# Patient Record
Sex: Male | Born: 1940 | Race: Black or African American | Hispanic: No | Smoking: Former smoker
Health system: Southern US, Community
[De-identification: ages and names within clinical notes are randomized; demographics above are authoritative.]

## PROBLEM LIST (undated history)

## (undated) DIAGNOSIS — E119 Type 2 diabetes mellitus without complications: Secondary | ICD-10-CM

## (undated) DIAGNOSIS — H401190 Primary open-angle glaucoma, unspecified eye, stage unspecified: Secondary | ICD-10-CM

## (undated) DIAGNOSIS — I639 Cerebral infarction, unspecified: Secondary | ICD-10-CM

## (undated) DIAGNOSIS — I10 Essential (primary) hypertension: Secondary | ICD-10-CM

## (undated) DIAGNOSIS — E785 Hyperlipidemia, unspecified: Secondary | ICD-10-CM

## (undated) DIAGNOSIS — N189 Chronic kidney disease, unspecified: Secondary | ICD-10-CM

## (undated) DIAGNOSIS — I4510 Unspecified right bundle-branch block: Secondary | ICD-10-CM

## (undated) DIAGNOSIS — N289 Disorder of kidney and ureter, unspecified: Secondary | ICD-10-CM

## (undated) HISTORY — DX: Chronic kidney disease, unspecified: N18.9

## (undated) HISTORY — DX: Unspecified right bundle-branch block: I45.10

## (undated) HISTORY — DX: Primary open-angle glaucoma, unspecified eye, stage unspecified: H40.1190

## (undated) HISTORY — DX: Hyperlipidemia, unspecified: E78.5

## (undated) HISTORY — DX: Essential (primary) hypertension: I10

## (undated) HISTORY — PX: EYE SURGERY: SHX253

## (undated) HISTORY — DX: Disorder of kidney and ureter, unspecified: N28.9

## (undated) HISTORY — DX: Type 2 diabetes mellitus without complications: E11.9

## (undated) HISTORY — DX: Cerebral infarction, unspecified: I63.9

---

## 1969-07-19 HISTORY — PX: OTHER SURGICAL HISTORY: SHX169

## 1990-11-18 DIAGNOSIS — E119 Type 2 diabetes mellitus without complications: Secondary | ICD-10-CM

## 1990-11-18 HISTORY — DX: Type 2 diabetes mellitus without complications: E11.9

## 1995-11-19 DIAGNOSIS — I639 Cerebral infarction, unspecified: Secondary | ICD-10-CM

## 1995-11-19 HISTORY — DX: Cerebral infarction, unspecified: I63.9

## 1998-10-18 ENCOUNTER — Encounter: Payer: Self-pay | Admitting: Family Medicine

## 1998-10-18 DIAGNOSIS — I10 Essential (primary) hypertension: Secondary | ICD-10-CM

## 1998-10-18 DIAGNOSIS — E785 Hyperlipidemia, unspecified: Secondary | ICD-10-CM

## 1998-10-18 HISTORY — DX: Essential (primary) hypertension: I10

## 1998-10-18 HISTORY — DX: Hyperlipidemia, unspecified: E78.5

## 1998-10-18 LAB — CONVERTED CEMR LAB
Hgb A1c MFr Bld: 8.8 %
Microalbumin U total vol: 7.8 mg/L

## 1998-12-24 ENCOUNTER — Encounter: Payer: Self-pay | Admitting: Emergency Medicine

## 1998-12-24 ENCOUNTER — Emergency Department (HOSPITAL_COMMUNITY): Admission: EM | Admit: 1998-12-24 | Discharge: 1998-12-24 | Payer: Self-pay | Admitting: Emergency Medicine

## 1999-02-17 ENCOUNTER — Encounter: Payer: Self-pay | Admitting: Family Medicine

## 1999-04-19 ENCOUNTER — Encounter: Payer: Self-pay | Admitting: Family Medicine

## 1999-04-19 LAB — CONVERTED CEMR LAB: Hgb A1c MFr Bld: 7 %

## 1999-09-19 ENCOUNTER — Encounter: Payer: Self-pay | Admitting: Family Medicine

## 1999-12-20 ENCOUNTER — Encounter: Payer: Self-pay | Admitting: Family Medicine

## 2000-01-07 ENCOUNTER — Encounter: Payer: Self-pay | Admitting: Family Medicine

## 2000-01-07 LAB — CONVERTED CEMR LAB: PSA: 2.1 ng/mL

## 2000-09-11 ENCOUNTER — Encounter: Payer: Self-pay | Admitting: Family Medicine

## 2000-09-11 LAB — CONVERTED CEMR LAB: Hgb A1c MFr Bld: 7.2 %

## 2001-03-24 ENCOUNTER — Encounter: Payer: Self-pay | Admitting: Family Medicine

## 2001-03-24 LAB — CONVERTED CEMR LAB
Hgb A1c MFr Bld: 9 %
PSA: 1.1 ng/mL

## 2001-03-26 ENCOUNTER — Encounter: Payer: Self-pay | Admitting: Family Medicine

## 2001-03-26 LAB — CONVERTED CEMR LAB: Microalbumin U total vol: 11.5 mg/L

## 2001-05-07 ENCOUNTER — Encounter: Payer: Self-pay | Admitting: Family Medicine

## 2001-05-07 LAB — CONVERTED CEMR LAB: Hgb A1c MFr Bld: 8 %

## 2001-08-06 ENCOUNTER — Encounter: Payer: Self-pay | Admitting: Family Medicine

## 2002-01-21 ENCOUNTER — Encounter: Payer: Self-pay | Admitting: Family Medicine

## 2002-01-21 LAB — CONVERTED CEMR LAB: Hgb A1c MFr Bld: 5.9 %

## 2002-04-08 ENCOUNTER — Encounter: Payer: Self-pay | Admitting: Family Medicine

## 2002-04-08 LAB — CONVERTED CEMR LAB
Hgb A1c MFr Bld: 6 %
Microalbumin U total vol: 10.5 mg/L
PSA: 1.6 ng/mL

## 2002-10-11 ENCOUNTER — Encounter: Payer: Self-pay | Admitting: Family Medicine

## 2003-08-16 ENCOUNTER — Encounter: Payer: Self-pay | Admitting: Family Medicine

## 2004-04-11 ENCOUNTER — Encounter: Payer: Self-pay | Admitting: Family Medicine

## 2004-04-11 LAB — CONVERTED CEMR LAB: Hgb A1c MFr Bld: 8 %

## 2004-10-03 ENCOUNTER — Ambulatory Visit: Payer: Self-pay | Admitting: Family Medicine

## 2004-10-03 LAB — CONVERTED CEMR LAB: Hgb A1c MFr Bld: 11.5 %

## 2004-10-04 ENCOUNTER — Encounter: Payer: Self-pay | Admitting: Family Medicine

## 2004-10-04 LAB — CONVERTED CEMR LAB: PSA: 1.7 ng/mL

## 2006-07-30 ENCOUNTER — Ambulatory Visit: Payer: Self-pay | Admitting: Family Medicine

## 2006-07-30 LAB — CONVERTED CEMR LAB: Microalbumin U total vol: 14 mg/L

## 2006-07-31 ENCOUNTER — Encounter: Payer: Self-pay | Admitting: Family Medicine

## 2006-07-31 LAB — CONVERTED CEMR LAB
Hgb A1c MFr Bld: 8.1 %
PSA: 3.4 ng/mL

## 2007-02-04 ENCOUNTER — Ambulatory Visit: Payer: Self-pay | Admitting: Family Medicine

## 2007-02-04 LAB — CONVERTED CEMR LAB: Hgb A1c MFr Bld: 9.4 %

## 2007-03-06 ENCOUNTER — Ambulatory Visit: Payer: Self-pay | Admitting: Family Medicine

## 2007-05-04 ENCOUNTER — Encounter: Payer: Self-pay | Admitting: Family Medicine

## 2007-05-04 ENCOUNTER — Ambulatory Visit: Payer: Self-pay | Admitting: Family Medicine

## 2007-05-04 DIAGNOSIS — N3946 Mixed incontinence: Secondary | ICD-10-CM

## 2007-05-04 DIAGNOSIS — E118 Type 2 diabetes mellitus with unspecified complications: Secondary | ICD-10-CM

## 2007-05-04 DIAGNOSIS — E785 Hyperlipidemia, unspecified: Secondary | ICD-10-CM

## 2007-05-04 DIAGNOSIS — I10 Essential (primary) hypertension: Secondary | ICD-10-CM | POA: Insufficient documentation

## 2007-05-04 DIAGNOSIS — Z87891 Personal history of nicotine dependence: Secondary | ICD-10-CM | POA: Insufficient documentation

## 2007-05-04 DIAGNOSIS — E1165 Type 2 diabetes mellitus with hyperglycemia: Secondary | ICD-10-CM

## 2007-05-05 ENCOUNTER — Encounter: Payer: Self-pay | Admitting: Family Medicine

## 2007-05-06 ENCOUNTER — Ambulatory Visit: Payer: Self-pay | Admitting: Family Medicine

## 2007-08-03 ENCOUNTER — Encounter (INDEPENDENT_AMBULATORY_CARE_PROVIDER_SITE_OTHER): Payer: Self-pay | Admitting: *Deleted

## 2007-08-25 ENCOUNTER — Ambulatory Visit: Payer: Self-pay | Admitting: Family Medicine

## 2007-08-27 ENCOUNTER — Ambulatory Visit: Payer: Self-pay | Admitting: Family Medicine

## 2007-10-28 ENCOUNTER — Ambulatory Visit: Payer: Self-pay | Admitting: Family Medicine

## 2007-10-28 LAB — CONVERTED CEMR LAB
ALT: 39 units/L (ref 0–53)
AST: 32 units/L (ref 0–37)

## 2007-12-30 ENCOUNTER — Ambulatory Visit: Payer: Self-pay | Admitting: Family Medicine

## 2007-12-30 LAB — CONVERTED CEMR LAB
ALT: 39 units/L (ref 0–53)
AST: 37 units/L (ref 0–37)
Cholesterol: 118 mg/dL (ref 0–200)
Total CHOL/HDL Ratio: 2.6

## 2008-01-04 ENCOUNTER — Ambulatory Visit: Payer: Self-pay | Admitting: Family Medicine

## 2008-01-04 DIAGNOSIS — H6120 Impacted cerumen, unspecified ear: Secondary | ICD-10-CM

## 2008-04-20 ENCOUNTER — Telehealth: Payer: Self-pay | Admitting: Family Medicine

## 2008-08-29 ENCOUNTER — Ambulatory Visit: Payer: Self-pay | Admitting: Family Medicine

## 2008-08-30 LAB — CONVERTED CEMR LAB
AST: 32 units/L (ref 0–37)
Albumin: 3.4 g/dL — ABNORMAL LOW (ref 3.5–5.2)
Alkaline Phosphatase: 165 units/L — ABNORMAL HIGH (ref 39–117)
BUN: 14 mg/dL (ref 6–23)
Basophils Absolute: 0.1 10*3/uL (ref 0.0–0.1)
Bilirubin, Direct: 0.1 mg/dL (ref 0.0–0.3)
Chloride: 103 meq/L (ref 96–112)
Eosinophils Absolute: 0.3 10*3/uL (ref 0.0–0.7)
Eosinophils Relative: 4.3 % (ref 0.0–5.0)
GFR calc Af Amer: 86 mL/min
GFR calc non Af Amer: 71 mL/min
HCT: 47.1 % (ref 39.0–52.0)
HDL: 43.9 mg/dL (ref >39.0)
Hgb A1c MFr Bld: 7.5 % — ABNORMAL HIGH (ref 4.6–6.0)
MCHC: 33.3 g/dL (ref 30.0–36.0)
MCV: 90.6 fL (ref 78.0–100.0)
Microalb Creat Ratio: 43.1 mg/g — ABNORMAL HIGH (ref 0.0–30.0)
Monocytes Absolute: 0.9 10*3/uL (ref 0.1–1.0)
Neutrophils Relative %: 47.9 % (ref 43.0–77.0)
PSA: 1.99 ng/mL (ref 0.10–4.00)
Platelets: 208 10*3/uL (ref 150–400)
Potassium: 3.4 meq/L — ABNORMAL LOW (ref 3.5–5.1)
RDW: 15.7 % — ABNORMAL HIGH (ref 11.5–14.6)
Sodium: 143 meq/L (ref 135–145)
Total Bilirubin: 1 mg/dL (ref 0.3–1.2)
Triglycerides: 50 mg/dL (ref 0–149)
VLDL: 10 mg/dL (ref 0–40)
WBC: 6.4 10*3/uL (ref 4.5–10.5)

## 2008-09-05 ENCOUNTER — Ambulatory Visit: Payer: Self-pay | Admitting: Family Medicine

## 2008-11-02 ENCOUNTER — Telehealth: Payer: Self-pay | Admitting: Family Medicine

## 2009-01-26 ENCOUNTER — Ambulatory Visit: Payer: Self-pay | Admitting: Family Medicine

## 2009-01-30 LAB — CONVERTED CEMR LAB
ALT: 31 units/L (ref 0–53)
AST: 34 units/L (ref 0–37)
Alkaline Phosphatase: 160 units/L — ABNORMAL HIGH (ref 39–117)
Basophils Absolute: 0 10*3/uL (ref 0.0–0.1)
CO2: 30 meq/L (ref 19–32)
Chloride: 104 meq/L (ref 96–112)
Eosinophils Absolute: 0.2 10*3/uL (ref 0.0–0.7)
Eosinophils Relative: 3.9 % (ref 0.0–5.0)
GFR calc non Af Amer: 59 mL/min
Lymphocytes Relative: 27.8 % (ref 12.0–46.0)
MCHC: 34 g/dL (ref 30.0–36.0)
MCV: 87.9 fL (ref 78.0–100.0)
Neutrophils Relative %: 57 % (ref 43.0–77.0)
Platelets: 203 10*3/uL (ref 150–400)
Potassium: 3.4 meq/L — ABNORMAL LOW (ref 3.5–5.1)
RBC: 5.16 M/uL (ref 4.22–5.81)
Total Bilirubin: 1 mg/dL (ref 0.3–1.2)
WBC: 6.2 10*3/uL (ref 4.5–10.5)

## 2009-03-01 ENCOUNTER — Ambulatory Visit: Payer: Self-pay | Admitting: Family Medicine

## 2009-03-01 LAB — CONVERTED CEMR LAB
CO2: 30 meq/L (ref 19–32)
Chloride: 103 meq/L (ref 96–112)
Glucose, Bld: 165 mg/dL — ABNORMAL HIGH (ref 70–99)
Potassium: 3.2 meq/L — ABNORMAL LOW (ref 3.5–5.1)
Sodium: 140 meq/L (ref 135–145)

## 2009-03-07 ENCOUNTER — Ambulatory Visit: Payer: Self-pay | Admitting: Family Medicine

## 2009-03-29 ENCOUNTER — Telehealth: Payer: Self-pay | Admitting: Family Medicine

## 2009-03-30 ENCOUNTER — Ambulatory Visit: Payer: Self-pay | Admitting: Family Medicine

## 2009-03-30 DIAGNOSIS — K219 Gastro-esophageal reflux disease without esophagitis: Secondary | ICD-10-CM

## 2009-04-06 ENCOUNTER — Ambulatory Visit: Payer: Self-pay | Admitting: Family Medicine

## 2009-04-06 LAB — CONVERTED CEMR LAB
BUN: 14 mg/dL (ref 6–23)
CO2: 31 meq/L (ref 19–32)
Chloride: 101 meq/L (ref 96–112)
Glucose, Bld: 116 mg/dL — ABNORMAL HIGH (ref 70–99)
Potassium: 3.3 meq/L — ABNORMAL LOW (ref 3.5–5.1)

## 2009-04-13 ENCOUNTER — Ambulatory Visit: Payer: Self-pay | Admitting: Family Medicine

## 2009-04-13 DIAGNOSIS — N289 Disorder of kidney and ureter, unspecified: Secondary | ICD-10-CM | POA: Insufficient documentation

## 2009-04-25 ENCOUNTER — Telehealth: Payer: Self-pay | Admitting: Family Medicine

## 2009-06-23 ENCOUNTER — Ambulatory Visit: Payer: Self-pay | Admitting: Family Medicine

## 2009-06-23 ENCOUNTER — Encounter: Admission: RE | Admit: 2009-06-23 | Discharge: 2009-06-23 | Payer: Self-pay | Admitting: Family Medicine

## 2009-07-06 ENCOUNTER — Telehealth: Payer: Self-pay | Admitting: Family Medicine

## 2009-07-26 ENCOUNTER — Ambulatory Visit: Payer: Self-pay | Admitting: Family Medicine

## 2009-09-04 ENCOUNTER — Ambulatory Visit: Payer: Self-pay | Admitting: Family Medicine

## 2009-09-04 LAB — CONVERTED CEMR LAB
BUN: 16 mg/dL (ref 6–23)
CO2: 28 meq/L (ref 19–32)
Calcium: 8.8 mg/dL (ref 8.4–10.5)
Creatinine, Ser: 1.3 mg/dL (ref 0.4–1.5)
Glucose, Bld: 131 mg/dL — ABNORMAL HIGH (ref 70–99)
Sodium: 143 meq/L (ref 135–145)

## 2009-09-06 ENCOUNTER — Ambulatory Visit: Payer: Self-pay | Admitting: Family Medicine

## 2010-01-22 ENCOUNTER — Ambulatory Visit: Payer: Self-pay | Admitting: Family Medicine

## 2010-01-23 ENCOUNTER — Telehealth: Payer: Self-pay | Admitting: Family Medicine

## 2010-01-23 ENCOUNTER — Emergency Department (HOSPITAL_COMMUNITY): Admission: EM | Admit: 2010-01-23 | Discharge: 2010-01-23 | Payer: Self-pay | Admitting: Emergency Medicine

## 2010-01-25 ENCOUNTER — Telehealth: Payer: Self-pay | Admitting: Family Medicine

## 2010-02-28 ENCOUNTER — Encounter (INDEPENDENT_AMBULATORY_CARE_PROVIDER_SITE_OTHER): Payer: Self-pay | Admitting: *Deleted

## 2010-03-06 ENCOUNTER — Ambulatory Visit: Payer: Self-pay | Admitting: Family Medicine

## 2010-06-19 ENCOUNTER — Ambulatory Visit: Payer: Self-pay | Admitting: Family Medicine

## 2010-06-21 ENCOUNTER — Ambulatory Visit: Payer: Self-pay | Admitting: Family Medicine

## 2010-06-22 LAB — CONVERTED CEMR LAB
BUN: 17 mg/dL (ref 6–23)
Bilirubin, Direct: 0.2 mg/dL (ref 0.0–0.3)
Chloride: 101 meq/L (ref 96–112)
Cholesterol: 121 mg/dL (ref 0–200)
LDL Cholesterol: 61 mg/dL (ref 0–99)
Potassium: 3.9 meq/L (ref 3.5–5.1)
Total CHOL/HDL Ratio: 2
Total Protein: 6.9 g/dL (ref 6.0–8.3)
VLDL: 6.6 mg/dL (ref 0.0–40.0)

## 2010-09-10 ENCOUNTER — Ambulatory Visit: Payer: Self-pay | Admitting: Family Medicine

## 2010-09-10 DIAGNOSIS — B351 Tinea unguium: Secondary | ICD-10-CM

## 2010-09-10 LAB — CONVERTED CEMR LAB: PSA: 2.53 ng/mL (ref 0.10–4.00)

## 2010-12-18 NOTE — Letter (Signed)
Summary: [place] No Show Letter  [place] [facility]  [address] [place], [place] [address]   Phone: [phone]  Fax: [phone]    02/28/2010 MRN: [id number]  JASH WAHLEN [address] [place], [place]  [address]   Dear Mr. VANWAGONER,   Our records indicate that you missed your scheduled appointment with ________lab_____________ on ____4/12/11________.  Please contact this office to reschedule your appointment as soon as possible.  It is important that you keep your scheduled appointments with your physician, so we can provide you the best care possible.  Please be advised that there may be a charge for "no show" appointments.    Sincerely,   [place] at [facility]

## 2010-12-18 NOTE — Progress Notes (Signed)
Summary: blood sugar  Phone Note Call from Patient Call back at [phone]   Caller: Arnetta Call For: Shaune Leeks MD Summary of Call: Patient's niece says that she has been checking his blood sugar as often as she can. She says that on Wed. it was 109, and then later that day it was 201. She says that last night it was at 46 and patient fell again. She said that she checked it this morning at 3:30 at it was 36, then she gave hims some orange juice and koolaid and rechecked it at 4:27 and it was 152. She wants to know what she needs to do about his blood sugar being in the 30's. Please advise.  Initial call taken by: Melody Comas,  January 25, 2010 9:51 AM  Follow-up for Phone Call        Decrease nighttime dose of insulin to 30 units from 35. Give 3-4 days. If still getting low, decrease to 25. Keep appt in middle of April. Call if further problems. Follow-up by: Shaune Leeks MD,  January 25, 2010 11:07 AM  Additional Follow-up for Phone Call Additional follow up Details #1::        Advised pt's neice. Additional Follow-up by: Lowella Petties CMA,  January 25, 2010 11:23 AM    New/Updated Medications: NOVOLIN 70/30   SUSP (INSULIN ISOPHANE & REG (HUMAN) SUSP) 40 UNITS AM/ 30 UNITS PM

## 2010-12-18 NOTE — Assessment & Plan Note (Signed)
Summary: FALLING/CLE   Vital Signs:  Patient profile:   70 year old male Weight:      204.75 pounds Temp:     99.2 degrees F oral Pulse rate:   84 / minute Pulse rhythm:   regular BP sitting:   158 / 70  (left arm) Cuff size:   large  Vitals Entered By: Sydell Axon LPN ([birth year]  [birth year] 11:36 AM) CC: Has fallen 3 times, does not remember falling until he is on the floor, this started after going to the dentist the other day and being put to sleep for some dental work.   History of Present Illness: Marcus Downs is a 70 y/o [place] American male who presents today with a three day history of falls.  He does not have any memory of falling but does remember being on the floor and has needed assistance getting back up.  On 2/22, the patient went to dentist for dental work and has been eating less since.  His family is concerned that he might be experiencing hypoglycemic episodes.  He is unable to monitor his glucose regularly becasue he doesn not have a machine, but he has inquired about getting a prescription for one.  The patient has left sided weakness due to a stroke in 1997 and wears an AFO on his left foot.  When moving around his home he relies on a cane and when outside of his home uses a wheelchair.  Marcus Downs also complains of three days of constipation.  He has only been able to pass gas but not stool.    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 1 Tablet By Mouth Once A Day 2)  Hyzaar 100-25 Mg Tabs (Losartan Potassium-Hctz) .Marland Kitchen.. 1 Tablet By Mouth Once A Day 3)  Novolin 70/30   Susp (Insulin Isophane & Reg (Human) Susp) .... 45 Units Am/ 40 Units Pm 4)  Cardizem La 300 Mg  Tb24 (Diltiazem Hcl Coated Beads) .... Take One By Mouth Once A Day 5)  Caduet 5-80 Mg  Tabs (Amlodipine-Atorvastatin) .... One Tab By Mouth Hs 6)  Voltaren 1 % Gel (Diclofenac Sodium) .... Apply To Knee Two Times A Day. 7)  Kaon-Cl-10 10 Meq Cr-Tabs (Potassium Chloride) .... One Tab By Mouth Twice A Day. 8)  Prilosec 40 Mg Cpdr (Omeprazole) .... One Tab By Mouth 45 Mins Before Brfst. 9)  Dilt-Cd 300 Mg Xr24h-Cap (Diltiazem Hcl Coated Beads) .Marland Kitchen.. 1 Daily By Mouth 10)  Hydrocodone-Acetaminophen 5-500 Mg Tabs (Hydrocodone-Acetaminophen) .Marland Kitchen.. 1 By Mouth Every 4 Hours As Needed For Pain  Allergies: 1)  ! * Plastic Tape  Physical Exam  General:  Well-developed,well-nourished,in no acute distress; alert,appropriate and cooperative throughout examination Head:  Normocephalic and atraumatic without obvious abnormalities. No apparent alopecia but  balding. Eyes:  Conjunctiva clear bilaterally.  Ears:  External ear exam shows no significant lesions or deformities.  Otoscopic examination reveals clear canals, tympanic membranes  are intact bilaterally without bulging, retraction, inflammation or discharge. Hearing is grossly normal bilaterally. Minimal cerumen bilat, left>right. Nose:  External nasal examination shows no deformity or inflammation. Nasal mucosa are pink and moist without lesions or exudates. Mouth:  Oral mucosa and oropharynx without lesions or exudates.  Teeth in poor  repair. Neck:  No deformities, masses, or tenderness noted. Lungs:  Normal respiratory effort, chest expands symmetrically. Lungs are clear to auscultation, no crackles or wheezes. Heart:  Normal rate and regular rhythm. S1 and S2 normal  without gallop, murmur, click, rub or other extra sounds.   Impression & Recommendations:  Problem # 1:  DIABETES MELLITUS, TYPE II (ICD-250.00)  Thinkfrom presentation that his situation is due to overcaontrol of a opreviously well controlled pt whio is now not eating well due to teeth problems. Will arbitrarily decrease insulin and try to get some nos. by other family members.   See instructions. His updated medication list for this problem includes:    Actos 30 Mg Tabs (Pioglitazone hcl) .Marland Kitchen... 1 tablet by mouth once a day    Hyzaar 100-25 Mg Tabs (Losartan potassium-hctz) .Marland Kitchen... 1 tablet by mouth once a day    Novolin 70/30 Susp (Insulin isophane & reg (human) susp) .Marland KitchenMarland KitchenMarland KitchenMarland Kitchen 40 units am/ 35 units pm  Labs Reviewed: Creat: 1.3 (09/04/2009)   Microalbumin: 14 (07/30/2006)  Last Eye Exam: glaucoma (06/28/2008) Reviewed HgBA1c results: 5.9 (09/04/2009)  7.0 (03/01/2009)  Problem # 2:  HYPERTENSION (ICD-401.9) Assessment: Deteriorated Elevated today. Will follow. Has been ok in the past. His updated medication list for this problem includes:    Hyzaar 100-25 Mg Tabs (Losartan potassium-hctz) .Marland Kitchen... 1 tablet by mouth once a day    Cardizem La 300 Mg Tb24 (Diltiazem hcl coated beads) .Marland Kitchen... Take one by mouth once a day    Caduet 5-80 Mg Tabs (Amlodipine-atorvastatin) ..... One tab by mouth hs    Dilt-cd 300 Mg Xr24h-cap (Diltiazem hcl coated beads) .Marland Kitchen... 1 daily by mouth  BP today: 158/70 Prior BP: 130/84 (09/06/2009)  Labs Reviewed: K+: 3.4 (09/04/2009) Creat: : 1.3 (09/04/2009)   Chol: 123 (08/29/2008)   HDL: 43.9 (08/29/2008)   LDL: 69 (08/29/2008)   TG: 50 (08/29/2008)  Complete Medication List: 1)  Actos 30 Mg Tabs (Pioglitazone hcl) .Marland Kitchen.. 1 tablet by mouth once a day 2)  Hyzaar 100-25 Mg Tabs (Losartan potassium-hctz) .Marland Kitchen.. 1 tablet by mouth once a day 3)  Novolin 70/30 Susp (Insulin isophane & reg (human) susp) .... 40 units am/ 35 units pm 4)  Cardizem La 300 Mg Tb24 (Diltiazem  hcl coated beads) .... Take one by mouth once a day 5)  Caduet 5-80 Mg Tabs (Amlodipine-atorvastatin) .... One tab by mouth hs 6)  Voltaren 1 % Gel (Diclofenac sodium) .... Apply to knee two times a day. 7)  Kaon-cl-10 10 Meq Cr-tabs (Potassium chloride) .... One tab by mouth twice a day. 8)  Prilosec 40 Mg Cpdr (Omeprazole) .... One tab by mouth 45 mins before brfst. 9)  Dilt-cd 300 Mg Xr24h-cap (Diltiazem hcl coated beads) .Marland Kitchen.. 1 daily by mouth 10)  Hydrocodone-acetaminophen 5-500 Mg Tabs (Hydrocodone-acetaminophen) .Marland Kitchen.. 1 by mouth every 4 hours as needed for pain  Patient Instructions: 1)  Go to 40 in AM, 35 at night. 2)  Call with nos as able in one  week.  Current Allergies (reviewed today): ! * PLASTIC TAPE

## 2010-12-18 NOTE — Assessment & Plan Note (Signed)
Summary: cpx/dlo   Vital Signs:  Patient profile:   70 year old male Temp:     98.0 degrees F oral Pulse rate:   76 / minute Pulse rhythm:   regular BP sitting:   138 / 60  (right arm) Cuff size:   large  Vitals Entered By: Selena Batten Dance CMA Duncan Dull) (September 10, 2010 9:27 AM) CC: CPx Comments Unable to weigh due to wheelchair   History of Present Illness: CC: CPE  Presents with nephew Christiane Ha.  here for CPE.  concerned about actos in news and bladder cancer.  Declines colon screening today.  requests prostate check today.  due for flu shot.  last blood work was 2 mo ago.  A1c 7.7%.  DM - hasn't checked sugars since July.  Niece could come by and check.  thinks doing fine, no more lows.  resistant to med changes.  no smoking, no fmhx bladder cancer.  no blood in urine.  vision screen 2 wks ago.  hasn't seen foot doctor for diabetes. HTN - best in a while.  pt feels this level is sufficient, doesn't want change in meds.  no HA, CP/tightness. HLD - good control on current regimen.  no myalgias.  Current Medications (verified): 1)  Actos 30 Mg Tabs (Pioglitazone Hcl) .Marland Kitchen.. 1 Tablet By Mouth Once A Day 2)  Dilt-Cd 300 Mg Xr24h-Cap (Diltiazem Hcl Coated Beads) .Marland Kitchen.. 1 Daily By Mouth 3)  Hyzaar 100-25 Mg Tabs (Losartan Potassium-Hctz) .Marland Kitchen.. 1 Tablet By Mouth Once A Day 4)  Novolog Mix 70/30 70-30 % Susp (Insulin Aspart Prot & Aspart) .... 25units in Am, 20units in Pm, Qs 1 Month 5)  Caduet 5-80 Mg  Tabs (Amlodipine-Atorvastatin) .... One Tab By Mouth Hs 6)  Klor-Con 10 10 Meq Cr-Tabs (Potassium Chloride) .... 2 By Mouth Once Daily  Allergies: 1)  ! * Plastic Tape  Past History:  Past Surgical History: Last updated: 05/04/2007 LIPOMA REMOVED , RIGHT SHOULDER:(1970'S) RIGHT CVA WITH LEFT HEMIPLEGIA [facility]--:(09/15-18/1997)  Past Medical History: Diabetes mellitus, type II: 1992 Hyperlipidemia: 10/1998 Hypertension:1992 h/o CRI R CVA with residual LUE  weakness PMH-FH-SH reviewed for  relevance  Social History: Marital Status: Married: DIVORCED SINCE 1982 Children: 2 CHILDREN Occupation: RETIRED MEDICALLY FROM [employer]  Review of Systems       per HPI  Physical Exam  General:  Well-developed,well-nourished,in no acute distress; alert,appropriate and cooperative throughout examination Head:  Normocephalic and atraumatic without obvious abnormalities. No apparent alopecia but  balding. Eyes:  Conjunctiva clear bilaterally.  Ears:  cerumen bilaterally. Nose:  External nasal examination shows no deformity or inflammation. Nasal mucosa are pink and moist without lesions or exudates. Mouth:  Oral mucosa and oropharynx without lesions or exudates.  dentures in. Neck:  No deformities, masses, or tenderness noted. Lungs:  Normal respiratory effort, chest expands symmetrically. Lungs are clear to auscultation, no crackles or wheezes. Heart:  Normal rate and regular rhythm. S1 and S2 normal without gallop, murmur, click, rub or other extra sounds. Abdomen:  Bowel sounds positive,abdomen soft and non-tender without masses, organomegaly or hernias noted. mildly protuberant. Rectal:  No external abnormalities noted. Normal sphincter tone. No rectal masses or tenderness. G neg. Prostate:  Prostate gland firm and smooth, no enlargement, nodularity, tenderness, mass, asymmetry or induration. 40gms. Msk:  L AFO brace Pulses:  2+ rad pulses Extremities:  mild edema Neurologic:  Residual hemiparesis:  LUE hemiparesis, LLE less weakness, R side WNL Skin:  dry skin throughout Psych:  full affect.  Diabetes  Management Exam:    Foot Exam (with socks and/or shoes not present):       Sensory-Pinprick/Light touch:          Left medial foot (L-4): diminished          Left dorsal foot (L-5): diminished          Left lateral foot (S-1): diminished          Right medial foot (L-4): diminished          Right dorsal foot (L-5): diminished          Right lateral foot (S-1):  diminished       Inspection:          Left foot: abnormal             Comments: dry skin throughout          Right foot: abnormal             Comments: dry skin throughout       Nails:          Left foot: thickened          Right foot: thickened/dystrophic    Eye Exam:       Eye Exam done elsewhere          Date: 08/27/2010   Impression & Recommendations:  Problem # 1:  HEALTH MAINTENANCE EXAM (ICD-V70.0) Reviewed preventive care protocols, scheduled due services, and updated immunizations.  utd Tdap.  flu today.  utd pneumovax.  Problem # 2:  SPECIAL SCREENING FOR MALIGNANT NEOPLASMS COLON (ICD-V76.51) Assessment: Unchanged hemoccult negative today.  pt declines iFOB.  Problem # 3:  SCREENING FOR MALIGNANT NEOPLASM, PROSTATE (ICD-V76.44) DRE reassuring.  await PSA. Orders: TLB-PSA (Prostate Specific Antigen) ([id number])  Problem # 4:  DIABETES MELLITUS, TYPE II (ICD-250.00) resistant to change.  return next visit with logs of postprandial sugars, check A1c.  if elevated, slowly titrate insulin up.  only eats 2 meals a day with 1-2 snacks.  refer to podiatrist given foot exam findings today.  needs dystrophic nails addressed, needs evaluation for chronic tinea pedis, needs education on foot care.  His updated medication list for this problem includes:    Actos 30 Mg Tabs (Pioglitazone hcl) .Marland Kitchen... 1 tablet by mouth once a day    Hyzaar 100-25 Mg Tabs (Losartan potassium-hctz) .Marland Kitchen... 1 tablet by mouth once a day    Novolog Mix 70/30 70-30 % Susp (Insulin aspart prot & aspart) .Marland Kitchen... 25units in am, 20units in pm, qs 1 month  Labs Reviewed: Creat: 1.3 (06/21/2010)   Microalbumin: 14 (07/30/2006)  Last Eye Exam: glaucoma (06/28/2008) Reviewed HgBA1c results: 7.7 (06/21/2010)  5.9 (09/04/2009)  Orders: Podiatry Referral (Podiatry)  Problem # 5:  HYPERLIPIDEMIA (ICD-272.4) optimal control.   His updated medication list for this problem includes:    Caduet 5-80 Mg Tabs  (Amlodipine-atorvastatin) ..... One tab by mouth hs  Labs Reviewed: SGOT: 27 (06/21/2010)   SGPT: 22 (06/21/2010)   HDL:53.90 (06/21/2010), 43.9 (08/29/2008)  LDL:61 (06/21/2010), 69 (08/29/2008)  Chol:121 (06/21/2010), 123 (08/29/2008)  Trig:33.0 (06/21/2010), 50 (08/29/2008)  Problem # 6:  HYPERTENSION (ICD-401.9) improvement since last visit.  pt resistant to med changes.  consider increasing amlodipine.  max dose ARB.  His updated medication list for this problem includes:    Dilt-cd 300 Mg Xr24h-cap (Diltiazem hcl coated beads) .Marland Kitchen... 1 daily by mouth    Hyzaar 100-25 Mg Tabs (Losartan potassium-hctz) .Marland Kitchen... 1 tablet by mouth once a day  Caduet 5-80 Mg Tabs (Amlodipine-atorvastatin) ..... One tab by mouth hs  BP today: 138/60 Prior BP: 160/60 (06/19/2010)  Labs Reviewed: K+: 3.9 (06/21/2010) Creat: : 1.3 (06/21/2010)   Chol: 121 (06/21/2010)   HDL: 53.90 (06/21/2010)   LDL: 61 (06/21/2010)   TG: 33.0 (06/21/2010)  Problem # 7:  RENAL INSUFFICIENCY (ICD-588.9) seems resolved.  Cr 1.3.  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 Orders: Podiatry Referral (Podiatry)  Complete Medication List: 1)  Actos 30 Mg Tabs (Pioglitazone hcl) .Marland Kitchen.. 1 tablet by mouth once a day 2)  Dilt-cd 300 Mg Xr24h-cap (Diltiazem hcl coated beads) .Marland Kitchen.. 1 daily by mouth 3)  Hyzaar 100-25 Mg Tabs (Losartan potassium-hctz) .Marland Kitchen.. 1 tablet by mouth once a day 4)  Novolog Mix 70/30 70-30 % Susp (Insulin aspart prot & aspart) .... 25units in am, 20units in pm, qs 1 month 5)  Caduet 5-80 Mg Tabs (Amlodipine-atorvastatin) .... One tab by mouth hs 6)  Klor-con 10 10 Meq Cr-tabs (Potassium chloride) .... 2 by mouth once daily  Other Orders: Flu Vaccine 28yrs + (16109) Admin 1st Vaccine (60454)  Patient Instructions: 1)  return in 3 months for diabetes followup.   2)  Have your niece check your sugar every so often in the evenings, 2 hours after eating.(Fridays around 8pm).  A few times a  month if able, write down for me. 3)  Prostate checked today. 4)  Flu shot today. 5)  Pass by [facility]'s office for foot doctor referral   Orders Added: 1)  TLB-PSA (Prostate Specific Antigen) [[id number]] 2)  Podiatry Referral [Podiatry] 3)  Flu Vaccine 64yrs + [90658] 4)  Admin 1st Vaccine [90471] 5)  Est. Patient Level IV [78295]   Immunizations Administered:  Influenza Vaccine # 1:    Vaccine Type: Fluvax 3+    Site: right deltoid    Mfr: GlaxoSmithKline    Dose: 0.5 ml    Route: IM    Given by: Selena Batten Dance CMA (AAMA)    Exp. Date: 05/18/2011    Lot #: [id number]    VIS given: 06/12/10 version given September 10, 2010.  Flu Vaccine Consent Questions:    Do you have a history of severe allergic reactions to this vaccine? no    Any prior history of allergic reactions to egg and/or gelatin? no    Do you have a sensitivity to the preservative Thimersol? no    Do you have a past history of Guillan-Barre Syndrome? no    Do you currently have an acute febrile illness? no    Have you ever had a severe reaction to latex? no    Vaccine information given and explained to patient? yes   Immunizations Administered:  Influenza Vaccine # 1:    Vaccine Type: Fluvax 3+    Site: right deltoid    Mfr: GlaxoSmithKline    Dose: 0.5 ml    Route: IM    Given by: Selena Batten Dance CMA (AAMA)    Exp. Date: 05/18/2011    Lot #: [id number]    VIS given: 06/12/10 version given September 10, 2010.  Current Allergies (reviewed today): ! * PLASTIC TAPE  Prevention & Chronic Care Immunizations   Influenza vaccine: Fluvax 3+  (09/10/2010)    Tetanus booster: 01/26/2009: Tdap   Tetanus booster due: 01/27/2019    Pneumococcal vaccine: Pneumovax  (06/19/2010)   Pneumococcal vaccine deferral: Not indicated  (09/10/2010)    H. zoster vaccine: Not documented  Colorectal Screening   Hemoccult: Not documented  Colonoscopy: Not documented  Other Screening   PSA: 2.03  (01/26/2009)   PSA  ordered.   Smoking status: quit  (05/04/2007)  Diabetes Mellitus   HgbA1C: 7.7  (06/21/2010)    Eye exam: glaucoma  (06/28/2008)    Foot exam: yes  (09/10/2010)   High risk foot: Not documented   Foot care education: Not documented    Urine microalbumin/creatinine ratio: 43.1  (08/29/2008)    Diabetes flowsheet reviewed?: Yes   Progress toward A1C goal: Unchanged  Lipids   Total Cholesterol: 121  (06/21/2010)   LDL: 61  (06/21/2010)   LDL Direct: Not documented   HDL: 53.90  (06/21/2010)   Triglycerides: 33.0  (06/21/2010)    SGOT (AST): 27  (06/21/2010)   SGPT (ALT): 22  (06/21/2010)   Alkaline phosphatase: 123  (06/21/2010)   Total bilirubin: 0.9  (06/21/2010)    Lipid flowsheet reviewed?: Yes   Progress toward LDL goal: At goal  Hypertension   Last Blood Pressure: 138 / 60  (09/10/2010)   Serum creatinine: 1.3  (06/21/2010)   Serum potassium 3.9  (06/21/2010)    Hypertension flowsheet reviewed?: Yes   Progress toward BP goal: Improved  Self-Management Support :   Personal Goals (by the next clinic visit) :     Personal A1C goal: 7  (09/10/2010)     Personal blood pressure goal: 130/80  (09/10/2010)     Personal LDL goal: 70  (09/10/2010)    Diabetes self-management support: Not documented    Hypertension self-management support: Not documented    Lipid self-management support: Not documented     Appended Document: cpx/dlo    Clinical Lists Changes  Orders: Added new Service order of Prostate / PSA (Medicare) (G0103) - Signed Added new Service order of DRE (G0102) - Signed Added new Service order of Hemoccult Guaiac-1 spec.(in office) (82270) - Signed

## 2010-12-18 NOTE — Progress Notes (Signed)
Summary: regarding insulin  Phone Note Call from Patient Call back at [phone]   Caller: Son Summary of Call: Pts son states that all of the pt's teeth have been pulled, he has no teeth now and is on a soft food diet.  He is concerned that pt's blood sugar dropped to 43 last night and thinks that pt's insulin dose should be reduced, since he is not eating as much as before.  He is also asking for a script for a glucometer so that pt can check his blood sugars. Initial call taken by: Lowella Petties CMA,  January 23, 2010 2:46 PM  Follow-up for Phone Call        Just dropped his insulin dose yesterday to 40/35 and gave a written script for a glucometer to him when he was here with his children. If the sugar is still getting low on the new regimen, decrease to 35 units AM/30 units PM. We decided to check his sugar as often as someone could get to the house with the machine written for yesterday. Follow-up by: Shaune Leeks MD,  January 23, 2010 4:58 PM  Additional Follow-up for Phone Call Additional follow up Details #1::        Advised pt's son, Jennette Kettle. Additional Follow-up by: Lowella Petties CMA,  January 23, 2010 5:02 PM

## 2010-12-18 NOTE — Assessment & Plan Note (Signed)
Summary: PT TRANSFER FROM DR SCHALLER/CLE   Vital Signs:  Patient profile:   70 year old male Temp:     98.7 degrees F oral Pulse rate:   80 / minute Pulse rhythm:   regular BP sitting:   160 / 60  (right arm) Cuff size:   large  Vitals Entered By: Selena Batten Dance CMA Duncan Dull) (June 19, 2010 3:30 PM) CC: Transfer from Dr. Hetty Ely Comments Pt. in wheelchair-no weight   History of Present Illness: CC: establish care  presents with niece today.  1. HTN - on hyzaar, dilt, Caduet for blood pressure.  no HA, vision changes, chest pain, tightness, urinary changes, LE swelling.  compliant with meds.  2. DM - actos, Novolog 70/30 25u in am, 20 in pm.    3. HLD - caduet.  Lives alone at home.  Gets around in wheelchair.  Current Medications (verified): 1)  Actos 30 Mg Tabs (Pioglitazone Hcl) .Marland Kitchen.. 1 Tablet By Mouth Once A Day 2)  Dilt-Cd 300 Mg Xr24h-Cap (Diltiazem Hcl Coated Beads) .Marland Kitchen.. 1 Daily By Mouth 3)  Hyzaar 100-25 Mg Tabs (Losartan Potassium-Hctz) .Marland Kitchen.. 1 Tablet By Mouth Once A Day 4)  Novolog Mix 70/30 70-30 % Susp (Insulin Aspart Prot & Aspart) .... 25units in Am, 20units in Pm, Qs 1 Month 5)  Caduet 5-80 Mg  Tabs (Amlodipine-Atorvastatin) .... One Tab By Mouth Hs 6)  Voltaren 1 % Gel (Diclofenac Sodium) .... Apply To Knee Two Times A Day. 7)  Kaon-Cl-10 10 Meq Cr-Tabs (Potassium Chloride) .... Two Tablets Daily  Allergies: 1)  ! * Plastic Tape  Past History:  Past medical, surgical, family and social histories (including risk factors) reviewed for relevance to current acute and chronic problems.  Past Medical History: Diabetes mellitus, type II: 1992 Hyperlipidemia: 10/1998 Hypertension:1992 R CVA with residual LUE  weakness  Past Surgical History: Reviewed history from 05/04/2007 and no changes required. LIPOMA REMOVED , RIGHT SHOULDER:(1970'S) RIGHT CVA WITH LEFT HEMIPLEGIA [facility]--:(09/15-18/1997)  Family History: Reviewed history from 09/05/2008 and no changes  required. Father:DECEASED 77YOA MI  Mother: DECEASED 52YOA MVA SISTER A 37 SISTER A 59 HTN Multip med probs SISTER A 56 Sister dec 50s  PERICARDITIS  CV: + FATHER,MI BP:+ SELF , SISTERS DIABETES: + SELF , SISTERS CANCER: NEGATIVE OTHER + STROKE + SELF , UNCLE  Social History: Reviewed history from 05/04/2007 and no changes required. Marital Status: Married: DIVORCED SINCE 1982 Children: 2 CILDREN Occupation: RETIRED MEDICALLY FROM [employer]  Review of Systems       per HPI  Physical Exam  General:  Well-developed,well-nourished,in no acute distress; alert,appropriate and cooperative throughout examination Lungs:  Normal respiratory effort, chest expands symmetrically. Lungs are clear to auscultation, no crackles or wheezes. Heart:  Normal rate and regular rhythm. S1 and S2 normal without gallop, murmur, click, rub or other extra sounds. Neurologic:  Residual hemiparesis:  LUE hemiparesis, LLE less weakness, R side WNL   Impression & Recommendations:  Problem # 1:  DIABETES MELLITUS, TYPE II (ICD-250.00) check A1c today.  Continue current regimen.  May be able to drop insulin more if A1c <7.  Pt does not check sugars at home. His updated medication list for this problem includes:    Actos 30 Mg Tabs (Pioglitazone hcl) .Marland Kitchen... 1 tablet by mouth once a day    Hyzaar 100-25 Mg Tabs (Losartan potassium-hctz) .Marland Kitchen... 1 tablet by mouth once a day    Novolog Mix 70/30 70-30 % Susp (Insulin aspart prot & aspart) .Marland KitchenMarland KitchenMarland KitchenMarland Kitchen  25units in am, 20units in pm, qs 1 month  Labs Reviewed: Creat: 1.3 (09/04/2009)   Microalbumin: 14 (07/30/2006)  Last Eye Exam: glaucoma (06/28/2008) Reviewed HgBA1c results: 5.9 (09/04/2009)  7.0 (03/01/2009)  Problem # 2:  HYPERLIPIDEMIA (ICD-272.4) check FLP when returns fasting.  Continue current regimen.  Did well on atorvastatin in past. His updated medication list for this problem includes:    Caduet 5-80 Mg Tabs (Amlodipine-atorvastatin) ..... One tab  by mouth hs  Labs Reviewed: SGOT: 34 (01/26/2009)   SGPT: 31 (01/26/2009)   HDL:43.9 (08/29/2008), 46.1 (12/30/2007)  LDL:69 (08/29/2008), 62 (12/30/2007)  Chol:123 (08/29/2008), 118 (12/30/2007)  Trig:50 (08/29/2008), 50 (12/30/2007)  Problem # 3:  HYPERTENSION (ICD-401.9) not optimal control currently.  Would consider increasing amlodipine vs checking renin/aldosterone to see if would benefit from spironolactone.  no change currently, recheck next visit. The following medications were removed from the medication list:    Cardizem La 300 Mg Tb24 (Diltiazem hcl coated beads) .Marland Kitchen... Take one by mouth once a day His updated medication list for this problem includes:    Dilt-cd 300 Mg Xr24h-cap (Diltiazem hcl coated beads) .Marland Kitchen... 1 daily by mouth    Hyzaar 100-25 Mg Tabs (Losartan potassium-hctz) .Marland Kitchen... 1 tablet by mouth once a day    Caduet 5-80 Mg Tabs (Amlodipine-atorvastatin) ..... One tab by mouth hs  BP today: 160/60 Prior BP: 158/70 (01/22/2010)  Labs Reviewed: K+: 3.4 (09/04/2009) Creat: : 1.3 (09/04/2009)   Chol: 123 (08/29/2008)   HDL: 43.9 (08/29/2008)   LDL: 69 (08/29/2008)   TG: 50 (08/29/2008)  Problem # 4:  Preventive Health Care (ICD-V70.0)  advised to return for cpe.  pneumovax today.  due to discuss colon screening and discuss prostate screening.  pt does not like to use phone tree.  Problem # 5:  CVA WITH RIGHT HEMIPARESIS (ICD-438.20) stable.  uses wheelchair.  lives alone.  Complete Medication List: 1)  Actos 30 Mg Tabs (Pioglitazone hcl) .Marland Kitchen.. 1 tablet by mouth once a day 2)  Dilt-cd 300 Mg Xr24h-cap (Diltiazem hcl coated beads) .Marland Kitchen.. 1 daily by mouth 3)  Hyzaar 100-25 Mg Tabs (Losartan potassium-hctz) .Marland Kitchen.. 1 tablet by mouth once a day 4)  Novolog Mix 70/30 70-30 % Susp (Insulin aspart prot & aspart) .... 25units in am, 20units in pm, qs 1 month 5)  Caduet 5-80 Mg Tabs (Amlodipine-atorvastatin) .... One tab by mouth hs 6)  Voltaren 1 % Gel (Diclofenac sodium) ....  Apply to knee two times a day. 7)  Kaon-cl-10 10 Meq Cr-tabs (Potassium chloride) .... Two tablets daily  Other Orders: Pneumococcal Vaccine (40981) Admin 1st Vaccine (19147)  Patient Instructions: 1)  Come back this week in AM fasting (we open at 8am).  We will check blood work then. 2)  [A1c 250.00, FLP 272.4, CMP 250.0] 3)  Return in 3 months for follow up.  Return for complete physical 4)  Pleasure to meet you today! 5)  Continue current meds. 6)  Change to ES tylenol 500mg  for pain.  Current Allergies (reviewed today): ! * PLASTIC TAPE   Prevention & Chronic Care Immunizations   Influenza vaccine: Fluvax 3+  (08/29/2008)    Tetanus booster: 01/26/2009: Tdap   Tetanus booster due: 01/27/2019    Pneumococcal vaccine: Pneumovax  (06/19/2010)    H. zoster vaccine: Not documented  Colorectal Screening   Hemoccult: Not documented    Colonoscopy: Not documented  Other Screening   PSA: 2.03  (01/26/2009)   Smoking status: quit  (05/04/2007)  Diabetes Mellitus  HgbA1C: 5.9  (09/04/2009)    Eye exam: glaucoma  (06/28/2008)    Foot exam: Not documented   High risk foot: Not documented   Foot care education: Not documented    Urine microalbumin/creatinine ratio: 43.1  (08/29/2008)    Diabetes flowsheet reviewed?: Yes   Progress toward A1C goal: At goal  Lipids   Total Cholesterol: 123  (08/29/2008)   LDL: 69  (08/29/2008)   LDL Direct: Not documented   HDL: 43.9  (08/29/2008)   Triglycerides: 50  (08/29/2008)    SGOT (AST): 34  (01/26/2009)   SGPT (ALT): 31  (01/26/2009)   Alkaline phosphatase: 160  (01/26/2009)   Total bilirubin: 1.0  (01/26/2009)  Hypertension   Last Blood Pressure: 160 / 60  (06/19/2010)   Serum creatinine: 1.3  (09/04/2009)   Serum potassium 3.4  (09/04/2009)  Self-Management Support :    Diabetes self-management support: Not documented    Hypertension self-management support: Not documented    Lipid self-management support:  Not documented    Nursing Instructions: Give Pneumovax today     Immunizations Administered:  Pneumonia Vaccine:    Vaccine Type: Pneumovax    Site: right deltoid    Mfr: Merck    Dose: 0.5 ml    Route: IM    Given by: Selena Batten Dance CMA (AAMA)    Exp. Date: 12/05/2011    Lot #: [id number]    VIS given: 06/15/96 version given June 19, 2010.

## 2010-12-20 ENCOUNTER — Encounter: Payer: Self-pay | Admitting: Family Medicine

## 2010-12-20 ENCOUNTER — Ambulatory Visit (INDEPENDENT_AMBULATORY_CARE_PROVIDER_SITE_OTHER): Payer: Medicare Other | Admitting: Family Medicine

## 2010-12-20 ENCOUNTER — Ambulatory Visit: Admit: 2010-12-20 | Payer: Self-pay | Admitting: Family Medicine

## 2010-12-20 ENCOUNTER — Other Ambulatory Visit: Payer: Self-pay | Admitting: Family Medicine

## 2010-12-20 DIAGNOSIS — I10 Essential (primary) hypertension: Secondary | ICD-10-CM

## 2010-12-20 DIAGNOSIS — E119 Type 2 diabetes mellitus without complications: Secondary | ICD-10-CM

## 2010-12-20 DIAGNOSIS — E785 Hyperlipidemia, unspecified: Secondary | ICD-10-CM

## 2010-12-20 DIAGNOSIS — I69959 Hemiplegia and hemiparesis following unspecified cerebrovascular disease affecting unspecified side: Secondary | ICD-10-CM | POA: Insufficient documentation

## 2010-12-20 LAB — HEPATIC FUNCTION PANEL
AST: 29 U/L (ref 0–37)
Albumin: 3.7 g/dL (ref 3.5–5.2)
Alkaline Phosphatase: 130 U/L — ABNORMAL HIGH (ref 39–117)
Total Protein: 7.4 g/dL (ref 6.0–8.3)

## 2010-12-20 LAB — BASIC METABOLIC PANEL
CO2: 30 mEq/L (ref 19–32)
Calcium: 9.1 mg/dL (ref 8.4–10.5)
Glucose, Bld: 221 mg/dL — ABNORMAL HIGH (ref 70–99)
Sodium: 136 mEq/L (ref 135–145)

## 2010-12-26 NOTE — Assessment & Plan Note (Signed)
Summary: DM f/u   Vital Signs:  Patient profile:   70 year old male Temp:     98.5 degrees F oral Pulse rate:   76 / minute Pulse rhythm:   regular BP sitting:   140 / 70  (left arm) Cuff size:   large  Vitals Entered By: Selena Batten Dance CMA (AAMA) (December 20, 2010 8:09 AM) CC: 3 month Follow up   History of Present Illness: CC:  f/u DM  1. DM - forgot to bring log.  Niece has been checking sugars, but has been checking right after eating.  running 300s.  No paresthesias, CP/tightness, SOB.  No low sugars.  no smoking, no fmhx bladder cancer.  no blood in urine.  vision screen 2-3 mo ago.  saw podiatrist a few weeks ago, trimmed nails.  A1c 7.7% last checked.  h/o lows causing falls.  not fasting today.  HTN - not optimal control.  pt feels this level is sufficient, doesn't want change in meds.  no HA, CP/tightness.  HLD - good control on current regimen.  no myalgias.  Current Medications (verified): 1)  Actos 30 Mg Tabs (Pioglitazone Hcl) .Marland Kitchen.. 1 Tablet By Mouth Once A Day 2)  Dilt-Cd 300 Mg Xr24h-Cap (Diltiazem Hcl Coated Beads) .Marland Kitchen.. 1 Daily By Mouth 3)  Hyzaar 100-25 Mg Tabs (Losartan Potassium-Hctz) .Marland Kitchen.. 1 Tablet By Mouth Once A Day 4)  Novolog Mix 70/30 70-30 % Susp (Insulin Aspart Prot & Aspart) .... 25units in Am, 20units in Pm, Qs 1 Month 5)  Caduet 5-80 Mg  Tabs (Amlodipine-Atorvastatin) .... One Tab By Mouth Hs 6)  Klor-Con 10 10 Meq Cr-Tabs (Potassium Chloride) .... 2 By Mouth Once Daily  Allergies: 1)  ! * Plastic Tape  Past History:  Past Medical History: Diabetes mellitus, type II: 1992 Hyperlipidemia: 10/1998 Hypertension:1992 h/o CRI R CVA with residual LUE  weakness 1997  Social History: remote smoking, quit after CVA, no EtOH Marital Status: Married: DIVORCED SINCE 1982 Children: 2 CHILDREN Occupation: RETIRED MEDICALLY FROM [employer]  Review of Systems       per HPI  Physical Exam  General:  Well-developed,well-nourished,in no acute  distress; alert,appropriate and cooperative throughout examination Head:  Normocephalic and atraumatic without obvious abnormalities. No apparent alopecia but  balding. Mouth:  Oral mucosa and oropharynx without lesions or exudates.  dentures in. Neck:  No deformities, masses, or tenderness noted. Lungs:  Normal respiratory effort, chest expands symmetrically. Lungs are clear to auscultation, no crackles or wheezes. Heart:  Normal rate and regular rhythm. S1 and S2 normal without gallop, murmur, click, rub or other extra sounds. Pulses:  2+ rad pulses Extremities:  mild edema.  LLE in footdrop brace. LUE in sling Neurologic:  Residual hemiparesis:  LUE hemiparesis, LLE less weakness, R side WNL Skin:  dry skin throughout  Diabetes Management Exam:    Eye Exam:       Eye Exam done elsewhere          Date: 08/18/2010          Results: glaucoma          Done by: unsure   Impression & Recommendations:  Problem # 1:  DIABETES MELLITUS, TYPE II (ICD-250.00) Assessment Unchanged check A1c today.  continue current meds.  per pt, no lows.  highs but right after eating, not checking 2 hr pp.  advised to check and return with log.  utd foot exam, eye exam.  His updated medication list for this problem includes:  Aspirin 81 Mg Tbec (Aspirin) ..... One daily    Actos 30 Mg Tabs (Pioglitazone hcl) .Marland Kitchen... 1 tablet by mouth once a day    Hyzaar 100-25 Mg Tabs (Losartan potassium-hctz) .Marland Kitchen... 1 tablet by mouth once a day    Novolog Mix 70/30 70-30 % Susp (Insulin aspart prot & aspart) .Marland Kitchen... 25units in am, 20units in pm, qs 1 month  Orders: TLB-A1C / Hgb A1C (Glycohemoglobin) (83036-A1C) TLB-BMP (Basic Metabolic Panel-BMET) (80048-METABOL) TLB-Hepatic/Liver Function Pnl (80076-HEPATIC)  Labs Reviewed: Creat: 1.3 (06/21/2010)   Microalbumin: 14 (07/30/2006)  Last Eye Exam: glaucoma (06/28/2008) Reviewed HgBA1c results: 7.7 (06/21/2010)  5.9 (09/04/2009)  Problem # 2:  HYPERTENSION  (ICD-401.9) Assessment: Unchanged not optimal goal, did take meds this am.  no changes today.  His updated medication list for this problem includes:    Dilt-cd 300 Mg Xr24h-cap (Diltiazem hcl coated beads) .Marland Kitchen... 1 daily by mouth    Hyzaar 100-25 Mg Tabs (Losartan potassium-hctz) .Marland Kitchen... 1 tablet by mouth once a day    Caduet 5-80 Mg Tabs (Amlodipine-atorvastatin) ..... One tab by mouth hs  BP today: 140/70 Prior BP: 138/60 (09/10/2010)  Labs Reviewed: K+: 3.9 (06/21/2010) Creat: : 1.3 (06/21/2010)   Chol: 121 (06/21/2010)   HDL: 53.90 (06/21/2010)   LDL: 61 (06/21/2010)   TG: 33.0 (06/21/2010)  Problem # 3:  HYPERLIPIDEMIA (ICD-272.4) Assessment: Unchanged previously good control.  check dLDL.  His updated medication list for this problem includes:    Caduet 5-80 Mg Tabs (Amlodipine-atorvastatin) ..... One tab by mouth hs  Orders: TLB-Cholesterol, Direct LDL (83721-DIRLDL) Venipuncture (82956)  Labs Reviewed: SGOT: 27 (06/21/2010)   SGPT: 22 (06/21/2010)   HDL:53.90 (06/21/2010), 43.9 (08/29/2008)  LDL:61 (06/21/2010), 69 (08/29/2008)  Chol:121 (06/21/2010), 123 (08/29/2008)  Trig:33.0 (06/21/2010), 50 (08/29/2008)  Complete Medication List: 1)  Aspirin 81 Mg Tbec (Aspirin) .... One daily 2)  Actos 30 Mg Tabs (Pioglitazone hcl) .Marland Kitchen.. 1 tablet by mouth once a day 3)  Dilt-cd 300 Mg Xr24h-cap (Diltiazem hcl coated beads) .Marland Kitchen.. 1 daily by mouth 4)  Hyzaar 100-25 Mg Tabs (Losartan potassium-hctz) .Marland Kitchen.. 1 tablet by mouth once a day 5)  Novolog Mix 70/30 70-30 % Susp (Insulin aspart prot & aspart) .... 25units in am, 20units in pm, qs 1 month 6)  Caduet 5-80 Mg Tabs (Amlodipine-atorvastatin) .... One tab by mouth hs 7)  Klor-con 10 10 Meq Cr-tabs (Potassium chloride) .... 2 by mouth once daily  Patient Instructions: 1)  Check sugars either first thing in morning fasting OR 2 hours after dinner.  write down numbers and bring to next visit. 2)  Return in 4 months for follow up. 3)   Blood work today. 4)  Good to see you today.  Call clinic with questions.   Orders Added: 1)  TLB-A1C / Hgb A1C (Glycohemoglobin) [83036-A1C] 2)  TLB-BMP (Basic Metabolic Panel-BMET) [80048-METABOL] 3)  TLB-Hepatic/Liver Function Pnl [80076-HEPATIC] 4)  TLB-Cholesterol, Direct LDL [83721-DIRLDL] 5)  Venipuncture [21308] 6)  Est. Patient Level IV [65784]    Current Allergies (reviewed today): ! * PLASTIC TAPE   Prevention & Chronic Care Immunizations   Influenza vaccine: Fluvax 3+  (09/10/2010)    Tetanus booster: 01/26/2009: Tdap   Tetanus booster due: 01/27/2019    Pneumococcal vaccine: Pneumovax  (06/19/2010)   Pneumococcal vaccine deferral: Not indicated  (09/10/2010)    H. zoster vaccine: Not documented  Colorectal Screening   Hemoccult: Not documented   Hemoccult action/deferral: Refused  (12/20/2010)    Colonoscopy: Not documented   Colonoscopy action/deferral: Refused  (  12/20/2010)  Other Screening   PSA: 2.53  (09/10/2010)   PSA due due: 09/11/2011   Smoking status: quit  (05/04/2007)  Diabetes Mellitus   HgbA1C: 7.7  (06/21/2010)    Eye exam: glaucoma  (08/18/2010)   Eye exam due: 08/2011    Foot exam: yes  (09/10/2010)   High risk foot: Not documented   Foot care education: Not documented    Urine microalbumin/creatinine ratio: 43.1  (08/29/2008)    Diabetes flowsheet reviewed?: Yes   Progress toward A1C goal: At goal  Lipids   Total Cholesterol: 121  (06/21/2010)   LDL: 61  (06/21/2010)   LDL Direct: Not documented   HDL: 53.90  (06/21/2010)   Triglycerides: 33.0  (06/21/2010)    SGOT (AST): 27  (06/21/2010)   SGPT (ALT): 22  (06/21/2010)   Alkaline phosphatase: 123  (06/21/2010)   Total bilirubin: 0.9  (06/21/2010)    Lipid flowsheet reviewed?: Yes   Progress toward LDL goal: At goal  Hypertension   Last Blood Pressure: 140 / 70  (12/20/2010)   Serum creatinine: 1.3  (06/21/2010)   Serum potassium 3.9  (06/21/2010)     Hypertension flowsheet reviewed?: No   Progress toward BP goal: Unchanged  Self-Management Support :   Personal Goals (by the next clinic visit) :     Personal A1C goal: 8  (12/20/2010)     Personal blood pressure goal: 130/80  (09/10/2010)     Personal LDL goal: 70  (09/10/2010)    Diabetes self-management support: Not documented    Hypertension self-management support: Not documented    Lipid self-management support: Not documented    Appended Document: DM f/u    Clinical Lists Changes  Medications: Changed medication from ASPIRIN 81 MG TBEC (ASPIRIN) one daily to ECOTRIN 325 MG TBEC (ASPIRIN) take one daily for stroke prevention

## 2011-01-18 ENCOUNTER — Ambulatory Visit (INDEPENDENT_AMBULATORY_CARE_PROVIDER_SITE_OTHER): Payer: Medicare Other | Admitting: Family Medicine

## 2011-01-18 ENCOUNTER — Encounter: Payer: Self-pay | Admitting: Family Medicine

## 2011-01-18 DIAGNOSIS — E119 Type 2 diabetes mellitus without complications: Secondary | ICD-10-CM

## 2011-01-24 ENCOUNTER — Telehealth: Payer: Self-pay | Admitting: Family Medicine

## 2011-01-24 NOTE — Assessment & Plan Note (Addendum)
Summary: DM Follow up//kad   Vital Signs:  Patient profile:   70 year old male Weight:      204.75 pounds BMI:     28.66 Temp:     98.3 degrees F oral Pulse rate:   88 / minute Pulse rhythm:   regular BP sitting:   142 / 64  (right arm) Cuff size:   large  Vitals Entered By: Selena Batten Dance CMA (AAMA) (January 18, 2011 8:16 AM) CC: DM follow up CBG Result 245   History of Present Illness: CC: DM  1. DM - on actos 30mg  daily and 30 units 69/30 in am and 25 u in pm.  cannot check sugars 2/2 L hemiparesis.  doesn't have anyone to help him check sugars.  forgot 30u this morning.  doesn't feel could afford nurse for home health.  Doesn't think has medicare D.  Denies lows.  No blurry vision, chest pain or tightness, paresthesias.  does get good vegetables in.  watching diet.  brings log of checking sugar a few times each month ranging from 110 to 518.  Last one checked 12/22/2010 with 403 (this was prior to increasing insulin dose).  hasn't checked since then because he cannot, states doesn't have anyone to help him.  Allergies: 1)  ! * Plastic Tape  Past History:  Past Medical History: Last updated: 12/20/2010 Diabetes mellitus, type II: 1992 Hyperlipidemia: 10/1998 Hypertension:1992 h/o CRI R CVA with residual LUE  weakness 1997  Social History: Last updated: 12/20/2010 remote smoking, quit after CVA, no EtOH Marital Status: Married: DIVORCED SINCE 1982 Children: 2 CHILDREN Occupation: RETIRED MEDICALLY FROM [employer]  Review of Systems       per HPI  Physical Exam  General:  Well-developed,well-nourished,in no acute distress; alert,appropriate and cooperative throughout examination Mouth:  Oral mucosa and oropharynx without lesions or exudates.  dentures in. Neck:  No deformities, masses, or tenderness noted. Lungs:  Normal respiratory effort, chest expands symmetrically. Lungs are clear to auscultation, no crackles or wheezes. Heart:  Normal rate and regular  rhythm. S1 and S2 normal without gallop, murmur, click, rub or other extra sounds. Pulses:  2+ rad pulses Extremities:  mild edema.  LLE in footdrop brace. LUE in sling Neurologic:  Residual hemiparesis:  LUE hemiparesis, LLE less weakness, R side WNL   Impression & Recommendations:  Problem # 1:  DIABETES MELLITUS, TYPE II (ICD-250.00) difficult situation given he cannot check sugars at home, states doesn't have anyone to help check.  needs home health, states doesn't think medicare will cover and would not be able to afford.  advised I will call [facility] to see what cost to him would be at least temporarily while get better control of diabetes.  anticipate better control as went up on insulin, but want to go slowly given h/o hypoglycemia in past.  return 2 mo for rpt blood work and DM follow up.  cbg 245 today (forgot am dose of insulin.)  His updated medication list for this problem includes:    Ecotrin 325 Mg Tbec (Aspirin) .Marland Kitchen... Take one daily for stroke prevention    Actos 30 Mg Tabs (Pioglitazone hcl) .Marland Kitchen... 1 tablet by mouth once a day    Hyzaar 100-25 Mg Tabs (Losartan potassium-hctz) .Marland Kitchen... 1 tablet by mouth once a day    Novolog Mix 70/30 70-30 % Susp (Insulin aspart prot & aspart) .Marland KitchenMarland KitchenMarland KitchenMarland Kitchen 30 units in am, 25 units in pm, qs 1 month  Orders: Glucose, (CBG) (16109)  Labs Reviewed:  Creat: 1.3 (12/20/2010)   Microalbumin: 14 (07/30/2006)  Last Eye Exam: glaucoma (08/18/2010) Reviewed HgBA1c results: 10.3 (12/20/2010)  7.7 (06/21/2010)  Complete Medication List: 1)  Ecotrin 325 Mg Tbec (Aspirin) .... Take one daily for stroke prevention 2)  Actos 30 Mg Tabs (Pioglitazone hcl) .Marland Kitchen.. 1 tablet by mouth once a day 3)  Dilt-cd 300 Mg Xr24h-cap (Diltiazem hcl coated beads) .Marland Kitchen.. 1 daily by mouth 4)  Hyzaar 100-25 Mg Tabs (Losartan potassium-hctz) .Marland Kitchen.. 1 tablet by mouth once a day 5)  Novolog Mix 70/30 70-30 % Susp (Insulin aspart prot & aspart) .... 30 units in am, 25 units in pm, qs 1  month 6)  Caduet 5-80 Mg Tabs (Amlodipine-atorvastatin) .... One tab by mouth hs 7)  Klor-con 10 10 Meq Cr-tabs (Potassium chloride) .... 2 by mouth once daily  Patient Instructions: 1)  I'll check into advanced home care and the cost to you. 2)  No changes for now. 3)  Return in 2-3 months for follow up. 4)  Good to see you today.   Orders Added: 1)  Glucose, (CBG) [82962] 2)  Est. Patient Level III [16109]    Current Allergies (reviewed today): ! * PLASTIC TAPE  Laboratory Results   Blood Tests   Date/Time Received: January 18, 2011 8:45 AM  Date/Time Reported: January 18, 2011 8:45 AM   CBG Random:: 245mg /dL      Appended Document: DM Follow up//kad    Clinical Lists Changes  Orders: Added new Referral order of Home Health Referral [facility]) - Signed

## 2011-01-29 NOTE — Progress Notes (Signed)
Summary: regarding blood sugar  Phone Note From Other Clinic Call back at [phone]   Caller: Dois Davenport with [facility] Summary of Call: Nurse with [facility] states you had wanted pt's blood sugar checked every day, she says they cant do that, medicare will not pay for it.  His FBS this morning was 118. Initial call taken by: Lowella Petties CMA, [facility],  January 24, 2011 8:50 AM  Follow-up for Phone Call        how much help can we get him then?  I mainly want someone to come out and assess his needs, help him with blood sugar measurement intermittently while we get better control of sugars.  did we fax them our last office visit? Follow-up by: [name]  MD,  January 24, 2011 8:52 AM  Additional Follow-up for Phone Call Additional follow up Details #1::        Spoke with Dois Davenport at [facility]. She said she is getting it set up for right now that a nurse will come everyday for 5 days. That's the best she could right now. She said she may be able to work it out for them to come out once a week for 9 weeks and then reassess. She is also contacting a Child psychotherapist to see about some community resources that could be untilized as well as trying to find someone Biomedical engineer, family) that can come check his sugar. She felt that Mr. Gibbon is depressed/lonely. He kept telling her that he wouldn't have had his stroke if his wife hadn't walked out on him. She said he has no interactions with people to talk to or socialize with. He doesn't have any form of companionship (People or pet). She is checking with the social worker about that as well. Possibly some sort of church activities in the community or an adult "day care" where could go just for socialization and interaction.  She said the last check in the memory of his meter was on 01-09-11 and read 449. He refused to let her give him his insulin today because he was told he could only have it 30 minutes after a meal-no exceptions. She tried explaining to him he needed it  prior to a meal and he would not allow her to give it to him. She left without giving it to him and he had still not eaten. He says he eats cereal for breakfast around 10 or 11. Then eats a big lunch and has an apple for supper. She explained to him he needed balanced meals. She also said his main meal appeared to come from [employer]. There were lots of styrofoam containers from there, so unknown salt and sugar intake with that meal.    He kept telling her he wanted a meter that he could just hold up to his skin and check his sugar. That way he could do it himself. She is unaware of any meter that can do that.  Additional Follow-up by: Janee Morn CMA Duncan Dull),  January 24, 2011 12:23 PM    Additional Follow-up for Phone Call Additional follow up Details #2::    noted thanks.  agree with social work eval.  appreciate [facility] care of patient.  Follow-up by: [name]  MD,  January 24, 2011 1:07 PM

## 2011-02-01 ENCOUNTER — Encounter: Payer: Self-pay | Admitting: Family Medicine

## 2011-02-01 DIAGNOSIS — E785 Hyperlipidemia, unspecified: Secondary | ICD-10-CM

## 2011-02-01 DIAGNOSIS — E119 Type 2 diabetes mellitus without complications: Secondary | ICD-10-CM

## 2011-02-01 DIAGNOSIS — I10 Essential (primary) hypertension: Secondary | ICD-10-CM

## 2011-02-01 DIAGNOSIS — I639 Cerebral infarction, unspecified: Secondary | ICD-10-CM

## 2011-02-01 DIAGNOSIS — N189 Chronic kidney disease, unspecified: Secondary | ICD-10-CM

## 2011-02-07 DIAGNOSIS — E119 Type 2 diabetes mellitus without complications: Secondary | ICD-10-CM

## 2011-02-07 DIAGNOSIS — I10 Essential (primary) hypertension: Secondary | ICD-10-CM

## 2011-02-07 DIAGNOSIS — I69959 Hemiplegia and hemiparesis following unspecified cerebrovascular disease affecting unspecified side: Secondary | ICD-10-CM

## 2011-02-11 LAB — URINALYSIS, ROUTINE W REFLEX MICROSCOPIC
Protein, ur: NEGATIVE mg/dL
Urobilinogen, UA: 0.2 mg/dL (ref 0.0–1.0)

## 2011-02-11 LAB — BASIC METABOLIC PANEL
BUN: 15 mg/dL (ref 6–23)
CO2: 30 mEq/L (ref 19–32)
Chloride: 105 mEq/L (ref 96–112)
Creatinine, Ser: 1.18 mg/dL (ref 0.4–1.5)
GFR calc Af Amer: >60 mL/min (ref >60)

## 2011-02-11 LAB — URINE CULTURE: Colony Count: >=100000

## 2011-02-11 LAB — GLUCOSE, CAPILLARY: Glucose-Capillary: 120 mg/dL — ABNORMAL HIGH (ref 70–99)

## 2011-02-11 LAB — URINE MICROSCOPIC-ADD ON

## 2011-04-22 ENCOUNTER — Encounter: Payer: Self-pay | Admitting: Family Medicine

## 2011-04-22 ENCOUNTER — Ambulatory Visit (INDEPENDENT_AMBULATORY_CARE_PROVIDER_SITE_OTHER): Payer: Medicare Other | Admitting: Family Medicine

## 2011-04-22 VITALS — BP 160/80 | HR 92 | Temp 98.3°F | Wt 227.1 lb

## 2011-04-22 DIAGNOSIS — N259 Disorder resulting from impaired renal tubular function, unspecified: Secondary | ICD-10-CM

## 2011-04-22 DIAGNOSIS — E119 Type 2 diabetes mellitus without complications: Secondary | ICD-10-CM

## 2011-04-22 DIAGNOSIS — E785 Hyperlipidemia, unspecified: Secondary | ICD-10-CM

## 2011-04-22 DIAGNOSIS — I69959 Hemiplegia and hemiparesis following unspecified cerebrovascular disease affecting unspecified side: Secondary | ICD-10-CM

## 2011-04-22 DIAGNOSIS — I10 Essential (primary) hypertension: Secondary | ICD-10-CM

## 2011-04-22 DIAGNOSIS — Z87891 Personal history of nicotine dependence: Secondary | ICD-10-CM

## 2011-04-22 LAB — BASIC METABOLIC PANEL
BUN: 15 mg/dL (ref 6–23)
GFR: 83.54 mL/min (ref >60.00)
Glucose, Bld: 170 mg/dL — ABNORMAL HIGH (ref 70–99)
Potassium: 3.5 mEq/L (ref 3.5–5.1)

## 2011-04-22 LAB — MICROALBUMIN / CREATININE URINE RATIO: Microalb Creat Ratio: 5.7 mg/g (ref 0.0–30.0)

## 2011-04-22 NOTE — Assessment & Plan Note (Signed)
Elevated bp today, didn't take meds.  rec take when arrives home.   Asked to keep weekly log and bring to next appt. ROS negative. Goal <130/80.

## 2011-04-22 NOTE — Assessment & Plan Note (Signed)
On ASA 325, stable.

## 2011-04-22 NOTE — Assessment & Plan Note (Signed)
H/o Cr elevated in past to 1.3.  recheck today.  Would tolerate metformin.

## 2011-04-22 NOTE — Patient Instructions (Addendum)
Blood work today.   No changes today. Depending on results of blood work, we may make some changes to diabetes meds.  (may start actos/metformin combo instead of actos but we will give you a call if we want to change meds). Good to see you today, call us with questions. If you could, please keep log of blood pressures once a week (when someone comes to help you).  Goal for you is less than 130/80 for blood pressure, <100 for pulse.  Bring in to next visit.  If you could, also keep log of sugars a few times a month (either fasting in am or 2 hours after eating).

## 2011-04-22 NOTE — Assessment & Plan Note (Signed)
Recommended continue caduet.  On 80mg  lipitor. Lab Results  Component Value Date   LDLCALC 61 06/21/2010  recheck today.

## 2011-04-22 NOTE — Assessment & Plan Note (Signed)
Compliant with med.  Difficult situation as trouble checking sugars at home, difficult to titrate meds. Check A1c today, if >8% consider addition of metformin.  Cr would tolerate. Foot exam today.  UTD eye exam. rec try to keep weekly log when family/friends come to visit and bring next visit.

## 2011-04-22 NOTE — Progress Notes (Signed)
  Subjective:    Patient ID: Marcus Downs, male    DOB: 1940/12/24, 70 y.o.   MRN: [id number]  HPI CC: 3 mo f/u  Marcus Downs presents alone today.  Did have home health aide come in and provide glucose meter and blood pressure meter, they came to his house for 3 wks.  Also checked bp.  Brings log of sugar today from their visits back in March, hasn't been able to check sugars since.  Back then fasting running 109-146.  nonfasting 240s.  Home health concerned pt somewhat depressed.  Didn't have time to dsicuss, will need to eval in future visit.  States does have housekeeper come weekly, she may be able to help him.  DM - compliant with 70/30, actos.  Doesn't think has been on metformin in past.  No numbness in hands or feet.  Last eye exam was last month according to patient.  Has cataracts and glaucoma.  Foot exam to perform today.  Due for blood work today.  Pt states cuts own nails, R foot able to, trouble with L foot.  Has seen podiatrist in past (this year).  HTN - elevated today, didn't take meds this am because fasting.  No HA, vision changes, CP/tightness, SOB, leg swelling.  Doesn't like to take meds on empty stomach so every time he comes in, off meds.  HLD - on caduet.  No myalgias.  Tolerant of and compliant with med.  Medications and allergies reviewed and updated in chart. PMHx reviewed Past Medical History  Diagnosis Date  . Diabetes mellitus type II 1992  . HLD (hyperlipidemia) 10/1998  . HTN (hypertension) 10/1998  . 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 CRI (chronic renal insufficiency)    Past Surgical History  Procedure Date  . Lipoma removal 1970's    Right shoulder    reports that he has quit smoking. He does not have any smokeless tobacco history on file. He reports that he does not drink alcohol or use illicit drugs. family history includes Heart attack (age of onset:77) in his father and Hypertension in his sister. No Known  Allergies  Review of Systems Per HPI    Objective:   Physical Exam  Nursing note and vitals reviewed. Constitutional: He appears well-developed and well-nourished. No distress.  HENT:  Head: Normocephalic and atraumatic.  Mouth/Throat: Oropharynx is clear and moist. No oropharyngeal exudate.  Eyes: Conjunctivae and EOM are normal. Pupils are equal, round, and reactive to light. No scleral icterus.  Neck: Normal range of motion. Neck supple. No thyromegaly present.  Cardiovascular: Normal rate, regular rhythm, normal heart sounds and intact distal pulses.   No murmur heard. Pulses:      Dorsalis pedis pulses are 2+ on the right side, and 2+ on the left side.       Posterior tibial pulses are 2+ on the right side, and 2+ on the left side.  Pulmonary/Chest: Effort normal and breath sounds normal. No respiratory distress. He has no wheezes. He has no rales.  Musculoskeletal:       L foot in foot drop brace  Lymphadenopathy:    He has no cervical adenopathy.  Skin: Skin is warm and dry. No rash noted.  Psychiatric: He has a normal mood and affect.  .    Diabetic foot exam: See separate form  Assessment & Plan:

## 2011-04-29 ENCOUNTER — Ambulatory Visit: Payer: No Typology Code available for payment source | Admitting: Family Medicine

## 2011-06-01 ENCOUNTER — Other Ambulatory Visit: Payer: Self-pay | Admitting: Family Medicine

## 2011-07-02 ENCOUNTER — Other Ambulatory Visit: Payer: Self-pay | Admitting: Family Medicine

## 2011-07-23 ENCOUNTER — Encounter: Payer: Self-pay | Admitting: Family Medicine

## 2011-07-23 ENCOUNTER — Ambulatory Visit (INDEPENDENT_AMBULATORY_CARE_PROVIDER_SITE_OTHER): Payer: Medicare Other | Admitting: Family Medicine

## 2011-07-23 VITALS — BP 152/98 | HR 88 | Temp 98.5°F

## 2011-07-23 DIAGNOSIS — N259 Disorder resulting from impaired renal tubular function, unspecified: Secondary | ICD-10-CM

## 2011-07-23 DIAGNOSIS — I10 Essential (primary) hypertension: Secondary | ICD-10-CM

## 2011-07-23 DIAGNOSIS — E119 Type 2 diabetes mellitus without complications: Secondary | ICD-10-CM

## 2011-07-23 DIAGNOSIS — E785 Hyperlipidemia, unspecified: Secondary | ICD-10-CM

## 2011-07-23 MED ORDER — METFORMIN HCL 500 MG PO TABS: ORAL_TABLET | ORAL | Status: DC

## 2011-07-23 NOTE — Assessment & Plan Note (Signed)
Good control as of last check, starting metformin so merits close monitoring. Lab Results  Component Value Date   CREATININE 1.1 04/22/2011

## 2011-07-23 NOTE — Progress Notes (Signed)
  Subjective:    Patient ID: Marcus Downs, male    DOB: 10/18/1941, 70 y.o.   MRN: [id number]  HPI CC: 3 mo f/u DM  1. DM - difficult situation as unable to check sugars.  Niece moved down to [place].  No family comes by to check on him.  No paresthesias.  Eye doctor 3 mo ago.  To see again Oct.  Cataracts and glaucoma (Dr. Harlon Flor in [place]).  Concerned about actos and risk of bladder cancer.  Has been on 30mg  for several years.  Had nurse and nurse aid come help him for 1-2 months, that is done.  2. HTN - elevated today, did take meds this morning.  No HA, vision changes, CP/tightness, SOB, leg swelling.  3. Mood - endorses feeling fine.  Denies feeling depressed.  Tries to keep "positive outlook on life".  No hobbies - "I can't do anything now".  Doesn't like to leave house.  No problems with preparing meals - prepares his breakfast by himself.  Dinner brought in by friend from [employer] once a week.  Just eats 2 meals, breakfast and dinner.  At nighttime has fruit snack (apple).  No recent falls.  Has friend who comes in once a week to check on him, helps him refill meds.  Wt Readings from Last 3 Encounters:  04/22/11 227 lb 1.9 oz (103.021 kg)  01/18/11 204 lb 12 oz (92.874 kg)  01/22/10 204 lb 12 oz (92.874 kg)   Review of Systems Per HPI    Objective:   Physical Exam  Nursing note and vitals reviewed. Constitutional: He appears well-developed and well-nourished. No distress.  HENT:  Head: Normocephalic and atraumatic.  Mouth/Throat: Oropharynx is clear and moist. No oropharyngeal exudate.  Eyes: Conjunctivae and EOM are normal. Pupils are equal, round, and reactive to light. No scleral icterus.  Neck: Normal range of motion. Neck supple.  Cardiovascular: Normal rate, regular rhythm, normal heart sounds and intact distal pulses.   No murmur heard. Pulmonary/Chest: Effort normal and breath sounds normal. No respiratory distress. He has no wheezes. He has no rales.    Lymphadenopathy:    He has no cervical adenopathy.          Assessment & Plan:

## 2011-07-23 NOTE — Patient Instructions (Addendum)
Good to see you today.  Return in 3 months for follow up. When you run out of actos, change to metformin (another diabetes medicine). Start at 500mg  nightly, then after 1 week may increase to 500mg  twice daily. Call us with questions.

## 2011-07-23 NOTE — Assessment & Plan Note (Signed)
Good control caduet. Due for recheck, will do FLP next visit when returns fasting.

## 2011-07-23 NOTE — Assessment & Plan Note (Addendum)
Recheck next visit.   Discussed Actos - pt desires to come off given concern with bladder cancer.  Will slowly transition to metformin. Difficult as unable to check sugars at home. Discussed gi side effects to watch for.

## 2011-07-23 NOTE — Assessment & Plan Note (Addendum)
Elevated again today (did take meds this am). No change today. One change at a time (metformin).

## 2011-09-21 ENCOUNTER — Other Ambulatory Visit: Payer: Self-pay | Admitting: Family Medicine

## 2011-10-22 ENCOUNTER — Encounter: Payer: Self-pay | Admitting: Family Medicine

## 2011-10-22 ENCOUNTER — Ambulatory Visit (INDEPENDENT_AMBULATORY_CARE_PROVIDER_SITE_OTHER): Payer: Medicare Other | Admitting: Family Medicine

## 2011-10-22 VITALS — BP 160/68 | HR 80 | Temp 98.4°F | Wt 227.0 lb

## 2011-10-22 DIAGNOSIS — E119 Type 2 diabetes mellitus without complications: Secondary | ICD-10-CM

## 2011-10-22 DIAGNOSIS — I10 Essential (primary) hypertension: Secondary | ICD-10-CM

## 2011-10-22 DIAGNOSIS — N259 Disorder resulting from impaired renal tubular function, unspecified: Secondary | ICD-10-CM

## 2011-10-22 DIAGNOSIS — E785 Hyperlipidemia, unspecified: Secondary | ICD-10-CM

## 2011-10-22 LAB — BASIC METABOLIC PANEL
CO2: 27 mEq/L (ref 19–32)
Chloride: 100 mEq/L (ref 96–112)
Creatinine, Ser: 1.3 mg/dL (ref 0.4–1.5)

## 2011-10-22 MED ORDER — AMLODIPINE-ATORVASTATIN 10-80 MG PO TABS: 1.0000 | ORAL_TABLET | Freq: Every day | ORAL | Status: DC

## 2011-10-22 NOTE — Assessment & Plan Note (Signed)
On caduet. FLP when returns fasting.

## 2011-10-22 NOTE — Progress Notes (Signed)
  Subjective:    Patient ID: Marcus Downs, male    DOB: 16-Oct-1941, 70 y.o.   MRN: [id number]  HPI CC: 3 mo f/u  DM - taking metformin 2 in evening prior to bedtime.  No paresthesias.  Vision screen 07/2011.  Told "eyes were doing fine"  Doesn't check sugars.  HTN - no dizziness. compliant with meds.  No HA, vision changes, CP/tightness, SOB, leg swelling.   HLD - no myalgias.  Compliant with caduet.  Requests flu shot today.  Lab Results  Component Value Date   CREATININE 1.1 04/22/2011   Lab Results  Component Value Date   HGBA1C 8.0* 04/22/2011   Review of Systems Per HPI    Objective:   Physical Exam  Nursing note and vitals reviewed. Constitutional: He appears well-developed and well-nourished. No distress.  HENT:  Head: Normocephalic and atraumatic.  Right Ear: External ear normal.  Left Ear: External ear normal.  Nose: Nose normal.  Mouth/Throat: Oropharynx is clear and moist. No oropharyngeal exudate.  Eyes: Conjunctivae and EOM are normal. Pupils are equal, round, and reactive to light. No scleral icterus.  Neck: Normal range of motion. Neck supple. Carotid bruit is not present.  Cardiovascular: Normal rate, regular rhythm, normal heart sounds and intact distal pulses.   No murmur heard. Pulmonary/Chest: Effort normal and breath sounds normal. No respiratory distress. He has no wheezes. He has no rales.  Musculoskeletal: He exhibits no edema.       Diabetic foot exam: Normal inspection Thickened, scaly skin soles No calluses  Normal DP/PT pulses Normal sensation to light tough and monofilament Nails with fungal infection bilaterally  Residual L hemiparesis  Lymphadenopathy:    He has no cervical adenopathy.  Skin: Skin is warm and dry. No rash noted.  Psychiatric: He has a normal mood and affect.       Assessment & Plan:

## 2011-10-22 NOTE — Patient Instructions (Addendum)
Your blood pressure is too high!  I have increased the dose of your caduet and sent in to the pharmacy.  New script will be higher dose. Try to switch metformin to 1 in the morning and 1 at night to see if it'll help the looser stools. Good to see you today, return in 3 months for follow up, have this be a physical visit. Come in a few days prior or the day of fasting for blood work.

## 2011-10-22 NOTE — Assessment & Plan Note (Signed)
Lab Results  Component Value Date   CREATININE 1.1 04/22/2011  stable.

## 2011-10-22 NOTE — Assessment & Plan Note (Signed)
Chronic, Improved.  A1c 8.0% Backed off actos and started metformin. Recheck A1c today. rtc 3 mo for CPE. Cannot check sugars at home.

## 2011-10-22 NOTE — Assessment & Plan Note (Signed)
BP Readings from Last 3 Encounters:  10/22/11 160/68  07/23/11 152/98  04/22/11 160/80  chronic. increase caduet amlodipine to 10 mg daily. rtc 3 mo for f/u.

## 2011-12-14 ENCOUNTER — Other Ambulatory Visit: Payer: Self-pay | Admitting: Family Medicine

## 2012-01-11 ENCOUNTER — Other Ambulatory Visit: Payer: Self-pay | Admitting: Family Medicine

## 2012-01-24 ENCOUNTER — Encounter: Payer: Self-pay | Admitting: Family Medicine

## 2012-01-24 ENCOUNTER — Ambulatory Visit (INDEPENDENT_AMBULATORY_CARE_PROVIDER_SITE_OTHER): Payer: Medicare Other | Admitting: Family Medicine

## 2012-01-24 DIAGNOSIS — I10 Essential (primary) hypertension: Secondary | ICD-10-CM

## 2012-01-24 DIAGNOSIS — R21 Rash and other nonspecific skin eruption: Secondary | ICD-10-CM

## 2012-01-24 DIAGNOSIS — E785 Hyperlipidemia, unspecified: Secondary | ICD-10-CM

## 2012-01-24 DIAGNOSIS — E119 Type 2 diabetes mellitus without complications: Secondary | ICD-10-CM

## 2012-01-24 MED ORDER — TRIAMCINOLONE ACETONIDE 0.1 % EX CREA: TOPICAL_CREAM | CUTANEOUS | Status: DC | PRN

## 2012-01-24 NOTE — Patient Instructions (Signed)
Good to see you today.  Call us with questions. I think you are doing well. i've sent in steroid cream for skin, let me know if not helping. Return in 4 months for medicare wellness visit. Happy birthday!

## 2012-01-24 NOTE — Assessment & Plan Note (Signed)
Goal for him <8% - does not check sugars at home. Last check a1c 7.8%.   Continue meds and novolon 70/30 as up to now. Denies lows.

## 2012-01-24 NOTE — Progress Notes (Signed)
  Subjective:    Patient ID: Marcus Downs, male    DOB: [birth year], 71 y.o.   MRN: [id number]  HPI CC: 3 mo f/u  Asked to place in physical slot but placed in 15 min appt.  Will return for wellness exam.  Breaking out on back for 4-5 mo now.  Itchy.  Uses back scratcher to relieve itch.  Previously prescribed clobetasol cream which did help.  DM - taking metformin 2 in evening prior to bedtime. Compliant with novolog 70/30.  No paresthesias. Vision screen 07/2011. Told "eyes were doing fine" Doesn't check sugars.  Trouble 2/2 residual weakness on left side.  Denies feeling any lows.  HTN - no dizziness. compliant with meds. No HA, vision changes, CP/tightness, SOB, leg swelling.  HLD - no myalgias. Compliant with caduet.  BP Readings from Last 3 Encounters:  01/24/12 150/72  10/22/11 160/68  07/23/11 152/98    Wt Readings from Last 3 Encounters:  01/24/12 222 lb 1.3 oz (100.735 kg)  10/22/11 227 lb (102.967 kg)  04/22/11 227 lb 1.9 oz (103.021 kg)   Review of Systems Per HPI    Objective:   Physical Exam  Nursing note and vitals reviewed. Constitutional: He appears well-developed and well-nourished. No distress.  HENT:  Head: Normocephalic and atraumatic.  Right Ear: External ear normal.  Left Ear: External ear normal.  Nose: Nose normal.  Mouth/Throat: Oropharynx is clear and moist. No oropharyngeal exudate.  Eyes: Conjunctivae and EOM are normal. Pupils are equal, round, and reactive to light. No scleral icterus.  Neck: Normal range of motion. Neck supple.  Cardiovascular: Normal rate, regular rhythm, normal heart sounds and intact distal pulses.   No murmur heard. Pulmonary/Chest: Effort normal and breath sounds normal. No respiratory distress. He has no wheezes. He has no rales.  Musculoskeletal: He exhibits no edema.  Lymphadenopathy:    He has no cervical adenopathy.  Neurological:       Residual L hemiparesis  Skin: Skin is warm and dry. Rash noted.       Faint  rare pruritic papules on back  Psychiatric: He has a normal mood and affect.       Assessment & Plan:

## 2012-01-24 NOTE — Assessment & Plan Note (Signed)
Improved but still a bit high.  No changes today. If staying high, consider addition of B blocker.

## 2012-01-24 NOTE — Assessment & Plan Note (Signed)
Minimal on exam today. Treat with TCI cream, update if not improving as expected.

## 2012-01-24 NOTE — Assessment & Plan Note (Signed)
Continue caduet at 10/80mg  daily. FLP when returns.

## 2012-03-10 DIAGNOSIS — H409 Unspecified glaucoma: Secondary | ICD-10-CM | POA: Diagnosis not present

## 2012-03-10 DIAGNOSIS — H2510 Age-related nuclear cataract, unspecified eye: Secondary | ICD-10-CM | POA: Diagnosis not present

## 2012-03-10 DIAGNOSIS — H4011X0 Primary open-angle glaucoma, stage unspecified: Secondary | ICD-10-CM | POA: Diagnosis not present

## 2012-05-25 ENCOUNTER — Ambulatory Visit (INDEPENDENT_AMBULATORY_CARE_PROVIDER_SITE_OTHER): Payer: Medicare Other | Admitting: Family Medicine

## 2012-05-25 ENCOUNTER — Encounter: Payer: Self-pay | Admitting: Family Medicine

## 2012-05-25 VITALS — BP 140/68 | HR 100 | Temp 97.7°F | Wt 219.2 lb

## 2012-05-25 DIAGNOSIS — I10 Essential (primary) hypertension: Secondary | ICD-10-CM

## 2012-05-25 DIAGNOSIS — Z0000 Encounter for general adult medical examination without abnormal findings: Secondary | ICD-10-CM | POA: Diagnosis not present

## 2012-05-25 DIAGNOSIS — E119 Type 2 diabetes mellitus without complications: Secondary | ICD-10-CM | POA: Diagnosis not present

## 2012-05-25 DIAGNOSIS — Z125 Encounter for screening for malignant neoplasm of prostate: Secondary | ICD-10-CM

## 2012-05-25 DIAGNOSIS — Z1211 Encounter for screening for malignant neoplasm of colon: Secondary | ICD-10-CM | POA: Diagnosis not present

## 2012-05-25 LAB — HEMOGLOBIN A1C: Hgb A1c MFr Bld: 8.4 % — ABNORMAL HIGH (ref 4.6–6.5)

## 2012-05-25 LAB — PSA, MEDICARE: PSA: 1.98 ng/mL (ref <=4.00)

## 2012-05-25 NOTE — Patient Instructions (Addendum)
Call your insurace about the shingles shot to see if it is covered or how much it would cost and where is cheaper (here or pharmacy).  If you want to receive here, call for nurse visit. Blood work today. Return in 3 months for diabetes follow up. Call us with questions.  Good to see you today.

## 2012-05-25 NOTE — Progress Notes (Signed)
Subjective:    Patient ID: Marcus Downs, male    DOB: 1941/08/12, 71 y.o.   MRN: [id number]  HPI CC: medicare wellness  Lab Results  Component Value Date   HGBA1C 7.8* 10/22/2011  due for blood work today, not fasting.  Caffeine: neg Lives alone: friend comes and helps sometimes.  Family nearby - 2 sisters and nieces/nephews in area.  Younger son in [place] as well.  Preventative: Never had colonoscopy.  Declines iFOB as well.  Hemoccult neg today Requests prostate check today.  Brother in law died from prostate cancer Flu - 2011. Td 2010. Pneumovax - 2011. Shingles shot - will think about this. Vision - some trouble on left.  H/o glaucoma and cataracts, worse on left.  Sees ophtho yearly, last was several months.  To see again this month. Hearing - ok Advanced directives - would want sister, Lafonda Mosses to be HCPOA  No falls in last year Denies anhedonia, depression, sadness.  Medications and allergies reviewed and updated in chart.  Past histories reviewed and updated if relevant as below. Patient Active Problem List  Diagnosis  . DIABETES MELLITUS, TYPE II  . HYPERLIPIDEMIA  . HYPERTENSION  . GERD  . RENAL INSUFFICIENCY  . DYSTROPHIC NAILS  . SYMPTOM, INCONTINENCE, MIXED, URGE/STRESS  . HX, PERSONAL, TOBACCO USE  . CVA WITH LEFT HEMIPARESIS  . Skin rash  . Medicare annual wellness visit, initial   Past Medical History  Diagnosis Date  . Diabetes mellitus type II 1992  . HLD (hyperlipidemia) 10/1998  . HTN (hypertension) 10/1998  . 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 CRI (chronic renal insufficiency)    Past Surgical History  Procedure Date  . Lipoma removal 1970's    Right shoulder   History  Substance Use Topics  . Smoking status: Former Games developer  . Smokeless tobacco: Never Used   Comment: quit after CVA  . Alcohol Use: No   Family History  Problem Relation Age of Onset  . Heart attack Father 81  . Hypertension Sister    Multiple medical problems  . Stroke Neg Hx   . Cancer Neg Hx   . Diabetes Sister    No Known Allergies Current Outpatient Prescriptions on File Prior to Visit  Medication Sig Dispense Refill  . amLODipine-atorvastatin (CADUET) 10-80 MG per tablet Take 1 tablet by mouth daily.  30 tablet  11  . aspirin 325 MG EC tablet Take 325 mg by mouth daily. For stroke prevention        . diltiazem (CARDIZEM CD) 300 MG 24 hr capsule TAKE ONE CAPSULE BY MOUTH EVERY DAY  39 capsule  11  . insulin aspart protamine-insulin aspart (NOVOLOG 70/30) (70-30) 100 UNIT/ML injection Inject into the skin. 30 units in AM; 25 Units in PM       . latanoprost (XALATAN) 0.005 % ophthalmic solution       . losartan-hydrochlorothiazide (HYZAAR) 100-25 MG per tablet TAKE ONE TABLET BY MOUTH EVERY DAY  30 tablet  11  . metFORMIN (GLUCOPHAGE) 500 MG tablet Take 1 tablet (500 mg total) by mouth 2 (two) times daily with a meal.  60 tablet  11  . potassium chloride (K-DUR) 10 MEQ tablet TAKE TWO TABLETS BY MOUTH EVERY DAY  60 tablet  11  . triamcinolone cream (KENALOG) 0.1 % Apply topically as needed. Apply to AA.  30 g  0     Review of Systems  Constitutional: Negative for fever,  chills, activity change, appetite change, fatigue and unexpected weight change.  HENT: Negative for hearing loss and neck pain.   Eyes: Negative for visual disturbance.  Respiratory: Negative for cough, chest tightness, shortness of breath and wheezing.   Cardiovascular: Negative for chest pain, palpitations and leg swelling.  Gastrointestinal: Negative for nausea, vomiting, abdominal pain, diarrhea, constipation, blood in stool and abdominal distention.  Genitourinary: Negative for hematuria and difficulty urinating.  Musculoskeletal: Negative for myalgias and arthralgias.  Skin: Negative for rash.  Neurological: Negative for dizziness, seizures, syncope and headaches.  Hematological: Does not bruise/bleed easily.  Psychiatric/Behavioral:  Negative for dysphoric mood. The patient is not nervous/anxious.        Objective:   Physical Exam  Nursing note and vitals reviewed. Constitutional: He is oriented to person, place, and time. He appears well-developed and well-nourished. No distress.       Residual L hemiparesis, wears sling on left side.  HENT:  Head: Normocephalic and atraumatic.  Right Ear: External ear normal.  Left Ear: External ear normal.  Nose: Nose normal.  Mouth/Throat: Oropharynx is clear and moist. No oropharyngeal exudate.  Eyes: Conjunctivae and EOM are normal. Pupils are equal, round, and reactive to light. No scleral icterus.  Neck: Normal range of motion. Neck supple. No thyromegaly present.  Cardiovascular: Normal rate, regular rhythm, normal heart sounds and intact distal pulses.   No murmur heard. Pulses:      Radial pulses are 2+ on the right side, and 2+ on the left side.  Pulmonary/Chest: Effort normal and breath sounds normal. No respiratory distress. He has no wheezes. He has no rales.  Abdominal: Soft. Bowel sounds are normal. He exhibits no distension and no mass. There is no tenderness. There is no rebound and no guarding.  Genitourinary: Rectum normal. Rectal exam shows no external hemorrhoid, no internal hemorrhoid, no fissure, no mass, no tenderness and anal tone normal. Guaiac negative stool. Prostate is enlarged (slight ~20gm). Prostate is not tender.  Musculoskeletal: Normal range of motion. He exhibits no edema.  Lymphadenopathy:    He has no cervical adenopathy.  Neurological: He is alert and oriented to person, place, and time.       CN grossly intact, station and gait intact  Skin: Skin is warm and dry. No rash noted.  Psychiatric: He has a normal mood and affect. His behavior is normal. Judgment and thought content normal.      Assessment & Plan:

## 2012-05-26 LAB — POC HEMOCCULT BLD/STL (OFFICE/1-CARD/DIAGNOSTIC): Fecal Occult Blood, POC: NEGATIVE

## 2012-05-26 NOTE — Assessment & Plan Note (Signed)
I have personally reviewed the Medicare Annual Wellness questionnaire and have noted 1. The patient's medical and social history 2. Their use of alcohol, tobacco or illicit drugs 3. Their current medications and supplements 4. The patient's functional ability including ADL's, fall risks, home safety risks and hearing or visual impairment. 5. Diet and physical activity 6. Evidence for depression or mood disorders The patients weight, height, BMI have been recorded in the chart.  Hearing and vision has been addressed. I have made referrals, counseling and provided education to the patient based review of the above and I have provided the pt with a written personalized care plan for preventive services. See scanned questionairre. Advanced directives discussed: would want sister to be HCPOA.  Reviewed preventative protocols and updated unless pt declined. Pt will think about shingles. Declines colon screening, hemoccult x1 in office negative for hidden blood. PSA/DRE stable

## 2012-05-26 NOTE — Assessment & Plan Note (Signed)
BP Readings from Last 3 Encounters:  05/25/12 140/68  01/24/12 150/72  10/22/11 160/68  slightly elevated today - continue to monitor.  Better than in past.

## 2012-05-26 NOTE — Assessment & Plan Note (Signed)
Check A1c again today.  Goal <8%. Wt Readings from Last 3 Encounters:  05/25/12 219 lb 4 oz (99.451 kg)  01/24/12 222 lb 1.3 oz (100.735 kg)  10/22/11 227 lb (102.967 kg)

## 2012-06-10 DIAGNOSIS — H409 Unspecified glaucoma: Secondary | ICD-10-CM | POA: Diagnosis not present

## 2012-06-10 DIAGNOSIS — H2510 Age-related nuclear cataract, unspecified eye: Secondary | ICD-10-CM | POA: Diagnosis not present

## 2012-08-15 DIAGNOSIS — H2510 Age-related nuclear cataract, unspecified eye: Secondary | ICD-10-CM | POA: Diagnosis not present

## 2012-08-15 DIAGNOSIS — H4011X0 Primary open-angle glaucoma, stage unspecified: Secondary | ICD-10-CM | POA: Diagnosis not present

## 2012-08-15 DIAGNOSIS — H409 Unspecified glaucoma: Secondary | ICD-10-CM | POA: Diagnosis not present

## 2012-08-26 ENCOUNTER — Ambulatory Visit: Payer: Medicare Other | Admitting: Family Medicine

## 2012-09-01 ENCOUNTER — Ambulatory Visit (INDEPENDENT_AMBULATORY_CARE_PROVIDER_SITE_OTHER): Payer: Medicare Other | Admitting: Family Medicine

## 2012-09-01 ENCOUNTER — Encounter (HOSPITAL_COMMUNITY): Payer: Self-pay

## 2012-09-01 ENCOUNTER — Encounter: Payer: Self-pay | Admitting: Family Medicine

## 2012-09-01 ENCOUNTER — Other Ambulatory Visit: Payer: Self-pay | Admitting: Ophthalmology

## 2012-09-01 VITALS — BP 156/66 | HR 92 | Temp 98.0°F | Wt 219.2 lb

## 2012-09-01 DIAGNOSIS — Z23 Encounter for immunization: Secondary | ICD-10-CM | POA: Diagnosis not present

## 2012-09-01 DIAGNOSIS — E785 Hyperlipidemia, unspecified: Secondary | ICD-10-CM | POA: Diagnosis not present

## 2012-09-01 DIAGNOSIS — E119 Type 2 diabetes mellitus without complications: Secondary | ICD-10-CM

## 2012-09-01 DIAGNOSIS — I10 Essential (primary) hypertension: Secondary | ICD-10-CM | POA: Diagnosis not present

## 2012-09-01 LAB — COMPREHENSIVE METABOLIC PANEL
ALT: 31 U/L (ref 0–53)
AST: 37 U/L (ref 0–37)
Albumin: 3.3 g/dL — ABNORMAL LOW (ref 3.5–5.2)
Alkaline Phosphatase: 144 U/L — ABNORMAL HIGH (ref 39–117)
Potassium: 3.7 mEq/L (ref 3.5–5.1)
Sodium: 136 mEq/L (ref 135–145)
Total Protein: 7.7 g/dL (ref 6.0–8.3)

## 2012-09-01 LAB — TSH: TSH: 2.85 u[IU]/mL (ref 0.35–5.50)

## 2012-09-01 LAB — LDL CHOLESTEROL, DIRECT: Direct LDL: 49.7 mg/dL

## 2012-09-01 MED ORDER — TETRACAINE HCL 0.5 % OP SOLN: 1.0000 [drp] | OPHTHALMIC | Status: AC

## 2012-09-01 NOTE — H&P (Signed)
                  History & Physical:   DATE:     NAME:  Downs, Marcus J     [id number]       HISTORY OF PRESENT ILLNESS: Chief Eye Complaints blurry vision  & glaucoma   WORSE OD   HPI: EYES: Reports symptoms of vision disturbances.        LOCATION:      QUALITY/COURSE:   Reports condition is worsening.        INTENSITY/SEVERITY:   SEVERE    DURATION:   Reports the general length of symptoms to be weeks.      ONSET/TIMING:   Reports occurrence as 3 months ago.      CONTEXT/WHEN:   Reports usually associated with   MODIFIERS/TREATMENTS:  Improved by              ROS:   GEN- Constitutional: HENT: GEN - Endocrine: Reports symptoms of diabetes.    LUNGS/Respiratory:  HEART/Cardiovascular: Reports symptoms of hypertension.    ABD/Gastrointestinal:   Musculoskeletal (BJE): NEURO/Neurological: PSYCH/Psychiatric:    Is the pt oriented to time, place, person?YES  Mood depressed __ normal  agitated __  ACTIVE PROBLEMS: Primary open angle glaucoma   ICD#365.11  stable Nuclear cataract NOS   ICD#366.04  worse right eye  SURGERIES: Pick List - Surgeries  Cerebrovascular Accident (CVA or stroke]   ICD#436  1997  TOBACCO: Pick List - Tobacco - Summary  SOCIAL HISTORY: Starter Pick List - Social History  FAMILY HISTORY: Positive family history for  -   Negative family history for  -   PARENTS: CHILDREN: GRANDPARENTS: SIBLINGS: UNCLES/AUNTS: OTHERS/DISTANT:  ALLERGIES: Starter - Allergies - Summary:  [place]ICAL [place]AMINATION: [place]    OD [place] 20/         cc 20/100          OS [place] 20/         cc 20/50+   PUPILS: REACTIVE  EYELIDS & OCULAR ADNEXA normal with  SLE: Conjunctiva  quiet each eye  Cornea decreased tear film arcus each eye   anterior chamber  deep and quiet each eye  Iris Brown each eye  Lens +3 nuclear sclerosis OD, +2-3 nuclear sclerosis OS  Vitreous  CCT  Ta   in mmHg   16 OD           15 OS Time 12:20 PM  Gonio   Dilation tropicamide 1%  phenylephrine 2.5%  Fundus:  optic nerve  OD   70% cup pink                                                                  OS 70% cup pink   Macula   clear   OD         clear   OS   Vessels narrow arterioles no hemorrhages no microaneurysms  Periphery normal     Exam: GENERAL: Appearance: General appearance can be described as well-nourished, well-developed, and in no acute distress.    LYMPHATIC: HEAD, EARS, NOSE AND THROAT: Ears-Nose (external) Inspection: Externally, nose and ears are normal in appearance and without scars, lesions, or nodules.      Otoscopic Exam: External   auditory canals and tympanic membranes are normal.      Hearing assessment shows no problems with normal conversation.    Nose exam, internally, reveals nasal mucosa, septum and turbinates are unremarkable.    Teeth, Gingiva, and Lip Exams: No lesions or evidence of infection.      Oropharynx demonstrates oral mucosa, salivary glands, tongue, tonsils, posterior pharynx, hard-soft palates are normal.  EYES: see above  NECK: Neck tissue exam demonstrates no masses, symmetrical, and trachea is midline.      LUNGS and RESPIRATORY: Lung auscultation elicits no wheezing, rhonci, rales or rubs and with equal breath sounds.    Respiratory effort described as breathing is unlabored and chest movement is symmetrical.    HEART (Cardiovascular): Heart auscultation discovers regular rate and rhythm; no murmur, gallop or rub. Normal heart sounds.    ABDOMEN (Gastrointestinal): Mass/Tenderness Exam: Neither are present.     Liver/Spleen: No hepatomegaly or splenomegaly.   MUSCULOSKELETAL (BJE): Inspection-Palpation: left paralysis NEUROLOGICAL: right CVA  PSYCHIATRIC: Insight and judgment appear  both to be intact and appropriate.    Mood and affect are described as normal mood and full affect.    SKIN: Skin Inspection: No rashes or lesions.    H&P B/P:134/66 Resp:20 Pulse:74  ADMITTING DIAGNOSIS: Primary open  angle glaucoma   ICD#365.11  stable Nuclear cataract NOS   ICD#366.04  worse right eye  SURGICAL TREATMENT PLAN: Phacoemulsification with intraocular lens implant right eye the risks and benefits of the surgery have been reviewed with the patient and he agrees to proceed. Actions:    CPT Codes:     Form completed for work.  Actions:  Plans for New Diagnosis:  Lab/Tests:  X-rays:  Studies (other than lab or x-ray):  Old Records Requested:  Discuss with physician:  Summary updated:   Second Interpretation:    ___________________________ Marcus Downs, Jr.     

## 2012-09-01 NOTE — Assessment & Plan Note (Signed)
Goal A1c <8%. Check today, if high, titrate insulin accordingly. Wt Readings from Last 3 Encounters:  09/01/12 219 lb 4 oz (99.451 kg)  05/25/12 219 lb 4 oz (99.451 kg)  01/24/12 222 lb 1.3 oz (100.735 kg)

## 2012-09-01 NOTE — Progress Notes (Signed)
  Subjective:    Patient ID: Marcus Downs, male    DOB: May 16, 1941, 71 y.o.   MRN: [id number]  HPI CC: 3 mo f/u  HTN - bp elevated today.  No HA, vision changes, CP/tightness, SOB, leg swelling.   DM - saw eye doctor 05/2012.  Doesn't check sugars.  Goal A1c <8%.  No hypoglycemic sxs.  Denies paresthesias. Lab Results  Component Value Date   HGBA1C 8.4* 05/25/2012    HLD - compliant with caduet.  No myalgias.  Flu shot today.  Shingle shot today.  Review of Systems Per HPI    Objective:   Physical Exam  Nursing note and vitals reviewed. Constitutional: He appears well-developed and well-nourished. No distress.  HENT:  Head: Normocephalic and atraumatic.  Mouth/Throat: Oropharynx is clear and moist. No oropharyngeal exudate.  Eyes: Conjunctivae normal and EOM are normal. Pupils are equal, round, and reactive to light. No scleral icterus.  Cardiovascular: Normal rate, regular rhythm, normal heart sounds and intact distal pulses.   No murmur heard. Pulmonary/Chest: Effort normal and breath sounds normal. No respiratory distress. He has no wheezes. He has no rales.       Assessment & Plan:

## 2012-09-01 NOTE — Assessment & Plan Note (Signed)
Chronic, stable. Continue caduet. Check dLDL today.

## 2012-09-01 NOTE — Addendum Note (Signed)
Addended by: Josph Macho A on: 09/01/2012 10:34 AM   Modules accepted: Orders

## 2012-09-01 NOTE — Patient Instructions (Signed)
Flu shot and shingles shot otday. Blood work today. Call us with questions. Return in 3-4 months for follow up. If sugar staying high, we will call you with change in insulin dose.

## 2012-09-01 NOTE — Assessment & Plan Note (Signed)
Deteriorated today.  Check blood work. If remaining high next visit, change bp meds. One change at a time today.

## 2012-09-02 ENCOUNTER — Other Ambulatory Visit: Payer: Self-pay | Admitting: Family Medicine

## 2012-09-02 MED ORDER — METFORMIN HCL 850 MG PO TABS: 850.0000 mg | ORAL_TABLET | Freq: Two times a day (BID) | ORAL | Status: DC

## 2012-09-07 ENCOUNTER — Encounter (HOSPITAL_COMMUNITY): Payer: Self-pay

## 2012-09-07 ENCOUNTER — Encounter (HOSPITAL_COMMUNITY)
Admission: RE | Admit: 2012-09-07 | Discharge: 2012-09-07 | Disposition: A | Discharge status: Home / Self Care | Payer: Medicare Other | Source: Ambulatory Visit | Attending: Ophthalmology | Admitting: Ophthalmology

## 2012-09-07 ENCOUNTER — Telehealth: Payer: Self-pay | Admitting: Family Medicine

## 2012-09-07 ENCOUNTER — Ambulatory Visit (HOSPITAL_COMMUNITY)
Admission: RE | Admit: 2012-09-07 | Discharge: 2012-09-07 | Disposition: A | Discharge status: Home / Self Care | Payer: Medicare Other | Source: Ambulatory Visit | Attending: Ophthalmology | Admitting: Ophthalmology

## 2012-09-07 DIAGNOSIS — Z01810 Encounter for preprocedural cardiovascular examination: Secondary | ICD-10-CM | POA: Insufficient documentation

## 2012-09-07 DIAGNOSIS — Z01811 Encounter for preprocedural respiratory examination: Secondary | ICD-10-CM | POA: Diagnosis not present

## 2012-09-07 DIAGNOSIS — Z01818 Encounter for other preprocedural examination: Secondary | ICD-10-CM | POA: Insufficient documentation

## 2012-09-07 DIAGNOSIS — Z01812 Encounter for preprocedural laboratory examination: Secondary | ICD-10-CM | POA: Insufficient documentation

## 2012-09-07 DIAGNOSIS — J984 Other disorders of lung: Secondary | ICD-10-CM | POA: Diagnosis not present

## 2012-09-07 DIAGNOSIS — J9819 Other pulmonary collapse: Secondary | ICD-10-CM | POA: Diagnosis not present

## 2012-09-07 HISTORY — DX: Cerebral infarction, unspecified: I63.9

## 2012-09-07 LAB — CBC
HCT: 39.4 % (ref 39.0–52.0)
RDW: 16.4 % — ABNORMAL HIGH (ref 11.5–15.5)
WBC: 7.6 10*3/uL (ref 4.0–10.5)

## 2012-09-07 LAB — BASIC METABOLIC PANEL
Chloride: 101 mEq/L (ref 96–112)
Creatinine, Ser: 0.99 mg/dL (ref 0.50–1.35)
GFR calc Af Amer: >90 mL/min (ref >90)
Potassium: 3.5 mEq/L (ref 3.5–5.1)

## 2012-09-07 NOTE — Progress Notes (Signed)
Dr Sharen Hones called for prior ekg. Office note in epic

## 2012-09-07 NOTE — Pre-Procedure Instructions (Addendum)
20 ANIV GELL  09/07/2012   Your procedure is scheduled on:  09/09/12  Report to [facility] at1200* AM.  Call this number if you have problems the morning of surgery: [phone]   Remember:   Do not eat food or drink:After Midnight.    Take these medicines the morning of surgery with A SIP OF WATER: diltiazem  STOP aspirin   Do not wear jewelry,   Do not wear lotions, powders, or perfumes.   Do not shave 48 hours prior to surgery. Men may shave face and neck.  Do not bring valuables to the hospital.  Contacts, dentures or bridgework may not be worn into surgery.  Leave suitcase in the car. After surgery it may be brought to your room.  For patients admitted to the hospital, checkout time is 11:00 AM the day of discharge.   Patients discharged the day of surgery will not be allowed to drive home.  Name and phone number of your driver: Marcus Downs son [phone]  Special Instructions: Shower using CHG 2 nights before surgery and the night before surgery.  If you shower the day of surgery use CHG.  Use special wash - you have one bottle of CHG for all showers.  You should use approximately 1/3 of the bottle for each shower.   Please read over the following fact sheets that you were given: Pain Booklet, Coughing and Deep Breathing and Surgical Site Infection Prevention

## 2012-09-07 NOTE — Telephone Encounter (Signed)
Received request for medical clearance from Dr. Harlon Flor. See letter in response.  plz fax back.  Placed in Kim's box.

## 2012-09-07 NOTE — Telephone Encounter (Signed)
Letter faxed as directed.  

## 2012-09-08 ENCOUNTER — Other Ambulatory Visit: Payer: Self-pay | Admitting: Ophthalmology

## 2012-09-08 MED ORDER — PHENYLEPHRINE HCL 2.5 % OP SOLN
1.0000 [drp] | OPHTHALMIC | Status: AC
  Administered 2012-09-09: 1 [drp] via OPHTHALMIC

## 2012-09-08 MED ORDER — KETOROLAC TROMETHAMINE 0.5 % OP SOLN: 1.0000 [drp] | OPHTHALMIC | Status: DC

## 2012-09-08 MED ORDER — CYCLOPENTOLATE HCL 1 % OP SOLN: 1.0000 [drp] | OPHTHALMIC | Status: DC

## 2012-09-08 MED ORDER — TROPICAMIDE 1 % OP SOLN
1.0000 [drp] | OPHTHALMIC | Status: AC
  Administered 2012-09-09: 1 [drp] via OPHTHALMIC

## 2012-09-08 MED ORDER — GATIFLOXACIN 0.5 % OP SOLN: 1.0000 [drp] | OPHTHALMIC | Status: DC

## 2012-09-08 NOTE — Consult Note (Addendum)
Anesthesia chart review: Patient is a 71 year old male scheduled for phacoemulsification with intraocular implant, right eye on 09/09/2012 by Dr. Harlon Flor.  Procedure is posted to be done under MAC.  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  PCP is Dr. [name] [facility] [place]).  Dr. Sharen Hones cleared for this procedure from a medical standpoint since it is not planned for general anesthesia.  Labs noted.  Cr 0.99.  Glucose 242.  H/H 14.5/39.4.  He will get a CBG on arrival.  Chest x-ray on 09/07/2012 showed LLL atelectasis/scarring. No evidence of acute cardiopulmonary disease.   EKG on 09/07/12 showed SR with PACs, LAD, right BBB, possible lateral infarct (age undetermined).  Currently, there are no comparison EKGs available in [place], [place], or at [facility].  I reviewed above history, EKG, and clearance note with Anesthesiologist Dr. Noreene Larsson.  If asymptomatic from a CV standpoint then anticipates he can proceed with this procedure.  I have left a voicemail for Mr. Garciaperez to call me today, otherwise clinical correlation on the day of surgery.  Shonna Chock, PA-C 09/08/12 1140  Addendum: 09/08/12 1200 Received a phone call from Mr. Mandell.  He feels in his usual state of health.  He denies history of chest pain, SOB, or lower extremity edema.  He is able to ambulate around his home without dyspnea.  He is debilitated from his CVA and uses a walker in his home and a wheelchair when out and about.  He was treated at [facility] in 1997 for his CVA.  He denies known previous cardiac testing including EKG.  His only prior surgery was a lipoma excision from his back in the '80's.

## 2012-09-09 ENCOUNTER — Encounter (HOSPITAL_COMMUNITY): Payer: Self-pay | Admitting: *Deleted

## 2012-09-09 ENCOUNTER — Encounter (HOSPITAL_COMMUNITY): Payer: Self-pay | Admitting: Vascular Surgery

## 2012-09-09 ENCOUNTER — Encounter (HOSPITAL_COMMUNITY)
Admission: RE | Disposition: A | Discharge status: Home / Self Care | Payer: Self-pay | Source: Ambulatory Visit | Attending: Ophthalmology

## 2012-09-09 ENCOUNTER — Ambulatory Visit (HOSPITAL_COMMUNITY): Payer: Medicare Other | Admitting: Vascular Surgery

## 2012-09-09 ENCOUNTER — Ambulatory Visit (HOSPITAL_COMMUNITY)
Admission: RE | Admit: 2012-09-09 | Discharge: 2012-09-09 | Disposition: A | Discharge status: Home / Self Care | Payer: Medicare Other | Source: Ambulatory Visit | Attending: Ophthalmology | Admitting: Ophthalmology

## 2012-09-09 DIAGNOSIS — I69998 Other sequelae following unspecified cerebrovascular disease: Secondary | ICD-10-CM | POA: Insufficient documentation

## 2012-09-09 DIAGNOSIS — I10 Essential (primary) hypertension: Secondary | ICD-10-CM | POA: Diagnosis not present

## 2012-09-09 DIAGNOSIS — H269 Unspecified cataract: Secondary | ICD-10-CM | POA: Diagnosis not present

## 2012-09-09 DIAGNOSIS — H409 Unspecified glaucoma: Secondary | ICD-10-CM | POA: Diagnosis not present

## 2012-09-09 DIAGNOSIS — Z794 Long term (current) use of insulin: Secondary | ICD-10-CM | POA: Insufficient documentation

## 2012-09-09 DIAGNOSIS — E119 Type 2 diabetes mellitus without complications: Secondary | ICD-10-CM | POA: Insufficient documentation

## 2012-09-09 DIAGNOSIS — H4011X0 Primary open-angle glaucoma, stage unspecified: Secondary | ICD-10-CM | POA: Insufficient documentation

## 2012-09-09 HISTORY — PX: CATARACT EXTRACTION W/PHACO: SHX586

## 2012-09-09 SURGERY — PHACOEMULSIFICATION, CATARACT, WITH IOL INSERTION
Anesthesia: Monitor Anesthesia Care | Site: Eye | Laterality: Right | Wound class: Clean

## 2012-09-09 MED ORDER — EPINEPHRINE HCL 1 MG/ML IJ SOLN
INTRAOCULAR | Status: DC | PRN
  Administered 2012-09-09: 15:00:00

## 2012-09-09 MED ORDER — BSS IO SOLN
INTRAOCULAR | Status: DC | PRN
  Administered 2012-09-09: 15 mL via INTRAOCULAR

## 2012-09-09 MED ORDER — ACETAMINOPHEN 325 MG PO TABS
650.0000 mg | ORAL_TABLET | ORAL | Status: DC | PRN
  Filled 2012-09-09: qty 2

## 2012-09-09 MED ORDER — BSS IO SOLN
INTRAOCULAR | Status: AC
  Filled 2012-09-09: qty 500

## 2012-09-09 MED ORDER — HYALURONIDASE HUMAN 150 UNIT/ML IJ SOLN
INTRAMUSCULAR | Status: DC | PRN
  Administered 2012-09-09: 150 [IU] via SUBCUTANEOUS

## 2012-09-09 MED ORDER — PHENYLEPHRINE HCL 2.5 % OP SOLN
OPHTHALMIC | Status: AC
  Administered 2012-09-09: 1 [drp] via OPHTHALMIC
  Filled 2012-09-09: qty 3

## 2012-09-09 MED ORDER — TOBRAMYCIN 0.3 % OP OINT
TOPICAL_OINTMENT | OPHTHALMIC | Status: DC | PRN
  Administered 2012-09-09: 1 via OPHTHALMIC

## 2012-09-09 MED ORDER — EPINEPHRINE HCL 1 MG/ML IJ SOLN
INTRAMUSCULAR | Status: AC
  Filled 2012-09-09: qty 1

## 2012-09-09 MED ORDER — SODIUM CHLORIDE 0.9 % IV SOLN
INTRAVENOUS | Status: DC | PRN
  Administered 2012-09-09: 15:00:00 via INTRAVENOUS

## 2012-09-09 MED ORDER — BSS IO SOLN
INTRAOCULAR | Status: AC
  Filled 2012-09-09: qty 15

## 2012-09-09 MED ORDER — ACETYLCHOLINE CHLORIDE 1:100 IO SOLR
INTRAOCULAR | Status: DC | PRN
  Administered 2012-09-09: 20 mg via INTRAOCULAR

## 2012-09-09 MED ORDER — LIDOCAINE-EPINEPHRINE 2 %-1:100000 IJ SOLN
INTRAMUSCULAR | Status: AC
  Filled 2012-09-09: qty 1

## 2012-09-09 MED ORDER — PILOCARPINE HCL 4 % OP GEL
OPHTHALMIC | Status: AC
  Filled 2012-09-09: qty 4

## 2012-09-09 MED ORDER — TETRACAINE HCL 0.5 % OP SOLN
OPHTHALMIC | Status: AC
  Filled 2012-09-09: qty 2

## 2012-09-09 MED ORDER — LIDOCAINE-EPINEPHRINE 2 %-1:100000 IJ SOLN
INTRAMUSCULAR | Status: DC | PRN
  Administered 2012-09-09: 10 mL

## 2012-09-09 MED ORDER — GENTAMICIN SULFATE 40 MG/ML IJ SOLN
INTRAMUSCULAR | Status: AC
  Filled 2012-09-09: qty 2

## 2012-09-09 MED ORDER — NA CHONDROIT SULF-NA HYALURON 40-30 MG/ML IO SOLN
INTRAOCULAR | Status: DC | PRN
  Administered 2012-09-09: 0.5 mL via INTRAOCULAR

## 2012-09-09 MED ORDER — HYALURONIDASE HUMAN 150 UNIT/ML IJ SOLN
INTRAMUSCULAR | Status: AC
  Filled 2012-09-09: qty 1

## 2012-09-09 MED ORDER — DEXAMETHASONE SODIUM PHOSPHATE 10 MG/ML IJ SOLN
INTRAMUSCULAR | Status: AC
  Filled 2012-09-09: qty 1

## 2012-09-09 MED ORDER — TROPICAMIDE 1 % OP SOLN
OPHTHALMIC | Status: AC
  Administered 2012-09-09: 1 [drp] via OPHTHALMIC
  Filled 2012-09-09: qty 3

## 2012-09-09 MED ORDER — MIDAZOLAM HCL 5 MG/5ML IJ SOLN
INTRAMUSCULAR | Status: DC | PRN
  Administered 2012-09-09 (×2): .5 mg via INTRAVENOUS

## 2012-09-09 MED ORDER — TETRACAINE HCL 0.5 % OP SOLN
OPHTHALMIC | Status: DC | PRN
  Administered 2012-09-09: 1 [drp] via OPHTHALMIC

## 2012-09-09 MED ORDER — ACETYLCHOLINE CHLORIDE 1:100 IO SOLR
INTRAOCULAR | Status: AC
  Filled 2012-09-09: qty 1

## 2012-09-09 MED ORDER — KETOROLAC TROMETHAMINE 0.5 % OP SOLN: 1.0000 [drp] | OPHTHALMIC | Status: DC

## 2012-09-09 MED ORDER — CYCLOPENTOLATE HCL 1 % OP SOLN
OPHTHALMIC | Status: AC
  Filled 2012-09-09: qty 2

## 2012-09-09 MED ORDER — SODIUM HYALURONATE 10 MG/ML IO SOLN
INTRAOCULAR | Status: AC
  Filled 2012-09-09: qty 0.85

## 2012-09-09 MED ORDER — CYCLOPENTOLATE HCL 1 % OP SOLN: 1.0000 [drp] | OPHTHALMIC | Status: DC

## 2012-09-09 MED ORDER — GATIFLOXACIN 0.5 % OP SOLN
OPHTHALMIC | Status: AC
  Filled 2012-09-09: qty 2.5

## 2012-09-09 MED ORDER — PROVISC 10 MG/ML IO SOLN
INTRAOCULAR | Status: DC | PRN
  Administered 2012-09-09: .85 mL via INTRAOCULAR

## 2012-09-09 MED ORDER — KETOROLAC TROMETHAMINE 0.5 % OP SOLN
OPHTHALMIC | Status: AC
  Filled 2012-09-09: qty 5

## 2012-09-09 MED ORDER — NA CHONDROIT SULF-NA HYALURON 40-30 MG/ML IO SOLN
INTRAOCULAR | Status: AC
  Filled 2012-09-09: qty 1

## 2012-09-09 MED ORDER — LIDOCAINE HCL 2 % IJ SOLN
INTRAMUSCULAR | Status: AC
  Filled 2012-09-09: qty 20

## 2012-09-09 MED ORDER — SODIUM CHLORIDE 0.9 % IV SOLN
INTRAVENOUS | Status: DC
  Administered 2012-09-09: 14:00:00 via INTRAVENOUS

## 2012-09-09 MED ORDER — BUPIVACAINE HCL (PF) 0.75 % IJ SOLN
INTRAMUSCULAR | Status: DC | PRN
  Administered 2012-09-09: 10 mL

## 2012-09-09 MED ORDER — TOBRAMYCIN-DEXAMETHASONE 0.3-0.1 % OP OINT
TOPICAL_OINTMENT | OPHTHALMIC | Status: AC
  Filled 2012-09-09: qty 3.5

## 2012-09-09 SURGICAL SUPPLY — 33 items
APL SRG 3 HI ABS STRL LF PLS (MISCELLANEOUS) ×1
APPLICATOR COTTON TIP 6IN STRL (MISCELLANEOUS) ×2 IMPLANT
APPLICATOR DR MATTHEWS STRL (MISCELLANEOUS) ×2 IMPLANT
BLADE KERATOME 2.75 (BLADE) ×2 IMPLANT
CANNULA ANTERIOR CHAMBER 27GA (MISCELLANEOUS) ×2 IMPLANT
CLOTH BEACON ORANGE TIMEOUT ST (SAFETY) ×2 IMPLANT
DRAPE OPHTHALMIC 40X48 W POUCH (DRAPES) ×2 IMPLANT
DRAPE RETRACTOR (MISCELLANEOUS) ×2 IMPLANT
GLOVE BIO SURGEON STRL SZ8 (GLOVE) ×2 IMPLANT
GLOVE SS BIOGEL STRL SZ 6.5 (GLOVE) IMPLANT
GLOVE SUPERSENSE BIOGEL SZ 6.5 (GLOVE) ×1
GOWN STRL NON-REIN LRG LVL3 (GOWN DISPOSABLE) ×4 IMPLANT
KIT BASIN OR (CUSTOM PROCEDURE TRAY) ×2 IMPLANT
KIT ROOM TURNOVER OR (KITS) ×2 IMPLANT
KNIFE CRESCENT 2.5 55 ANG (BLADE) IMPLANT
LENS IOL ACRSF IQ PC 21.5 (Intraocular Lens) IMPLANT
LENS IOL ACRYSOF IQ POST 21.5 (Intraocular Lens) ×2 IMPLANT
MARKER SKIN DUAL TIP RULER LAB (MISCELLANEOUS) ×2 IMPLANT
NDL 25GX 5/8IN NON SAFETY (NEEDLE) ×1 IMPLANT
NEEDLE 25GX 5/8IN NON SAFETY (NEEDLE) ×2 IMPLANT
NS IRRIG 1000ML POUR BTL (IV SOLUTION) ×2 IMPLANT
PACK CATARACT CUSTOM (CUSTOM PROCEDURE TRAY) ×2 IMPLANT
PACK CATARACT MCHSCP (PACKS) ×2 IMPLANT
PAD ARMBOARD 7.5X6 YLW CONV (MISCELLANEOUS) ×4 IMPLANT
PROBE ANTERIOR 20G W/INFUS NDL (MISCELLANEOUS) ×1 IMPLANT
SPEAR EYE SURG WECK-CEL (MISCELLANEOUS) IMPLANT
SUT ETHILON 10 0 CS140 6 (SUTURE) ×2 IMPLANT
SUT SILK 4 0 C 3 735G (SUTURE) IMPLANT
SUT SILK 6 0 G 6 (SUTURE) IMPLANT
SUT VICRYL 8 0 TG140 8 (SUTURE) IMPLANT
SYR 3ML LL SCALE MARK (SYRINGE) IMPLANT
TOWEL OR 17X24 6PK STRL BLUE (TOWEL DISPOSABLE) ×4 IMPLANT
WATER STERILE IRR 1000ML POUR (IV SOLUTION) ×2 IMPLANT

## 2012-09-09 NOTE — Anesthesia Postprocedure Evaluation (Signed)
  Anesthesia Post-op Note  Patient: Marcus Downs  Procedure(s) Performed: Procedure(s) (LRB) with comments: CATARACT EXTRACTION PHACO AND INTRAOCULAR LENS PLACEMENT (IOC) (Right)  Patient Location: PACU  Anesthesia Type: MAC  Level of Consciousness: awake  Airway and Oxygen Therapy: Patient Spontanous Breathing  Post-op Pain: mild  Post-op Assessment: Post-op Vital signs reviewed  Post-op Vital Signs: Reviewed  Complications: No apparent anesthesia complications

## 2012-09-09 NOTE — Progress Notes (Signed)
1230  Have been giving eye gtts  As ordered....but unable to put them in the [facility]....DA

## 2012-09-09 NOTE — Anesthesia Preprocedure Evaluation (Signed)
Anesthesia Evaluation  Patient identified by MRN, date of birth, ID band Patient awake    Reviewed: Allergy & Precautions, H&P , NPO status , Patient's Chart, lab work & pertinent test results  Airway Mallampati: II TM Distance: >3 FB Neck ROM: Full    Dental No notable dental hx. (+) Edentulous Upper and Edentulous Lower   Pulmonary neg pulmonary ROS, former smoker,  breath sounds clear to auscultation  Pulmonary exam normal       Cardiovascular hypertension, Pt. on medications negative cardio ROS  Rhythm:Regular Rate:Normal     Neuro/Psych CVA, Residual Symptoms negative neurological ROS  negative psych ROS   GI/Hepatic negative GI ROS, Neg liver ROS,   Endo/Other  negative endocrine ROSdiabetes, Insulin Dependent and Oral Hypoglycemic Agents  Renal/GU negative Renal ROS  negative genitourinary   Musculoskeletal negative musculoskeletal ROS (+)   Abdominal   Peds negative pediatric ROS (+)  Hematology negative hematology ROS (+)   Anesthesia Other Findings   Reproductive/Obstetrics negative OB ROS                           Anesthesia Physical Anesthesia Plan  ASA: III  Anesthesia Plan: MAC   Post-op Pain Management:    Induction:   Airway Management Planned: Nasal Cannula  Additional Equipment:   Intra-op Plan:   Post-operative Plan:   Informed Consent: I have reviewed the patients History and Physical, chart, labs and discussed the procedure including the risks, benefits and alternatives for the proposed anesthesia with the patient or authorized representative who has indicated his/her understanding and acceptance.   Dental advisory given  Plan Discussed with: CRNA  Anesthesia Plan Comments:         Anesthesia Quick Evaluation

## 2012-09-09 NOTE — Op Note (Signed)
Preoperative diagnosis: Visually significant cataract right eye Postoperative diagnosis: Same Procedure phacoemulsification with intraocular lens implant Complications: None Anesthesia: 2% Xylocaine with epinephrine a 50-50 mixture 0.75% Marcaine with ample [place] Procedure the patient was taken to the operating room where his face was prepped and draped in the usual sterile fashion after he had been given a peribulbar block with the aforementioned local anesthetic agent. Following this with the operating microscope and positioned in the surgeon sitting temporally a Weck-Cel sponge was used to fixate the globe and a 15 blade was used to enter clear cornea at the 11:00 position peripherally. Viscoat was injected into the eye. Following this an additional Weck-Cel sponge was used to fixate the globe and a 2.75 mm keratome blade was used in a stepwise fashion through temporal clear cornea to into the eye. A bent 25-gauge needle was used to incise the anterior capsule and a continuous tear curvilinear capsulorrhexis was formed. Balanced salt solution was used to hydrodissect and hydrodelineate the nucleus. It was noted that the nucleus was 3-4+ firm therefore the phacoemulsification unit was used to sculpt for central troughs and a troughs were cracked using Kuglen hook and difficult. Following this all nuclear fragments were removed from the eye the posterior capsule remained intact. The irrigation aspiration handpiece was then used to remove cortical fibers from the posterior capsule all cortical fibers were removed except subincisional cortex which was removed after the intraocular lens implant had been placed. The intraocular lens implant was an Alcon AcrySof SN 60 WF 21.5 diopter SN #[id number] it was noted to have no defects and was loaded in the lens injector. Provisc was injected in the eye and intraocular lens implant was passed through the incision and injected into the capsular bag the lens unfolded  and a Kuglen hook was used to position the lens. Following this the eye was pressurized a 10-0 nylon suture was placed there being no leakage Miochol was injected into the eye the pupil was noted to come down round the intraocular lens implant was in good position all instrumentation was then removed from the eye and topical TobraDex ointment was applied to the eye. A patch and Fox U. were placed and the patient returned to recovery area in stable condition. [name] Junior M.D.

## 2012-09-09 NOTE — Transfer of Care (Signed)
Immediate Anesthesia Transfer of Care Note  Patient: Marcus Downs  Procedure(s) Performed: Procedure(s) (LRB) with comments: CATARACT EXTRACTION PHACO AND INTRAOCULAR LENS PLACEMENT (IOC) (Right)  Patient Location: PACU  Anesthesia Type: MAC  Level of Consciousness: awake  Airway & Oxygen Therapy: Patient Spontanous Breathing  Post-op Assessment: Report given to PACU RN  Post vital signs: Reviewed and stable  Complications: No apparent anesthesia complications

## 2012-09-09 NOTE — H&P (View-Only) (Signed)
History & Physical:   DATE:     NAME:  Marcus Downs, Marcus Downs     [id number]       HISTORY OF PRESENT ILLNESS: Chief Eye Complaints blurry vision  & glaucoma   WORSE OD   HPI: EYES: Reports symptoms of vision disturbances.        LOCATION:      QUALITY/COURSE:   Reports condition is worsening.        INTENSITY/SEVERITY:   SEVERE    DURATION:   Reports the general length of symptoms to be weeks.      ONSET/TIMING:   Reports occurrence as 3 months ago.      CONTEXT/WHEN:   Reports usually associated with   MODIFIERS/TREATMENTS:  Improved by              ROS:   GEN- Constitutional: HENT: GEN - Endocrine: Reports symptoms of diabetes.    LUNGS/Respiratory:  HEART/Cardiovascular: Reports symptoms of hypertension.    ABD/Gastrointestinal:   Musculoskeletal (BJE): NEURO/Neurological: PSYCH/Psychiatric:    Is the pt oriented to time, place, person?YES  Mood depressed __ normal  agitated __  ACTIVE PROBLEMS: Primary open angle glaucoma   ICD#365.11  stable Nuclear cataract NOS   ICD#366.04  worse right eye  SURGERIES: Pick List - Surgeries  Cerebrovascular Accident (CVA or stroke]   ICD#436  1997  TOBACCO: Pick List - Tobacco - Summary  SOCIAL HISTORY: Starter Pick List - Social History  FAMILY HISTORY: Positive family history for  -   Negative family history for  -   PARENTS: CHILDREN: GRANDPARENTS: SIBLINGS: UNCLES/AUNTS: OTHERS/DISTANT:  ALLERGIES: Starter - Allergies - Summary:  [place]YSICAL EXAMINATION: [place]    OD [place] 20/         cc 20/100          OS [place] 20/         cc 20/50+   PUPILS: REACTIVE  EYELIDS & OCULAR ADNEXA normal with  SLE: Conjunctiva  quiet each eye  Cornea decreased tear film arcus each eye   anterior chamber  deep and quiet each eye  Iris Brown each eye  Lens +3 nuclear sclerosis OD, +2-3 nuclear sclerosis OS  Vitreous  CCT  Ta   in mmHg   16 OD           15 OS Time 12:20 PM  Gonio   Dilation tropicamide 1%  phenylephrine 2.5%  Fundus:  optic nerve  OD   70% cup pink                                                                  OS 70% cup pink   Macula   clear   OD         clear   OS   Vessels narrow arterioles no hemorrhages no microaneurysms  Periphery normal     Exam: GENERAL: Appearance: General appearance can be described as well-nourished, well-developed, and in no acute distress.    LYMPHATIC: HEAD, EARS, NOSE AND THROAT: Ears-Nose (external) Inspection: Externally, nose and ears are normal in appearance and without scars, lesions, or nodules.      Otoscopic Exam: External  auditory canals and tympanic membranes are normal.      Hearing assessment shows no problems with normal conversation.    Nose exam, internally, reveals nasal mucosa, septum and turbinates are unremarkable.    Teeth, Gingiva, and Lip Exams: No lesions or evidence of infection.      Oropharynx demonstrates oral mucosa, salivary glands, tongue, tonsils, posterior pharynx, hard-soft palates are normal.  EYES: see above  NECK: Neck tissue exam demonstrates no masses, symmetrical, and trachea is midline.      LUNGS and RESPIRATORY: Lung auscultation elicits no wheezing, rhonci, rales or rubs and with equal breath sounds.    Respiratory effort described as breathing is unlabored and chest movement is symmetrical.    HEART (Cardiovascular): Heart auscultation discovers regular rate and rhythm; no murmur, gallop or rub. Normal heart sounds.    ABDOMEN (Gastrointestinal): Mass/Tenderness Exam: Neither are present.     Liver/Spleen: No hepatomegaly or splenomegaly.   MUSCULOSKELETAL (BJE): Inspection-Palpation: left paralysis NEUROLOGICAL: right CVA  PSYCHIATRIC: Insight and judgment appear  both to be intact and appropriate.    Mood and affect are described as normal mood and full affect.    SKIN: Skin Inspection: No rashes or lesions.    H&P B/P:134/66 Resp:20 Pulse:74  ADMITTING DIAGNOSIS: Primary open  angle glaucoma   ICD#365.11  stable Nuclear cataract NOS   ICD#366.04  worse right eye  SURGICAL TREATMENT PLAN: Phacoemulsification with intraocular lens implant right eye the risks and benefits of the surgery have been reviewed with the patient and he agrees to proceed. Actions:    CPT Codes:     Form completed for work.  Actions:  Plans for New Diagnosis:  Lab/Tests:  X-rays:  Studies (other than lab or x-ray):  Old Records Requested:  Discuss with physician:  Summary updated:   Second Interpretation:    ___________________________ [name], Montez Hageman.

## 2012-09-09 NOTE — Preoperative (Signed)
Beta Blockers   Reason not to administer Beta Blockers:Not Applicable 

## 2012-09-09 NOTE — Interval H&P Note (Signed)
History and Physical Interval Note:  09/09/2012 2:26 PM  Marcus Downs  has presented today for surgery, with the diagnosis of GLYCOMA  The various methods of treatment have been discussed with the patient and family. After consideration of risks, benefits and other options for treatment, the patient has consented to  Procedure(s) (LRB) with comments: CATARACT EXTRACTION PHACO AND INTRAOCULAR LENS PLACEMENT (IOC) (Right) as a surgical intervention .  The patient's history has been reviewed, patient examined, no change in status, stable for surgery.  I have reviewed the patient's chart and labs.  Questions were answered to the patient's satisfaction.     Davelyn Gwinn

## 2012-09-10 ENCOUNTER — Encounter (HOSPITAL_COMMUNITY): Payer: Self-pay | Admitting: Ophthalmology

## 2012-09-10 LAB — GLUCOSE, CAPILLARY: Glucose-Capillary: 162 mg/dL — ABNORMAL HIGH (ref 70–99)

## 2012-10-17 ENCOUNTER — Other Ambulatory Visit: Payer: Self-pay | Admitting: Family Medicine

## 2012-12-02 ENCOUNTER — Ambulatory Visit (INDEPENDENT_AMBULATORY_CARE_PROVIDER_SITE_OTHER): Payer: Medicare Other | Admitting: Family Medicine

## 2012-12-02 ENCOUNTER — Encounter: Payer: Self-pay | Admitting: Family Medicine

## 2012-12-02 VITALS — BP 148/84 | HR 92 | Temp 98.1°F | Wt 211.5 lb

## 2012-12-02 DIAGNOSIS — L608 Other nail disorders: Secondary | ICD-10-CM | POA: Diagnosis not present

## 2012-12-02 DIAGNOSIS — I10 Essential (primary) hypertension: Secondary | ICD-10-CM

## 2012-12-02 DIAGNOSIS — E785 Hyperlipidemia, unspecified: Secondary | ICD-10-CM | POA: Diagnosis not present

## 2012-12-02 DIAGNOSIS — E119 Type 2 diabetes mellitus without complications: Secondary | ICD-10-CM

## 2012-12-02 MED ORDER — DILTIAZEM HCL ER COATED BEADS 360 MG PO CP24: 360.0000 mg | ORAL_CAPSULE | Freq: Every day | ORAL | Status: DC

## 2012-12-02 NOTE — Assessment & Plan Note (Signed)
Discussed podiatry referral.  Pt will consider.

## 2012-12-02 NOTE — Patient Instructions (Signed)
Increase diltiazem to 360mg  daily - new dose sent into pharmacy. Blood work today. Return in 3-4 months for follow up diabetes. Good to see you today. think about podiatry referral.

## 2012-12-02 NOTE — Assessment & Plan Note (Signed)
Chronic, stable. continue caduet.

## 2012-12-02 NOTE — Assessment & Plan Note (Signed)
Chronic. Recent increase in meformin - check A1c today

## 2012-12-02 NOTE — Progress Notes (Signed)
  Subjective:    Patient ID: Marcus Downs, male    DOB: 07-23-1941, 72 y.o.   MRN: [id number]  HPI CC: DM f/u  Recently had cataract surgery, vision much improved.  Only did R eye.  L eye pending, scheduled for this year.  Some elevated blood pressurej readings at hospital.  DM - doesn't check sugars.  Recently saw eye doctor.  No paresthesias.   Lab Results  Component Value Date   HGBA1C 8.7* 09/01/2012   HTN - compliant with meds.  Tolerating well. BP Readings from Last 3 Encounters:  12/02/12 148/84  09/09/12 165/83  09/09/12 165/83   HLD - compliant with caduet (lipitor 80mg  ) Review of Systems Per HPI    Objective:   Physical Exam  Nursing note and vitals reviewed. Constitutional: He appears well-developed and well-nourished. No distress.  HENT:  Head: Normocephalic and atraumatic.  Mouth/Throat: Oropharynx is clear and moist. No oropharyngeal exudate.  Eyes: Conjunctivae normal and EOM are normal. Pupils are equal, round, and reactive to light. No scleral icterus.  Neck: Normal range of motion. Neck supple.  Cardiovascular: Normal rate, regular rhythm, normal heart sounds and intact distal pulses.   No murmur heard. Pulmonary/Chest: Effort normal and breath sounds normal. No respiratory distress. He has no wheezes. He has no rales.  Musculoskeletal: He exhibits no edema.       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  Skin: Skin is warm and dry. No rash noted.  Psychiatric: He has a normal mood and affect.       Assessment & Plan:

## 2012-12-02 NOTE — Assessment & Plan Note (Signed)
Chronic, stable. Slightly above goal. Increase cardizem to 360mg  daily.  Continue other antihypertensives.

## 2012-12-04 ENCOUNTER — Encounter: Payer: Self-pay | Admitting: *Deleted

## 2012-12-11 ENCOUNTER — Other Ambulatory Visit: Payer: Self-pay | Admitting: Family Medicine

## 2013-01-15 ENCOUNTER — Other Ambulatory Visit: Payer: Self-pay | Admitting: Family Medicine

## 2013-03-04 DIAGNOSIS — H4011X0 Primary open-angle glaucoma, stage unspecified: Secondary | ICD-10-CM | POA: Diagnosis not present

## 2013-03-04 DIAGNOSIS — H26499 Other secondary cataract, unspecified eye: Secondary | ICD-10-CM | POA: Diagnosis not present

## 2013-03-18 HISTORY — PX: US ECHOCARDIOGRAPHY: HXRAD669

## 2013-03-22 DIAGNOSIS — H4011X0 Primary open-angle glaucoma, stage unspecified: Secondary | ICD-10-CM | POA: Diagnosis not present

## 2013-03-22 DIAGNOSIS — H47239 Glaucomatous optic atrophy, unspecified eye: Secondary | ICD-10-CM | POA: Diagnosis not present

## 2013-03-22 DIAGNOSIS — H2510 Age-related nuclear cataract, unspecified eye: Secondary | ICD-10-CM | POA: Diagnosis not present

## 2013-03-22 DIAGNOSIS — H409 Unspecified glaucoma: Secondary | ICD-10-CM | POA: Diagnosis not present

## 2013-03-23 ENCOUNTER — Encounter: Payer: Self-pay | Admitting: Family Medicine

## 2013-03-23 ENCOUNTER — Ambulatory Visit (INDEPENDENT_AMBULATORY_CARE_PROVIDER_SITE_OTHER): Payer: Medicare Other | Admitting: Family Medicine

## 2013-03-23 VITALS — BP 148/68 | HR 86 | Temp 98.7°F | Wt 204.8 lb

## 2013-03-23 DIAGNOSIS — Z01818 Encounter for other preprocedural examination: Secondary | ICD-10-CM | POA: Diagnosis not present

## 2013-03-23 DIAGNOSIS — E119 Type 2 diabetes mellitus without complications: Secondary | ICD-10-CM

## 2013-03-23 DIAGNOSIS — I10 Essential (primary) hypertension: Secondary | ICD-10-CM

## 2013-03-23 DIAGNOSIS — R9431 Abnormal electrocardiogram [ECG] [EKG]: Secondary | ICD-10-CM | POA: Diagnosis not present

## 2013-03-23 NOTE — Assessment & Plan Note (Signed)
Chronic, stable. Improved control on increased cardizem, still slightly above goal but hesitant to add another med to 4 antihypertensive regimen. No changes today. Check Cr today. Lab Results  Component Value Date   CREATININE 0.99 09/07/2012

## 2013-03-23 NOTE — Progress Notes (Signed)
Subjective:    Patient ID: Marcus Downs, male    DOB: [birth year], 72 y.o.   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  Had R cataract surgery done 08/2012.  Under local anesthesia.  Tolerated procedure very well, but did have elevated blood pressures perioperatively.  I'm unsure how high pressures ranged.  Last visit here, we increased diltiazem to 360mg  daily and pt states he has been feeling better on this dose.   Lab Results  Component Value Date   HGBA1C 6.9* 12/02/2012  Denies hypoglycemic sxs such as dizziness, tremors, shakes.  Does not check sugars 2/2 disability. Denies recent headaches, chest pain or tightness, dyspnea, leg swelling.  Overall sedentary 2/2 hemiparesis.  BP Readings from Last 3 Encounters:  03/23/13 148/68  12/02/12 148/84  09/09/12 165/83   Medications and allergies reviewed and updated in chart.  Past histories reviewed and updated if relevant as below. Patient Active Problem List   Diagnosis Date Noted  . Medicare annual wellness visit, initial 05/25/2012  . Skin rash 01/24/2012  . CVA WITH LEFT HEMIPARESIS 12/20/2010  . DYSTROPHIC NAILS 09/10/2010  . RENAL INSUFFICIENCY 04/13/2009  . GERD 03/30/2009  . DIABETES MELLITUS, TYPE II 05/04/2007  . HYPERLIPIDEMIA 05/04/2007  . HYPERTENSION 05/04/2007  . SYMPTOM, INCONTINENCE, MIXED, URGE/STRESS 05/04/2007  . HX, PERSONAL, TOBACCO USE 05/04/2007   Past Medical History  Diagnosis Date  . Diabetes mellitus type II 1992  . HLD (hyperlipidemia) 10/1998  . HTN (hypertension) 10/1998  . 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 CRI (chronic renal insufficiency)    Past Surgical History  Procedure Laterality Date  . Lipoma removal  1970's    Right shoulder  . Cataract extraction w/phaco  09/09/2012     Procedure: CATARACT EXTRACTION PHACO AND INTRAOCULAR LENS PLACEMENT (IOC);  Surgeon: [name], MD;  Location: [facility] OR;  Service: Ophthalmology;  Laterality: Right;   History  Substance Use Topics  . Smoking status: Former Smoker -- 1.00 packs/day for 30 years    Types: Cigarettes    Quit date: 09/07/1996  . Smokeless tobacco: Never Used     Comment: quit after CVA  . Alcohol Use: No   Family History  Problem Relation Age of Onset  . Heart attack Father 26  . Hypertension Sister     Multiple medical problems  . Stroke Neg Hx   . Cancer Neg Hx   . Diabetes Sister    Allergies  Allergen Reactions  . Tape Rash    Burns   Current Outpatient Prescriptions on File Prior to Visit  Medication Sig Dispense Refill  . amLODipine-atorvastatin (CADUET) 10-80 MG per tablet TAKE ONE TABLET BY MOUTH EVERY DAY  30 tablet  11  . aspirin 325 MG EC tablet Take 325 mg by mouth daily. For stroke prevention        . diltiazem (CARDIZEM CD) 360 MG 24 hr capsule Take 1 capsule (360 mg total) by mouth daily.  31 capsule  11  . insulin aspart protamine-insulin aspart (NOVOLOG 70/30) (70-30) 100 UNIT/ML injection Inject 25-30 Units into the skin 2 (two) times daily with a meal. 30 units in AM; 25 Units in PM      . KLOR-CON M10 10 MEQ tablet TAKE TWO TABLETS BY MOUTH EVERY DAY  60 tablet  11  . latanoprost (XALATAN) 0.005 % ophthalmic solution Place 1 drop into both eyes daily.       Marland Kitchen losartan-hydrochlorothiazide (HYZAAR) 100-25 MG per tablet TAKE ONE TABLET BY MOUTH EVERY DAY  30 tablet  11  . metFORMIN (GLUCOPHAGE) 850 MG tablet Take 1 tablet (850 mg total) by mouth 2 (two) times daily with a meal.  60 tablet  11  . Misc Natural Products (PROSTATE SUPPORT PO) Take 1 tablet by mouth 2 (two) times daily. Super beta prostate       No current facility-administered medications on file prior to visit.     Review of Systems Per HPI    Objective:   Physical Exam  Nursing note and vitals  reviewed. Constitutional: He appears well-developed and well-nourished. No distress.  HENT:  Head: Normocephalic and atraumatic.  Mouth/Throat: Oropharynx is clear and moist. No oropharyngeal exudate.  Cardiovascular: Normal rate, regular rhythm, normal heart sounds and intact distal pulses.   No murmur heard. Pulmonary/Chest: Effort normal and breath sounds normal. No respiratory distress. He has no wheezes. He has no rales.  Musculoskeletal: He exhibits edema (tr pedal edema).  Skin: Skin is warm and dry. No rash noted.       Assessment & Plan:

## 2013-03-23 NOTE — Patient Instructions (Addendum)
Good to see you today. EKG and blood work today. Pass by [facility]' office for echocardiogram. We will fax clearance note to Dr. Harlon Flor.

## 2013-03-23 NOTE — Assessment & Plan Note (Signed)
Check A1c today to ensure not much lower than high 6's as would want to avoid hypoglycemia in this patient who does not check sugars.

## 2013-03-23 NOTE — Assessment & Plan Note (Addendum)
Tolerated first cataract extraction well, except for elevated blood pressure readings perioperatively. We have increased cardizem to 360mg  XR daily. Blood work today (BMP, A1c).  Hgb 17.5 (08/2012), will not repeat. CXR on 08/2012 with LLL atx/scarring.  I don't think we need repeat as no recent changes noted from pulm perspective. Will leave perioperative aspirin decision to ophthalmologist - has been very stable on daily 325mg  aspirin dose. EKG today - LAD, RBBB, LAHB, no acute ST/T changes.   I will obtain upcoming echocardiogram to further evaluate abnormal EKG however I don't see need to delay cataract surgery for this.  Depending on results of echo, consider cardiology referral. Assuming no GETA, anticipate adequate to proceed with low risk cataract extraction.  EKG - RBBB, LAD, LAHB.  No acute ST /T changes.

## 2013-03-24 ENCOUNTER — Encounter (HOSPITAL_COMMUNITY): Payer: Self-pay | Admitting: Pharmacy Technician

## 2013-03-24 LAB — BASIC METABOLIC PANEL
BUN: 16 mg/dL (ref 6–23)
CO2: 29 mEq/L (ref 19–32)
Chloride: 104 mEq/L (ref 96–112)
GFR: 76.72 mL/min (ref >60.00)
Glucose, Bld: 76 mg/dL (ref 70–99)
Potassium: 4.2 mEq/L (ref 3.5–5.1)

## 2013-03-25 ENCOUNTER — Encounter: Payer: Self-pay | Admitting: *Deleted

## 2013-03-26 ENCOUNTER — Ambulatory Visit (HOSPITAL_COMMUNITY): Payer: Medicare Other | Attending: Cardiology | Admitting: Radiology

## 2013-03-26 DIAGNOSIS — Z87891 Personal history of nicotine dependence: Secondary | ICD-10-CM | POA: Diagnosis not present

## 2013-03-26 DIAGNOSIS — I4510 Unspecified right bundle-branch block: Secondary | ICD-10-CM | POA: Insufficient documentation

## 2013-03-26 DIAGNOSIS — I10 Essential (primary) hypertension: Secondary | ICD-10-CM | POA: Diagnosis not present

## 2013-03-26 DIAGNOSIS — Z8673 Personal history of transient ischemic attack (TIA), and cerebral infarction without residual deficits: Secondary | ICD-10-CM | POA: Diagnosis not present

## 2013-03-26 DIAGNOSIS — R9431 Abnormal electrocardiogram [ECG] [EKG]: Secondary | ICD-10-CM | POA: Diagnosis not present

## 2013-03-26 DIAGNOSIS — E119 Type 2 diabetes mellitus without complications: Secondary | ICD-10-CM | POA: Diagnosis not present

## 2013-03-26 DIAGNOSIS — E785 Hyperlipidemia, unspecified: Secondary | ICD-10-CM | POA: Insufficient documentation

## 2013-03-26 NOTE — Progress Notes (Signed)
Echocardiogram performed.  

## 2013-03-28 ENCOUNTER — Encounter: Payer: Self-pay | Admitting: Family Medicine

## 2013-03-30 ENCOUNTER — Encounter (HOSPITAL_COMMUNITY): Payer: Self-pay | Admitting: *Deleted

## 2013-03-31 ENCOUNTER — Other Ambulatory Visit: Payer: Self-pay | Admitting: Ophthalmology

## 2013-03-31 ENCOUNTER — Ambulatory Visit (HOSPITAL_COMMUNITY): Payer: Medicare Other | Admitting: Certified Registered"

## 2013-03-31 ENCOUNTER — Encounter (HOSPITAL_COMMUNITY): Payer: Self-pay | Admitting: Certified Registered"

## 2013-03-31 ENCOUNTER — Ambulatory Visit (HOSPITAL_COMMUNITY)
Admission: RE | Admit: 2013-03-31 | Discharge: 2013-03-31 | Disposition: A | Discharge status: Home / Self Care | Payer: Medicare Other | Source: Ambulatory Visit | Attending: Ophthalmology | Admitting: Ophthalmology

## 2013-03-31 ENCOUNTER — Ambulatory Visit: Payer: Medicare Other | Admitting: Family Medicine

## 2013-03-31 ENCOUNTER — Encounter: Payer: Self-pay | Admitting: *Deleted

## 2013-03-31 ENCOUNTER — Encounter (HOSPITAL_COMMUNITY): Payer: Self-pay | Admitting: *Deleted

## 2013-03-31 ENCOUNTER — Encounter (HOSPITAL_COMMUNITY)
Admission: RE | Disposition: A | Discharge status: Home / Self Care | Payer: Self-pay | Source: Ambulatory Visit | Attending: Ophthalmology

## 2013-03-31 DIAGNOSIS — H269 Unspecified cataract: Secondary | ICD-10-CM | POA: Diagnosis not present

## 2013-03-31 DIAGNOSIS — H2510 Age-related nuclear cataract, unspecified eye: Secondary | ICD-10-CM | POA: Diagnosis not present

## 2013-03-31 DIAGNOSIS — H409 Unspecified glaucoma: Secondary | ICD-10-CM | POA: Diagnosis not present

## 2013-03-31 HISTORY — PX: CATARACT EXTRACTION W/PHACO: SHX586

## 2013-03-31 LAB — GLUCOSE, CAPILLARY
Glucose-Capillary: 104 mg/dL — ABNORMAL HIGH (ref 70–99)
Glucose-Capillary: 111 mg/dL — ABNORMAL HIGH (ref 70–99)

## 2013-03-31 LAB — BASIC METABOLIC PANEL
Chloride: 99 mEq/L (ref 96–112)
GFR calc Af Amer: >90 mL/min (ref >90)
GFR calc non Af Amer: 81 mL/min — ABNORMAL LOW (ref >90)
Potassium: 3.1 mEq/L — ABNORMAL LOW (ref 3.5–5.1)

## 2013-03-31 LAB — CBC
Hemoglobin: 15 g/dL (ref 13.0–17.0)
MCHC: 36.5 g/dL — ABNORMAL HIGH (ref 30.0–36.0)
RBC: 5.56 MIL/uL (ref 4.22–5.81)
WBC: 8.2 10*3/uL (ref 4.0–10.5)

## 2013-03-31 SURGERY — PHACOEMULSIFICATION, CATARACT, WITH IOL INSERTION
Anesthesia: Monitor Anesthesia Care | Site: Eye | Laterality: Left | Wound class: Clean

## 2013-03-31 MED ORDER — LIDOCAINE HCL (CARDIAC) 20 MG/ML IV SOLN
INTRAVENOUS | Status: DC | PRN
  Administered 2013-03-31: 40 mg via INTRAVENOUS

## 2013-03-31 MED ORDER — NA CHONDROIT SULF-NA HYALURON 40-30 MG/ML IO SOLN
INTRAOCULAR | Status: AC
  Filled 2013-03-31: qty 0.5

## 2013-03-31 MED ORDER — TROPICAMIDE 1 % OP SOLN
1.0000 [drp] | OPHTHALMIC | Status: AC
  Administered 2013-03-31 (×2): 1 [drp] via OPHTHALMIC

## 2013-03-31 MED ORDER — HYALURONIDASE HUMAN 150 UNIT/ML IJ SOLN
INTRAMUSCULAR | Status: AC
  Filled 2013-03-31: qty 1

## 2013-03-31 MED ORDER — DEXAMETHASONE SODIUM PHOSPHATE 10 MG/ML IJ SOLN
INTRAMUSCULAR | Status: AC
  Filled 2013-03-31: qty 1

## 2013-03-31 MED ORDER — BSS IO SOLN
INTRAOCULAR | Status: AC
  Filled 2013-03-31: qty 500

## 2013-03-31 MED ORDER — EPINEPHRINE HCL 1 MG/ML IJ SOLN
INTRAMUSCULAR | Status: AC
  Filled 2013-03-31: qty 1

## 2013-03-31 MED ORDER — ACETYLCHOLINE CHLORIDE 1:100 IO SOLR
INTRAOCULAR | Status: DC | PRN
  Administered 2013-03-31: 20 mg via INTRAOCULAR

## 2013-03-31 MED ORDER — NA CHONDROIT SULF-NA HYALURON 40-30 MG/ML IO SOLN
INTRAOCULAR | Status: DC | PRN
  Administered 2013-03-31: 0.5 mL via INTRAOCULAR

## 2013-03-31 MED ORDER — CYCLOPENTOLATE HCL 1 % OP SOLN
1.0000 [drp] | OPHTHALMIC | Status: AC
  Administered 2013-03-31: 1 [drp] via OPHTHALMIC

## 2013-03-31 MED ORDER — TOBRAMYCIN-DEXAMETHASONE 0.3-0.1 % OP OINT
TOPICAL_OINTMENT | OPHTHALMIC | Status: AC
  Filled 2013-03-31: qty 3.5

## 2013-03-31 MED ORDER — EPINEPHRINE HCL 1 MG/ML IJ SOLN
INTRAOCULAR | Status: DC | PRN
  Administered 2013-03-31 (×2)

## 2013-03-31 MED ORDER — SODIUM CHLORIDE 0.9 % IV SOLN
INTRAVENOUS | Status: DC
  Administered 2013-03-31: 11:00:00 via INTRAVENOUS

## 2013-03-31 MED ORDER — TETRACAINE HCL 0.5 % OP SOLN: 1.0000 [drp] | OPHTHALMIC | Status: DC

## 2013-03-31 MED ORDER — CYCLOPENTOLATE HCL 1 % OP SOLN
OPHTHALMIC | Status: AC
  Administered 2013-03-31: 1 [drp] via OPHTHALMIC
  Filled 2013-03-31: qty 2

## 2013-03-31 MED ORDER — PROPOFOL 10 MG/ML IV BOLUS
INTRAVENOUS | Status: DC | PRN
  Administered 2013-03-31: 30 mg via INTRAVENOUS
  Administered 2013-03-31: 50 mg via INTRAVENOUS

## 2013-03-31 MED ORDER — GENTAMICIN SULFATE 40 MG/ML IJ SOLN
INTRAMUSCULAR | Status: AC
  Filled 2013-03-31: qty 2

## 2013-03-31 MED ORDER — LIDOCAINE-EPINEPHRINE 2 %-1:100000 IJ SOLN
INTRAMUSCULAR | Status: AC
  Filled 2013-03-31: qty 1

## 2013-03-31 MED ORDER — TOBRAMYCIN 0.3 % OP OINT
TOPICAL_OINTMENT | OPHTHALMIC | Status: DC | PRN
  Administered 2013-03-31: 1 via OPHTHALMIC

## 2013-03-31 MED ORDER — GATIFLOXACIN 0.5 % OP SOLN
1.0000 [drp] | OPHTHALMIC | Status: AC
  Administered 2013-03-31 (×2): 1 [drp] via OPHTHALMIC

## 2013-03-31 MED ORDER — BSS IO SOLN
INTRAOCULAR | Status: DC | PRN
  Administered 2013-03-31: 15 mL via INTRAOCULAR

## 2013-03-31 MED ORDER — ACETYLCHOLINE CHLORIDE 1:100 IO SOLR
INTRAOCULAR | Status: AC
  Filled 2013-03-31: qty 1

## 2013-03-31 MED ORDER — SODIUM HYALURONATE 10 MG/ML IO SOLN
INTRAOCULAR | Status: AC
  Filled 2013-03-31: qty 0.85

## 2013-03-31 MED ORDER — LIDOCAINE HCL 2 % IJ SOLN
INTRAMUSCULAR | Status: AC
  Filled 2013-03-31: qty 20

## 2013-03-31 MED ORDER — PHENYLEPHRINE HCL 2.5 % OP SOLN
1.0000 [drp] | OPHTHALMIC | Status: AC
  Administered 2013-03-31 (×2): 1 [drp] via OPHTHALMIC

## 2013-03-31 MED ORDER — BSS IO SOLN
INTRAOCULAR | Status: AC
  Filled 2013-03-31: qty 15

## 2013-03-31 MED ORDER — PHENYLEPHRINE HCL 2.5 % OP SOLN
OPHTHALMIC | Status: AC
  Administered 2013-03-31: 1 [drp] via OPHTHALMIC
  Filled 2013-03-31: qty 3

## 2013-03-31 MED ORDER — SODIUM HYALURONATE 10 MG/ML IO SOLN
INTRAOCULAR | Status: DC | PRN
  Administered 2013-03-31: 0.85 mL via INTRAOCULAR

## 2013-03-31 MED ORDER — KETOROLAC TROMETHAMINE 0.5 % OP SOLN
OPHTHALMIC | Status: AC
  Administered 2013-03-31: 1 [drp] via OPHTHALMIC
  Filled 2013-03-31: qty 5

## 2013-03-31 MED ORDER — TROPICAMIDE 1 % OP SOLN
OPHTHALMIC | Status: AC
  Administered 2013-03-31: 1 [drp] via OPHTHALMIC
  Filled 2013-03-31: qty 3

## 2013-03-31 MED ORDER — GATIFLOXACIN 0.5 % OP SOLN
OPHTHALMIC | Status: AC
  Administered 2013-03-31: 1 [drp] via OPHTHALMIC
  Filled 2013-03-31: qty 2.5

## 2013-03-31 MED ORDER — LIDOCAINE-EPINEPHRINE 2 %-1:100000 IJ SOLN
INTRAMUSCULAR | Status: DC | PRN
  Administered 2013-03-31: 11:00:00 via RETROBULBAR

## 2013-03-31 MED ORDER — KETOROLAC TROMETHAMINE 0.5 % OP SOLN
1.0000 [drp] | OPHTHALMIC | Status: AC
  Administered 2013-03-31 (×2): 1 [drp] via OPHTHALMIC

## 2013-03-31 MED ORDER — TETRACAINE HCL 0.5 % OP SOLN
OPHTHALMIC | Status: AC
  Filled 2013-03-31: qty 2

## 2013-03-31 MED ORDER — BUPIVACAINE HCL (PF) 0.75 % IJ SOLN
INTRAMUSCULAR | Status: AC
  Filled 2013-03-31: qty 10

## 2013-03-31 SURGICAL SUPPLY — 37 items
APL SRG 3 HI ABS STRL LF PLS (MISCELLANEOUS) ×1
APPLICATOR COTTON TIP 6IN STRL (MISCELLANEOUS) ×2 IMPLANT
APPLICATOR DR MATTHEWS STRL (MISCELLANEOUS) ×2 IMPLANT
BLADE KERATOME 2.75 (BLADE) ×2 IMPLANT
CANNULA ANTERIOR CHAMBER 27GA (MISCELLANEOUS) ×2 IMPLANT
CLOTH BEACON ORANGE TIMEOUT ST (SAFETY) ×2 IMPLANT
DRAPE OPHTHALMIC 40X48 W POUCH (DRAPES) ×2 IMPLANT
DRAPE RETRACTOR (MISCELLANEOUS) ×2 IMPLANT
GLOVE BIO SURGEON STRL SZ8 (GLOVE) ×3 IMPLANT
GLOVE BIOGEL PI IND STRL 7.5 (GLOVE) IMPLANT
GLOVE BIOGEL PI INDICATOR 7.5 (GLOVE) ×1
GLOVE ECLIPSE 7.0 STRL STRAW (GLOVE) ×2 IMPLANT
GLOVE SURG SS PI 6.0 STRL IVOR (GLOVE) ×1 IMPLANT
GOWN STRL NON-REIN LRG LVL3 (GOWN DISPOSABLE) ×5 IMPLANT
KIT BASIN OR (CUSTOM PROCEDURE TRAY) ×2 IMPLANT
KIT ROOM TURNOVER OR (KITS) ×2 IMPLANT
LENS IOL ACRSF IQ PC 21.5 (Intraocular Lens) IMPLANT
LENS IOL ACRYSOF IQ POST 21.5 (Intraocular Lens) ×2 IMPLANT
MASK EYE SHIELD (GAUZE/BANDAGES/DRESSINGS) ×1 IMPLANT
NDL 18GX1X1/2 (RX/OR ONLY) (NEEDLE) ×1 IMPLANT
NDL 25GX 5/8IN NON SAFETY (NEEDLE) ×1 IMPLANT
NDL FILTER BLUNT 18X1 1/2 (NEEDLE) ×1 IMPLANT
NEEDLE 18GX1X1/2 (RX/OR ONLY) (NEEDLE) ×2 IMPLANT
NEEDLE 25GX 5/8IN NON SAFETY (NEEDLE) ×4 IMPLANT
NEEDLE FILTER BLUNT 18X 1/2SAF (NEEDLE) ×1
NEEDLE FILTER BLUNT 18X1 1/2 (NEEDLE) ×1 IMPLANT
NS IRRIG 1000ML POUR BTL (IV SOLUTION) ×2 IMPLANT
PACK CATARACT CUSTOM (CUSTOM PROCEDURE TRAY) ×2 IMPLANT
PACK CATARACT MCHSCP (PACKS) ×2 IMPLANT
PAD ARMBOARD 7.5X6 YLW CONV (MISCELLANEOUS) ×4 IMPLANT
PAD EYE OVAL STERILE LF (GAUZE/BANDAGES/DRESSINGS) ×1 IMPLANT
SPEAR EYE SURG WECK-CEL (MISCELLANEOUS) ×1 IMPLANT
SUT ETHILON 10 0 CS140 6 (SUTURE) ×2 IMPLANT
SYR 3ML LL SCALE MARK (SYRINGE) IMPLANT
SYR TB 1ML LUER SLIP (SYRINGE) ×2 IMPLANT
TOWEL OR 17X24 6PK STRL BLUE (TOWEL DISPOSABLE) ×4 IMPLANT
WATER STERILE IRR 1000ML POUR (IV SOLUTION) ×2 IMPLANT

## 2013-03-31 NOTE — Transfer of Care (Signed)
Immediate Anesthesia Transfer of Care Note  Patient: Marcus Downs  Procedure(s) Performed: Procedure(s): CATARACT EXTRACTION PHACO AND INTRAOCULAR LENS PLACEMENT (IOC) (Left)  Patient Location: Short Stay  Anesthesia Type:MAC  Level of Consciousness: awake, alert , oriented and patient cooperative  Airway & Oxygen Therapy: Patient Spontanous Breathing  Post-op Assessment: Report given to PACU RN  Post vital signs: Reviewed and stable  Complications: No apparent anesthesia complications

## 2013-03-31 NOTE — Anesthesia Postprocedure Evaluation (Signed)
  Anesthesia Post-op Note  Patient: Marcus Downs  Procedure(s) Performed: Procedure(s): CATARACT EXTRACTION PHACO AND INTRAOCULAR LENS PLACEMENT (IOC) (Left)  Patient Location: PACU  Anesthesia Type:MAC  Level of Consciousness: awake  Airway and Oxygen Therapy: Patient Spontanous Breathing  Post-op Pain: none  Post-op Assessment: Post-op Vital signs reviewed  Post-op Vital Signs: stable  Complications: No apparent anesthesia complications

## 2013-03-31 NOTE — Preoperative (Signed)
Beta Blockers   Reason not to administer Beta Blockers:Not Applicable 

## 2013-03-31 NOTE — H&P (Signed)
History & Physical:  DATE:  NAME: Marcus Downs, Marcus Downs [id number]  HISTORY OF PRESENT ILLNESS:  Chief Eye Complaints blurry vision "CAN'T READ OR SEE TV" & glaucoma WORSE OS  HPI: EYES: Reports symptoms of vision disturbances.  LOCATION:  QUALITY/COURSE: Reports condition is worsening.  INTENSITY/SEVERITY: SEVERE  DURATION: Reports the general length of symptoms to be weeks.  ONSET/TIMING: Reports occurrence as 3 months ago.  CONTEXT/WHEN: Reports usually associated with  MODIFIERS/TREATMENTS: Improved by  ROS: GEN- Constitutional:  HENT:  GEN - Endocrine: Reports symptoms of diabetes.  LUNGS/Respiratory:  HEART/Cardiovascular: Reports symptoms of hypertension.  ABD/Gastrointestinal:  Musculoskeletal (BJE):  NEURO/Neurological:  PSYCH/Psychiatric: Is the pt oriented to time, place, person?YES  Mood depressed __ normal agitated __  ACTIVE PROBLEMS:  Primary open angle glaucoma ICD#365.11 stable  Nuclear cataract NOS ICD#366.04 worse right eye  SURGERIES:  Pick List - Surgeries  Cerebrovascular Accident (CVA or stroke] ICD#436 1997  TOBACCO:  Pick List - Tobacco - Summary  SOCIAL HISTORY:  Starter Pick List - Social History  FAMILY HISTORY:  Positive family history for -  Negative family history for -  PARENTS:  CHILDREN:  GRANDPARENTS:  SIBLINGS:  UNCLES/AUNTS:  OTHERS/DISTANT:  ALLERGIES:  Starter - Allergies - Summary:  PHYSICAL EXAMINATION:   BEST CORRECTED Va OD Hawley 20/ cc 20/30  OS  20/ cc 20/50+  PUPILS: REACTIVE  EYELIDS & OCULAR ADNEXA normal with  SLE:  Conjunctiva quiet each eye  Cornea decreased tear film arcus each eye  anterior chamber deep and quiet each eye  Iris Brown each eye  Lens PC IOL OD, +2-3 nuclear sclerosis OS  Vitreous  CCT  Ta in mmHg 16 OD 15 OS  Time 12:20 PM  Gonio  Dilation tropicamide 1% phenylephrine 2.5%  Fundus:  optic nerve OD 70% cup pink  OS 70% cup pink  Macula clear OD clear OS  Vessels narrow arterioles no  hemorrhages no microaneurysms  Periphery normal  Exam: GENERAL: Appearance: General appearance can be described as well-nourished, well-developed, and in no acute distress.  LYMPHATIC:  HEAD, EARS, NOSE AND THROAT: Ears-Nose (external) Inspection: Externally, nose and ears are normal in appearance and without scars, lesions, or nodules.  Otoscopic Exam: External auditory canals and tympanic membranes are normal.  Hearing assessment shows no problems with normal conversation.  Nose exam, internally, reveals nasal mucosa, septum and turbinates are unremarkable.  Teeth, Gingiva, and Lip Exams: No lesions or evidence of infection.  Oropharynx demonstrates oral mucosa, salivary glands, tongue, tonsils, posterior pharynx, hard-soft palates are normal.  EYES:  see above  NECK: Neck tissue exam demonstrates no masses, symmetrical, and trachea is midline.  LUNGS and RESPIRATORY: Lung auscultation elicits no wheezing, rhonci, rales or rubs and with equal breath sounds.  Respiratory effort described as breathing is unlabored and chest movement is symmetrical.  HEART (Cardiovascular): Heart auscultation discovers regular rate and rhythm; no murmur, gallop or rub. Normal heart sounds.  ABDOMEN (Gastrointestinal): Mass/Tenderness Exam: Neither are present.  Liver/Spleen: No hepatomegaly or splenomegaly.  MUSCULOSKELETAL (BJE): Inspection-Palpation:  left paralysis  NEUROLOGICAL:  right CVA  PSYCHIATRIC: Insight and judgment appear both to be intact and appropriate.  Mood and affect are described as normal mood and full affect.  SKIN: Skin Inspection: No rashes or lesions.  H&P  B/P:134/66  Resp:20  Pulse:74  ADMITTING DIAGNOSIS:  Primary open angle glaucoma ICD#365.11 stable  Nuclear cataract NOS ICD#366.04 worse right eye  SURGICAL TREATMENT PLAN:  Phacoemulsification with intraocular lens implant LEFT eye the risks  and benefits of the surgery have been reviewed with the patient and he agrees to  proceed.  Actions:

## 2013-03-31 NOTE — Op Note (Signed)
Preoperative diagnosis: Visually significant cataract left eye Postoperative diagnosis: Same Procedure: Phacoemulsification with intraocular lens implant left eye Complications: None Surgeon: [name] Anesthesia: 2% Xylocaine with epinephrine a 50-50 mixture 0.75% Marcaine with ample Wydase Procedure: The patient was taken to the operating room where he was given a peribulbar block with the aforementioned local anesthetic agent. Following this the patient's face prepped and draped in the usual sterile fashion with a surgeon sitting temporally the operating microscope in position a Weck-Cel sponges used to fixate the globe and a 15 blade was used to enter through clear cornea at the 4:30 position following this Viscoat was injected into the eye. An additional Weck-Cel was used to fixate the globe and a 2.5 mm keratome blade was used in a stepwise fashion through temporal clear cornea to into the anterior chamber. Following this a bent 25-gauge needle was used to incise anterior capsule and a continuous tear curvilinear capsulorrhexis was formed the anterior capsule was removed with the Utrata forceps BSS was used to hydrodissect the nucleus and the nucleus was seen to prolapse partly into the anterior chamber this point the nucleus was a very thick very dense nucleus therefore phacoemulsification unit was used to sculpt the nucleus at the iris plane and then the nucleus was rotated and additional sculpting took place for 4 quadrants of sculpting that was very difficult because of the density of the nucleus after adequate sculpting had occurred the Kuglen hook and the phacotip were used to separate the nucleus into 4 quadrants. All 4 quadrants were removed the posterior capsule remained intact the I/A was then used to strip cortical fibrous and the posterior capsule small amount of subincision cortical material remained. Provisc was injected in the eye and intraocular lens implant was selected was noted to  have no defects the lens was an Alcon AcrySof SN 60 WF 21.5 diopter lens SN #[id number] the lens  placed in the ens injector and the lens was injected into the eye. The lens was positioned with a Kuglen hook, following this the I/A was used to remove subincisional cortex and viscoelastic from the eye Miostat was injected in the eye. The eye was pressurized and a single 10-0 nylon suture was placed there was no leakage noted therefore all instruments removed from the eye topical TobraDex ointm ent was applied to the eye a patch and Fox shield were placed and the patient returned to recovery area in stable condition

## 2013-03-31 NOTE — Anesthesia Preprocedure Evaluation (Addendum)
Anesthesia Evaluation  Patient identified by MRN, date of birth, ID band Patient awake    Reviewed: Allergy & Precautions, H&P , Patient's Chart, lab work & pertinent test results  History of Anesthesia Complications Negative for: history of anesthetic complications  Airway       Dental  (+) Edentulous Upper, Edentulous Lower and Dental Advisory Given   Pulmonary neg pulmonary ROS,  breath sounds clear to auscultation        Cardiovascular hypertension, + dysrhythmias Rhythm:Regular Rate:Normal     Neuro/Psych CVA, Residual Symptoms    GI/Hepatic GERD-  ,  Endo/Other  diabetes  Renal/GU Renal InsufficiencyRenal disease     Musculoskeletal   Abdominal   Peds  Hematology   Anesthesia Other Findings   Reproductive/Obstetrics                          Anesthesia Physical Anesthesia Plan  ASA: III  Anesthesia Plan: MAC   Post-op Pain Management:    Induction: Intravenous  Airway Management Planned: Natural Airway and Simple Face Mask  Additional Equipment:   Intra-op Plan:   Post-operative Plan:   Informed Consent: I have reviewed the patients History and Physical, chart, labs and discussed the procedure including the risks, benefits and alternatives for the proposed anesthesia with the patient or authorized representative who has indicated his/her understanding and acceptance.   Dental advisory given  Plan Discussed with: CRNA and Surgeon  Anesthesia Plan Comments:         Anesthesia Quick Evaluation

## 2013-03-31 NOTE — Progress Notes (Signed)
[id number]  We called Dr. Harlon Flor to let him know that there are no orders in Epic for eye gtts or surgical permit wording.Marland Kitchen...will periodicaly check .Marland KitchenMarland KitchenMarland KitchenDA

## 2013-04-01 ENCOUNTER — Other Ambulatory Visit: Payer: Medicare Other

## 2013-04-02 ENCOUNTER — Encounter (HOSPITAL_COMMUNITY): Payer: Self-pay | Admitting: Ophthalmology

## 2013-04-02 ENCOUNTER — Ambulatory Visit: Payer: Medicare Other | Admitting: Family Medicine

## 2013-07-27 ENCOUNTER — Ambulatory Visit: Payer: Medicare Other | Admitting: Family Medicine

## 2013-07-28 ENCOUNTER — Encounter: Payer: Self-pay | Admitting: Family Medicine

## 2013-07-28 ENCOUNTER — Ambulatory Visit (INDEPENDENT_AMBULATORY_CARE_PROVIDER_SITE_OTHER): Payer: Medicare Other | Admitting: Family Medicine

## 2013-07-28 VITALS — BP 156/60 | HR 96 | Temp 98.3°F | Wt 201.5 lb

## 2013-07-28 DIAGNOSIS — E119 Type 2 diabetes mellitus without complications: Secondary | ICD-10-CM | POA: Diagnosis not present

## 2013-07-28 DIAGNOSIS — I10 Essential (primary) hypertension: Secondary | ICD-10-CM | POA: Diagnosis not present

## 2013-07-28 DIAGNOSIS — Z23 Encounter for immunization: Secondary | ICD-10-CM

## 2013-07-28 NOTE — Assessment & Plan Note (Signed)
Chronic, stable. No changes today. Does not check sugars 2/2 hemiparesis. Goal A1c for him given comorbidities and risk of hypoglycemia is low 7s.

## 2013-07-28 NOTE — Patient Instructions (Addendum)
Flu shot today. No changes today. Blood work next visit, return in 4 months for fasting blood work and [employer] visit.

## 2013-07-28 NOTE — Addendum Note (Signed)
Addended by: Josph Macho A on: 07/28/2013 12:19 PM   Modules accepted: Orders

## 2013-07-28 NOTE — Progress Notes (Signed)
  Subjective:    Patient ID: Marcus Downs, male    DOB: 23-Feb-1941, 72 y.o.   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  Notes marked improvement in vision, not using glasses regularly, just readers.  HTN - bp elevated today.  Endorses nerves about being late.  No HA, vision changes, CP/tightness, SOB, leg swelling.   DM - doesn't check sugars regularly.  Denies hypoglycemic sxs.  Last eye exam was 06/2013.  Foot exam today.  Flu shot today. Lab Results  Component Value Date   HGBA1C 7.1* 03/23/2013   Past Medical History  Diagnosis Date  . Diabetes mellitus type II 1992  . HLD (hyperlipidemia) 10/1998  . HTN (hypertension) 10/1998  . 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 CRI (chronic renal insufficiency)   . RBBB   . Stroke     Left sided weakness    Review of Systems Per HPI    Objective:   Physical Exam  Nursing note and vitals reviewed. Constitutional: He appears well-developed and well-nourished. No distress.  HENT:  Head: Normocephalic and atraumatic.  Mouth/Throat: Oropharynx is clear and moist. No oropharyngeal exudate.  Neck: Normal range of motion. Neck supple.  Cardiovascular: Normal rate, regular rhythm, normal heart sounds and intact distal pulses.   No murmur heard. Pulmonary/Chest: Effort normal and breath sounds normal. No respiratory distress. He has no wheezes. He has no rales.  Musculoskeletal: He exhibits no edema.  LLE hemiparesis. Diabetic foot exam: Some skin breakdown bilaterally No calluses  Diminished DP/PT pulses Normal sensation to light touch and monofilament Nails thickened, onychomycotic  Lymphadenopathy:    He has no cervical adenopathy.  Skin: Skin is warm and dry. No rash noted.  Psychiatric: He has a normal mood and affect.        Assessment & Plan:

## 2013-07-28 NOTE — Assessment & Plan Note (Signed)
Chronic.  Elevated today, improved on recheck.  Consider change ARB to more potent (like diovan) vs addition of spironolactone or lasix. No changes today.

## 2013-08-12 ENCOUNTER — Other Ambulatory Visit: Payer: Self-pay | Admitting: Family Medicine

## 2013-08-17 DIAGNOSIS — H2510 Age-related nuclear cataract, unspecified eye: Secondary | ICD-10-CM | POA: Diagnosis not present

## 2013-08-17 DIAGNOSIS — H47239 Glaucomatous optic atrophy, unspecified eye: Secondary | ICD-10-CM | POA: Diagnosis not present

## 2013-08-17 DIAGNOSIS — H409 Unspecified glaucoma: Secondary | ICD-10-CM | POA: Diagnosis not present

## 2013-10-07 ENCOUNTER — Other Ambulatory Visit: Payer: Self-pay | Admitting: Family Medicine

## 2013-10-21 ENCOUNTER — Ambulatory Visit (INDEPENDENT_AMBULATORY_CARE_PROVIDER_SITE_OTHER): Payer: Medicare Other | Admitting: Family Medicine

## 2013-10-21 ENCOUNTER — Encounter: Payer: Self-pay | Admitting: Family Medicine

## 2013-10-21 VITALS — BP 136/78 | HR 99 | Temp 98.8°F

## 2013-10-21 DIAGNOSIS — R197 Diarrhea, unspecified: Secondary | ICD-10-CM | POA: Diagnosis not present

## 2013-10-21 LAB — HEMOCCULT GUIAC POC 1CARD (OFFICE)

## 2013-10-21 NOTE — Assessment & Plan Note (Signed)
Ongoing over the last month, but actually better over the last few days. Recommended we monitor sxs for now, if diarrhea recurs, I asked him to return for stool studies (culture and c diff) to r/o infection. Today exam benign. Hemoccult in office negative for blood.

## 2013-10-21 NOTE — Progress Notes (Signed)
   Subjective:    Patient ID: Marcus Downs, male    DOB: 1941/07/25, 72 y.o.   MRN: [id number]  HPI CC: "i've been feeling so bad"  Presents with oldest son today.  Ongoing issue with diarrhea for the last month.  More intermittently prior.  Has been self treating with immodium which helps.  Some watery diarrhea as well.  Has been using depens.  Diarrhea associated with urgency.  Increased gassiness/bloating.  Some nausea initially and vomited x1.   This week sxs actually improving.  Last stool was this morning, passing gas ok. No fevers/chills, abd pain. No sick contacts at home. No recent travel. Uses well water. No new restaurants.   Past Medical History  Diagnosis Date  . Diabetes mellitus type II 1992  . HLD (hyperlipidemia) 10/1998  . HTN (hypertension) 10/1998  . 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 CRI (chronic renal insufficiency)   . RBBB   . Stroke     Left sided weakness    Past Surgical History  Procedure Laterality Date  . Lipoma removal  1970's    Right shoulder  . Cataract extraction w/phaco  09/09/2012    Procedure: CATARACT EXTRACTION PHACO AND INTRAOCULAR LENS PLACEMENT (IOC);  Surgeon: [name], MD;  Location: [facility] OR;  Service: Ophthalmology;  Laterality: Right;  . US echocardiography  03/2013    mod LVH, EF 60%, normal wall motion  . Eye surgery    . Cataract extraction w/phaco Left 03/31/2013    Procedure: CATARACT EXTRACTION PHACO AND INTRAOCULAR LENS PLACEMENT (IOC);  Surgeon: [name], MD;  Location: [facility] OR;  Service: Ophthalmology;  Laterality: Left;   Review of Systems Per HPI     Objective:   Physical Exam  Nursing note and vitals reviewed. Constitutional: He appears well-developed and well-nourished. No distress.  Cardiovascular: Normal rate, regular rhythm, normal heart sounds and intact distal pulses.   No murmur heard. Pulmonary/Chest: Effort normal and breath sounds normal. No respiratory distress.  He has no wheezes. He has no rales.  Abdominal: Soft. Bowel sounds are normal. He exhibits no distension. There is no tenderness. There is no rebound and no guarding.  Genitourinary: Rectum normal. Rectal exam shows no external hemorrhoid, no internal hemorrhoid, no fissure, no mass, no tenderness and anal tone normal. Guaiac negative stool. Prostate is enlarged (25-30gm).       Assessment & Plan:

## 2013-10-21 NOTE — Progress Notes (Signed)
Pre-visit discussion using our clinic review tool. No additional management support is needed unless otherwise documented below in the visit note.  

## 2013-10-21 NOTE — Patient Instructions (Signed)
Stool test was negative for blood today. Let's watch this for now, immodium is ok to take but if diarrhea is persisting, I want you to return for further stool tests. Let me know how you are doing. Good to see you today, call us with questions.

## 2013-10-25 ENCOUNTER — Ambulatory Visit: Payer: Medicare Other | Admitting: Family Medicine

## 2013-10-30 ENCOUNTER — Emergency Department (HOSPITAL_COMMUNITY): Payer: Medicare Other

## 2013-10-30 ENCOUNTER — Encounter (HOSPITAL_COMMUNITY): Payer: Self-pay | Admitting: Emergency Medicine

## 2013-10-30 ENCOUNTER — Emergency Department (HOSPITAL_COMMUNITY)
Admission: EM | Admit: 2013-10-30 | Discharge: 2013-10-31 | Disposition: A | Discharge status: Home / Self Care | Payer: Medicare Other | Attending: Emergency Medicine | Admitting: Emergency Medicine

## 2013-10-30 DIAGNOSIS — N390 Urinary tract infection, site not specified: Secondary | ICD-10-CM | POA: Diagnosis not present

## 2013-10-30 DIAGNOSIS — R296 Repeated falls: Secondary | ICD-10-CM | POA: Insufficient documentation

## 2013-10-30 DIAGNOSIS — Z794 Long term (current) use of insulin: Secondary | ICD-10-CM | POA: Diagnosis not present

## 2013-10-30 DIAGNOSIS — Z87891 Personal history of nicotine dependence: Secondary | ICD-10-CM | POA: Insufficient documentation

## 2013-10-30 DIAGNOSIS — S298XXA Other specified injuries of thorax, initial encounter: Secondary | ICD-10-CM | POA: Diagnosis not present

## 2013-10-30 DIAGNOSIS — Z8673 Personal history of transient ischemic attack (TIA), and cerebral infarction without residual deficits: Secondary | ICD-10-CM | POA: Diagnosis not present

## 2013-10-30 DIAGNOSIS — Z79899 Other long term (current) drug therapy: Secondary | ICD-10-CM | POA: Insufficient documentation

## 2013-10-30 DIAGNOSIS — Z7982 Long term (current) use of aspirin: Secondary | ICD-10-CM | POA: Diagnosis not present

## 2013-10-30 DIAGNOSIS — N189 Chronic kidney disease, unspecified: Secondary | ICD-10-CM | POA: Insufficient documentation

## 2013-10-30 DIAGNOSIS — Y929 Unspecified place or not applicable: Secondary | ICD-10-CM | POA: Insufficient documentation

## 2013-10-30 DIAGNOSIS — S0990XA Unspecified injury of head, initial encounter: Secondary | ICD-10-CM | POA: Diagnosis not present

## 2013-10-30 DIAGNOSIS — E119 Type 2 diabetes mellitus without complications: Secondary | ICD-10-CM | POA: Diagnosis not present

## 2013-10-30 DIAGNOSIS — M6281 Muscle weakness (generalized): Secondary | ICD-10-CM | POA: Insufficient documentation

## 2013-10-30 DIAGNOSIS — Z792 Long term (current) use of antibiotics: Secondary | ICD-10-CM | POA: Insufficient documentation

## 2013-10-30 DIAGNOSIS — Y939 Activity, unspecified: Secondary | ICD-10-CM | POA: Insufficient documentation

## 2013-10-30 DIAGNOSIS — I129 Hypertensive chronic kidney disease with stage 1 through stage 4 chronic kidney disease, or unspecified chronic kidney disease: Secondary | ICD-10-CM | POA: Diagnosis not present

## 2013-10-30 DIAGNOSIS — Z9181 History of falling: Secondary | ICD-10-CM | POA: Diagnosis not present

## 2013-10-30 DIAGNOSIS — R9431 Abnormal electrocardiogram [ECG] [EKG]: Secondary | ICD-10-CM | POA: Diagnosis not present

## 2013-10-30 LAB — CBC WITH DIFFERENTIAL/PLATELET
Basophils Absolute: 0 10*3/uL (ref 0.0–0.1)
Basophils Relative: 0 % (ref 0–1)
HCT: 35.6 % — ABNORMAL LOW (ref 39.0–52.0)
Lymphocytes Relative: 14 % (ref 12–46)
MCHC: 36.5 g/dL — ABNORMAL HIGH (ref 30.0–36.0)
Neutro Abs: 6.2 10*3/uL (ref 1.7–7.7)
Neutrophils Relative %: 75 % (ref 43–77)
Platelets: 337 10*3/uL (ref 150–400)
RDW: 18.1 % — ABNORMAL HIGH (ref 11.5–15.5)
WBC: 8.2 10*3/uL (ref 4.0–10.5)

## 2013-10-30 LAB — COMPREHENSIVE METABOLIC PANEL
ALT: 21 U/L (ref 0–53)
AST: 40 U/L — ABNORMAL HIGH (ref 0–37)
Albumin: 2.9 g/dL — ABNORMAL LOW (ref 3.5–5.2)
CO2: 25 mEq/L (ref 19–32)
Chloride: 96 mEq/L (ref 96–112)
Creatinine, Ser: 1.23 mg/dL (ref 0.50–1.35)
GFR calc non Af Amer: 57 mL/min — ABNORMAL LOW (ref >90)
Potassium: 4.7 mEq/L (ref 3.5–5.1)
Sodium: 134 mEq/L — ABNORMAL LOW (ref 135–145)
Total Bilirubin: 0.5 mg/dL (ref 0.3–1.2)

## 2013-10-30 NOTE — ED Notes (Signed)
The pt lives alone and according to the family he has been falling and lying in the floor until someone comes and gest him up.  He has also had slurred speech for 2-3 days. He has had a stroke in the past years ago.  He has been incontinent of bowel and bladder .  No pain  Any where.  Family with him

## 2013-10-30 NOTE — ED Notes (Signed)
MD at bedside. 

## 2013-10-30 NOTE — ED Notes (Signed)
Pt taken to xray 

## 2013-10-31 LAB — URINALYSIS, ROUTINE W REFLEX MICROSCOPIC
Glucose, UA: >1000 mg/dL — AB
Ketones, ur: NEGATIVE mg/dL
Protein, ur: 100 mg/dL — AB
pH: 5.5 (ref 5.0–8.0)

## 2013-10-31 LAB — URINE MICROSCOPIC-ADD ON

## 2013-10-31 MED ORDER — CIPROFLOXACIN HCL 500 MG PO TABS: 500.0000 mg | ORAL_TABLET | Freq: Two times a day (BID) | ORAL | Status: DC

## 2013-10-31 MED ORDER — CIPROFLOXACIN HCL 500 MG PO TABS
500.0000 mg | ORAL_TABLET | Freq: Once | ORAL | Status: AC
  Administered 2013-10-31: 500 mg via ORAL
  Filled 2013-10-31: qty 1

## 2013-10-31 NOTE — ED Provider Notes (Signed)
CSN: [id number]     Arrival date & time 10/30/13  1931 History   First MD Initiated Contact with Patient 10/30/13 2025     Chief Complaint  Patient presents with  . Fall   (Consider location/radiation/quality/duration/timing/severity/associated sxs/prior Treatment) HPI Marcus Downs is a 72 y.o. male who presents to the emergency department after a series of several falls.  Patient currently lives alone and over the last month has been increasingly weak and has had several falls.  Sometimes patient has lied there for several hours before getting help.  Finally today, his children were concerned.  Patient reports that although he is falling he has no complaints.  He states, "I feel fine and want to go home."  No pain in head, neck, chest, abdomen, pelvis, or extremities.  No other symptoms.  Past Medical History  Diagnosis Date  . Diabetes mellitus type II 1992  . HLD (hyperlipidemia) 10/1998  . HTN (hypertension) 10/1998  . 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 CRI (chronic renal insufficiency)   . RBBB   . Stroke     Left sided weakness   Past Surgical History  Procedure Laterality Date  . Lipoma removal  1970's    Right shoulder  . Cataract extraction w/phaco  09/09/2012    Procedure: CATARACT EXTRACTION PHACO AND INTRAOCULAR LENS PLACEMENT (IOC);  Surgeon: [name], MD;  Location: [facility] OR;  Service: Ophthalmology;  Laterality: Right;  . US echocardiography  03/2013    mod LVH, EF 60%, normal wall motion  . Eye surgery    . Cataract extraction w/phaco Left 03/31/2013    Procedure: CATARACT EXTRACTION PHACO AND INTRAOCULAR LENS PLACEMENT (IOC);  Surgeon: [name], MD;  Location: [facility] OR;  Service: Ophthalmology;  Laterality: Left;   Family History  Problem Relation Age of Onset  . Heart attack Father 27  . Hypertension Sister     Multiple medical problems  . Stroke Neg Hx   . Cancer Neg Hx   . Diabetes Sister    History  Substance Use  Topics  . Smoking status: Former Smoker -- 1.00 packs/day for 30 years    Types: Cigarettes    Quit date: 09/07/1996  . Smokeless tobacco: Never Used     Comment: quit after CVA  . Alcohol Use: No    Review of Systems  Constitutional: Negative for fever and chills.  HENT: Negative for congestion and sore throat.   Respiratory: Negative for cough.   Gastrointestinal: Negative for nausea, vomiting, abdominal pain, diarrhea and constipation.  Endocrine: Negative for polyuria.  Genitourinary: Negative for dysuria and hematuria.  Musculoskeletal: Negative for neck pain.  Skin: Negative for rash.  Neurological: Negative for headaches.  Psychiatric/Behavioral: Negative.   All other systems reviewed and are negative.    Allergies  Tape  Home Medications   Current Outpatient Rx  Name  Route  Sig  Dispense  Refill  . amLODipine-atorvastatin (CADUET) 10-80 MG per tablet   Oral   Take 1 tablet by mouth daily.         Marland Kitchen aspirin 325 MG EC tablet   Oral   Take 325 mg by mouth daily. For stroke prevention           . diltiazem (CARDIZEM CD) 360 MG 24 hr capsule   Oral   Take 1 capsule (360 mg total) by mouth daily.   31 capsule   11   . insulin aspart protamine-insulin aspart (  NOVOLOG 70/30) (70-30) 100 UNIT/ML injection   Subcutaneous   Inject 25-30 Units into the skin 2 (two) times daily with a meal. 30 units in AM; 25 Units in PM         . latanoprost (XALATAN) 0.005 % ophthalmic solution   Both Eyes   Place 1 drop into both eyes at bedtime.         Marland Kitchen losartan-hydrochlorothiazide (HYZAAR) 100-25 MG per tablet   Oral   Take 1 tablet by mouth daily.         . metFORMIN (GLUCOPHAGE) 850 MG tablet   Oral   Take 850 mg by mouth 2 (two) times daily with a meal.         . potassium chloride (K-DUR) 10 MEQ tablet   Oral   Take 20 mEq by mouth 2 (two) times daily.          . ciprofloxacin (CIPRO) 500 MG tablet   Oral   Take 1 tablet (500 mg total) by mouth  2 (two) times daily. One po bid x 7 days   14 tablet   0    BP 128/64  Pulse 85  Temp(Src) 97.9 F (36.6 C) (Oral)  Resp 22  Ht 5\' 11"  (1.803 m)  Wt 186 lb 14.4 oz (84.777 kg)  BMI 26.08 kg/m2  SpO2 99% Physical Exam  Nursing note and vitals reviewed. Constitutional: He is oriented to person, place, and time. He appears well-developed and well-nourished. No distress.  HENT:  Head: Normocephalic and atraumatic.  Right Ear: External ear normal.  Left Ear: External ear normal.  Mouth/Throat: Oropharynx is clear and moist. No oropharyngeal exudate.  Eyes: Conjunctivae are normal. Pupils are equal, round, and reactive to light. Right eye exhibits no discharge.  Neck: Normal range of motion. Neck supple. No tracheal deviation present.  Cardiovascular: Normal rate, regular rhythm and intact distal pulses.   Pulmonary/Chest: Effort normal. No respiratory distress. He has no wheezes. He has no rales.  Abdominal: Soft. He exhibits no distension. There is no tenderness. There is no rebound and no guarding.  Musculoskeletal: Normal range of motion.  Neurological: He is alert and oriented to person, place, and time. He has normal strength and normal reflexes. No cranial nerve deficit or sensory deficit. He displays a negative Romberg sign. Coordination and gait normal. GCS eye subscore is 4. GCS verbal subscore is 5. GCS motor subscore is 6.  Skin: Skin is warm and dry. No rash noted. He is not diaphoretic.  Psychiatric: He has a normal mood and affect.    ED Course  Procedures (including critical care time) Labs Review Labs Reviewed  CBC WITH DIFFERENTIAL - Abnormal; Notable for the following:    HCT 35.6 (*)    MCV 75.4 (*)    MCHC 36.5 (*)    RDW 18.1 (*)    All other components within normal limits  COMPREHENSIVE METABOLIC PANEL - Abnormal; Notable for the following:    Sodium 134 (*)    Glucose, Bld 372 (*)    Albumin 2.9 (*)    AST 40 (*)    GFR calc non Af Amer 57 (*)     GFR calc Af Amer 66 (*)    All other components within normal limits  URINALYSIS, ROUTINE W REFLEX MICROSCOPIC - Abnormal; Notable for the following:    APPearance CLOUDY (*)    Glucose, UA >1000 (*)    Hgb urine dipstick TRACE (*)    Bilirubin Urine SMALL (*)  Protein, ur 100 (*)    Leukocytes, UA MODERATE (*)    All other components within normal limits  URINE MICROSCOPIC-ADD ON - Abnormal; Notable for the following:    Squamous Epithelial / LPF FEW (*)    Bacteria, UA MANY (*)    Casts HYALINE CASTS (*)    All other components within normal limits  URINE CULTURE  TROPONIN I   Imaging Review Dg Chest 2 View  10/30/2013   CLINICAL DATA:  Fall.  Diabetes.  EXAM: CHEST  2 VIEW  COMPARISON:  09/07/2012  FINDINGS: The heart size and mediastinal contours are within normal limits. Both lungs are clear. The visualized skeletal structures are unremarkable.  IMPRESSION: No active cardiopulmonary disease.   Electronically Signed   By: Myles Rosenthal M.D.   On: 10/30/2013 22:10   Ct Head Wo Contrast  10/30/2013   CLINICAL DATA:  Frequent falls.  Chronic left-sided weakness.  EXAM: CT HEAD WITHOUT CONTRAST  TECHNIQUE: Contiguous axial images were obtained from the base of the skull through the vertex without contrast.  COMPARISON:  None.  FINDINGS: Large remote right hemisphere infarct affects the temporal lobe and insular region along with the basal ganglia, all on the right. Hypodensity of the regional deep white matter extends into the frontal and parietal lobes. Atrophic change of the right brainstem reflects wallerian degeneration.  No acute stroke or hemorrhage. No mass lesion or hydrocephalus. No extra-axial fluid. Calvarium intact. Vascular calcification. No sinus or mastoid disease.  By report, this infarct was described in February of 2000.  IMPRESSION: No acute intracranial abnormality. Large remote right hemisphere insult by report is stable from 2000.   Electronically Signed   By: Davonna Belling M.D.   On: 10/30/2013 22:05    EKG Interpretation    Date/Time:  Saturday October 30 2013 19:58:20 EST Ventricular Rate:  95 PR Interval:  142 QRS Duration: 128 QT Interval:  374 QTC Calculation: 469 R Axis:   -12 Text Interpretation:  Normal sinus rhythm Right bundle branch block Abnormal ECG No significant change since last tracing Confirmed by POLLINA  MD, CHRISTOPHER ([id number]) on 10/31/2013 1:09:06 AM            MDM   1. Urinary tract infection    Marcus Downs is a 72 y.o. male who presents to the ED after several falls over last month.  No external signs of trauma on exam.  Basic workup initiated.  UA with evidence of infection.  No evidence of prostatitis.  Will treat UTI.  Spoke at length with patient and patient would like no hospital admission.  Patient desiring home and would not like any SNF or ALF.  Encouraged patient to use walker at home and to strongly consider in house help or ALF.  Family updated on plan.  They will f/u with PCP in next few days.  All questions answered.  Patient discharged.    Arloa Koh, MD 11/02/13 [phone]

## 2013-10-31 NOTE — ED Notes (Signed)
Unable to reach social worker

## 2013-11-02 LAB — URINE CULTURE

## 2013-11-03 NOTE — ED Provider Notes (Signed)
I saw and evaluated the patient, reviewed the resident's note and I agree with the findings and plan.  EKG Interpretation    Date/Time:  Saturday October 30 2013 19:58:20 EST Ventricular Rate:  95 PR Interval:  142 QRS Duration: 128 QT Interval:  374 QTC Calculation: 469 R Axis:   -12 Text Interpretation:  Normal sinus rhythm Right bundle branch block Abnormal ECG No significant change since last tracing Confirmed by Constantin Hillery  MD, Shefali Ng ([id number]) on 10/31/2013 1:09:06 AM            Patient with generalized weakness and several falls without any significant injury. Patient evaluated and found to have urinary tract infection which is felt to be the cause of the patient's weakness. Treat with antibiotics, followup with primary care.   Gilda Crease, MD 11/03/13 [phone]

## 2013-11-08 ENCOUNTER — Encounter: Payer: Self-pay | Admitting: Internal Medicine

## 2013-11-08 ENCOUNTER — Ambulatory Visit (INDEPENDENT_AMBULATORY_CARE_PROVIDER_SITE_OTHER): Payer: Medicare Other | Admitting: Family Medicine

## 2013-11-08 ENCOUNTER — Encounter: Payer: Self-pay | Admitting: Family Medicine

## 2013-11-08 VITALS — BP 152/62 | HR 97 | Temp 97.9°F | Wt 183.8 lb

## 2013-11-08 DIAGNOSIS — W19XXXD Unspecified fall, subsequent encounter: Secondary | ICD-10-CM

## 2013-11-08 DIAGNOSIS — I10 Essential (primary) hypertension: Secondary | ICD-10-CM

## 2013-11-08 DIAGNOSIS — N259 Disorder resulting from impaired renal tubular function, unspecified: Secondary | ICD-10-CM | POA: Diagnosis not present

## 2013-11-08 DIAGNOSIS — R634 Abnormal weight loss: Secondary | ICD-10-CM

## 2013-11-08 DIAGNOSIS — E46 Unspecified protein-calorie malnutrition: Secondary | ICD-10-CM | POA: Insufficient documentation

## 2013-11-08 DIAGNOSIS — I69959 Hemiplegia and hemiparesis following unspecified cerebrovascular disease affecting unspecified side: Secondary | ICD-10-CM

## 2013-11-08 DIAGNOSIS — R718 Other abnormality of red blood cells: Secondary | ICD-10-CM

## 2013-11-08 DIAGNOSIS — E119 Type 2 diabetes mellitus without complications: Secondary | ICD-10-CM | POA: Diagnosis not present

## 2013-11-08 DIAGNOSIS — R197 Diarrhea, unspecified: Secondary | ICD-10-CM

## 2013-11-08 DIAGNOSIS — W19XXXA Unspecified fall, initial encounter: Secondary | ICD-10-CM

## 2013-11-08 DIAGNOSIS — R296 Repeated falls: Secondary | ICD-10-CM | POA: Insufficient documentation

## 2013-11-08 DIAGNOSIS — E43 Unspecified severe protein-calorie malnutrition: Secondary | ICD-10-CM | POA: Insufficient documentation

## 2013-11-08 LAB — CBC WITH DIFFERENTIAL/PLATELET
Basophils Relative: 0.2 % (ref 0.0–3.0)
Eosinophils Absolute: 0.1 10*3/uL (ref 0.0–0.7)
Hemoglobin: 13.1 g/dL (ref 13.0–17.0)
Lymphs Abs: 1.2 10*3/uL (ref 0.7–4.0)
MCHC: 32.2 g/dL (ref 30.0–36.0)
MCV: 80.3 fl (ref 78.0–100.0)
Monocytes Absolute: 0.5 10*3/uL (ref 0.1–1.0)
Neutro Abs: 6.2 10*3/uL (ref 1.4–7.7)
Neutrophils Relative %: 77.9 % — ABNORMAL HIGH (ref 43.0–77.0)
RBC: 5.06 Mil/uL (ref 4.22–5.81)

## 2013-11-08 LAB — COMPREHENSIVE METABOLIC PANEL
ALT: 16 U/L (ref 0–53)
BUN: 14 mg/dL (ref 6–23)
CO2: 28 mEq/L (ref 19–32)
Calcium: 9 mg/dL (ref 8.4–10.5)
Chloride: 97 mEq/L (ref 96–112)
Creatinine, Ser: 1 mg/dL (ref 0.4–1.5)
GFR: 94.52 mL/min (ref >60.00)
Sodium: 136 mEq/L (ref 135–145)
Total Protein: 8.1 g/dL (ref 6.0–8.3)

## 2013-11-08 LAB — IBC PANEL
Iron: 34 ug/dL — ABNORMAL LOW (ref 42–165)
Transferrin: 156.2 mg/dL — ABNORMAL LOW (ref 212.0–360.0)

## 2013-11-08 MED ORDER — DICLOFENAC SODIUM 1 % TD GEL: 1.0000 "application " | Freq: Three times a day (TID) | TRANSDERMAL | Status: DC | PRN

## 2013-11-08 NOTE — Progress Notes (Signed)
Subjective:    Patient ID: Marcus Downs, male    DOB: 04-Dec-1940, 72 y.o.   MRN: [id number]  HPI CC: ER f/u  Very pleasant 72yo with h/o controlled T2DM, HTN, HLD, and CVA 1997 s/p residual L sided hemiparesis who presents today for ER follow up.  Presents with youngest son [name] today.  Briscoe was seen at ER 10/30/2013 with generalized weakness and several falls in days preceding presentation.  Workup revealed UTI (>100k Ecoli sensitive to cipro) and it was thought to be causative - he was placed on ciprofloxacin 500mg  twice daily to complete 7 d course.  He declined facility with higher level of care.  Denies premonitory sxs such as dizziness, lightheadedness or blurred vision, syncope, chest pain or dyspnea. Pt didn't have any urinary symptoms such as dysuria, urgency or frequency.   Noticing increasing saliva.  Decreased appetite.  Has started ensure supplement when he doesn't eat. He's had one fall this week since ER visit.  They would also like to discuss left axillary lump pt found recently.   Lab Results  Component Value Date   HGBA1C 7.1* 03/23/2013     Wt Readings from Last 3 Encounters:  11/08/13 183 lb 12 oz (83.348 kg)  10/30/13 186 lb 14.4 oz (84.777 kg)  07/28/13 201 lb 8 oz (91.4 kg)    Imaging Review  Dg Chest 2 View  10/30/2013 CLINICAL DATA: Fall. Diabetes. EXAM: CHEST 2 VIEW COMPARISON: 09/07/2012 FINDINGS: The heart size and mediastinal contours are within normal limits. Both lungs are clear. The visualized skeletal structures are unremarkable. IMPRESSION: No active cardiopulmonary disease. Electronically Signed By: Myles Rosenthal M.D. On: 10/30/2013 22:10  Ct Head Wo Contrast  10/30/2013 CLINICAL DATA: Frequent falls. Chronic left-sided weakness. EXAM: CT HEAD WITHOUT CONTRAST TECHNIQUE: Contiguous axial images were obtained from the base of the skull through the vertex without contrast. COMPARISON: None. FINDINGS: Large remote right hemisphere infarct affects the  temporal lobe and insular region along with the basal ganglia, all on the right. Hypodensity of the regional deep white matter extends into the frontal and parietal lobes. Atrophic change of the right brainstem reflects wallerian degeneration. No acute stroke or hemorrhage. No mass lesion or hydrocephalus. No extra-axial fluid. Calvarium intact. Vascular calcification. No sinus or mastoid disease. By report, this infarct was described in February of 2000. IMPRESSION: No acute intracranial abnormality. Large remote right hemisphere insult by report is stable from 2000. Electronically Signed By: Davonna Belling M.D. On: 10/30/2013 22:05    Current Outpatient Prescriptions on File Prior to Visit  Medication Sig Dispense Refill  . amLODipine-atorvastatin (CADUET) 10-80 MG per tablet Take 1 tablet by mouth daily.      Marland Kitchen aspirin 325 MG EC tablet Take 325 mg by mouth daily. For stroke prevention        . diltiazem (CARDIZEM CD) 360 MG 24 hr capsule Take 1 capsule (360 mg total) by mouth daily.  31 capsule  11  . insulin aspart protamine-insulin aspart (NOVOLOG 70/30) (70-30) 100 UNIT/ML injection Inject 25-30 Units into the skin 2 (two) times daily with a meal. 30 units in AM; 25 Units in PM      . latanoprost (XALATAN) 0.005 % ophthalmic solution Place 1 drop into both eyes at bedtime.      Marland Kitchen losartan-hydrochlorothiazide (HYZAAR) 100-25 MG per tablet Take 1 tablet by mouth daily.      . metFORMIN (GLUCOPHAGE) 850 MG tablet Take 850 mg by mouth 2 (two) times daily with a  meal.      . potassium chloride (K-DUR) 10 MEQ tablet Take 20 mEq by mouth 2 (two) times daily.        No current facility-administered medications on file prior to visit.   Past Medical History  Diagnosis Date  . Diabetes mellitus type II 1992  . HLD (hyperlipidemia) 10/1998  . HTN (hypertension) 10/1998  . 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 CRI (chronic renal insufficiency)   . RBBB   . Stroke      Left sided weakness    Past Surgical History  Procedure Laterality Date  . Lipoma removal  1970's    Right shoulder  . Cataract extraction w/phaco  09/09/2012    Procedure: CATARACT EXTRACTION PHACO AND INTRAOCULAR LENS PLACEMENT (IOC);  Surgeon: [name], MD;  Location: [facility] OR;  Service: Ophthalmology;  Laterality: Right;  . US echocardiography  03/2013    mod LVH, EF 60%, normal wall motion  . Eye surgery    . Cataract extraction w/phaco Left 03/31/2013    Procedure: CATARACT EXTRACTION PHACO AND INTRAOCULAR LENS PLACEMENT (IOC);  Surgeon: [name], MD;  Location: [facility] OR;  Service: Ophthalmology;  Laterality: Left;    Review of Systems Per HPI    Objective:   Physical Exam  Nursing note and vitals reviewed. Constitutional: He appears well-developed and well-nourished. No distress.  HENT:  Mouth/Throat: Oropharynx is clear and moist. No oropharyngeal exudate.  Eyes: Conjunctivae and EOM are normal. Pupils are equal, round, and reactive to light. No scleral icterus.  Cardiovascular: Normal rate, regular rhythm, normal heart sounds and intact distal pulses.   No murmur heard. Pulmonary/Chest: Effort normal and breath sounds normal. No respiratory distress. He has no wheezes. He has no rales.  Musculoskeletal: He exhibits no edema.  Lymphadenopathy:    He has no axillary adenopathy.       Left axillary: No lateral adenopathy present. Neurological:  Residual L hemiparesis       Assessment & Plan:

## 2013-11-08 NOTE — Assessment & Plan Note (Signed)
A1c today.  

## 2013-11-08 NOTE — Assessment & Plan Note (Signed)
L hemiparesis predisposes to falls. Will refer for [facility] PT program for home safety eval and falls prevention program. Homebound status 2/2 h/o CVA with L residual hemiparesis.

## 2013-11-08 NOTE — Patient Instructions (Signed)
Look into glucerna for supplements. I'd like to have you see one of the GI doctors to discuss colonoscopy. Blood work Sales executive by [facility] office to set up referrals to GI doctor and home health.

## 2013-11-08 NOTE — Assessment & Plan Note (Signed)
Ongoing over last 1+ month.  Along with loss of appetite, microcytosis, and weight loss - check iron panel today along with CMP, CBC. Hemoccult in office last visit negative, but pt never returned iFOB - difficulty collecting at home 2/2 hemiparesis and 2/2 diarrhea.  Discussed options - pt agrees to GI eval and possible colonoscopy so will refer today.

## 2013-11-08 NOTE — Progress Notes (Signed)
Pre-visit discussion using our clinic review tool. No additional management support is needed unless otherwise documented below in the visit note.  

## 2013-11-08 NOTE — Assessment & Plan Note (Signed)
Low albumin - recommended start glucerna supplements.

## 2013-11-10 ENCOUNTER — Telehealth: Payer: Self-pay

## 2013-11-10 MED ORDER — CIPROFLOXACIN HCL 500 MG PO TABS: 500.0000 mg | ORAL_TABLET | Freq: Two times a day (BID) | ORAL | Status: DC

## 2013-11-10 NOTE — Telephone Encounter (Signed)
Discussed with son.  Pt returned to baseline, son wonders about prostate infection given lower back pain and episode of nausea/vomiting.  Never urinary sxs.  Sxs did improve while pt was on abx, now recurring. Will extend antibiotic course for 2 more weeks. Advised if new sxs to seek urgent care or care here.

## 2013-11-10 NOTE — Telephone Encounter (Signed)
Marcus Downs pt's son said pt seems to have more slurred speech this AM; no appetite and pt is not eating but pt is drinking ensure. Pt constantly drolling. Pt has fallen x 1 since seen 11/08/13, no apparent injury. Pt not having burning, pain or frequency of urine but is having back pain. Pt still having diarrhea and has appt with GI doctor on 12/10/13 but Marcus Downs does not want to wait until 12/10/13 and wants sooner appt for GI. Marcus Downs thinks pt may need admission to hospital. [facility] has not contacted family about PT in home.Please advise.

## 2013-11-10 NOTE — Telephone Encounter (Signed)
When I went back to the call [name] wife was on phone and she said pt definitely needed admission. I advised per Dr Reece Agar recommendation that could do stool kit but if pt's slurred speech has worsened then pt needs to go to ED for evaluation. [name] wife said will take pt to [facility] ED for eval.

## 2013-11-12 ENCOUNTER — Other Ambulatory Visit: Payer: Self-pay | Admitting: Family Medicine

## 2013-11-18 DIAGNOSIS — I10 Essential (primary) hypertension: Secondary | ICD-10-CM | POA: Diagnosis not present

## 2013-11-18 DIAGNOSIS — E119 Type 2 diabetes mellitus without complications: Secondary | ICD-10-CM | POA: Diagnosis not present

## 2013-11-18 DIAGNOSIS — Z794 Long term (current) use of insulin: Secondary | ICD-10-CM | POA: Diagnosis not present

## 2013-11-18 DIAGNOSIS — Z9181 History of falling: Secondary | ICD-10-CM | POA: Diagnosis not present

## 2013-11-18 DIAGNOSIS — A498 Other bacterial infections of unspecified site: Secondary | ICD-10-CM | POA: Diagnosis not present

## 2013-11-18 DIAGNOSIS — N390 Urinary tract infection, site not specified: Secondary | ICD-10-CM | POA: Diagnosis not present

## 2013-11-18 DIAGNOSIS — IMO0001 Reserved for inherently not codable concepts without codable children: Secondary | ICD-10-CM | POA: Diagnosis not present

## 2013-11-18 DIAGNOSIS — I69959 Hemiplegia and hemiparesis following unspecified cerebrovascular disease affecting unspecified side: Secondary | ICD-10-CM | POA: Diagnosis not present

## 2013-11-18 DIAGNOSIS — E46 Unspecified protein-calorie malnutrition: Secondary | ICD-10-CM | POA: Diagnosis not present

## 2013-11-22 DIAGNOSIS — I69959 Hemiplegia and hemiparesis following unspecified cerebrovascular disease affecting unspecified side: Secondary | ICD-10-CM | POA: Diagnosis not present

## 2013-11-22 DIAGNOSIS — N390 Urinary tract infection, site not specified: Secondary | ICD-10-CM | POA: Diagnosis not present

## 2013-11-22 DIAGNOSIS — IMO0001 Reserved for inherently not codable concepts without codable children: Secondary | ICD-10-CM | POA: Diagnosis not present

## 2013-11-22 DIAGNOSIS — E46 Unspecified protein-calorie malnutrition: Secondary | ICD-10-CM | POA: Diagnosis not present

## 2013-11-22 DIAGNOSIS — E119 Type 2 diabetes mellitus without complications: Secondary | ICD-10-CM | POA: Diagnosis not present

## 2013-11-22 DIAGNOSIS — A498 Other bacterial infections of unspecified site: Secondary | ICD-10-CM | POA: Diagnosis not present

## 2013-11-25 DIAGNOSIS — N390 Urinary tract infection, site not specified: Secondary | ICD-10-CM | POA: Diagnosis not present

## 2013-11-25 DIAGNOSIS — E119 Type 2 diabetes mellitus without complications: Secondary | ICD-10-CM | POA: Diagnosis not present

## 2013-11-25 DIAGNOSIS — E46 Unspecified protein-calorie malnutrition: Secondary | ICD-10-CM | POA: Diagnosis not present

## 2013-11-25 DIAGNOSIS — I69959 Hemiplegia and hemiparesis following unspecified cerebrovascular disease affecting unspecified side: Secondary | ICD-10-CM | POA: Diagnosis not present

## 2013-11-25 DIAGNOSIS — A498 Other bacterial infections of unspecified site: Secondary | ICD-10-CM | POA: Diagnosis not present

## 2013-11-25 DIAGNOSIS — IMO0001 Reserved for inherently not codable concepts without codable children: Secondary | ICD-10-CM | POA: Diagnosis not present

## 2013-11-27 DIAGNOSIS — N390 Urinary tract infection, site not specified: Secondary | ICD-10-CM

## 2013-11-27 DIAGNOSIS — E119 Type 2 diabetes mellitus without complications: Secondary | ICD-10-CM

## 2013-11-27 DIAGNOSIS — IMO0001 Reserved for inherently not codable concepts without codable children: Secondary | ICD-10-CM

## 2013-11-27 DIAGNOSIS — A498 Other bacterial infections of unspecified site: Secondary | ICD-10-CM | POA: Diagnosis not present

## 2013-11-30 DIAGNOSIS — IMO0001 Reserved for inherently not codable concepts without codable children: Secondary | ICD-10-CM | POA: Diagnosis not present

## 2013-11-30 DIAGNOSIS — A498 Other bacterial infections of unspecified site: Secondary | ICD-10-CM | POA: Diagnosis not present

## 2013-11-30 DIAGNOSIS — E119 Type 2 diabetes mellitus without complications: Secondary | ICD-10-CM | POA: Diagnosis not present

## 2013-11-30 DIAGNOSIS — E46 Unspecified protein-calorie malnutrition: Secondary | ICD-10-CM | POA: Diagnosis not present

## 2013-11-30 DIAGNOSIS — N390 Urinary tract infection, site not specified: Secondary | ICD-10-CM | POA: Diagnosis not present

## 2013-11-30 DIAGNOSIS — I69959 Hemiplegia and hemiparesis following unspecified cerebrovascular disease affecting unspecified side: Secondary | ICD-10-CM | POA: Diagnosis not present

## 2013-12-02 DIAGNOSIS — N390 Urinary tract infection, site not specified: Secondary | ICD-10-CM | POA: Diagnosis not present

## 2013-12-02 DIAGNOSIS — A498 Other bacterial infections of unspecified site: Secondary | ICD-10-CM | POA: Diagnosis not present

## 2013-12-02 DIAGNOSIS — I69959 Hemiplegia and hemiparesis following unspecified cerebrovascular disease affecting unspecified side: Secondary | ICD-10-CM | POA: Diagnosis not present

## 2013-12-02 DIAGNOSIS — E46 Unspecified protein-calorie malnutrition: Secondary | ICD-10-CM | POA: Diagnosis not present

## 2013-12-02 DIAGNOSIS — IMO0001 Reserved for inherently not codable concepts without codable children: Secondary | ICD-10-CM | POA: Diagnosis not present

## 2013-12-02 DIAGNOSIS — E119 Type 2 diabetes mellitus without complications: Secondary | ICD-10-CM | POA: Diagnosis not present

## 2013-12-07 ENCOUNTER — Ambulatory Visit: Payer: Medicare Other | Admitting: Family Medicine

## 2013-12-07 DIAGNOSIS — IMO0001 Reserved for inherently not codable concepts without codable children: Secondary | ICD-10-CM | POA: Diagnosis not present

## 2013-12-07 DIAGNOSIS — A498 Other bacterial infections of unspecified site: Secondary | ICD-10-CM | POA: Diagnosis not present

## 2013-12-07 DIAGNOSIS — N390 Urinary tract infection, site not specified: Secondary | ICD-10-CM | POA: Diagnosis not present

## 2013-12-07 DIAGNOSIS — I69959 Hemiplegia and hemiparesis following unspecified cerebrovascular disease affecting unspecified side: Secondary | ICD-10-CM | POA: Diagnosis not present

## 2013-12-07 DIAGNOSIS — E46 Unspecified protein-calorie malnutrition: Secondary | ICD-10-CM | POA: Diagnosis not present

## 2013-12-07 DIAGNOSIS — E119 Type 2 diabetes mellitus without complications: Secondary | ICD-10-CM | POA: Diagnosis not present

## 2013-12-09 DIAGNOSIS — N390 Urinary tract infection, site not specified: Secondary | ICD-10-CM | POA: Diagnosis not present

## 2013-12-09 DIAGNOSIS — I69959 Hemiplegia and hemiparesis following unspecified cerebrovascular disease affecting unspecified side: Secondary | ICD-10-CM | POA: Diagnosis not present

## 2013-12-09 DIAGNOSIS — E46 Unspecified protein-calorie malnutrition: Secondary | ICD-10-CM | POA: Diagnosis not present

## 2013-12-09 DIAGNOSIS — E119 Type 2 diabetes mellitus without complications: Secondary | ICD-10-CM | POA: Diagnosis not present

## 2013-12-09 DIAGNOSIS — A498 Other bacterial infections of unspecified site: Secondary | ICD-10-CM | POA: Diagnosis not present

## 2013-12-09 DIAGNOSIS — IMO0001 Reserved for inherently not codable concepts without codable children: Secondary | ICD-10-CM | POA: Diagnosis not present

## 2013-12-10 ENCOUNTER — Encounter: Payer: Self-pay | Admitting: Internal Medicine

## 2013-12-10 ENCOUNTER — Ambulatory Visit (INDEPENDENT_AMBULATORY_CARE_PROVIDER_SITE_OTHER): Payer: Medicare Other | Admitting: Internal Medicine

## 2013-12-10 VITALS — BP 128/60 | HR 82 | Ht 71.0 in | Wt 180.0 lb

## 2013-12-10 DIAGNOSIS — R197 Diarrhea, unspecified: Secondary | ICD-10-CM | POA: Diagnosis not present

## 2013-12-10 DIAGNOSIS — Z1211 Encounter for screening for malignant neoplasm of colon: Secondary | ICD-10-CM

## 2013-12-10 DIAGNOSIS — R634 Abnormal weight loss: Secondary | ICD-10-CM | POA: Diagnosis not present

## 2013-12-10 NOTE — Patient Instructions (Signed)
You will be contacted by [employer] [facility] to schedule the colon cancer screening procedure with you.

## 2013-12-10 NOTE — Progress Notes (Signed)
HISTORY OF PRESENT ILLNESS:  Marcus Downs is a 73 y.o. male with multiple significant medical problems as listed below. Limited functional status due to prior stroke. Sent today by his PCP regarding chronic diarrhea, microcytosis, weight loss, and consideration of colonoscopy. The patient is accompanied by his son. He was seen in his primary providers office one month ago. At that time he was having difficulties with falls. Also reported a one-month history of diarrhea. This after having received ciprofloxacin for UTI. This appointment made. The patient tells me that he took 2 doses of Imodium and his diarrhea resolved without recurrence. He was also having problems with nausea and vomiting. This too has resolved. Review of outside blood work shows normal hemoglobin of 13.1. Normal MCV of 80.3. Normal ferritin 150.9. Outside Hemoccult stool reported as negative. He denies prior history of GI problems or GI evaluations. Weight is estimated to be done about 20 pounds. Significant decrease in appetite. Functional status has declined. He now have in-home healthcare 3 times a week for 3 hours a day  REVIEW OF SYSTEMS:  All non-GI ROS negative except for back pain, left hemiparesthesia, urinary frequency  Past Medical History  Diagnosis Date  . Diabetes mellitus type II 1992  . HLD (hyperlipidemia) 10/1998  . HTN (hypertension) 10/1998  . 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 CRI (chronic renal insufficiency)   . RBBB   . Stroke     Left sided weakness    Past Surgical History  Procedure Laterality Date  . Lipoma removal  1970's    Right shoulder  . Cataract extraction w/phaco  09/09/2012    Procedure: CATARACT EXTRACTION PHACO AND INTRAOCULAR LENS PLACEMENT (IOC);  Surgeon: [name], MD;  Location: [facility] OR;  Service: Ophthalmology;  Laterality: Right;  . US echocardiography  03/2013    mod LVH, EF 60%, normal wall motion  . Eye surgery    . Cataract  extraction w/phaco Left 03/31/2013    Procedure: CATARACT EXTRACTION PHACO AND INTRAOCULAR LENS PLACEMENT (IOC);  Surgeon: [name], MD;  Location: [facility] OR;  Service: Ophthalmology;  Laterality: Left;    Social History Marcus Downs  reports that he quit smoking about 17 years ago. His smoking use included Cigarettes. He has a 30 pack-year smoking history. He has never used smokeless tobacco. He reports that he does not drink alcohol or use illicit drugs.  family history includes Diabetes in his sister; Heart attack (age of onset: 53) in his father; Hypertension in his sister. There is no history of Stroke or Cancer.  Allergies  Allergen Reactions  . Tape Rash    Burns       PHYSICAL EXAMINATION:Vital signs: BP 128/60  Pulse 82  Ht 5\' 11"  (1.803 m)  Wt 180 lb (81.647 kg)  BMI 25.12 kg/m2  SpO2 98%  Constitutional: Chronically ill -appearing, no acute distress. In wheelchair Psychiatric: alert and oriented x3, cooperative Eyes: extraocular movements intact, anicteric, conjunctiva pink Mouth: oral pharynx moist, no lesions Neck: supple no lymphadenopathy Cardiovascular: heart regular rate and rhythm, no murmur Lungs: clear to auscultation bilaterally Abdomen: soft, nontender, nondistended, no obvious ascites, no peritoneal signs, normal bowel sounds, no organomegaly Rectal: Prior exam elsewhere with heme-negative stool Extremities: Trace lower extremity edema bilaterally Skin: no lesions on visible extremities Neuro: Dense left hemiparesis    ASSESSMENT:  #1. Problems with nausea vomiting and diarrhea. Most likely norovirus. Resolved. Expectant management. #2. No evidence for iron deficiency  anemia with normal hemoglobin, MCV, and ferritin #3. Colon cancer screening. Discussed options. Not a good candidate for optical colonoscopy. Offered Cologaurd screening tool. Initially he was interested. Subsequently declined saying "if I have cancer, I have cancer and it's my time...".  Told that if he changes his mind, contact the office and we can arrange for the study to be done. He could discuss this further with his PCP or have this arranged through his PCPs office as well. #4. GI followup as needed

## 2013-12-14 DIAGNOSIS — E46 Unspecified protein-calorie malnutrition: Secondary | ICD-10-CM | POA: Diagnosis not present

## 2013-12-14 DIAGNOSIS — A498 Other bacterial infections of unspecified site: Secondary | ICD-10-CM | POA: Diagnosis not present

## 2013-12-14 DIAGNOSIS — I69959 Hemiplegia and hemiparesis following unspecified cerebrovascular disease affecting unspecified side: Secondary | ICD-10-CM | POA: Diagnosis not present

## 2013-12-14 DIAGNOSIS — E119 Type 2 diabetes mellitus without complications: Secondary | ICD-10-CM | POA: Diagnosis not present

## 2013-12-14 DIAGNOSIS — N390 Urinary tract infection, site not specified: Secondary | ICD-10-CM | POA: Diagnosis not present

## 2013-12-14 DIAGNOSIS — IMO0001 Reserved for inherently not codable concepts without codable children: Secondary | ICD-10-CM | POA: Diagnosis not present

## 2013-12-15 ENCOUNTER — Other Ambulatory Visit: Payer: Self-pay | Admitting: Family Medicine

## 2013-12-16 DIAGNOSIS — E46 Unspecified protein-calorie malnutrition: Secondary | ICD-10-CM | POA: Diagnosis not present

## 2013-12-16 DIAGNOSIS — N390 Urinary tract infection, site not specified: Secondary | ICD-10-CM | POA: Diagnosis not present

## 2013-12-16 DIAGNOSIS — I69959 Hemiplegia and hemiparesis following unspecified cerebrovascular disease affecting unspecified side: Secondary | ICD-10-CM | POA: Diagnosis not present

## 2013-12-16 DIAGNOSIS — IMO0001 Reserved for inherently not codable concepts without codable children: Secondary | ICD-10-CM | POA: Diagnosis not present

## 2013-12-16 DIAGNOSIS — A498 Other bacterial infections of unspecified site: Secondary | ICD-10-CM | POA: Diagnosis not present

## 2013-12-16 DIAGNOSIS — E119 Type 2 diabetes mellitus without complications: Secondary | ICD-10-CM | POA: Diagnosis not present

## 2013-12-21 DIAGNOSIS — N390 Urinary tract infection, site not specified: Secondary | ICD-10-CM | POA: Diagnosis not present

## 2013-12-21 DIAGNOSIS — A498 Other bacterial infections of unspecified site: Secondary | ICD-10-CM | POA: Diagnosis not present

## 2013-12-21 DIAGNOSIS — E46 Unspecified protein-calorie malnutrition: Secondary | ICD-10-CM | POA: Diagnosis not present

## 2013-12-21 DIAGNOSIS — IMO0001 Reserved for inherently not codable concepts without codable children: Secondary | ICD-10-CM | POA: Diagnosis not present

## 2013-12-21 DIAGNOSIS — I69959 Hemiplegia and hemiparesis following unspecified cerebrovascular disease affecting unspecified side: Secondary | ICD-10-CM | POA: Diagnosis not present

## 2013-12-21 DIAGNOSIS — E119 Type 2 diabetes mellitus without complications: Secondary | ICD-10-CM | POA: Diagnosis not present

## 2013-12-23 DIAGNOSIS — IMO0001 Reserved for inherently not codable concepts without codable children: Secondary | ICD-10-CM | POA: Diagnosis not present

## 2013-12-23 DIAGNOSIS — I69959 Hemiplegia and hemiparesis following unspecified cerebrovascular disease affecting unspecified side: Secondary | ICD-10-CM | POA: Diagnosis not present

## 2013-12-23 DIAGNOSIS — A498 Other bacterial infections of unspecified site: Secondary | ICD-10-CM | POA: Diagnosis not present

## 2013-12-23 DIAGNOSIS — E119 Type 2 diabetes mellitus without complications: Secondary | ICD-10-CM | POA: Diagnosis not present

## 2013-12-23 DIAGNOSIS — N390 Urinary tract infection, site not specified: Secondary | ICD-10-CM | POA: Diagnosis not present

## 2013-12-23 DIAGNOSIS — E46 Unspecified protein-calorie malnutrition: Secondary | ICD-10-CM | POA: Diagnosis not present

## 2013-12-28 DIAGNOSIS — IMO0001 Reserved for inherently not codable concepts without codable children: Secondary | ICD-10-CM | POA: Diagnosis not present

## 2013-12-28 DIAGNOSIS — E119 Type 2 diabetes mellitus without complications: Secondary | ICD-10-CM | POA: Diagnosis not present

## 2013-12-28 DIAGNOSIS — E46 Unspecified protein-calorie malnutrition: Secondary | ICD-10-CM | POA: Diagnosis not present

## 2013-12-28 DIAGNOSIS — I69959 Hemiplegia and hemiparesis following unspecified cerebrovascular disease affecting unspecified side: Secondary | ICD-10-CM | POA: Diagnosis not present

## 2013-12-28 DIAGNOSIS — N390 Urinary tract infection, site not specified: Secondary | ICD-10-CM | POA: Diagnosis not present

## 2013-12-28 DIAGNOSIS — A498 Other bacterial infections of unspecified site: Secondary | ICD-10-CM | POA: Diagnosis not present

## 2013-12-30 DIAGNOSIS — I69959 Hemiplegia and hemiparesis following unspecified cerebrovascular disease affecting unspecified side: Secondary | ICD-10-CM | POA: Diagnosis not present

## 2013-12-30 DIAGNOSIS — N390 Urinary tract infection, site not specified: Secondary | ICD-10-CM | POA: Diagnosis not present

## 2013-12-30 DIAGNOSIS — A498 Other bacterial infections of unspecified site: Secondary | ICD-10-CM | POA: Diagnosis not present

## 2013-12-30 DIAGNOSIS — IMO0001 Reserved for inherently not codable concepts without codable children: Secondary | ICD-10-CM | POA: Diagnosis not present

## 2013-12-30 DIAGNOSIS — E46 Unspecified protein-calorie malnutrition: Secondary | ICD-10-CM | POA: Diagnosis not present

## 2013-12-30 DIAGNOSIS — E119 Type 2 diabetes mellitus without complications: Secondary | ICD-10-CM | POA: Diagnosis not present

## 2014-01-12 ENCOUNTER — Other Ambulatory Visit: Payer: Self-pay | Admitting: Family Medicine

## 2014-01-25 DIAGNOSIS — H409 Unspecified glaucoma: Secondary | ICD-10-CM | POA: Diagnosis not present

## 2014-01-25 DIAGNOSIS — H2510 Age-related nuclear cataract, unspecified eye: Secondary | ICD-10-CM | POA: Diagnosis not present

## 2014-01-25 DIAGNOSIS — H47239 Glaucomatous optic atrophy, unspecified eye: Secondary | ICD-10-CM | POA: Diagnosis not present

## 2014-01-25 DIAGNOSIS — H4011X0 Primary open-angle glaucoma, stage unspecified: Secondary | ICD-10-CM | POA: Diagnosis not present

## 2014-02-24 ENCOUNTER — Other Ambulatory Visit: Payer: Self-pay

## 2014-03-29 ENCOUNTER — Other Ambulatory Visit: Payer: Self-pay | Admitting: Family Medicine

## 2014-05-18 ENCOUNTER — Telehealth: Payer: Self-pay

## 2014-05-18 NOTE — Telephone Encounter (Signed)
Jasmine DecemberSharon with [facility] request A1C results 90 days prior to or after 11/18/2013. Jasmine DecemberSharon said they supply pt with diabetic supplies and new guidelines with medicare require [facility] keeps and updated log on A1Cs. Jasmine DecemberSharon does not have signed release from pt to release information. Spoke with pt and he does not want information released to [facility]. Pt is not sure who he gets his diabetic supplies from and I explained the reason [facility] was requesting A1C results but pt said it was not any of their business and did not want A1C results released. No info was released.

## 2014-05-30 ENCOUNTER — Telehealth: Payer: Self-pay | Admitting: Family Medicine

## 2014-05-30 NOTE — Telephone Encounter (Signed)
Diabetic Bundle.  Pt needs lab appt to check LDL.  Pt will arrange transportation and call back.

## 2014-05-31 ENCOUNTER — Other Ambulatory Visit: Payer: Self-pay | Admitting: Family Medicine

## 2014-06-02 ENCOUNTER — Other Ambulatory Visit: Payer: Self-pay | Admitting: Family Medicine

## 2014-06-02 DIAGNOSIS — I10 Essential (primary) hypertension: Secondary | ICD-10-CM

## 2014-06-02 DIAGNOSIS — E119 Type 2 diabetes mellitus without complications: Secondary | ICD-10-CM

## 2014-06-02 DIAGNOSIS — E785 Hyperlipidemia, unspecified: Secondary | ICD-10-CM

## 2014-06-03 ENCOUNTER — Other Ambulatory Visit (INDEPENDENT_AMBULATORY_CARE_PROVIDER_SITE_OTHER): Payer: Medicare Other

## 2014-06-03 DIAGNOSIS — I10 Essential (primary) hypertension: Secondary | ICD-10-CM | POA: Diagnosis not present

## 2014-06-03 DIAGNOSIS — E785 Hyperlipidemia, unspecified: Secondary | ICD-10-CM

## 2014-06-03 DIAGNOSIS — E119 Type 2 diabetes mellitus without complications: Secondary | ICD-10-CM

## 2014-06-03 LAB — HEMOGLOBIN A1C: HEMOGLOBIN A1C: 5.9 % (ref 4.6–6.5)

## 2014-06-03 LAB — RENAL FUNCTION PANEL
Albumin: 3.4 g/dL — ABNORMAL LOW (ref 3.5–5.2)
BUN: 14 mg/dL (ref 6–23)
CO2: 28 meq/L (ref 19–32)
CREATININE: 1 mg/dL (ref 0.4–1.5)
Calcium: 9.1 mg/dL (ref 8.4–10.5)
Chloride: 104 mEq/L (ref 96–112)
GFR: 100.12 mL/min (ref >60.00)
Glucose, Bld: 88 mg/dL (ref 70–99)
Phosphorus: 3.2 mg/dL (ref 2.3–4.6)
Potassium: 3.2 mEq/L — ABNORMAL LOW (ref 3.5–5.1)
SODIUM: 139 meq/L (ref 135–145)

## 2014-06-03 LAB — LIPID PANEL
Cholesterol: 89 mg/dL (ref 0–200)
HDL: 48 mg/dL (ref >39.00)
LDL Cholesterol: 32 mg/dL (ref 0–99)
NonHDL: 41
Total CHOL/HDL Ratio: 2
Triglycerides: 44 mg/dL (ref 0.0–149.0)
VLDL: 8.8 mg/dL (ref 0.0–40.0)

## 2014-06-09 NOTE — Telephone Encounter (Signed)
Marcus Downs with [facility] called for A1C results; advised Marcus Downs from 05/18/14 note and Marcus Downs voiced understanding.

## 2014-06-10 ENCOUNTER — Ambulatory Visit (INDEPENDENT_AMBULATORY_CARE_PROVIDER_SITE_OTHER): Payer: Medicare Other | Admitting: Family Medicine

## 2014-06-10 ENCOUNTER — Encounter: Payer: Self-pay | Admitting: Family Medicine

## 2014-06-10 VITALS — BP 130/50 | HR 64 | Temp 97.7°F | Ht 71.0 in | Wt 186.8 lb

## 2014-06-10 DIAGNOSIS — E118 Type 2 diabetes mellitus with unspecified complications: Secondary | ICD-10-CM | POA: Diagnosis not present

## 2014-06-10 DIAGNOSIS — E785 Hyperlipidemia, unspecified: Secondary | ICD-10-CM

## 2014-06-10 DIAGNOSIS — Z23 Encounter for immunization: Secondary | ICD-10-CM | POA: Diagnosis not present

## 2014-06-10 DIAGNOSIS — I69959 Hemiplegia and hemiparesis following unspecified cerebrovascular disease affecting unspecified side: Secondary | ICD-10-CM | POA: Diagnosis not present

## 2014-06-10 DIAGNOSIS — N259 Disorder resulting from impaired renal tubular function, unspecified: Secondary | ICD-10-CM

## 2014-06-10 DIAGNOSIS — I10 Essential (primary) hypertension: Secondary | ICD-10-CM

## 2014-06-10 MED ORDER — AMLODIPINE-ATORVASTATIN 10-40 MG PO TABS: 1.0000 | ORAL_TABLET | Freq: Every day | ORAL | Status: DC

## 2014-06-10 MED ORDER — METFORMIN HCL 500 MG PO TABS: 500.0000 mg | ORAL_TABLET | Freq: Two times a day (BID) | ORAL | Status: DC

## 2014-06-10 MED ORDER — POTASSIUM CHLORIDE ER 10 MEQ PO TBCR: EXTENDED_RELEASE_TABLET | ORAL | Status: DC

## 2014-06-10 NOTE — Assessment & Plan Note (Signed)
Now has nurse aide who comes to house MWF and can check blood pressures and sugars

## 2014-06-10 NOTE — Progress Notes (Signed)
BP 130/50  Pulse 64  Temp(Src) 97.7 F (36.5 C) (Oral)  Ht 5\' 11"  (1.803 m)  Wt 186 lb 12 oz (84.709 kg)  BMI 26.06 kg/m2   CC: DM f/u  Subjective:    Patient ID: Marcus Downs, male    DOB: [birth year], 73 y.o.   MRN: [id number]  HPI: Marcus Downs is a 73 y.o. 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 Presents with nurse aide.  Wt Readings from Last 3 Encounters:  06/10/14 186 lb 12 oz (84.709 kg)  12/10/13 180 lb (81.647 kg)  11/08/13 183 lb 12 oz (83.348 kg)   DM - regularly does check sugars MWF when aide comes out to house.  Checks fasting.  Sugars much better controlled recently, highest 150, lowest 80.  Compliant with antihyperglycemic regimen which includes: novolog 70/30 25u bid, metformin 850mg  twice daily.  Denies low sugars or hypoglycemic symptoms.  Denies paresthesias. Last diabetic eye exam 01/2014, has f/u scheduled 07/2014.  Pneumovax: 06/2010.  Prevnar: today.   HLD - complaint with caduet without myalgias. HTN - compliant with current regimen. Nurse aide checks bp at home.  Relevant past medical, surgical, family and social history reviewed and updated as indicated.  Allergies and medications reviewed and updated. Current Outpatient Prescriptions on File Prior to Visit  Medication Sig  . aspirin 325 MG EC tablet Take 325 mg by mouth daily. For stroke prevention    . diclofenac sodium (VOLTAREN) 1 % GEL Apply 1 application topically 3 (three) times daily as needed.  . insulin aspart protamine-insulin aspart (NOVOLOG 70/30) (70-30) 100 UNIT/ML injection Inject 25 Units into the skin 2 (two) times daily with a meal.   . latanoprost (XALATAN) 0.005 % ophthalmic solution Place 1 drop into both eyes at bedtime.  . Loperamide HCl (IMODIUM PO) Take 1 tablet by mouth as needed.   Marland Kitchen losartan-hydrochlorothiazide (HYZAAR) 100-25  MG per tablet TAKE ONE TABLET BY MOUTH ONCE DAILY  . TAZTIA XT 360 MG 24 hr capsule TAKE ONE CAPSULE BY MOUTH EVERY DAY  . [DISCONTINUED] diltiazem (CARDIZEM CD) 360 MG 24 hr capsule Take 1 capsule (360 mg total) by mouth daily.   No current facility-administered medications on file prior to visit.    Review of Systems Per HPI unless specifically indicated above    Objective:    BP 130/50  Pulse 64  Temp(Src) 97.7 F (36.5 C) (Oral)  Ht 5\' 11"  (1.803 m)  Wt 186 lb 12 oz (84.709 kg)  BMI 26.06 kg/m2  Physical Exam  Nursing note and vitals reviewed. Constitutional: He appears well-developed and well-nourished. No distress.  HENT:  Mouth/Throat: Oropharynx is clear and moist. No oropharyngeal exudate.  Cardiovascular: Normal rate, regular rhythm, normal heart sounds and intact distal pulses.   No murmur heard. Pulmonary/Chest: Effort normal and breath sounds normal. No respiratory distress. He has no wheezes. He has no rales.  Musculoskeletal: He exhibits edema (L>R).  L foot in brace  Neurological:  Residual hemiparesis   Results for orders placed in visit on 06/03/14  RENAL FUNCTION PANEL      Result Value Ref Range   Sodium 139  135 - 145 mEq/L   Potassium 3.2 (*) 3.5 - 5.1 mEq/L   Chloride 104  96 - 112 mEq/L   CO2 28  19 - 32 mEq/L   Calcium  9.1  8.4 - 10.5 mg/dL   Albumin 3.4 (*) 3.5 - 5.2 g/dL   BUN 14  6 - 23 mg/dL   Creatinine, Ser 1.0  0.4 - 1.5 mg/dL   Glucose, Bld 88  70 - 99 mg/dL   Phosphorus 3.2  2.3 - 4.6 mg/dL   GFR 161.09100.12  >60.45>60.00 mL/min  LIPID PANEL      Result Value Ref Range   Cholesterol 89  0 - 200 mg/dL   Triglycerides 40.944.0  0.0 - 149.0 mg/dL   HDL 81.1948.00  >14.78>39.00 mg/dL   VLDL 8.8  0.0 - 29.540.0 mg/dL   LDL Cholesterol 32  0 - 99 mg/dL   Total CHOL/HDL Ratio 2     NonHDL 41.00    HEMOGLOBIN A1C      Result Value Ref Range   Hemoglobin A1C 5.9  4.6 - 6.5 %      Assessment & Plan:   Problem List Items Addressed This Visit   RENAL  INSUFFICIENCY      Cr significantly improved with better bp and cbg control. Lab Results  Component Value Date   CREATININE 1.0 06/03/2014      HYPERTENSION     Chronic, stable. Continue regimen. For hypokalemia - increase potassium to 10mEq TID.    Relevant Medications      CADUET 10-40 MG PO TABS   HYPERLIPIDEMIA     Wonderful control on high dose caduet - will decrease caduet to 40/10mg  daily and recheck FLP in 3-6 mo.    Relevant Medications      CADUET 10-40 MG PO TABS   Diabetes mellitus type 2, controlled, with complications     Great control today, concern for overtreatment based on recent A1c of 5.9%. Reviewed times to check cbgs as well as goal fasting and goal postprandial. Decrease metformin to 500mg  bid. Pt agrees with plan.    Relevant Medications      metFORMIN (GLUCOPHAGE) tablet   CVA WITH LEFT HEMIPARESIS     Now has nurse aide who comes to house MWF and can check blood pressures and sugars     Other Visit Diagnoses   Need for prophylactic vaccination against Streptococcus pneumoniae (pneumococcus)    -  Primary    Relevant Orders       Pneumococcal conjugate vaccine 13-valent (Completed)        Follow up plan: Return in about 4 months (around 10/11/2014), or as needed, for follow up visit.

## 2014-06-10 NOTE — Assessment & Plan Note (Signed)
Great control today, concern for overtreatment based on recent A1c of 5.9%. Reviewed times to check cbgs as well as goal fasting and goal postprandial. Decrease metformin to 500mg  bid. Pt agrees with plan.

## 2014-06-10 NOTE — Progress Notes (Signed)
Pre visit review using our clinic review tool, if applicable. No additional management support is needed unless otherwise documented below in the visit note. 

## 2014-06-10 NOTE — Assessment & Plan Note (Signed)
Wonderful control on high dose caduet - will decrease caduet to 40/10mg  daily and recheck FLP in 3-6 mo.

## 2014-06-10 NOTE — Patient Instructions (Addendum)
prevnar today. When checking sugars check either before a meal or 2 hours after a meal. Goal before a meal is 80-120. Goal 2 hours after a meal is <180.  If any sugars <70, let me know. If sugars trending >200, let me know as well. Let's decrease metformin to 500mg  twice daily (new dose sent to pharmacy). Increase potassium pill to three times daily. Decrease amlodipine-atorvastatin from 10-80 daily to 10-40mg  once daily.

## 2014-06-10 NOTE — Assessment & Plan Note (Signed)
Cr significantly improved with better bp and cbg control. Lab Results  Component Value Date   CREATININE 1.0 06/03/2014

## 2014-06-10 NOTE — Assessment & Plan Note (Addendum)
Chronic, stable. Continue regimen. For hypokalemia - increase potassium to TID.

## 2014-06-27 ENCOUNTER — Telehealth: Payer: Self-pay

## 2014-06-27 NOTE — Telephone Encounter (Signed)
Marcus Downs pts daughter in law said for last several days pt's 2 hr after breakfast BS has been 240 - 258. Marcus HummerCharlene is not positive of what med pt is presently taking; she thinks what is on current med list should be correct. Pt has been eating same foods for some time now maybe 4 - 5 months. For breakfast pt eats sausage biscuit and drinks OJ. For other meals pt eats roasted chicken and vegetables. Pt has no diabetic symptoms; pt states he feels fine. Marcus Downs did not know if med needed to be adjusted or what Dr Reece AgarG wanted to do. [facility] [place]. Marcus Downs request cb.

## 2014-06-28 NOTE — Telephone Encounter (Signed)
Confirmed with Charlene what meds he is currently taking and they were correct. Advised to have his sugar checked daily 2 hours after breakfast x 1 week and to call me back with an update. She verbalized understanding.

## 2014-06-28 NOTE — Telephone Encounter (Signed)
Noted. plz ensure taking novolog 70/30 25u twice daily with meals. We did recently decrease metformin from 850 bid to 500mg  bid. Given his good control to date, recommend monitoring sugars for now without change to regimen. Update me in 1 week if persistent high sugars 2 hrs after breakfast or at any other time.

## 2014-07-19 LAB — HM DIABETES EYE EXAM

## 2014-07-26 DIAGNOSIS — H43819 Vitreous degeneration, unspecified eye: Secondary | ICD-10-CM | POA: Diagnosis not present

## 2014-07-26 DIAGNOSIS — H47239 Glaucomatous optic atrophy, unspecified eye: Secondary | ICD-10-CM | POA: Diagnosis not present

## 2014-07-26 DIAGNOSIS — H4011X0 Primary open-angle glaucoma, stage unspecified: Secondary | ICD-10-CM | POA: Diagnosis not present

## 2014-09-02 ENCOUNTER — Other Ambulatory Visit: Payer: Self-pay

## 2014-10-17 ENCOUNTER — Encounter: Payer: Self-pay | Admitting: Family Medicine

## 2014-10-17 ENCOUNTER — Ambulatory Visit (INDEPENDENT_AMBULATORY_CARE_PROVIDER_SITE_OTHER): Payer: Medicare Other | Admitting: Family Medicine

## 2014-10-17 VITALS — BP 130/60 | HR 84 | Temp 98.1°F | Wt 201.5 lb

## 2014-10-17 DIAGNOSIS — I69959 Hemiplegia and hemiparesis following unspecified cerebrovascular disease affecting unspecified side: Secondary | ICD-10-CM | POA: Diagnosis not present

## 2014-10-17 DIAGNOSIS — E118 Type 2 diabetes mellitus with unspecified complications: Secondary | ICD-10-CM

## 2014-10-17 DIAGNOSIS — Z23 Encounter for immunization: Secondary | ICD-10-CM

## 2014-10-17 DIAGNOSIS — E46 Unspecified protein-calorie malnutrition: Secondary | ICD-10-CM | POA: Diagnosis not present

## 2014-10-17 DIAGNOSIS — B351 Tinea unguium: Secondary | ICD-10-CM | POA: Diagnosis not present

## 2014-10-17 DIAGNOSIS — I10 Essential (primary) hypertension: Secondary | ICD-10-CM

## 2014-10-17 DIAGNOSIS — E785 Hyperlipidemia, unspecified: Secondary | ICD-10-CM

## 2014-10-17 LAB — RENAL FUNCTION PANEL
Albumin: 3.7 g/dL (ref 3.5–5.2)
BUN: 16 mg/dL (ref 6–23)
CO2: 27 mEq/L (ref 19–32)
Calcium: 8.9 mg/dL (ref 8.4–10.5)
Chloride: 98 mEq/L (ref 96–112)
Creatinine, Ser: 1.1 mg/dL (ref 0.4–1.5)
GFR: 88.14 mL/min (ref >60.00)
GLUCOSE: 134 mg/dL — AB (ref 70–99)
POTASSIUM: 3.9 meq/L (ref 3.5–5.1)
Phosphorus: 2.2 mg/dL — ABNORMAL LOW (ref 2.3–4.6)
SODIUM: 133 meq/L — AB (ref 135–145)

## 2014-10-17 LAB — HEMOGLOBIN A1C: Hgb A1c MFr Bld: 8.3 % — ABNORMAL HIGH (ref 4.6–6.5)

## 2014-10-17 MED ORDER — DICLOFENAC SODIUM 1 % TD GEL: 1.0000 "application " | Freq: Three times a day (TID) | TRANSDERMAL | Status: DC | PRN

## 2014-10-17 NOTE — Assessment & Plan Note (Signed)
Discussed foot care. Pt will consider podiatry referral.

## 2014-10-17 NOTE — Patient Instructions (Addendum)
Ask eye doctor to send me his report (card provided today). Return in 4 months for medicare wellness visit. Good to see you today, call us with questions.

## 2014-10-17 NOTE — Progress Notes (Signed)
Pre visit review using our clinic review tool, if applicable. No additional management support is needed unless otherwise documented below in the visit note. 

## 2014-10-17 NOTE — Assessment & Plan Note (Signed)
Chronic, stable. Continue regimen. 

## 2014-10-17 NOTE — Assessment & Plan Note (Signed)
Recently decreased dose of caduet. Check FLP next visit at wellness exam.

## 2014-10-17 NOTE — Assessment & Plan Note (Addendum)
Good control as of last A1c but now endorses deteriorated control at home. Goal A1c <8% given comorbidities and difficulty with checking cbg's at home. Foot exam today. UTD eye exam. Check A1c and renal panel.

## 2014-10-17 NOTE — Progress Notes (Signed)
BP 130/60 mmHg  Pulse 84  Temp(Src) 98.1 F (36.7 C) (Oral)  Wt 201 lb 8 oz (91.4 kg)   CC: 4 mo f/u visit  Subjective:    Patient ID: Marcus LankSamuel J Lehnert Jr., male    DOB: 05/30/1941, 73 y.o.   MRN: [id number]  HPI: Marcus LankSamuel J Callaway Jr. is a 73 y.o. 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 Presents with nurse aide.  DM - regularly does check sugars MWF (2hrs post prandial) when aide comes out to house. Sugars running high recently - 200-300s.  Last A1c 5.9 so we decreased metformin from 850 to 500mg  bid. Compliant with antihyperglycemic regimen which includes: novolog 70/30 25u bid, metformin 500mg  twice daily. Denies low sugars or hypoglycemic symptoms. Denies paresthesias. Last diabetic eye exam 07/2014. Pneumovax: 06/2010. Prevnar: 2015.  Lab Results  Component Value Date   HGB 13.1 11/08/2013   HTN - Compliant with current antihypertensive regimen of caduet 10/40, hyzaar 100/25, Kdur 10mEq tid, diltiazem CD 360mg  daily.  Does check blood pressures at home: irregularly but aide states they are well controlled.  No low blood pressure readings or symptoms of dizziness/syncope.  Denies HA, vision changes, CP/tightness, SOB, leg swelling.    Relevant past medical, surgical, family and social history reviewed and updated as indicated. Interim medical history since our last visit reviewed. Allergies and medications reviewed and updated.  Current Outpatient Prescriptions on File Prior to Visit  Medication Sig  . amLODipine-atorvastatin (CADUET) 10-40 MG per tablet Take 1 tablet by mouth daily.  Marland Kitchen. aspirin 325 MG EC tablet Take 325 mg by mouth daily. For stroke prevention    . insulin aspart protamine-insulin aspart (NOVOLOG 70/30) (70-30) 100 UNIT/ML injection Inject 25 Units into the skin 2 (two) times daily with a meal.   . latanoprost (XALATAN)  0.005 % ophthalmic solution Place 1 drop into both eyes at bedtime.  . Loperamide HCl (IMODIUM PO) Take 1 tablet by mouth as needed.   Marland Kitchen. losartan-hydrochlorothiazide (HYZAAR) 100-25 MG per tablet TAKE ONE TABLET BY MOUTH ONCE DAILY  . metFORMIN (GLUCOPHAGE) 500 MG tablet Take 1 tablet (500 mg total) by mouth 2 (two) times daily with a meal.  . potassium chloride (K-DUR) 10 MEQ tablet TAKE THREE TABLETS BY MOUTH EVERY DAY  . TAZTIA XT 360 MG 24 hr capsule TAKE ONE CAPSULE BY MOUTH EVERY DAY  . [DISCONTINUED] diltiazem (CARDIZEM CD) 360 MG 24 hr capsule Take 1 capsule (360 mg total) by mouth daily.   No current facility-administered medications on file prior to visit.    Review of Systems Per HPI unless specifically indicated above     Objective:    BP 130/60 mmHg  Pulse 84  Temp(Src) 98.1 F (36.7 C) (Oral)  Wt 201 lb 8 oz (91.4 kg)  Wt Readings from Last 3 Encounters:  10/17/14 201 lb 8 oz (91.4 kg)  06/10/14 186 lb 12 oz (84.709 kg)  12/10/13 180 lb (81.647 kg)    Physical Exam  Constitutional: He appears well-developed and well-nourished. No distress.  In wheelchair and firm L ankle brace  HENT:  Head: Normocephalic and atraumatic.  Right Ear: External ear normal.  Left Ear: External ear normal.  Nose: Nose normal.  Mouth/Throat: Oropharynx is clear and moist. No oropharyngeal exudate.  Eyes: Conjunctivae and EOM are normal. Pupils are equal, round, and reactive  to light. No scleral icterus.  Neck: Normal range of motion. Neck supple.  Cardiovascular: Normal rate, regular rhythm, normal heart sounds and intact distal pulses.   No murmur heard. Pulmonary/Chest: Effort normal and breath sounds normal. No respiratory distress. He has no wheezes. He has no rales.  Musculoskeletal: He exhibits no edema.  Diabetic foot exam: Scaly soles bilaterally Interdigital skin maceration No calluses  Diminshed DP/PT pulses Normal sensation to light touch and monofilament Nails  thickened/onychomycotic  Lymphadenopathy:    He has no cervical adenopathy.  Neurological:  Residual L hemiparesis  Skin: Skin is warm and dry. No rash noted.  Psychiatric: He has a normal mood and affect.  Nursing note and vitals reviewed.  Results for orders placed or performed in visit on 10/17/14  HM DIABETES EYE EXAM  Result Value Ref Range   HM Diabetic Eye Exam No Retinopathy No Retinopathy      Assessment & Plan:   Problem List Items Addressed This Visit    Protein calorie malnutrition    Recheck today.    Onychomycosis of toenail    Discussed foot care. Pt will consider podiatry referral.    HLD (hyperlipidemia)    Recently decreased dose of caduet. Check FLP next visit at wellness exam.    Hemiplegia, late effect of cerebrovascular disease    Chronic, stable. Continue home health aide MWF.    Essential hypertension    Chronic, stable. Continue regimen.    Diabetes mellitus type 2, controlled, with complications - Primary    Good control as of last A1c but now endorses deteriorated control at home. Goal A1c <8% given comorbidities and difficulty with checking cbg's at home. Foot exam today. UTD eye exam. Check A1c and renal panel.    Relevant Orders      Hemoglobin A1c      Renal function panel    Other Visit Diagnoses    Need for influenza vaccination        Relevant Orders       Flu Vaccine QUAD 36+ mos PF IM (Fluarix Quad PF) (Completed)        Follow up plan: Return in about 4 months (around 02/15/2015), or as needed, for medicare wellness.

## 2014-10-17 NOTE — Assessment & Plan Note (Addendum)
Chronic, stable. Continue home health aide MWF.

## 2014-10-17 NOTE — Assessment & Plan Note (Signed)
Recheck today. 

## 2014-10-18 ENCOUNTER — Telehealth: Payer: Self-pay | Admitting: Family Medicine

## 2014-10-18 NOTE — Telephone Encounter (Signed)
emmi emailed °

## 2014-10-19 ENCOUNTER — Other Ambulatory Visit: Payer: Self-pay | Admitting: Family Medicine

## 2014-10-19 MED ORDER — METFORMIN HCL 850 MG PO TABS: 850.0000 mg | ORAL_TABLET | Freq: Two times a day (BID) | ORAL | Status: DC

## 2014-10-25 ENCOUNTER — Other Ambulatory Visit: Payer: Self-pay | Admitting: Family Medicine

## 2014-10-31 ENCOUNTER — Ambulatory Visit: Payer: Self-pay | Admitting: Podiatry

## 2014-10-31 ENCOUNTER — Encounter: Payer: Self-pay | Admitting: Podiatry

## 2014-10-31 ENCOUNTER — Ambulatory Visit (INDEPENDENT_AMBULATORY_CARE_PROVIDER_SITE_OTHER): Payer: Medicare Other | Admitting: Podiatry

## 2014-10-31 VITALS — BP 149/87 | HR 99 | Resp 15 | Ht 71.0 in | Wt 203.0 lb

## 2014-10-31 DIAGNOSIS — B351 Tinea unguium: Secondary | ICD-10-CM | POA: Diagnosis not present

## 2014-10-31 DIAGNOSIS — M79676 Pain in unspecified toe(s): Secondary | ICD-10-CM

## 2014-10-31 NOTE — Patient Instructions (Signed)
Diabetes and Foot Care Diabetes may cause you to have problems because of poor blood supply (circulation) to your feet and legs. This may cause the skin on your feet to become thinner, break easier, and heal more slowly. Your skin may become dry, and the skin may peel and crack. You may also have nerve damage in your legs and feet causing decreased feeling in them. You may not notice minor injuries to your feet that could lead to infections or more serious problems. Taking care of your feet is one of the most important things you can do for yourself.  HOME CARE INSTRUCTIONS  Wear shoes at all times, even in the house. Do not go barefoot. Bare feet are easily injured.  Check your feet daily for blisters, cuts, and redness. If you cannot see the bottom of your feet, use a mirror or ask someone for help.  Wash your feet with warm water (do not use hot water) and mild soap. Then pat your feet and the areas between your toes until they are completely dry. Do not soak your feet as this can dry your skin.  Apply a moisturizing lotion or petroleum jelly (that does not contain alcohol and is unscented) to the skin on your feet and to dry, brittle toenails. Do not apply lotion between your toes.  Trim your toenails straight across. Do not dig under them or around the cuticle. File the edges of your nails with an emery board or nail file.  Do not cut corns or calluses or try to remove them with medicine.  Wear clean socks or stockings every day. Make sure they are not too tight. Do not wear knee-high stockings since they may decrease blood flow to your legs.  Wear shoes that fit properly and have enough cushioning. To break in new shoes, wear them for just a few hours a day. This prevents you from injuring your feet. Always look in your shoes before you put them on to be sure there are no objects inside.  Do not cross your legs. This may decrease the blood flow to your feet.  If you find a minor scrape,  cut, or break in the skin on your feet, keep it and the skin around it clean and dry. These areas may be cleansed with mild soap and water. Do not cleanse the area with peroxide, alcohol, or iodine.  When you remove an adhesive bandage, be sure not to damage the skin around it.  If you have a wound, look at it several times a day to make sure it is healing.  Do not use heating pads or hot water bottles. They may burn your skin. If you have lost feeling in your feet or legs, you may not know it is happening until it is too late.  Make sure your health care provider performs a complete foot exam at least annually or more often if you have foot problems. Report any cuts, sores, or bruises to your health care provider immediately. SEEK MEDICAL CARE IF:   You have an injury that is not healing.  You have cuts or breaks in the skin.  You have an ingrown nail.  You notice redness on your legs or feet.  You feel burning or tingling in your legs or feet.  You have pain or cramps in your legs and feet.  Your legs or feet are numb.  Your feet always feel cold. SEEK IMMEDIATE MEDICAL CARE IF:   There is increasing redness,   swelling, or pain in or around a wound.  There is a red line that goes up your leg.  Pus is coming from a wound.  You develop a fever or as directed by your health care provider.  You notice a bad smell coming from an ulcer or wound. Document Released: 11/01/2000 Document Revised: 07/07/2013 Document Reviewed: 04/13/2013 [employer] Patient Information 2015 [employer]. This information is not intended to replace advice given to you by your health care provider. Make sure you discuss any questions you have with your health care provider.  

## 2014-10-31 NOTE — Progress Notes (Signed)
   Subjective:    Patient ID: Denton LankSamuel J Dearmond Jr., male    DOB: 07/18/1941, 73 y.o.   MRN: [id number]  HPI Comments: N diabetic foot exam and debridement L 10 toenails D and O long-term C elongated, tender toenails A diabetic pt and hx of CVA T none recently     Review of Systems  All other systems reviewed and are negative.      Objective:   Physical Exam  Orientated 3 Patient transfers from wheelchair to treatment chair  Vascular: DP and PT pulses 1/4 bilaterally Pitting edema ankles bilaterally  Neurological: Ankle reflexes reactive bilaterally Vibratory sensation intact bilaterally Sensation to 10 g monofilament wire intact 4/5 right and 2/5 left  Dermatological: Toenails are elongated, hypertrophic, discolored 6-10  Musculoskeletal: Pes planus bilaterally Manual motor testing Dorsi and plantar flexion left 0/5 left and 5/5 right      Assessment & Plan:   Assessment: Diabetic with vascular and neurological complications Decrease pedal pulses bilaterally 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

## 2014-11-01 ENCOUNTER — Encounter: Payer: Self-pay | Admitting: Podiatry

## 2014-11-25 DIAGNOSIS — H4011X2 Primary open-angle glaucoma, moderate stage: Secondary | ICD-10-CM | POA: Diagnosis not present

## 2014-11-25 DIAGNOSIS — H26499 Other secondary cataract, unspecified eye: Secondary | ICD-10-CM | POA: Diagnosis not present

## 2014-12-07 ENCOUNTER — Other Ambulatory Visit: Payer: Self-pay | Admitting: *Deleted

## 2014-12-07 MED ORDER — DILTIAZEM HCL ER BEADS 360 MG PO CP24: 360.0000 mg | ORAL_CAPSULE | Freq: Every day | ORAL | Status: DC

## 2015-01-30 ENCOUNTER — Ambulatory Visit: Payer: Medicare Other | Admitting: Podiatry

## 2015-02-01 ENCOUNTER — Ambulatory Visit: Payer: Medicare Other | Admitting: Podiatry

## 2015-02-15 ENCOUNTER — Ambulatory Visit (INDEPENDENT_AMBULATORY_CARE_PROVIDER_SITE_OTHER): Payer: Medicare Other | Admitting: Podiatry

## 2015-02-15 ENCOUNTER — Encounter: Payer: Self-pay | Admitting: Podiatry

## 2015-02-15 DIAGNOSIS — M79676 Pain in unspecified toe(s): Secondary | ICD-10-CM | POA: Diagnosis not present

## 2015-02-15 DIAGNOSIS — B351 Tinea unguium: Secondary | ICD-10-CM

## 2015-02-15 NOTE — Progress Notes (Signed)
Patient ID: Marcus LankSamuel J Fissel Jr., male   DOB: 01/05/1941, 74 y.o.   MRN: [id number]  Subjective: Patient presents today complaining of painful toenails and requests nail debridement  Objective: The toenails are hypertrophic, brittle, discolored with maximum deformity in the right hallux nail  Assessment: Symptomatic onychomycoses 6-10 History of diabetic with vascular and neurological complications  Plan: Debridement toenails 10 without any bleeding The right hallux nail after debridement revealed a serous drainage with a granular base consistent with a sterile abscess. An antibiotic dressing and was applied to the right hallux nail  Patient was advised to apply triple antibiotic ointment to the right hallux nail bed daily and cover with Band-Aids until a scab forms.

## 2015-02-15 NOTE — Patient Instructions (Signed)
Apply triple antibiotic ointment daily to the right big toenail area daily and cover with a Band-Aid until a scab forms  Diabetes and Foot Care Diabetes may cause you to have problems because of poor blood supply (circulation) to your feet and legs. This may cause the skin on your feet to become thinner, break easier, and heal more slowly. Your skin may become dry, and the skin may peel and crack. You may also have nerve damage in your legs and feet causing decreased feeling in them. You may not notice minor injuries to your feet that could lead to infections or more serious problems. Taking care of your feet is one of the most important things you can do for yourself.  HOME CARE INSTRUCTIONS  Wear shoes at all times, even in the house. Do not go barefoot. Bare feet are easily injured.  Check your feet daily for blisters, cuts, and redness. If you cannot see the bottom of your feet, use a mirror or ask someone for help.  Wash your feet with warm water (do not use hot water) and mild soap. Then pat your feet and the areas between your toes until they are completely dry. Do not soak your feet as this can dry your skin.  Apply a moisturizing lotion or petroleum jelly (that does not contain alcohol and is unscented) to the skin on your feet and to dry, brittle toenails. Do not apply lotion between your toes.  Trim your toenails straight across. Do not dig under them or around the cuticle. File the edges of your nails with an emery board or nail file.  Do not cut corns or calluses or try to remove them with medicine.  Wear clean socks or stockings every day. Make sure they are not too tight. Do not wear knee-high stockings since they may decrease blood flow to your legs.  Wear shoes that fit properly and have enough cushioning. To break in new shoes, wear them for just a few hours a day. This prevents you from injuring your feet. Always look in your shoes before you put them on to be sure there are no  objects inside.  Do not cross your legs. This may decrease the blood flow to your feet.  If you find a minor scrape, cut, or break in the skin on your feet, keep it and the skin around it clean and dry. These areas may be cleansed with mild soap and water. Do not cleanse the area with peroxide, alcohol, or iodine.  When you remove an adhesive bandage, be sure not to damage the skin around it.  If you have a wound, look at it several times a day to make sure it is healing.  Do not use heating pads or hot water bottles. They may burn your skin. If you have lost feeling in your feet or legs, you may not know it is happening until it is too late.  Make sure your health care provider performs a complete foot exam at least annually or more often if you have foot problems. Report any cuts, sores, or bruises to your health care provider immediately. SEEK MEDICAL CARE IF:   You have an injury that is not healing.  You have cuts or breaks in the skin.  You have an ingrown nail.  You notice redness on your legs or feet.  You feel burning or tingling in your legs or feet.  You have pain or cramps in your legs and feet.  Your  legs or feet are numb.  Your feet always feel cold. SEEK IMMEDIATE MEDICAL CARE IF:   There is increasing redness, swelling, or pain in or around a wound.  There is a red line that goes up your leg.  Pus is coming from a wound.  You develop a fever or as directed by your health care provider.  You notice a bad smell coming from an ulcer or wound. Document Released: 11/01/2000 Document Revised: 07/07/2013 Document Reviewed: 04/13/2013 [facility] Patient Information 2015 [place], [place]. This information is not intended to replace advice given to you by your health care provider. Make sure you discuss any questions you have with your health care provider.

## 2015-02-20 ENCOUNTER — Ambulatory Visit (INDEPENDENT_AMBULATORY_CARE_PROVIDER_SITE_OTHER): Payer: Medicare Other | Admitting: Family Medicine

## 2015-02-20 ENCOUNTER — Encounter: Payer: Self-pay | Admitting: Family Medicine

## 2015-02-20 VITALS — BP 142/64 | HR 64 | Temp 97.8°F | Wt 210.0 lb

## 2015-02-20 DIAGNOSIS — Z0000 Encounter for general adult medical examination without abnormal findings: Secondary | ICD-10-CM

## 2015-02-20 DIAGNOSIS — Z0001 Encounter for general adult medical examination with abnormal findings: Secondary | ICD-10-CM

## 2015-02-20 DIAGNOSIS — Z4659 Encounter for fitting and adjustment of other gastrointestinal appliance and device: Secondary | ICD-10-CM | POA: Insufficient documentation

## 2015-02-20 DIAGNOSIS — E118 Type 2 diabetes mellitus with unspecified complications: Secondary | ICD-10-CM

## 2015-02-20 DIAGNOSIS — N400 Enlarged prostate without lower urinary tract symptoms: Secondary | ICD-10-CM | POA: Insufficient documentation

## 2015-02-20 DIAGNOSIS — Z1211 Encounter for screening for malignant neoplasm of colon: Secondary | ICD-10-CM | POA: Diagnosis not present

## 2015-02-20 DIAGNOSIS — I69959 Hemiplegia and hemiparesis following unspecified cerebrovascular disease affecting unspecified side: Secondary | ICD-10-CM

## 2015-02-20 DIAGNOSIS — I10 Essential (primary) hypertension: Secondary | ICD-10-CM

## 2015-02-20 DIAGNOSIS — E785 Hyperlipidemia, unspecified: Secondary | ICD-10-CM | POA: Diagnosis not present

## 2015-02-20 DIAGNOSIS — N289 Disorder of kidney and ureter, unspecified: Secondary | ICD-10-CM

## 2015-02-20 DIAGNOSIS — Z7189 Other specified counseling: Secondary | ICD-10-CM | POA: Insufficient documentation

## 2015-02-20 DIAGNOSIS — Z125 Encounter for screening for malignant neoplasm of prostate: Secondary | ICD-10-CM

## 2015-02-20 DIAGNOSIS — E46 Unspecified protein-calorie malnutrition: Secondary | ICD-10-CM

## 2015-02-20 LAB — PSA, MEDICARE: PSA: 1.99 ng/ml (ref 0.10–4.00)

## 2015-02-20 LAB — LIPID PANEL
CHOLESTEROL: 114 mg/dL (ref 0–200)
HDL: 51.9 mg/dL (ref >39.00)
LDL Cholesterol: 52 mg/dL (ref 0–99)
NonHDL: 62.1
TRIGLYCERIDES: 50 mg/dL (ref 0.0–149.0)
Total CHOL/HDL Ratio: 2
VLDL: 10 mg/dL (ref 0.0–40.0)

## 2015-02-20 LAB — COMPREHENSIVE METABOLIC PANEL
ALBUMIN: 3.9 g/dL (ref 3.5–5.2)
ALT: 24 U/L (ref 0–53)
AST: 29 U/L (ref 0–37)
Alkaline Phosphatase: 172 U/L — ABNORMAL HIGH (ref 39–117)
BUN: 15 mg/dL (ref 6–23)
CHLORIDE: 101 meq/L (ref 96–112)
CO2: 30 mEq/L (ref 19–32)
Calcium: 9.7 mg/dL (ref 8.4–10.5)
Creatinine, Ser: 1.03 mg/dL (ref 0.40–1.50)
GFR: 91.02 mL/min (ref >60.00)
Glucose, Bld: 76 mg/dL (ref 70–99)
POTASSIUM: 3.8 meq/L (ref 3.5–5.1)
SODIUM: 137 meq/L (ref 135–145)
Total Bilirubin: 0.5 mg/dL (ref 0.2–1.2)
Total Protein: 7.9 g/dL (ref 6.0–8.3)

## 2015-02-20 LAB — HEMOCCULT GUIAC POC 1CARD (OFFICE): FECAL OCCULT BLD: NEGATIVE

## 2015-02-20 LAB — HEMOGLOBIN A1C: HEMOGLOBIN A1C: 7.8 % — AB (ref 4.6–6.5)

## 2015-02-20 NOTE — Addendum Note (Signed)
Addended by: Baldomero LamyHAVERS, NATASHA C on: 02/20/2015 04:17 PM   Modules accepted: Kipp BroodSmartSet

## 2015-02-20 NOTE — Assessment & Plan Note (Signed)
Enlarged prostate noted on exam today. Pt denies significant sxs but has started OTC prostate med.

## 2015-02-20 NOTE — Assessment & Plan Note (Signed)
-   Recheck Cr today

## 2015-02-20 NOTE — Assessment & Plan Note (Signed)
Continue caduet - check FLP today.

## 2015-02-20 NOTE — Assessment & Plan Note (Signed)
Recheck today. 

## 2015-02-20 NOTE — Assessment & Plan Note (Signed)
Check A1c today.

## 2015-02-20 NOTE — Progress Notes (Signed)
Pre visit review using our clinic review tool, if applicable. No additional management support is needed unless otherwise documented below in the visit note. 

## 2015-02-20 NOTE — Patient Instructions (Addendum)
Sugar goal 2 hours after breakfast <200.  Advanced planning packet provided today.  We will try to have someone come out to the house to go over advanced directive planning. Blood work today. Good to see you, call us with questions, return as needed or in 4-6 months for follow up

## 2015-02-20 NOTE — Assessment & Plan Note (Signed)
Persistent chronic L hemiparesis.

## 2015-02-20 NOTE — Assessment & Plan Note (Signed)

## 2015-02-20 NOTE — Assessment & Plan Note (Signed)
Advanced directives - would want sister, Lafonda MossesDiana to be HCPOA. Advanced packet provided today. Would like nurse to come out to house to review info with him.

## 2015-02-20 NOTE — Assessment & Plan Note (Signed)
Chronic, stable. Ideal goal <140/90 given stroke history.

## 2015-02-20 NOTE — Progress Notes (Signed)
BP 142/64 mmHg  Pulse 64  Temp(Src) 97.8 F (36.6 C) (Oral)  Wt 210 lb (95.255 kg)   CC: medicare wellness visit  Subjective:    Patient ID: Marcus Snavely., male    DOB: 04/27/1941, 74 y.o.   MRN: [id number]  HPI: Marcus Felicetti. is a 74 y.o. male presenting on 02/20/2015 for Annual Exam   Has life alert.  DM - previously metformin caused diarrhea, stable on  bid dosing. Checking sugars 2-3 hours after lunch and averaging 200.  Vision exam upcoming 02/2015 - known trouble on left. H/o glaucoma and cataracts, worse on left.  Hearing screen - passed Mult falls in last year without injury 2/2 chronic L hemiparesis. None in last 6 months.  Denies anhedonia, depression, sadness.   Preventative: Never had colonoscopy. Declines iFOB as well - difficulty completing 2/2 hemiparesis.Hemoccult in office today. Requests prostate check yearly. Brother in law died from prostate cancer.  Flu - 09/2014 Td 2010 Pneumovax - 2011, prevnar 05/2014.  Shingles shot - 08/2012 Advanced directives - would want sister, Lafonda Mosses to be HCPOA. Advanced packet provided today. Would like nurse to come out to house to review info with him.   Lives alone: friend comes and helps sometimes. Family nearby - 2 sisters and nieces/nephews in area. Diet: good water, fruits/vegetables daily  Relevant past medical, surgical, family and social history reviewed and updated as indicated. Interim medical history since our last visit reviewed. Allergies and medications reviewed and updated. Current Outpatient Prescriptions on File Prior to Visit  Medication Sig  . amLODipine-atorvastatin (CADUET) 10-40 MG per tablet Take 1 tablet by mouth daily.  Marland Kitchen aspirin 325 MG EC tablet Take 325 mg by mouth daily. For stroke prevention    . diclofenac sodium (VOLTAREN) 1 % GEL Apply 1 application topically 3 (three) times daily as needed.  . diltiazem (TAZTIA XT) 360 MG 24 hr capsule Take 1 capsule (360 mg total) by mouth  daily.  . insulin aspart protamine-insulin aspart (NOVOLOG 70/30) (70-30) 100 UNIT/ML injection Inject 25 Units into the skin 2 (two) times daily with a meal.   . latanoprost (XALATAN) 0.005 % ophthalmic solution Place 1 drop into both eyes at bedtime.  . Loperamide HCl (IMODIUM PO) Take 1 tablet by mouth as needed.   Marland Kitchen losartan-hydrochlorothiazide (HYZAAR) 100-25 MG per tablet TAKE ONE TABLET BY MOUTH ONCE DAILY  . metFORMIN (GLUCOPHAGE) 850 MG tablet Take 1 tablet (850 mg total) by mouth 2 (two) times daily with a meal.  . potassium chloride (K-DUR) 10 MEQ tablet TAKE THREE TABLETS BY MOUTH EVERY DAY  . [DISCONTINUED] diltiazem (CARDIZEM CD) 360 MG 24 hr capsule Take 1 capsule (360 mg total) by mouth daily.   No current facility-administered medications on file prior to visit.    Review of Systems Per HPI unless specifically indicated above     Objective:    BP 142/64 mmHg  Pulse 64  Temp(Src) 97.8 F (36.6 C) (Oral)  Wt 210 lb (95.255 kg)  Wt Readings from Last 3 Encounters:  02/20/15 210 lb (95.255 kg)  10/31/14 203 lb (92.08 kg)  10/17/14 201 lb 8 oz (91.4 kg)    Physical Exam  Constitutional: He is oriented to person, place, and time. He appears well-developed and well-nourished. No distress.  HENT:  Head: Normocephalic and atraumatic.  Right Ear: Hearing, tympanic membrane, external ear and ear canal normal.  Left Ear: Hearing, tympanic membrane, external ear and ear canal normal.  Nose: Nose  normal.  Mouth/Throat: Uvula is midline, oropharynx is clear and moist and mucous membranes are normal. No oropharyngeal exudate, posterior oropharyngeal edema or posterior oropharyngeal erythema.  Eyes: Conjunctivae and EOM are normal. Pupils are equal, round, and reactive to light. No scleral icterus.  Neck: Normal range of motion. Neck supple. Carotid bruit is not present. No thyromegaly present.  Cardiovascular: Normal rate, regular rhythm, normal heart sounds and intact distal  pulses.   No murmur heard. Pulses:      Radial pulses are 2+ on the right side, and 2+ on the left side.  Pulmonary/Chest: Effort normal and breath sounds normal. No respiratory distress. He has no wheezes. He has no rales.  Abdominal: Soft. Bowel sounds are normal. He exhibits no distension and no mass. There is no tenderness. There is no rebound and no guarding.  Genitourinary: Rectum normal. Rectal exam shows no external hemorrhoid, no internal hemorrhoid, no fissure, no mass, no tenderness and anal tone normal. Guaiac negative stool. Prostate is enlarged (40gm). Prostate is not tender.  Musculoskeletal: Normal range of motion. He exhibits no edema.  Lymphadenopathy:    He has no cervical adenopathy.  Neurological: He is alert and oriented to person, place, and time.  Chronic L hemiparesis. Recall 2/3, 3/3 with cue Calculation 5/5 serial 3s - great at math  Skin: Skin is warm and dry. No rash noted.  Psychiatric: He has a normal mood and affect. His behavior is normal. Judgment and thought content normal.  Nursing note and vitals reviewed.  Results for orders placed or performed in visit on 02/20/15  Hemoccult - 1 Card (office)  Result Value Ref Range   Fecal Occult Blood, POC Negative    Card #1 Date     Card #2 Fecal Occult Blod, POC     Card #2 Date     Card #3 Fecal Occult Blood, POC     Card #3 Date        Assessment & Plan:   Problem List Items Addressed This Visit    Renal insufficiency    Recheck Cr today.      Protein calorie malnutrition    Recheck today.      Medicare annual wellness visit, subsequent - Primary    I have personally reviewed the Medicare Annual Wellness questionnaire and have noted 1. The patient's medical and social history 2. Their use of alcohol, tobacco or illicit drugs 3. Their current medications and supplements 4. The patient's functional ability including ADL's, fall risks, home safety risks and hearing or visual impairment. 5. Diet  and physical activity 6. Evidence for depression or mood disorders The patients weight, height, BMI have been recorded in the chart.  Hearing and vision has been addressed. I have made referrals, counseling and provided education to the patient based review of the above and I have provided the pt with a written personalized care plan for preventive services. Provider list updated - see scanned questionairre. Reviewed preventative protocols and updated unless pt declined.       HLD (hyperlipidemia)    Continue caduet - check FLP today.      Relevant Orders   Lipid panel   Comprehensive metabolic panel   Hemiplegia, late effect of cerebrovascular disease    Persistent chronic L hemiparesis.      Essential hypertension    Chronic, stable. Ideal goal <140/90 given stroke history.      Diabetes mellitus type 2, controlled, with complications    Check A1c today.  Relevant Orders   Hemoglobin A1c   BPH (benign prostatic hypertrophy)    Enlarged prostate noted on exam today. Pt denies significant sxs but has started OTC prostate med.      Advanced care planning/counseling discussion    Advanced directives - would want sister, Lafonda MossesDiana to be HCPOA. Advanced packet provided today. Would like nurse to come out to house to review info with him.        Other Visit Diagnoses    Special screening for malignant neoplasms, colon        Relevant Orders    Hemoccult - 1 Card (office) (Completed)    Special screening for malignant neoplasm of prostate        Relevant Orders    PSA, Medicare        Follow up plan: Return in about 4 months (around 06/22/2015), or as needed, for follow up visit.

## 2015-02-21 ENCOUNTER — Encounter: Payer: Self-pay | Admitting: *Deleted

## 2015-02-21 ENCOUNTER — Telehealth: Payer: Self-pay | Admitting: Family Medicine

## 2015-02-21 NOTE — Telephone Encounter (Signed)
plz notify pt I checked into assistance with setting up advanced directive through [facility] [place] and found out he needs to call #[phone] and ask to have advanced care planning assistance. Let them know that he would have difficulty getting to their offices to see if they can come out to his house to help him with this (they should be able to do this). Contact person is Insurance underwriterhannon Pointer RN.

## 2015-02-21 NOTE — Telephone Encounter (Signed)
Spoke with patient. He said he couldn't write and hold the phone at the same time. He asked that I call back tomorrow when his aide will be there and can take the info.

## 2015-02-22 NOTE — Telephone Encounter (Signed)
Please call pt back. Aide is with him and will be there until 215pm

## 2015-02-22 NOTE — Telephone Encounter (Signed)
I didn't get his message in time. Will call back later.

## 2015-02-24 DIAGNOSIS — E119 Type 2 diabetes mellitus without complications: Secondary | ICD-10-CM | POA: Diagnosis not present

## 2015-02-24 DIAGNOSIS — H47233 Glaucomatous optic atrophy, bilateral: Secondary | ICD-10-CM | POA: Diagnosis not present

## 2015-02-24 DIAGNOSIS — H47323 Drusen of optic disc, bilateral: Secondary | ICD-10-CM | POA: Diagnosis not present

## 2015-02-24 LAB — HM DIABETES EYE EXAM

## 2015-02-27 NOTE — Telephone Encounter (Signed)
Spoke with patient's aide and info given.

## 2015-04-21 ENCOUNTER — Telehealth: Payer: Self-pay | Admitting: Family Medicine

## 2015-04-21 NOTE — Telephone Encounter (Signed)
Spoke with patient's son and instructed how to decrease insulin. Appt changed to next week with Dr. Reece AgarG and cancelled Saturday clinic appt.

## 2015-04-21 NOTE — Telephone Encounter (Signed)
[facility] Client TELEPHONE ADVICE RECORD [facility] Patient Name: Griffin DakinSAMUEL Simoneaux Gender: Male DOB: 12/21/1940 Age: [age] Y 2 M 29 D Return Phone Number: [phone]) [phone] (Primary), [phone] (Secondary) Address: City/State/Zip: [place] Client [facility] Day Physician Eustaquio BoydenGutierrez, Javier Contact Type Call Call Type Triage / Clinical Caller Name Cherlynn PoloShirlene Yarbro Relationship To Patient Other relative Appointment Disposition EMR Appointment Scheduled Info pasted into Epic Yes Return Phone Number [phone]) [phone] (Primary) Chief Complaint Blood Sugar Low Initial Comment Caller states her father in law has low blood sugar for last two weeks, xfer from office for triage Nurse Assessment Guidelines Guideline Title Affirmed Question Affirmed Notes Nurse Date/Time (Eastern Time) Disp. Time Lamount Cohen(Eastern Time) Disposition Final User After Care Instructions Given Call Event Type User Date / Time Description Comments User: Brent BullaAmy, North, RN Date/Time (Eastern Time): 04/21/2015 1:08:21 PM DAUGHTER IS CALLING IN STATING THAT HE HAS BEEN HAVING ISSUES WITH HIS BS LEVELS FOR THE PAST FEW WEEKS DROPPING LOW IN TH 70'S. SHE STATES THAT ITS BEEN THIS IN THE AFTERNOONS. SHE IS REQUESTING AN APPT WITH THE MD. SHE STATES SHE NEEDS IF FOR THIS WEEKEND IF POSSIBLE. APPT SCHEDULED FOR SATURDAY MORNING. SHE STATES THAT HE DOES NOT GET UP EARLY AND NEEDS AN APPT LATER IN THE MORNING. SCHEEDULED HIM AN APPT AT 12 NOON.

## 2015-04-21 NOTE — Telephone Encounter (Signed)
Patient Name: Marcus DakinSAMUEL Downs  DOB: 05/29/1941    Initial Comment Caller states her father in law has low blood sugar for last two weeks, xfer from office for triage   Nurse Assessment  Nurse: Sherilyn CooterHenry, RN, Thurmond ButtsWade Date/Time Lamount Cohen(Eastern Time): 04/21/2015 1:18:30 PM  Confirm and document reason for call. If symptomatic, describe symptoms. ---Caller states that her father-in-law's blood sugars have been running low for 2 weeks. She is not with him at present. She thinks the home health aid is with him now. 3 way call offered. She can be reached at [phone]. Zaquita is her name. The last blood sugar reading she had for him was 84 on Wednesday. 174 is the current reading. No longer has any dizziness or being light headed. He is able to stand and walk around.  Has the patient traveled out of the country within the last 30 days? ---No  Does the patient require triage? ---Yes  Related visit to physician within the last 2 weeks? ---No  Does the PT have any chronic conditions? (i.e. diabetes, asthma, etc.) ---Yes  List chronic conditions. ---Diabetes, HTN Hypercholesterolemia     Guidelines    Guideline Title Affirmed Question Affirmed Notes  Diabetes - High Blood Sugar Blood glucose 60-240 mg/dl (3.5 -13 mmol/l) (all triage questions negative)    Final Disposition User   Home Care Sherilyn CooterHenry, RN, Thurmond ButtsWade    Comments  DAUGHTER IS CALLING IN STATING THAT HE HAS BEEN HAVING ISSUES WITH HIS BS LEVELS FOR THE PAST FEW WEEKS DROPPING LOW IN TH 70'S. SHE STATES THAT ITS BEEN THIS IN THE AFTERNOONS. SHE IS REQUESTING AN APPT WITH THE MD. SHE STATES SHE NEEDS IF FOR THIS WEEKEND IF POSSIBLE. APPT SCHEDULED FOR SATURDAY MORNING. SHE STATES THAT HE DOES NOT GET UP EARLY AND NEEDS AN APPT LATER IN THE MORNING. SCHEEDULED HIM AN APPT AT 12 NOON.

## 2015-04-21 NOTE — Telephone Encounter (Signed)
[facility] at 12 noon with Dr Clent RidgesFry.

## 2015-04-21 NOTE — Telephone Encounter (Signed)
Lab Results  Component Value Date   HGBA1C 7.8* 02/20/2015   Would offer change in insulin dosing over phone and could see me next week.  If daughter comfortable with this, would recommend decrease novolog 70/30 to 22 units twice daily, then continue to monitor sugars. Bring in log of sugars next week to office visit. Make sure he's always taking 70/30 with a meal.

## 2015-04-22 ENCOUNTER — Ambulatory Visit: Payer: Self-pay | Admitting: Family Medicine

## 2015-04-26 ENCOUNTER — Ambulatory Visit (INDEPENDENT_AMBULATORY_CARE_PROVIDER_SITE_OTHER): Payer: Medicare Other | Admitting: Family Medicine

## 2015-04-26 ENCOUNTER — Encounter: Payer: Self-pay | Admitting: Family Medicine

## 2015-04-26 VITALS — BP 136/64 | HR 68 | Temp 98.0°F | Wt 215.0 lb

## 2015-04-26 DIAGNOSIS — E118 Type 2 diabetes mellitus with unspecified complications: Secondary | ICD-10-CM

## 2015-04-26 NOTE — Patient Instructions (Addendum)
Continue metformin and novolog 70/30 22 units twice daily with meals. Goal sugar before a meal is 80-120. Goal sugar 2 hours after a meal is 100-180, ok as long as <200.  Sugars 70-100 are ok as long as you're feeling ok.  True low sugars are <70, if this is happening let me know.  I think you're doing well.

## 2015-04-26 NOTE — Progress Notes (Addendum)
BP 136/64 mmHg  Pulse 68  Temp(Src) 98 F (36.7 C) (Oral)  Wt 215 lb (97.523 kg)   CC: discuss blood sugars  Subjective:    Patient ID: Marcus LankSamuel J Hettinger Jr., male    DOB: 06/28/1941, 74 y.o.   MRN: [id number]  HPI: Marcus LankSamuel J Meddings Jr. is a 74 y.o. male presenting on 04/26/2015 for Blood Sugar Problem   Presents with nurse aid Zaquita.  See recent phone note. Daughter concerned about pt's glycemic control - some low sugars but upon questioning lowest cbg 70s. We changed insulin regimen to novolog 70/30 22u bid and I asked him to bring log of sugars to today's visit. He is also on metformin 850mg  BID. Using syringe not pens for insulin. Pt hesitant to use pens. Friday after breakfast 174.   Feels ok when post prandial sugars are in the 70s. Denies ever having hypoglycemic sxs  Sugar log - did not keep log. Did not bring glucose meter today.   Relevant past medical, surgical, family and social history reviewed and updated as indicated. Interim medical history since our last visit reviewed. Allergies and medications reviewed and updated. Current Outpatient Prescriptions on File Prior to Visit  Medication Sig  . amLODipine-atorvastatin (CADUET) 10-40 MG per tablet Take 1 tablet by mouth daily.  Marland Kitchen. aspirin 325 MG EC tablet Take 325 mg by mouth daily. For stroke prevention    . diclofenac sodium (VOLTAREN) 1 % GEL Apply 1 application topically 3 (three) times daily as needed.  . diltiazem (TAZTIA XT) 360 MG 24 hr capsule Take 1 capsule (360 mg total) by mouth daily.  . insulin aspart protamine-insulin aspart (NOVOLOG 70/30) (70-30) 100 UNIT/ML injection Inject 22 Units into the skin 2 (two) times daily with a meal.   . latanoprost (XALATAN) 0.005 % ophthalmic solution Place 1 drop into both eyes at bedtime.  . Loperamide HCl (IMODIUM PO) Take 1 tablet by mouth as needed.   Marland Kitchen. losartan-hydrochlorothiazide (HYZAAR) 100-25 MG per tablet TAKE ONE TABLET BY MOUTH ONCE DAILY  . metFORMIN (GLUCOPHAGE)  850 MG tablet Take 1 tablet (850 mg total) by mouth 2 (two) times daily with a meal.  . potassium chloride (K-DUR) 10 MEQ tablet TAKE THREE TABLETS BY MOUTH EVERY DAY  . [DISCONTINUED] diltiazem (CARDIZEM CD) 360 MG 24 hr capsule Take 1 capsule (360 mg total) by mouth daily.   No current facility-administered medications on file prior to visit.    Review of Systems Per HPI unless specifically indicated above     Objective:    BP 136/64 mmHg  Pulse 68  Temp(Src) 98 F (36.7 C) (Oral)  Wt 215 lb (97.523 kg)  Wt Readings from Last 3 Encounters:  04/26/15 215 lb (97.523 kg)  02/20/15 210 lb (95.255 kg)  10/31/14 203 lb (92.08 kg)    Physical Exam  Constitutional: He appears well-developed and well-nourished. No distress.  Nursing note and vitals reviewed.  Results for orders placed or performed in visit on 02/20/15  Lipid panel  Result Value Ref Range   Cholesterol 114 0 - 200 mg/dL   Triglycerides 40.950.0 0.0 - 149.0 mg/dL   HDL 81.1951.90 >14.78>39.00 mg/dL   VLDL 29.510.0 0.0 - 62.140.0 mg/dL   LDL Cholesterol 52 0 - 99 mg/dL   Total CHOL/HDL Ratio 2    NonHDL 62.10   Comprehensive metabolic panel  Result Value Ref Range   Sodium 137 135 - 145 mEq/L   Potassium 3.8 3.5 - 5.1 mEq/L   Chloride  101 96 - 112 mEq/L   CO2 30 19 - 32 mEq/L   Glucose, Bld 76 70 - 99 mg/dL   BUN 15 6 - 23 mg/dL   Creatinine, Ser 5.62 0.40 - 1.50 mg/dL   Total Bilirubin 0.5 0.2 - 1.2 mg/dL   Alkaline Phosphatase 172 (H) 39 - 117 U/L   AST 29 0 - 37 U/L   ALT 24 0 - 53 U/L   Total Protein 7.9 6.0 - 8.3 g/dL   Albumin 3.9 3.5 - 5.2 g/dL   Calcium 9.7 8.4 - 13.0 mg/dL   GFR 86.57 >84.69 mL/min  Hemoglobin A1c  Result Value Ref Range   Hgb A1c MFr Bld 7.8 (H) 4.6 - 6.5 %  PSA, Medicare  Result Value Ref Range   PSA 1.99 0.10 - 4.00 ng/ml  Hemoccult - 1 Card (office)  Result Value Ref Range   Fecal Occult Blood, POC Negative    Card #1 Date     Card #2 Fecal Occult Blod, POC     Card #2 Date     Card #3  Fecal Occult Blood, POC     Card #3 Date        Assessment & Plan:   Problem List Items Addressed This Visit    Diabetes mellitus type 2, controlled, with complications - Primary    No true hypoglycemic episodes. Reviewed goal sugars pre and post prandial. Continue metformin  bid and lower dose of novolog 70/30 22u BID. Discussed always taking insulin shot with meal. Want to avoid hypoglycemia at all costs, so ok for sugars to run mildly elevated if this avoids hypoglycemia. Pt hesitant for changes - declines 70/30 pen, states doing well with son in law pre filling insulin syringes weekly.          Follow up plan: Return if symptoms worsen or fail to improve.

## 2015-04-26 NOTE — Assessment & Plan Note (Addendum)
No true hypoglycemic episodes. Reviewed goal sugars pre and post prandial. Continue metformin 850mg  bid and lower dose of novolog 70/30 22u BID. Discussed always taking insulin shot with meal. Want to avoid hypoglycemia at all costs, so ok for sugars to run mildly elevated if this avoids hypoglycemia. Pt hesitant for changes - declines 70/30 pen, states doing well with son in law pre filling insulin syringes weekly.

## 2015-04-26 NOTE — Progress Notes (Signed)
Pre visit review using our clinic review tool, if applicable. No additional management support is needed unless otherwise documented below in the visit note. 

## 2015-05-15 ENCOUNTER — Other Ambulatory Visit: Payer: Self-pay | Admitting: Family Medicine

## 2015-05-15 ENCOUNTER — Other Ambulatory Visit: Payer: Self-pay

## 2015-05-31 ENCOUNTER — Ambulatory Visit (INDEPENDENT_AMBULATORY_CARE_PROVIDER_SITE_OTHER): Payer: Medicare Other | Admitting: Podiatry

## 2015-05-31 ENCOUNTER — Encounter: Payer: Self-pay | Admitting: Podiatry

## 2015-05-31 DIAGNOSIS — B351 Tinea unguium: Secondary | ICD-10-CM

## 2015-05-31 DIAGNOSIS — M79676 Pain in unspecified toe(s): Secondary | ICD-10-CM

## 2015-05-31 LAB — HM DIABETES FOOT EXAM

## 2015-05-31 NOTE — Progress Notes (Signed)
Patient ID: Marcus LankSamuel J Lantz Jr., male   DOB: 03/23/1941, 74 y.o.   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

## 2015-05-31 NOTE — Patient Instructions (Signed)
Diabetes and Foot Care Diabetes may cause you to have problems because of poor blood supply (circulation) to your feet and legs. This may cause the skin on your feet to become thinner, break easier, and heal more slowly. Your skin may become dry, and the skin may peel and crack. You may also have nerve damage in your legs and feet causing decreased feeling in them. You may not notice minor injuries to your feet that could lead to infections or more serious problems. Taking care of your feet is one of the most important things you can do for yourself.  HOME CARE INSTRUCTIONS  Wear shoes at all times, even in the house. Do not go barefoot. Bare feet are easily injured.  Check your feet daily for blisters, cuts, and redness. If you cannot see the bottom of your feet, use a mirror or ask someone for help.  Wash your feet with warm water (do not use hot water) and mild soap. Then pat your feet and the areas between your toes until they are completely dry. Do not soak your feet as this can dry your skin.  Apply a moisturizing lotion or petroleum jelly (that does not contain alcohol and is unscented) to the skin on your feet and to dry, brittle toenails. Do not apply lotion between your toes.  Trim your toenails straight across. Do not dig under them or around the cuticle. File the edges of your nails with an emery board or nail file.  Do not cut corns or calluses or try to remove them with medicine.  Wear clean socks or stockings every day. Make sure they are not too tight. Do not wear knee-high stockings since they may decrease blood flow to your legs.  Wear shoes that fit properly and have enough cushioning. To break in new shoes, wear them for just a few hours a day. This prevents you from injuring your feet. Always look in your shoes before you put them on to be sure there are no objects inside.  Do not cross your legs. This may decrease the blood flow to your feet.  If you find a minor scrape,  cut, or break in the skin on your feet, keep it and the skin around it clean and dry. These areas may be cleansed with mild soap and water. Do not cleanse the area with peroxide, alcohol, or iodine.  When you remove an adhesive bandage, be sure not to damage the skin around it.  If you have a wound, look at it several times a day to make sure it is healing.  Do not use heating pads or hot water bottles. They may burn your skin. If you have lost feeling in your feet or legs, you may not know it is happening until it is too late.  Make sure your health care provider performs a complete foot exam at least annually or more often if you have foot problems. Report any cuts, sores, or bruises to your health care provider immediately. SEEK MEDICAL CARE IF:   You have an injury that is not healing.  You have cuts or breaks in the skin.  You have an ingrown nail.  You notice redness on your legs or feet.  You feel burning or tingling in your legs or feet.  You have pain or cramps in your legs and feet.  Your legs or feet are numb.  Your feet always feel cold. SEEK IMMEDIATE MEDICAL CARE IF:   There is increasing redness,   swelling, or pain in or around a wound.  There is a red line that goes up your leg.  Pus is coming from a wound.  You develop a fever or as directed by your health care provider.  You notice a bad smell coming from an ulcer or wound. Document Released: 11/01/2000 Document Revised: 07/07/2013 Document Reviewed: 04/13/2013 [employer] Patient Information 2015 [employer]. This information is not intended to replace advice given to you by your health care provider. Make sure you discuss any questions you have with your health care provider.  

## 2015-06-19 ENCOUNTER — Other Ambulatory Visit: Payer: Self-pay | Admitting: Family Medicine

## 2015-07-04 ENCOUNTER — Encounter: Payer: Self-pay | Admitting: Family Medicine

## 2015-07-04 DIAGNOSIS — H401190 Primary open-angle glaucoma, unspecified eye, stage unspecified: Secondary | ICD-10-CM | POA: Insufficient documentation

## 2015-07-10 LAB — HM DIABETES EYE EXAM

## 2015-07-18 ENCOUNTER — Encounter: Payer: Self-pay | Admitting: Family Medicine

## 2015-07-31 ENCOUNTER — Encounter: Payer: Self-pay | Admitting: Family Medicine

## 2015-08-21 ENCOUNTER — Encounter: Payer: Self-pay | Admitting: Family Medicine

## 2015-08-21 ENCOUNTER — Ambulatory Visit (INDEPENDENT_AMBULATORY_CARE_PROVIDER_SITE_OTHER): Payer: Medicare Other | Admitting: Family Medicine

## 2015-08-21 VITALS — BP 150/86 | HR 80 | Temp 98.1°F | Wt 208.5 lb

## 2015-08-21 DIAGNOSIS — E118 Type 2 diabetes mellitus with unspecified complications: Secondary | ICD-10-CM | POA: Diagnosis not present

## 2015-08-21 DIAGNOSIS — I10 Essential (primary) hypertension: Secondary | ICD-10-CM

## 2015-08-21 DIAGNOSIS — Z794 Long term (current) use of insulin: Secondary | ICD-10-CM

## 2015-08-21 DIAGNOSIS — Z23 Encounter for immunization: Secondary | ICD-10-CM

## 2015-08-21 DIAGNOSIS — I69959 Hemiplegia and hemiparesis following unspecified cerebrovascular disease affecting unspecified side: Secondary | ICD-10-CM | POA: Diagnosis not present

## 2015-08-21 DIAGNOSIS — N289 Disorder of kidney and ureter, unspecified: Secondary | ICD-10-CM | POA: Diagnosis not present

## 2015-08-21 LAB — BASIC METABOLIC PANEL
BUN: 14 mg/dL (ref 6–23)
CHLORIDE: 101 meq/L (ref 96–112)
CO2: 28 meq/L (ref 19–32)
Calcium: 9.6 mg/dL (ref 8.4–10.5)
Creatinine, Ser: 1.09 mg/dL (ref 0.40–1.50)
GFR: 85.14 mL/min (ref >60.00)
GLUCOSE: 181 mg/dL — AB (ref 70–99)
POTASSIUM: 4.1 meq/L (ref 3.5–5.1)
SODIUM: 138 meq/L (ref 135–145)

## 2015-08-21 LAB — HEMOGLOBIN A1C: Hgb A1c MFr Bld: 7.3 % — ABNORMAL HIGH (ref 4.6–6.5)

## 2015-08-21 MED ORDER — DICLOFENAC SODIUM 1 % TD GEL: 1.0000 "application " | Freq: Three times a day (TID) | TRANSDERMAL | Status: DC | PRN

## 2015-08-21 NOTE — Assessment & Plan Note (Addendum)
Residual L hemiplegia. Continue to monitor. Has care aid (Zaquita) at house several hours a day.

## 2015-08-21 NOTE — Progress Notes (Signed)
BP 150/86 mmHg  Pulse 80  Temp(Src) 98.1 F (36.7 C) (Oral)  Wt 208 lb 8 oz (94.575 kg)   CC: f/u visit  Subjective:    Patient ID: Marcus Lank., male    DOB: [birth year], 74 y.o.   MRN: [id number]  HPI: Marcus Bracewell. is a 74 y.o. male presenting on 08/21/2015 for Follow-up   Presents with nurse aid Zaquita.   HTN - compliant with hyzaar 100/25mg  daily, diltiazem  daily, and amlodipine  daily.  DM - regularly does check sugars 2 hours after breakfast 70-100s, one high reading 300s. Compliant with antihyperglycemic regimen which includes: metformin  bid, novolog 70/30 22u BID (uses syringe, son fills this for him, usually eats at 11am and 3pm). Pt hesitant for any changes.  Denies low sugars or hypoglycemic symptoms. Denies paresthesias. Denies chest pain, headaches, shortness of breath. Last diabetic eye exam 06/2015.  Pneumovax: 2011.  Prevnar: 2015. Lab Results  Component Value Date   HGBA1C 7.8* 02/20/2015   Diabetic Foot Exam - Simple   No data filed        Relevant past medical, surgical, family and social history reviewed and updated as indicated. Interim medical history since our last visit reviewed. Allergies and medications reviewed and updated. Current Outpatient Prescriptions on File Prior to Visit  Medication Sig  . amLODipine-atorvastatin (CADUET) 10-40 MG per tablet TAKE ONE TABLET BY MOUTH ONCE DAILY  . aspirin 325 MG EC tablet Take 325 mg by mouth daily. For stroke prevention    . diltiazem (TAZTIA XT) 360 MG 24 hr capsule Take 1 capsule (360 mg total) by mouth daily.  . insulin aspart protamine-insulin aspart (NOVOLOG 70/30) (70-30) 100 UNIT/ML injection Inject 22 Units into the skin 2 (two) times daily with a meal.   . latanoprost (XALATAN) 0.005 % ophthalmic solution Place 1 drop into both eyes at bedtime.  . Loperamide HCl (IMODIUM PO) Take 1 tablet by mouth as needed.   Marland Kitchen losartan-hydrochlorothiazide (HYZAAR) 100-25 MG per tablet TAKE  ONE TABLET BY MOUTH ONCE DAILY  . metFORMIN (GLUCOPHAGE) 850 MG tablet TAKE ONE TABLET BY MOUTH TWICE DAILY WITH MEALS  . potassium chloride (K-DUR) 10 MEQ tablet TAKE THREE TABLETS BY MOUTH ONCE DAILY  . [DISCONTINUED] diltiazem (CARDIZEM CD) 360 MG 24 hr capsule Take 1 capsule (360 mg total) by mouth daily.   No current facility-administered medications on file prior to visit.    Review of Systems Per HPI unless specifically indicated above     Objective:    BP 150/86 mmHg  Pulse 80  Temp(Src) 98.1 F (36.7 C) (Oral)  Wt 208 lb 8 oz (94.575 kg)  Wt Readings from Last 3 Encounters:  08/21/15 208 lb 8 oz (94.575 kg)  04/26/15 215 lb (97.523 kg)  02/20/15 210 lb (95.255 kg)    Physical Exam  Constitutional: He appears well-developed and well-nourished. No distress.  HENT:  Head: Normocephalic and atraumatic.  Right Ear: External ear normal.  Left Ear: External ear normal.  Nose: Nose normal.  Mouth/Throat: Oropharynx is clear and moist. No oropharyngeal exudate.  Eyes: Conjunctivae and EOM are normal. Pupils are equal, round, and reactive to light. No scleral icterus.  Neck: Normal range of motion. Neck supple.  Cardiovascular: Normal rate, regular rhythm, normal heart sounds and intact distal pulses.   No murmur heard. Pulmonary/Chest: Effort normal and breath sounds normal. No respiratory distress. He has no wheezes. He has no rales.  Musculoskeletal: He exhibits edema (2+  L, 1+ R).  See HPI for foot exam if done  Lymphadenopathy:    He has no cervical adenopathy.  Neurological:  In wheelchair L residual hemiparesis. L leg in brace, L arm in sling  Skin: Skin is warm and dry. No rash noted.  Psychiatric: He has a normal mood and affect.  Nursing note and vitals reviewed.  Results for orders placed or performed in visit on 08/21/15  HM DIABETES FOOT EXAM  Result Value Ref Range   HM Diabetic Foot Exam podiatrist       Assessment & Plan:   Problem List Items  Addressed This Visit    Renal insufficiency    Overall stable. Recheck Cr today.      Hemiplegia, late effect of cerebrovascular disease (HCC)    Residual L hemiplegia. Continue to monitor. Has care aid (Zaquita) at house several hours a day.      Essential hypertension    Chronic, again elevated. On 4 drug regimen - continue current treatment plan. Tight bp control ideal <140/90 given stroke history.       Diabetes mellitus type 2, controlled, with complications (HCC) - Primary    Again reviewed normal sugar levels, discussed 70s ok as long as no low sugar symptoms. Pt states he feels well. Continue current treatment regimen.      Relevant Orders   Basic metabolic panel   Hemoglobin A1c    Other Visit Diagnoses    Need for influenza vaccination        Relevant Orders    Flu Vaccine QUAD 36+ mos PF IM (Fluarix & Fluzone Quad PF)        Follow up plan: Return in about 4 months (around 12/22/2015), or as needed, for follow up visit.

## 2015-08-21 NOTE — Assessment & Plan Note (Signed)
Chronic, again elevated. On 4 drug regimen - continue current treatment plan. Tight bp control ideal <140/90 given stroke history.

## 2015-08-21 NOTE — Assessment & Plan Note (Signed)
Overall stable. Recheck Cr today.

## 2015-08-21 NOTE — Assessment & Plan Note (Signed)
Again reviewed normal sugar levels, discussed 70s ok as long as no low sugar symptoms. Pt states he feels well. Continue current treatment regimen.

## 2015-08-21 NOTE — Progress Notes (Signed)
Pre visit review using our clinic review tool, if applicable. No additional management support is needed unless otherwise documented below in the visit note. 

## 2015-08-21 NOTE — Patient Instructions (Addendum)
Flu shot today. labwork today. Let me know if sugars running below 70.  80-120 for a fasting reading is ok - as long as you don't feel bad. You are doing well today -return as needed or in 4 months for follow up visit.

## 2015-08-22 ENCOUNTER — Ambulatory Visit: Payer: Medicare Other | Admitting: Family Medicine

## 2015-08-23 ENCOUNTER — Encounter: Payer: Self-pay | Admitting: *Deleted

## 2015-09-12 DIAGNOSIS — H35033 Hypertensive retinopathy, bilateral: Secondary | ICD-10-CM | POA: Diagnosis not present

## 2015-09-12 DIAGNOSIS — I639 Cerebral infarction, unspecified: Secondary | ICD-10-CM | POA: Diagnosis not present

## 2015-09-12 DIAGNOSIS — H47233 Glaucomatous optic atrophy, bilateral: Secondary | ICD-10-CM | POA: Diagnosis not present

## 2015-09-12 DIAGNOSIS — H26499 Other secondary cataract, unspecified eye: Secondary | ICD-10-CM | POA: Diagnosis not present

## 2015-09-13 ENCOUNTER — Encounter: Payer: Self-pay | Admitting: Podiatry

## 2015-09-13 ENCOUNTER — Ambulatory Visit (INDEPENDENT_AMBULATORY_CARE_PROVIDER_SITE_OTHER): Payer: Medicare Other | Admitting: Podiatry

## 2015-09-13 DIAGNOSIS — B351 Tinea unguium: Secondary | ICD-10-CM

## 2015-09-13 DIAGNOSIS — M79676 Pain in unspecified toe(s): Secondary | ICD-10-CM

## 2015-09-13 NOTE — Progress Notes (Signed)
Patient ID: Marcus Downs., male   DOB: 08/25/1941, 74 y.o.   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

## 2015-09-13 NOTE — Patient Instructions (Signed)
Diabetes and Foot Care Diabetes may cause you to have problems because of poor blood supply (circulation) to your feet and legs. This may cause the skin on your feet to become thinner, break easier, and heal more slowly. Your skin may become dry, and the skin may peel and crack. You may also have nerve damage in your legs and feet causing decreased feeling in them. You may not notice minor injuries to your feet that could lead to infections or more serious problems. Taking care of your feet is one of the most important things you can do for yourself.  HOME CARE INSTRUCTIONS  Wear shoes at all times, even in the house. Do not go barefoot. Bare feet are easily injured.  Check your feet daily for blisters, cuts, and redness. If you cannot see the bottom of your feet, use a mirror or ask someone for help.  Wash your feet with warm water (do not use hot water) and mild soap. Then pat your feet and the areas between your toes until they are completely dry. Do not soak your feet as this can dry your skin.  Apply a moisturizing lotion or petroleum jelly (that does not contain alcohol and is unscented) to the skin on your feet and to dry, brittle toenails. Do not apply lotion between your toes.  Trim your toenails straight across. Do not dig under them or around the cuticle. File the edges of your nails with an emery board or nail file.  Do not cut corns or calluses or try to remove them with medicine.  Wear clean socks or stockings every day. Make sure they are not too tight. Do not wear knee-high stockings since they may decrease blood flow to your legs.  Wear shoes that fit properly and have enough cushioning. To break in new shoes, wear them for just a few hours a day. This prevents you from injuring your feet. Always look in your shoes before you put them on to be sure there are no objects inside.  Do not cross your legs. This may decrease the blood flow to your feet.  If you find a minor scrape,  cut, or break in the skin on your feet, keep it and the skin around it clean and dry. These areas may be cleansed with mild soap and water. Do not cleanse the area with peroxide, alcohol, or iodine.  When you remove an adhesive bandage, be sure not to damage the skin around it.  If you have a wound, look at it several times a day to make sure it is healing.  Do not use heating pads or hot water bottles. They may burn your skin. If you have lost feeling in your feet or legs, you may not know it is happening until it is too late.  Make sure your health care provider performs a complete foot exam at least annually or more often if you have foot problems. Report any cuts, sores, or bruises to your health care provider immediately. SEEK MEDICAL CARE IF:   You have an injury that is not healing.  You have cuts or breaks in the skin.  You have an ingrown nail.  You notice redness on your legs or feet.  You feel burning or tingling in your legs or feet.  You have pain or cramps in your legs and feet.  Your legs or feet are numb.  Your feet always feel cold. SEEK IMMEDIATE MEDICAL CARE IF:   There is increasing redness,   swelling, or pain in or around a wound.  There is a red line that goes up your leg.  Pus is coming from a wound.  You develop a fever or as directed by your health care provider.  You notice a bad smell coming from an ulcer or wound.   This information is not intended to replace advice given to you by your health care provider. Make sure you discuss any questions you have with your health care provider.   Document Released: 11/01/2000 Document Revised: 07/07/2013 Document Reviewed: 04/13/2013 Elsevier Interactive Patient Education 2016 [employer]  

## 2015-09-18 ENCOUNTER — Other Ambulatory Visit: Payer: Self-pay | Admitting: Family Medicine

## 2015-09-20 ENCOUNTER — Telehealth: Payer: Self-pay | Admitting: Family Medicine

## 2015-09-20 NOTE — Telephone Encounter (Signed)
Jonathan clark with [facility] called -  He needs a rx from Dr Reece AgarG in order to get a discount on the taxes cb - [phone] rx can be faxed [phone]

## 2015-09-25 NOTE — Telephone Encounter (Signed)
Rx faxed

## 2015-09-25 NOTE — Telephone Encounter (Signed)
Rx in [facility]. 

## 2015-10-16 ENCOUNTER — Other Ambulatory Visit: Payer: Self-pay | Admitting: Family Medicine

## 2015-12-05 ENCOUNTER — Other Ambulatory Visit: Payer: Self-pay | Admitting: *Deleted

## 2015-12-05 MED ORDER — DILTIAZEM HCL ER BEADS 360 MG PO CP24: 360.0000 mg | ORAL_CAPSULE | Freq: Every day | ORAL | Status: DC

## 2015-12-13 ENCOUNTER — Ambulatory Visit: Payer: Medicare Other | Admitting: Podiatry

## 2015-12-18 ENCOUNTER — Ambulatory Visit (INDEPENDENT_AMBULATORY_CARE_PROVIDER_SITE_OTHER): Payer: Medicare Other | Admitting: Family Medicine

## 2015-12-18 ENCOUNTER — Other Ambulatory Visit: Payer: Self-pay | Admitting: Family Medicine

## 2015-12-18 ENCOUNTER — Encounter: Payer: Self-pay | Admitting: Family Medicine

## 2015-12-18 VITALS — BP 154/58 | HR 81 | Wt 207.0 lb

## 2015-12-18 DIAGNOSIS — I10 Essential (primary) hypertension: Secondary | ICD-10-CM

## 2015-12-18 DIAGNOSIS — E118 Type 2 diabetes mellitus with unspecified complications: Secondary | ICD-10-CM | POA: Diagnosis not present

## 2015-12-18 DIAGNOSIS — I69959 Hemiplegia and hemiparesis following unspecified cerebrovascular disease affecting unspecified side: Secondary | ICD-10-CM

## 2015-12-18 DIAGNOSIS — Z794 Long term (current) use of insulin: Secondary | ICD-10-CM | POA: Diagnosis not present

## 2015-12-18 NOTE — Progress Notes (Signed)
BP 154/58 mmHg  Pulse 81  Wt 207 lb (93.895 kg)  SpO2 96%   CC: 4 mo f/u visit  Subjective:    Patient ID: Marcus Lank., male    DOB: 06-15-1941, 75 y.o.   MRN: [id number]  HPI: Marcus Noy. is a 75 y.o. male presenting on 12/18/2015 for Follow-up   Presents with nurse aid Zaquita  HTN - compliant with hyzaar 100/25mg  daily, diltiazem  daily, and amlodipine  daily. No HA, vision changes, CP/tightness, SOB, leg swelling. bp staying elevated.   DM - regularly does check sugars 2 hours after breakfast 70-100s, one high reading 300s. Compliant with antihyperglycemic regimen which includes: metformin  bid, novolog 70/30 22u BID (uses syringe, family fills this for him, usually eats at 10am and 3pm). Pt hesitant for any changes. Denies low sugars or hypoglycemic symptoms. Denies paresthesias. Last diabetic eye exam 06/2015. Pneumovax: 2011. Prevnar: 2015. Lab Results  Component Value Date   HGBA1C 7.3* 08/21/2015    Diabetic Foot Exam - Simple   No data filed       Relevant past medical, surgical, family and social history reviewed and updated as indicated. Interim medical history since our last visit reviewed. Allergies and medications reviewed and updated. Current Outpatient Prescriptions on File Prior to Visit  Medication Sig  . amLODipine-atorvastatin (CADUET) 10-40 MG per tablet TAKE ONE TABLET BY MOUTH ONCE DAILY  . aspirin 325 MG EC tablet Take 325 mg by mouth daily. For stroke prevention    . diclofenac sodium (VOLTAREN) 1 % GEL Apply 1 application topically 3 (three) times daily as needed.  . diltiazem (TAZTIA XT) 360 MG 24 hr capsule Take 1 capsule (360 mg total) by mouth daily.  . insulin aspart protamine-insulin aspart (NOVOLOG 70/30) (70-30) 100 UNIT/ML injection Inject 22 Units into the skin 2 (two) times daily with a meal.   . latanoprost (XALATAN) 0.005 % ophthalmic solution Place 1 drop into both eyes at bedtime.  . Loperamide HCl (IMODIUM  PO) Take 1 tablet by mouth as needed.   Marland Kitchen losartan-hydrochlorothiazide (HYZAAR) 100-25 MG tablet TAKE ONE TABLET BY MOUTH ONCE DAILY  . metFORMIN (GLUCOPHAGE) 850 MG tablet TAKE ONE TABLET BY MOUTH TWICE DAILY WITH MEALS  . potassium chloride (K-DUR) 10 MEQ tablet TAKE THREE TABLETS BY MOUTH ONCE DAILY  . [DISCONTINUED] diltiazem (CARDIZEM CD) 360 MG 24 hr capsule Take 1 capsule (360 mg total) by mouth daily.   No current facility-administered medications on file prior to visit.    Review of Systems Per HPI unless specifically indicated in ROS section     Objective:    BP 154/58 mmHg  Pulse 81  Wt 207 lb (93.895 kg)  SpO2 96%  Wt Readings from Last 3 Encounters:  12/18/15 207 lb (93.895 kg)  08/21/15 208 lb 8 oz (94.575 kg)  04/26/15 215 lb (97.523 kg)    Physical Exam  Constitutional: He appears well-developed and well-nourished. No distress.  In wheelchair  HENT:  Mouth/Throat: Oropharynx is clear and moist. No oropharyngeal exudate.  Cardiovascular: Normal rate, regular rhythm, normal heart sounds and intact distal pulses.   No murmur heard. Pulmonary/Chest: Effort normal and breath sounds normal. No respiratory distress. He has no wheezes. He has no rales.  Musculoskeletal: He exhibits no edema.  Mild pedal edema  Neurological:  Residual L hemiplegia  Skin: Skin is warm and dry. No rash noted.  Psychiatric: He has a normal mood and affect.  Nursing note and vitals  reviewed.  Results for orders placed or performed in visit on 08/21/15  Basic metabolic panel  Result Value Ref Range   Sodium 138 135 - 145 mEq/L   Potassium 4.1 3.5 - 5.1 mEq/L   Chloride 101 96 - 112 mEq/L   CO2 28 19 - 32 mEq/L   Glucose, Bld 181 (H) 70 - 99 mg/dL   BUN 14 6 - 23 mg/dL   Creatinine, Ser 8.29 0.40 - 1.50 mg/dL   Calcium 9.6 8.4 - 56.2 mg/dL   GFR 13.08 >65.78 mL/min  Hemoglobin A1c  Result Value Ref Range   Hgb A1c MFr Bld 7.3 (H) 4.6 - 6.5 %  HM DIABETES FOOT EXAM  Result  Value Ref Range   HM Diabetic Foot Exam podiatrist       Assessment & Plan:   Problem List Items Addressed This Visit    Hemiplegia, late effect of cerebrovascular disease (HCC)   Essential hypertension    Mildly above goal - continue current regimen. Offered PRN lasix, pt declines. Recheck next visit, if staying elevated, consider stronger ARB/HCTZ combo.      Diabetes mellitus type 2, controlled, with complications (HCC) - Primary    Chronic, stable. Continue current regimen. Denies hypoglycemic sxs. Check labs next visit          Follow up plan: Return in about 4 months (around 04/16/2016), or as needed, for medicare wellness visit.

## 2015-12-18 NOTE — Assessment & Plan Note (Signed)
Chronic, stable. Continue current regimen. Denies hypoglycemic sxs. Check labs next visit

## 2015-12-18 NOTE — Patient Instructions (Addendum)
Return in 4 months for medicare wellness visit Continue current medicines as up to now. You are doing well today.

## 2015-12-18 NOTE — Assessment & Plan Note (Signed)
Mildly above goal - continue current regimen. Offered PRN lasix, pt declines. Recheck next visit, if staying elevated, consider stronger ARB/HCTZ combo.

## 2016-01-08 ENCOUNTER — Other Ambulatory Visit: Payer: Self-pay | Admitting: Family Medicine

## 2016-03-06 DIAGNOSIS — H47233 Glaucomatous optic atrophy, bilateral: Secondary | ICD-10-CM | POA: Diagnosis not present

## 2016-03-06 DIAGNOSIS — I639 Cerebral infarction, unspecified: Secondary | ICD-10-CM | POA: Diagnosis not present

## 2016-03-06 DIAGNOSIS — H26499 Other secondary cataract, unspecified eye: Secondary | ICD-10-CM | POA: Diagnosis not present

## 2016-03-06 DIAGNOSIS — E119 Type 2 diabetes mellitus without complications: Secondary | ICD-10-CM | POA: Diagnosis not present

## 2016-03-18 ENCOUNTER — Other Ambulatory Visit: Payer: Self-pay | Admitting: Family Medicine

## 2016-04-16 ENCOUNTER — Encounter: Payer: Medicare Other | Admitting: Family Medicine

## 2016-04-17 ENCOUNTER — Encounter: Payer: Self-pay | Admitting: Family Medicine

## 2016-04-17 ENCOUNTER — Ambulatory Visit (INDEPENDENT_AMBULATORY_CARE_PROVIDER_SITE_OTHER): Payer: Medicare Other

## 2016-04-17 ENCOUNTER — Ambulatory Visit (INDEPENDENT_AMBULATORY_CARE_PROVIDER_SITE_OTHER): Payer: Medicare Other | Admitting: Family Medicine

## 2016-04-17 VITALS — BP 142/68 | HR 80 | Temp 98.6°F | Ht 70.75 in | Wt 207.5 lb

## 2016-04-17 DIAGNOSIS — Z794 Long term (current) use of insulin: Secondary | ICD-10-CM

## 2016-04-17 DIAGNOSIS — E785 Hyperlipidemia, unspecified: Secondary | ICD-10-CM

## 2016-04-17 DIAGNOSIS — N289 Disorder of kidney and ureter, unspecified: Secondary | ICD-10-CM | POA: Diagnosis not present

## 2016-04-17 DIAGNOSIS — E118 Type 2 diabetes mellitus with unspecified complications: Secondary | ICD-10-CM

## 2016-04-17 DIAGNOSIS — Z0000 Encounter for general adult medical examination without abnormal findings: Secondary | ICD-10-CM | POA: Diagnosis not present

## 2016-04-17 DIAGNOSIS — B351 Tinea unguium: Secondary | ICD-10-CM

## 2016-04-17 DIAGNOSIS — I10 Essential (primary) hypertension: Secondary | ICD-10-CM | POA: Diagnosis not present

## 2016-04-17 DIAGNOSIS — I69959 Hemiplegia and hemiparesis following unspecified cerebrovascular disease affecting unspecified side: Secondary | ICD-10-CM

## 2016-04-17 LAB — RENAL FUNCTION PANEL
Albumin: 4.1 g/dL (ref 3.5–5.2)
BUN: 15 mg/dL (ref 6–23)
CHLORIDE: 101 meq/L (ref 96–112)
CO2: 28 mEq/L (ref 19–32)
Calcium: 9.5 mg/dL (ref 8.4–10.5)
Creatinine, Ser: 1.09 mg/dL (ref 0.40–1.50)
GFR: 84.99 mL/min (ref >60.00)
Glucose, Bld: 123 mg/dL — ABNORMAL HIGH (ref 70–99)
POTASSIUM: 3.7 meq/L (ref 3.5–5.1)
Phosphorus: 3.1 mg/dL (ref 2.3–4.6)
SODIUM: 137 meq/L (ref 135–145)

## 2016-04-17 LAB — LIPID PANEL
CHOL/HDL RATIO: 2
CHOLESTEROL: 107 mg/dL (ref 0–200)
HDL: 43.5 mg/dL (ref >39.00)
LDL Cholesterol: 54 mg/dL (ref 0–99)
NonHDL: 63.78
TRIGLYCERIDES: 50 mg/dL (ref 0.0–149.0)
VLDL: 10 mg/dL (ref 0.0–40.0)

## 2016-04-17 LAB — HEMOGLOBIN A1C: Hgb A1c MFr Bld: 7.6 % — ABNORMAL HIGH (ref 4.6–6.5)

## 2016-04-17 NOTE — Assessment & Plan Note (Signed)
-   Recheck Cr today

## 2016-04-17 NOTE — Progress Notes (Signed)
I reviewed health advisor's note, was available for consultation, and agree with documentation and plan.  

## 2016-04-17 NOTE — Assessment & Plan Note (Signed)
Continue caduet, check FLP today.

## 2016-04-17 NOTE — Assessment & Plan Note (Signed)
Declines podiatry referral at this time. Foot  exam today.

## 2016-04-17 NOTE — Progress Notes (Signed)
BP 142/68 mmHg  Pulse 80  Temp(Src) 98.6 F (37 C) (Oral)  Ht 5' 10.75" (1.797 m)  Wt 207 lb 8 oz (94.121 kg)  BMI 29.15 kg/m2  SpO2 98%   CC: f/u visit after AMW today.  Subjective:    Patient ID: Marcus LankSamuel J Quaranta Jr., male    DOB: 01/29/1941, [age] y.o.   MRN: [id number]  HPI: Marcus LankSamuel J Rozenberg Jr. is a 75 y.o. male presenting on 04/17/2016 for Follow-up   Saw Lesia earlier today for AMW.  Presents with nurse aide Zaquita.   Failed hearing screen. Pt denies trouble with hearing.  Eye exam 02/2016  HTN - compliant with hyzaar 100/25mg  daily, diltiazem 360mg  daily, and amlodipine 10mg  daily (in caduet). No concerns with affording meds. No HA, vision changes, CP/tightness, SOB, leg swelling. BP staying elevated.   DM - regularly does check sugars once daily 2 hours after breakfast MWF 160-207. Compliant with antihyperglycemic regimen which includes: metformin 850mg  bid, novolog 70/30 22u BID (uses syringe, family fills this for him, usually eats at 10am and 3pm). Noticing diarrhea to metformin. Denies low sugars or hypoglycemic symptoms. Denies paresthesias. Last diabetic eye exam 02/2016. Pneumovax: 2011. Prevnar: 2015. Prior saw podiatrist, latest 08/2015. Declines return at this time. Lab Results  Component Value Date   HGBA1C 7.3* 08/21/2015    Diabetic Foot Exam - Simple   Simple Foot Form  Diabetic Foot exam was performed with the following findings:  Yes 04/17/2016 12:13 PM  Visual Inspection  See comments:  Yes  Sensation Testing  Intact to touch and monofilament testing bilaterally:  Yes  Pulse Check  See comments:  Yes  Comments  Diminished DP bilaterally Edema of feet, toenail thickening and onychomycosis, but no tears of skin       Relevant past medical, surgical, family and social history reviewed and updated as indicated. Interim medical history since our last visit reviewed. Allergies and medications reviewed and updated. Current Outpatient Prescriptions on File  Prior to Visit  Medication Sig  . amLODipine-atorvastatin (CADUET) 10-40 MG tablet TAKE ONE TABLET BY MOUTH ONCE DAILY  . aspirin 325 MG EC tablet Take 325 mg by mouth daily. For stroke prevention    . diclofenac sodium (VOLTAREN) 1 % GEL Apply 1 application topically 3 (three) times daily as needed.  . diltiazem (TAZTIA XT) 360 MG 24 hr capsule Take 1 capsule (360 mg total) by mouth daily.  . insulin aspart protamine-insulin aspart (NOVOLOG 70/30) (70-30) 100 UNIT/ML injection Inject 22 Units into the skin 2 (two) times daily with a meal.   . latanoprost (XALATAN) 0.005 % ophthalmic solution Place 1 drop into both eyes at bedtime.  . Loperamide HCl (IMODIUM PO) Take 1 tablet by mouth as needed.   Marland Kitchen. losartan-hydrochlorothiazide (HYZAAR) 100-25 MG tablet TAKE ONE TABLET BY MOUTH ONCE DAILY  . metFORMIN (GLUCOPHAGE) 850 MG tablet TAKE ONE TABLET BY MOUTH TWICE DAILY WITH MEALS  . potassium chloride (K-DUR) 10 MEQ tablet TAKE THREE TABLETS BY MOUTH ONCE DAILY  . [DISCONTINUED] diltiazem (CARDIZEM CD) 360 MG 24 hr capsule Take 1 capsule (360 mg total) by mouth daily.   No current facility-administered medications on file prior to visit.    Review of Systems Per HPI unless specifically indicated in ROS section     Objective:    BP 142/68 mmHg  Pulse 80  Temp(Src) 98.6 F (37 C) (Oral)  Ht 5' 10.75" (1.797 m)  Wt 207 lb 8 oz (94.121 kg)  BMI 29.15  kg/m2  SpO2 98%  Wt Readings from Last 3 Encounters:  04/17/16 207 lb 8 oz (94.121 kg)  04/17/16 207 lb 8 oz (94.121 kg)  12/18/15 207 lb (93.895 kg)    Physical Exam  Constitutional: He appears well-developed and well-nourished. No distress.  HENT:  Head: Normocephalic and atraumatic.  Right Ear: External ear normal.  Left Ear: External ear normal.  Nose: Nose normal.  Mouth/Throat: Oropharynx is clear and moist. No oropharyngeal exudate.  Eyes: Conjunctivae and EOM are normal. Pupils are equal, round, and reactive to light. No  scleral icterus.  Neck: Normal range of motion. Neck supple.  Cardiovascular: Normal rate, regular rhythm, normal heart sounds and intact distal pulses.   No murmur heard. Pulmonary/Chest: Effort normal and breath sounds normal. No respiratory distress. He has no wheezes. He has no rales.  Musculoskeletal: He exhibits no edema.  See HPI for foot exam if done  Lymphadenopathy:    He has no cervical adenopathy.  Neurological:  Residual L hemiparesis  Skin: Skin is warm and dry. No rash noted.  Psychiatric: He has a normal mood and affect.  Nursing note and vitals reviewed.  Results for orders placed or performed in visit on 08/21/15  Basic metabolic panel  Result Value Ref Range   Sodium 138 135 - 145 mEq/L   Potassium 4.1 3.5 - 5.1 mEq/L   Chloride 101 96 - 112 mEq/L   CO2 28 19 - 32 mEq/L   Glucose, Bld 181 (H) 70 - 99 mg/dL   BUN 14 6 - 23 mg/dL   Creatinine, Ser 5.78 0.40 - 1.50 mg/dL   Calcium 9.6 8.4 - 46.9 mg/dL   GFR 62.95 >28.41 mL/min  Hemoglobin A1c  Result Value Ref Range   Hgb A1c MFr Bld 7.3 (H) 4.6 - 6.5 %  HM DIABETES FOOT EXAM  Result Value Ref Range   HM Diabetic Foot Exam podiatrist       Assessment & Plan:  Patient fasting today Problem List Items Addressed This Visit    Diabetes mellitus type 2, controlled, with complications (HCC) - Primary    Chronic, overall adequate control for age and comorbidities. Want to avoid hypoglycemia, pt cannot check sugars at home 2/2 disability, nurse aide checks 3x/wk. Check A1c today.       Relevant Orders   Hemoglobin A1c   Renal function panel   HLD (hyperlipidemia)    Continue caduet, check FLP today.      Relevant Orders   Lipid panel   Essential hypertension    Chronic, stable. Continue current regimen.      Renal insufficiency    Recheck Cr today.      Relevant Orders   Renal function panel   Onychomycosis of toenail    Declines podiatry referral at this time. Foot  exam today.       Hemiplegia, late effect of cerebrovascular disease (HCC)       Follow up plan: Return in about 6 months (around 10/17/2016) for follow up visit.  [name], MD

## 2016-04-17 NOTE — Patient Instructions (Addendum)
You are doing well today. Lab work today. Return as needed or in 6 months for follow up visit.

## 2016-04-17 NOTE — Patient Instructions (Signed)
Mr. Iannello , Thank you for taking time to come for your Medicare Wellness Visit. I appreciate your ongoing commitment to your health goals. Please review the following plan we discussed and let me know if I can assist you in the future.   These are the goals we discussed: Goals    Starting 04/16/16, I will continue to walk at least 5 min daily.       This is a list of the screening recommended for you and due dates:  Health Maintenance  Topic Date Due  . Colon Cancer Screening  04/17/2026*  . Complete foot exam   05/30/2016  . Flu Shot  06/18/2016  . Eye exam for diabetics  07/09/2016  . Hemoglobin A1C  10/17/2016  . Tetanus Vaccine  01/27/2019  . DTaP/Tdap/Td vaccine  Completed  . Shingles Vaccine  Completed  . Pneumonia vaccines  Completed  *Topic was postponed. The date shown is not the original due date.    Preventive Care for Adults  A healthy lifestyle and preventive care can promote health and wellness. Preventive health guidelines for adults include the following key practices.  . A routine yearly physical is a good way to check with your health care provider about your health and preventive screening. It is a chance to share any concerns and updates on your health and to receive a thorough exam.  . Visit your dentist for a routine exam and preventive care every 6 months. Brush your teeth twice a day and floss once a day. Good oral hygiene prevents tooth decay and gum disease.  . The frequency of eye exams is based on your age, health, family medical history, use  of contact lenses, and other factors. Follow your health care provider's ecommendations for frequency of eye exams.  . Eat a healthy diet. Foods like vegetables, fruits, whole grains, low-fat dairy products, and lean protein foods contain the nutrients you need without too many calories. Decrease your intake of foods high in solid fats, added sugars, and salt. Eat the right amount of calories for you. Get  information about a proper diet from your health care provider, if necessary.  . Maintain a healthy weight. The body mass index (BMI) is a screening tool to identify possible weight problems. It provides an estimate of body fat based on height and weight. Your health care provider can find your BMI and can help you achieve or maintain a healthy weight.   For adults 20 years and older: ? A BMI below 18.5 is considered underweight. ? A BMI of 18.5 to 24.9 is normal. ? A BMI of 25 to 29.9 is considered overweight. ? A BMI of 30 and above is considered obese.   . Maintain normal blood lipids and cholesterol levels by exercising and minimizing your intake of saturated fat. Eat a balanced diet with plenty of fruit and vegetables. Blood tests for lipids and cholesterol should begin at age 55 and be repeated every 5 years. If your lipid or cholesterol levels are high, you are over 50, or you are at high risk for heart disease, you may need your cholesterol levels checked more frequently. Ongoing high lipid and cholesterol levels should be treated with medicines if diet and exercise are not working.  . If you smoke, find out from your health care provider how to quit. If you do not use tobacco, please do not start.  . If you choose to drink alcohol, please do not consume more than 2 drinks per  day. One drink is considered to be 12 ounces (355 mL) of beer, 5 ounces (148 mL) of wine, or 1.5 ounces (44 mL) of liquor.  . If you are 6355-74 years old, ask your health care provider if you should take aspirin to prevent strokes.  . Use sunscreen. Apply sunscreen liberally and repeatedly throughout the day. You should seek shade when your shadow is shorter than you. Protect yourself by wearing long sleeves, pants, a wide-brimmed hat, and sunglasses year round, whenever you are outdoors.  . Once a month, do a whole body skin exam, using a mirror to look at the skin on your back. Tell your health care provider of  new moles, moles that have irregular borders, moles that are larger than a pencil eraser, or moles that have changed in shape or color.

## 2016-04-17 NOTE — Assessment & Plan Note (Signed)
Chronic, stable. Continue current regimen. 

## 2016-04-17 NOTE — Progress Notes (Signed)
Pre visit review using our clinic review tool, if applicable. No additional management support is needed unless otherwise documented below in the visit note. 

## 2016-04-17 NOTE — Progress Notes (Signed)
PCP notes:  Health maintenance:  Colon cancer screening - pt declined colonoscopy, cologuard, and fecal occult blood test A1C - will be completed with labs today  Abnormal screenings:  Hearing - failed  Patient concerns: None  Nurse concerns: None  Next PCP appt: 04/17/16 @ 1130

## 2016-04-17 NOTE — Assessment & Plan Note (Addendum)
Chronic, overall adequate control for age and comorbidities. Want to avoid hypoglycemia, pt cannot check sugars at home 2/2 disability, nurse aide checks 3x/wk. Check A1c today.

## 2016-04-17 NOTE — Progress Notes (Signed)
Subjective:   Marcus LankSamuel J Tavares Jr. is a 75 y.o. male who presents for Medicare Annual/Subsequent preventive examination.  Review of Systems:  N/A Cardiac Risk Factors include: advanced age (>6455men, 35>65 women);dyslipidemia;diabetes mellitus;hypertension;male gender;sedentary lifestyle     Objective:    Vitals: BP 142/68 mmHg  Pulse 80  Temp(Src) 98.6 F (37 C) (Oral)  Ht 5' 10.75" (1.797 m)  Wt 207 lb 8 oz (94.121 kg)  BMI 29.15 kg/m2  SpO2 98%  Body mass index is 29.15 kg/(m^2).  Tobacco History  Smoking status  . Former Smoker -- 1.00 packs/day for 30 years  . Types: Cigarettes  . Quit date: 09/07/1996  Smokeless tobacco  . Never Used    Comment: quit after CVA     Counseling given: No   Past Medical History  Diagnosis Date  . Diabetes mellitus type II 1992  . HLD (hyperlipidemia) 10/1998  . HTN (hypertension) 10/1998  . 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 CRI (chronic renal insufficiency)   . RBBB   . Stroke [facility](HCC)     Left sided weakness  . Primary open angle glaucoma     [name]   Past Surgical History  Procedure Laterality Date  . Lipoma removal  1970's    Right shoulder  . Cataract extraction w/phaco  09/09/2012    Procedure: CATARACT EXTRACTION PHACO AND INTRAOCULAR LENS PLACEMENT (IOC);  Surgeon: Chalmers Guestoy [name], MD;  [facility];  Service: Ophthalmology;  Laterality: Right;  . [place] echocardiography  03/2013    mod LVH, EF 60%, normal wall motion  . Eye surgery    . Cataract extraction w/phaco Left 03/31/2013    Procedure: CATARACT EXTRACTION PHACO AND INTRAOCULAR LENS PLACEMENT (IOC);  Surgeon: Chalmers Guestoy [name], MD;  [facility];  Service: Ophthalmology;  Laterality: Left;   Family History  Problem Relation Age of Onset  . Heart attack Father [id number]  . Hypertension Sister     Multiple medical problems  . Stroke Neg Hx   . Cancer Neg Hx   . Diabetes Sister    History  Sexual Activity  . Sexual Activity: No     Outpatient Encounter Prescriptions as of 04/17/2016  Medication Sig  . amLODipine-atorvastatin (CADUET) 10-40 MG tablet TAKE ONE TABLET BY MOUTH ONCE DAILY  . aspirin 325 MG EC tablet Take 325 mg by mouth daily. For stroke prevention    . diclofenac sodium (VOLTAREN) 1 % GEL Apply 1 application topically 3 (three) times daily as needed.  . diltiazem (TAZTIA XT) 360 MG 24 hr capsule Take 1 capsule (360 mg total) by mouth daily.  . insulin aspart protamine-insulin aspart (NOVOLOG 70/30) (70-30) 100 UNIT/ML injection Inject 22 Units into the skin 2 (two) times daily with a meal.   . latanoprost (XALATAN) 0.005 % ophthalmic solution Place 1 drop into both eyes at bedtime.  . Loperamide HCl (IMODIUM PO) Take 1 tablet by mouth as needed.   Marland Kitchen. losartan-hydrochlorothiazide (HYZAAR) 100-25 MG tablet TAKE ONE TABLET BY MOUTH ONCE DAILY  . metFORMIN (GLUCOPHAGE) 850 MG tablet TAKE ONE TABLET BY MOUTH TWICE DAILY WITH MEALS  . potassium chloride (K-DUR) 10 MEQ tablet TAKE THREE TABLETS BY MOUTH ONCE DAILY   No facility-administered encounter medications on file as of 04/17/2016.    Activities of Daily Living In your present state of health, do you have any difficulty performing the following activities: 04/17/2016  Hearing? N  Vision? N  Difficulty concentrating or making decisions?  N  Walking or climbing stairs? Y  Dressing or bathing? N  Doing errands, shopping? Y  Preparing Food and eating ? Y  Using the Toilet? N  In the past six months, have you accidently leaked urine? Y  Do you have problems with loss of bowel control? N  Managing your Medications? Y  Managing your Finances? Y  Housekeeping or managing your Housekeeping? Y    Patient Care Team: [name], MD as PCP - General [name], MD as Consulting Physician (Ophthalmology)   Assessment:     Hearing Screening           Right ear:   40 0 40 0   Left ear:   40 0 0 0   Vision  Screening Comments: Last eye exam in April 2017   Exercise Activities and Dietary recommendations Current Exercise Habits: Home exercise routine, Type of exercise: walking, Frequency (Times/Week): 7, Intensity: Mild, Exercise limited by: neurologic condition(s);orthopedic condition(s)  Goals    . Increase physical activity     Starting 04/17/2016, I will continue to walk at least 5 minutes daily.       Fall Risk Fall Risk  04/17/2016 04/17/2016 02/20/2015 05/26/2012  Falls in the past year? No No Yes -  Number falls in past yr: - - 2 or more -  Injury with Fall? - - No -  Risk Factor Category  - - High Fall Risk -  Risk for fall due to : - - Impaired balance/gait;Impaired mobility Impaired mobility  Follow up - - Falls evaluation completed -   Depression Screen PHQ 2/9 Scores 04/17/2016 04/17/2016 02/20/2015 05/26/2012  PHQ - 2 Score 0 0 0 0    Cognitive Testing MMSE - Mini Mental State Exam 04/17/2016  Orientation to time 5  Orientation to Place 5  Registration 3  Attention/ Calculation 0  Recall 3  Language- name 2 objects 0  Language- repeat 1  Language- follow 3 step command 3  Language- read & follow direction 0  Write a sentence 0  Copy design 0  Total score 20   PLEASE NOTE: A Mini-Cog screen was completed. Maximum score is 20. A value of 0 denotes this part of Folstein MMSE was not completed or the patient failed this part of the Mini-Cog screening.   Mini-Cog Screening Orientation to Time - Max 5 pts Orientation to Place - Max 5 pts Registration - Max 3 pts Recall - Max 3 pts Language Repeat - Max 1 pts Language Follow 3 Step Command - Max 3 pts  Immunization History  Administered Date(s) Administered  . Influenza Split 09/01/2012  . Influenza Whole 09/18/2002, 08/27/2007, 08/29/2008, 09/10/2010  . Influenza,inj,Quad PF,36+ Mos 07/28/2013, 10/17/2014, 08/21/2015  . Pneumococcal Conjugate-13 06/10/2014  . Pneumococcal Polysaccharide-23 08/18/1996, 06/19/2010  . Td  10/31/1998, 01/26/2009  . Zoster 09/01/2012   Screening Tests Health Maintenance  Topic Date Due  . COLONOSCOPY  04/17/2026 (Originally 01/21/1992)  . FOOT EXAM  05/30/2016  . INFLUENZA VACCINE  06/18/2016  . OPHTHALMOLOGY EXAM  07/09/2016  . HEMOGLOBIN A1C  10/17/2016  . TETANUS/TDAP  01/27/2019  . DTaP/Tdap/Td  Completed  . ZOSTAVAX  Completed  . PNA vac Low Risk Adult  Completed      Plan:     I have personally reviewed and addressed the Medicare Annual Wellness questionnaire and have noted the following in the patient's chart:  A. Medical and social history B. Use of alcohol, tobacco or illicit drugs  C. Current medications  and supplements D. Functional ability and status E.  Nutritional status F.  Physical activity G. Advance directives H. List of other physicians I.  Hospitalizations, surgeries, and ER visits in previous 12 months J.  Vitals K. Screenings to include hearing, vision, cognitive, depression L. Referrals and appointments - none  In addition, I have reviewed and discussed with patient certain preventive protocols, quality metrics, and best practice recommendations. A written personalized care plan for preventive services as well as general preventive health recommendations were provided to patient.  See attached scanned questionnaire for additional information.   Signed,   Randa Evens, MHA, BS, LPN Health Advisor

## 2016-05-03 ENCOUNTER — Telehealth: Payer: Self-pay

## 2016-05-03 NOTE — Telephone Encounter (Signed)
Marcus Downs with [facility] left v/m; pt has been wearing AFO shoes with brace; pt was fitted years ago but now those shoes are worn out. Can fax order for AFO to prevent foot drop to [phone]. Last seen 04/17/16.

## 2016-05-03 NOTE — Telephone Encounter (Signed)
Rx written and in Kim's box. 

## 2016-05-06 ENCOUNTER — Other Ambulatory Visit: Payer: Self-pay | Admitting: Family Medicine

## 2016-05-06 NOTE — Telephone Encounter (Signed)
Order faxed.

## 2016-08-13 ENCOUNTER — Other Ambulatory Visit: Payer: Self-pay | Admitting: *Deleted

## 2016-08-13 MED ORDER — METFORMIN HCL 850 MG PO TABS: 850.0000 mg | ORAL_TABLET | Freq: Two times a day (BID) | ORAL | 3 refills | Status: DC

## 2016-09-23 ENCOUNTER — Other Ambulatory Visit: Payer: Self-pay | Admitting: Family Medicine

## 2016-09-25 DIAGNOSIS — E119 Type 2 diabetes mellitus without complications: Secondary | ICD-10-CM | POA: Diagnosis not present

## 2016-09-25 DIAGNOSIS — H26499 Other secondary cataract, unspecified eye: Secondary | ICD-10-CM | POA: Diagnosis not present

## 2016-09-25 DIAGNOSIS — H401132 Primary open-angle glaucoma, bilateral, moderate stage: Secondary | ICD-10-CM | POA: Diagnosis not present

## 2016-09-25 DIAGNOSIS — I639 Cerebral infarction, unspecified: Secondary | ICD-10-CM | POA: Diagnosis not present

## 2016-09-25 LAB — HM DIABETES EYE EXAM

## 2016-10-18 ENCOUNTER — Ambulatory Visit (INDEPENDENT_AMBULATORY_CARE_PROVIDER_SITE_OTHER): Payer: Medicare Other | Admitting: Family Medicine

## 2016-10-18 ENCOUNTER — Encounter: Payer: Self-pay | Admitting: Family Medicine

## 2016-10-18 VITALS — BP 142/60 | HR 91 | Temp 97.9°F | Wt 202.5 lb

## 2016-10-18 DIAGNOSIS — I10 Essential (primary) hypertension: Secondary | ICD-10-CM

## 2016-10-18 DIAGNOSIS — Z794 Long term (current) use of insulin: Secondary | ICD-10-CM | POA: Diagnosis not present

## 2016-10-18 DIAGNOSIS — Z23 Encounter for immunization: Secondary | ICD-10-CM | POA: Diagnosis not present

## 2016-10-18 DIAGNOSIS — E118 Type 2 diabetes mellitus with unspecified complications: Secondary | ICD-10-CM

## 2016-10-18 LAB — BASIC METABOLIC PANEL
BUN: 17 mg/dL (ref 6–23)
CO2: 29 mEq/L (ref 19–32)
CREATININE: 1.05 mg/dL (ref 0.40–1.50)
Calcium: 9.3 mg/dL (ref 8.4–10.5)
Chloride: 103 mEq/L (ref 96–112)
GFR: 88.62 mL/min (ref >60.00)
Glucose, Bld: 121 mg/dL — ABNORMAL HIGH (ref 70–99)
POTASSIUM: 3.9 meq/L (ref 3.5–5.1)
SODIUM: 140 meq/L (ref 135–145)

## 2016-10-18 LAB — HEMOGLOBIN A1C: HEMOGLOBIN A1C: 7.8 % — AB (ref 4.6–6.5)

## 2016-10-18 NOTE — Assessment & Plan Note (Addendum)
Chronic, stable. Recommended take metformin with meals as he notes loose stools otherwise especially at night. Check labs today. cbg's caregiver brings are high. Pt aware we may need to increase 70/30 dose.  Caregiver will call me with glucometer brand he uses.

## 2016-10-18 NOTE — Assessment & Plan Note (Signed)
Chronic, stable. Continue current regimen. 

## 2016-10-18 NOTE — Progress Notes (Signed)
BP (!) 142/60   Pulse 91   Temp 97.9 F (36.6 C) (Oral)   Wt 202 lb 8 oz (91.9 kg)   SpO2 97%   BMI 28.44 kg/m    CC: 6 mo f/u visit Subjective:    Patient ID: Marcus LankSamuel J Brenneman Jr., male    DOB: 07/04/1941, 75 y.o.   MRN: [id number]  HPI: Marcus LankSamuel J Humm Jr. is a 75 y.o. male presenting on 10/18/2016 for 6 month follow up   Here with nurse aide Zaquita.   HTN - Compliant with current antihypertensive regimen of hyzaar 100/25 daily, diltiazem 360mg  daily, amlodipine 10mg  daily. Does not check blood pressures at home. No low blood pressure readings or symptoms of dizziness/syncope.  Denies HA, vision changes, CP/tightness, SOB, leg swelling.    DM - regularly does check sugars 3x/wk between lunch and dinner (190-260). Eats late breakfast and supper. Compliant with antihyperglycemic regimen which includes: metformin 850mg  bid, novolog 70/30 22u BID (family helps with syringes). Denies low sugars or hypoglycemic symptoms.  Denies paresthesias. Last diabetic eye exam 09/25/2016. Pneumovax: 2011. Prevnar: 2015. Metformin is causing diarrhea.  Lab Results  Component Value Date   HGBA1C 7.6 (H) 04/17/2016   Diabetic Foot Exam - Simple   No data filed     Relevant past medical, surgical, family and social history reviewed and updated as indicated. Interim medical history since our last visit reviewed. Allergies and medications reviewed and updated. Current Outpatient Prescriptions on File Prior to Visit  Medication Sig  . amLODipine-atorvastatin (CADUET) 10-40 MG tablet TAKE ONE TABLET BY MOUTH ONCE DAILY  . aspirin 325 MG EC tablet Take 325 mg by mouth daily. For stroke prevention    . diclofenac sodium (VOLTAREN) 1 % GEL Apply 1 application topically 3 (three) times daily as needed.  . diltiazem (TAZTIA XT) 360 MG 24 hr capsule Take 1 capsule (360 mg total) by mouth daily.  . insulin aspart protamine-insulin aspart (NOVOLOG 70/30) (70-30) 100 UNIT/ML injection Inject 22 Units into the skin  2 (two) times daily with a meal.   . latanoprost (XALATAN) 0.005 % ophthalmic solution Place 1 drop into both eyes at bedtime.  . Loperamide HCl (IMODIUM PO) Take 1 tablet by mouth as needed.   Marland Kitchen. losartan-hydrochlorothiazide (HYZAAR) 100-25 MG tablet TAKE ONE TABLET BY MOUTH ONCE DAILY  . metFORMIN (GLUCOPHAGE) 850 MG tablet Take 1 tablet (850 mg total) by mouth 2 (two) times daily with a meal.  . potassium chloride (K-DUR) 10 MEQ tablet TAKE THREE TABLETS BY MOUTH ONCE DAILY  . [DISCONTINUED] diltiazem (CARDIZEM CD) 360 MG 24 hr capsule Take 1 capsule (360 mg total) by mouth daily.   No current facility-administered medications on file prior to visit.     Review of Systems Per HPI unless specifically indicated in ROS section     Objective:    BP (!) 142/60   Pulse 91   Temp 97.9 F (36.6 C) (Oral)   Wt 202 lb 8 oz (91.9 kg)   SpO2 97%   BMI 28.44 kg/m   Wt Readings from Last 3 Encounters:  10/18/16 202 lb 8 oz (91.9 kg)  04/17/16 207 lb 8 oz (94.1 kg)  04/17/16 207 lb 8 oz (94.1 kg)    Physical Exam  Constitutional: He appears well-developed and well-nourished. No distress.  HENT:  Head: Normocephalic and atraumatic.  Right Ear: External ear normal.  Left Ear: External ear normal.  Nose: Nose normal.  Mouth/Throat: Oropharynx is clear and moist.  No oropharyngeal exudate.  Eyes: Conjunctivae and EOM are normal. Pupils are equal, round, and reactive to light. No scleral icterus.  Neck: Normal range of motion. Neck supple.  Cardiovascular: Normal rate, regular rhythm, normal heart sounds and intact distal pulses.   No murmur heard. Pulmonary/Chest: Effort normal and breath sounds normal. No respiratory distress. He has no wheezes. He has no rales.  Musculoskeletal: He exhibits no edema.  See HPI for foot exam if done L hemiparesis  Lymphadenopathy:    He has no cervical adenopathy.  Skin: Skin is warm and dry. No rash noted.  Psychiatric: He has a normal mood and  affect.  Nursing note and vitals reviewed.  Results for orders placed or performed in visit on 10/18/16  HM DIABETES EYE EXAM  Result Value Ref Range   HM Diabetic Eye Exam No Retinopathy No Retinopathy      Assessment & Plan:   Problem List Items Addressed This Visit    Diabetes mellitus type 2, controlled, with complications (HCC) - Primary    Chronic, stable. Recommended take metformin with meals as he notes loose stools otherwise especially at night. Check labs today. cbg's caregiver brings are high. Pt aware we may need to increase 70/30 dose.  Caregiver will call me with glucometer brand he uses.       Relevant Orders   HM DIABETES EYE EXAM (Completed)   Basic metabolic panel   Hemoglobin A1c   Essential hypertension    Chronic, stable. Continue current regimen.          Follow up plan: Return in about 6 months (around 04/18/2017) for medicare wellness visit.  Eustaquio BoydenJavier Tarius Stangelo, MD

## 2016-10-18 NOTE — Patient Instructions (Addendum)
Flu shot today. Labs today.  Call me with brand of glucose meter and strips and lancets your insurance prefers and we will refill. Return in 6 months for medicare wellness visit

## 2016-10-19 ENCOUNTER — Other Ambulatory Visit: Payer: Self-pay | Admitting: Family Medicine

## 2016-10-19 MED ORDER — INSULIN ASPART PROT & ASPART (70-30 MIX) 100 UNIT/ML ~~LOC~~ SUSP: SUBCUTANEOUS | 11 refills | Status: DC

## 2016-10-24 ENCOUNTER — Telehealth: Payer: Self-pay

## 2016-10-24 NOTE — Telephone Encounter (Signed)
I would use the cheaper OTC 70/30 version.  That should be okay to use.  No change in sig. Thanks.

## 2016-10-24 NOTE — Telephone Encounter (Signed)
Spoke with patient. He gave permission to talk with Sharma CovertNorman. Message left advising Sharma Covertorman of SIG and that it was ok to use cheaper version.

## 2016-10-24 NOTE — Addendum Note (Signed)
Addended by: Roena MaladyEVONTENNO, Shenae Bonanno Y on: 10/24/2016 05:05 PM   Modules accepted: Orders

## 2016-10-24 NOTE — Telephone Encounter (Signed)
Norman pts care taker (do not see DPR) left v/m; pt did not want to get the Novolog mix 70/30 that was sent to pharmacy on 10/19/16 because more expensive than what pt has been getting. Pt has been getting Novolin 70/30 OTC which is less expensive. Sharma Covertorman is not sure if the 2 meds are the same and also wants to verify instructions on how pt to take med. [facility] and he said the novolin 70/30 is $24.88 and the Novolog mix 70/30 is $72.36. Pharmacist said the cost difference is because the novolin 70/30 is a [facility] brand insulin. Pharmacist said is not exactly the same med as Novolog 70/30 but similar. When asked if could be substituted for the rx sent on 10/19/16; pharmacist said no is not considered substitutable.Please advise.

## 2016-10-29 NOTE — Telephone Encounter (Signed)
Pt said that Sharma Covertorman went to pick up insulin and [facility] would not let him buy the OTC insulin. [facility] said she did not know why pt could not get OT insulin; she suggested next time ask for [facility] brand 70/30 insulin and should be able to get for around $25.00. Pt voiced understanding.

## 2016-12-02 ENCOUNTER — Other Ambulatory Visit: Payer: Self-pay | Admitting: Family Medicine

## 2016-12-16 ENCOUNTER — Other Ambulatory Visit: Payer: Self-pay | Admitting: Family Medicine

## 2017-03-16 ENCOUNTER — Other Ambulatory Visit: Payer: Self-pay | Admitting: Family Medicine

## 2017-04-02 DIAGNOSIS — I639 Cerebral infarction, unspecified: Secondary | ICD-10-CM | POA: Diagnosis not present

## 2017-04-02 DIAGNOSIS — H47233 Glaucomatous optic atrophy, bilateral: Secondary | ICD-10-CM | POA: Diagnosis not present

## 2017-04-02 DIAGNOSIS — H35033 Hypertensive retinopathy, bilateral: Secondary | ICD-10-CM | POA: Diagnosis not present

## 2017-04-02 DIAGNOSIS — E119 Type 2 diabetes mellitus without complications: Secondary | ICD-10-CM | POA: Diagnosis not present

## 2017-04-21 ENCOUNTER — Other Ambulatory Visit (INDEPENDENT_AMBULATORY_CARE_PROVIDER_SITE_OTHER): Payer: Medicare Other

## 2017-04-21 ENCOUNTER — Ambulatory Visit: Payer: Medicare Other

## 2017-04-21 ENCOUNTER — Telehealth: Payer: Self-pay

## 2017-04-21 DIAGNOSIS — E784 Other hyperlipidemia: Secondary | ICD-10-CM | POA: Diagnosis not present

## 2017-04-21 DIAGNOSIS — E118 Type 2 diabetes mellitus with unspecified complications: Secondary | ICD-10-CM

## 2017-04-21 DIAGNOSIS — I10 Essential (primary) hypertension: Secondary | ICD-10-CM | POA: Diagnosis not present

## 2017-04-21 DIAGNOSIS — E7849 Other hyperlipidemia: Secondary | ICD-10-CM

## 2017-04-21 DIAGNOSIS — Z125 Encounter for screening for malignant neoplasm of prostate: Secondary | ICD-10-CM

## 2017-04-21 LAB — COMPREHENSIVE METABOLIC PANEL
ALT: 24 U/L (ref 0–53)
AST: 25 U/L (ref 0–37)
Albumin: 4 g/dL (ref 3.5–5.2)
Alkaline Phosphatase: 157 U/L — ABNORMAL HIGH (ref 39–117)
BILIRUBIN TOTAL: 0.5 mg/dL (ref 0.2–1.2)
BUN: 15 mg/dL (ref 6–23)
CHLORIDE: 103 meq/L (ref 96–112)
CO2: 25 meq/L (ref 19–32)
CREATININE: 1.19 mg/dL (ref 0.40–1.50)
Calcium: 9.3 mg/dL (ref 8.4–10.5)
GFR: 76.59 mL/min (ref >60.00)
Glucose, Bld: 153 mg/dL — ABNORMAL HIGH (ref 70–99)
Potassium: 4.1 mEq/L (ref 3.5–5.1)
SODIUM: 139 meq/L (ref 135–145)
Total Protein: 7.8 g/dL (ref 6.0–8.3)

## 2017-04-21 LAB — CBC
HEMATOCRIT: 37.2 % — AB (ref 39.0–52.0)
HEMOGLOBIN: 12.2 g/dL — AB (ref 13.0–17.0)
MCHC: 32.9 g/dL (ref 30.0–36.0)
MCV: 73.7 fl — ABNORMAL LOW (ref 78.0–100.0)
Platelets: 313 10*3/uL (ref 150.0–400.0)
RBC: 5.05 Mil/uL (ref 4.22–5.81)
RDW: 20.4 % — ABNORMAL HIGH (ref 11.5–15.5)
WBC: 8.2 10*3/uL (ref 4.0–10.5)

## 2017-04-21 LAB — LIPID PANEL
CHOLESTEROL: 99 mg/dL (ref 0–200)
HDL: 44.6 mg/dL (ref >39.00)
LDL Cholesterol: 42 mg/dL (ref 0–99)
NonHDL: 54.77
Total CHOL/HDL Ratio: 2
Triglycerides: 64 mg/dL (ref 0.0–149.0)
VLDL: 12.8 mg/dL (ref 0.0–40.0)

## 2017-04-21 LAB — PSA, MEDICARE: PSA: 2.33 ng/ml (ref 0.10–4.00)

## 2017-04-21 LAB — TSH: TSH: 1.97 u[IU]/mL (ref 0.35–4.50)

## 2017-04-21 LAB — HEMOGLOBIN A1C: Hgb A1c MFr Bld: 8.1 % — ABNORMAL HIGH (ref 4.6–6.5)

## 2017-04-21 NOTE — Telephone Encounter (Signed)
Lab orders

## 2017-04-25 ENCOUNTER — Ambulatory Visit (INDEPENDENT_AMBULATORY_CARE_PROVIDER_SITE_OTHER): Payer: Medicare Other | Admitting: Family Medicine

## 2017-04-25 ENCOUNTER — Encounter: Payer: Self-pay | Admitting: Family Medicine

## 2017-04-25 VITALS — BP 150/60 | HR 83 | Temp 98.2°F | Ht 71.0 in | Wt 203.5 lb

## 2017-04-25 DIAGNOSIS — N289 Disorder of kidney and ureter, unspecified: Secondary | ICD-10-CM

## 2017-04-25 DIAGNOSIS — E441 Mild protein-calorie malnutrition: Secondary | ICD-10-CM | POA: Diagnosis not present

## 2017-04-25 DIAGNOSIS — Z1211 Encounter for screening for malignant neoplasm of colon: Secondary | ICD-10-CM | POA: Diagnosis not present

## 2017-04-25 DIAGNOSIS — I10 Essential (primary) hypertension: Secondary | ICD-10-CM

## 2017-04-25 DIAGNOSIS — Z87891 Personal history of nicotine dependence: Secondary | ICD-10-CM | POA: Diagnosis not present

## 2017-04-25 DIAGNOSIS — Z7189 Other specified counseling: Secondary | ICD-10-CM

## 2017-04-25 DIAGNOSIS — E785 Hyperlipidemia, unspecified: Secondary | ICD-10-CM | POA: Diagnosis not present

## 2017-04-25 DIAGNOSIS — N400 Enlarged prostate without lower urinary tract symptoms: Secondary | ICD-10-CM

## 2017-04-25 DIAGNOSIS — Z0000 Encounter for general adult medical examination without abnormal findings: Secondary | ICD-10-CM

## 2017-04-25 DIAGNOSIS — E118 Type 2 diabetes mellitus with unspecified complications: Secondary | ICD-10-CM

## 2017-04-25 DIAGNOSIS — Z794 Long term (current) use of insulin: Secondary | ICD-10-CM

## 2017-04-25 DIAGNOSIS — I69954 Hemiplegia and hemiparesis following unspecified cerebrovascular disease affecting left non-dominant side: Secondary | ICD-10-CM

## 2017-04-25 LAB — HEMOCCULT GUIAC POC 1CARD (OFFICE): FECAL OCCULT BLD: NEGATIVE

## 2017-04-25 NOTE — Assessment & Plan Note (Signed)
Chronic, stable. Continue caduet.

## 2017-04-25 NOTE — Patient Instructions (Addendum)
Prostate was enlarged today.  You are doing well today.  Keep sugar log.  Return in 3-4 months for diabetes follow up visit.   Health Maintenance, Male A healthy lifestyle and preventive care is important for your health and wellness. Ask your health care provider about what schedule of regular examinations is right for you. What should I know about weight and diet? Eat a Healthy Diet  Eat plenty of vegetables, fruits, whole grains, low-fat dairy products, and lean protein.  Do not eat a lot of foods high in solid fats, added sugars, or salt.  Maintain a Healthy Weight Regular exercise can help you achieve or maintain a healthy weight. You should:  Do at least 150 minutes of exercise each week. The exercise should increase your heart rate and make you sweat (moderate-intensity exercise).  Do strength-training exercises at least twice a week.  Watch Your Levels of Cholesterol and Blood Lipids  Have your blood tested for lipids and cholesterol every 5 years starting at 35 years of ag50e. If you are at high risk for heart disease, you should start having your blood tested when you are 75 years old. You may need to have your cholesterol levels checked more often if: ? Your lipid or cholesterol levels are high. ? You are older than 75 years of age. ? You are at high risk for heart disease.  What should I know about cancer screening? Many types of cancers can be detected early and may often be prevented. Lung Cancer  You should be screened every year for lung cancer if: ? You are a current smoker who has smoked for at least 30 years. ? You are a former smoker who has quit within the past 15 years.  Talk to your health care provider about your screening options, when you should start screening, and how often you should be screened.  Colorectal Cancer  Routine colorectal cancer screening usually begins at 75 years of age and should be repeated every 5-10 years until you are 75 years  old. You may need to be screened more often if early forms of precancerous polyps or small growths are found. Your health care provider may recommend screening at an earlier age if you have risk factors for colon cancer.  Your health care provider may recommend using home test kits to check for hidden blood in the stool.  A small camera at the end of a tube can be used to examine your colon (sigmoidoscopy or colonoscopy). This checks for the earliest forms of colorectal cancer.  Prostate and Testicular Cancer  Depending on your age and overall health, your health care provider may do certain tests to screen for prostate and testicular cancer.  Talk to your health care provider about any symptoms or concerns you have about testicular or prostate cancer.  Skin Cancer  Check your skin from head to toe regularly.  Tell your health care provider about any new moles or changes in moles, especially if: ? There is a change in a mole's size, shape, or color. ? You have a mole that is larger than a pencil eraser.  Always use sunscreen. Apply sunscreen liberally and repeat throughout the day.  Protect yourself by wearing long sleeves, pants, a wide-brimmed hat, and sunglasses when outside.  What should I know about heart disease, diabetes, and high blood pressure?  If you are 6718-75 years of age, have your blood pressure checked every 3-5 years. If you are 75 years of age or older,  have your blood pressure checked every year. You should have your blood pressure measured twice-once when you are at a hospital or clinic, and once when you are not at a hospital or clinic. Record the average of the two measurements. To check your blood pressure when you are not at a hospital or clinic, you can use: ? An automated blood pressure machine at a pharmacy. ? A home blood pressure monitor.  Talk to your health care provider about your target blood pressure.  If you are between 48-48 years old, ask your  health care provider if you should take aspirin to prevent heart disease.  Have regular diabetes screenings by checking your fasting blood sugar level. ? If you are at a normal weight and have a low risk for diabetes, have this test once every three years after the age of 14. ? If you are overweight and have a high risk for diabetes, consider being tested at a younger age or more often.  A one-time screening for abdominal aortic aneurysm (AAA) by ultrasound is recommended for men aged 13-75 years who are current or former smokers. What should I know about preventing infection? Hepatitis B If you have a higher risk for hepatitis B, you should be screened for this virus. Talk with your health care provider to find out if you are at risk for hepatitis B infection. Hepatitis C Blood testing is recommended for:  Everyone born from [birth year] through 1964.  Anyone with known risk factors for hepatitis C.  Sexually Transmitted Diseases (STDs)  You should be screened each year for STDs including gonorrhea and chlamydia if: ? You are sexually active and are younger than 75 years of age. ? You are older than 75 years of age and your health care provider tells you that you are at risk for this type of infection. ? Your sexual activity has changed since you were last screened and you are at an increased risk for chlamydia or gonorrhea. Ask your health care provider if you are at risk.  Talk with your health care provider about whether you are at high risk of being infected with HIV. Your health care provider may recommend a prescription medicine to help prevent HIV infection.  What else can I do?  Schedule regular health, dental, and eye exams.  Stay current with your vaccines (immunizations).  Do not use any tobacco products, such as cigarettes, chewing tobacco, and e-cigarettes. If you need help quitting, ask your health care provider.  Limit alcohol intake to no more than 2 drinks per day. One  drink equals 12 ounces of beer, 5 ounces of wine, or 1 ounces of hard liquor.  Do not use street drugs.  Do not share needles.  Ask your health care provider for help if you need support or information about quitting drugs.  Tell your health care provider if you often feel depressed.  Tell your health care provider if you have ever been abused or do not feel safe at home. This information is not intended to replace advice given to you by your health care provider. Make sure you discuss any questions you have with your health care provider. Document Released: 05/02/2008 Document Revised: 07/03/2016 Document Reviewed: 08/08/2015 Elsevier Interactive Patient Education  [employer].

## 2017-04-25 NOTE — Assessment & Plan Note (Signed)
This has improved. Will resolve.

## 2017-04-25 NOTE — Assessment & Plan Note (Signed)
Chronic, stable. Continue to monitor. Pt denies significant symptoms.

## 2017-04-25 NOTE — Assessment & Plan Note (Signed)

## 2017-04-25 NOTE — Assessment & Plan Note (Signed)
Cr stable 

## 2017-04-25 NOTE — Assessment & Plan Note (Signed)
Remains abstinent 

## 2017-04-25 NOTE — Assessment & Plan Note (Signed)
Advanced directives - would want sister, Lafonda MossesDiana to be HCPOA. Advanced packet provided today. Would like nurse to come out to house to review info with him.

## 2017-04-25 NOTE — Assessment & Plan Note (Signed)
Chronic, stable. Continue wheelchair use/cane with ambulation. High fall risk.

## 2017-04-25 NOTE — Progress Notes (Signed)
BP (!) 150/60 (BP Location: Right Arm, Cuff Size: Large)   Pulse 83   Temp 98.2 F (36.8 C)   Ht 5\' 11"  (1.803 m)   Wt 203 lb 8 oz (92.3 kg)   SpO2 97%   BMI 28.38 kg/m    CC: AMW Subjective:    Patient ID: Marcus LankSamuel J Mensinger Jr., male    DOB: 03/07/1941, 76 y.o.   MRN: [id number]  HPI: Marcus LankSamuel J Israelson Jr. is a 76 y.o. male presenting on 04/25/2017 for Annual Exam   Transportation issues so unable to see Virl AxeLesia earlier in the week. Arrives 30 min late due to transportation issues again today.   Here with nurse aide Zaquita.  BP elevated today. Doesn't check at home - no BP cuff. Yesterday random cbg 80.  Sugars running ok at home.   Hearing and vision screens not performed. Mult falls in last year without injury 2/2 chronic L hemiparesis. None in last 6 months.  Denies anhedonia, depression, sadness.   Preventative: Colon cancer screening - never had colonoscopy. Declines iFOB as well - difficulty completing 2/2 hemiparesis. In office hemoccult today.  Requests prostate check yearly. Brother in law died from prostate cancer. Discussed final screen today.  Flu yearly Td 2010 Pneumovax 2011, prevnar 05/2014.  zostavax - 08/2012 Advanced directives - would want sister, Marcus Downs to be HCPOA. Advanced packet provided today. Would like nurse to come out to house to review info with him.  Seat belt use discussed. Sunscreen use discussed. No changing moles on skin.  Ex smoker - quit 1997 Alcohol - none  Lives alone. Has nurse aide Zaquita who helps  Family nearby - 2 sisters and nieces/nephews in area. Activity:  Diet: good water, fruits/vegetables daily  Relevant past medical, surgical, family and social history reviewed and updated as indicated. Interim medical history since our last visit reviewed. Allergies and medications reviewed and updated. Outpatient Medications Prior to Visit  Medication Sig Dispense Refill  . amLODipine-atorvastatin (CADUET) 10-40 MG tablet TAKE ONE TABLET BY  MOUTH ONCE DAILY 90 tablet 1  . aspirin 325 MG EC tablet Take 325 mg by mouth daily. For stroke prevention      . diclofenac sodium (VOLTAREN) 1 % GEL Apply 1 application topically 3 (three) times daily as needed. 1 Tube 3  . diltiazem (TIAZAC) 360 MG 24 hr capsule TAKE ONE CAPSULE BY MOUTH ONCE DAILY 90 capsule 2  . insulin aspart protamine- aspart (NOVOLOG MIX 70/30) (70-30) 100 UNIT/ML injection Take 25 units with breakfast and 22 units with supper. QS 1 month 30 mL 11  . latanoprost (XALATAN) 0.005 % ophthalmic solution Place 1 drop into both eyes at bedtime.    . Loperamide HCl (IMODIUM PO) Take 1 tablet by mouth as needed.     Marland Kitchen. losartan-hydrochlorothiazide (HYZAAR) 100-25 MG tablet TAKE ONE TABLET BY MOUTH ONCE DAILY 90 tablet 2  . metFORMIN (GLUCOPHAGE) 850 MG tablet Take 1 tablet (850 mg total) by mouth 2 (two) times daily with a meal. 180 tablet 3  . potassium chloride (K-DUR) 10 MEQ tablet TAKE THREE TABLETS BY MOUTH ONCE DAILY 270 tablet 1   No facility-administered medications prior to visit.      Per HPI unless specifically indicated in ROS section below Review of Systems     Objective:    BP (!) 150/60 (BP Location: Right Arm, Cuff Size: Large)   Pulse 83   Temp 98.2 F (36.8 C)   Ht 5\' 11"  (1.803 m)  Wt 203 lb 8 oz (92.3 kg)   SpO2 97%   BMI 28.38 kg/m   Wt Readings from Last 3 Encounters:  04/25/17 203 lb 8 oz (92.3 kg)  10/18/16 202 lb 8 oz (91.9 kg)  04/17/16 207 lb 8 oz (94.1 kg)    Physical Exam  Constitutional: He is oriented to person, place, and time. He appears well-developed and well-nourished. No distress.  In wheelchair today Uses 2 prong cane for ambulation  HENT:  Head: Normocephalic and atraumatic.  Right Ear: Hearing, tympanic membrane, external ear and ear canal normal.  Left Ear: Hearing, tympanic membrane, external ear and ear canal normal.  Nose: Nose normal.  Mouth/Throat: Uvula is midline, oropharynx is clear and moist and mucous  membranes are normal. No oropharyngeal exudate, posterior oropharyngeal edema or posterior oropharyngeal erythema.  Eyes: Conjunctivae and EOM are normal. Pupils are equal, round, and reactive to light. No scleral icterus.  Neck: Normal range of motion. Neck supple. Carotid bruit is not present.  Cardiovascular: Normal rate, regular rhythm, normal heart sounds and intact distal pulses.   No murmur heard. Pulses:      Radial pulses are 2+ on the right side, and 2+ on the left side.  Pulmonary/Chest: Effort normal and breath sounds normal. No respiratory distress. He has no wheezes. He has no rales.  Abdominal: Soft. Bowel sounds are normal. He exhibits no distension and no mass. There is no tenderness. There is no rebound and no guarding.  Genitourinary: Rectum normal. Rectal exam shows no external hemorrhoid, no internal hemorrhoid, no fissure, no mass, no tenderness, anal tone normal and guaiac negative stool. Prostate is enlarged (40+gm). Prostate is not tender.  Musculoskeletal: Normal range of motion. He exhibits no edema.  Lymphadenopathy:    He has no cervical adenopathy.  Neurological: He is alert and oriented to person, place, and time.  CN grossly intact, station and gait intact Recall 1/3, 3/3 with cue Calculation 5/5 serial 3s  Skin: Skin is warm and dry. No rash noted.  Psychiatric: He has a normal mood and affect. His behavior is normal. Judgment and thought content normal.  Nursing note and vitals reviewed.  Results for orders placed or performed in visit on 04/25/17  Hemoccult - 1 Card (office)  Result Value Ref Range   Fecal Occult Blood, POC Negative Negative   Card #1 Date 04/25/17    Card #2 Fecal Occult Blod, POC     Card #2 Date     Card #3 Fecal Occult Blood, POC     Card #3 Date        Assessment & Plan:   Problem List Items Addressed This Visit    Advanced care planning/counseling discussion    Advanced directives - would want sister, Marcus Mosses to be HCPOA.  Advanced packet provided today. Would like nurse to come out to house to review info with him.       Benign prostatic hyperplasia    Chronic, stable. Continue to monitor. Pt denies significant symptoms.       Diabetes mellitus type 2, controlled, with complications (HCC)    Chronic, stable. Continue current regimen.  I provided nurse aide with sugar log to start monitoring at home.  Encouraged avoiding carbs/sugars in diet to help lower A1c.       Essential hypertension    Chronic, elevated today. He does not check at home. Continue current regimen, if persistently elevated next visit, consider additional treatment.       Ex-smoker  Remains abstinent.       Hemiplegia, late effect of cerebrovascular disease (HCC)    Chronic, stable. Continue wheelchair use/cane with ambulation. High fall risk.       HLD (hyperlipidemia)    Chronic, stable. Continue caduet.       Medicare annual wellness visit, subsequent - Primary    I have personally reviewed the Medicare Annual Wellness questionnaire and have noted 1. The patient's medical and social history 2. Their use of alcohol, tobacco or illicit drugs 3. Their current medications and supplements 4. The patient's functional ability including ADL's, fall risks, home safety risks and hearing or visual impairment. Cognitive function has been assessed and addressed as indicated.  5. Diet and physical activity 6. Evidence for depression or mood disorders The patients weight, height, BMI have been recorded in the chart. I have made referrals, counseling and provided education to the patient based on review of the above and I have provided the pt with a written personalized care plan for preventive services. Provider list updated.. See scanned questionairre as needed for further documentation. Reviewed preventative protocols and updated unless pt declined.       RESOLVED: Protein calorie malnutrition (HCC)    This has improved. Will  resolve.      Renal insufficiency    Cr stable.        Other Visit Diagnoses    Special screening for malignant neoplasms, colon       Relevant Orders   Hemoccult - 1 Card (office) (Completed)       Follow up plan: Return in about 3 months (around 07/26/2017) for follow up visit.  [name], MD

## 2017-04-25 NOTE — Assessment & Plan Note (Signed)
Chronic, stable. Continue current regimen.  I provided nurse aide with sugar log to start monitoring at home.  Encouraged avoiding carbs/sugars in diet to help lower A1c.

## 2017-04-25 NOTE — Assessment & Plan Note (Signed)
Chronic, elevated today. He does not check at home. Continue current regimen, if persistently elevated next visit, consider additional treatment.

## 2017-06-07 ENCOUNTER — Other Ambulatory Visit: Payer: Self-pay | Admitting: Family Medicine

## 2017-07-02 DIAGNOSIS — H401132 Primary open-angle glaucoma, bilateral, moderate stage: Secondary | ICD-10-CM | POA: Diagnosis not present

## 2017-07-07 ENCOUNTER — Other Ambulatory Visit: Payer: Self-pay | Admitting: Family Medicine

## 2017-07-11 ENCOUNTER — Telehealth: Payer: Self-pay

## 2017-07-11 DIAGNOSIS — R600 Localized edema: Secondary | ICD-10-CM

## 2017-07-11 NOTE — Telephone Encounter (Signed)
Marcus Downs with [facility] left v/m requesting order for 30/40 compression support stockings due to bilateral ankle swelling. Randy request cb.

## 2017-07-11 NOTE — Telephone Encounter (Signed)
Rx written and in CMA box 

## 2017-07-14 NOTE — Telephone Encounter (Signed)
Spoke to Marcus Downs who states his fax was down at the current time; orders mailed to pt as requested

## 2017-08-08 ENCOUNTER — Ambulatory Visit: Payer: Medicare Other | Admitting: Family Medicine

## 2017-08-15 ENCOUNTER — Encounter: Payer: Self-pay | Admitting: Family Medicine

## 2017-08-15 ENCOUNTER — Other Ambulatory Visit: Payer: Self-pay | Admitting: Family Medicine

## 2017-08-15 ENCOUNTER — Ambulatory Visit (INDEPENDENT_AMBULATORY_CARE_PROVIDER_SITE_OTHER): Payer: Medicare Other | Admitting: Family Medicine

## 2017-08-15 VITALS — BP 144/64 | HR 75 | Temp 98.2°F | Wt 202.0 lb

## 2017-08-15 DIAGNOSIS — Z23 Encounter for immunization: Secondary | ICD-10-CM | POA: Diagnosis not present

## 2017-08-15 DIAGNOSIS — I69954 Hemiplegia and hemiparesis following unspecified cerebrovascular disease affecting left non-dominant side: Secondary | ICD-10-CM

## 2017-08-15 DIAGNOSIS — Z794 Long term (current) use of insulin: Secondary | ICD-10-CM

## 2017-08-15 DIAGNOSIS — I10 Essential (primary) hypertension: Secondary | ICD-10-CM | POA: Diagnosis not present

## 2017-08-15 DIAGNOSIS — R600 Localized edema: Secondary | ICD-10-CM

## 2017-08-15 DIAGNOSIS — E118 Type 2 diabetes mellitus with unspecified complications: Secondary | ICD-10-CM

## 2017-08-15 NOTE — Patient Instructions (Addendum)
Flu shot today I do recommend returning to see foot doctor for foot exam. You last saw him 2 years ago.  Have your assistant write down sugar readings.  Return in 4-6 months for follow up visit.

## 2017-08-15 NOTE — Assessment & Plan Note (Signed)
Chronic, stable. Update labs.  

## 2017-08-15 NOTE — Progress Notes (Signed)
BP (!) 144/64 (BP Location: Right Arm, Patient Position: Sitting, Cuff Size: Normal)   Pulse 75   Temp 98.2 F (36.8 C) (Oral)   Wt 202 lb (91.6 kg)   SpO2 98%   BMI 28.17 kg/m    CC: 3 mo DM f/u visit Subjective:    Patient ID: Marcus Lank., male    DOB: 09/10/1941, 76 y.o.   MRN: [id number]  HPI: Marcus Rhodes. is a 76 y.o. male presenting on 08/15/2017 for DM follow-up   Here alone today.   HTN - complaint with . Diltiazem  daily, amlodipine , and hyzaar 100/25mg  daily. Denies HA, vision changes, CP/tightness, SOB, leg swelling.   DM - does not regularly check sugars (difficulty with praxis for cbg's). Did not bring log (nurse aide). Compliant with antihyperglycemic regimen which includes: novolog 70/30 25/22 u daily and metformin  bid. Denies low sugars or hypoglycemic symptoms. Denies paresthesias. Last diabetic eye exam 09/2016. Pneumovax: 2011. Prevnar: 2015. Glucometer brand: unsure. DSME: not interested. Last saw podiatry 08/2015 Lab Results  Component Value Date   HGBA1C 8.1 (H) 04/21/2017   Diabetic Foot Exam - Simple   Simple Foot Form Diabetic Foot exam was performed with the following findings:  Yes 08/15/2017  2:26 PM  Visual Inspection See comments:  Yes Sensation Testing See comments:  Yes Pulse Check Posterior Tibialis and Dorsalis pulse intact bilaterally:  Yes Comments Significant skin thickening throughout lateral feet and toes Elongated thickened onychomycotic nails Diminished sensation to monofilament testing    Lab Results  Component Value Date   MICROALBUR 8.2 (H) 09/01/2012     Relevant past medical, surgical, family and social history reviewed and updated as indicated. Interim medical history since our last visit reviewed. Allergies and medications reviewed and updated. Outpatient Medications Prior to Visit  Medication Sig Dispense Refill  . amLODipine-atorvastatin (CADUET) 10-40 MG tablet TAKE ONE TABLET BY MOUTH ONCE  DAILY 90 tablet 1  . aspirin 325 MG EC tablet Take 325 mg by mouth daily. For stroke prevention      . diclofenac sodium (VOLTAREN) 1 % GEL Apply 1 application topically 3 (three) times daily as needed. 1 Tube 3  . diltiazem (TIAZAC) 360 MG 24 hr capsule TAKE ONE CAPSULE BY MOUTH ONCE DAILY 90 capsule 2  . insulin aspart protamine- aspart (NOVOLOG MIX 70/30) (70-30) 100 UNIT/ML injection Take 25 units with breakfast and 22 units with supper. QS 1 month 30 mL 11  . latanoprost (XALATAN) 0.005 % ophthalmic solution Place 1 drop into both eyes at bedtime.    . Loperamide HCl (IMODIUM PO) Take 1 tablet by mouth as needed.     Marland Kitchen losartan-hydrochlorothiazide (HYZAAR) 100-25 MG tablet TAKE ONE TABLET BY MOUTH ONCE DAILY 90 tablet 2  . metFORMIN (GLUCOPHAGE) 850 MG tablet Take 1 tablet (850 mg total) by mouth 2 (two) times daily with a meal. 180 tablet 3  . potassium chloride (K-DUR) 10 MEQ tablet TAKE THREE TABLETS BY MOUTH ONCE DAILY 270 tablet 3   No facility-administered medications prior to visit.      Per HPI unless specifically indicated in ROS section below Review of Systems     Objective:    BP (!) 144/64 (BP Location: Right Arm, Patient Position: Sitting, Cuff Size: Normal)   Pulse 75   Temp 98.2 F (36.8 C) (Oral)   Wt 202 lb (91.6 kg)   SpO2 98%   BMI 28.17 kg/m   Wt Readings from Last 3  Encounters:  08/15/17 202 lb (91.6 kg)  04/25/17 203 lb 8 oz (92.3 kg)  10/18/16 202 lb 8 oz (91.9 kg)    Physical Exam  Constitutional: He appears well-developed and well-nourished. No distress.  HENT:  Head: Normocephalic and atraumatic.  Right Ear: External ear normal.  Left Ear: External ear normal.  Nose: Nose normal.  Mouth/Throat: Oropharynx is clear and moist. No oropharyngeal exudate.  Eyes: Pupils are equal, round, and reactive to light. Conjunctivae and EOM are normal. No scleral icterus.  Neck: Normal range of motion. Neck supple.  Cardiovascular: Normal rate, regular  rhythm, normal heart sounds and intact distal pulses.   No murmur heard. Pulmonary/Chest: Effort normal and breath sounds normal. No respiratory distress. He has no wheezes. He has no rales.  Musculoskeletal: He exhibits edema (1+ pitting).  See HPI for foot exam if done  Lymphadenopathy:    He has no cervical adenopathy.  Skin: Skin is warm and dry. No rash noted.  Psychiatric: He has a normal mood and affect.  Nursing note and vitals reviewed.  Results for orders placed or performed in visit on 04/25/17  Hemoccult - 1 Card (office)  Result Value Ref Range   Fecal Occult Blood, POC Negative Negative   Card #1 Date 04/25/17    Card #2 Fecal Occult Blod, POC     Card #2 Date     Card #3 Fecal Occult Blood, POC     Card #3 Date        Assessment & Plan:   Problem List Items Addressed This Visit    Diabetes mellitus type 2, controlled, with complications (HCC) - Primary    Chronic, stable. Update labs.       Relevant Orders   Basic metabolic panel   Hemoglobin A1c   Essential hypertension    Chronic, adequate for age. Continue current regimen.       Hemiplegia, late effect of cerebrovascular disease (HCC)   Pedal edema    Significant dependent edema. Consider lasix.           Follow up plan: Return in about 4 months (around 12/15/2017) for follow up visit.  [name], MD

## 2017-08-15 NOTE — Addendum Note (Signed)
Addended by: Nanci Pina on: 08/15/2017 03:33 PM   Modules accepted: Orders

## 2017-08-15 NOTE — Assessment & Plan Note (Addendum)
Significant dependent edema. Consider lasix.

## 2017-08-15 NOTE — Assessment & Plan Note (Signed)
Chronic, adequate for age. Continue current regimen.  

## 2017-08-18 NOTE — Addendum Note (Signed)
Addended by: Alvina Chou on: 08/18/2017 02:28 PM   Modules accepted: Orders

## 2017-08-27 ENCOUNTER — Other Ambulatory Visit (INDEPENDENT_AMBULATORY_CARE_PROVIDER_SITE_OTHER): Payer: Medicare Other

## 2017-08-27 DIAGNOSIS — E118 Type 2 diabetes mellitus with unspecified complications: Secondary | ICD-10-CM

## 2017-08-27 DIAGNOSIS — I10 Essential (primary) hypertension: Secondary | ICD-10-CM

## 2017-08-27 DIAGNOSIS — Z794 Long term (current) use of insulin: Secondary | ICD-10-CM

## 2017-08-27 DIAGNOSIS — R600 Localized edema: Secondary | ICD-10-CM

## 2017-08-27 LAB — BASIC METABOLIC PANEL
BUN: 14 mg/dL (ref 6–23)
CALCIUM: 9.1 mg/dL (ref 8.4–10.5)
CO2: 26 mEq/L (ref 19–32)
CREATININE: 1.2 mg/dL (ref 0.40–1.50)
Chloride: 100 mEq/L (ref 96–112)
GFR: 75.78 mL/min (ref >60.00)
GLUCOSE: 288 mg/dL — AB (ref 70–99)
Potassium: 3.8 mEq/L (ref 3.5–5.1)
Sodium: 136 mEq/L (ref 135–145)

## 2017-08-27 LAB — HEMOGLOBIN A1C: HEMOGLOBIN A1C: 8.4 % — AB (ref 4.6–6.5)

## 2017-08-30 ENCOUNTER — Other Ambulatory Visit: Payer: Self-pay | Admitting: Family Medicine

## 2017-11-20 ENCOUNTER — Telehealth: Payer: Self-pay | Admitting: Family Medicine

## 2017-11-20 NOTE — Telephone Encounter (Signed)
Copied from CRM [id number]#10/09/1941. Topic: Quick Communication - See Telephone Encounter >> Nov 20, 2017  4:37 PM Windy KalataMichael, Kermitt Harjo L, NT wrote: CRM for notification. See Telephone encounter for:  11/20/17.  Patient states he is out of his test strips and is needing a refill. He states the name of them are called [facility]. I did not see that on his med list. Please advise.  [facility]

## 2017-11-21 MED ORDER — GLUCOSE BLOOD VI STRP: ORAL_STRIP | 12 refills | Status: DC

## 2017-11-21 NOTE — Telephone Encounter (Signed)
Sent in

## 2017-11-27 ENCOUNTER — Other Ambulatory Visit: Payer: Self-pay | Admitting: Family Medicine

## 2017-12-12 ENCOUNTER — Encounter: Payer: Self-pay | Admitting: Family Medicine

## 2017-12-12 ENCOUNTER — Ambulatory Visit (INDEPENDENT_AMBULATORY_CARE_PROVIDER_SITE_OTHER): Payer: Medicare Other | Admitting: Family Medicine

## 2017-12-12 VITALS — BP 136/70 | HR 71 | Temp 98.8°F | Wt 206.0 lb

## 2017-12-12 DIAGNOSIS — IMO0002 Reserved for concepts with insufficient information to code with codable children: Secondary | ICD-10-CM

## 2017-12-12 DIAGNOSIS — E1165 Type 2 diabetes mellitus with hyperglycemia: Secondary | ICD-10-CM

## 2017-12-12 DIAGNOSIS — I10 Essential (primary) hypertension: Secondary | ICD-10-CM | POA: Diagnosis not present

## 2017-12-12 DIAGNOSIS — E118 Type 2 diabetes mellitus with unspecified complications: Secondary | ICD-10-CM

## 2017-12-12 DIAGNOSIS — I69954 Hemiplegia and hemiparesis following unspecified cerebrovascular disease affecting left non-dominant side: Secondary | ICD-10-CM | POA: Diagnosis not present

## 2017-12-12 MED ORDER — GLUCOSE BLOOD VI STRP: ORAL_STRIP | 12 refills | Status: DC

## 2017-12-12 NOTE — Patient Instructions (Addendum)
Labs today.  Bring sugar log to next appointment to review numbers.  Keep appointment with Dr Harlon FlorWhitaker eye doctor 12/2017.  If interested, check with pharmacy about new 2 shot shingles series (shingrix).  Return after 04/25/2018 for medicare wellness with Virl AxeLesia and follow up with me on same day.

## 2017-12-12 NOTE — Assessment & Plan Note (Signed)
Chronic, above goal. Update labs. Did not bring sugar log.

## 2017-12-12 NOTE — Progress Notes (Signed)
BP 136/70 (BP Location: Left Arm, Patient Position: Sitting, Cuff Size: Normal)   Pulse 71   Temp 98.8 F (37.1 C) (Oral)   Wt 206 lb (93.4 kg)   SpO2 97%   BMI 28.73 kg/m    CC: 68mo f/u visit Subjective:    Patient ID: Marcus Lank., male    DOB: October 23, 1941, 77 y.o.   MRN: [id number]  HPI: Marcus Windmiller. is a 77 y.o. male presenting on 12/12/2017 for 4 mo follow-up   DM - does not regularly check sugars (difficulty with praxis for cbgs). Checks sugars 3 times a week, 2 hours after lunch. Doesn't have fasting sugars checked. Did not bring sugar log or glucometer. Compliant with antihyperglycemic regimen which includes: novolog 70/30 25/22u daily, metformin 850mg  bid. Denies low sugars or hypoglycemic symptoms. Denies paresthesias. Last diabetic eye exam 09/2016 - states has upcoming appt. Pneumovax: 2011. Prevnar: 2015. Glucometer brand: Embrace. DSME: not interested.  Lab Results  Component Value Date   HGBA1C 8.4 (H) 08/27/2017   Diabetic Foot Exam - Simple   No data filed     Lab Results  Component Value Date   MICROALBUR 8.2 (H) 09/01/2012     Relevant past medical, surgical, family and social history reviewed and updated as indicated. Interim medical history since our last visit reviewed. Allergies and medications reviewed and updated. Outpatient Medications Prior to Visit  Medication Sig Dispense Refill  . amLODipine-atorvastatin (CADUET) 10-40 MG tablet TAKE ONE TABLET BY MOUTH ONCE DAILY 90 tablet 1  . aspirin 325 MG EC tablet Take 325 mg by mouth daily. For stroke prevention      . diclofenac sodium (VOLTAREN) 1 % GEL Apply 1 application topically 3 (three) times daily as needed. 1 Tube 3  . diltiazem (TIAZAC) 360 MG 24 hr capsule TAKE 1 CAPSULE BY MOUTH ONCE DAILY 90 capsule 0  . insulin aspart protamine- aspart (NOVOLOG MIX 70/30) (70-30) 100 UNIT/ML injection Take 25 units with breakfast and 22 units with supper. QS 1 month 30 mL 11  . latanoprost  (XALATAN) 0.005 % ophthalmic solution Place 1 drop into both eyes at bedtime.    . Loperamide HCl (IMODIUM PO) Take 1 tablet by mouth as needed.     Marland Kitchen losartan-hydrochlorothiazide (HYZAAR) 100-25 MG tablet TAKE ONE TABLET BY MOUTH ONCE DAILY 90 tablet 2  . metFORMIN (GLUCOPHAGE) 850 MG tablet TAKE ONE TABLET BY MOUTH TWICE DAILY WITH A MEAL 180 tablet 1  . potassium chloride (K-DUR) 10 MEQ tablet TAKE THREE TABLETS BY MOUTH ONCE DAILY 270 tablet 3  . glucose blood (EMBRACE BLOOD GLUCOSE TEST) test strip Check three times daily and when feeling ill E11.8 insulin dependent 100 each 12   No facility-administered medications prior to visit.      Per HPI unless specifically indicated in ROS section below Review of Systems     Objective:    BP 136/70 (BP Location: Left Arm, Patient Position: Sitting, Cuff Size: Normal)   Pulse 71   Temp 98.8 F (37.1 C) (Oral)   Wt 206 lb (93.4 kg)   SpO2 97%   BMI 28.73 kg/m   Wt Readings from Last 3 Encounters:  12/12/17 206 lb (93.4 kg)  08/15/17 202 lb (91.6 kg)  04/25/17 203 lb 8 oz (92.3 kg)    Physical Exam  Constitutional: He appears well-developed and well-nourished. No distress.  HENT:  Head: Normocephalic and atraumatic.  Right Ear: External ear normal.  Left Ear: External ear  normal.  Nose: Nose normal.  Mouth/Throat: Oropharynx is clear and moist. No oropharyngeal exudate.  Eyes: Conjunctivae and EOM are normal. Pupils are equal, round, and reactive to light. No scleral icterus.  Neck: Normal range of motion. Neck supple.  Cardiovascular: Normal rate, regular rhythm, normal heart sounds and intact distal pulses.  No murmur heard. Pulmonary/Chest: Effort normal and breath sounds normal. No respiratory distress. He has no wheezes. He has no rales.  Musculoskeletal: He exhibits edema (1+ pedal).  See HPI for foot exam if done  Lymphadenopathy:    He has no cervical adenopathy.  Skin: Skin is warm and dry. No rash noted.    Psychiatric: He has a normal mood and affect.  Nursing note and vitals reviewed.  Results for orders placed or performed in visit on 08/27/17  Basic metabolic panel  Result Value Ref Range   Sodium 136 135 - 145 mEq/L   Potassium 3.8 3.5 - 5.1 mEq/L   Chloride 100 96 - 112 mEq/L   CO2 26 19 - 32 mEq/L   Glucose, Bld 288 (H) 70 - 99 mg/dL   BUN 14 6 - 23 mg/dL   Creatinine, Ser 1.611.20 0.40 - 1.50 mg/dL   Calcium 9.1 8.4 - 09.610.5 mg/dL   GFR 04.5475.78 >09.81>60.00 mL/min  Hemoglobin A1c  Result Value Ref Range   Hgb A1c MFr Bld 8.4 (H) 4.6 - 6.5 %      Assessment & Plan:   Problem List Items Addressed This Visit    Diabetes mellitus type 2, uncontrolled, with complications (HCC) - Primary    Chronic, above goal. Update labs. Did not bring sugar log.       Relevant Orders   Hemoglobin A1c   Basic metabolic panel   Essential hypertension    Chronic, stable. Continue current regimen.       Hemiplegia, late effect of cerebrovascular disease (HCC)       Follow up plan: Return in about 4 months (around 04/11/2018), or if symptoms worsen or fail to improve, for follow up visit, medicare wellness visit.  [name], MD

## 2017-12-12 NOTE — Assessment & Plan Note (Signed)
Chronic, stable. Continue current regimen. 

## 2017-12-13 ENCOUNTER — Other Ambulatory Visit: Payer: Self-pay | Admitting: Family Medicine

## 2017-12-13 LAB — HEMOGLOBIN A1C
HEMOGLOBIN A1C: 9.3 %{Hb} — AB (ref <5.7)
Mean Plasma Glucose: 220 (calc)
eAG (mmol/L): 12.2 (calc)

## 2017-12-13 LAB — BASIC METABOLIC PANEL
BUN: 13 mg/dL (ref 7–25)
CO2: 26 mmol/L (ref 20–32)
CREATININE: 1.1 mg/dL (ref 0.70–1.18)
Calcium: 9.3 mg/dL (ref 8.6–10.3)
Chloride: 105 mmol/L (ref 98–110)
Glucose, Bld: 114 mg/dL — ABNORMAL HIGH (ref 65–99)
Potassium: 4.2 mmol/L (ref 3.5–5.3)
SODIUM: 139 mmol/L (ref 135–146)

## 2017-12-13 MED ORDER — METFORMIN HCL 1000 MG PO TABS: 1000.0000 mg | ORAL_TABLET | Freq: Two times a day (BID) | ORAL | 3 refills | Status: DC

## 2017-12-16 ENCOUNTER — Telehealth: Payer: Self-pay

## 2017-12-16 NOTE — Telephone Encounter (Signed)
Spoke with pt informing him that [facility] [address] says they cannot get the Embrace test strips and suggested another pharmacy or change to Accu-Chek.  Pt says he no longer uses Embrace. He uses Gmate glucometer and test strips.

## 2017-12-22 ENCOUNTER — Other Ambulatory Visit: Payer: Self-pay | Admitting: Family Medicine

## 2017-12-29 ENCOUNTER — Telehealth: Payer: Self-pay | Admitting: Family Medicine

## 2017-12-29 MED ORDER — LOSARTAN POTASSIUM 100 MG PO TABS: 100.0000 mg | ORAL_TABLET | Freq: Every day | ORAL | 1 refills | Status: DC

## 2017-12-29 MED ORDER — HYDROCHLOROTHIAZIDE 25 MG PO TABS: 25.0000 mg | ORAL_TABLET | Freq: Every day | ORAL | 1 refills | Status: DC

## 2017-12-29 NOTE — Telephone Encounter (Signed)
Received notice that hyzaar is on back order through June Will split into individual components. plz notify patient.

## 2017-12-29 NOTE — Telephone Encounter (Signed)
Spoke pt relaying message per Dr. Reece AgarG.  Pt verbalizes understanding.

## 2018-02-15 ENCOUNTER — Other Ambulatory Visit: Payer: Self-pay | Admitting: Family Medicine

## 2018-04-19 ENCOUNTER — Other Ambulatory Visit: Payer: Self-pay | Admitting: Family Medicine

## 2018-04-19 DIAGNOSIS — E118 Type 2 diabetes mellitus with unspecified complications: Principal | ICD-10-CM

## 2018-04-19 DIAGNOSIS — N400 Enlarged prostate without lower urinary tract symptoms: Secondary | ICD-10-CM

## 2018-04-19 DIAGNOSIS — IMO0002 Reserved for concepts with insufficient information to code with codable children: Secondary | ICD-10-CM

## 2018-04-19 DIAGNOSIS — N289 Disorder of kidney and ureter, unspecified: Secondary | ICD-10-CM

## 2018-04-19 DIAGNOSIS — D509 Iron deficiency anemia, unspecified: Secondary | ICD-10-CM

## 2018-04-19 DIAGNOSIS — E785 Hyperlipidemia, unspecified: Secondary | ICD-10-CM

## 2018-04-19 DIAGNOSIS — E1165 Type 2 diabetes mellitus with hyperglycemia: Secondary | ICD-10-CM

## 2018-04-20 ENCOUNTER — Ambulatory Visit (INDEPENDENT_AMBULATORY_CARE_PROVIDER_SITE_OTHER): Payer: Medicare Other

## 2018-04-20 ENCOUNTER — Ambulatory Visit: Payer: Medicare Other

## 2018-04-20 VITALS — BP 138/74 | HR 96 | Temp 99.0°F | Wt 206.0 lb

## 2018-04-20 DIAGNOSIS — E118 Type 2 diabetes mellitus with unspecified complications: Secondary | ICD-10-CM

## 2018-04-20 DIAGNOSIS — IMO0002 Reserved for concepts with insufficient information to code with codable children: Secondary | ICD-10-CM

## 2018-04-20 DIAGNOSIS — E785 Hyperlipidemia, unspecified: Secondary | ICD-10-CM

## 2018-04-20 DIAGNOSIS — E1165 Type 2 diabetes mellitus with hyperglycemia: Secondary | ICD-10-CM | POA: Diagnosis not present

## 2018-04-20 DIAGNOSIS — D509 Iron deficiency anemia, unspecified: Secondary | ICD-10-CM

## 2018-04-20 DIAGNOSIS — Z0000 Encounter for general adult medical examination without abnormal findings: Secondary | ICD-10-CM

## 2018-04-20 LAB — CBC WITH DIFFERENTIAL/PLATELET
Basophils Absolute: 0.1 10*3/uL (ref 0.0–0.1)
Basophils Relative: 1.4 % (ref 0.0–3.0)
EOS PCT: 2.1 % (ref 0.0–5.0)
Eosinophils Absolute: 0.2 10*3/uL (ref 0.0–0.7)
HEMATOCRIT: 33.7 % — AB (ref 39.0–52.0)
HEMOGLOBIN: 11 g/dL — AB (ref 13.0–17.0)
LYMPHS PCT: 19.8 % (ref 12.0–46.0)
Lymphs Abs: 1.5 10*3/uL (ref 0.7–4.0)
MCHC: 32.6 g/dL (ref 30.0–36.0)
MCV: 73.1 fl — AB (ref 78.0–100.0)
MONOS PCT: 7.5 % (ref 3.0–12.0)
Monocytes Absolute: 0.6 10*3/uL (ref 0.1–1.0)
Neutro Abs: 5.2 10*3/uL (ref 1.4–7.7)
Neutrophils Relative %: 69.2 % (ref 43.0–77.0)
Platelets: 302 10*3/uL (ref 150.0–400.0)
RBC: 4.62 Mil/uL (ref 4.22–5.81)
RDW: 21.1 % — ABNORMAL HIGH (ref 11.5–15.5)
WBC: 7.6 10*3/uL (ref 4.0–10.5)

## 2018-04-20 LAB — COMPREHENSIVE METABOLIC PANEL
ALBUMIN: 3.7 g/dL (ref 3.5–5.2)
ALK PHOS: 154 U/L — AB (ref 39–117)
ALT: 20 U/L (ref 0–53)
AST: 23 U/L (ref 0–37)
BILIRUBIN TOTAL: 0.6 mg/dL (ref 0.2–1.2)
BUN: 14 mg/dL (ref 6–23)
CALCIUM: 9.2 mg/dL (ref 8.4–10.5)
CO2: 27 mEq/L (ref 19–32)
Chloride: 102 mEq/L (ref 96–112)
Creatinine, Ser: 1.1 mg/dL (ref 0.40–1.50)
GFR: 83.64 mL/min (ref >60.00)
GLUCOSE: 244 mg/dL — AB (ref 70–99)
Potassium: 4 mEq/L (ref 3.5–5.1)
Sodium: 137 mEq/L (ref 135–145)
TOTAL PROTEIN: 7.6 g/dL (ref 6.0–8.3)

## 2018-04-20 LAB — LIPID PANEL
Cholesterol: 91 mg/dL (ref 0–200)
HDL: 39.5 mg/dL (ref >39.00)
LDL Cholesterol: 42 mg/dL (ref 0–99)
NONHDL: 51.7
Total CHOL/HDL Ratio: 2
Triglycerides: 50 mg/dL (ref 0.0–149.0)
VLDL: 10 mg/dL (ref 0.0–40.0)

## 2018-04-20 LAB — FERRITIN: Ferritin: 8.6 ng/mL — ABNORMAL LOW (ref 22.0–322.0)

## 2018-04-20 LAB — IBC PANEL
Iron: 32 ug/dL — ABNORMAL LOW (ref 42–165)
SATURATION RATIOS: 8.8 % — AB (ref 20.0–50.0)
Transferrin: 259 mg/dL (ref 212.0–360.0)

## 2018-04-20 LAB — HEMOGLOBIN A1C: HEMOGLOBIN A1C: 8.8 % — AB (ref 4.6–6.5)

## 2018-04-20 NOTE — Progress Notes (Signed)
PCP notes:   Health maintenance:  A1C - completed  Abnormal screenings:   Hearing - failed  Hearing Screening   125Hz  250Hz  500Hz  1000Hz  2000Hz  3000Hz  4000Hz  6000Hz  8000Hz   Right ear:   40 40 40  40    Left ear:   40 40 40  0     Fall risk - hx of multiple falls Fall Risk  04/20/2018 04/25/2017 04/17/2016 04/17/2016 02/20/2015  Falls in the past year? Yes Yes No No Yes  Number falls in past yr: 2 or more 2 or more - - 2 or more  Injury with Fall? Yes No - - No  Risk Factor Category  High Fall Risk High Fall Risk - - High Fall Risk  Risk for fall due to : - Impaired balance/gait;Impaired mobility - - Impaired balance/gait;Impaired mobility  Follow up - Falls prevention discussed - - Falls evaluation completed   Mini-Cog score: 18/20 MMSE - Mini Mental State Exam 04/20/2018 04/17/2016  Orientation to time 5 5  Orientation to Place 5 5  Registration 3 3  Attention/ Calculation 0 0  Recall 1 3  Recall-comments unable to recall 2 of 3 words -  Language- name 2 objects 0 0  Language- repeat 1 1  Language- follow 3 step command 3 3  Language- read & follow direction 0 0  Write a sentence 0 0  Copy design 0 0  Total score 18 20   Patient concerns:   Patient reports intermittent buttock pain after recent fall approx. 1 wk ago. Pain scale: 2/10. Patient declined acute visit with PCP.  Nurse concerns:  None  Next PCP appt:   04/29/2018 @ 1230  I reviewed health advisor's note, was available for consultation on the day of service listed in this note, and agree with documentation and plan. Crawford GivensGraham Duncan, MD.

## 2018-04-20 NOTE — Patient Instructions (Signed)
[name] , Thank you for taking time to come for your Medicare Wellness Visit. I appreciate your ongoing commitment to your health goals. Please review the following plan we discussed and let me know if I can assist you in the future.   These are the goals we discussed: Goals    . Patient Stated     Starting 04/20/2018, I will keep taking medication as prescribed.        This is a list of the screening recommended for you and due dates:  Health Maintenance  Topic Date Due  . DTaP/Tdap/Td vaccine (1 - Tdap) 01/27/2019*  . Flu Shot  06/18/2018  . Complete foot exam   08/15/2018  . Hemoglobin A1C  10/20/2018  . Tetanus Vaccine  01/27/2019  . Eye exam for diabetics  03/19/2019  . Pneumonia vaccines  Completed  *Topic was postponed. The date shown is not the original due date.   Preventive Care for Adults  A healthy lifestyle and preventive care can promote health and wellness. Preventive health guidelines for adults include the following key practices.  . A routine yearly physical is a good way to check with your health care provider about your health and preventive screening. It is a chance to share any concerns and updates on your health and to receive a thorough exam.  . Visit your dentist for a routine exam and preventive care every 6 months. Brush your teeth twice a day and floss once a day. Good oral hygiene prevents tooth decay and gum disease.  . The frequency of eye exams is based on your age, health, family medical history, use  of contact lenses, and other factors. Follow your health care provider's recommendations for frequency of eye exams.  . Eat a healthy diet. Foods like vegetables, fruits, whole grains, low-fat dairy products, and lean protein foods contain the nutrients you need without too many calories. Decrease your intake of foods high in solid fats, added sugars, and salt. Eat the right amount of calories for you. Get information about a proper diet from your  health care provider, if necessary.  . Regular physical exercise is one of the most important things you can do for your health. Most adults should get at least 150 minutes of moderate-intensity exercise (any activity that increases your heart rate and causes you to sweat) each week. In addition, most adults need muscle-strengthening exercises on 2 or more days a week.  Silver Sneakers may be a benefit available to you. To determine eligibility, you may visit the website: www.silversneakers.com or contact program at [phone] Mon-Fri between 8AM-8PM.   . Maintain a healthy weight. The body mass index (BMI) is a screening tool to identify possible weight problems. It provides an estimate of body fat based on height and weight. Your health care provider can find your BMI and can help you achieve or maintain a healthy weight.   For adults 20 years and older: ? A BMI below 18.5 is considered underweight. ? A BMI of 18.5 to 24.9 is normal. ? A BMI of 25 to 29.9 is considered overweight. ? A BMI of 30 and above is considered obese.   . Maintain normal blood lipids and cholesterol levels by exercising and minimizing your intake of saturated fat. Eat a balanced diet with plenty of fruit and vegetables. Blood tests for lipids and cholesterol should begin at age 76 and be repeated every 5 years. If your lipid or cholesterol levels are high, you are over 50,  or you are at high risk for heart disease, you may need your cholesterol levels checked more frequently. Ongoing high lipid and cholesterol levels should be treated with medicines if diet and exercise are not working.  . If you smoke, find out from your health care provider how to quit. If you do not use tobacco, please do not start.  . If you choose to drink alcohol, please do not consume more than 2 drinks per day. One drink is considered to be 12 ounces (355 mL) of beer, 5 ounces (148 mL) of wine, or 1.5 ounces (44 mL) of liquor.  . If you are  17-69 years old, ask your health care provider if you should take aspirin to prevent strokes.  . Use sunscreen. Apply sunscreen liberally and repeatedly throughout the day. You should seek shade when your shadow is shorter than you. Protect yourself by wearing long sleeves, pants, a wide-brimmed hat, and sunglasses year round, whenever you are outdoors.  . Once a month, do a whole body skin exam, using a mirror to look at the skin on your back. Tell your health care provider of new moles, moles that have irregular borders, moles that are larger than a pencil eraser, or moles that have changed in shape or color.

## 2018-04-20 NOTE — Progress Notes (Signed)
Subjective:   Marcus LankSamuel J Matsen Jr. is a 77 y.o. male who presents for Medicare Annual/Subsequent preventive examination.  Review of Systems:  N/A Cardiac Risk Factors include: advanced age (>4855men, 32>65 women);dyslipidemia;hypertension;male gender;diabetes mellitus     Objective:    Vitals: BP 138/74 (BP Location: Right Arm, Patient Position: Sitting, Cuff Size: Normal)   Pulse 96   Temp 99 F (37.2 C) (Oral)   Wt 206 lb (93.4 kg)   SpO2 95%   BMI 28.73 kg/m   Body mass index is 28.73 kg/m.  Advanced Directives 04/20/2018 04/17/2016 04/17/2016 09/07/2012  Does Patient Have a Medical Advance Directive? No No No Patient does not have advance directive  Would patient like information on creating a medical advance directive? Yes (MAU/Ambulatory/Procedural Areas - Information given) Yes - Educational materials given Yes - Transport plannerducational materials given -    Tobacco Social History   Tobacco Use  Smoking Status Former Smoker  . Packs/day: 1.00  . Years: 30.00  . Pack years: 30.00  . Types: Cigarettes  . Last attempt to quit: 09/07/1996  . Years since quitting: 21.6  Smokeless Tobacco Never Used  Tobacco Comment   quit after CVA     Counseling given: No Comment: quit after CVA   Clinical Intake:  Pre-visit preparation completed: Yes  Pain : 0-10 Pain Score: 2  Pain Type: Acute pain Pain Onset: In the past 7 days Pain Frequency: Intermittent     Nutritional Status: BMI 25 -29 Overweight Nutritional Risks: None Diabetes: Yes CBG done?: No Did pt. bring in CBG monitor from home?: No  How often do you need to have someone help you when you read instructions, pamphlets, or other written materials from your doctor or pharmacy?: 1 - Never What is the last grade level you completed in school?: 12th grade + 1 yr college  Interpreter Needed?: No  Comments: pt lives alone Information entered by :: LPinson, LPN  Past Medical History:  Diagnosis Date  . CRI (chronic renal  insufficiency)   . 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 Diabetes mellitus type II 1992  . HLD (hyperlipidemia) 10/1998  . HTN (hypertension) 10/1998  . Primary open angle glaucoma    [name]  . RBBB   . Stroke [facility](HCC)    Left sided weakness   Past Surgical History:  Procedure Laterality Date  . CATARACT EXTRACTION W/PHACO  09/09/2012   Procedure: CATARACT EXTRACTION PHACO AND INTRAOCULAR LENS PLACEMENT (IOC);  Surgeon: Chalmers Guestoy [name], MD;  [facility];  Service: Ophthalmology;  Laterality: Right;  . CATARACT EXTRACTION W/PHACO Left 03/31/2013   Procedure: CATARACT EXTRACTION PHACO AND INTRAOCULAR LENS PLACEMENT (IOC);  Surgeon: Chalmers Guestoy [name], MD;  [facility];  Service: Ophthalmology;  Laterality: Left;  . EYE SURGERY    . lipoma removal  1970's   Right shoulder  . US ECHOCARDIOGRAPHY  03/2013   mod LVH, EF 60%, normal wall motion   Family History  Problem Relation Age of Onset  . Heart attack Father [id number]  . Hypertension Sister        Multiple medical problems  . Diabetes Sister   . Stroke Neg Hx   . Cancer Neg Hx    Social History   Socioeconomic History  . Marital status: Divorced    Spouse name: Not on file  . Number of children: 2  . Years of education: Not on file  . Highest education level: Not on file  Occupational History  .  Occupation: Retired medically from [employer]  Social Needs  . Financial resource strain: Not on file  . Food insecurity:    Worry: Not on file    Inability: Not on file  . Transportation needs:    Medical: Not on file    Non-medical: Not on file  Tobacco Use  . Smoking status: Former Smoker    Packs/day: 1.00    Years: 30.00    Pack years: 30.00    Types: Cigarettes    Last attempt to quit: 09/07/1996    Years since quitting: 21.6  . Smokeless tobacco: Never Used  . Tobacco comment: quit after CVA  Substance and Sexual Activity  . Alcohol use: No  . Drug use: No  . Sexual activity: Not  Currently  Lifestyle  . Physical activity:    Days per week: Not on file    Minutes per session: Not on file  . Stress: Not on file  Relationships  . Social connections:    Talks on phone: Not on file    Gets together: Not on file    Attends religious service: Not on file    Active member of club or organization: Not on file    Attends meetings of clubs or organizations: Not on file    Relationship status: Not on file  Other Topics Concern  . Not on file  Social History Narrative   Caffeine: neg   Lives alone: friend comes and helps sometimes.  Family nearby - 2 sisters and nieces/nephews in area.      ophtho - Dr. Harlon Downs at [facility]    Outpatient Encounter Medications as of 04/20/2018  Medication Sig  . amLODipine-atorvastatin (CADUET) 10-40 MG tablet TAKE 1 TABLET BY MOUTH ONCE DAILY  . aspirin 325 MG EC tablet Take 325 mg by mouth daily. For stroke prevention    . diclofenac sodium (VOLTAREN) 1 % GEL Apply 1 application topically 3 (three) times daily as needed.  . diltiazem (TIAZAC) 360 MG 24 hr capsule TAKE 1 CAPSULE BY MOUTH ONCE DAILY  . glucose blood (EMBRACE BLOOD GLUCOSE TEST) test strip Check three times daily and when feeling ill E11.8 insulin dependent  . hydrochlorothiazide (HYDRODIURIL) 25 MG tablet Take 1 tablet (25 mg total) by mouth daily.  . insulin aspart protamine- aspart (NOVOLOG MIX 70/30) (70-30) 100 UNIT/ML injection Take 25 units with breakfast and 22 units with supper. QS 1 month  . latanoprost (XALATAN) 0.005 % ophthalmic solution Place 1 drop into both eyes at bedtime.  . Loperamide HCl (IMODIUM PO) Take 1 tablet by mouth as needed.   Marland Kitchen losartan (COZAAR) 100 MG tablet Take 1 tablet (100 mg total) by mouth daily.  Marland Kitchen losartan-hydrochlorothiazide (HYZAAR) 100-25 MG tablet TAKE 1 TABLET BY MOUTH ONCE DAILY  . metFORMIN (GLUCOPHAGE) 1000 MG tablet Take 1 tablet (1,000 mg total) by mouth 2 (two) times daily with a meal.  .  potassium chloride (K-DUR) 10 MEQ tablet TAKE THREE TABLETS BY MOUTH ONCE DAILY  . [DISCONTINUED] diltiazem (CARDIZEM CD) 360 MG 24 hr capsule Take 1 capsule (360 mg total) by mouth daily.   No facility-administered encounter medications on file as of 04/20/2018.     Activities of Daily Living In your present state of health, do you have any difficulty performing the following activities: 04/20/2018  Hearing? N  Vision? N  Difficulty concentrating or making decisions? N  Walking or climbing stairs? Y  Dressing or bathing? Y  Doing errands, shopping?  Y  Preparing Food and eating ? Y  Using the Toilet? N  In the past six months, have you accidently leaked urine? Y  Do you have problems with loss of bowel control? Y  Managing your Medications? Y  Managing your Finances? Y  Housekeeping or managing your Housekeeping? Y  Some recent data might be hidden    Patient Care Team: [name], MD as PCP - Levada Dy, MD as Consulting Physician (Ophthalmology)   Assessment:   This is a routine wellness examination for Damien.   Hearing Screening   125Hz  250Hz  500Hz  1000Hz  2000Hz  3000Hz  4000Hz  6000Hz  8000Hz   Right ear:   40 40 40  40    Left ear:   40 40 40  0    Vision Screening Comments: May 2019 with Dr. Harlon Downs   Exercise Activities and Dietary recommendations Current Exercise Habits: The patient does not participate in regular exercise at present, Exercise limited by: None identified  Goals    . Patient Stated     Starting 04/20/2018, I will keep taking medication as prescribed.        Fall Risk Fall Risk  04/20/2018 04/25/2017 04/17/2016 04/17/2016 02/20/2015  Falls in the past year? Yes Yes No No Yes  Number falls in past yr: 2 or more 2 or more - - 2 or more  Injury with Fall? Yes No - - No  Risk Factor Category  High Fall Risk High Fall Risk - - High Fall Risk  Risk for fall due to : - Impaired balance/gait;Impaired mobility - - Impaired balance/gait;Impaired  mobility  Follow up - Falls prevention discussed - - Falls evaluation completed   Depression Screen PHQ 2/9 Scores 04/20/2018 04/25/2017 04/17/2016 04/17/2016  PHQ - 2 Score 0 0 0 0  PHQ- 9 Score 0 - - -    Cognitive Function MMSE - Mini Mental State Exam 04/20/2018 04/17/2016  Orientation to time 5 5  Orientation to Place 5 5  Registration 3 3  Attention/ Calculation 0 0  Recall 1 3  Recall-comments unable to recall 2 of 3 words -  Language- name 2 objects 0 0  Language- repeat 1 1  Language- follow 3 step command 3 3  Language- read & follow direction 0 0  Write a sentence 0 0  Copy design 0 0  Total score 18 20     PLEASE NOTE: A Mini-Cog screen was completed. Maximum score is 20. A value of 0 denotes this part of Folstein MMSE was not completed or the patient failed this part of the Mini-Cog screening.   Mini-Cog Screening Orientation to Time - Max 5 pts Orientation to Place - Max 5 pts Registration - Max 3 pts Recall - Max 3 pts Language Repeat - Max 1 pts Language Follow 3 Step Command - Max 3 pts     Immunization History  Administered Date(s) Administered  . Influenza Split 09/01/2012  . Influenza Whole 09/18/2002, 08/27/2007, 08/29/2008, 09/10/2010  . Influenza,inj,Quad PF,6+ Mos 07/28/2013, 10/17/2014, 08/21/2015, 10/18/2016, 08/15/2017  . Pneumococcal Conjugate-13 06/10/2014  . Pneumococcal Polysaccharide-23 08/18/1996, 06/19/2010  . Td 10/31/1998, 01/26/2009  . Zoster 09/01/2012    Screening Tests Health Maintenance  Topic Date Due  . DTaP/Tdap/Td (1 - Tdap) 01/27/2019 (Originally 01/27/2009)  . INFLUENZA VACCINE  06/18/2018  . FOOT EXAM  08/15/2018  . HEMOGLOBIN A1C  10/20/2018  . TETANUS/TDAP  01/27/2019  . OPHTHALMOLOGY EXAM  03/19/2019  . PNA vac Low Risk Adult  Completed  Plan:     I have personally reviewed, addressed, and noted the following in the patient's chart:  A. Medical and social history B. Use of alcohol, tobacco or illicit drugs    C. Current medications and supplements D. Functional ability and status E.  Nutritional status F.  Physical activity G. Advance directives H. List of other physicians I.  Hospitalizations, surgeries, and ER visits in previous 12 months J.  Vitals K. Screenings to include hearing, vision, cognitive, depression L. Referrals and appointments - none  In addition, I have reviewed and discussed with patient certain preventive protocols, quality metrics, and best practice recommendations. A written personalized care plan for preventive services as well as general preventive health recommendations were provided to patient.  See attached scanned questionnaire for additional information.   Signed,   Randa Evens, MHA, BS, LPN Health Coach

## 2018-04-25 ENCOUNTER — Other Ambulatory Visit: Payer: Self-pay | Admitting: Family Medicine

## 2018-04-27 ENCOUNTER — Ambulatory Visit: Payer: Medicare Other

## 2018-04-27 ENCOUNTER — Encounter: Payer: Medicare Other | Admitting: Family Medicine

## 2018-04-29 ENCOUNTER — Ambulatory Visit (INDEPENDENT_AMBULATORY_CARE_PROVIDER_SITE_OTHER): Payer: Medicare Other | Admitting: Family Medicine

## 2018-04-29 ENCOUNTER — Encounter: Payer: Self-pay | Admitting: Family Medicine

## 2018-04-29 VITALS — BP 136/60 | HR 79 | Temp 97.8°F | Ht 71.0 in | Wt 209.2 lb

## 2018-04-29 DIAGNOSIS — Z1211 Encounter for screening for malignant neoplasm of colon: Secondary | ICD-10-CM

## 2018-04-29 DIAGNOSIS — I10 Essential (primary) hypertension: Secondary | ICD-10-CM

## 2018-04-29 DIAGNOSIS — W19XXXD Unspecified fall, subsequent encounter: Secondary | ICD-10-CM | POA: Diagnosis not present

## 2018-04-29 DIAGNOSIS — E118 Type 2 diabetes mellitus with unspecified complications: Secondary | ICD-10-CM | POA: Diagnosis not present

## 2018-04-29 DIAGNOSIS — D509 Iron deficiency anemia, unspecified: Secondary | ICD-10-CM | POA: Diagnosis not present

## 2018-04-29 DIAGNOSIS — E1165 Type 2 diabetes mellitus with hyperglycemia: Secondary | ICD-10-CM

## 2018-04-29 DIAGNOSIS — M25562 Pain in left knee: Secondary | ICD-10-CM

## 2018-04-29 DIAGNOSIS — I69954 Hemiplegia and hemiparesis following unspecified cerebrovascular disease affecting left non-dominant side: Secondary | ICD-10-CM | POA: Diagnosis not present

## 2018-04-29 DIAGNOSIS — IMO0002 Reserved for concepts with insufficient information to code with codable children: Secondary | ICD-10-CM

## 2018-04-29 DIAGNOSIS — G8929 Other chronic pain: Secondary | ICD-10-CM

## 2018-04-29 DIAGNOSIS — E785 Hyperlipidemia, unspecified: Secondary | ICD-10-CM | POA: Diagnosis not present

## 2018-04-29 MED ORDER — METFORMIN HCL 850 MG PO TABS: 850.0000 mg | ORAL_TABLET | Freq: Two times a day (BID) | ORAL | 3 refills | Status: DC

## 2018-04-29 MED ORDER — DICLOFENAC SODIUM 1 % TD GEL: 1.0000 "application " | Freq: Three times a day (TID) | TRANSDERMAL | 3 refills | Status: DC | PRN

## 2018-04-29 MED ORDER — METFORMIN HCL ER 750 MG PO TB24: 750.0000 mg | ORAL_TABLET | Freq: Every day | ORAL | 11 refills | Status: DC

## 2018-04-29 MED ORDER — INSULIN ASPART PROT & ASPART (70-30 MIX) 100 UNIT/ML ~~LOC~~ SUSP: SUBCUTANEOUS | 11 refills | Status: DC

## 2018-04-29 NOTE — Assessment & Plan Note (Signed)
New, unclear source. He is on full dose aspirin after prior CVA. Has previously declined colon cancer screening, unable to complete iFOB at home due to hemiparesis (lives alone). Discussed implications of new IDA, including need to r/o colon cancer, recommended further eval by GI. Pt initially very hesitant to have colonoscopy, agrees to GI referral to further discuss.

## 2018-04-29 NOTE — Assessment & Plan Note (Signed)
Chronic, stable. Continue current regimen. 

## 2018-04-29 NOTE — Patient Instructions (Addendum)
Continue novolog 70/30 current dose  Trial metformin extended release 750mg  twice daily in place of regular metformin.  For new iron deficiency anemia I will refer you to stomach doctor to discuss options.  Return in 4 months for follow up visit. See our referral coordinators on your way out

## 2018-04-29 NOTE — Assessment & Plan Note (Signed)
Ongoing - high fall risk due to L hemiparesis.

## 2018-04-29 NOTE — Progress Notes (Signed)
BP 136/60 (BP Location: Right Arm, Patient Position: Sitting, Cuff Size: Large)   Pulse 79   Temp 97.8 F (36.6 C) (Oral)   Ht 5\' 11"  (1.803 m)   Wt 209 lb 4 oz (94.9 kg)   SpO2 95%   BMI 29.18 kg/m    CC: AMW f/u visit Subjective:    Patient ID: Marcus Lank., male    DOB: 1941/08/14, 77 y.o.   MRN: [id number]  HPI: Marcus Schank. is a 77 y.o. male presenting on 04/29/2018 for Annual Exam (Pt 2. Pt provided recent BS readings.)   Son's brother in law helps care for him (xray tech).   Saw Lesia last week for medicare wellness visit. Note reviewed. 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 Fell onto R buttock. Used life alert to call for assistance.   HTN - hyzaar was on back order - was split into individual components 11/2017.   DM - brings sugar log which was reviewed - last check in log 02/04/2018 - 150-300s. Higher metformin worsens loose stools. Nurse aid continues checking cbg's at home and writes on calendar - comes MWF. Reports compliance with novolog 70/30 using vial, last filled 10/2016 - thinks this may be over the counter.   Requests voltaren gel refill - previously used for L knee pain, thinks it may also help neck pain.   Preventative: Colon cancer screening - never had colonoscopy. Declines iFOB as well - difficulty completing 2/2 hemiparesis. In office hemoccult today.  Requests prostate check yearly. Brother in law died from prostate cancer. Discussed stopping screening - but may continue for colon cancer screening as per above  Flu yearly  Td 2010  Pneumovax 2011, prevnar 05/2014  zostavax - 08/2012  Advanced directives - would want sister, Marcus Downs to be HCPOA. Advanced packet previously provided. Would like nurse to come out to house to review info with him.  Seat belt use discussed.  Sunscreen use discussed. No changing moles on skin.  Ex smoker - quit 1997 Alcohol - none  Caffeine: neg Lives alone.  Friend comes and helps sometimes. Son's brother in law helps with medicines. Family nearby - 2 sisters and nieces/nephews in area. Activity:  Diet: good water, fruits/vegetables daily  Relevant past medical, surgical, family and social history reviewed and updated as indicated. Interim medical history since our last visit reviewed. Allergies and medications reviewed and updated. Outpatient Medications Prior to Visit  Medication Sig Dispense Refill  . amLODipine-atorvastatin (CADUET) 10-40 MG tablet TAKE 1 TABLET BY MOUTH ONCE DAILY 90 tablet 1  . aspirin 325 MG EC tablet Take 325 mg by mouth daily. For stroke prevention      . diltiazem (TIAZAC) 360 MG 24 hr capsule TAKE 1 CAPSULE BY MOUTH ONCE DAILY 90 capsule 2  . glucose blood (EMBRACE BLOOD GLUCOSE TEST) test strip Check three times daily and when feeling ill E11.8 insulin dependent 100 each 12  . hydrochlorothiazide (HYDRODIURIL) 25 MG tablet Take 1 tablet (25 mg total) by mouth daily. 90 tablet 1  . latanoprost (XALATAN) 0.005 % ophthalmic solution Place 1 drop into both eyes at bedtime.    . Loperamide HCl (IMODIUM PO) Take 1 tablet by mouth as needed.     Marland Kitchen losartan (COZAAR) 100 MG tablet Take 1 tablet (100 mg total) by mouth daily. 90 tablet 1  . potassium chloride (K-DUR) 10 MEQ tablet TAKE THREE TABLETS BY MOUTH ONCE DAILY 270  tablet 3  . diclofenac sodium (VOLTAREN) 1 % GEL Apply 1 application topically 3 (three) times daily as needed. 1 Tube 3  . insulin aspart protamine- aspart (NOVOLOG MIX 70/30) (70-30) 100 UNIT/ML injection Take 25 units with breakfast and 22 units with supper. QS 1 month 30 mL 11  . losartan-hydrochlorothiazide (HYZAAR) 100-25 MG tablet TAKE 1 TABLET BY MOUTH ONCE DAILY 90 tablet 1  . metFORMIN (GLUCOPHAGE) 1000 MG tablet Take 1 tablet (1,000 mg total) by mouth 2 (two) times daily with a meal. 180 tablet 3  . metFORMIN (GLUCOPHAGE) 850 MG tablet TAKE ONE TABLET BY MOUTH TWICE DAILY WITH A MEAL 180 tablet 0    No facility-administered medications prior to visit.      Per HPI unless specifically indicated in ROS section below Review of Systems     Objective:    BP 136/60 (BP Location: Right Arm, Patient Position: Sitting, Cuff Size: Large)   Pulse 79   Temp 97.8 F (36.6 C) (Oral)   Ht 5\' 11"  (1.803 m)   Wt 209 lb 4 oz (94.9 kg)   SpO2 95%   BMI 29.18 kg/m   Wt Readings from Last 3 Encounters:  04/29/18 209 lb 4 oz (94.9 kg)  04/20/18 206 lb (93.4 kg)  12/12/17 206 lb (93.4 kg)    Physical Exam  Constitutional: He appears well-developed and well-nourished. No distress.  In wheelchair, walks with walk cane (hemi-walker)  HENT:  Head: Normocephalic and atraumatic.  Right Ear: Hearing, tympanic membrane, external ear and ear canal normal.  Left Ear: Hearing, tympanic membrane, external ear and ear canal normal.  Nose: Nose normal.  Mouth/Throat: Uvula is midline, oropharynx is clear and moist and mucous membranes are normal. No oropharyngeal exudate, posterior oropharyngeal edema or posterior oropharyngeal erythema.  Eyes: Pupils are equal, round, and reactive to light. Conjunctivae and EOM are normal. No scleral icterus.  Neck: Normal range of motion. Neck supple.  Cardiovascular: Normal rate, regular rhythm, normal heart sounds and intact distal pulses.  No murmur heard. Pulses:      Radial pulses are 2+ on the right side, and 2+ on the left side.  Pulmonary/Chest: Effort normal and breath sounds normal. No respiratory distress. He has no wheezes. He has no rales.  Abdominal: Soft. Bowel sounds are normal. He exhibits no distension and no mass. There is no tenderness. There is no rebound and no guarding.  Musculoskeletal: Normal range of motion. He exhibits no edema.  Lymphadenopathy:    He has no cervical adenopathy.  Neurological: He is alert.  Chronic L hemiparesis  Skin: Skin is warm and dry. No rash noted.  Psychiatric: He has a normal mood and affect. His behavior is  normal. Judgment and thought content normal.  Nursing note and vitals reviewed.  Results for orders placed or performed in visit on 04/20/18  Ferritin  Result Value Ref Range   Ferritin 8.6 (L) 22.0 - 322.0 ng/mL  IBC panel  Result Value Ref Range   Iron 32 (L) 42 - 165 ug/dL   Transferrin 161.0 960.4 - 360.0 mg/dL   Saturation Ratios 8.8 (L) 20.0 - 50.0 %  CBC with Differential/Platelet  Result Value Ref Range   WBC 7.6 4.0 - 10.5 K/uL   RBC 4.62 4.22 - 5.81 Mil/uL   Hemoglobin 11.0 (L) 13.0 - 17.0 g/dL   HCT 54.0 (L) 98.1 - 19.1 %   MCV 73.1 (L) 78.0 - 100.0 fl   MCHC 32.6 30.0 - 36.0 g/dL  RDW 21.1 (H) 11.5 - 15.5 %   Platelets 302.0 150.0 - 400.0 K/uL   Neutrophils Relative % 69.2 43.0 - 77.0 %   Lymphocytes Relative 19.8 12.0 - 46.0 %   Monocytes Relative 7.5 3.0 - 12.0 %   Eosinophils Relative 2.1 0.0 - 5.0 %   Basophils Relative 1.4 0.0 - 3.0 %   Neutro Abs 5.2 1.4 - 7.7 K/uL   Lymphs Abs 1.5 0.7 - 4.0 K/uL   Monocytes Absolute 0.6 0.1 - 1.0 K/uL   Eosinophils Absolute 0.2 0.0 - 0.7 K/uL   Basophils Absolute 0.1 0.0 - 0.1 K/uL  Hemoglobin A1c  Result Value Ref Range   Hgb A1c MFr Bld 8.8 (H) 4.6 - 6.5 %  Comprehensive metabolic panel  Result Value Ref Range   Sodium 137 135 - 145 mEq/L   Potassium 4.0 3.5 - 5.1 mEq/L   Chloride 102 96 - 112 mEq/L   CO2 27 19 - 32 mEq/L   Glucose, Bld 244 (H) 70 - 99 mg/dL   BUN 14 6 - 23 mg/dL   Creatinine, Ser 1.611.10 0.40 - 1.50 mg/dL   Total Bilirubin 0.6 0.2 - 1.2 mg/dL   Alkaline Phosphatase 154 (H) 39 - 117 U/L   AST 23 0 - 37 U/L   ALT 20 0 - 53 U/L   Total Protein 7.6 6.0 - 8.3 g/dL   Albumin 3.7 3.5 - 5.2 g/dL   Calcium 9.2 8.4 - 09.610.5 mg/dL   GFR 04.5483.64 >09.81>60.00 mL/min  Lipid panel  Result Value Ref Range   Cholesterol 91 0 - 200 mg/dL   Triglycerides 19.150.0 0.0 - 149.0 mg/dL   HDL 47.8239.50 >95.62>39.00 mg/dL   VLDL 13.010.0 0.0 - 86.540.0 mg/dL   LDL Cholesterol 42 0 - 99 mg/dL   Total CHOL/HDL Ratio 2    NonHDL 51.70    Lab  Results  Component Value Date   PSA 2.33 04/21/2017   PSA 1.99 02/20/2015   PSA 1.98 05/25/2012       Assessment & Plan:   Problem List Items Addressed This Visit    Iron deficiency anemia - Primary    New, unclear source. He is on full dose aspirin after prior CVA. Has previously declined colon cancer screening, unable to complete iFOB at home due to hemiparesis (lives alone). Discussed implications of new IDA, including need to r/o colon cancer, recommended further eval by GI. Pt initially very hesitant to have colonoscopy, agrees to GI referral to further discuss.       HLD (hyperlipidemia)    Chronic, stable. Continue caduet.  The ASCVD Risk score Denman George(Goff DC Jr., et al., 2013) failed to calculate for the following reasons:   The valid total cholesterol range is 130 to 320 mg/dL       Hemiplegia, late effect of cerebrovascular disease (HCC)   Falls    Ongoing - high fall risk due to L hemiparesis.       Essential hypertension    Chronic, stable. Continue current regimen.       Diabetes mellitus type 2, uncontrolled, with complications (HCC)    Chronic, above goal (<8%). Difficulty titrating medications due to inability to check cbg's consistently due to hemiapresis, relies on nurse aid to check 3 times a week.  Endorses compliance with novolog 70/30, last filled 10/2016 ?using OTC formulation - will refill today.  For ongoing GI upset/loose stools from metformin - will trial extended release metformin XR 750mg  BID.  Relevant Medications   metFORMIN (GLUCOPHAGE XR) 750 MG 24 hr tablet   insulin aspart protamine- aspart (NOVOLOG MIX 70/30) (70-30) 100 UNIT/ML injection    Other Visit Diagnoses    Special screening for malignant neoplasms, colon       Relevant Orders   Ambulatory referral to Gastroenterology   Chronic pain of left knee           Meds ordered this encounter  Medications  . DISCONTD: metFORMIN (GLUCOPHAGE) 850 MG tablet    Sig: Take 1 tablet (850 mg  total) by mouth 2 (two) times daily with a meal.    Dispense:  180 tablet    Refill:  3  . diclofenac sodium (VOLTAREN) 1 % GEL    Sig: Apply 1 application topically 3 (three) times daily as needed.    Dispense:  1 Tube    Refill:  3  . DISCONTD: metFORMIN (GLUCOPHAGE XR) 750 MG 24 hr tablet    Sig: Take 1 tablet (750 mg total) by mouth daily with breakfast.    Dispense:  60 tablet    Refill:  11  . metFORMIN (GLUCOPHAGE XR) 750 MG 24 hr tablet    Sig: Take 1 tablet (750 mg total) by mouth daily with breakfast.    Dispense:  60 tablet    Refill:  11    Trial extended release metformin in place of IR formulation  . insulin aspart protamine- aspart (NOVOLOG MIX 70/30) (70-30) 100 UNIT/ML injection    Sig: Take 25 units with breakfast and 22 units with supper. QS 1 month    Dispense:  20 mL    Refill:  11   Orders Placed This Encounter  Procedures  . Ambulatory referral to Gastroenterology    Referral Priority:   Routine    Referral Type:   Consultation    Referral Reason:   Specialty Services Required    Number of Visits Requested:   1    Follow up plan: Return in about 4 months (around 08/29/2018).  [name], MD

## 2018-04-29 NOTE — Assessment & Plan Note (Signed)
Chronic, stable. Continue caduet.  The ASCVD Risk score Denman George(Goff DC Jr., et al., 2013) failed to calculate for the following reasons:   The valid total cholesterol range is 130 to 320 mg/dL

## 2018-04-29 NOTE — Assessment & Plan Note (Signed)
Chronic, above goal (<8%). Difficulty titrating medications due to inability to check cbg's consistently due to hemiapresis, relies on nurse aid to check 3 times a week.  Endorses compliance with novolog 70/30, last filled 10/2016 ?using OTC formulation - will refill today.  For ongoing GI upset/loose stools from metformin - will trial extended release metformin XR 750mg  BID.

## 2018-04-30 ENCOUNTER — Telehealth: Payer: Self-pay

## 2018-04-30 NOTE — Telephone Encounter (Signed)
PA started for diclofenac 1 % gel, key:  [id number]  [id number]. Decision pending.

## 2018-04-30 NOTE — Telephone Encounter (Signed)
Received PA approval effective 5/14/201-- 04/29/2021.  Made [facility] aware. Says they will get it filled for pt.

## 2018-05-04 ENCOUNTER — Encounter: Payer: Medicare Other | Admitting: Family Medicine

## 2018-06-04 ENCOUNTER — Other Ambulatory Visit: Payer: Self-pay | Admitting: Family Medicine

## 2018-06-19 ENCOUNTER — Other Ambulatory Visit: Payer: Self-pay | Admitting: Family Medicine

## 2018-08-31 ENCOUNTER — Encounter: Payer: Self-pay | Admitting: Family Medicine

## 2018-08-31 ENCOUNTER — Ambulatory Visit (INDEPENDENT_AMBULATORY_CARE_PROVIDER_SITE_OTHER): Payer: Medicare Other | Admitting: Family Medicine

## 2018-08-31 VITALS — BP 140/50 | HR 82 | Temp 98.5°F | Ht 71.0 in | Wt 201.5 lb

## 2018-08-31 DIAGNOSIS — I69954 Hemiplegia and hemiparesis following unspecified cerebrovascular disease affecting left non-dominant side: Secondary | ICD-10-CM | POA: Diagnosis not present

## 2018-08-31 DIAGNOSIS — E118 Type 2 diabetes mellitus with unspecified complications: Secondary | ICD-10-CM

## 2018-08-31 DIAGNOSIS — N289 Disorder of kidney and ureter, unspecified: Secondary | ICD-10-CM

## 2018-08-31 DIAGNOSIS — E1165 Type 2 diabetes mellitus with hyperglycemia: Secondary | ICD-10-CM | POA: Diagnosis not present

## 2018-08-31 DIAGNOSIS — IMO0002 Reserved for concepts with insufficient information to code with codable children: Secondary | ICD-10-CM

## 2018-08-31 DIAGNOSIS — I10 Essential (primary) hypertension: Secondary | ICD-10-CM

## 2018-08-31 DIAGNOSIS — D509 Iron deficiency anemia, unspecified: Secondary | ICD-10-CM

## 2018-08-31 LAB — CBC WITH DIFFERENTIAL/PLATELET
Basophils Relative: 0.8 % (ref 0.0–3.0)
EOS PCT: 1.5 % (ref 0.0–5.0)
HCT: 34.6 % — ABNORMAL LOW (ref 39.0–52.0)
HEMOGLOBIN: 11.1 g/dL — AB (ref 13.0–17.0)
LYMPHS PCT: 29.5 % (ref 12.0–46.0)
MCHC: 32.1 g/dL (ref 30.0–36.0)
MCV: 70.9 fl — ABNORMAL LOW (ref 78.0–100.0)
MONOS PCT: 7 % (ref 3.0–12.0)
NEUTROS PCT: 61.8 % (ref 43.0–77.0)
Platelets: 355 10*3/uL (ref 150.0–400.0)
RBC: 4.88 Mil/uL (ref 4.22–5.81)
RDW: 21.2 % — AB (ref 11.5–15.5)
WBC: 8.9 10*3/uL (ref 4.0–10.5)

## 2018-08-31 LAB — POCT GLYCOSYLATED HEMOGLOBIN (HGB A1C): Hemoglobin A1C: 8 % — AB (ref 4.0–5.6)

## 2018-08-31 LAB — BASIC METABOLIC PANEL
BUN: 16 mg/dL (ref 6–23)
CHLORIDE: 101 meq/L (ref 96–112)
CO2: 30 mEq/L (ref 19–32)
CREATININE: 1.11 mg/dL (ref 0.40–1.50)
Calcium: 9.7 mg/dL (ref 8.4–10.5)
GFR: 82.7 mL/min (ref >60.00)
Glucose, Bld: 186 mg/dL — ABNORMAL HIGH (ref 70–99)
Potassium: 4.1 mEq/L (ref 3.5–5.1)
SODIUM: 138 meq/L (ref 135–145)

## 2018-08-31 NOTE — Assessment & Plan Note (Addendum)
Chronic, improved control recently. Congratulated.  Continue current regimen. Will refer to podiatry.

## 2018-08-31 NOTE — Assessment & Plan Note (Addendum)
Again reviewed with patient, encouraged GI evaluation or at least stool test for hidden blood - he declines. Will recheck CBC. rec start iron OTC QOD.

## 2018-08-31 NOTE — Assessment & Plan Note (Signed)
Chronic, stable. Improved on recheck. Continue current regimen.

## 2018-08-31 NOTE — Patient Instructions (Addendum)
Flu shot today Blood work today. Let us know when running out of losartan and hydrochlorothiazide -and I will send in combo pill. Blood pressure is doing ok today. Consider talking to GI doctors about anemia.  Start over the counter iron every other day.

## 2018-08-31 NOTE — Progress Notes (Signed)
BP (!) 140/50 (BP Location: Right Arm, Cuff Size: Large)   Pulse 82   Temp 98.5 F (36.9 C) (Oral)   Ht 5\' 11"  (1.803 m)   Wt 201 lb 8 oz (91.4 kg)   SpO2 97%   BMI 28.10 kg/m    CC: diabetes f/u visit Subjective:    Patient ID: Marcus Lank., male    DOB: 1941/08/06, 77 y.o.   MRN: [id number]  HPI: Marcus Ropp. is a 77 y.o. male presenting on 08/31/2018 for Diabetes (Here for 4 mo f/u. )   Here with caregiver today   HTN - Compliant with current antihypertensive regimen of losartan 100mg , hctz 25mg , dilt 360mg  daily, and amlodipine 10mg  daily. Does not check blood pressures at home.  No low blood pressure readings or symptoms of dizziness/syncope.  Denies HA, vision changes, CP/tightness, SOB, leg swelling.    DM - aide helps him with sugars - MWF 2 hours after breakfast postprandial - doesn't know recent readings but thinks they're good - 160s. Compliant with antihyperglycemic regimen which includes: novolog 70/30 25/22 units, metformin XR 750mg  daily. Denies low sugars or hypoglycemic symptoms. Denies paresthesias. Last diabetic eye exam 03/2018. Pneumovax: 2011. Prevnar: 2015. Glucometer brand: unsure. DSME: not done. Lab Results  Component Value Date   HGBA1C 8.0 (A) 08/31/2018   Diabetic Foot Exam - Simple   Simple Foot Form Diabetic Foot exam was performed with the following findings:  Yes 08/31/2018  1:23 PM  Visual Inspection See comments:  Yes Sensation Testing Intact to touch and monofilament testing bilaterally:  Yes Pulse Check Posterior Tibialis and Dorsalis pulse intact bilaterally:  Yes Comments Thickened elongated onychomycotic nails throughout Thickened skin of feet    Lab Results  Component Value Date   MICROALBUR 8.2 (H) 09/01/2012     Relevant past medical, surgical, family and social history reviewed and updated as indicated. Interim medical history since our last visit reviewed. Allergies and medications reviewed and updated. Outpatient  Medications Prior to Visit  Medication Sig Dispense Refill  . amLODipine-atorvastatin (CADUET) 10-40 MG tablet TAKE 1 TABLET BY MOUTH ONCE DAILY 90 tablet 1  . aspirin 325 MG EC tablet Take 325 mg by mouth daily. For stroke prevention      . diclofenac sodium (VOLTAREN) 1 % GEL Apply 1 application topically 3 (three) times daily as needed. 1 Tube 3  . diltiazem (TIAZAC) 360 MG 24 hr capsule TAKE 1 CAPSULE BY MOUTH ONCE DAILY 90 capsule 2  . glucose blood (EMBRACE BLOOD GLUCOSE TEST) test strip Check three times daily and when feeling ill E11.8 insulin dependent 100 each 12  . hydrochlorothiazide (HYDRODIURIL) 25 MG tablet TAKE 1 TABLET BY MOUTH ONCE DAILY 90 tablet 1  . insulin aspart protamine- aspart (NOVOLOG MIX 70/30) (70-30) 100 UNIT/ML injection Take 25 units with breakfast and 22 units with supper. QS 1 month 20 mL 11  . latanoprost (XALATAN) 0.005 % ophthalmic solution Place 1 drop into both eyes at bedtime.    . Loperamide HCl (IMODIUM PO) Take 1 tablet by mouth as needed.     Marland Kitchen losartan (COZAAR) 100 MG tablet TAKE 1 TABLET BY MOUTH ONCE DAILY 90 tablet 1  . metFORMIN (GLUCOPHAGE XR) 750 MG 24 hr tablet Take 1 tablet (750 mg total) by mouth daily with breakfast. 60 tablet 11  . potassium chloride (K-DUR) 10 MEQ tablet TAKE 3 TABLETS BY MOUTH ONCE DAILY 270 tablet 3  . ferrous sulfate 325 (65 FE) MG  tablet Take 1 tablet (325 mg total) by mouth every other day.  3   No facility-administered medications prior to visit.      Per HPI unless specifically indicated in ROS section below Review of Systems     Objective:    BP (!) 140/50 (BP Location: Right Arm, Cuff Size: Large)   Pulse 82   Temp 98.5 F (36.9 C) (Oral)   Ht 5\' 11"  (1.803 m)   Wt 201 lb 8 oz (91.4 kg)   SpO2 97%   BMI 28.10 kg/m   Wt Readings from Last 3 Encounters:  08/31/18 201 lb 8 oz (91.4 kg)  04/29/18 209 lb 4 oz (94.9 kg)  04/20/18 206 lb (93.4 kg)    Physical Exam  Constitutional: He appears  well-developed and well-nourished. No distress.  HENT:  Head: Normocephalic and atraumatic.  Right Ear: External ear normal.  Left Ear: External ear normal.  Nose: Nose normal.  Mouth/Throat: Oropharynx is clear and moist. No oropharyngeal exudate.  Eyes: Pupils are equal, round, and reactive to light. Conjunctivae and EOM are normal. No scleral icterus.  Neck: Normal range of motion. Neck supple.  Cardiovascular: Normal rate, regular rhythm, normal heart sounds and intact distal pulses.  No murmur heard. Pulmonary/Chest: Effort normal and breath sounds normal. No respiratory distress. He has no wheezes. He has no rales.  Musculoskeletal: He exhibits no edema.  See HPI for foot exam if done  Lymphadenopathy:    He has no cervical adenopathy.  Neurological:  Persistent L hemiplegia L foot drop brace in place  Skin: Skin is warm and dry. No rash noted.  Psychiatric: He has a normal mood and affect.  Nursing note and vitals reviewed.  Results for orders placed or performed in visit on 08/31/18  POCT glycosylated hemoglobin (Hb A1C)  Result Value Ref Range   Hemoglobin A1C 8.0 (A) 4.0 - 5.6 %   HbA1c POC (<> result, manual entry)     HbA1c, POC (prediabetic range)     HbA1c, POC (controlled diabetic range)        Assessment & Plan:   Problem List Items Addressed This Visit    Renal insufficiency   Relevant Orders   Basic metabolic panel   Iron deficiency anemia    Again reviewed with patient, encouraged GI evaluation or at least stool test for hidden blood - he declines. Will recheck CBC. rec start iron OTC QOD.       Relevant Medications   ferrous sulfate 325 (65 FE) MG tablet   Other Relevant Orders   CBC with Differential/Platelet   Hemiplegia, late effect of cerebrovascular disease (HCC)   Essential hypertension    Chronic, stable. Improved on recheck. Continue current regimen.       Diabetes mellitus type 2, uncontrolled, with complications (HCC) - Primary     Chronic, improved control recently. Congratulated.  Continue current regimen. Will refer to podiatry.      Relevant Orders   POCT glycosylated hemoglobin (Hb A1C) (Completed)       No orders of the defined types were placed in this encounter.  Orders Placed This Encounter  Procedures  . CBC with Differential/Platelet  . Basic metabolic panel  . POCT glycosylated hemoglobin (Hb A1C)    Follow up plan: Return in about 4 months (around 01/01/2019), or if symptoms worsen or fail to improve.  [name], MD

## 2018-10-18 ENCOUNTER — Other Ambulatory Visit: Payer: Self-pay

## 2018-10-18 ENCOUNTER — Encounter (HOSPITAL_COMMUNITY): Payer: Self-pay | Admitting: Emergency Medicine

## 2018-10-18 ENCOUNTER — Emergency Department (HOSPITAL_COMMUNITY)
Admission: EM | Admit: 2018-10-18 | Discharge: 2018-10-18 | Disposition: A | Discharge status: Home / Self Care | Payer: Medicare Other | Attending: Emergency Medicine | Admitting: Emergency Medicine

## 2018-10-18 DIAGNOSIS — Z79899 Other long term (current) drug therapy: Secondary | ICD-10-CM | POA: Insufficient documentation

## 2018-10-18 DIAGNOSIS — E1165 Type 2 diabetes mellitus with hyperglycemia: Secondary | ICD-10-CM | POA: Diagnosis not present

## 2018-10-18 DIAGNOSIS — E785 Hyperlipidemia, unspecified: Secondary | ICD-10-CM | POA: Insufficient documentation

## 2018-10-18 DIAGNOSIS — Z8673 Personal history of transient ischemic attack (TIA), and cerebral infarction without residual deficits: Secondary | ICD-10-CM | POA: Insufficient documentation

## 2018-10-18 DIAGNOSIS — I10 Essential (primary) hypertension: Secondary | ICD-10-CM | POA: Insufficient documentation

## 2018-10-18 DIAGNOSIS — Z794 Long term (current) use of insulin: Secondary | ICD-10-CM | POA: Insufficient documentation

## 2018-10-18 DIAGNOSIS — R42 Dizziness and giddiness: Secondary | ICD-10-CM | POA: Diagnosis not present

## 2018-10-18 DIAGNOSIS — Z7982 Long term (current) use of aspirin: Secondary | ICD-10-CM | POA: Insufficient documentation

## 2018-10-18 DIAGNOSIS — I4510 Unspecified right bundle-branch block: Secondary | ICD-10-CM | POA: Diagnosis not present

## 2018-10-18 DIAGNOSIS — Z87891 Personal history of nicotine dependence: Secondary | ICD-10-CM | POA: Insufficient documentation

## 2018-10-18 DIAGNOSIS — R531 Weakness: Secondary | ICD-10-CM | POA: Insufficient documentation

## 2018-10-18 DIAGNOSIS — R000 Tachycardia, unspecified: Secondary | ICD-10-CM | POA: Diagnosis not present

## 2018-10-18 DIAGNOSIS — G8190 Hemiplegia, unspecified affecting unspecified side: Secondary | ICD-10-CM | POA: Diagnosis not present

## 2018-10-18 LAB — I-STAT CHEM 8, ED
BUN: 15 mg/dL (ref 8–23)
CALCIUM ION: 1.13 mmol/L — AB (ref 1.15–1.40)
CHLORIDE: 101 mmol/L (ref 98–111)
Creatinine, Ser: 0.9 mg/dL (ref 0.61–1.24)
Glucose, Bld: 278 mg/dL — ABNORMAL HIGH (ref 70–99)
HCT: 36 % — ABNORMAL LOW (ref 39.0–52.0)
Hemoglobin: 12.2 g/dL — ABNORMAL LOW (ref 13.0–17.0)
POTASSIUM: 3.7 mmol/L (ref 3.5–5.1)
SODIUM: 139 mmol/L (ref 135–145)
TCO2: 29 mmol/L (ref 22–32)

## 2018-10-18 LAB — CBG MONITORING, ED: Glucose-Capillary: 267 mg/dL — ABNORMAL HIGH (ref 70–99)

## 2018-10-18 LAB — I-STAT TROPONIN, ED: Troponin i, poc: 0.02 ng/mL (ref 0.00–0.08)

## 2018-10-18 NOTE — Discharge Instructions (Addendum)
Sugar today was mildly elevated today at 278.  Take your normal evening dose of insulin today.  Return if your condition worsens for any reason or see Dr. Sharen HonesGutierrez

## 2018-10-18 NOTE — ED Triage Notes (Signed)
Pt arrives via EMS from home with reports of dizziness while walking and nauseous. Hx of CVA with left sided weakness where he drags his leg. States the dizziness is gone but comes back when he stands up.

## 2018-10-18 NOTE — ED Provider Notes (Signed)
[facility] Provider Note   CSN: [id number] Arrival date & time: 10/18/18  1315     History   Chief Complaint Chief Complaint  Patient presents with  . Dizziness    HPI Denton LankSamuel J Jenifer Jr. is a 77 y.o. male.  HPI Planes of generalized weakness upon awakening this morning with nausea.  Generalized weakness resolved spontaneously without treatment.  Nausea lasted several minutes resolved spontaneously he is presently asymptomatic without treatment.  He denies pain anywhere denies headache.  No no chest pain.  No vomiting.  No abdominal pain.  No other associated symptoms.  Nothing made symptoms better or worse.  Presently asymptomatic Past Medical History:  Diagnosis Date  . CRI (chronic renal insufficiency)   . 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 Diabetes mellitus type II 1992  . HLD (hyperlipidemia) 10/1998  . HTN (hypertension) 10/1998  . Primary open angle glaucoma    [name]  . RBBB   . Stroke [facility](HCC)    Left sided weakness    Patient Active Problem List   Diagnosis Date Noted  . Iron deficiency anemia 04/29/2018  . Pedal edema 07/11/2017  . Primary open angle glaucoma   . Advanced care planning/counseling discussion 02/20/2015  . Benign prostatic hyperplasia 02/20/2015  . Falls 11/08/2013  . Medicare annual wellness visit, subsequent 05/25/2012  . Skin rash 01/24/2012  . 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 Onychomycosis of toenail 09/10/2010  . Renal insufficiency 04/13/2009  . GERD 03/30/2009  . Diabetes mellitus type 2, uncontrolled, with complications (HCC) 05/04/2007  . HLD (hyperlipidemia) 05/04/2007  . Essential hypertension 05/04/2007  . SYMPTOM, INCONTINENCE, MIXED, URGE/STRESS 05/04/2007  . Ex-smoker 05/04/2007    Past Surgical History:  Procedure Laterality Date  . CATARACT EXTRACTION W/PHACO  09/09/2012   Procedure: CATARACT EXTRACTION  PHACO AND INTRAOCULAR LENS PLACEMENT (IOC);  Surgeon: Chalmers Guestoy [name], MD;  [facility];  Service: Ophthalmology;  Laterality: Right;  . CATARACT EXTRACTION W/PHACO Left 03/31/2013   Procedure: CATARACT EXTRACTION PHACO AND INTRAOCULAR LENS PLACEMENT (IOC);  Surgeon: Chalmers Guestoy [name], MD;  [facility];  Service: Ophthalmology;  Laterality: Left;  . EYE SURGERY    . lipoma removal  1970's   Right shoulder  . US ECHOCARDIOGRAPHY  03/2013   mod LVH, EF 60%, normal wall motion        Home Medications    Prior to Admission medications   Medication Sig Start Date End Date Taking? Authorizing Provider  amLODipine-atorvastatin (CADUET) 10-40 MG tablet TAKE 1 TABLET BY MOUTH ONCE DAILY 06/19/18   Eustaquio BoydenGutierrez, Javier, MD  aspirin 325 MG EC tablet Take 325 mg by mouth daily. For stroke prevention      [provider]  diclofenac sodium (VOLTAREN) 1 % GEL Apply 1 application topically 3 (three) times daily as needed. 04/29/18   Eustaquio BoydenGutierrez, Javier, MD  diltiazem [facility](TIAZAC) 360 MG 24 hr capsule TAKE 1 CAPSULE BY MOUTH ONCE DAILY 02/16/18   Eustaquio BoydenGutierrez, Javier, MD  ferrous sulfate 325 (65 FE) MG tablet Take 1 tablet (325 mg total) by mouth every other day. 08/31/18   Eustaquio BoydenGutierrez, Javier, MD  glucose blood [facility](EMBRACE BLOOD GLUCOSE TEST) test strip Check three times daily and when feeling ill E11.8 insulin dependent 12/12/17   Eustaquio BoydenGutierrez, Javier, MD  hydrochlorothiazide (HYDRODIURIL) 25 MG tablet TAKE 1 TABLET BY MOUTH ONCE DAILY 06/19/18   Eustaquio BoydenGutierrez, Javier, MD  insulin aspart protamine- aspart (NOVOLOG MIX 70/30) (70-30)  100 UNIT/ML injection Take 25 units with breakfast and 22 units with supper. QS 1 month 04/29/18   [name], MD  latanoprost (XALATAN) 0.005 % ophthalmic solution Place 1 drop into both eyes at bedtime.    [provider]  Loperamide HCl (IMODIUM PO) Take 1 tablet by mouth as needed.     [provider]  losartan (COZAAR) 100 MG tablet TAKE 1 TABLET BY MOUTH ONCE DAILY  06/19/18   [name], MD  metFORMIN (GLUCOPHAGE XR) 750 MG 24 hr tablet Take 1 tablet (750 mg total) by mouth daily with breakfast. 04/29/18   [name], MD  potassium chloride (K-DUR) 10 MEQ tablet TAKE 3 TABLETS BY MOUTH ONCE DAILY 06/05/18   [name], MD  diltiazem (CARDIZEM CD) 360 MG 24 hr capsule Take 1 capsule (360 mg total) by mouth daily. 12/02/12 12/15/13  [name], MD    Family History Family History  Problem Relation Age of Onset  . Heart attack Father 47  . Hypertension Sister        Multiple medical problems  . Diabetes Sister   . Stroke Neg Hx   . Cancer Neg Hx     Social History Social History   Tobacco Use  . Smoking status: Former Smoker    Packs/day: 1.00    Years: 30.00    Pack years: 30.00    Types: Cigarettes    Last attempt to quit: 09/07/1996    Years since quitting: 22.1  . Smokeless tobacco: Never Used  . Tobacco comment: quit after CVA  Substance Use Topics  . Alcohol use: No  . Drug use: No     Allergies   Tape   Review of Systems Review of Systems  Constitutional: Negative.   HENT: Negative.   Respiratory: Negative.   Cardiovascular: Positive for chest pain.       Syncope  Gastrointestinal: Positive for nausea.  Musculoskeletal: Positive for gait problem.       Walks with walker  Skin: Negative.   Allergic/Immunologic: Positive for immunocompromised state.       Diabetic  Neurological: Positive for weakness.       Chronic left-sided weakness.  Left arm is completely flaccid.  Left leg weakness wears brace on left leg  Psychiatric/Behavioral: Negative.   All other systems reviewed and are negative.    Physical Exam Updated Vital Signs BP (!) 150/69   Pulse 79   Temp 98.2 F (36.8 C) (Oral)   Resp 17   Ht 5\' 11"  (1.803 m)   Wt 93 kg   SpO2 100%   BMI 28.59 kg/m   Physical Exam  Constitutional: He is oriented to person, place, and time. He appears well-developed and well-nourished.  HENT:    Head: Normocephalic and atraumatic.  Eyes: Pupils are equal, round, and reactive to light. Conjunctivae are normal.  Neck: Neck supple. No tracheal deviation present. No thyromegaly present.  Cardiovascular: Normal rate and regular rhythm.  No murmur heard. Pulmonary/Chest: Effort normal and breath sounds normal.  Abdominal: Soft. Bowel sounds are normal. He exhibits no distension. There is no tenderness.  Musculoskeletal: Normal range of motion. He exhibits no edema or tenderness.  Neurological: He is alert and oriented to person, place, and time. No cranial nerve deficit. Coordination normal.  Motor strength of left upper extremity 0/5.  Motor strength of left lower extremity 4/5.  Right upper extremity 5/5 right lower extremity 5/5.  Skin: Skin is warm and dry. No rash noted.  Psychiatric: He has a normal mood and affect.  Nursing note and vitals reviewed.    ED Treatments / Results  Labs (all labs ordered are listed, but only abnormal results are displayed) Labs Reviewed  CBG MONITORING, ED - Abnormal; Notable for the following components:      Result Value   Glucose-Capillary 267 (*)    All other components within normal limits  I-STAT CHEM 8, ED - Abnormal; Notable for the following components:   Glucose, Bld 278 (*)    Calcium, Ion 1.13 (*)    Hemoglobin 12.2 (*)    HCT 36.0 (*)    All other components within normal limits  I-STAT TROPONIN, ED    EKG EKG Interpretation  Date/Time:  Sunday October 18 2018 13:22:43 EST Ventricular Rate:  80 PR Interval:    QRS Duration: 148 QT Interval:  431 QTC Calculation: 498 R Axis:   -45 Text Interpretation:  Sinus rhythm Right bundle branch block Inferior infarct, old Lateral leads are also involved No significant change since last tracing Confirmed by Doug Sou [phone]) on 10/18/2018 3:38:57 PM Lab work consistent with hyperglycemia otherwise normal Results for orders placed or performed during the hospital encounter of  10/18/18  CBG monitoring, ED  Result Value Ref Range   Glucose-Capillary 267 (H) 70 - 99 mg/dL  I-stat troponin, ED  Result Value Ref Range   Troponin i, poc 0.02 0.00 - 0.08 ng/mL   Comment 3          I-stat chem 8, ed  Result Value Ref Range   Sodium 139 135 - 145 mmol/L   Potassium 3.7 3.5 - 5.1 mmol/L   Chloride 101 98 - 111 mmol/L   BUN 15 8 - 23 mg/dL   Creatinine, Ser 6.04 0.61 - 1.24 mg/dL   Glucose, Bld 540 (H) 70 - 99 mg/dL   Calcium, Ion 9.81 (L) 1.15 - 1.40 mmol/L   TCO2 29 22 - 32 mmol/L   Hemoglobin 12.2 (L) 13.0 - 17.0 g/dL   HCT 19.1 (L) 47.8 - 29.5 %   No results found. Radiology No results found.  Procedures Procedures (including critical care time)  Medications Ordered in ED Medications - No data to display 4:20 PM patient continues to be asymptomatic.  He is able to walk with his walker without difficulty.  He is not lightheaded on standing.  Initial Impression / Assessment and Plan / ED Course  I have reviewed the triage vital signs and the nursing notes.  Pertinent labs & imaging results that were available during my care of the patient were reviewed by me and considered in my medical decision making (see chart for details).     Weakness is transient.  Patient did not take morning dose of insulin.  I have advised him to take his normal evening dose of insulin.  Follow-up with PMD as needed or return here.   Final Clinical Impressions(s) / ED Diagnoses  Diagnoses #1 transient weakness #2 hyperglycemia Final diagnoses:  None    ED Discharge Orders    None       Doug Sou, MD 10/18/18 [phone]

## 2018-11-19 ENCOUNTER — Other Ambulatory Visit: Payer: Self-pay | Admitting: Family Medicine

## 2018-12-24 ENCOUNTER — Other Ambulatory Visit: Payer: Self-pay | Admitting: Family Medicine

## 2019-01-06 ENCOUNTER — Ambulatory Visit: Payer: Medicare Other | Admitting: Family Medicine

## 2019-01-06 LAB — HM DIABETES EYE EXAM

## 2019-01-13 ENCOUNTER — Ambulatory Visit (INDEPENDENT_AMBULATORY_CARE_PROVIDER_SITE_OTHER): Payer: Medicare Other | Admitting: Family Medicine

## 2019-01-13 ENCOUNTER — Encounter: Payer: Self-pay | Admitting: Family Medicine

## 2019-01-13 VITALS — BP 140/56 | HR 100 | Temp 98.2°F | Ht 71.0 in | Wt 190.6 lb

## 2019-01-13 DIAGNOSIS — I10 Essential (primary) hypertension: Secondary | ICD-10-CM | POA: Diagnosis not present

## 2019-01-13 DIAGNOSIS — B351 Tinea unguium: Secondary | ICD-10-CM | POA: Diagnosis not present

## 2019-01-13 DIAGNOSIS — D509 Iron deficiency anemia, unspecified: Secondary | ICD-10-CM | POA: Diagnosis not present

## 2019-01-13 DIAGNOSIS — E118 Type 2 diabetes mellitus with unspecified complications: Secondary | ICD-10-CM | POA: Diagnosis not present

## 2019-01-13 DIAGNOSIS — I69954 Hemiplegia and hemiparesis following unspecified cerebrovascular disease affecting left non-dominant side: Secondary | ICD-10-CM

## 2019-01-13 DIAGNOSIS — E1165 Type 2 diabetes mellitus with hyperglycemia: Secondary | ICD-10-CM

## 2019-01-13 DIAGNOSIS — IMO0002 Reserved for concepts with insufficient information to code with codable children: Secondary | ICD-10-CM

## 2019-01-13 LAB — POCT GLYCOSYLATED HEMOGLOBIN (HGB A1C): Hemoglobin A1C: 8.2 % — AB (ref 4.0–5.6)

## 2019-01-13 MED ORDER — METFORMIN HCL ER 500 MG PO TB24: 500.0000 mg | ORAL_TABLET | Freq: Every day | ORAL | 6 refills | Status: DC

## 2019-01-13 MED ORDER — EMPAGLIFLOZIN 10 MG PO TABS: 10.0000 mg | ORAL_TABLET | Freq: Every day | ORAL | 6 refills | Status: DC

## 2019-01-13 NOTE — Assessment & Plan Note (Addendum)
Pt concerned metformin causing diarrhea/loose stools. Will decrease to 500mg  XR daily. Will price out jardiance hoping to transition off metformin. Continue current insulin dose. Will refer to podiatry for foot care in diabetic.

## 2019-01-13 NOTE — Assessment & Plan Note (Addendum)
Again reviewed concerns with IDA and possible colon cancer ddx, he continues to decline GI referral or iFOB. Continue iron QOD. Update labs at CPE.

## 2019-01-13 NOTE — Patient Instructions (Addendum)
Check sugars 3 times a week, write down results to bring in to next visit.  Blood pressure is good. Sugars too high. Continue current insulin. Decrease metformin XR to 500mg  once daily to see effect on stools. Price out [employer] 10mg  daily (see which is cheaper - jardiance, farxiga, or invokana).  We will refer you to foot doctor

## 2019-01-13 NOTE — Assessment & Plan Note (Addendum)
Chronic, stable. Continue current regimen. 

## 2019-01-13 NOTE — Progress Notes (Signed)
BP (!) 140/56 (BP Location: Right Arm, Cuff Size: Large)   Pulse 100   Temp 98.2 F (36.8 C) (Oral)   Ht 5\' 11"  (1.803 m)   Wt 190 lb 9 oz (86.4 kg)   SpO2 96%   BMI 26.58 kg/m    CC: 4 mo DM f/u visit Subjective:    Patient ID: Marcus Lank., male    DOB: 01/10/1941, 78 y.o.   MRN: [id number]  HPI: Marcus Laudon. is a 78 y.o. male presenting on 01/13/2019 for Follow-up (Here for 4 mo f/u.)   Seen at ER 10/2018 with transient weakness without cause found.   IDA - pt declined stool test or GI referral. Has been taking iron OTC QOD. Weight loss of 15 lbs. Denies blood in stool. Discussed concern with undiagnosed coon cancer. Again declines GI referral today.   DM - does check sugars at home - aide Elease Hashimoto) helps him with this due to hemiparesis - aide with him 10am-1pm MWF. Checks 2 hours after breakfast postprandial - 309 on Monday. Compliant with antihyperglycemic regimen which includes: novolog 70/30 25/22u, metformin XR 750mg  daily. Having diarrhea/loose stools even with metformin XR. Denies low sugars or hypoglycemic symptoms. Denies paresthesias. Last diabetic eye exam 03/2018. Pneumovax: 2011. Prevnar: 2015. Glucometer brand: unsure. DSME: not done. Last visit referred to podiatry - never received call to schedule podiatry.  Lab Results  Component Value Date   HGBA1C 8.2 (A) 01/13/2019   Diabetic Foot Exam - Simple   No data filed     Lab Results  Component Value Date   MICROALBUR 8.2 (H) 09/01/2012        Relevant past medical, surgical, family and social history reviewed and updated as indicated. Interim medical history since our last visit reviewed. Allergies and medications reviewed and updated. Outpatient Medications Prior to Visit  Medication Sig Dispense Refill  . amLODipine-atorvastatin (CADUET) 10-40 MG tablet TAKE 1 TABLET BY MOUTH ONCE DAILY 90 tablet 1  . aspirin 325 MG EC tablet Take 325 mg by mouth daily. For stroke prevention      . diclofenac  sodium (VOLTAREN) 1 % GEL Apply 1 application topically 3 (three) times daily as needed. 1 Tube 3  . diltiazem (TIAZAC) 360 MG 24 hr capsule TAKE 1 CAPSULE BY MOUTH ONCE DAILY 90 capsule 0  . ferrous sulfate 325 (65 FE) MG tablet Take 1 tablet (325 mg total) by mouth every other day.  3  . glucose blood (EMBRACE BLOOD GLUCOSE TEST) test strip Check three times daily and when feeling ill E11.8 insulin dependent 100 each 12  . hydrochlorothiazide (HYDRODIURIL) 25 MG tablet TAKE 1 TABLET BY MOUTH ONCE DAILY 90 tablet 0  . insulin aspart protamine- aspart (NOVOLOG MIX 70/30) (70-30) 100 UNIT/ML injection Take 25 units with breakfast and 22 units with supper. QS 1 month 20 mL 11  . latanoprost (XALATAN) 0.005 % ophthalmic solution Place 1 drop into both eyes at bedtime.    . Loperamide HCl (IMODIUM PO) Take 1 tablet by mouth as needed.     Marland Kitchen losartan (COZAAR) 100 MG tablet TAKE 1 TABLET BY MOUTH ONCE DAILY 90 tablet 1  . potassium chloride (K-DUR) 10 MEQ tablet TAKE 3 TABLETS BY MOUTH ONCE DAILY 270 tablet 3  . metFORMIN (GLUCOPHAGE XR) 750 MG 24 hr tablet Take 1 tablet (750 mg total) by mouth daily with breakfast. 60 tablet 11   No facility-administered medications prior to visit.  Per HPI unless specifically indicated in ROS section below Review of Systems Objective:    BP (!) 140/56 (BP Location: Right Arm, Cuff Size: Large)   Pulse 100   Temp 98.2 F (36.8 C) (Oral)   Ht 5\' 11"  (1.803 m)   Wt 190 lb 9 oz (86.4 kg)   SpO2 96%   BMI 26.58 kg/m   Wt Readings from Last 3 Encounters:  01/13/19 190 lb 9 oz (86.4 kg)  10/18/18 205 lb (93 kg)  08/31/18 201 lb 8 oz (91.4 kg)    Physical Exam Vitals signs and nursing note reviewed.  Constitutional:      General: He is not in acute distress.    Appearance: He is well-developed.  HENT:     Head: Normocephalic and atraumatic.     Mouth/Throat:     Mouth: Mucous membranes are moist.     Pharynx: No oropharyngeal exudate.  Eyes:      General: No scleral icterus.    Conjunctiva/sclera: Conjunctivae normal.     Pupils: Pupils are equal, round, and reactive to light.  Neck:     Musculoskeletal: Normal range of motion and neck supple.  Cardiovascular:     Rate and Rhythm: Normal rate and regular rhythm.     Pulses: Normal pulses.     Heart sounds: Normal heart sounds. No murmur.  Pulmonary:     Effort: Pulmonary effort is normal. No respiratory distress.     Breath sounds: Normal breath sounds. No wheezing, rhonchi or rales.  Musculoskeletal:     Comments: See HPI for foot exam if done  Lymphadenopathy:     Cervical: No cervical adenopathy.  Skin:    General: Skin is warm and dry.     Findings: No rash.  Neurological:     Motor: Weakness present.     Comments: Chronic L hemiparesis       Results for orders placed or performed in visit on 01/13/19  POCT glycosylated hemoglobin (Hb A1C)  Result Value Ref Range   Hemoglobin A1C 8.2 (A) 4.0 - 5.6 %   HbA1c POC (<> result, manual entry)     HbA1c, POC (prediabetic range)     HbA1c, POC (controlled diabetic range)     Lab Results  Component Value Date   CREATININE 0.90 10/18/2018   BUN 15 10/18/2018   NA 139 10/18/2018   K 3.7 10/18/2018   CL 101 10/18/2018   CO2 30 08/31/2018    Assessment & Plan:   Problem List Items Addressed This Visit    Onychomycosis of toenail   Relevant Orders   Ambulatory referral to Podiatry   Iron deficiency anemia    Again reviewed concerns with IDA and possible colon cancer ddx, he continues to decline GI referral or iFOB. Continue iron QOD. Update labs at CPE.       Hemiplegia, late effect of cerebrovascular disease (HCC)   Essential hypertension    Chronic, stable. Continue current regimen.       Diabetes mellitus type 2, uncontrolled, with complications (HCC) - Primary    Pt concerned metformin causing diarrhea/loose stools. Will decrease to 500mg  XR daily. Will price out jardiance hoping to transition off  metformin. Continue current insulin dose. Will refer to podiatry for foot care in diabetic.       Relevant Medications   metFORMIN (GLUCOPHAGE-XR) 500 MG 24 hr tablet   empagliflozin (JARDIANCE) 10 MG TABS tablet   Other Relevant Orders   POCT glycosylated hemoglobin (Hb  A1C) (Completed)   Ambulatory referral to Podiatry       Meds ordered this encounter  Medications  . metFORMIN (GLUCOPHAGE-XR) 500 MG 24 hr tablet    Sig: Take 1 tablet (500 mg total) by mouth daily with breakfast.    Dispense:  30 tablet    Refill:  6  . empagliflozin (JARDIANCE) 10 MG TABS tablet    Sig: Take 10 mg by mouth daily.    Dispense:  30 tablet    Refill:  6   Orders Placed This Encounter  Procedures  . Ambulatory referral to Podiatry    Referral Priority:   Routine    Referral Type:   Consultation    Referral Reason:   Specialty Services Required    Requested Specialty:   Podiatry    Number of Visits Requested:   1  . POCT glycosylated hemoglobin (Hb A1C)    Follow up plan: Return in about 4 months (around 05/14/2019) for annual exam, prior fasting for blood work, medicare wellness visit.  [name], MD

## 2019-01-14 ENCOUNTER — Telehealth: Payer: Self-pay

## 2019-01-14 NOTE — Telephone Encounter (Signed)
Noted. Thanks.

## 2019-01-14 NOTE — Telephone Encounter (Signed)
[name], who is caregiver for patient and manages his medications, calls to seek clarification on his medication changes from yesterday on the jardiance and metformin.   Patient does not have a DPR and I spoke directly with patient (after he confirmed his DOB) for me and received verbal okay to disclose details to [employer].  Reviewed office visit note from yesterday and clarified that Dr. Sharen Hones has changed the metformin from twice daily to the once daily XR form of metformin.  Explained that this should help patient with his loose stools.  Also, confirmed dosing on the jardiance of 10mg  once daily.  FYI only to Dr. Sharen Hones.   Thanks.

## 2019-02-15 ENCOUNTER — Ambulatory Visit: Payer: Medicare Other | Admitting: Podiatry

## 2019-02-18 ENCOUNTER — Other Ambulatory Visit: Payer: Self-pay | Admitting: Family Medicine

## 2019-03-15 ENCOUNTER — Ambulatory Visit: Payer: Medicare Other | Admitting: Podiatry

## 2019-03-20 ENCOUNTER — Telehealth: Payer: Self-pay

## 2019-03-20 MED ORDER — HYDROCHLOROTHIAZIDE 25 MG PO TABS: 25.0000 mg | ORAL_TABLET | Freq: Every day | ORAL | 0 refills | Status: DC

## 2019-03-20 NOTE — Telephone Encounter (Signed)
Rx sent electronically.  

## 2019-04-23 ENCOUNTER — Ambulatory Visit (INDEPENDENT_AMBULATORY_CARE_PROVIDER_SITE_OTHER): Payer: Medicare Other

## 2019-04-23 DIAGNOSIS — Z0000 Encounter for general adult medical examination without abnormal findings: Secondary | ICD-10-CM

## 2019-04-23 NOTE — Progress Notes (Signed)
Subjective:   Marcus Downs. is a 78 y.o. male who presents for Medicare Annual/Subsequent preventive examination.  Review of Systems:  N/A Cardiac Risk Factors include: advanced age (>74men, >84 women);dyslipidemia;hypertension;male gender;diabetes mellitus     Objective:    Vitals: There were no vitals taken for this visit.  There is no height or weight on file to calculate BMI.  Advanced Directives 10/18/2018 04/20/2018 04/17/2016 04/17/2016 09/07/2012  Does Patient Have a Medical Advance Directive? No No No No Patient does not have advance directive  Would patient like information on creating a medical advance directive? No - Patient declined Yes (MAU/Ambulatory/Procedural Areas - Information given) Yes - Educational materials given Yes - Transport planner given -    Tobacco Social History   Tobacco Use  Smoking Status Former Smoker  . Packs/day: 1.00  . Years: 30.00  . Pack years: 30.00  . Types: Cigarettes  . Last attempt to quit: 09/07/1996  . Years since quitting: 22.6  Smokeless Tobacco Never Used  Tobacco Comment   quit after CVA     Counseling given: No Comment: quit after CVA   Clinical Intake:  Pre-visit preparation completed: Yes  Pain : No/denies pain Pain Score: 0-No pain     Nutritional Status: BMI 25 -29 Overweight Nutritional Risks: None Diabetes: Yes CBG done?: No Did pt. bring in CBG monitor from home?: No  How often do you need to have someone help you when you read instructions, pamphlets, or other written materials from your doctor or pharmacy?: 2 - Rarely What is the last grade level you completed in school?: 12th grade + 1 yr college  Interpreter Needed?: No  Comments: pt lives with alone with support of home health aides Information entered by :: LPinson, LPN  Past Medical History:  Diagnosis Date  . CRI (chronic renal insufficiency)   . 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 Diabetes  mellitus type II 1992  . HLD (hyperlipidemia) 10/1998  . HTN (hypertension) 10/1998  . Primary open angle glaucoma    [name]  . RBBB   . Stroke [facility])    Left sided weakness   Past Surgical History:  Procedure Laterality Date  . CATARACT EXTRACTION W/PHACO  09/09/2012   Procedure: CATARACT EXTRACTION PHACO AND INTRAOCULAR LENS PLACEMENT (IOC);  Surgeon: [name], MD;  Location: [facility] OR;  Service: Ophthalmology;  Laterality: Right;  . CATARACT EXTRACTION W/PHACO Left 03/31/2013   Procedure: CATARACT EXTRACTION PHACO AND INTRAOCULAR LENS PLACEMENT (IOC);  Surgeon: [name], MD;  Location: [facility] OR;  Service: Ophthalmology;  Laterality: Left;  . EYE SURGERY    . lipoma removal  1970's   Right shoulder  . US ECHOCARDIOGRAPHY  03/2013   mod LVH, EF 60%, normal wall motion   Family History  Problem Relation Age of Onset  . Heart attack Father 38  . Hypertension Sister        Multiple medical problems  . Diabetes Sister   . Stroke Neg Hx   . Cancer Neg Hx    Social History   Socioeconomic History  . Marital status: Divorced    Spouse name: Not on file  . Number of children: 2  . Years of education: Not on file  . Highest education level: Not on file  Occupational History  . Occupation: Retired medically from [employer]  Social Needs  . Financial resource strain: Not on file  . Food insecurity:    Worry: Not  on file    Inability: Not on file  . Transportation needs:    Medical: Not on file    Non-medical: Not on file  Tobacco Use  . Smoking status: Former Smoker    Packs/day: 1.00    Years: 30.00    Pack years: 30.00    Types: Cigarettes    Last attempt to quit: 09/07/1996    Years since quitting: 22.6  . Smokeless tobacco: Never Used  . Tobacco comment: quit after CVA  Substance and Sexual Activity  . Alcohol use: No  . Drug use: No  . Sexual activity: Not Currently  Lifestyle  . Physical activity:    Days per week: Not on file    Minutes per session:  Not on file  . Stress: Not on file  Relationships  . Social connections:    Talks on phone: Not on file    Gets together: Not on file    Attends religious service: Not on file    Active member of club or organization: Not on file    Attends meetings of clubs or organizations: Not on file    Relationship status: Not on file  Other Topics Concern  . Not on file  Social History Narrative   Caffeine: neg   Lives alone. Friend comes and helps sometimes. Son's brother in law helps with medicines. Family nearby - 2 sisters and nieces/nephews in area.      [facility]    Outpatient Encounter Medications as of 04/23/2019  Medication Sig  . amLODipine-atorvastatin (CADUET) 10-40 MG tablet TAKE 1 TABLET BY MOUTH ONCE DAILY  . aspirin 325 MG EC tablet Take 325 mg by mouth daily. For stroke prevention    . diclofenac sodium (VOLTAREN) 1 % GEL Apply 1 application topically 3 (three) times daily as needed.  . diltiazem (TIAZAC) 360 MG 24 hr capsule Take 1 capsule by mouth once daily  . empagliflozin (JARDIANCE) 10 MG TABS tablet Take 10 mg by mouth daily.  . ferrous sulfate 325 (65 FE) MG tablet Take 1 tablet (325 mg total) by mouth every other day.  Marland Kitchen. glucose blood (EMBRACE BLOOD GLUCOSE TEST) test strip Check three times daily and when feeling ill E11.8 insulin dependent  . hydrochlorothiazide (HYDRODIURIL) 25 MG tablet Take 1 tablet (25 mg total) by mouth daily.  . insulin aspart protamine- aspart (NOVOLOG MIX 70/30) (70-30) 100 UNIT/ML injection Take 25 units with breakfast and 22 units with supper. QS 1 month  . latanoprost (XALATAN) 0.005 % ophthalmic solution Place 1 drop into both eyes at bedtime.  . Loperamide HCl (IMODIUM PO) Take 1 tablet by mouth as needed.   Marland Kitchen. losartan (COZAAR) 100 MG tablet TAKE 1 TABLET BY MOUTH ONCE DAILY  . metFORMIN (GLUCOPHAGE-XR) 500 MG 24 hr tablet Take 1 tablet (500 mg total) by mouth daily with breakfast.  .  potassium chloride (K-DUR) 10 MEQ tablet TAKE 3 TABLETS BY MOUTH ONCE DAILY  . [DISCONTINUED] diltiazem (CARDIZEM CD) 360 MG 24 hr capsule Take 1 capsule (360 mg total) by mouth daily.   No facility-administered encounter medications on file as of 04/23/2019.     Activities of Daily Living In your present state of health, do you have any difficulty performing the following activities: 04/23/2019  Hearing? N  Vision? N  Difficulty concentrating or making decisions? N  Walking or climbing stairs? Y  Dressing or bathing? N  Doing errands, shopping? Y  Quarry managerreparing Food  and eating ? Y  Using the Toilet? N  In the past six months, have you accidently leaked urine? Y  Do you have problems with loss of bowel control? Y  Managing your Medications? Y  Managing your Finances? Y  Housekeeping or managing your Housekeeping? Y  Some recent data might be hidden    Patient Care Team: [name], MD as PCP - Levada Dy, MD as Consulting Physician (Ophthalmology)   Assessment:   This is a routine wellness examination for [place].  Vision Screening Comments: Last vision exam in 2019 with Dr. Harlon Flor  Exercise Activities and Dietary recommendations Current Exercise Habits: The patient does not participate in regular exercise at present, Exercise limited by: orthopedic condition(s)  Goals    . Patient Stated     Starting 04/23/2019, I will keep taking medication as prescribed.        Fall Risk Fall Risk  04/23/2019 04/20/2018 04/25/2017 04/17/2016 04/17/2016  Falls in the past year? 0 Yes Yes No No  Number falls in past yr: - 2 or more 2 or more - -  Injury with Fall? - Yes No - -  Risk Factor Category  - High Fall Risk High Fall Risk - -  Risk for fall due to : - - Impaired balance/gait;Impaired mobility - -  Follow up - - Falls prevention discussed - -   Depression Screen PHQ 2/9 Scores 04/23/2019 04/20/2018 04/25/2017 04/17/2016  PHQ - 2 Score 0 0 0 0  PHQ- 9 Score 0 0 - -     Cognitive Function MMSE - Mini Mental State Exam 04/20/2018 04/17/2016  Orientation to time 5 5  Orientation to Place 5 5  Registration 3 3  Attention/ Calculation 0 0  Recall 1 3  Recall-comments unable to recall 2 of 3 words -  Language- name 2 objects 0 0  Language- repeat 1 1  Language- follow 3 step command 3 3  Language- read & follow direction 0 0  Write a sentence 0 0  Copy design 0 0  Total score 18 20     PLEASE NOTE: A Mini-Cog screen was completed. Maximum score is 17. A value of 0 denotes this part of Folstein MMSE was not completed or the patient failed this part of the Mini-Cog screening.   Mini-Cog Screening Orientation to Time - Max 5 pts Orientation to Place - Max 5 pts Registration - Max 3 pts Recall - Max 3 pts Language Repeat - Max 1 pts      Immunization History  Administered Date(s) Administered  . Influenza Split 09/01/2012  . Influenza Whole 09/18/2002, 08/27/2007, 08/29/2008, 09/10/2010  . Influenza,inj,Quad PF,6+ Mos 07/28/2013, 10/17/2014, 08/21/2015, 10/18/2016, 08/15/2017  . Pneumococcal Conjugate-13 06/10/2014  . Pneumococcal Polysaccharide-23 08/18/1996, 06/19/2010  . Td 10/31/1998, 01/26/2009  . Zoster 09/01/2012    Screening Tests Health Maintenance  Topic Date Due  . DTaP/Tdap/Td (1 - Tdap) 01[birth year]20/1960  . TETANUS/TDAP  01/27/2019  . OPHTHALMOLOGY EXAM  03/19/2019  . INFLUENZA VACCINE  06/19/2019  . HEMOGLOBIN A1C  07/14/2019  . FOOT EXAM  09/01/2019  . PNA vac Low Risk Adult  Completed     Plan:     I have personally reviewed, addressed, and noted the following in the patient's chart:  A. Medical and social history B. Use of alcohol, tobacco or illicit drugs  C. Current medications and supplements D. Functional ability and status E.  Nutritional status F.  Physical activity G. Advance directives H. List of  other physicians I.  Hospitalizations, surgeries, and ER visits in previous 12 months J.  Vitals (unless it is a  telemedicine encounter) K. Screenings to include cognitive, depression, hearing, vision (NOTE: hearing and vision screenings not completed in telemedicine encounter) L. Referrals and appointments   In addition, I have reviewed and discussed with patient certain preventive protocols, quality metrics, and best practice recommendations. A written personalized care plan for preventive services and recommendations were provided to patient.  With patient's permission, we connected on 04/23/19 at 12:30 PM EDT . Interactive audio and video telecommunications were attempted with patient. This attempt was unsuccessful due to patient having technical difficulties OR patient did not have access to video capability.  Encounter was completed with audio only.  Two patient identifiers were used to ensure the encounter occurred with the correct person. Patient was in home and writer was in office.   Signed,   Randa Evens, MHA, BS, LPN Health Coach

## 2019-04-23 NOTE — Patient Instructions (Signed)
Marcus Downs , Thank you for taking time to come for your Medicare Wellness Visit. I appreciate your ongoing commitment to your health goals. Please review the following plan we discussed and let me know if I can assist you in the future.   These are the goals we discussed: Goals    . Patient Stated     Starting 04/23/2019, I will keep taking medication as prescribed.        This is a list of the screening recommended for you and due dates:  Health Maintenance  Topic Date Due  . DTaP/Tdap/Td vaccine (1 - Tdap) [birth year]20/1960  . Tetanus Vaccine  01/27/2019  . Eye exam for diabetics  03/19/2019  . Flu Shot  06/19/2019  . Hemoglobin A1C  07/14/2019  . Complete foot exam   09/01/2019  . Pneumonia vaccines  Completed   Preventive Care for Adults  A healthy lifestyle and preventive care can promote health and wellness. Preventive health guidelines for adults include the following key practices.  . A routine yearly physical is a good way to check with your health care provider about your health and preventive screening. It is a chance to share any concerns and updates on your health and to receive a thorough exam.  . Visit your dentist for a routine exam and preventive care every 6 months. Brush your teeth twice a day and floss once a day. Good oral hygiene prevents tooth decay and gum disease.  . The frequency of eye exams is based on your age, health, family medical history, use  of contact lenses, and other factors. Follow your health care provider's recommendations for frequency of eye exams.  . Eat a healthy diet. Foods like vegetables, fruits, whole grains, low-fat dairy products, and lean protein foods contain the nutrients you need without too many calories. Decrease your intake of foods high in solid fats, added sugars, and salt. Eat the right amount of calories for you. Get information about a proper diet from your health care provider, if necessary.  . Regular physical exercise is one  of the most important things you can do for your health. Most adults should get at least 150 minutes of moderate-intensity exercise (any activity that increases your heart rate and causes you to sweat) each week. In addition, most adults need muscle-strengthening exercises on 2 or more days a week.  Silver Sneakers may be a benefit available to you. To determine eligibility, you may visit the website: www.silversneakers.com or contact program at 980-302-9566 Mon-Fri between 8AM-8PM.   . Maintain a healthy weight. The body mass index (BMI) is a screening tool to identify possible weight problems. It provides an estimate of body fat based on height and weight. Your health care provider can find your BMI and can help you achieve or maintain a healthy weight.   For adults 20 years and older: ? A BMI below 18.5 is considered underweight. ? A BMI of 18.5 to 24.9 is normal. ? A BMI of 25 to 29.9 is considered overweight. ? A BMI of 30 and above is considered obese.   . Maintain normal blood lipids and cholesterol levels by exercising and minimizing your intake of saturated fat. Eat a balanced diet with plenty of fruit and vegetables. Blood tests for lipids and cholesterol should begin at age 33 and be repeated every 5 years. If your lipid or cholesterol levels are high, you are over 50, or you are at high risk for heart disease, you may need your  cholesterol levels checked more frequently. Ongoing high lipid and cholesterol levels should be treated with medicines if diet and exercise are not working.  . If you smoke, find out from your health care provider how to quit. If you do not use tobacco, please do not start.  . If you choose to drink alcohol, please do not consume more than 2 drinks per day. One drink is considered to be 12 ounces (355 mL) of beer, 5 ounces (148 mL) of wine, or 1.5 ounces (44 mL) of liquor.  . If you are 34-16 years old, ask your health care provider if you should take aspirin  to prevent strokes.  . Use sunscreen. Apply sunscreen liberally and repeatedly throughout the day. You should seek shade when your shadow is shorter than you. Protect yourself by wearing long sleeves, pants, a wide-brimmed hat, and sunglasses year round, whenever you are outdoors.  . Once a month, do a whole body skin exam, using a mirror to look at the skin on your back. Tell your health care provider of new moles, moles that have irregular borders, moles that are larger than a pencil eraser, or moles that have changed in shape or color.

## 2019-04-23 NOTE — Progress Notes (Signed)
PCP notes:   Health maintenance:  Eye exam - addressed Tetanus vaccine - postponed/insurance  Abnormal screenings:   None  Patient concerns:   None  Nurse concerns:  None  Next PCP appt:   04/30/19 @ 1130

## 2019-04-30 ENCOUNTER — Ambulatory Visit (INDEPENDENT_AMBULATORY_CARE_PROVIDER_SITE_OTHER): Payer: Medicare Other | Admitting: Family Medicine

## 2019-04-30 ENCOUNTER — Other Ambulatory Visit: Payer: Self-pay

## 2019-04-30 ENCOUNTER — Encounter: Payer: Self-pay | Admitting: Family Medicine

## 2019-04-30 VITALS — BP 140/60 | HR 82 | Temp 98.7°F | Ht 70.5 in | Wt 187.2 lb

## 2019-04-30 DIAGNOSIS — Z1211 Encounter for screening for malignant neoplasm of colon: Secondary | ICD-10-CM | POA: Diagnosis not present

## 2019-04-30 DIAGNOSIS — R634 Abnormal weight loss: Secondary | ICD-10-CM | POA: Diagnosis not present

## 2019-04-30 DIAGNOSIS — I69954 Hemiplegia and hemiparesis following unspecified cerebrovascular disease affecting left non-dominant side: Secondary | ICD-10-CM | POA: Diagnosis not present

## 2019-04-30 DIAGNOSIS — I10 Essential (primary) hypertension: Secondary | ICD-10-CM | POA: Diagnosis not present

## 2019-04-30 DIAGNOSIS — E1165 Type 2 diabetes mellitus with hyperglycemia: Secondary | ICD-10-CM

## 2019-04-30 DIAGNOSIS — N289 Disorder of kidney and ureter, unspecified: Secondary | ICD-10-CM | POA: Diagnosis not present

## 2019-04-30 DIAGNOSIS — N400 Enlarged prostate without lower urinary tract symptoms: Secondary | ICD-10-CM

## 2019-04-30 DIAGNOSIS — Z7189 Other specified counseling: Secondary | ICD-10-CM | POA: Diagnosis not present

## 2019-04-30 DIAGNOSIS — E118 Type 2 diabetes mellitus with unspecified complications: Secondary | ICD-10-CM | POA: Diagnosis not present

## 2019-04-30 DIAGNOSIS — E785 Hyperlipidemia, unspecified: Secondary | ICD-10-CM

## 2019-04-30 DIAGNOSIS — IMO0002 Reserved for concepts with insufficient information to code with codable children: Secondary | ICD-10-CM

## 2019-04-30 DIAGNOSIS — D509 Iron deficiency anemia, unspecified: Secondary | ICD-10-CM | POA: Diagnosis not present

## 2019-04-30 LAB — CBC WITH DIFFERENTIAL/PLATELET
Basophils Relative: 0 % (ref 0.0–3.0)
Eosinophils Relative: 6 % — ABNORMAL HIGH (ref 0.0–5.0)
HCT: 35.5 % — ABNORMAL LOW (ref 39.0–52.0)
Hemoglobin: 11.6 g/dL — ABNORMAL LOW (ref 13.0–17.0)
Lymphocytes Relative: 19 % (ref 12.0–46.0)
MCHC: 32.8 g/dL (ref 30.0–36.0)
MCV: 70.8 fl — ABNORMAL LOW (ref 78.0–100.0)
Monocytes Relative: 9 % (ref 3.0–12.0)
Neutrophils Relative %: 66 % (ref 43.0–77.0)
Platelets: 312 10*3/uL (ref 150.0–400.0)
RBC: 5.02 Mil/uL (ref 4.22–5.81)
RDW: 20.6 % — ABNORMAL HIGH (ref 11.5–15.5)
WBC: 6.7 10*3/uL (ref 4.0–10.5)

## 2019-04-30 LAB — LIPID PANEL
Cholesterol: 107 mg/dL (ref 0–200)
HDL: 52.7 mg/dL (ref >39.00)
LDL Cholesterol: 44 mg/dL (ref 0–99)
NonHDL: 54.54
Total CHOL/HDL Ratio: 2
Triglycerides: 52 mg/dL (ref 0.0–149.0)
VLDL: 10.4 mg/dL (ref 0.0–40.0)

## 2019-04-30 LAB — COMPREHENSIVE METABOLIC PANEL
ALT: 21 U/L (ref 0–53)
AST: 30 U/L (ref 0–37)
Albumin: 3.7 g/dL (ref 3.5–5.2)
Alkaline Phosphatase: 164 U/L — ABNORMAL HIGH (ref 39–117)
BUN: 14 mg/dL (ref 6–23)
CO2: 29 mEq/L (ref 19–32)
Calcium: 9 mg/dL (ref 8.4–10.5)
Chloride: 100 mEq/L (ref 96–112)
Creatinine, Ser: 1.27 mg/dL (ref 0.40–1.50)
GFR: 66.49 mL/min (ref >60.00)
Glucose, Bld: 181 mg/dL — ABNORMAL HIGH (ref 70–99)
Potassium: 3.8 mEq/L (ref 3.5–5.1)
Sodium: 139 mEq/L (ref 135–145)
Total Bilirubin: 0.5 mg/dL (ref 0.2–1.2)
Total Protein: 7.6 g/dL (ref 6.0–8.3)

## 2019-04-30 LAB — POC HEMOCCULT BLD/STL (OFFICE/1-CARD/DIAGNOSTIC): Fecal Occult Blood, POC: NEGATIVE

## 2019-04-30 LAB — TSH: TSH: 2.48 u[IU]/mL (ref 0.35–4.50)

## 2019-04-30 LAB — IBC PANEL
Iron: 16 ug/dL — ABNORMAL LOW (ref 42–165)
Saturation Ratios: 4.5 % — ABNORMAL LOW (ref 20.0–50.0)
Transferrin: 253 mg/dL (ref 212.0–360.0)

## 2019-04-30 LAB — HEMOGLOBIN A1C: Hgb A1c MFr Bld: 8.1 % — ABNORMAL HIGH (ref 4.6–6.5)

## 2019-04-30 LAB — FERRITIN: Ferritin: 6.8 ng/mL — ABNORMAL LOW (ref 22.0–322.0)

## 2019-04-30 LAB — PSA: PSA: 3.03 ng/mL (ref 0.10–4.00)

## 2019-04-30 NOTE — Assessment & Plan Note (Signed)
Chronic, stable. Continue current regimen. 

## 2019-04-30 NOTE — Patient Instructions (Addendum)
Labs today We will be in touch with results.  Watch weight, I'm concerned about unexpected drop in weight.  Consider colonoscopy.  Return in 3 months for diabetes follow up visit.   Health Maintenance After Age 77 After age 68, you are at a higher risk for certain long-term diseases and infections as well as injuries from falls. Falls are a major cause of broken bones and head injuries in people who are older than age 55. Getting regular preventive care can help to keep you healthy and well. Preventive care includes getting regular testing and making lifestyle changes as recommended by your health care provider. Talk with your health care provider about:  Which screenings and tests you should have. A screening is a test that checks for a disease when you have no symptoms.  A diet and exercise plan that is right for you. What should I know about screenings and tests to prevent falls? Screening and testing are the best ways to find a health problem early. Early diagnosis and treatment give you the best chance of managing medical conditions that are common after age 50. Certain conditions and lifestyle choices may make you more likely to have a fall. Your health care provider may recommend:  Regular vision checks. Poor vision and conditions such as cataracts can make you more likely to have a fall. If you wear glasses, make sure to get your prescription updated if your vision changes.  Medicine review. Work with your health care provider to regularly review all of the medicines you are taking, including over-the-counter medicines. Ask your health care provider about any side effects that may make you more likely to have a fall. Tell your health care provider if any medicines that you take make you feel dizzy or sleepy.  Osteoporosis screening. Osteoporosis is a condition that causes the bones to get weaker. This can make the bones weak and cause them to break more easily.  Blood pressure screening.  Blood pressure changes and medicines to control blood pressure can make you feel dizzy.  Strength and balance checks. Your health care provider may recommend certain tests to check your strength and balance while standing, walking, or changing positions.  Foot health exam. Foot pain and numbness, as well as not wearing proper footwear, can make you more likely to have a fall.  Depression screening. You may be more likely to have a fall if you have a fear of falling, feel emotionally low, or feel unable to do activities that you used to do.  Alcohol use screening. Using too much alcohol can affect your balance and may make you more likely to have a fall. What actions can I take to lower my risk of falls? General instructions  Talk with your health care provider about your risks for falling. Tell your health care provider if: ? You fall. Be sure to tell your health care provider about all falls, even ones that seem minor. ? You feel dizzy, sleepy, or off-balance.  Take over-the-counter and prescription medicines only as told by your health care provider. These include any supplements.  Eat a healthy diet and maintain a healthy weight. A healthy diet includes low-fat dairy products, low-fat (lean) meats, and fiber from whole grains, beans, and lots of fruits and vegetables. Home safety  Remove any tripping hazards, such as rugs, cords, and clutter.  Install safety equipment such as grab bars in bathrooms and safety rails on stairs.  Keep rooms and walkways well-lit. Activity   Follow  a regular exercise program to stay fit. This will help you maintain your balance. Ask your health care provider what types of exercise are appropriate for you.  If you need a cane or walker, use it as recommended by your health care provider.  Wear supportive shoes that have nonskid soles. Lifestyle  Do not drink alcohol if your health care provider tells you not to drink.  If you drink alcohol, limit  how much you have: ? 0-1 drink a day for women. ? 0-2 drinks a day for men.  Be aware of how much alcohol is in your drink. In the [place] one drink equals one typical bottle of beer (12 oz), one-half glass of wine (5 oz), or one shot of hard liquor (1 oz).  Do not use any products that contain nicotine or tobacco, such as cigarettes and e-cigarettes. If you need help quitting, ask your health care provider. Summary  Having a healthy lifestyle and getting preventive care can help to protect your health and wellness after age 77.  Screening and testing are the best way to find a health problem early and help you avoid having a fall. Early diagnosis and treatment give you the best chance for managing medical conditions that are more common for people who are older than age 77.  Falls are a major cause of broken bones and head injuries in people who are older than age 77. Take precautions to prevent a fall at home.  Work with your health care provider to learn what changes you can make to improve your health and wellness and to prevent falls. This information is not intended to replace advice given to you by your health care provider. Make sure you discuss any questions you have with your health care provider. Document Released: 09/17/2017 Document Revised: 09/17/2017 Document Reviewed: 09/17/2017 Elsevier Interactive Patient Education  2019 [employer]

## 2019-04-30 NOTE — Assessment & Plan Note (Addendum)
Advanced directives - would want sister, Marcus Downs to be HCPOA. Advanced packet previously provided, again today. Needs assistance filling this out.

## 2019-04-30 NOTE — Assessment & Plan Note (Signed)
Another 3 lb weight loss over last 3 months, reviewed with patient. Will check labwork today. Continues to decline further evaluation at this time.

## 2019-04-30 NOTE — Assessment & Plan Note (Signed)
DRE stable. Update PSA. Denies significant symptoms.

## 2019-04-30 NOTE — Assessment & Plan Note (Signed)
Pt has declined GI eval despite known IDA.  Update labs.

## 2019-04-30 NOTE — Assessment & Plan Note (Signed)
Update FLP on statin.  The ASCVD Risk score Marcus Bussing DC Jr., et al., 2013) failed to calculate for the following reasons:   The valid total cholesterol range is 130 to 320 mg/dL

## 2019-04-30 NOTE — Assessment & Plan Note (Addendum)
Chronic, last visit we started jardiance and tolerating well without UTI or yeast infection symptoms. Will update A1c today. Bowels seem to be doing better on lower metformin XL dose. Never established with podiatry.

## 2019-04-30 NOTE — Progress Notes (Signed)
This visit was conducted in person.  BP 140/60 (BP Location: Right Arm, Patient Position: Sitting, Cuff Size: Normal)   Pulse 82   Temp 98.7 F (37.1 C) (Oral)   Ht 5' 10.5" (1.791 m)   Wt 187 lb 3 oz (84.9 kg)   SpO2 96%   BMI 26.48 kg/m    CC: AMW f/u visit Subjective:    Patient ID: Marcus Downs., male    DOB: 1941/01/14, 78 y.o.   MRN: [id number]  HPI: Marcus Downs. is a 78 y.o. male presenting on 04/30/2019 for Annual Exam (Pt 2. )   Has new aide Jeb, whom he likes, helping him at home.   Saw Lesia last week for medicare wellness visit. Note reviewed.   Another 3 lbs down.   DM - does regularly check sugars aide helps due to hemiparesis - checks breakfast postprandial - 100-200s. Compliant with antihyperglycemic regimen which includes: novolog 70/30 25/22u, metformin XR 500mg  daily (decreased due to diarrhea) and new start of jardiance 10mg  daily. Denies low sugars or hypoglycemic symptoms. Denies paresthesias. Last diabetic eye exam 03/2018. Pneumovax: 2011. Prevnar: 2015. Glucometer brand: unsure. DSME: declined. Has still not seen podiatry.  Lab Results  Component Value Date   HGBA1C 8.2 (A) 01/13/2019   Diabetic Foot Exam - Simple   No data filed     Lab Results  Component Value Date   MICROALBUR 8.2 (H) 09/01/2012     Preventative: Colon cancer screening - never completed colonoscopy. Declines iFOB as well - difficulty completing 2/2hemiparesis. In office hemoccult today. Requests prostate check yearly. Brother in law died from prostate cancer.Discussed stopping screening - but may continue for colon cancer screening as per above.  Fluyearly  Td 2010  Pneumovax 2011, prevnar 05/2014  zostavax- 08/2012  Advanced directives - would want sister, Beverlee Nims to be HCPOA. Advanced packet previously provided, again today. Needs assistance filling this out.  Seat belt use discussed.  Sunscreen use discussed. No changing moles on skin.  Ex smoker - quit 1997.   Alcohol - none Dentist doesn't see - has full dentures Eye exam yearly  Bowel - some constipation Bladder - incontinence, regularly wears depens up to 2 a day. No trouble at night.   Caffeine: neg Lives alone. Friend comes and helps sometimes. Son's brother in law helps with medicines. Family nearby - 2 sisters and nieces/nephews in area. Has nurse aide who helps at home.  Activity:no regular exercise Diet: good water, fruits/vegetables daily     Relevant past medical, surgical, family and social history reviewed and updated as indicated. Interim medical history since our last visit reviewed. Allergies and medications reviewed and updated. Outpatient Medications Prior to Visit  Medication Sig Dispense Refill  . amLODipine-atorvastatin (CADUET) 10-40 MG tablet TAKE 1 TABLET BY MOUTH ONCE DAILY 90 tablet 1  . aspirin 325 MG EC tablet Take 325 mg by mouth daily. For stroke prevention      . diclofenac sodium (VOLTAREN) 1 % GEL Apply 1 application topically 3 (three) times daily as needed. 1 Tube 3  . diltiazem (TIAZAC) 360 MG 24 hr capsule Take 1 capsule by mouth once daily 90 capsule 0  . empagliflozin (JARDIANCE) 10 MG TABS tablet Take 10 mg by mouth daily. 30 tablet 6  . ferrous sulfate 325 (65 FE) MG tablet Take 1 tablet (325 mg total) by mouth every other day.  3  . glucose blood (EMBRACE BLOOD GLUCOSE TEST) test strip Check three times daily  and when feeling ill E11.8 insulin dependent 100 each 12  . hydrochlorothiazide (HYDRODIURIL) 25 MG tablet Take 1 tablet (25 mg total) by mouth daily. 90 tablet 0  . insulin aspart protamine- aspart (NOVOLOG MIX 70/30) (70-30) 100 UNIT/ML injection Take 25 units with breakfast and 22 units with supper. QS 1 month 20 mL 11  . latanoprost (XALATAN) 0.005 % ophthalmic solution Place 1 drop into both eyes at bedtime.    . Loperamide HCl (IMODIUM PO) Take 1 tablet by mouth as needed.     Marland Kitchen. losartan (COZAAR) 100 MG tablet TAKE 1 TABLET BY MOUTH ONCE  DAILY 90 tablet 1  . metFORMIN (GLUCOPHAGE-XR) 500 MG 24 hr tablet Take 1 tablet (500 mg total) by mouth daily with breakfast. 30 tablet 6  . potassium chloride (K-DUR) 10 MEQ tablet TAKE 3 TABLETS BY MOUTH ONCE DAILY 270 tablet 3   No facility-administered medications prior to visit.      Per HPI unless specifically indicated in ROS section below Review of Systems Objective:    BP 140/60 (BP Location: Right Arm, Patient Position: Sitting, Cuff Size: Normal)   Pulse 82   Temp 98.7 F (37.1 C) (Oral)   Ht 5' 10.5" (1.791 m)   Wt 187 lb 3 oz (84.9 kg)   SpO2 96%   BMI 26.48 kg/m   Wt Readings from Last 3 Encounters:  04/30/19 187 lb 3 oz (84.9 kg)  01/13/19 190 lb 9 oz (86.4 kg)  10/18/18 205 lb (93 kg)    Physical Exam Vitals signs and nursing note reviewed.  Constitutional:      General: He is not in acute distress.    Appearance: Normal appearance. He is well-developed.  HENT:     Head: Normocephalic and atraumatic.     Right Ear: External ear normal.     Left Ear: External ear normal.     Mouth/Throat:     Mouth: Mucous membranes are moist.     Pharynx: No oropharyngeal exudate or posterior oropharyngeal erythema.  Eyes:     General: No scleral icterus.    Extraocular Movements: Extraocular movements intact.     Conjunctiva/sclera: Conjunctivae normal.     Pupils: Pupils are equal, round, and reactive to light.  Neck:     Musculoskeletal: Normal range of motion and neck supple.  Cardiovascular:     Rate and Rhythm: Normal rate and regular rhythm.     Pulses: Normal pulses.     Heart sounds: Normal heart sounds. No murmur.  Pulmonary:     Effort: Pulmonary effort is normal. No respiratory distress.     Breath sounds: Normal breath sounds. No wheezing or rales.  Genitourinary:    Prostate: Enlarged (30+gm). Not tender and no nodules present.     Rectum: Normal. Guaiac result negative. No mass, tenderness, anal fissure, external hemorrhoid or internal hemorrhoid.  Normal anal tone.  Musculoskeletal:     Right lower leg: Edema (tr) present.     Left lower leg: Edema (tr) present.     Comments: See HPI for foot exam if done L foot drop brace in place  Lymphadenopathy:     Cervical: No cervical adenopathy.  Skin:    General: Skin is warm and dry.     Capillary Refill: Capillary refill takes less than 2 seconds.     Findings: No rash.  Neurological:     Mental Status: He is alert.     Comments: Chronic L hemiplegia  Psychiatric:  Mood and Affect: Mood normal.        Behavior: Behavior normal.       Results for orders placed or performed in visit on 04/30/19  POC Hemoccult Bld/Stl (1-Cd Office Dx)  Result Value Ref Range   Card #1 Date 04/30/2019    Fecal Occult Blood, POC Negative Negative   Assessment & Plan:   Problem List Items Addressed This Visit    Weight loss    Another 3 lb weight loss over last 3 months, reviewed with patient. Will check labwork today. Continues to decline further evaluation at this time.       Relevant Orders   POC Hemoccult Bld/Stl (1-Cd Office Dx) (Completed)   Renal insufficiency   Iron deficiency anemia    Pt has declined GI eval despite known IDA.  Update labs.       Relevant Orders   CBC with Differential/Platelet   IBC panel   Ferritin   POC Hemoccult Bld/Stl (1-Cd Office Dx) (Completed)   HLD (hyperlipidemia)    Update FLP on statin.  The ASCVD Risk score Denman George(Goff DC Jr., et al., 2013) failed to calculate for the following reasons:   The valid total cholesterol range is 130 to 320 mg/dL       Relevant Orders   Lipid panel   Comprehensive metabolic panel   TSH   Hemiplegia, late effect of cerebrovascular disease (HCC)   Essential hypertension    Chronic, stable. Continue current regimen.       Diabetes mellitus type 2, uncontrolled, with complications (HCC)    Chronic, last visit we started jardiance and tolerating well without UTI or yeast infection symptoms. Will update A1c today.  Bowels seem to be doing better on lower metformin XL dose. Never established with podiatry.       Relevant Orders   Hemoglobin A1c   Benign prostatic hyperplasia    DRE stable. Update PSA. Denies significant symptoms.       Relevant Orders   PSA   Advanced care planning/counseling discussion - Primary    Advanced directives - would want sister, Lafonda MossesDiana to be HCPOA. Advanced packet previously provided, again today. Needs assistance filling this out.        Other Visit Diagnoses    Special screening for malignant neoplasms, colon       Relevant Orders   POC Hemoccult Bld/Stl (1-Cd Office Dx) (Completed)       No orders of the defined types were placed in this encounter.  Orders Placed This Encounter  Procedures  . Lipid panel  . Comprehensive metabolic panel  . TSH  . Hemoglobin A1c  . PSA  . CBC with Differential/Platelet  . IBC panel  . Ferritin  . POC Hemoccult Bld/Stl (1-Cd Office Dx)    Follow up plan: Return in about 3 months (around 07/31/2019) for follow up visit.  Eustaquio BoydenJavier Adolpho Meenach, MD

## 2019-05-03 ENCOUNTER — Other Ambulatory Visit: Payer: Self-pay | Admitting: Family Medicine

## 2019-05-03 ENCOUNTER — Ambulatory Visit: Payer: Medicare Other | Admitting: Podiatry

## 2019-05-03 ENCOUNTER — Telehealth: Payer: Self-pay | Admitting: Family Medicine

## 2019-05-03 DIAGNOSIS — D509 Iron deficiency anemia, unspecified: Secondary | ICD-10-CM

## 2019-05-03 NOTE — Telephone Encounter (Signed)
Best number [phone] Pt returned your call

## 2019-05-03 NOTE — Telephone Encounter (Signed)
Spoke with pt relaying results.  [See Labs Result notes, 04/30/19.]

## 2019-05-12 ENCOUNTER — Telehealth: Payer: Self-pay | Admitting: Family Medicine

## 2019-05-12 NOTE — Telephone Encounter (Signed)
[facility] [employer] packet mailed to pt.

## 2019-05-12 NOTE — Telephone Encounter (Signed)
Noted. We have repeatedly discussed this and I have recommended GI eval. He agrees when I talk with him about it but then cancels appt or declines eval when we try to schedule him. Appreciate Financial Aid info for patient.

## 2019-05-12 NOTE — Telephone Encounter (Signed)
Called pt to schedule his GI referral.  Pt refused to schedule until Sep 2020.  He states he has transportation and financial issues.  Offered to send [place] form.  Pt states he really doesn't want to go to appt. [place]

## 2019-05-17 NOTE — Progress Notes (Signed)
I reviewed health advisor's note, was available for consultation, and agree with documentation and plan.  

## 2019-06-17 ENCOUNTER — Other Ambulatory Visit: Payer: Self-pay | Admitting: Family Medicine

## 2019-06-19 ENCOUNTER — Other Ambulatory Visit: Payer: Self-pay | Admitting: Family Medicine

## 2019-06-27 ENCOUNTER — Other Ambulatory Visit: Payer: Self-pay | Admitting: Family Medicine

## 2019-07-01 ENCOUNTER — Other Ambulatory Visit: Payer: Self-pay | Admitting: Family Medicine

## 2019-08-02 ENCOUNTER — Other Ambulatory Visit: Payer: Self-pay

## 2019-08-02 ENCOUNTER — Encounter: Payer: Self-pay | Admitting: Family Medicine

## 2019-08-02 ENCOUNTER — Ambulatory Visit (INDEPENDENT_AMBULATORY_CARE_PROVIDER_SITE_OTHER): Payer: Medicare Other | Admitting: Family Medicine

## 2019-08-02 VITALS — BP 130/62 | HR 86 | Temp 98.1°F | Ht 70.5 in | Wt 186.4 lb

## 2019-08-02 DIAGNOSIS — E118 Type 2 diabetes mellitus with unspecified complications: Secondary | ICD-10-CM | POA: Diagnosis not present

## 2019-08-02 DIAGNOSIS — Z23 Encounter for immunization: Secondary | ICD-10-CM | POA: Diagnosis not present

## 2019-08-02 DIAGNOSIS — R634 Abnormal weight loss: Secondary | ICD-10-CM

## 2019-08-02 DIAGNOSIS — IMO0002 Reserved for concepts with insufficient information to code with codable children: Secondary | ICD-10-CM

## 2019-08-02 DIAGNOSIS — H401190 Primary open-angle glaucoma, unspecified eye, stage unspecified: Secondary | ICD-10-CM | POA: Diagnosis not present

## 2019-08-02 DIAGNOSIS — I69954 Hemiplegia and hemiparesis following unspecified cerebrovascular disease affecting left non-dominant side: Secondary | ICD-10-CM

## 2019-08-02 DIAGNOSIS — D509 Iron deficiency anemia, unspecified: Secondary | ICD-10-CM

## 2019-08-02 DIAGNOSIS — E1165 Type 2 diabetes mellitus with hyperglycemia: Secondary | ICD-10-CM | POA: Diagnosis not present

## 2019-08-02 LAB — POCT GLYCOSYLATED HEMOGLOBIN (HGB A1C): Hemoglobin A1C: 7.2 % — AB (ref 4.0–5.6)

## 2019-08-02 MED ORDER — POTASSIUM CHLORIDE ER 10 MEQ PO TBCR: 30.0000 meq | EXTENDED_RELEASE_TABLET | Freq: Every day | ORAL | 1 refills | Status: DC

## 2019-08-02 MED ORDER — METFORMIN HCL ER 500 MG PO TB24: 500.0000 mg | ORAL_TABLET | Freq: Every day | ORAL | 1 refills | Status: DC

## 2019-08-02 MED ORDER — DILTIAZEM HCL ER BEADS 360 MG PO CP24: 360.0000 mg | ORAL_CAPSULE | Freq: Every day | ORAL | 1 refills | Status: DC

## 2019-08-02 NOTE — Assessment & Plan Note (Addendum)
Continues to decline GI eval. Had stopped oral iron - advised restart. Will continue to monitor weight.

## 2019-08-02 NOTE — Assessment & Plan Note (Signed)
Consider imaging if ongoing weight loss.

## 2019-08-02 NOTE — Patient Instructions (Addendum)
Flu shot today.  A1c today.  Restart iron every other day.  Double check to ensure you're taking cardizem CD 360mg  daily - let me know if not taking. Medicines refilled today.  Have eye doctor send me report next month.  Return in 3 months for diabetes follow up visit.

## 2019-08-02 NOTE — Progress Notes (Signed)
This visit was conducted in person.  BP 130/62 (BP Location: Right Arm, Patient Position: Sitting, Cuff Size: Normal)   Pulse 86   Temp 98.1 F (36.7 C) (Temporal)   Ht 5' 10.5" (1.791 m)   Wt 186 lb 6 oz (84.5 kg)   SpO2 96%   BMI 26.36 kg/m    CC: 3 mo f/u visit Subjective:    Patient ID: Marcus LankSamuel J Sawka Jr., male    DOB: 02/17/1941, 78 y.o.   MRN: [id number]  HPI: Marcus LankSamuel J Capote Jr. is a 78 y.o. male presenting on 08/02/2019 for Follow-up (Here for 3 mo f/u.)   Long overdue for colon cancer screening - has declined multiple attempts to refer to GI for further evaluation of weight loss and iron deficiency anemia. He forgets to take iron every other day. In office hemoccult negative 04/30/2019.   DM - does not regularly check sugars. Compliant with antihyperglycemic regimen which includes: novolog 70/30 25/22u daily, metformin XR 500mg  daily, jardiance 10mg  daily. Denies low sugars or hypoglycemic symptoms. Denies paresthesias. Last diabetic eye exam DUE. Pneumovax: 2011. Prevnar: 2015. Glucometer brand: unsure. DSME: declined. Has not seen podiatry yet - per chart he cancelled/no showed 3 different appts. He states they called him and cancelled all future appointments Lab Results  Component Value Date   HGBA1C 8.1 (H) 04/30/2019   Diabetic Foot Exam - Simple   Simple Foot Form Diabetic Foot exam was performed with the following findings: Yes 08/02/2019 12:02 PM  Visual Inspection See comments: Yes Sensation Testing See comments: Yes Pulse Check See comments: Yes Comments Diminished sensation to monofilament testing. Thickened nails throughout Scaling throughout bilateral toes without maceration     Lab Results  Component Value Date   MICROALBUR 8.2 (H) 09/01/2012       Relevant past medical, surgical, family and social history reviewed and updated as indicated. Interim medical history since our last visit reviewed. Allergies and medications reviewed and updated.  Outpatient Medications Prior to Visit  Medication Sig Dispense Refill  . amLODipine-atorvastatin (CADUET) 10-40 MG tablet TAKE 1 TABLET BY MOUTH EVERY DAY 90 tablet 2  . aspirin 325 MG EC tablet Take 325 mg by mouth daily. For stroke prevention      . diclofenac sodium (VOLTAREN) 1 % GEL Apply 1 application topically 3 (three) times daily as needed. 1 Tube 3  . ferrous sulfate 325 (65 FE) MG tablet Take 1 tablet (325 mg total) by mouth every other day.  3  . glucose blood (EMBRACE BLOOD GLUCOSE TEST) test strip Check three times daily and when feeling ill E11.8 insulin dependent 100 each 12  . hydrochlorothiazide (HYDRODIURIL) 25 MG tablet TAKE 1 TABLET BY MOUTH EVERY DAY 90 tablet 3  . insulin aspart protamine- aspart (NOVOLOG MIX 70/30) (70-30) 100 UNIT/ML injection Take 25 units with breakfast and 22 units with supper. QS 1 month 20 mL 11  . JARDIANCE 10 MG TABS tablet TAKE 1 TABLET BY MOUTH EVERY DAY 90 tablet 2  . latanoprost (XALATAN) 0.005 % ophthalmic solution Place 1 drop into both eyes at bedtime.    . Loperamide HCl (IMODIUM PO) Take 1 tablet by mouth as needed.     Marland Kitchen. losartan (COZAAR) 100 MG tablet TAKE 1 TABLET BY MOUTH EVERY DAY 90 tablet 2  . diltiazem (TIAZAC) 360 MG 24 hr capsule Take 1 capsule by mouth once daily 90 capsule 0  . metFORMIN (GLUCOPHAGE-XR) 500 MG 24 hr tablet TAKE 1 TABLET BY MOUTH EVERY  DAY WITH BREAKFAST 90 tablet 0  . potassium chloride (K-DUR) 10 MEQ tablet TAKE 3 TABLETS BY MOUTH EVERY DAY 270 tablet 0   No facility-administered medications prior to visit.      Per HPI unless specifically indicated in ROS section below Review of Systems Objective:    BP 130/62 (BP Location: Right Arm, Patient Position: Sitting, Cuff Size: Normal)   Pulse 86   Temp 98.1 F (36.7 C) (Temporal)   Ht 5' 10.5" (1.791 m)   Wt 186 lb 6 oz (84.5 kg)   SpO2 96%   BMI 26.36 kg/m   Wt Readings from Last 3 Encounters:  08/02/19 186 lb 6 oz (84.5 kg)  04/30/19 187 lb 3 oz  (84.9 kg)  01/13/19 190 lb 9 oz (86.4 kg)    Physical Exam Vitals signs and nursing note reviewed.  Constitutional:      General: He is not in acute distress.    Appearance: Normal appearance. He is well-developed. He is not ill-appearing.     Comments: In wheelchair with residual L hemiparesis  HENT:     Head: Normocephalic and atraumatic.     Right Ear: External ear normal.     Left Ear: External ear normal.     Nose: Nose normal.     Mouth/Throat:     Pharynx: No oropharyngeal exudate.  Eyes:     General: No scleral icterus.    Conjunctiva/sclera: Conjunctivae normal.     Pupils: Pupils are equal, round, and reactive to light.  Neck:     Musculoskeletal: Normal range of motion and neck supple.  Cardiovascular:     Rate and Rhythm: Normal rate and regular rhythm.     Heart sounds: Normal heart sounds. No murmur.  Pulmonary:     Effort: Pulmonary effort is normal. No respiratory distress.     Breath sounds: Normal breath sounds. No wheezing or rales.  Musculoskeletal:     Comments: See HPI for foot exam if done  Lymphadenopathy:     Cervical: No cervical adenopathy.  Skin:    Findings: No rash.  Neurological:     Mental Status: He is alert.     Comments: Chronic L hemiparesis       Results for orders placed or performed in visit on 04/30/19  Lipid panel  Result Value Ref Range   Cholesterol 107 0 - 200 mg/dL   Triglycerides 93.2 0.0 - 149.0 mg/dL   HDL 67.12 >45.80 mg/dL   VLDL 99.8 0.0 - 33.8 mg/dL   LDL Cholesterol 44 0 - 99 mg/dL   Total CHOL/HDL Ratio 2    NonHDL 54.54   Comprehensive metabolic panel  Result Value Ref Range   Sodium 139 135 - 145 mEq/L   Potassium 3.8 3.5 - 5.1 mEq/L   Chloride 100 96 - 112 mEq/L   CO2 29 19 - 32 mEq/L   Glucose, Bld 181 (H) 70 - 99 mg/dL   BUN 14 6 - 23 mg/dL   Creatinine, Ser 2.50 0.40 - 1.50 mg/dL   Total Bilirubin 0.5 0.2 - 1.2 mg/dL   Alkaline Phosphatase 164 (H) 39 - 117 U/L   AST 30 0 - 37 U/L   ALT 21 0 - 53  U/L   Total Protein 7.6 6.0 - 8.3 g/dL   Albumin 3.7 3.5 - 5.2 g/dL   Calcium 9.0 8.4 - 53.9 mg/dL   GFR 76.73 >41.93 mL/min  TSH  Result Value Ref Range   TSH 2.48 0.35 -  4.50 uIU/mL  Hemoglobin A1c  Result Value Ref Range   Hgb A1c MFr Bld 8.1 (H) 4.6 - 6.5 %  PSA  Result Value Ref Range   PSA 3.03 0.10 - 4.00 ng/mL  CBC with Differential/Platelet  Result Value Ref Range   WBC 6.7 4.0 - 10.5 K/uL   RBC 5.02 4.22 - 5.81 Mil/uL   Hemoglobin 11.6 (L) 13.0 - 17.0 g/dL   HCT 16.135.5 (L) 09.639.0 - 04.552.0 %   MCV 70.8 (L) 78.0 - 100.0 fl   MCHC 32.8 30.0 - 36.0 g/dL   RDW 40.920.6 (H) 81.111.5 - 91.415.5 %   Platelets 312.0 150.0 - 400.0 K/uL   Neutrophils Relative % 66.0 43.0 - 77.0 %   Lymphocytes Relative 19.0 12.0 - 46.0 %   Monocytes Relative 9.0 3.0 - 12.0 %   Eosinophils Relative 6.0 (H) 0.0 - 5.0 %   Basophils Relative 0.0 0.0 - 3.0 %  IBC panel  Result Value Ref Range   Iron 16 (L) 42 - 165 ug/dL   Transferrin 782.9253.0 562.1212.0 - 360.0 mg/dL   Saturation Ratios 4.5 (L) 20.0 - 50.0 %  Ferritin  Result Value Ref Range   Ferritin 6.8 (L) 22.0 - 322.0 ng/mL  POC Hemoccult Bld/Stl (1-Cd Office Dx)  Result Value Ref Range   Card #1 Date 04/30/2019    Fecal Occult Blood, POC Negative Negative   Assessment & Plan:   Problem List Items Addressed This Visit    Weight loss    Consider imaging if ongoing weight loss.       Primary open angle glaucoma    Pt states has upcoming appt next month.       Iron deficiency anemia    Continues to decline GI eval. Had stopped oral iron - advised restart. Will continue to monitor weight.       Hemiplegia, late effect of cerebrovascular disease (HCC)   Diabetes mellitus type 2, uncontrolled, with complications (HCC) - Primary    Chronic, update A1c with new addition of jardiance. Foot exam today. Encouraged keep podiatry f/u in the future. New referral placed.       Relevant Medications   metFORMIN (GLUCOPHAGE-XR) 500 MG 24 hr tablet   Other Relevant  Orders   POCT glycosylated hemoglobin (Hb A1C)   Ambulatory referral to Podiatry    Other Visit Diagnoses    Need for influenza vaccination       Relevant Orders   Flu Vaccine QUAD High Dose(Fluad) (Completed)       Meds ordered this encounter  Medications  . diltiazem (TIAZAC) 360 MG 24 hr capsule    Sig: Take 1 capsule (360 mg total) by mouth daily.    Dispense:  90 capsule    Refill:  1  . potassium chloride (K-DUR) 10 MEQ tablet    Sig: Take 3 tablets (30 mEq total) by mouth daily.    Dispense:  270 tablet    Refill:  1  . metFORMIN (GLUCOPHAGE-XR) 500 MG 24 hr tablet    Sig: Take 1 tablet (500 mg total) by mouth daily with breakfast.    Dispense:  90 tablet    Refill:  1   Orders Placed This Encounter  Procedures  . Flu Vaccine QUAD High Dose(Fluad)  . Ambulatory referral to Podiatry    Referral Priority:   Routine    Referral Type:   Consultation    Referral Reason:   Specialty Services Required    Requested Specialty:  Podiatry    Number of Visits Requested:   1  . POCT glycosylated hemoglobin (Hb A1C)    Follow up plan: Return in about 3 months (around 11/01/2019) for annual exam, prior fasting for blood work.  Ria Bush, MD

## 2019-08-02 NOTE — Assessment & Plan Note (Addendum)
Pt states has upcoming appt next month.

## 2019-08-02 NOTE — Assessment & Plan Note (Signed)
Chronic, update A1c with new addition of jardiance. Foot exam today. Encouraged keep podiatry f/u in the future. New referral placed.

## 2019-08-05 ENCOUNTER — Encounter: Payer: Self-pay | Admitting: Family Medicine

## 2019-08-05 ENCOUNTER — Other Ambulatory Visit: Payer: Self-pay | Admitting: Family Medicine

## 2019-08-05 DIAGNOSIS — E1136 Type 2 diabetes mellitus with diabetic cataract: Secondary | ICD-10-CM | POA: Insufficient documentation

## 2019-08-05 DIAGNOSIS — H401132 Primary open-angle glaucoma, bilateral, moderate stage: Secondary | ICD-10-CM

## 2019-08-16 ENCOUNTER — Encounter: Payer: Self-pay | Admitting: Podiatry

## 2019-08-16 ENCOUNTER — Other Ambulatory Visit: Payer: Self-pay

## 2019-08-16 ENCOUNTER — Ambulatory Visit (INDEPENDENT_AMBULATORY_CARE_PROVIDER_SITE_OTHER): Payer: Medicare Other | Admitting: Podiatry

## 2019-08-16 DIAGNOSIS — E1159 Type 2 diabetes mellitus with other circulatory complications: Secondary | ICD-10-CM | POA: Insufficient documentation

## 2019-08-16 DIAGNOSIS — M79675 Pain in left toe(s): Secondary | ICD-10-CM

## 2019-08-16 DIAGNOSIS — B351 Tinea unguium: Secondary | ICD-10-CM | POA: Insufficient documentation

## 2019-08-16 DIAGNOSIS — M79674 Pain in right toe(s): Secondary | ICD-10-CM

## 2019-08-16 NOTE — Progress Notes (Signed)
Complaint:  Visit Type: Patient returns to my office for continued preventative foot care services. Complaint: Patient states" my nails have grown long and thick and become painful to walk and wear shoes" Patient has been diagnosed with DM with vascular disease. The patient presents for preventative foot care services. No changes to ROS.  Patient has not been seen for years.  Patient has history of CVA.  Podiatric Exam: Vascular: dorsalis pedis are weakly palpable.  Posterior tibial pulses are absent with severe ankle swelling. Capillary return is immediate. Temperature gradient is WNL. Skin turgor WNL  Sensorium: deferred  Nail Exam: Pt has thick disfigured discolored nails with subungual debris noted bilateral entire nail hallux through fifth toenails Ulcer Exam: There is no evidence of ulcer or pre-ulcerative changes or infection. Orthopedic Exam: Muscle tone and strength are WNL. No limitations in general ROM. No crepitus or effusions noted. Foot type and digits show no abnormalities. Bony prominences are unremarkable. Skin: No Porokeratosis. No infection or ulcers  Diagnosis:  Onychomycosis, , Pain in right toe, pain in left toes  Treatment & Plan Procedures and Treatment: Consent by patient was obtained for treatment procedures.   Debridement of mycotic and hypertrophic toenails, 1 through 5 bilateral and clearing of subungual debris. No ulceration, no infection noted.  Return Visit-Office Procedure: Patient instructed to return to the office for a follow up visit 3 months for continued evaluation and treatment.    Gardiner Barefoot DPM

## 2019-09-18 ENCOUNTER — Other Ambulatory Visit: Payer: Self-pay | Admitting: Family Medicine

## 2019-11-01 ENCOUNTER — Ambulatory Visit (INDEPENDENT_AMBULATORY_CARE_PROVIDER_SITE_OTHER): Payer: Medicare Other | Admitting: Family Medicine

## 2019-11-01 ENCOUNTER — Other Ambulatory Visit: Payer: Self-pay

## 2019-11-01 ENCOUNTER — Encounter: Payer: Self-pay | Admitting: Family Medicine

## 2019-11-01 VITALS — BP 156/64 | HR 84 | Temp 98.3°F | Ht 70.5 in | Wt 185.1 lb

## 2019-11-01 DIAGNOSIS — R748 Abnormal levels of other serum enzymes: Secondary | ICD-10-CM | POA: Insufficient documentation

## 2019-11-01 DIAGNOSIS — D509 Iron deficiency anemia, unspecified: Secondary | ICD-10-CM | POA: Diagnosis not present

## 2019-11-01 DIAGNOSIS — IMO0002 Reserved for concepts with insufficient information to code with codable children: Secondary | ICD-10-CM

## 2019-11-01 DIAGNOSIS — I69954 Hemiplegia and hemiparesis following unspecified cerebrovascular disease affecting left non-dominant side: Secondary | ICD-10-CM | POA: Diagnosis not present

## 2019-11-01 DIAGNOSIS — I10 Essential (primary) hypertension: Secondary | ICD-10-CM | POA: Diagnosis not present

## 2019-11-01 DIAGNOSIS — R634 Abnormal weight loss: Secondary | ICD-10-CM

## 2019-11-01 DIAGNOSIS — E1159 Type 2 diabetes mellitus with other circulatory complications: Secondary | ICD-10-CM

## 2019-11-01 DIAGNOSIS — E118 Type 2 diabetes mellitus with unspecified complications: Secondary | ICD-10-CM | POA: Diagnosis not present

## 2019-11-01 DIAGNOSIS — N289 Disorder of kidney and ureter, unspecified: Secondary | ICD-10-CM | POA: Diagnosis not present

## 2019-11-01 DIAGNOSIS — E1165 Type 2 diabetes mellitus with hyperglycemia: Secondary | ICD-10-CM | POA: Diagnosis not present

## 2019-11-01 LAB — CBC WITH DIFFERENTIAL/PLATELET
Basophils Absolute: 0.1 10*3/uL (ref 0.0–0.1)
Basophils Relative: 1.2 % (ref 0.0–3.0)
Eosinophils Absolute: 0.2 10*3/uL (ref 0.0–0.7)
Eosinophils Relative: 3.4 % (ref 0.0–5.0)
HCT: 39.9 % (ref 39.0–52.0)
Hemoglobin: 12.9 g/dL — ABNORMAL LOW (ref 13.0–17.0)
Lymphocytes Relative: 20.1 % (ref 12.0–46.0)
Lymphs Abs: 1.5 10*3/uL (ref 0.7–4.0)
MCHC: 32.5 g/dL (ref 30.0–36.0)
MCV: 74.8 fl — ABNORMAL LOW (ref 78.0–100.0)
Monocytes Absolute: 0.8 10*3/uL (ref 0.1–1.0)
Monocytes Relative: 10.9 % (ref 3.0–12.0)
Neutro Abs: 4.7 10*3/uL (ref 1.4–7.7)
Neutrophils Relative %: 64.4 % (ref 43.0–77.0)
Platelets: 286 10*3/uL (ref 150.0–400.0)
RBC: 5.33 Mil/uL (ref 4.22–5.81)
RDW: 20.9 % — ABNORMAL HIGH (ref 11.5–15.5)
WBC: 7.2 10*3/uL (ref 4.0–10.5)

## 2019-11-01 LAB — COMPREHENSIVE METABOLIC PANEL
ALT: 23 U/L (ref 0–53)
AST: 28 U/L (ref 0–37)
Albumin: 3.8 g/dL (ref 3.5–5.2)
Alkaline Phosphatase: 220 U/L — ABNORMAL HIGH (ref 39–117)
BUN: 16 mg/dL (ref 6–23)
CO2: 29 mEq/L (ref 19–32)
Calcium: 9.2 mg/dL (ref 8.4–10.5)
Chloride: 101 mEq/L (ref 96–112)
Creatinine, Ser: 1.23 mg/dL (ref 0.40–1.50)
GFR: 68.9 mL/min (ref >60.00)
Glucose, Bld: 221 mg/dL — ABNORMAL HIGH (ref 70–99)
Potassium: 3.7 mEq/L (ref 3.5–5.1)
Sodium: 139 mEq/L (ref 135–145)
Total Bilirubin: 0.5 mg/dL (ref 0.2–1.2)
Total Protein: 7.8 g/dL (ref 6.0–8.3)

## 2019-11-01 LAB — POCT GLYCOSYLATED HEMOGLOBIN (HGB A1C): Hemoglobin A1C: 7.9 % — AB (ref 4.0–5.6)

## 2019-11-01 LAB — IBC PANEL
Iron: 142 ug/dL (ref 42–165)
Saturation Ratios: 42.8 % (ref 20.0–50.0)
Transferrin: 237 mg/dL (ref 212.0–360.0)

## 2019-11-01 LAB — FERRITIN: Ferritin: 13.7 ng/mL — ABNORMAL LOW (ref 22.0–322.0)

## 2019-11-01 LAB — GAMMA GT: GGT: 53 U/L — ABNORMAL HIGH (ref 7–51)

## 2019-11-01 NOTE — Assessment & Plan Note (Signed)
Update labs.  

## 2019-11-01 NOTE — Addendum Note (Signed)
Addended by: Ellamae Sia on: 11/01/2019 12:12 PM   Modules accepted: Orders

## 2019-11-01 NOTE — Assessment & Plan Note (Addendum)
Elevated - fractionate ALP, check GGT. Pt asxs.

## 2019-11-01 NOTE — Assessment & Plan Note (Signed)
He has restarted oral iron. Has declined GI evaluation of IDA. Update labs.

## 2019-11-01 NOTE — Assessment & Plan Note (Signed)
Chronic, deteriorated. Continue losartan, amlodipine, hctz and diltiazem.

## 2019-11-01 NOTE — Assessment & Plan Note (Addendum)
20 lb weight loss this past year but weight has stabilized over the last 6 months. No focal symptoms. Update labs, low threshold to image abdomen.

## 2019-11-01 NOTE — Assessment & Plan Note (Addendum)
Stable period on current regimen. Diarrhea has improved on lower metformin XL dose. Goal A1c <8%

## 2019-11-01 NOTE — Patient Instructions (Addendum)
Labs today.  Return in 3 onths for follow up visit.

## 2019-11-01 NOTE — Progress Notes (Signed)
This visit was conducted in person.  BP (!) 156/64 (BP Location: Right Arm, Patient Position: Sitting, Cuff Size: Normal)   Pulse 84   Temp 98.3 F (36.8 C) (Temporal)   Ht 5' 10.5" (1.791 m)   Wt 185 lb 2 oz (84 kg)   SpO2 99%   BMI 26.19 kg/m   BP Readings from Last 3 Encounters:  11/01/19 (!) 156/64  08/02/19 130/62  04/30/19 140/60    CC: DM f/u visit Subjective:    Patient ID: Marcus LankSamuel J Scarpulla Jr., male    DOB: 03/17/1941, 78 y.o.   MRN: [id number]  HPI: Marcus LankSamuel J Varano Jr. is a 78 y.o. male presenting on 11/01/2019 for Diabetes (Here for 3 mo f/u.)   DM - does not regularly check sugars. Compliant with antihyperglycemic regimen which includes: novolog 70/30 25/22u daily, metformin XR 500mg  daily, jardiance 10mg  daily. Denies low sugars or hypoglycemic symptoms. Denies paresthesias. Last diabetic eye exam states he saw 2 weeks ago. Pneumovax: 2011. Prevnar: 2015. Glucometer brand: unsure. DSME: declined. Saw podiatrist 07/2019 rec Q3 mo f/u. Lower metformin dose has helped diarrhea.  Lab Results  Component Value Date   HGBA1C 7.9 (A) 11/01/2019   Diabetic Foot Exam - Simple   No data filed     Lab Results  Component Value Date   MICROALBUR 8.2 (H) 09/01/2012     He has started taking oral iron daily.  Denies fevers/chills, abd pain, chest pain, cough or dyspnea, blood in stool or urine, nausea/vomiting, diarrhea. No dysphagia, early satiety. Denies malaise - "I feel fine".   He does not want colonoscopy.      Relevant past medical, surgical, family and social history reviewed and updated as indicated. Interim medical history since our last visit reviewed. Allergies and medications reviewed and updated. Outpatient Medications Prior to Visit  Medication Sig Dispense Refill  . amLODipine-atorvastatin (CADUET) 10-40 MG tablet TAKE 1 TABLET BY MOUTH EVERY DAY 90 tablet 2  . aspirin 325 MG EC tablet Take 325 mg by mouth daily. For stroke prevention      . brimonidine  (ALPHAGAN P) 0.15 % ophthalmic solution Place 1 drop into both eyes 3 (three) times daily.    . diclofenac sodium (VOLTAREN) 1 % GEL Apply 1 application topically 3 (three) times daily as needed. 1 Tube 3  . diltiazem (CARDIZEM CD) 360 MG 24 hr capsule TAKE 1 CAPSULE BY MOUTH EVERY DAY 90 capsule 0  . diltiazem (TIAZAC) 360 MG 24 hr capsule Take 1 capsule (360 mg total) by mouth daily. 90 capsule 1  . ferrous sulfate 325 (65 FE) MG tablet Take 1 tablet (325 mg total) by mouth every other day.  3  . glucose blood (EMBRACE BLOOD GLUCOSE TEST) test strip Check three times daily and when feeling ill E11.8 insulin dependent 100 each 12  . hydrochlorothiazide (HYDRODIURIL) 25 MG tablet TAKE 1 TABLET BY MOUTH EVERY DAY 90 tablet 3  . insulin aspart protamine- aspart (NOVOLOG MIX 70/30) (70-30) 100 UNIT/ML injection Take 25 units with breakfast and 22 units with supper. QS 1 month 20 mL 11  . JARDIANCE 10 MG TABS tablet TAKE 1 TABLET BY MOUTH EVERY DAY 90 tablet 2  . latanoprost (XALATAN) 0.005 % ophthalmic solution Place 1 drop into both eyes at bedtime.    . Latanoprostene Bunod (VYZULTA) 0.024 % SOLN Apply 1 drop to eye at bedtime.    . Loperamide HCl (IMODIUM PO) Take 1 tablet by mouth as needed.     .Marland Kitchen  losartan (COZAAR) 100 MG tablet TAKE 1 TABLET BY MOUTH EVERY DAY 90 tablet 2  . metFORMIN (GLUCOPHAGE-XR) 500 MG 24 hr tablet Take 1 tablet (500 mg total) by mouth daily with breakfast. 90 tablet 1  . potassium chloride (K-DUR) 10 MEQ tablet Take 3 tablets (30 mEq total) by mouth daily. 270 tablet 1   No facility-administered medications prior to visit.     Per HPI unless specifically indicated in ROS section below Review of Systems Objective:    BP (!) 156/64 (BP Location: Right Arm, Patient Position: Sitting, Cuff Size: Normal)   Pulse 84   Temp 98.3 F (36.8 C) (Temporal)   Ht 5' 10.5" (1.791 m)   Wt 185 lb 2 oz (84 kg)   SpO2 99%   BMI 26.19 kg/m   Wt Readings from Last 3 Encounters:    11/01/19 185 lb 2 oz (84 kg)  08/02/19 186 lb 6 oz (84.5 kg)  04/30/19 187 lb 3 oz (84.9 kg)    Physical Exam Vitals and nursing note reviewed.  Constitutional:      Appearance: Normal appearance. He is not ill-appearing.     Comments: In wheelchair due to L hemiparesis, L leg brace in place  Cardiovascular:     Rate and Rhythm: Normal rate and regular rhythm.     Pulses: Normal pulses.     Heart sounds: Normal heart sounds. No murmur.  Pulmonary:     Effort: Pulmonary effort is normal. No respiratory distress.     Breath sounds: Normal breath sounds. No wheezing, rhonchi or rales.  Abdominal:     General: Abdomen is flat. There is no distension.     Palpations: Abdomen is soft. There is no mass.     Tenderness: There is no abdominal tenderness. There is no guarding or rebound.     Hernia: No hernia is present.  Musculoskeletal:     Right lower leg: Edema present.     Left lower leg: Edema present.     Comments: L leg brace in place  Neurological:     Mental Status: He is alert.  Psychiatric:        Mood and Affect: Mood normal.        Behavior: Behavior normal.       Results for orders placed or performed in visit on 11/01/19  POCT glycosylated hemoglobin (Hb A1C)  Result Value Ref Range   Hemoglobin A1C 7.9 (A) 4.0 - 5.6 %   HbA1c POC (<> result, manual entry)     HbA1c, POC (prediabetic range)     HbA1c, POC (controlled diabetic range)     Lab Results  Component Value Date   PSA 3.03 04/30/2019   PSA 2.33 04/21/2017   PSA 1.99 02/20/2015     Assessment & Plan:  This visit occurred during the SARS-CoV-2 public health emergency.  Safety protocols were in place, including screening questions prior to the visit, additional usage of staff PPE, and extensive cleaning of exam room while observing appropriate contact time as indicated for disinfecting solutions.   Problem List Items Addressed This Visit    Weight loss    20 lb weight loss this past year but weight  has stabilized over the last 6 months. No focal symptoms. Update labs, low threshold to image abdomen.       Type 2 diabetes mellitus with vascular disease ([facility])   Renal insufficiency    Update labs.       Iron deficiency anemia  He has restarted oral iron. Has declined GI evaluation of IDA. Update labs.       Relevant Orders   CBC with Differential   Ferritin   IBC panel   Hemiplegia, late effect of cerebrovascular disease (HCC)   Essential hypertension    Chronic, deteriorated. Continue losartan, amlodipine, hctz and diltiazem.       Elevated alkaline phosphatase level    Elevated - fractionate ALP, check GGT. Pt asxs.       Relevant Orders   Comprehensive metabolic panel   Gamma GT   Alkaline Phosphatase, Isoenzymes   Diabetes mellitus type 2, uncontrolled, with complications (HCC) - Primary    Stable period on current regimen. Diarrhea has improved on lower metformin XL dose. Goal A1c <8%      Relevant Orders   POCT glycosylated hemoglobin (Hb A1C) (Completed)       No orders of the defined types were placed in this encounter.  Orders Placed This Encounter  Procedures  . CBC with Differential  . Comprehensive metabolic panel  . Gamma GT  . Alkaline Phosphatase, Isoenzymes  . Ferritin  . IBC panel  . POCT glycosylated hemoglobin (Hb A1C)   Patient Instructions  Labs today.  Return in 3 onths for follow up visit.    Follow up plan: Return in about 3 months (around 01/30/2020) for follow up visit.  [name], MD

## 2019-11-08 ENCOUNTER — Other Ambulatory Visit: Payer: Self-pay | Admitting: Family Medicine

## 2019-11-08 DIAGNOSIS — R634 Abnormal weight loss: Secondary | ICD-10-CM

## 2019-11-08 DIAGNOSIS — R748 Abnormal levels of other serum enzymes: Secondary | ICD-10-CM

## 2019-11-09 ENCOUNTER — Telehealth: Payer: Self-pay | Admitting: Family Medicine

## 2019-11-09 LAB — ALKALINE PHOSPHATASE ISOENZYMES
Alkaline phosphatase (APISO): 238 U/L — ABNORMAL HIGH (ref 35–144)
Bone Isoenzymes: 27 % — ABNORMAL LOW (ref 28–66)
Intestinal Isoenzymes: 0 % — ABNORMAL LOW (ref 1–24)
Liver Isoenzymes: 73 % — ABNORMAL HIGH (ref 25–69)

## 2019-11-09 NOTE — Telephone Encounter (Signed)
Called patient and read Dr Gutierrez's recommendations for having an [place] or CT Scan. Patient declines to have either imaging study and says he feels fine and still does not twant  to have any study done at this time.

## 2019-11-10 NOTE — Telephone Encounter (Signed)
noted 

## 2019-11-15 ENCOUNTER — Encounter: Payer: Self-pay | Admitting: Podiatry

## 2019-11-15 ENCOUNTER — Ambulatory Visit (INDEPENDENT_AMBULATORY_CARE_PROVIDER_SITE_OTHER): Payer: Medicare Other | Admitting: Podiatry

## 2019-11-15 ENCOUNTER — Other Ambulatory Visit: Payer: Self-pay

## 2019-11-15 DIAGNOSIS — B351 Tinea unguium: Secondary | ICD-10-CM

## 2019-11-15 DIAGNOSIS — M79675 Pain in left toe(s): Secondary | ICD-10-CM | POA: Diagnosis not present

## 2019-11-15 DIAGNOSIS — E1159 Type 2 diabetes mellitus with other circulatory complications: Secondary | ICD-10-CM

## 2019-11-15 DIAGNOSIS — M79674 Pain in right toe(s): Secondary | ICD-10-CM

## 2019-11-15 NOTE — Progress Notes (Signed)
Complaint:  Visit Type: Patient returns to my office for continued preventative foot care services. Complaint: Patient states" my nails have grown long and thick and become painful to walk and wear shoes" Patient has been diagnosed with DM with vascular disease. The patient presents for preventative foot care services. No changes to ROS.    Patient has history of CVA.  Podiatric Exam: Vascular: dorsalis pedis are weakly palpable.  Posterior tibial pulses are absent with severe ankle swelling. Capillary return is immediate. Temperature gradient is WNL. Skin turgor WNL  Sensorium: deferred  Nail Exam: Pt has thick disfigured discolored nails with subungual debris noted bilateral entire nail hallux through fifth toenails Ulcer Exam: There is no evidence of ulcer or pre-ulcerative changes or infection. Orthopedic Exam: Muscle tone and strength are WNL. No limitations in general ROM. No crepitus or effusions noted. Foot type and digits show no abnormalities. Bony prominences are unremarkable. Skin: No Porokeratosis. No infection or ulcers  Diagnosis:  Onychomycosis, , Pain in right toe, pain in left toes  Treatment & Plan Procedures and Treatment: Consent by patient was obtained for treatment procedures.   Debridement of mycotic and hypertrophic toenails, 1 through 5 bilateral and clearing of subungual debris. No ulceration, no infection noted.  Return Visit-Office Procedure: Patient instructed to return to the office for a follow up visit 3 months for continued evaluation and treatment.    Gardiner Barefoot DPM

## 2019-12-16 ENCOUNTER — Other Ambulatory Visit: Payer: Self-pay | Admitting: Family Medicine

## 2020-01-31 ENCOUNTER — Ambulatory Visit (INDEPENDENT_AMBULATORY_CARE_PROVIDER_SITE_OTHER): Payer: Medicare Other | Admitting: Family Medicine

## 2020-01-31 ENCOUNTER — Encounter: Payer: Self-pay | Admitting: Family Medicine

## 2020-01-31 ENCOUNTER — Other Ambulatory Visit: Payer: Self-pay

## 2020-01-31 VITALS — BP 134/62 | HR 95 | Temp 98.4°F | Ht 70.5 in | Wt 183.5 lb

## 2020-01-31 DIAGNOSIS — I69954 Hemiplegia and hemiparesis following unspecified cerebrovascular disease affecting left non-dominant side: Secondary | ICD-10-CM

## 2020-01-31 DIAGNOSIS — E118 Type 2 diabetes mellitus with unspecified complications: Secondary | ICD-10-CM | POA: Diagnosis not present

## 2020-01-31 DIAGNOSIS — R634 Abnormal weight loss: Secondary | ICD-10-CM | POA: Diagnosis not present

## 2020-01-31 DIAGNOSIS — Z794 Long term (current) use of insulin: Secondary | ICD-10-CM

## 2020-01-31 DIAGNOSIS — I10 Essential (primary) hypertension: Secondary | ICD-10-CM | POA: Diagnosis not present

## 2020-01-31 DIAGNOSIS — R748 Abnormal levels of other serum enzymes: Secondary | ICD-10-CM

## 2020-01-31 LAB — POCT GLYCOSYLATED HEMOGLOBIN (HGB A1C): Hemoglobin A1C: 7.8 % — AB (ref 4.0–5.6)

## 2020-01-31 NOTE — Assessment & Plan Note (Signed)
Chronic, stable. Continue current 4 drug regimen.  

## 2020-01-31 NOTE — Assessment & Plan Note (Signed)
Fractionated ALP with increased liver levels. Has declined all further workup .

## 2020-01-31 NOTE — Progress Notes (Signed)
This visit was conducted in person.  BP 134/62 (BP Location: Right Arm, Patient Position: Sitting, Cuff Size: Normal)   Pulse 95   Temp 98.4 F (36.9 C) (Temporal)   Ht 5' 10.5" (1.791 m)   Wt 183 lb 8 oz (83.2 kg)   SpO2 98%   BMI 25.96 kg/m   BP Readings from Last 3 Encounters:  01/31/20 134/62  11/01/19 (!) 156/64  08/02/19 130/62   CC: DM f/u visit Subjective:    Patient ID: Marcus Lank., male    DOB: 07-Jan-1941, 79 y.o.   MRN: [id number]  HPI: Marcus Advincula. is a 79 y.o. male presenting on 01/31/2020 for Follow-up (Here for 3 mo f/u.)   See recent office notes and lab results and phone note - he has declined all further evaluation of hemeccult positive stools with weight loss and elevated ALP. Declined CT, [place], previously declined GI evaluation and/or colonoscopy.   DM - does regularly check sugars 150-170 after breakfast. Compliant with antihyperglycemic regimen which includes: novolog 70/30 25u with breakfast, 22 with supper, jardiance 10mg , metformin XR 500mg  daily. Denies low sugars or hypoglycemic symptoms. Denies paresthesias. Last diabetic eye exam DUE. Pneumovax: 2011. Prevnar: 2015. Glucometer brand: Embrace. DSME: declined.  Lab Results  Component Value Date   HGBA1C 7.8 (A) 01/31/2020   Diabetic Foot Exam - Simple   No data filed     Lab Results  Component Value Date   MICROALBUR 8.2 (H) 09/01/2012        Relevant past medical, surgical, family and social history reviewed and updated as indicated. Interim medical history since our last visit reviewed. Allergies and medications reviewed and updated. Outpatient Medications Prior to Visit  Medication Sig Dispense Refill  . ALPHAGAN P 0.1 % SOLN     . amLODipine-atorvastatin (CADUET) 10-40 MG tablet TAKE 1 TABLET BY MOUTH EVERY DAY 90 tablet 2  . aspirin 325 MG EC tablet Take 325 mg by mouth daily. For stroke prevention      . brimonidine (ALPHAGAN P) 0.15 % ophthalmic solution Place 1 drop into  both eyes 3 (three) times daily.    . diclofenac sodium (VOLTAREN) 1 % GEL Apply 1 application topically 3 (three) times daily as needed. 1 Tube 3  . diltiazem (CARDIZEM CD) 360 MG 24 hr capsule TAKE 1 CAPSULE BY MOUTH EVERY DAY 90 capsule 1  . ferrous sulfate 325 (65 FE) MG tablet Take 1 tablet (325 mg total) by mouth every other day.  3  . glucose blood (EMBRACE BLOOD GLUCOSE TEST) test strip Check three times daily and when feeling ill E11.8 insulin dependent 100 each 12  . hydrochlorothiazide (HYDRODIURIL) 25 MG tablet TAKE 1 TABLET BY MOUTH EVERY DAY 90 tablet 3  . insulin aspart protamine- aspart (NOVOLOG MIX 70/30) (70-30) 100 UNIT/ML injection Take 25 units with breakfast and 22 units with supper. QS 1 month 20 mL 11  . JARDIANCE 10 MG TABS tablet TAKE 1 TABLET BY MOUTH EVERY DAY 90 tablet 2  . latanoprost (XALATAN) 0.005 % ophthalmic solution Place 1 drop into both eyes at bedtime.    . Latanoprostene Bunod (VYZULTA) 0.024 % SOLN Apply 1 drop to eye at bedtime.    . Loperamide HCl (IMODIUM PO) Take 1 tablet by mouth as needed.     02/02/2020 losartan (COZAAR) 100 MG tablet TAKE 1 TABLET BY MOUTH EVERY DAY 90 tablet 2  . metFORMIN (GLUCOPHAGE-XR) 500 MG 24 hr tablet Take 1 tablet (500  mg total) by mouth daily with breakfast. 90 tablet 1  . potassium chloride (K-DUR) 10 MEQ tablet Take 3 tablets (30 mEq total) by mouth daily. 270 tablet 1  . diltiazem (TIAZAC) 360 MG 24 hr capsule Take 1 capsule (360 mg total) by mouth daily. 90 capsule 1   No facility-administered medications prior to visit.     Per HPI unless specifically indicated in ROS section below Review of Systems Objective:    BP 134/62 (BP Location: Right Arm, Patient Position: Sitting, Cuff Size: Normal)   Pulse 95   Temp 98.4 F (36.9 C) (Temporal)   Ht 5' 10.5" (1.791 m)   Wt 183 lb 8 oz (83.2 kg)   SpO2 98%   BMI 25.96 kg/m   Wt Readings from Last 3 Encounters:  01/31/20 183 lb 8 oz (83.2 kg)  11/01/19 185 lb 2 oz (84  kg)  08/02/19 186 lb 6 oz (84.5 kg)    Physical Exam Vitals and nursing note reviewed.  Constitutional:      Appearance: Normal appearance. He is not ill-appearing.     Comments: Sitting in wheelchair  Cardiovascular:     Rate and Rhythm: Normal rate and regular rhythm.     Pulses: Normal pulses.     Heart sounds: Normal heart sounds. No murmur.  Pulmonary:     Effort: Pulmonary effort is normal. No respiratory distress.     Breath sounds: Normal breath sounds. No wheezing, rhonchi or rales.  Skin:    General: Skin is warm and dry.     Findings: No rash.  Neurological:     Mental Status: He is alert.     Comments:  Chronic L hemiparesis.  L arm in sling L foot-drop ankle brace in place  Psychiatric:        Mood and Affect: Mood normal.       Results for orders placed or performed in visit on 01/31/20  POCT glycosylated hemoglobin (Hb A1C)  Result Value Ref Range   Hemoglobin A1C 7.8 (A) 4.0 - 5.6 %   HbA1c POC (<> result, manual entry)     HbA1c, POC (prediabetic range)     HbA1c, POC (controlled diabetic range)     Assessment & Plan:  This visit occurred during the SARS-CoV-2 public health emergency.  Safety protocols were in place, including screening questions prior to the visit, additional usage of staff PPE, and extensive cleaning of exam room while observing appropriate contact time as indicated for disinfecting solutions.   Problem List Items Addressed This Visit    Weight loss    In setting of weight loss, iron deficiency anemia, and now increasing ALP trend - reviewed concerns for hidden malignancy. He has declined GI evaluation, continues to decline imaging at this time. Reviewed concern for progression of cancer as a possible diagnosis that we are not treating. Tried to answer questions regarding the process for getting an abd [place] or CT if he changes his mind. He is aware to let me know if he agrees to proceed with further eval.       Hemiplegia, late effect of  cerebrovascular disease (HCC)    Chronic.       Essential hypertension    Chronic, stable. Continue current 4 drug regimen.      Elevated alkaline phosphatase level    Fractionated ALP with increased liver levels. Has declined all further workup .       Controlled diabetes mellitus type 2 with complications (HCC) -  Primary    Chronic, stable - goal A1c likely <8% given co-morbidities. Continue current regimen.       Relevant Orders   POCT glycosylated hemoglobin (Hb A1C) (Completed)       No orders of the defined types were placed in this encounter.  Orders Placed This Encounter  Procedures  . POCT glycosylated hemoglobin (Hb A1C)    Patient Instructions  Sign up for covid shot - drive through option - at this website ([place] location): [email or web address] Keep eye appointment.  Continue current medicines. Let me know if you change your mind for imaging of liver and abdomen or GI evaluation. We would send you to [place] center off of [address].  Return in 3 months for wellness visit and follow up.    Follow up plan: Return in about 3 months (around 05/02/2020), or if symptoms worsen or fail to improve, for medicare wellness visit, follow up visit.  Ria Bush, MD

## 2020-01-31 NOTE — Assessment & Plan Note (Signed)
Chronic. 

## 2020-01-31 NOTE — Assessment & Plan Note (Signed)
Chronic, stable - goal A1c likely <8% given co-morbidities. Continue current regimen.

## 2020-01-31 NOTE — Patient Instructions (Addendum)
Sign up for covid shot - drive through option - at this website ([place] location): [email or web address] Keep eye appointment.  Continue current medicines. Let me know if you change your mind for imaging of liver and abdomen or GI evaluation. We would send you to [facility] off of [address].  Return in 3 months for wellness visit and follow up.

## 2020-01-31 NOTE — Assessment & Plan Note (Signed)
In setting of weight loss, iron deficiency anemia, and now increasing ALP trend - reviewed concerns for hidden malignancy. He has declined GI evaluation, continues to decline imaging at this time. Reviewed concern for progression of cancer as a possible diagnosis that we are not treating. Tried to answer questions regarding the process for getting an abd [place] or CT if he changes his mind. He is aware to let me know if he agrees to proceed with further eval.

## 2020-02-14 ENCOUNTER — Ambulatory Visit (INDEPENDENT_AMBULATORY_CARE_PROVIDER_SITE_OTHER): Payer: Medicare Other | Admitting: Podiatry

## 2020-02-14 ENCOUNTER — Other Ambulatory Visit: Payer: Self-pay

## 2020-02-14 ENCOUNTER — Encounter: Payer: Self-pay | Admitting: Podiatry

## 2020-02-14 VITALS — Temp 98.3°F

## 2020-02-14 DIAGNOSIS — M79675 Pain in left toe(s): Secondary | ICD-10-CM | POA: Diagnosis not present

## 2020-02-14 DIAGNOSIS — E1159 Type 2 diabetes mellitus with other circulatory complications: Secondary | ICD-10-CM | POA: Diagnosis not present

## 2020-02-14 DIAGNOSIS — B351 Tinea unguium: Secondary | ICD-10-CM

## 2020-02-14 DIAGNOSIS — M79674 Pain in right toe(s): Secondary | ICD-10-CM | POA: Diagnosis not present

## 2020-02-14 NOTE — Progress Notes (Signed)
This patient returns to my office for at risk foot care.  This patient requires this care by a professional since this patient will be at risk due to having diabetes and renal insufficiency.  Patient is accompanied by a caretaker today.  This patient is unable to cut nails himself since the patient cannot reach his nails.  These nails are painful walking and wearing shoes.  This patient presents for at risk foot care today.  General Appearance  Alert, conversant and in no acute stress.  Vascular  Dorsalis pedis are weakly  palpable  bilaterally. Posterior tibial pulses are absent  B/L. Capillary return is within normal limits  bilaterally. Temperature is within normal limits  bilaterally.  Neurologic  Deferred.  Nails Thick disfigured discolored nails with subungual debris  from hallux to fifth toes bilaterally. No evidence of bacterial infection or drainage bilaterally.  Orthopedic  No limitations of motion  feet .  No crepitus or effusions noted.  No bony pathology or digital deformities noted.  Skin  normotropic skin with no porokeratosis noted bilaterally.  No signs of infections or ulcers noted.     Onychomycosis  Pain in right toes  Pain in left toes  Consent was obtained for treatment procedures.   Mechanical debridement of nails 1-5  bilaterally performed with a nail nipper.  Filed with dremel without incident.    Return office visit    3 months                 Told patient to return for periodic foot care and evaluation due to potential at risk complications.   Helane Gunther DPM

## 2020-02-16 DIAGNOSIS — H401132 Primary open-angle glaucoma, bilateral, moderate stage: Secondary | ICD-10-CM | POA: Diagnosis not present

## 2020-02-16 DIAGNOSIS — H26493 Other secondary cataract, bilateral: Secondary | ICD-10-CM | POA: Diagnosis not present

## 2020-02-16 DIAGNOSIS — H47233 Glaucomatous optic atrophy, bilateral: Secondary | ICD-10-CM | POA: Diagnosis not present

## 2020-02-16 DIAGNOSIS — H43813 Vitreous degeneration, bilateral: Secondary | ICD-10-CM | POA: Diagnosis not present

## 2020-03-08 DIAGNOSIS — H401132 Primary open-angle glaucoma, bilateral, moderate stage: Secondary | ICD-10-CM | POA: Diagnosis not present

## 2020-03-11 ENCOUNTER — Other Ambulatory Visit: Payer: Self-pay | Admitting: Family Medicine

## 2020-03-23 ENCOUNTER — Other Ambulatory Visit: Payer: Self-pay | Admitting: Family Medicine

## 2020-05-01 ENCOUNTER — Ambulatory Visit (INDEPENDENT_AMBULATORY_CARE_PROVIDER_SITE_OTHER): Payer: Medicare Other | Admitting: Family Medicine

## 2020-05-01 ENCOUNTER — Other Ambulatory Visit: Payer: Self-pay

## 2020-05-01 ENCOUNTER — Encounter: Payer: Self-pay | Admitting: Family Medicine

## 2020-05-01 VITALS — BP 128/60 | HR 88 | Temp 97.0°F | Ht 70.5 in | Wt 182.6 lb

## 2020-05-01 DIAGNOSIS — R748 Abnormal levels of other serum enzymes: Secondary | ICD-10-CM

## 2020-05-01 DIAGNOSIS — I10 Essential (primary) hypertension: Secondary | ICD-10-CM

## 2020-05-01 DIAGNOSIS — I69954 Hemiplegia and hemiparesis following unspecified cerebrovascular disease affecting left non-dominant side: Secondary | ICD-10-CM

## 2020-05-01 DIAGNOSIS — N289 Disorder of kidney and ureter, unspecified: Secondary | ICD-10-CM | POA: Diagnosis not present

## 2020-05-01 DIAGNOSIS — R634 Abnormal weight loss: Secondary | ICD-10-CM | POA: Diagnosis not present

## 2020-05-01 DIAGNOSIS — D509 Iron deficiency anemia, unspecified: Secondary | ICD-10-CM

## 2020-05-01 DIAGNOSIS — Z1159 Encounter for screening for other viral diseases: Secondary | ICD-10-CM

## 2020-05-01 DIAGNOSIS — Z0000 Encounter for general adult medical examination without abnormal findings: Secondary | ICD-10-CM | POA: Diagnosis not present

## 2020-05-01 DIAGNOSIS — H401132 Primary open-angle glaucoma, bilateral, moderate stage: Secondary | ICD-10-CM

## 2020-05-01 DIAGNOSIS — N400 Enlarged prostate without lower urinary tract symptoms: Secondary | ICD-10-CM | POA: Diagnosis not present

## 2020-05-01 DIAGNOSIS — E1159 Type 2 diabetes mellitus with other circulatory complications: Secondary | ICD-10-CM | POA: Diagnosis not present

## 2020-05-01 DIAGNOSIS — E785 Hyperlipidemia, unspecified: Secondary | ICD-10-CM

## 2020-05-01 DIAGNOSIS — E1169 Type 2 diabetes mellitus with other specified complication: Secondary | ICD-10-CM

## 2020-05-01 DIAGNOSIS — E118 Type 2 diabetes mellitus with unspecified complications: Secondary | ICD-10-CM

## 2020-05-01 DIAGNOSIS — Z7189 Other specified counseling: Secondary | ICD-10-CM

## 2020-05-01 DIAGNOSIS — Z794 Long term (current) use of insulin: Secondary | ICD-10-CM

## 2020-05-01 DIAGNOSIS — E1136 Type 2 diabetes mellitus with diabetic cataract: Secondary | ICD-10-CM

## 2020-05-01 LAB — COMPREHENSIVE METABOLIC PANEL
ALT: 26 U/L (ref 0–53)
AST: 32 U/L (ref 0–37)
Albumin: 3.9 g/dL (ref 3.5–5.2)
Alkaline Phosphatase: 196 U/L — ABNORMAL HIGH (ref 39–117)
BUN: 21 mg/dL (ref 6–23)
CO2: 28 mEq/L (ref 19–32)
Calcium: 9.4 mg/dL (ref 8.4–10.5)
Chloride: 97 mEq/L (ref 96–112)
Creatinine, Ser: 1.31 mg/dL (ref 0.40–1.50)
GFR: 63.99 mL/min (ref >60.00)
Glucose, Bld: 297 mg/dL — ABNORMAL HIGH (ref 70–99)
Potassium: 3.9 mEq/L (ref 3.5–5.1)
Sodium: 134 mEq/L — ABNORMAL LOW (ref 135–145)
Total Bilirubin: 0.6 mg/dL (ref 0.2–1.2)
Total Protein: 8.1 g/dL (ref 6.0–8.3)

## 2020-05-01 LAB — LIPID PANEL
Cholesterol: 104 mg/dL (ref 0–200)
HDL: 51.8 mg/dL (ref >39.00)
LDL Cholesterol: 43 mg/dL (ref 0–99)
NonHDL: 52.16
Total CHOL/HDL Ratio: 2
Triglycerides: 48 mg/dL (ref 0.0–149.0)
VLDL: 9.6 mg/dL (ref 0.0–40.0)

## 2020-05-01 LAB — CBC WITH DIFFERENTIAL/PLATELET
Basophils Absolute: 0.1 10*3/uL (ref 0.0–0.1)
Basophils Relative: 1.2 % (ref 0.0–3.0)
Eosinophils Absolute: 0.1 10*3/uL (ref 0.0–0.7)
Eosinophils Relative: 1.6 % (ref 0.0–5.0)
HCT: 41.7 % (ref 39.0–52.0)
Hemoglobin: 13.7 g/dL (ref 13.0–17.0)
Lymphocytes Relative: 17.5 % (ref 12.0–46.0)
Lymphs Abs: 1.4 10*3/uL (ref 0.7–4.0)
MCHC: 32.8 g/dL (ref 30.0–36.0)
MCV: 76.1 fl — ABNORMAL LOW (ref 78.0–100.0)
Monocytes Absolute: 0.7 10*3/uL (ref 0.1–1.0)
Monocytes Relative: 8.9 % (ref 3.0–12.0)
Neutro Abs: 5.7 10*3/uL (ref 1.4–7.7)
Neutrophils Relative %: 70.8 % (ref 43.0–77.0)
Platelets: 278 10*3/uL (ref 150.0–400.0)
RBC: 5.48 Mil/uL (ref 4.22–5.81)
RDW: 21.2 % — ABNORMAL HIGH (ref 11.5–15.5)
WBC: 8 10*3/uL (ref 4.0–10.5)

## 2020-05-01 LAB — GAMMA GT: GGT: 55 U/L — ABNORMAL HIGH (ref 7–51)

## 2020-05-01 LAB — HEMOGLOBIN A1C: Hgb A1c MFr Bld: 8.2 % — ABNORMAL HIGH (ref 4.6–6.5)

## 2020-05-01 LAB — FERRITIN: Ferritin: 13.5 ng/mL — ABNORMAL LOW (ref 22.0–322.0)

## 2020-05-01 LAB — PSA: PSA: 3.22 ng/mL (ref 0.10–4.00)

## 2020-05-01 NOTE — Assessment & Plan Note (Signed)
Chronic, stable. Continue current regimen. 

## 2020-05-01 NOTE — Progress Notes (Signed)
This visit was conducted in person.  BP 128/60   Pulse 88   Temp (!) 97 F (36.1 C) (Temporal)   Ht 5' 10.5" (1.791 m)   Wt 182 lb 9.6 oz (82.8 kg)   SpO2 97%   BMI 25.83 kg/m    CC: AMW Subjective:    Patient ID: Marcus Banter., male    DOB: May 08, 1941, 79 y.o.   MRN: [id number]  HPI: Marcus Krygier. is a 79 y.o. male presenting on 05/01/2020 for Annual Exam   Did not see health advisor this year.   No exam data present    Office Visit from 05/01/2020 in [place] at [facility] Total Score 0      Fall Risk  05/01/2020 05/01/2020 04/23/2019 04/20/2018 04/25/2017  Falls in the past year? 0 0 0 Yes Yes  Number falls in past yr: 0 0 - 2 or more 2 or more  Injury with Fall? 0 0 - Yes No  Risk Factor Category  - - - High Fall Risk High Fall Risk  Risk for fall due to : - Impaired balance/gait;Impaired mobility - - Impaired balance/gait;Impaired mobility  Follow up - Falls evaluation completed - - Falls prevention discussed    Slow weight loss in setting of elevated ALP, IDA, and hemoccult positive stools - has declined all further evaluation of this including imaging and colonoscopy, aware this could portend cancer diagnosis.  Sees podiatry (Dr Prudence Davidson) for diabetic foot exam q3 mo.   Preventative: Colon cancer screening - never completed colonoscopy. Declines iFOB as well - difficulty completing 2/2hemiparesis.  Prostate screen - brother in law died from prostate cancer. Will check PSA, consider stopping screening. Fluyearly  Td 2010  Pneumovax 2011, prevnar 05/2014  zostavax- 08/2012  Shingrix - discussed - to check with insurance  COVID vaccine - completed J&J vaccine 02/2020  Advanced directives - would want sister, Marcus Downs to be HCPOA. Advanced packetpreviouslyprovided. Needs assistance filling this out.  Seat belt use discussed.  Sunscreen use discussed. No changing moles on skin.  Ex smoker - quit 1997.  Alcohol - none  Dentist doesn't see - has  full dentures Eye exam yearly  Bowel - no constipation  Bladder - incontinence, regularly wears depens during day up to 2 a day. No trouble at night   Caffeine: neg Lives alone. Son's brother in law helps with medicines. Family nearby - 2 sisters and nieces/nephews in area. Has nurse aide who helps at home 3d/wk.  Activity:no regular exercise Diet: good water, fruits/vegetables daily     Relevant past medical, surgical, family and social history reviewed and updated as indicated. Interim medical history since our last visit reviewed. Allergies and medications reviewed and updated. Outpatient Medications Prior to Visit  Medication Sig Dispense Refill  . ALPHAGAN P 0.1 % SOLN     . amLODipine-atorvastatin (CADUET) 10-40 MG tablet TAKE 1 TABLET BY MOUTH EVERY DAY 90 tablet 2  . aspirin 325 MG EC tablet Take 325 mg by mouth daily. For stroke prevention      . brimonidine (ALPHAGAN P) 0.15 % ophthalmic solution Place 1 drop into both eyes 3 (three) times daily.    . diclofenac sodium (VOLTAREN) 1 % GEL Apply 1 application topically 3 (three) times daily as needed. 1 Tube 3  . diltiazem (CARDIZEM CD) 360 MG 24 hr capsule TAKE 1 CAPSULE BY MOUTH EVERY DAY 90 capsule 1  . ferrous sulfate 325 (65 FE) MG tablet Take  1 tablet (325 mg total) by mouth every other day.  3  . glucose blood (EMBRACE BLOOD GLUCOSE TEST) test strip Check three times daily and when feeling ill E11.8 insulin dependent 100 each 12  . hydrochlorothiazide (HYDRODIURIL) 25 MG tablet TAKE 1 TABLET BY MOUTH EVERY DAY 90 tablet 3  . insulin aspart protamine- aspart (NOVOLOG MIX 70/30) (70-30) 100 UNIT/ML injection Take 25 units with breakfast and 22 units with supper. QS 1 month 20 mL 11  . JARDIANCE 10 MG TABS tablet TAKE 1 TABLET BY MOUTH EVERY DAY 90 tablet 0  . latanoprost (XALATAN) 0.005 % ophthalmic solution Place 1 drop into both eyes at bedtime.    . Latanoprostene Bunod (VYZULTA) 0.024 % SOLN Apply 1 drop to eye at  bedtime.    . Loperamide HCl (IMODIUM PO) Take 1 tablet by mouth as needed.     Marland Kitchen losartan (COZAAR) 100 MG tablet TAKE 1 TABLET BY MOUTH EVERY DAY 90 tablet 0  . metFORMIN (GLUCOPHAGE-XR) 500 MG 24 hr tablet TAKE 1 TABLET BY MOUTH EVERY DAY WITH BREAKFAST 90 tablet 0  . potassium chloride (KLOR-CON) 10 MEQ tablet TAKE 3 TABLETS (30 MEQ TOTAL) BY MOUTH DAILY. 270 tablet 0   No facility-administered medications prior to visit.     Per HPI unless specifically indicated in ROS section below Review of Systems Objective:  BP 128/60   Pulse 88   Temp (!) 97 F (36.1 C) (Temporal)   Ht 5' 10.5" (1.791 m)   Wt 182 lb 9.6 oz (82.8 kg)   SpO2 97%   BMI 25.83 kg/m   Wt Readings from Last 3 Encounters:  05/01/20 182 lb 9.6 oz (82.8 kg)  01/31/20 183 lb 8 oz (83.2 kg)  11/01/19 185 lb 2 oz (84 kg)      Physical Exam Vitals and nursing note reviewed.  Constitutional:      General: He is not in acute distress.    Appearance: Normal appearance. He is well-developed. He is not ill-appearing.     Comments: In wheelchair  HENT:     Head: Normocephalic and atraumatic.     Right Ear: Hearing, tympanic membrane, ear canal and external ear normal.     Left Ear: Hearing, tympanic membrane, ear canal and external ear normal.  Eyes:     General: No scleral icterus.    Extraocular Movements: Extraocular movements intact.     Conjunctiva/sclera: Conjunctivae normal.     Pupils: Pupils are equal, round, and reactive to light.  Cardiovascular:     Rate and Rhythm: Normal rate and regular rhythm.     Pulses:          Radial pulses are 2+ on the right side and 2+ on the left side.     Heart sounds: Normal heart sounds. No murmur heard.   Pulmonary:     Effort: Pulmonary effort is normal. No respiratory distress.     Breath sounds: Normal breath sounds. No wheezing or rales.  Abdominal:     General: Bowel sounds are normal. There is no distension.     Palpations: Abdomen is soft. There is no mass.      Tenderness: There is no abdominal tenderness. There is no guarding or rebound.  Musculoskeletal:        General: Normal range of motion.     Cervical back: Normal range of motion and neck supple.     Right lower leg: No edema.     Left lower leg:  No edema.  Lymphadenopathy:     Cervical: No cervical adenopathy.  Skin:    General: Skin is warm and dry.     Findings: No rash.  Neurological:     General: No focal deficit present.     Mental Status: He is alert and oriented to person, place, and time.     Comments:  CN grossly intact, station and gait intact Recall 3/3 Calculation 5/5 [id number]  Psychiatric:        Mood and Affect: Mood normal.        Behavior: Behavior normal.        Thought Content: Thought content normal.        Judgment: Judgment normal.       Results for orders placed or performed in visit on 05/01/20  Comprehensive metabolic panel  Result Value Ref Range   Sodium 134 (L) 135 - 145 mEq/L   Potassium 3.9 3.5 - 5.1 mEq/L   Chloride 97 96 - 112 mEq/L   CO2 28 19 - 32 mEq/L   Glucose, Bld 297 (H) 70 - 99 mg/dL   BUN 21 6 - 23 mg/dL   Creatinine, Ser 5.361.31 0.40 - 1.50 mg/dL   Total Bilirubin 0.6 0.2 - 1.2 mg/dL   Alkaline Phosphatase 196 (H) 39 - 117 U/L   AST 32 0 - 37 U/L   ALT 26 0 - 53 U/L   Total Protein 8.1 6.0 - 8.3 g/dL   Albumin 3.9 3.5 - 5.2 g/dL   GFR 64.4063.99 >34.74>60.00 mL/min   Calcium 9.4 8.4 - 10.5 mg/dL  Lipid panel  Result Value Ref Range   Cholesterol 104 0 - 200 mg/dL   Triglycerides 25.948.0 0 - 149 mg/dL   HDL 56.3851.80 >75.64>39.00 mg/dL   VLDL 9.6 0.0 - 33.240.0 mg/dL   LDL Cholesterol 43 0 - 99 mg/dL   Total CHOL/HDL Ratio 2    NonHDL 52.16   Hemoglobin A1c  Result Value Ref Range   Hgb A1c MFr Bld 8.2 (H) 4.6 - 6.5 %  CBC with Differential/Platelet  Result Value Ref Range   WBC 8.0 4.0 - 10.5 K/uL   RBC 5.48 4.22 - 5.81 Mil/uL   Hemoglobin 13.7 13.0 - 17.0 g/dL   HCT 95.141.7 39 - 52 %   MCV 76.1 (L) 78.0 - 100.0 fl   MCHC 32.8 30.0 - 36.0  g/dL   RDW 88.421.2 (H) 16.611.5 - 06.315.5 %   Platelets 278.0 150 - 400 K/uL   Neutrophils Relative % 70.8 43 - 77 %   Lymphocytes Relative 17.5 12 - 46 %   Monocytes Relative 8.9 3 - 12 %   Eosinophils Relative 1.6 0 - 5 %   Basophils Relative 1.2 0 - 3 %   Neutro Abs 5.7 1.4 - 7.7 K/uL   Lymphs Abs 1.4 0.7 - 4.0 K/uL   Monocytes Absolute 0.7 0 - 1 K/uL   Eosinophils Absolute 0.1 0 - 0 K/uL   Basophils Absolute 0.1 0 - 0 K/uL  Gamma GT  Result Value Ref Range   GGT 55 (H) 7 - 51 U/L  Ferritin  Result Value Ref Range   Ferritin 13.5 (L) 22.0 - 322.0 ng/mL  PSA  Result Value Ref Range   PSA 3.22 0.10 - 4.00 ng/mL   Assessment & Plan:  This visit occurred during the SARS-CoV-2 public health emergency.  Safety protocols were in place, including screening questions prior to the visit, additional usage of staff PPE, and  extensive cleaning of exam room while observing appropriate contact time as indicated for disinfecting solutions.   Problem List Items Addressed This Visit    Weight loss    Weight loss stabilizing - continue to monitor. Has declined further evaluation in the past.       Relevant Orders   Gamma GT (Completed)   PSA (Completed)   Type 2 diabetes mellitus with vascular disease (HCC)   Relevant Orders   Hemoglobin A1c (Completed)   Renal insufficiency    Update labs. Continue losartan.       Primary open angle glaucoma    Sees ophthalmology - may be due for f/u.       Medicare annual wellness visit, subsequent - Primary    I have personally reviewed the Medicare Annual Wellness questionnaire and have noted 1. The patient's medical and social history 2. Their use of alcohol, tobacco or illicit drugs 3. Their current medications and supplements 4. The patient's functional ability including ADL's, fall risks, home safety risks and hearing or visual impairment. Cognitive function has been assessed and addressed as indicated.  5. Diet and physical activity 6. Evidence for  depression or mood disorders The patients weight, height, BMI have been recorded in the chart. I have made referrals, counseling and provided education to the patient based on review of the above and I have provided the pt with a written personalized care plan for preventive services. Provider list updated.. See scanned questionairre as needed for further documentation. Reviewed preventative protocols and updated unless pt declined.       Iron deficiency anemia    Update labs. Not regular with iron. Has declined further evaluation.       Relevant Orders   CBC with Differential/Platelet (Completed)   Ferritin (Completed)   Hyperlipidemia associated with type 2 diabetes mellitus (HCC)    Chronic, stable on statin - continue. The ASCVD Risk score Denman George DC Jr., et al., 2013) failed to calculate for the following reasons:   The valid total cholesterol range is 130 to 320 mg/dL       Relevant Orders   Comprehensive metabolic panel (Completed)   Lipid panel (Completed)   Hemiplegia, late effect of cerebrovascular disease (HCC)    Chronic, residual L hemiplegia after stroke.       Essential hypertension    Chronic, improved control. Continue current regimen.       Elevated alkaline phosphatase level    Liver ALP elevated when fractionated. Update labs - has declined further evaluation.       Relevant Orders   Comprehensive metabolic panel (Completed)   Gamma GT (Completed)   Diabetes mellitus with cataract (HCC)   Controlled diabetes mellitus type 2 with complications (HCC)    Chronic, stable. Continue current regimen.       Benign prostatic hyperplasia    Update PSA. Consider aging out.       Relevant Orders   PSA (Completed)   Advanced care planning/counseling discussion    Advanced directives - would want sister, Marcus Downs to be HCPOA. Advanced packetpreviouslyprovided. Needs assistance filling this out.        Other Visit Diagnoses    Need for hepatitis C screening  test       Relevant Orders   Hepatitis C antibody       No orders of the defined types were placed in this encounter.  Orders Placed This Encounter  Procedures  . Comprehensive metabolic panel  . Lipid panel  . Hemoglobin  A1c  . CBC with Differential/Platelet  . Gamma GT  . Ferritin  . PSA  . Hepatitis C antibody   Patient instructions: Labs today If interested, check with pharmacy about new 2 shot shingles series (shingrix).  Return in 3-4 months for follow up visit.  Continue current medicines.   Follow up plan: Return in about 3 months (around 08/01/2020) for follow up visit.  [name], MD

## 2020-05-01 NOTE — Patient Instructions (Addendum)
Labs today If interested, check with pharmacy about new 2 shot shingles series (shingrix).  Return in 3-4 months for follow up visit.  Continue current medicines.   Health Maintenance After Age 78 After age 60, you are at a higher risk for certain long-term diseases and infections as well as injuries from falls. Falls are a major cause of broken bones and head injuries in people who are older than age 26. Getting regular preventive care can help to keep you healthy and well. Preventive care includes getting regular testing and making lifestyle changes as recommended by your health care provider. Talk with your health care provider about:  Which screenings and tests you should have. A screening is a test that checks for a disease when you have no symptoms.  A diet and exercise plan that is right for you. What should I know about screenings and tests to prevent falls? Screening and testing are the best ways to find a health problem early. Early diagnosis and treatment give you the best chance of managing medical conditions that are common after age 13. Certain conditions and lifestyle choices may make you more likely to have a fall. Your health care provider may recommend:  Regular vision checks. Poor vision and conditions such as cataracts can make you more likely to have a fall. If you wear glasses, make sure to get your prescription updated if your vision changes.  Medicine review. Work with your health care provider to regularly review all of the medicines you are taking, including over-the-counter medicines. Ask your health care provider about any side effects that may make you more likely to have a fall. Tell your health care provider if any medicines that you take make you feel dizzy or sleepy.  Osteoporosis screening. Osteoporosis is a condition that causes the bones to get weaker. This can make the bones weak and cause them to break more easily.  Blood pressure screening. Blood pressure  changes and medicines to control blood pressure can make you feel dizzy.  Strength and balance checks. Your health care provider may recommend certain tests to check your strength and balance while standing, walking, or changing positions.  Foot health exam. Foot pain and numbness, as well as not wearing proper footwear, can make you more likely to have a fall.  Depression screening. You may be more likely to have a fall if you have a fear of falling, feel emotionally low, or feel unable to do activities that you used to do.  Alcohol use screening. Using too much alcohol can affect your balance and may make you more likely to have a fall. What actions can I take to lower my risk of falls? General instructions  Talk with your health care provider about your risks for falling. Tell your health care provider if: ? You fall. Be sure to tell your health care provider about all falls, even ones that seem minor. ? You feel dizzy, sleepy, or off-balance.  Take over-the-counter and prescription medicines only as told by your health care provider. These include any supplements.  Eat a healthy diet and maintain a healthy weight. A healthy diet includes low-fat dairy products, low-fat (lean) meats, and fiber from whole grains, beans, and lots of fruits and vegetables. Home safety  Remove any tripping hazards, such as rugs, cords, and clutter.  Install safety equipment such as grab bars in bathrooms and safety rails on stairs.  Keep rooms and walkways well-lit. Activity   Follow a regular exercise program to  stay fit. This will help you maintain your balance. Ask your health care provider what types of exercise are appropriate for you.  If you need a cane or [place], use it as recommended by your health care provider.  Wear supportive shoes that have nonskid soles. Lifestyle  Do not drink alcohol if your health care provider tells you not to drink.  If you drink alcohol, limit how much you  have: ? 0-1 drink a day for women. ? 0-2 drinks a day for men.  Be aware of how much alcohol is in your drink. In the [place] one drink equals one typical bottle of beer (12 oz), one-half glass of wine (5 oz), or one shot of hard liquor (1 oz).  Do not use any products that contain nicotine or tobacco, such as cigarettes and e-cigarettes. If you need help quitting, ask your health care provider. Summary  Having a healthy lifestyle and getting preventive care can help to protect your health and wellness after age 55.  Screening and testing are the best way to find a health problem early and help you avoid having a fall. Early diagnosis and treatment give you the best chance for managing medical conditions that are more common for people who are older than age 60.  Falls are a major cause of broken bones and head injuries in people who are older than age 11. Take precautions to prevent a fall at home.  Work with your health care provider to learn what changes you can make to improve your health and wellness and to prevent falls. This information is not intended to replace advice given to you by your health care provider. Make sure you discuss any questions you have with your health care provider. Document Revised: 02/25/2019 Document Reviewed: 09/17/2017 Elsevier Patient Education  2020 [employer].

## 2020-05-01 NOTE — Assessment & Plan Note (Signed)

## 2020-05-02 ENCOUNTER — Telehealth: Payer: Self-pay

## 2020-05-02 ENCOUNTER — Ambulatory Visit: Payer: Medicare Other | Admitting: Family Medicine

## 2020-05-02 LAB — HEPATITIS C ANTIBODY
Hepatitis C Ab: NONREACTIVE
SIGNAL TO CUT-OFF: 0.02 (ref <1.00)

## 2020-05-02 NOTE — Assessment & Plan Note (Signed)
Advanced directives - would want sister, Lafonda Mosses to be HCPOA. Advanced packetpreviouslyprovided. Needs assistance filling this out.

## 2020-05-02 NOTE — Telephone Encounter (Signed)
Left message for patient to call office regarding results and recommendations. 

## 2020-05-02 NOTE — Assessment & Plan Note (Signed)
Sees ophthalmology - may be due for f/u.

## 2020-05-02 NOTE — Assessment & Plan Note (Signed)
Chronic, improved control. Continue current regimen.

## 2020-05-02 NOTE — Assessment & Plan Note (Addendum)
Chronic, residual L hemiplegia after stroke.

## 2020-05-02 NOTE — Assessment & Plan Note (Signed)
Chronic, stable on statin - continue. The ASCVD Risk score (Goff DC Jr., et al., 2013) failed to calculate for the following reasons:   The valid total cholesterol range is 130 to 320 mg/dL  

## 2020-05-02 NOTE — Assessment & Plan Note (Signed)
Update labs. Not regular with iron. Has declined further evaluation.

## 2020-05-02 NOTE — Assessment & Plan Note (Signed)
Liver ALP elevated when fractionated. Update labs - has declined further evaluation.

## 2020-05-02 NOTE — Assessment & Plan Note (Signed)
Update labs. Continue losartan.

## 2020-05-02 NOTE — Assessment & Plan Note (Signed)
Weight loss stabilizing - continue to monitor. Has declined further evaluation in the past.

## 2020-05-02 NOTE — Telephone Encounter (Signed)
-----   Message from [name], MD sent at 05/02/2020  8:02 AM EDT ----- Plz notify iron stores are low - rec start OTC iron tablet (ferrous sulfate 325mg ) daily or at least every other day. One liver function remains high. A1c was higher at 8.2% - work on low sugar diet, continue current diabetes medicines.  Prostate was ok, kidneys stable, cholesterol levels normal on medication. Blood counts ok.

## 2020-05-02 NOTE — Assessment & Plan Note (Signed)
Update PSA. Consider aging out.

## 2020-05-06 ENCOUNTER — Other Ambulatory Visit: Payer: Self-pay | Admitting: Family Medicine

## 2020-05-29 ENCOUNTER — Ambulatory Visit (INDEPENDENT_AMBULATORY_CARE_PROVIDER_SITE_OTHER): Payer: Medicare Other | Admitting: Podiatry

## 2020-05-29 ENCOUNTER — Encounter: Payer: Self-pay | Admitting: Podiatry

## 2020-05-29 ENCOUNTER — Other Ambulatory Visit: Payer: Self-pay

## 2020-05-29 DIAGNOSIS — M79675 Pain in left toe(s): Secondary | ICD-10-CM | POA: Diagnosis not present

## 2020-05-29 DIAGNOSIS — E1159 Type 2 diabetes mellitus with other circulatory complications: Secondary | ICD-10-CM

## 2020-05-29 DIAGNOSIS — B351 Tinea unguium: Secondary | ICD-10-CM | POA: Diagnosis not present

## 2020-05-29 DIAGNOSIS — M79674 Pain in right toe(s): Secondary | ICD-10-CM | POA: Diagnosis not present

## 2020-05-29 NOTE — Progress Notes (Signed)
This patient returns to my office for at risk foot care.  This patient requires this care by a professional since this patient will be at risk due to having diabetes and renal insufficiency.  .  This patient is unable to cut nails himself since the patient cannot reach his nails.  These nails are painful walking and wearing shoes.  This patient presents for at risk foot care today.  General Appearance  Alert, conversant and in no acute stress.  Vascular  Dorsalis pedis are weakly  palpable  bilaterally. Posterior tibial pulses are absent  B/L. Capillary return is within normal limits  bilaterally. Temperature is within normal limits  bilaterally.  Neurologic  Deferred.  Nails Thick disfigured discolored nails with subungual debris  from hallux to fifth toes bilaterally. No evidence of bacterial infection or drainage bilaterally.  Orthopedic  No limitations of motion  feet .  No crepitus or effusions noted.  No bony pathology or digital deformities noted.  Skin  normotropic skin with no porokeratosis noted bilaterally.  No signs of infections or ulcers noted.     Onychomycosis  Pain in right toes  Pain in left toes  Consent was obtained for treatment procedures.   Mechanical debridement of nails 1-5  bilaterally performed with a nail nipper.  Filed with dremel without incident.    Return office visit    3 months                 Told patient to return for periodic foot care and evaluation due to potential at risk complications.   Helane Gunther DPM

## 2020-06-07 DIAGNOSIS — H401132 Primary open-angle glaucoma, bilateral, moderate stage: Secondary | ICD-10-CM | POA: Diagnosis not present

## 2020-06-10 ENCOUNTER — Other Ambulatory Visit: Payer: Self-pay | Admitting: Family Medicine

## 2020-06-15 ENCOUNTER — Other Ambulatory Visit: Payer: Self-pay | Admitting: Family Medicine

## 2020-06-17 ENCOUNTER — Other Ambulatory Visit: Payer: Self-pay | Admitting: Family Medicine

## 2020-08-01 ENCOUNTER — Other Ambulatory Visit: Payer: Self-pay | Admitting: Family Medicine

## 2020-08-06 ENCOUNTER — Emergency Department (HOSPITAL_COMMUNITY): Payer: Medicare Other

## 2020-08-06 ENCOUNTER — Encounter (HOSPITAL_COMMUNITY): Payer: Self-pay

## 2020-08-06 ENCOUNTER — Inpatient Hospital Stay (HOSPITAL_COMMUNITY)
Admission: EM | Admit: 2020-08-06 | Discharge: 2020-08-14 | DRG: 521 | Disposition: A | Discharge status: Skilled Nursing Facility | Payer: Medicare Other | Attending: Family Medicine | Admitting: Family Medicine

## 2020-08-06 ENCOUNTER — Other Ambulatory Visit: Payer: Self-pay

## 2020-08-06 DIAGNOSIS — S42215A Unspecified nondisplaced fracture of surgical neck of left humerus, initial encounter for closed fracture: Secondary | ICD-10-CM | POA: Diagnosis present

## 2020-08-06 DIAGNOSIS — S72002D Fracture of unspecified part of neck of left femur, subsequent encounter for closed fracture with routine healing: Secondary | ICD-10-CM | POA: Diagnosis not present

## 2020-08-06 DIAGNOSIS — Z794 Long term (current) use of insulin: Secondary | ICD-10-CM

## 2020-08-06 DIAGNOSIS — N189 Chronic kidney disease, unspecified: Secondary | ICD-10-CM | POA: Diagnosis not present

## 2020-08-06 DIAGNOSIS — I491 Atrial premature depolarization: Secondary | ICD-10-CM | POA: Diagnosis not present

## 2020-08-06 DIAGNOSIS — Z96649 Presence of unspecified artificial hip joint: Secondary | ICD-10-CM

## 2020-08-06 DIAGNOSIS — R739 Hyperglycemia, unspecified: Secondary | ICD-10-CM | POA: Diagnosis not present

## 2020-08-06 DIAGNOSIS — D509 Iron deficiency anemia, unspecified: Secondary | ICD-10-CM | POA: Diagnosis present

## 2020-08-06 DIAGNOSIS — Z9842 Cataract extraction status, left eye: Secondary | ICD-10-CM | POA: Diagnosis not present

## 2020-08-06 DIAGNOSIS — I340 Nonrheumatic mitral (valve) insufficiency: Secondary | ICD-10-CM | POA: Diagnosis not present

## 2020-08-06 DIAGNOSIS — W010XXA Fall on same level from slipping, tripping and stumbling without subsequent striking against object, initial encounter: Secondary | ICD-10-CM | POA: Diagnosis present

## 2020-08-06 DIAGNOSIS — Z9114 Patient's other noncompliance with medication regimen: Secondary | ICD-10-CM | POA: Diagnosis not present

## 2020-08-06 DIAGNOSIS — I10 Essential (primary) hypertension: Secondary | ICD-10-CM | POA: Diagnosis present

## 2020-08-06 DIAGNOSIS — Z79899 Other long term (current) drug therapy: Secondary | ICD-10-CM

## 2020-08-06 DIAGNOSIS — R2681 Unsteadiness on feet: Secondary | ICD-10-CM | POA: Diagnosis not present

## 2020-08-06 DIAGNOSIS — I639 Cerebral infarction, unspecified: Secondary | ICD-10-CM | POA: Diagnosis not present

## 2020-08-06 DIAGNOSIS — S72012A Unspecified intracapsular fracture of left femur, initial encounter for closed fracture: Secondary | ICD-10-CM | POA: Diagnosis present

## 2020-08-06 DIAGNOSIS — S7292XA Unspecified fracture of left femur, initial encounter for closed fracture: Secondary | ICD-10-CM | POA: Diagnosis not present

## 2020-08-06 DIAGNOSIS — Z7982 Long term (current) use of aspirin: Secondary | ICD-10-CM

## 2020-08-06 DIAGNOSIS — M47812 Spondylosis without myelopathy or radiculopathy, cervical region: Secondary | ICD-10-CM | POA: Diagnosis present

## 2020-08-06 DIAGNOSIS — Z8249 Family history of ischemic heart disease and other diseases of the circulatory system: Secondary | ICD-10-CM

## 2020-08-06 DIAGNOSIS — R9431 Abnormal electrocardiogram [ECG] [EKG]: Secondary | ICD-10-CM | POA: Diagnosis not present

## 2020-08-06 DIAGNOSIS — R52 Pain, unspecified: Secondary | ICD-10-CM | POA: Diagnosis not present

## 2020-08-06 DIAGNOSIS — Z7401 Bed confinement status: Secondary | ICD-10-CM

## 2020-08-06 DIAGNOSIS — Z91048 Other nonmedicinal substance allergy status: Secondary | ICD-10-CM

## 2020-08-06 DIAGNOSIS — S72002A Fracture of unspecified part of neck of left femur, initial encounter for closed fracture: Secondary | ICD-10-CM | POA: Diagnosis present

## 2020-08-06 DIAGNOSIS — I69354 Hemiplegia and hemiparesis following cerebral infarction affecting left non-dominant side: Secondary | ICD-10-CM

## 2020-08-06 DIAGNOSIS — W19XXXA Unspecified fall, initial encounter: Secondary | ICD-10-CM | POA: Diagnosis not present

## 2020-08-06 DIAGNOSIS — L8990 Pressure ulcer of unspecified site, unspecified stage: Secondary | ICD-10-CM | POA: Insufficient documentation

## 2020-08-06 DIAGNOSIS — E1122 Type 2 diabetes mellitus with diabetic chronic kidney disease: Secondary | ICD-10-CM | POA: Diagnosis not present

## 2020-08-06 DIAGNOSIS — I4510 Unspecified right bundle-branch block: Secondary | ICD-10-CM | POA: Diagnosis present

## 2020-08-06 DIAGNOSIS — Z20822 Contact with and (suspected) exposure to covid-19: Secondary | ICD-10-CM | POA: Diagnosis present

## 2020-08-06 DIAGNOSIS — E860 Dehydration: Secondary | ICD-10-CM | POA: Diagnosis present

## 2020-08-06 DIAGNOSIS — I495 Sick sinus syndrome: Secondary | ICD-10-CM | POA: Diagnosis present

## 2020-08-06 DIAGNOSIS — E11649 Type 2 diabetes mellitus with hypoglycemia without coma: Secondary | ICD-10-CM | POA: Diagnosis present

## 2020-08-06 DIAGNOSIS — N179 Acute kidney failure, unspecified: Secondary | ICD-10-CM | POA: Diagnosis present

## 2020-08-06 DIAGNOSIS — M79605 Pain in left leg: Secondary | ICD-10-CM | POA: Diagnosis not present

## 2020-08-06 DIAGNOSIS — Z471 Aftercare following joint replacement surgery: Secondary | ICD-10-CM | POA: Diagnosis not present

## 2020-08-06 DIAGNOSIS — Z961 Presence of intraocular lens: Secondary | ICD-10-CM | POA: Diagnosis present

## 2020-08-06 DIAGNOSIS — I4891 Unspecified atrial fibrillation: Secondary | ICD-10-CM | POA: Diagnosis present

## 2020-08-06 DIAGNOSIS — H401190 Primary open-angle glaucoma, unspecified eye, stage unspecified: Secondary | ICD-10-CM | POA: Diagnosis present

## 2020-08-06 DIAGNOSIS — E785 Hyperlipidemia, unspecified: Secondary | ICD-10-CM | POA: Diagnosis present

## 2020-08-06 DIAGNOSIS — E119 Type 2 diabetes mellitus without complications: Secondary | ICD-10-CM | POA: Diagnosis not present

## 2020-08-06 DIAGNOSIS — E1165 Type 2 diabetes mellitus with hyperglycemia: Secondary | ICD-10-CM | POA: Diagnosis not present

## 2020-08-06 DIAGNOSIS — E1110 Type 2 diabetes mellitus with ketoacidosis without coma: Secondary | ICD-10-CM | POA: Diagnosis present

## 2020-08-06 DIAGNOSIS — M6281 Muscle weakness (generalized): Secondary | ICD-10-CM | POA: Diagnosis not present

## 2020-08-06 DIAGNOSIS — E876 Hypokalemia: Secondary | ICD-10-CM | POA: Diagnosis present

## 2020-08-06 DIAGNOSIS — Z87891 Personal history of nicotine dependence: Secondary | ICD-10-CM

## 2020-08-06 DIAGNOSIS — S42402A Unspecified fracture of lower end of left humerus, initial encounter for closed fracture: Secondary | ICD-10-CM | POA: Diagnosis present

## 2020-08-06 DIAGNOSIS — Z043 Encounter for examination and observation following other accident: Secondary | ICD-10-CM | POA: Diagnosis not present

## 2020-08-06 DIAGNOSIS — E870 Hyperosmolality and hypernatremia: Secondary | ICD-10-CM | POA: Diagnosis present

## 2020-08-06 DIAGNOSIS — S42212A Unspecified displaced fracture of surgical neck of left humerus, initial encounter for closed fracture: Secondary | ICD-10-CM | POA: Diagnosis present

## 2020-08-06 DIAGNOSIS — Z96642 Presence of left artificial hip joint: Secondary | ICD-10-CM | POA: Diagnosis not present

## 2020-08-06 DIAGNOSIS — I129 Hypertensive chronic kidney disease with stage 1 through stage 4 chronic kidney disease, or unspecified chronic kidney disease: Secondary | ICD-10-CM | POA: Diagnosis not present

## 2020-08-06 DIAGNOSIS — I479 Paroxysmal tachycardia, unspecified: Secondary | ICD-10-CM | POA: Diagnosis not present

## 2020-08-06 DIAGNOSIS — R000 Tachycardia, unspecified: Secondary | ICD-10-CM | POA: Diagnosis not present

## 2020-08-06 DIAGNOSIS — I69828 Other speech and language deficits following other cerebrovascular disease: Secondary | ICD-10-CM | POA: Diagnosis not present

## 2020-08-06 DIAGNOSIS — R9082 White matter disease, unspecified: Secondary | ICD-10-CM | POA: Diagnosis not present

## 2020-08-06 DIAGNOSIS — R5381 Other malaise: Secondary | ICD-10-CM | POA: Diagnosis not present

## 2020-08-06 DIAGNOSIS — M79652 Pain in left thigh: Secondary | ICD-10-CM | POA: Diagnosis not present

## 2020-08-06 DIAGNOSIS — M25512 Pain in left shoulder: Secondary | ICD-10-CM | POA: Diagnosis not present

## 2020-08-06 DIAGNOSIS — I9589 Other hypotension: Secondary | ICD-10-CM | POA: Diagnosis not present

## 2020-08-06 DIAGNOSIS — S52182A Other fracture of upper end of left radius, initial encounter for closed fracture: Secondary | ICD-10-CM | POA: Diagnosis not present

## 2020-08-06 DIAGNOSIS — S52102A Unspecified fracture of upper end of left radius, initial encounter for closed fracture: Secondary | ICD-10-CM | POA: Diagnosis not present

## 2020-08-06 DIAGNOSIS — M25552 Pain in left hip: Secondary | ICD-10-CM | POA: Diagnosis not present

## 2020-08-06 DIAGNOSIS — S42295A Other nondisplaced fracture of upper end of left humerus, initial encounter for closed fracture: Secondary | ICD-10-CM | POA: Diagnosis not present

## 2020-08-06 DIAGNOSIS — I69891 Dysphagia following other cerebrovascular disease: Secondary | ICD-10-CM | POA: Diagnosis not present

## 2020-08-06 DIAGNOSIS — Z833 Family history of diabetes mellitus: Secondary | ICD-10-CM

## 2020-08-06 DIAGNOSIS — M2550 Pain in unspecified joint: Secondary | ICD-10-CM | POA: Diagnosis not present

## 2020-08-06 DIAGNOSIS — J189 Pneumonia, unspecified organism: Secondary | ICD-10-CM

## 2020-08-06 DIAGNOSIS — M25522 Pain in left elbow: Secondary | ICD-10-CM | POA: Diagnosis not present

## 2020-08-06 DIAGNOSIS — R1312 Dysphagia, oropharyngeal phase: Secondary | ICD-10-CM | POA: Diagnosis not present

## 2020-08-06 DIAGNOSIS — Z9181 History of falling: Secondary | ICD-10-CM | POA: Diagnosis not present

## 2020-08-06 LAB — HEPATIC FUNCTION PANEL
ALT: 43 U/L (ref 0–44)
AST: 62 U/L — ABNORMAL HIGH (ref 15–41)
Albumin: 3.5 g/dL (ref 3.5–5.0)
Alkaline Phosphatase: 172 U/L — ABNORMAL HIGH (ref 38–126)
Bilirubin, Direct: 0.3 mg/dL — ABNORMAL HIGH (ref 0.0–0.2)
Indirect Bilirubin: 1.2 mg/dL — ABNORMAL HIGH (ref 0.3–0.9)
Total Bilirubin: 1.5 mg/dL — ABNORMAL HIGH (ref 0.3–1.2)
Total Protein: 8.7 g/dL — ABNORMAL HIGH (ref 6.5–8.1)

## 2020-08-06 LAB — RAPID URINE DRUG SCREEN, HOSP PERFORMED
Amphetamines: NOT DETECTED
Barbiturates: NOT DETECTED
Benzodiazepines: NOT DETECTED
Cocaine: NOT DETECTED
Opiates: NOT DETECTED
Tetrahydrocannabinol: NOT DETECTED

## 2020-08-06 LAB — BASIC METABOLIC PANEL
Anion gap: 20 — ABNORMAL HIGH (ref 5–15)
BUN: 49 mg/dL — ABNORMAL HIGH (ref 8–23)
CO2: 19 mmol/L — ABNORMAL LOW (ref 22–32)
Calcium: 8.9 mg/dL (ref 8.9–10.3)
Chloride: 103 mmol/L (ref 98–111)
Creatinine, Ser: 1.74 mg/dL — ABNORMAL HIGH (ref 0.61–1.24)
GFR calc Af Amer: 43 mL/min — ABNORMAL LOW (ref >60)
GFR calc non Af Amer: 37 mL/min — ABNORMAL LOW (ref >60)
Glucose, Bld: 469 mg/dL — ABNORMAL HIGH (ref 70–99)
Potassium: 4.7 mmol/L (ref 3.5–5.1)
Sodium: 142 mmol/L (ref 135–145)

## 2020-08-06 LAB — CBG MONITORING, ED
Glucose-Capillary: 418 mg/dL — ABNORMAL HIGH (ref 70–99)
Glucose-Capillary: 342 mg/dL — ABNORMAL HIGH (ref 70–99)

## 2020-08-06 LAB — I-STAT CHEM 8, ED
BUN: 45 mg/dL — ABNORMAL HIGH (ref 8–23)
Calcium, Ion: 1.17 mmol/L (ref 1.15–1.40)
Chloride: 109 mmol/L (ref 98–111)
Creatinine, Ser: 1.6 mg/dL — ABNORMAL HIGH (ref 0.61–1.24)
Glucose, Bld: 460 mg/dL — ABNORMAL HIGH (ref 70–99)
HCT: 45 % (ref 39.0–52.0)
Hemoglobin: 15.3 g/dL (ref 13.0–17.0)
Potassium: 4.4 mmol/L (ref 3.5–5.1)
Sodium: 145 mmol/L (ref 135–145)
TCO2: 20 mmol/L — ABNORMAL LOW (ref 22–32)

## 2020-08-06 LAB — CBC WITH DIFFERENTIAL/PLATELET
Abs Immature Granulocytes: 0.05 10*3/uL (ref 0.00–0.07)
Basophils Absolute: 0 10*3/uL (ref 0.0–0.1)
Basophils Relative: 0 %
Eosinophils Absolute: 0 10*3/uL (ref 0.0–0.5)
Eosinophils Relative: 0 %
HCT: 43.6 % (ref 39.0–52.0)
Hemoglobin: 14.9 g/dL (ref 13.0–17.0)
Immature Granulocytes: 0 %
Lymphocytes Relative: 8 %
Lymphs Abs: 1 10*3/uL (ref 0.7–4.0)
MCH: 25.6 pg — ABNORMAL LOW (ref 26.0–34.0)
MCHC: 34.2 g/dL (ref 30.0–36.0)
MCV: 74.8 fL — ABNORMAL LOW (ref 80.0–100.0)
Monocytes Absolute: 1 10*3/uL (ref 0.1–1.0)
Monocytes Relative: 8 %
Neutro Abs: 10.3 10*3/uL — ABNORMAL HIGH (ref 1.7–7.7)
Neutrophils Relative %: 84 %
Platelets: 223 10*3/uL (ref 150–400)
RBC: 5.83 MIL/uL — ABNORMAL HIGH (ref 4.22–5.81)
RDW: 20.9 % — ABNORMAL HIGH (ref 11.5–15.5)
WBC: 12.4 10*3/uL — ABNORMAL HIGH (ref 4.0–10.5)
nRBC: 0 % (ref 0.0–0.2)

## 2020-08-06 LAB — URINALYSIS, ROUTINE W REFLEX MICROSCOPIC
Bacteria, UA: NONE SEEN
Bilirubin Urine: NEGATIVE
Glucose, UA: >=500 mg/dL — AB
Ketones, ur: 20 mg/dL — AB
Leukocytes,Ua: NEGATIVE
Nitrite: NEGATIVE
Protein, ur: NEGATIVE mg/dL
Specific Gravity, Urine: 1.024 (ref 1.005–1.030)
pH: 5 (ref 5.0–8.0)

## 2020-08-06 LAB — BLOOD GAS, VENOUS
Acid-base deficit: 4.5 mmol/L — ABNORMAL HIGH (ref 0.0–2.0)
Bicarbonate: 20.5 mmol/L (ref 20.0–28.0)
O2 Saturation: 69.6 %
Patient temperature: 98.6
pCO2, Ven: 39.6 mmHg — ABNORMAL LOW (ref 44.0–60.0)
pH, Ven: 7.333 (ref 7.250–7.430)
pO2, Ven: 44.9 mmHg (ref 32.0–45.0)

## 2020-08-06 LAB — ETHANOL: Alcohol, Ethyl (B): <10 mg/dL (ref <10)

## 2020-08-06 LAB — LACTIC ACID, PLASMA: Lactic Acid, Venous: 2.5 mmol/L (ref 0.5–1.9)

## 2020-08-06 LAB — LIPASE, BLOOD: Lipase: 19 U/L (ref 11–51)

## 2020-08-06 LAB — SARS CORONAVIRUS 2 BY RT PCR (HOSPITAL ORDER, PERFORMED IN ~~LOC~~ HOSPITAL LAB): SARS Coronavirus 2: NEGATIVE

## 2020-08-06 MED ORDER — MORPHINE SULFATE (PF) 2 MG/ML IV SOLN
2.0000 mg | INTRAVENOUS | Status: DC | PRN
  Administered 2020-08-07 – 2020-08-08 (×3): 2 mg via INTRAVENOUS
  Filled 2020-08-06 (×3): qty 1

## 2020-08-06 MED ORDER — METOPROLOL TARTRATE 5 MG/5ML IV SOLN
5.0000 mg | Freq: Once | INTRAVENOUS | Status: AC
  Administered 2020-08-06: 5 mg via INTRAVENOUS
  Filled 2020-08-06: qty 5

## 2020-08-06 MED ORDER — INSULIN ASPART 100 UNIT/ML ~~LOC~~ SOLN
0.0000 [IU] | Freq: Three times a day (TID) | SUBCUTANEOUS | Status: DC
  Administered 2020-08-06: 15 [IU] via SUBCUTANEOUS
  Administered 2020-08-07: 4 [IU] via SUBCUTANEOUS
  Administered 2020-08-07: 7 [IU] via SUBCUTANEOUS
  Administered 2020-08-07 – 2020-08-08 (×3): 4 [IU] via SUBCUTANEOUS
  Administered 2020-08-08: 7 [IU] via SUBCUTANEOUS
  Administered 2020-08-08: 4 [IU] via SUBCUTANEOUS
  Administered 2020-08-09: 7 [IU] via SUBCUTANEOUS
  Administered 2020-08-09: 11 [IU] via SUBCUTANEOUS
  Administered 2020-08-09 – 2020-08-10 (×2): 4 [IU] via SUBCUTANEOUS
  Filled 2020-08-06: qty 0.2

## 2020-08-06 MED ORDER — INSULIN ASPART PROT & ASPART (70-30 MIX) 100 UNIT/ML ~~LOC~~ SUSP
25.0000 [IU] | Freq: Two times a day (BID) | SUBCUTANEOUS | Status: DC
  Administered 2020-08-07 – 2020-08-10 (×4): 25 [IU] via SUBCUTANEOUS
  Filled 2020-08-06 (×2): qty 10

## 2020-08-06 MED ORDER — MAGNESIUM HYDROXIDE 400 MG/5ML PO SUSP: 30.0000 mL | Freq: Every day | ORAL | Status: DC | PRN

## 2020-08-06 MED ORDER — ATORVASTATIN CALCIUM 40 MG PO TABS
40.0000 mg | ORAL_TABLET | Freq: Every day | ORAL | Status: DC
  Administered 2020-08-07 – 2020-08-14 (×7): 40 mg via ORAL
  Filled 2020-08-06 (×8): qty 1

## 2020-08-06 MED ORDER — ONDANSETRON HCL 4 MG PO TABS: 4.0000 mg | ORAL_TABLET | Freq: Four times a day (QID) | ORAL | Status: DC | PRN

## 2020-08-06 MED ORDER — FERROUS SULFATE 325 (65 FE) MG PO TABS
325.0000 mg | ORAL_TABLET | ORAL | Status: DC
  Administered 2020-08-07: 325 mg via ORAL
  Filled 2020-08-06: qty 1

## 2020-08-06 MED ORDER — SODIUM CHLORIDE 0.9 % IV SOLN: INTRAVENOUS | Status: DC

## 2020-08-06 MED ORDER — AMLODIPINE BESYLATE 5 MG PO TABS
10.0000 mg | ORAL_TABLET | Freq: Every day | ORAL | Status: DC
  Administered 2020-08-07: 10 mg via ORAL
  Filled 2020-08-06: qty 2

## 2020-08-06 MED ORDER — SODIUM CHLORIDE 0.9 % IV BOLUS
500.0000 mL | Freq: Once | INTRAVENOUS | Status: AC
  Administered 2020-08-06: 500 mL via INTRAVENOUS

## 2020-08-06 MED ORDER — ACETAMINOPHEN 650 MG RE SUPP: 650.0000 mg | Freq: Four times a day (QID) | RECTAL | Status: DC | PRN

## 2020-08-06 MED ORDER — DILTIAZEM HCL ER COATED BEADS 180 MG PO CP24
300.0000 mg | ORAL_CAPSULE | Freq: Every day | ORAL | Status: DC
  Administered 2020-08-07: 300 mg via ORAL
  Filled 2020-08-06: qty 1

## 2020-08-06 MED ORDER — LATANOPROST 0.005 % OP SOLN
1.0000 [drp] | Freq: Every day | OPHTHALMIC | Status: DC
  Administered 2020-08-06 – 2020-08-13 (×8): 1 [drp] via OPHTHALMIC
  Filled 2020-08-06: qty 2.5

## 2020-08-06 MED ORDER — BRIMONIDINE TARTRATE 0.15 % OP SOLN: 1.0000 [drp] | Freq: Three times a day (TID) | OPHTHALMIC | Status: DC

## 2020-08-06 MED ORDER — SODIUM CHLORIDE 0.9 % IV BOLUS
1000.0000 mL | Freq: Once | INTRAVENOUS | Status: AC
  Administered 2020-08-06: 1000 mL via INTRAVENOUS

## 2020-08-06 MED ORDER — ACETAMINOPHEN 325 MG PO TABS: 650.0000 mg | ORAL_TABLET | Freq: Four times a day (QID) | ORAL | Status: DC | PRN

## 2020-08-06 MED ORDER — ONDANSETRON HCL 4 MG/2ML IJ SOLN
4.0000 mg | Freq: Four times a day (QID) | INTRAMUSCULAR | Status: DC | PRN
  Administered 2020-08-07: 4 mg via INTRAVENOUS
  Filled 2020-08-06: qty 2

## 2020-08-06 MED ORDER — POTASSIUM CHLORIDE ER 10 MEQ PO TBCR
30.0000 meq | EXTENDED_RELEASE_TABLET | Freq: Every day | ORAL | Status: DC
  Administered 2020-08-07: 30 meq via ORAL
  Filled 2020-08-06 (×2): qty 3

## 2020-08-06 MED ORDER — LATANOPROSTENE BUNOD 0.024 % OP SOLN: 1.0000 [drp] | Freq: Every day | OPHTHALMIC | Status: DC

## 2020-08-06 MED ORDER — AMLODIPINE-ATORVASTATIN 10-40 MG PO TABS: 1.0000 | ORAL_TABLET | Freq: Every day | ORAL | Status: DC

## 2020-08-06 MED ORDER — TRAZODONE HCL 50 MG PO TABS: 25.0000 mg | ORAL_TABLET | Freq: Every evening | ORAL | Status: DC | PRN

## 2020-08-06 MED ORDER — SODIUM CHLORIDE 0.9 % IV SOLN: Freq: Once | INTRAVENOUS | Status: AC

## 2020-08-06 NOTE — ED Triage Notes (Signed)
Per EMS, Pt is coming from home. Called out today complaining of left leg pain related to a fall on the 16th. EMS responded but pt did not want to be seen. Family visited pt today to noted he was not able to leave his wheel chair since fall. Shortening of the left leg noted today by EMS. When CBG was checked it read high, pt states he has not been taking his medication or eating consistently since fall. Pt does live alone. Pt does have other left sided deficits related to previous stroke. Also noted by EMS that he has AFIB but not on any blood thinners.

## 2020-08-06 NOTE — H&P (Signed)
[place]   PATIENT NAME: Marcus Downs    MR#:  [id number]  DATE OF BIRTH:  01/03/1941  DATE OF ADMISSION:  08/06/2020  PRIMARY CARE PHYSICIAN: Ria Bush, MD   REQUESTING/REFERRING PHYSICIAN: Nuala Alpha, PA-C  CHIEF COMPLAINT:   Chief Complaint  Patient presents with  . Fall  . Hyperglycemia    HISTORY OF PRESENT ILLNESS:  Marcus Downs  is a 79 y.o. male who comes from home, with a known history of type 2 diabetes mellitus, hypertension, dyslipidemia and CVA with residual left-sided weakness, who presented to the emergency room, with acute onset of left hip pain after having a mechanical accidental fall. The patient stated that he was getting close out of the dryer his laundry and he slipped losing balance and falling on the left side with subsequent left hip and shoulder pain.  He denied any presyncope or syncope.  No paresthesias or new focal muscle weakness.  He denied any headache or dizziness or blurred vision.  No chest pain or palpitations.  He had nausea and vomiting earlier this morning with no abdominal pain.  No bilious vomitus or hematemesis.  No dysuria, oliguria or hematuria or flank pain.  He admits to polyuria and polydipsia.  He does state his insulin doses since Friday.  Upon presentation to the emergency room, blood pressure was 144/79 with a pulse of 131 and otherwise normal vital signs.  Labs revealed a blood glucose of 469 and a CO2 of 19, BUN of 49 and a creatinine of 1.74  Up from 21/1.31 on 05/01/2020 and LFTs were remarkable for alk phos 172, AST 62 and total protein 8.7 with total bili 1.5.  VBG showed pH 7.33, PCO2 39.6, PO2 44 bicarbonate of 20.5.  Noncontrasted head CT scan revealed generalized cerebral atrophy, chronic predominant right parietal lobe infarct with no acute intracranial abnormality.  C-spine CT showed cervical spine spondylosis most notable from C5-C7 with no acute fracture or malalignment of the spine. EKG showed sinus and  possibly ectopic atrial tachycardia with a rate of 128 with multiple PVCs, right bundle branch block.  Alcohol level was less than 10.  COVID-19 PCR came back negative.  Chest x-ray showed minimal atelectasis in the left base with otherwise acute abnormalities.  Left elbow x-ray showed irregularity at the radial head on the lateral view concerning for subtle fracture though evaluation was limited due to positioning.  There was no joint effusion identified.  Left femur x-ray showed no fracture.  Left humerus x-ray showed subtle nondisplaced fracture through the neck of the humerus only seen on one image of the humeral films and one image on the left shoulder films.  Left hip x-ray showed subcapital fracture through the proximal left hip with limited evaluation for dislocation without lateral view but there was none on the study.  Left shoulder x-ray showed a subtle fracture through the neck of the left humerus.  The patient was given 1.5 L bolus of IV normal saline followed 100 mL/h.  He will be admitted to a telemetry bed for further evaluation and management. PAST MEDICAL HISTORY:   Past Medical History:  Diagnosis Date  . CRI (chronic renal insufficiency)   . 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 Diabetes mellitus type II 1992  . HLD (hyperlipidemia) 10/1998  . HTN (hypertension) 10/1998  . Primary open angle glaucoma    [name]  . RBBB   . Stroke [facility])  Left sided weakness    PAST SURGICAL HISTORY:   Past Surgical History:  Procedure Laterality Date  . CATARACT EXTRACTION W/PHACO  09/09/2012   Procedure: CATARACT EXTRACTION PHACO AND INTRAOCULAR LENS PLACEMENT (IOC);  Surgeon: Marylynn Pearson, MD;  Location: [place];  Service: Ophthalmology;  Laterality: Right;  . CATARACT EXTRACTION W/PHACO Left 03/31/2013   Procedure: CATARACT EXTRACTION PHACO AND INTRAOCULAR LENS PLACEMENT (IOC);  Surgeon: Marylynn Pearson, MD;  Location: [place];  Service: Ophthalmology;   Laterality: Left;  . EYE SURGERY    . lipoma removal  1970's   Right shoulder  . US ECHOCARDIOGRAPHY  03/2013   mod LVH, EF 60%, normal wall motion    SOCIAL HISTORY:   Social History   Tobacco Use  . Smoking status: Former Smoker    Packs/day: 1.00    Years: 30.00    Pack years: 30.00    Types: Cigarettes    Quit date: 09/07/1996    Years since quitting: 23.9  . Smokeless tobacco: Never Used  . Tobacco comment: quit after CVA  Substance Use Topics  . Alcohol use: No    FAMILY HISTORY:   Family History  Problem Relation Age of Onset  . Heart attack Father 78  . Hypertension Sister        Multiple medical problems  . Diabetes Sister   . Stroke Neg Hx   . Cancer Neg Hx     DRUG ALLERGIES:   Allergies  Allergen Reactions  . Tape Rash    [place]    REVIEW OF SYSTEMS:   ROS As per history of present illness. All pertinent systems were reviewed above. Constitutional, HEENT, cardiovascular, respiratory, GI, GU, musculoskeletal, neuro, psychiatric, endocrine, integumentary and hematologic systems were reviewed and are otherwise negative/unremarkable except for positive findings mentioned above in the HPI.   MEDICATIONS AT HOME:   Prior to Admission medications   Medication Sig Start Date End Date Taking? Authorizing Provider  ALPHAGAN P 0.1 % SOLN  11/03/19   [provider]  amLODipine-atorvastatin (CADUET) 10-40 MG tablet TAKE 1 TABLET BY MOUTH EVERY DAY 08/02/20   Ria Bush, MD  aspirin 325 MG EC tablet Take 325 mg by mouth daily. For stroke prevention      [provider]  brimonidine (ALPHAGAN P) 0.15 % ophthalmic solution Place 1 drop into both eyes 3 (three) times daily. 08/05/19   Ria Bush, MD  diclofenac sodium (VOLTAREN) 1 % GEL Apply 1 application topically 3 (three) times daily as needed. 04/29/18   Ria Bush, MD  diltiazem (CARDIZEM CD) 360 MG 24 hr capsule TAKE 1 CAPSULE BY MOUTH EVERY DAY 06/19/20   Ria Bush, MD  ferrous sulfate 325 (65 FE) MG tablet Take 1 tablet (325 mg total) by mouth every other day. 08/31/18   Ria Bush, MD  glucose blood [facility] BLOOD GLUCOSE TEST) test strip Check three times daily and when feeling ill E11.8 insulin dependent 12/12/17   Ria Bush, MD  hydrochlorothiazide (HYDRODIURIL) 25 MG tablet TAKE 1 TABLET BY MOUTH EVERY DAY 06/15/20   Ria Bush, MD  insulin aspart protamine- aspart (NOVOLOG MIX 70/30) (70-30) 100 UNIT/ML injection Take 25 units with breakfast and 22 units with supper. QS 1 month 04/29/18   Ria Bush, MD  JARDIANCE 10 MG TABS tablet TAKE 1 TABLET BY MOUTH EVERY DAY 06/15/20   Ria Bush, MD  latanoprost (XALATAN) 0.005 % ophthalmic solution Place 1 drop into both eyes at bedtime.    [provider]  Latanoprostene Bunod (VYZULTA) 0.024 % SOLN Apply 1 drop to eye at bedtime. 08/05/19   Ria Bush, MD  Loperamide HCl (IMODIUM PO) Take 1 tablet by mouth as needed.     [provider]  losartan (COZAAR) 100 MG tablet TAKE 1 TABLET BY MOUTH EVERY DAY 06/12/20   Ria Bush, MD  metFORMIN (GLUCOPHAGE-XR) 500 MG 24 hr tablet TAKE 1 TABLET BY MOUTH EVERY DAY WITH BREAKFAST 06/12/20   Ria Bush, MD  potassium chloride (KLOR-CON) 10 MEQ tablet TAKE 3 TABLETS (30 MEQ TOTAL) BY MOUTH DAILY. 06/12/20   Ria Bush, MD      VITAL SIGNS:  Blood pressure 140/86, pulse (!) 124, temperature 99.1 F (37.3 C), temperature source Oral, resp. rate 20, height 5' 11.5" (1.816 m), weight 87.5 kg, SpO2 98 %.  PHYSICAL EXAMINATION:  Physical Exam  GENERAL:  79 y.o.-year-old African-American male patient lying in the bed with no acute distress.  EYES: Pupils equal, round, reactive to light and accommodation. No scleral icterus. Extraocular muscles intact.  HEENT: Head atraumatic, normocephalic. Oropharynx and nasopharynx clear.  NECK:  Supple, no jugular venous distention. No thyroid  enlargement, no tenderness.  LUNGS: Normal breath sounds bilaterally, no wheezing, rales,rhonchi or crepitation. No use of accessory muscles of respiration.  CARDIOVASCULAR: Regular rate and rhythm, S1, S2 normal. No murmurs, rubs, or gallops.  ABDOMEN: Soft, nondistended, nontender. Bowel sounds present. No organomegaly or mass.  EXTREMITIES: No pedal edema, cyanosis, or clubbing.  NEUROLOGIC: Cranial nerves II through XII are intact. Muscle strength 5/5 in all extremities.   Sensation intact. Gait not checked. Musculoskeletal, left hip and left shoulder tenderness. PSYCHIATRIC: The patient is alert and oriented x 3.  Normal affect and good eye contact. SKIN: No obvious rash, lesion, or ulcer.   LABORATORY PANEL:   CBC Recent Labs  Lab 08/06/20 1801 08/06/20 1801 08/06/20 1808  WBC 12.4*  --   --   HGB 14.9   < > 15.3  HCT 43.6   < > 45.0  PLT 223  --   --    < > = values in this interval not displayed.   ------------------------------------------------------------------------------------------------------------------  Chemistries  Recent Labs  Lab 08/06/20 1801 08/06/20 1801 08/06/20 1808  NA 142   < > 145  K 4.7   < > 4.4  CL 103   < > 109  CO2 19*  --   --   GLUCOSE 469*   < > 460*  BUN 49*   < > 45*  CREATININE 1.74*   < > 1.60*  CALCIUM 8.9  --   --   AST 62*  --   --   ALT 43  --   --   ALKPHOS 172*  --   --   BILITOT 1.5*  --   --    < > = values in this interval not displayed.   ------------------------------------------------------------------------------------------------------------------  Cardiac Enzymes No results for input(s): TROPONINI in the last 168 hours. ------------------------------------------------------------------------------------------------------------------  RADIOLOGY:  DG Chest 1 View  Result Date: 08/06/2020 CLINICAL DATA:  Left leg pain related to fall. EXAM: CHEST  1 VIEW COMPARISON:  October 30, 2013 FINDINGS: The heart,  hila, and mediastinum are normal. No pneumothorax. Minimal atelectasis in the left base. The lungs are otherwise clear. No other acute abnormalities. IMPRESSION: No active disease. Electronically Signed   By: Dorise Bullion III M.D   On: 08/06/2020 18:08   DG Pelvis 1-2 Views  Result Date: 08/06/2020 CLINICAL DATA:  Pain  after fall EXAM: PELVIS - 1-2 VIEW COMPARISON:  None. FINDINGS: A subcapital fracture seen to the left hip. Evaluation for dislocation is limited as only an AP view was obtained. No definite dislocation noted. No other bony abnormalities are identified. IMPRESSION: Subcapital fracture through the proximal left hip. Evaluation for dislocation is limited without a lateral view but there is no evidence of dislocation on this study. Electronically Signed   By: Dorise Bullion III M.D   On: 08/06/2020 18:09   DG Elbow Complete Left  Result Date: 08/06/2020 CLINICAL DATA:  Pain after fall EXAM: LEFT ELBOW - COMPLETE 3+ VIEW COMPARISON:  None. FINDINGS: Evaluation is limited due to positioning. The proximal ulna and radius are not well visualized on the first 3 images as a overlap the humerus. No joint effusion is seen on the lateral view. Mild irregularity of the radial head on the lateral view suggest the possibility of a subtle fracture. No other abnormalities. IMPRESSION: Evaluation is limited due to positioning as above. Irregularity of the radial head on the lateral view is concerning for a subtle fracture. No joint effusion identified. Electronically Signed   By: Dorise Bullion III M.D   On: 08/06/2020 18:07   CT Head Wo Contrast  Result Date: 08/06/2020 CLINICAL DATA:  Status post fall. EXAM: CT HEAD WITHOUT CONTRAST TECHNIQUE: Contiguous axial images were obtained from the base of the skull through the vertex without intravenous contrast. COMPARISON:  October 30, 2013 FINDINGS: Brain: There is mild to moderate severity cerebral atrophy with widening of the extra-axial spaces and  ventricular dilatation. There are areas of decreased attenuation within the white matter tracts of the supratentorial brain, consistent with microvascular disease changes. A large area of cortical encephalomalacia, with adjacent chronic white matter low attenuation, is seen throughout the right hemisphere. This involves predominantly the right parietal lobe and is unchanged in appearance when compared to the prior study. Vascular: No hyperdense vessel or unexpected calcification. Skull: Normal. Negative for fracture or focal lesion. Sinuses/Orbits: No acute finding. Other: None. IMPRESSION: 1. No acute intracranial abnormality. 2. Generalized cerebral atrophy. 3. Chronic predominant right parietal lobe infarct. Electronically Signed   By: Virgina Norfolk M.D.   On: 08/06/2020 19:00   CT Cervical Spine Wo Contrast  Result Date: 08/06/2020 CLINICAL DATA:  Left leg pain related to fall EXAM: CT CERVICAL SPINE WITHOUT CONTRAST TECHNIQUE: Multidetector CT imaging of the cervical spine was performed without intravenous contrast. Multiplanar CT image reconstructions were also generated. COMPARISON:  None. FINDINGS: Alignment: There is straightening of the normal cervical lordosis. Skull base and vertebrae: Visualized skull base is intact. No atlanto-occipital dissociation. The vertebral body heights are well maintained. No fracture or pathologic osseous lesion seen. Soft tissues and spinal canal: The visualized paraspinal soft tissues are unremarkable. No prevertebral soft tissue swelling is seen. The spinal canal is grossly unremarkable, no large epidural collection or significant canal narrowing. Disc levels: Multilevel cervical spine spondylosis is seen with anterior osteophytes, disc osteophyte complex and uncovertebral osteophytes most notable C5-C6 and C6-C7 with moderate neural foraminal narrowing and mild central canal stenosis. Upper chest: The lung apices are clear. Thoracic inlet is within normal limits.  Other: None IMPRESSION: No acute fracture or malalignment of the spine. 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 Electronically Signed   By: Prudencio Pair M.D.   On: 08/06/2020 19:25   DG Shoulder Left  Addendum Date: 08/06/2020   ADDENDUM REPORT: 08/06/2020 18:05 ADDENDUM: Upon reviewing the left humeral images,  there appears to be a subtle a fracture through the neck of the left humerus only seen on the true AP view. This finding was described on the humeral images. Electronically Signed   By: Dorise Bullion III M.D   On: 08/06/2020 18:05   Result Date: 08/06/2020 CLINICAL DATA:  Pain after fall EXAM: LEFT SHOULDER - 2+ VIEW COMPARISON:  None. FINDINGS: Irregularity along the inferior aspect of the glenoid suggest the possibility of previous trauma. No acute fractures or dislocations are seen on today's study. Limited views of the chest are normal. IMPRESSION: Negative. Electronically Signed: By: Dorise Bullion III M.D On: 08/06/2020 18:01   DG Humerus Left  Result Date: 08/06/2020 CLINICAL DATA:  Pain after fall EXAM: LEFT HUMERUS - 2+ VIEW COMPARISON:  None. FINDINGS: There is a subtle lucency extending through the neck of the left humerus only seen on the final image. In retrospect, this is also seen on 1 of the images of the left shoulder but is very subtle. No other fractures. IMPRESSION: Subtle nondisplaced fracture through the neck of the humerus only seen on 1 image of the humeral films and 1 image of the left shoulder films. The finding is subtle but appears to be real. Electronically Signed   By: Dorise Bullion III M.D   On: 08/06/2020 18:04   DG Femur Min 2 Views Left  Result Date: 08/06/2020 CLINICAL DATA:  Pain after fall. EXAM: LEFT FEMUR 2 VIEWS COMPARISON:  None. FINDINGS: Only the distal half of the femur was imaged on the lateral view. Only the distal third of the femur was imaged on the AP view. The proximal half of the femur was imaged on the lateral view.  Given the lack of visualization of the entire femur in all three views, evaluation is limited. Within these limitations, no fractures are seen. IMPRESSION: Only a portion of the femur was seen in each of the three views which limits evaluation. Within these limitations, no fractures are seen. Electronically Signed   By: Dorise Bullion III M.D   On: 08/06/2020 18:00      IMPRESSION AND PLAN:   1.  Mechanical fall with subsequent left hip and shoulder fractures. -The patient will be admitted to a medically monitored bed. -Pain management will be provided. -An orthopedic consultation will be obtained. -Dr. Lynann Bologna was notified and is aware about the patient. -He stated Dr. Nyoka Cowden will take the patient to the OR for ORIF of the left hip tomorrow at 4 PM. -We will hold off her aspirin. -He has a history of CVA and diabetes mellitus on insulin with no history of CHF, coronary artery disease, renal failure with creatinine more than 2.  He is definitely above average risk/moderate risk for his age for perioperative cardiovascular events per the revised cardiac risk index.  He has no current pulmonary issues.  2.  Sinus tachycardia likely secondary to volume depletion and dehydration. -The patient will be hydrated with IV normal saline. -Will resume his Cardizem CD. -He will be monitored. -I will add TSH. -Adequate pain management will be provided.  3.  Mild/early DKA, with type 2 diabetes mellitus, as manifested by his hyperglycemia, mild acidosis with CO2 of 19 and his BMP and anion gap of 20. -This should be resolving by now given IV normal saline bolus given. -She will be placed on supplement coverage with resistant NovoLog protocol with aggressive hydration with normal saline. -We will closely monitor his fingerstick blood glucose measures.  4.  Acute  kidney injury. -We will hold his Cozaar and HCTZ -He will be hydrated with IV normal saline. -We will follow his BMP.  5.   Dyslipidemia. -We will continue statin therapy.  6.  Hypertension. -We will continue his amlodipine.  7.  Glaucoma. -His ophthalmic gtt. will be continued.  8.  DVT prophylaxis. -Subcutaneous NovoLog is held off till postoperative. -We will place him for now on SCDs.   All the records are reviewed and case discussed with ED provider. The plan of care was discussed in details with the patient (and family). I answered all questions. The patient agreed to proceed with the above mentioned plan. Further management will depend upon hospital course.   CODE STATUS: Full code  Status is: Inpatient  Remains inpatient appropriate because:Ongoing active pain requiring inpatient pain management, Ongoing diagnostic testing needed not appropriate for outpatient work up, Unsafe d/c plan, IV treatments appropriate due to intensity of illness or inability to take PO and Inpatient level of care appropriate due to severity of illness   Dispo: The patient is from: Home              Anticipated d/c is to: SNF              Anticipated d/c date is: 3 days              Patient currently is not medically stable to d/c.   TOTAL TIME TAKING CARE OF THIS PATIENT: 60 minutes.    Christel Mormon M.D on 08/06/2020 at 8:30 PM  [facility]   From 7 PM-7 AM, contact night-coverage [email or web address]  CC: Primary care physician; Ria Bush, MD   Note: This dictation was prepared with Dragon dictation along with smaller phrase technology. Any transcriptional typo errors that result from this process are unintentional.

## 2020-08-06 NOTE — ED Notes (Signed)
Patient to xray.

## 2020-08-06 NOTE — ED Notes (Signed)
Marcus Downs, [place] aware pt has refused rectal temp due to his hip pain. Pt states his hip is broken and will not allow staff to assist in rolling over in bed.

## 2020-08-06 NOTE — ED Provider Notes (Signed)
Marcus Downs   CSN: [id number] Arrival date & time: 08/06/20  1610     History Chief Complaint  Patient presents with   Fall   Hyperglycemia    Brydan Downard. is a 79 y.o. male history of CVA with residual left-sided weakness, diabetes, hypertension, hyperlipidemia, right bundle branch block, glaucoma.  Patient arrives via EMS from home.  Patient reports 3 days ago he was at home in the laundry room when he slipped on the linoleum, he fell onto his left side.  He had left hip and left arm pain afterwards described as a moderate intensity throbbing ache constant nonradiating worsened with movement no alleviating factors.  He was able to get back up into a wheelchair and refused EMS transport to the hospital at that time.  When patient family came to visit today and he could not move of his wheelchair they called EMS to bring him to the ER.  Patient denies head injury, loss consciousness, blood thinner use, neck pain, back pain, chest pain, abdominal pain, nausea/vomiting,.  Patient does report that he has not been eating or drinking as much since last fall and has not been taking his medication. HPI     Past Medical History:  Diagnosis Date   CRI (chronic renal insufficiency)    CVA (cerebral infarction) 1997   Right with residual LUE weakness   Diabetes mellitus type II 1992   HLD (hyperlipidemia) 10/1998   HTN (hypertension) 10/1998   Primary open angle glaucoma    [name]   RBBB    Stroke [facility])    Left sided weakness    Patient Active Problem List   Diagnosis Date Noted   Closed left hip fracture (HCC) 08/06/2020   Elevated alkaline phosphatase level 11/01/2019   Type 2 diabetes mellitus with vascular disease (HCC) 08/16/2019   Diabetes mellitus with cataract (HCC) 08/05/2019   Weight loss 04/30/2019   Iron deficiency anemia 04/29/2018   Pedal edema 07/11/2017   Primary open angle glaucoma     Advanced Downs planning/counseling discussion 02/20/2015   Benign prostatic hyperplasia 02/20/2015   Falls 11/08/2013   Medicare annual wellness visit, subsequent 05/25/2012   Hemiplegia, 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   Pain due to onychomycosis of toenails of both feet 09/10/2010   Renal insufficiency 04/13/2009   GERD 03/30/2009   Controlled diabetes mellitus type 2 with complications (HCC) 05/04/2007   Hyperlipidemia associated with type 2 diabetes mellitus (HCC) 05/04/2007   Essential hypertension 05/04/2007   SYMPTOM, INCONTINENCE, MIXED, URGE/STRESS 05/04/2007   Ex-smoker 05/04/2007    Past Surgical History:  Procedure Laterality Date   CATARACT EXTRACTION W/PHACO  09/09/2012   Procedure: CATARACT EXTRACTION PHACO AND INTRAOCULAR LENS PLACEMENT (IOC);  Surgeon: [name], MD;  Location: [facility] OR;  Service: Ophthalmology;  Laterality: Right;   CATARACT EXTRACTION W/PHACO Left 03/31/2013   Procedure: CATARACT EXTRACTION PHACO AND INTRAOCULAR LENS PLACEMENT (IOC);  Surgeon: [name], MD;  Location: [facility] OR;  Service: Ophthalmology;  Laterality: Left;   EYE SURGERY     lipoma removal  1970's   Right shoulder   US ECHOCARDIOGRAPHY  03/2013   mod LVH, EF 60%, normal wall motion       Family History  Problem Relation Age of Onset   Heart attack Father 60   Hypertension Sister        Multiple medical problems   Diabetes Sister    Stroke Neg Hx    Cancer  Neg Hx     Social History   Tobacco Use   Smoking status: Former Smoker    Packs/day: 1.00    Years: 30.00    Pack years: 30.00    Types: Cigarettes    Quit date: 09/07/1996    Years since quitting: 23.9   Smokeless tobacco: Never Used   Tobacco comment: quit after CVA  Vaping Use   Vaping Use: Never used  Substance Use Topics   Alcohol use: No   Drug use: No    Home Medications Prior to Admission medications   Medication Sig Start Date End Date Taking?  Authorizing Provider  amLODipine-atorvastatin (CADUET) 10-40 MG tablet TAKE 1 TABLET BY MOUTH EVERY DAY Patient taking differently: Take 1 tablet by mouth daily.  08/02/20  Yes [name], MD  aspirin 325 MG EC tablet Take 325 mg by mouth daily. For stroke prevention     Yes [provider]  brimonidine (ALPHAGAN P) 0.1 % SOLN Place 1 drop into both eyes 3 (three) times daily.   Yes [provider]  diltiazem (CARDIZEM CD) 360 MG 24 hr capsule TAKE 1 CAPSULE BY MOUTH EVERY DAY Patient taking differently: Take 360 mg by mouth daily.  06/19/20  Yes [name], MD  ferrous sulfate 325 (65 FE) MG tablet Take 325 mg by mouth daily.  08/31/18  Yes [name], MD  hydrochlorothiazide (HYDRODIURIL) 25 MG tablet TAKE 1 TABLET BY MOUTH EVERY DAY Patient taking differently: Take 25 mg by mouth daily.  06/15/20  Yes [name], MD  insulin NPH-regular Human (70-30) 100 UNIT/ML injection Inject 22-25 Units into the skin See admin instructions. Inject 25 units subcutaneously with breakfast and 22 units with supper   Yes [provider]  JARDIANCE 10 MG TABS tablet TAKE 1 TABLET BY MOUTH EVERY DAY Patient taking differently: Take 10 mg by mouth daily before supper.  06/15/20  Yes [name], MD  latanoprost (XALATAN) 0.005 % ophthalmic solution Place 1 drop into both eyes at bedtime.   Yes [provider]  losartan (COZAAR) 100 MG tablet TAKE 1 TABLET BY MOUTH EVERY DAY Patient taking differently: Take 100 mg by mouth daily.  06/12/20  Yes [name], MD  metFORMIN (GLUCOPHAGE-XR) 500 MG 24 hr tablet TAKE 1 TABLET BY MOUTH EVERY DAY WITH BREAKFAST Patient taking differently: Take 500 mg by mouth daily.  06/12/20  Yes [name], MD  naproxen sodium (ALEVE) 220 MG tablet Take 220 mg by mouth 2 (two) times daily as needed (pain).   Yes [provider]  potassium chloride (KLOR-CON) 10 MEQ tablet TAKE 3 TABLETS (30 MEQ TOTAL) BY  MOUTH DAILY. 06/12/20  Yes [name], MD  brimonidine (ALPHAGAN P) 0.15 % ophthalmic solution Place 1 drop into both eyes 3 (three) times daily. Patient not taking: Reported on 08/06/2020 08/05/19   [name], MD  diclofenac sodium (VOLTAREN) 1 % GEL Apply 1 application topically 3 (three) times daily as needed. Patient not taking: Reported on 08/06/2020 04/29/18   [name], MD  glucose blood [facility] BLOOD GLUCOSE TEST) test strip Check three times daily and when feeling ill E11.8 insulin dependent 12/12/17   [name], MD  insulin aspart protamine- aspart (NOVOLOG MIX 70/30) (70-30) 100 UNIT/ML injection Take 25 units with breakfast and 22 units with supper. QS 1 month Patient not taking: Reported on 08/06/2020 04/29/18   [name], MD  Latanoprostene Bunod (VYZULTA) 0.024 % SOLN Apply 1 drop to eye at bedtime. Patient not  taking: Reported on 08/06/2020 08/05/19   Eustaquio BoydenGutierrez, Javier, MD    Allergies    Tape  Review of Systems   Review of Systems Ten systems are reviewed and are negative for acute change except as noted in the HPI  Physical Exam Updated Vital Signs BP 140/86    Pulse (!) 124    Temp 99.1 F (37.3 C) (Oral)    Resp 20    Ht 5' 11.5" (1.816 m)    Wt 87.5 kg    SpO2 98%    BMI 26.54 kg/m   Physical Exam Constitutional:      General: He is not in acute distress.    Appearance: Normal appearance. He is well-developed. He is not ill-appearing or diaphoretic.  HENT:     Head: Normocephalic and atraumatic.  Eyes:     General: Vision grossly intact. Gaze aligned appropriately.     Pupils: Pupils are equal, round, and reactive to light.  Neck:     Trachea: Trachea and phonation normal.  Pulmonary:     Effort: Pulmonary effort is normal. No respiratory distress.  Abdominal:     General: There is no distension.     Palpations: Abdomen is soft.     Tenderness: There is no abdominal tenderness. There is no guarding or rebound.    Musculoskeletal:        General: Normal range of motion.     Cervical back: Normal range of motion.     Comments: No midline C/T/L spinal tenderness to palpation, no paraspinal muscle tenderness, no deformity, crepitus, or step-off noted. No sign of injury to the neck or back.  Tenderness of the left hip, shortening of the left leg with external rotation.  Capillary refill and sensation intact to the toes, equal pedal pulses, compartments soft.  Patient has sling on his left arm, some tenderness about the shoulder and elbow, range of motion deferred secondary to pain, strong equal radial pulses, sensation intact in fingers, patient has chronic left upper extremity weakness secondary to stroke.  Skin:    General: Skin is warm and dry.  Neurological:     Mental Status: He is alert.     GCS: GCS eye subscore is 4. GCS verbal subscore is 5. GCS motor subscore is 6.     Comments: Speech is clear and goal oriented, follows commands Major Cranial nerves without deficit, no facial droop Moves extremities without ataxia, coordination intact  Psychiatric:        Behavior: Behavior normal.    ED Results / Procedures / Treatments   Labs (all labs ordered are listed, but only abnormal results are displayed) Labs Reviewed  CBC WITH DIFFERENTIAL/PLATELET - Abnormal; Notable for the following components:      Result Value   WBC 12.4 (*)    RBC 5.83 (*)    MCV 74.8 (*)    MCH 25.6 (*)    RDW 20.9 (*)    Neutro Abs 10.3 (*)    All other components within normal limits  BASIC METABOLIC PANEL - Abnormal; Notable for the following components:   CO2 19 (*)    Glucose, Bld 469 (*)    BUN 49 (*)    Creatinine, Ser 1.74 (*)    GFR calc non Af Amer 37 (*)    GFR calc Af Amer 43 (*)    Anion gap 20 (*)    All other components within normal limits  HEPATIC FUNCTION PANEL - Abnormal; Notable for the following components:  Total Protein 8.7 (*)    AST 62 (*)    Alkaline Phosphatase 172 (*)    Total  Bilirubin 1.5 (*)    Bilirubin, Direct 0.3 (*)    Indirect Bilirubin 1.2 (*)    All other components within normal limits  URINALYSIS, ROUTINE W REFLEX MICROSCOPIC - Abnormal; Notable for the following components:   Glucose, UA >=500 (*)    Hgb urine dipstick SMALL (*)    Ketones, ur 20 (*)    All other components within normal limits  LACTIC ACID, PLASMA - Abnormal; Notable for the following components:   Lactic Acid, Venous 2.5 (*)    All other components within normal limits  BLOOD GAS, VENOUS - Abnormal; Notable for the following components:   pCO2, Ven 39.6 (*)    Acid-base deficit 4.5 (*)    All other components within normal limits  CBG MONITORING, ED - Abnormal; Notable for the following components:   Glucose-Capillary 418 (*)    All other components within normal limits  I-STAT CHEM 8, ED - Abnormal; Notable for the following components:   BUN 45 (*)    Creatinine, Ser 1.60 (*)    Glucose, Bld 460 (*)    TCO2 20 (*)    All other components within normal limits  SARS CORONAVIRUS 2 BY RT PCR (HOSPITAL ORDER, PERFORMED IN [facility])  LIPASE, BLOOD  ETHANOL  RAPID URINE DRUG SCREEN, HOSP PERFORMED  LACTIC ACID, PLASMA  BASIC METABOLIC PANEL  CBC  CBG MONITORING, ED    EKG EKG Interpretation  Date/Time:  Sunday August 06 2020 18:01:11 EDT Ventricular Rate:  128 PR Interval:    QRS Duration: 145 QT Interval:  367 QTC Calculation: 536 R Axis:   -73 Text Interpretation: Sinus or ectopic atrial tachycardia Multiple ventricular premature complexes Right bundle branch block Inferior infarct, old Lateral leads are also involved No significant change since last tracing Confirmed by Gwyneth Sprout ([id number]) on 08/06/2020 6:43:29 PM   Radiology DG Chest 1 View  Result Date: 08/06/2020 CLINICAL DATA:  Left leg pain related to fall. EXAM: CHEST  1 VIEW COMPARISON:  October 30, 2013 FINDINGS: The heart, hila, and mediastinum are normal. No pneumothorax.  Minimal atelectasis in the left base. The lungs are otherwise clear. No other acute abnormalities. IMPRESSION: No active disease. Electronically Signed   By: Gerome Sam III M.D   On: 08/06/2020 18:08   DG Pelvis 1-2 Views  Result Date: 08/06/2020 CLINICAL DATA:  Pain after fall EXAM: PELVIS - 1-2 VIEW COMPARISON:  None. FINDINGS: A subcapital fracture seen to the left hip. Evaluation for dislocation is limited as only an AP view was obtained. No definite dislocation noted. No other bony abnormalities are identified. IMPRESSION: Subcapital fracture through the proximal left hip. Evaluation for dislocation is limited without a lateral view but there is no evidence of dislocation on this study. Electronically Signed   By: Gerome Sam III M.D   On: 08/06/2020 18:09   DG Elbow Complete Left  Result Date: 08/06/2020 CLINICAL DATA:  Pain after fall EXAM: LEFT ELBOW - COMPLETE 3+ VIEW COMPARISON:  None. FINDINGS: Evaluation is limited due to positioning. The proximal ulna and radius are not well visualized on the first 3 images as a overlap the humerus. No joint effusion is seen on the lateral view. Mild irregularity of the radial head on the lateral view suggest the possibility of a subtle fracture. No other abnormalities. IMPRESSION: Evaluation is limited due to positioning  as above. Irregularity of the radial head on the lateral view is concerning for a subtle fracture. No joint effusion identified. Electronically Signed   By: Gerome Sam III M.D   On: 08/06/2020 18:07   CT Head Wo Contrast  Result Date: 08/06/2020 CLINICAL DATA:  Status post fall. EXAM: CT HEAD WITHOUT CONTRAST TECHNIQUE: Contiguous axial images were obtained from the base of the skull through the vertex without intravenous contrast. COMPARISON:  October 30, 2013 FINDINGS: Brain: There is mild to moderate severity cerebral atrophy with widening of the extra-axial spaces and ventricular dilatation. There are areas of decreased  attenuation within the white matter tracts of the supratentorial brain, consistent with microvascular disease changes. A large area of cortical encephalomalacia, with adjacent chronic white matter low attenuation, is seen throughout the right hemisphere. This involves predominantly the right parietal lobe and is unchanged in appearance when compared to the prior study. Vascular: No hyperdense vessel or unexpected calcification. Skull: Normal. Negative for fracture or focal lesion. Sinuses/Orbits: No acute finding. Other: None. IMPRESSION: 1. No acute intracranial abnormality. 2. Generalized cerebral atrophy. 3. Chronic predominant right parietal lobe infarct. Electronically Signed   By: Aram Candela M.D.   On: 08/06/2020 19:00   CT Cervical Spine Wo Contrast  Result Date: 08/06/2020 CLINICAL DATA:  Left leg pain related to fall EXAM: CT CERVICAL SPINE WITHOUT CONTRAST TECHNIQUE: Multidetector CT imaging of the cervical spine was performed without intravenous contrast. Multiplanar CT image reconstructions were also generated. COMPARISON:  None. FINDINGS: Alignment: There is straightening of the normal cervical lordosis. Skull base and vertebrae: Visualized skull base is intact. No atlanto-occipital dissociation. The vertebral body heights are well maintained. No fracture or pathologic osseous lesion seen. Soft tissues and spinal canal: The visualized paraspinal soft tissues are unremarkable. No prevertebral soft tissue swelling is seen. The spinal canal is grossly unremarkable, no large epidural collection or significant canal narrowing. Disc levels: Multilevel cervical spine spondylosis is seen with anterior osteophytes, disc osteophyte complex and uncovertebral osteophytes most notable C5-C6 and C6-C7 with moderate neural foraminal narrowing and mild central canal stenosis. Upper chest: The lung apices are clear. Thoracic inlet is within normal limits. Other: None IMPRESSION: No acute fracture or  malalignment of the spine. 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 Electronically Signed   By: Jonna Clark M.D.   On: 08/06/2020 19:25   DG Shoulder Left  Addendum Date: 08/06/2020   ADDENDUM REPORT: 08/06/2020 18:05 ADDENDUM: Upon reviewing the left humeral images, there appears to be a subtle a fracture through the neck of the left humerus only seen on the true AP view. This finding was described on the humeral images. Electronically Signed   By: Gerome Sam III M.D   On: 08/06/2020 18:05   Result Date: 08/06/2020 CLINICAL DATA:  Pain after fall EXAM: LEFT SHOULDER - 2+ VIEW COMPARISON:  None. FINDINGS: Irregularity along the inferior aspect of the glenoid suggest the possibility of previous trauma. No acute fractures or dislocations are seen on today's study. Limited views of the chest are normal. IMPRESSION: Negative. Electronically Signed: By: Gerome Sam III M.D On: 08/06/2020 18:01   DG Humerus Left  Result Date: 08/06/2020 CLINICAL DATA:  Pain after fall EXAM: LEFT HUMERUS - 2+ VIEW COMPARISON:  None. FINDINGS: There is a subtle lucency extending through the neck of the left humerus only seen on the final image. In retrospect, this is also seen on 1 of the images of the left shoulder but is  very subtle. No other fractures. IMPRESSION: Subtle nondisplaced fracture through the neck of the humerus only seen on 1 image of the humeral films and 1 image of the left shoulder films. The finding is subtle but appears to be real. Electronically Signed   By: Gerome Sam III M.D   On: 08/06/2020 18:04   DG Femur Min 2 Views Left  Result Date: 08/06/2020 CLINICAL DATA:  Pain after fall. EXAM: LEFT FEMUR 2 VIEWS COMPARISON:  None. FINDINGS: Only the distal half of the femur was imaged on the lateral view. Only the distal third of the femur was imaged on the AP view. The proximal half of the femur was imaged on the lateral view. Given the lack of visualization of the entire  femur in all three views, evaluation is limited. Within these limitations, no fractures are seen. IMPRESSION: Only a portion of the femur was seen in each of the three views which limits evaluation. Within these limitations, no fractures are seen. Electronically Signed   By: Gerome Sam III M.D   On: 08/06/2020 18:00    Procedures Procedures (including critical Downs time)  Medications Ordered in ED Medications  diltiazem (CARDIZEM CD) 24 hr capsule 300 mg (has no administration in time range)  insulin aspart protamine- aspart (NOVOLOG MIX 70/30) injection 25 Units (has no administration in time range)  ferrous sulfate tablet 325 mg (has no administration in time range)  potassium chloride (KLOR-CON) CR tablet 30 mEq (has no administration in time range)  latanoprost (XALATAN) 0.005 % ophthalmic solution 1 drop (has no administration in time range)  0.9 %  sodium chloride infusion (has no administration in time range)  acetaminophen (TYLENOL) tablet 650 mg (has no administration in time range)    Or  acetaminophen (TYLENOL) suppository 650 mg (has no administration in time range)  morphine 2 MG/ML injection 2 mg (has no administration in time range)  traZODone (DESYREL) tablet 25 mg (has no administration in time range)  magnesium hydroxide (MILK OF MAGNESIA) suspension 30 mL (has no administration in time range)  ondansetron (ZOFRAN) tablet 4 mg (has no administration in time range)    Or  ondansetron (ZOFRAN) injection 4 mg (has no administration in time range)  amLODipine (NORVASC) tablet 10 mg (has no administration in time range)    And  atorvastatin (LIPITOR) tablet 40 mg (has no administration in time range)  sodium chloride 0.9 % bolus 1,000 mL (0 mLs Intravenous Stopped 08/06/20 1907)  0.9 %  sodium chloride infusion ( Intravenous New Bag/Given 08/06/20 1907)  sodium chloride 0.9 % bolus 500 mL (500 mLs Intravenous Bolus from Bag 08/06/20 2015)    ED Course  I have reviewed  the triage vital signs and the nursing notes.  Pertinent labs & imaging results that were available during my Downs of the patient were reviewed by me and considered in my medical decision making (see chart for details).  Clinical Course as of Aug 06 2114  Wynelle Link Aug 06, 2020  [address], Nafziger (Sister) [phone] [facility] Phone): No answer   [BM]  2004 Marcus Downs   [BM]  2012 Marcus Downs   [BM]    Clinical Course User Index [BM] Elizabeth Palau   MDM Rules/Calculators/A&P                         Additional history obtained from: 1. Nursing notes from this visit. 2. Family, patient's son at bedside. 3. Electronic medical record. -------------------------------------  79 year old male arrived with left hip and left arm pain after fall that occurred 3 days ago.  He lives alone but was helped back into his wheelchair and has been moving around his house last 3 days.  He was evaluated on the day of his fall by EMS and refused transport to the hospital.  When family visited him again today he allowed EMS to be called and came to this ER for evaluation.  Aside from the left hip and left arm pain he has no other complaints, he does report that he has been eating and drinking less over the last 3 days.  He denies any fever or infectious type symptoms.  On exam there is noticeable shortening and external rotation of the left leg, some tenderness of the left shoulder and left elbow.  Patient has left-sided weakness from old stroke so examination is limited to those areas, no obvious deformity of the arm.  There is no evidence of significant injury to the head neck back chest or abdomen.  He is tachycardic but not febrile or tachypneic SPO2 99% on room air.  Does not meet SIRS/sepsis criteria, possible tachycardia is from dehydration, will obtain labs and imaging.  Patient noted to be hypoglycemic by EMS, will obtain i-STAT Chem-8 and a VBG, empiric fluid bolus given for possible  DKA. ------------------------------ I ordered, reviewed and interpreted labs which include: VBG without acidosis Lactic elevated 2.5 Covid negative Lipase within normal limits LFTs mildly elevated, patient without abdominal pain. BMP shows AKI with creatinine 1.74, glucose elevated at 469, gap of 20, bicarb 19. UDS negative. CBC with leukocytosis of 12.4, patient without infectious symptoms, suspect secondary to injury.  No anemia. Urinalysis shows 20 ketones, no evidence of infection.  EKG: Sinus or ectopic atrial tachycardia Multiple ventricular premature complexes Right bundle branch block Inferior infarct, old Lateral leads are also involved No significant change since last tracing Confirmed by Gwyneth Sprout ([id number]) on 08/06/2020 6:43:29 PM  CT Head:    IMPRESSION:  1. No acute intracranial abnormality.  2. Generalized cerebral atrophy.  3. Chronic predominant right parietal lobe infarct.   CT CSpine:  IMPRESSION:  No acute fracture or malalignment of the spine.    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   CXR:  IMPRESSION:  No active disease.   DG Pelvis:  IMPRESSION:  Subcapital fracture through the proximal left hip. Evaluation for  dislocation is limited without a lateral view but there is no  evidence of dislocation on this study.   DG Left Elbow:  IMPRESSION:  Evaluation is limited due to positioning as above. Irregularity of  the radial head on the lateral view is concerning for a subtle  fracture. No joint effusion identified.   DG Left Humerus:  IMPRESSION:  Subtle nondisplaced fracture through the neck of the humerus only  seen on 1 image of the humeral films and 1 image of the left  shoulder films. The finding is subtle but appears to be real.   DG Left Shoulder: ADDENDUM: Upon reviewing the left humeral images, there appears to be a subtle a fracture through the neck of the left humerus only seen on the true AP view. This  finding was described on the humeral images.  DG Left Femur:  IMPRESSION:  Only a portion of the femur was seen in each of the three views  which limits evaluation. Within these limitations, no fractures are  seen.  - Discussed case with Marcus Downs, advises hospitalist  admission to [facility].  Marcus Downs speaking with colleagues about possible repairs tomorrow. - Patient will need medical admission for tachycardia, AKI, hyperglycemia.  Possibly mild early DKA.  Patient was given fluid bolus.  Discussed case with Marcus Downs, patient accepted to hospitalist service. - Spoke with Marcus Downs again over the phone, advises Dr. Chilton Si will be repairing the hip tomorrow around 4 PM. - Patient reevaluated multiple times during this visit remained stable on room air.  Patient son at bedside.  They are agreeable for admission.  Downs: Portions of this report may have been transcribed using voice recognition software. Every effort was made to ensure accuracy; however, inadvertent computerized transcription errors may still be present. Final Clinical Impression(s) / ED Diagnoses Final diagnoses:  Hyperglycemia  AKI (acute kidney injury) (HCC)  Closed fracture of left hip, initial encounter (HCC)  Closed fracture of left elbow, initial encounter  Unspecified nondisplaced fracture of surgical neck of left humerus, initial encounter for closed fracture    Rx / DC Orders ED Discharge Orders    None       Elizabeth Palau 08/06/20 2116    Gwyneth Sprout, MD 08/09/20 [phone]

## 2020-08-06 NOTE — Progress Notes (Signed)
I was contacted regarding patient's left hip fracture and left elbow fracture. Left elbow can likely be treated nonoperatively. Left hip will certainly need operative intervention. I have reached out to Dr. Luiz Blare, who will evaluate patient tomorrow and will be able to proceed with surgery tomorrow at approximately 4pm, as long as the medical team feels it is safe to proceed with surgery from a medical standpoint. Please ensure patient is NPO after midnight tonight for anticipated surgery tomorrow.

## 2020-08-07 ENCOUNTER — Inpatient Hospital Stay (HOSPITAL_COMMUNITY): Payer: Medicare Other | Admitting: Anesthesiology

## 2020-08-07 ENCOUNTER — Inpatient Hospital Stay (HOSPITAL_COMMUNITY): Payer: Medicare Other

## 2020-08-07 ENCOUNTER — Encounter (HOSPITAL_COMMUNITY): Payer: Self-pay | Admitting: Family Medicine

## 2020-08-07 ENCOUNTER — Encounter (HOSPITAL_COMMUNITY)
Admission: EM | Disposition: A | Discharge status: Skilled Nursing Facility | Payer: Self-pay | Source: Home / Self Care | Attending: Family Medicine

## 2020-08-07 DIAGNOSIS — I639 Cerebral infarction, unspecified: Secondary | ICD-10-CM

## 2020-08-07 DIAGNOSIS — Z96649 Presence of unspecified artificial hip joint: Secondary | ICD-10-CM

## 2020-08-07 DIAGNOSIS — L8990 Pressure ulcer of unspecified site, unspecified stage: Secondary | ICD-10-CM | POA: Insufficient documentation

## 2020-08-07 DIAGNOSIS — S52102A Unspecified fracture of upper end of left radius, initial encounter for closed fracture: Secondary | ICD-10-CM

## 2020-08-07 DIAGNOSIS — N179 Acute kidney failure, unspecified: Secondary | ICD-10-CM

## 2020-08-07 HISTORY — PX: HIP ARTHROPLASTY: SHX981

## 2020-08-07 LAB — CBC
HCT: 38.9 % — ABNORMAL LOW (ref 39.0–52.0)
Hemoglobin: 13.2 g/dL (ref 13.0–17.0)
MCH: 25.5 pg — ABNORMAL LOW (ref 26.0–34.0)
MCHC: 33.9 g/dL (ref 30.0–36.0)
MCV: 75.1 fL — ABNORMAL LOW (ref 80.0–100.0)
Platelets: 203 10*3/uL (ref 150–400)
RBC: 5.18 MIL/uL (ref 4.22–5.81)
RDW: 20.1 % — ABNORMAL HIGH (ref 11.5–15.5)
WBC: 11.2 10*3/uL — ABNORMAL HIGH (ref 4.0–10.5)
nRBC: 0 % (ref 0.0–0.2)

## 2020-08-07 LAB — BASIC METABOLIC PANEL
Anion gap: 16 — ABNORMAL HIGH (ref 5–15)
Anion gap: 16 — ABNORMAL HIGH (ref 5–15)
Anion gap: 19 — ABNORMAL HIGH (ref 5–15)
BUN: 36 mg/dL — ABNORMAL HIGH (ref 8–23)
BUN: 31 mg/dL — ABNORMAL HIGH (ref 8–23)
BUN: 28 mg/dL — ABNORMAL HIGH (ref 8–23)
CO2: 24 mmol/L (ref 22–32)
CO2: 22 mmol/L (ref 22–32)
CO2: 25 mmol/L (ref 22–32)
Calcium: 8.9 mg/dL (ref 8.9–10.3)
Calcium: 8.3 mg/dL — ABNORMAL LOW (ref 8.9–10.3)
Calcium: 8.9 mg/dL (ref 8.9–10.3)
Chloride: 114 mmol/L — ABNORMAL HIGH (ref 98–111)
Chloride: 112 mmol/L — ABNORMAL HIGH (ref 98–111)
Chloride: 109 mmol/L (ref 98–111)
Creatinine, Ser: 1.46 mg/dL — ABNORMAL HIGH (ref 0.61–1.24)
Creatinine, Ser: 1.22 mg/dL (ref 0.61–1.24)
Creatinine, Ser: 1.26 mg/dL — ABNORMAL HIGH (ref 0.61–1.24)
GFR calc Af Amer: 53 mL/min — ABNORMAL LOW (ref >60)
GFR calc Af Amer: >60 mL/min (ref >60)
GFR calc Af Amer: >60 mL/min (ref >60)
GFR calc non Af Amer: 45 mL/min — ABNORMAL LOW (ref >60)
GFR calc non Af Amer: 56 mL/min — ABNORMAL LOW (ref >60)
GFR calc non Af Amer: 54 mL/min — ABNORMAL LOW (ref >60)
Glucose, Bld: 186 mg/dL — ABNORMAL HIGH (ref 70–99)
Glucose, Bld: 183 mg/dL — ABNORMAL HIGH (ref 70–99)
Glucose, Bld: 108 mg/dL — ABNORMAL HIGH (ref 70–99)
Potassium: 3.9 mmol/L (ref 3.5–5.1)
Potassium: 3.7 mmol/L (ref 3.5–5.1)
Potassium: 4.5 mmol/L (ref 3.5–5.1)
Sodium: 154 mmol/L — ABNORMAL HIGH (ref 135–145)
Sodium: 150 mmol/L — ABNORMAL HIGH (ref 135–145)
Sodium: 153 mmol/L — ABNORMAL HIGH (ref 135–145)

## 2020-08-07 LAB — LACTIC ACID, PLASMA: Lactic Acid, Venous: 1.8 mmol/L (ref 0.5–1.9)

## 2020-08-07 LAB — TYPE AND SCREEN
ABO/RH(D): A NEG
Antibody Screen: NEGATIVE

## 2020-08-07 LAB — ABO/RH: ABO/RH(D): A NEG

## 2020-08-07 LAB — HEMOGLOBIN A1C
Hgb A1c MFr Bld: 7.7 % — ABNORMAL HIGH (ref 4.8–5.6)
Mean Plasma Glucose: 174.29 mg/dL

## 2020-08-07 LAB — GLUCOSE, CAPILLARY
Glucose-Capillary: 94 mg/dL (ref 70–99)
Glucose-Capillary: 114 mg/dL — ABNORMAL HIGH (ref 70–99)
Glucose-Capillary: 205 mg/dL — ABNORMAL HIGH (ref 70–99)

## 2020-08-07 LAB — TSH: TSH: 1.369 u[IU]/mL (ref 0.350–4.500)

## 2020-08-07 LAB — MRSA PCR SCREENING: MRSA by PCR: NEGATIVE

## 2020-08-07 LAB — CBG MONITORING, ED
Glucose-Capillary: 177 mg/dL — ABNORMAL HIGH (ref 70–99)
Glucose-Capillary: 175 mg/dL — ABNORMAL HIGH (ref 70–99)

## 2020-08-07 LAB — TROPONIN I (HIGH SENSITIVITY): Troponin I (High Sensitivity): 38 ng/L — ABNORMAL HIGH (ref <18)

## 2020-08-07 LAB — MAGNESIUM: Magnesium: 2.6 mg/dL — ABNORMAL HIGH (ref 1.7–2.4)

## 2020-08-07 SURGERY — HEMIARTHROPLASTY, HIP, DIRECT ANTERIOR APPROACH, FOR FRACTURE
Anesthesia: General | Site: Hip | Laterality: Left

## 2020-08-07 MED ORDER — DEXTROSE-NACL 5-0.45 % IV SOLN: INTRAVENOUS | Status: DC

## 2020-08-07 MED ORDER — OXYCODONE HCL 5 MG PO TABS
5.0000 mg | ORAL_TABLET | ORAL | Status: DC | PRN
  Administered 2020-08-08: 10 mg via ORAL
  Administered 2020-08-09 – 2020-08-12 (×2): 5 mg via ORAL
  Administered 2020-08-13: 10 mg via ORAL
  Administered 2020-08-13: 5 mg via ORAL
  Administered 2020-08-14 (×2): 10 mg via ORAL
  Filled 2020-08-07 (×2): qty 1
  Filled 2020-08-07 (×6): qty 2

## 2020-08-07 MED ORDER — POVIDONE-IODINE 10 % EX SWAB
2.0000 "application " | Freq: Once | CUTANEOUS | Status: AC
  Administered 2020-08-07: 2 via TOPICAL

## 2020-08-07 MED ORDER — DILTIAZEM HCL 25 MG/5ML IV SOLN
5.0000 mg | Freq: Once | INTRAVENOUS | Status: AC
  Filled 2020-08-07: qty 5

## 2020-08-07 MED ORDER — ASPIRIN 325 MG PO TABS: 325.0000 mg | ORAL_TABLET | Freq: Every day | ORAL | Status: DC

## 2020-08-07 MED ORDER — CEFAZOLIN SODIUM-DEXTROSE 2-4 GM/100ML-% IV SOLN
INTRAVENOUS | Status: AC
  Filled 2020-08-07: qty 100

## 2020-08-07 MED ORDER — METOPROLOL TARTRATE 5 MG/5ML IV SOLN: 5.0000 mg | Freq: Once | INTRAVENOUS | Status: AC

## 2020-08-07 MED ORDER — CEFAZOLIN SODIUM-DEXTROSE 2-4 GM/100ML-% IV SOLN
2.0000 g | INTRAVENOUS | Status: AC
  Administered 2020-08-07: 2 g via INTRAVENOUS

## 2020-08-07 MED ORDER — PROPOFOL 10 MG/ML IV BOLUS
INTRAVENOUS | Status: AC
  Filled 2020-08-07: qty 20

## 2020-08-07 MED ORDER — METOPROLOL TARTRATE 5 MG/5ML IV SOLN
INTRAVENOUS | Status: AC
  Administered 2020-08-07: 5 mg via INTRAVENOUS
  Filled 2020-08-07: qty 5

## 2020-08-07 MED ORDER — FENTANYL CITRATE (PF) 100 MCG/2ML IJ SOLN
INTRAMUSCULAR | Status: AC
  Filled 2020-08-07: qty 2

## 2020-08-07 MED ORDER — DILTIAZEM HCL 30 MG PO TABS
30.0000 mg | ORAL_TABLET | Freq: Once | ORAL | Status: AC
  Administered 2020-08-07: 30 mg via ORAL
  Filled 2020-08-07: qty 1

## 2020-08-07 MED ORDER — CEFAZOLIN SODIUM-DEXTROSE 2-4 GM/100ML-% IV SOLN
2.0000 g | Freq: Four times a day (QID) | INTRAVENOUS | Status: AC
  Administered 2020-08-07 – 2020-08-08 (×2): 2 g via INTRAVENOUS
  Filled 2020-08-07 (×2): qty 100

## 2020-08-07 MED ORDER — LIDOCAINE 2% (20 MG/ML) 5 ML SYRINGE
INTRAMUSCULAR | Status: DC | PRN
  Administered 2020-08-07: 100 mg via INTRAVENOUS

## 2020-08-07 MED ORDER — LACTATED RINGERS IV BOLUS
1000.0000 mL | Freq: Once | INTRAVENOUS | Status: AC
  Administered 2020-08-07: 1000 mL via INTRAVENOUS

## 2020-08-07 MED ORDER — ACETAMINOPHEN 10 MG/ML IV SOLN
INTRAVENOUS | Status: AC
  Filled 2020-08-07: qty 100

## 2020-08-07 MED ORDER — ACETAMINOPHEN 10 MG/ML IV SOLN
INTRAVENOUS | Status: DC | PRN
  Administered 2020-08-07: 1000 mg via INTRAVENOUS

## 2020-08-07 MED ORDER — CHLORHEXIDINE GLUCONATE 0.12 % MT SOLN
15.0000 mL | OROMUCOSAL | Status: AC
  Administered 2020-08-07: 15 mL via OROMUCOSAL

## 2020-08-07 MED ORDER — PHENYLEPHRINE HCL-NACL 10-0.9 MG/250ML-% IV SOLN
INTRAVENOUS | Status: DC | PRN
  Administered 2020-08-07: 125 ug/min via INTRAVENOUS

## 2020-08-07 MED ORDER — DILTIAZEM HCL 25 MG/5ML IV SOLN
5.0000 mg | INTRAVENOUS | Status: DC | PRN
  Administered 2020-08-07 (×3): 5 mg via INTRAVENOUS

## 2020-08-07 MED ORDER — ONDANSETRON HCL 4 MG PO TABS
4.0000 mg | ORAL_TABLET | Freq: Four times a day (QID) | ORAL | Status: DC | PRN
  Filled 2020-08-07: qty 1

## 2020-08-07 MED ORDER — ESMOLOL HCL 100 MG/10ML IV SOLN
INTRAVENOUS | Status: DC | PRN
  Administered 2020-08-07: 20 mg via INTRAVENOUS

## 2020-08-07 MED ORDER — METOCLOPRAMIDE HCL 5 MG PO TABS: 5.0000 mg | ORAL_TABLET | Freq: Three times a day (TID) | ORAL | Status: DC | PRN

## 2020-08-07 MED ORDER — VASOPRESSIN 20 UNIT/ML IV SOLN
INTRAVENOUS | Status: DC | PRN
  Administered 2020-08-07: 2 [IU] via INTRAVENOUS
  Administered 2020-08-07: 1 [IU] via INTRAVENOUS
  Administered 2020-08-07: 3 [IU] via INTRAVENOUS
  Administered 2020-08-07: 1 [IU] via INTRAVENOUS

## 2020-08-07 MED ORDER — ALBUMIN HUMAN 5 % IV SOLN
INTRAVENOUS | Status: AC
  Filled 2020-08-07: qty 250

## 2020-08-07 MED ORDER — LACTATED RINGERS IV SOLN
INTRAVENOUS | Status: DC
  Administered 2020-08-07: 1000 mL via INTRAVENOUS

## 2020-08-07 MED ORDER — DILTIAZEM HCL ER COATED BEADS 180 MG PO CP24
360.0000 mg | ORAL_CAPSULE | Freq: Every day | ORAL | Status: DC
  Filled 2020-08-07: qty 2

## 2020-08-07 MED ORDER — METOPROLOL TARTRATE 5 MG/5ML IV SOLN
5.0000 mg | INTRAVENOUS | Status: AC | PRN
  Administered 2020-08-07 (×2): 5 mg via INTRAVENOUS
  Filled 2020-08-07: qty 5

## 2020-08-07 MED ORDER — PHENYLEPHRINE 40 MCG/ML (10ML) SYRINGE FOR IV PUSH (FOR BLOOD PRESSURE SUPPORT)
PREFILLED_SYRINGE | INTRAVENOUS | Status: DC | PRN
  Administered 2020-08-07 (×3): 120 ug via INTRAVENOUS
  Administered 2020-08-07: 40 ug via INTRAVENOUS

## 2020-08-07 MED ORDER — CHLORHEXIDINE GLUCONATE CLOTH 2 % EX PADS
6.0000 | MEDICATED_PAD | Freq: Every day | CUTANEOUS | Status: DC
  Administered 2020-08-07 – 2020-08-09 (×3): 6 via TOPICAL

## 2020-08-07 MED ORDER — OXYCODONE HCL 5 MG PO TABS: 5.0000 mg | ORAL_TABLET | Freq: Once | ORAL | Status: DC | PRN

## 2020-08-07 MED ORDER — METOCLOPRAMIDE HCL 5 MG/ML IJ SOLN: 5.0000 mg | Freq: Three times a day (TID) | INTRAMUSCULAR | Status: DC | PRN

## 2020-08-07 MED ORDER — HYDROMORPHONE HCL 1 MG/ML IJ SOLN
0.5000 mg | INTRAMUSCULAR | Status: DC | PRN
  Administered 2020-08-09: 1 mg via INTRAVENOUS
  Filled 2020-08-07: qty 1

## 2020-08-07 MED ORDER — PROPOFOL 10 MG/ML IV BOLUS
INTRAVENOUS | Status: DC | PRN
  Administered 2020-08-07: 140 mg via INTRAVENOUS

## 2020-08-07 MED ORDER — ALUM & MAG HYDROXIDE-SIMETH 200-200-20 MG/5ML PO SUSP: 30.0000 mL | ORAL | Status: DC | PRN

## 2020-08-07 MED ORDER — OXYCODONE HCL 5 MG/5ML PO SOLN: 5.0000 mg | Freq: Once | ORAL | Status: DC | PRN

## 2020-08-07 MED ORDER — VASOPRESSIN 20 UNIT/ML IV SOLN
INTRAVENOUS | Status: AC
  Filled 2020-08-07: qty 1

## 2020-08-07 MED ORDER — TRANEXAMIC ACID-NACL 1000-0.7 MG/100ML-% IV SOLN
1000.0000 mg | INTRAVENOUS | Status: AC
  Administered 2020-08-07: 1000 mg via INTRAVENOUS

## 2020-08-07 MED ORDER — ALBUMIN HUMAN 5 % IV SOLN: INTRAVENOUS | Status: DC | PRN

## 2020-08-07 MED ORDER — FENTANYL CITRATE (PF) 100 MCG/2ML IJ SOLN: 25.0000 ug | INTRAMUSCULAR | Status: DC | PRN

## 2020-08-07 MED ORDER — ASPIRIN EC 325 MG PO TBEC
325.0000 mg | DELAYED_RELEASE_TABLET | Freq: Two times a day (BID) | ORAL | Status: DC
  Administered 2020-08-08 – 2020-08-14 (×13): 325 mg via ORAL
  Filled 2020-08-07 (×14): qty 1

## 2020-08-07 MED ORDER — METHOCARBAMOL 500 MG PO TABS: 500.0000 mg | ORAL_TABLET | Freq: Four times a day (QID) | ORAL | Status: DC | PRN

## 2020-08-07 MED ORDER — METHOCARBAMOL 1000 MG/10ML IJ SOLN
500.0000 mg | Freq: Four times a day (QID) | INTRAVENOUS | Status: DC | PRN
  Filled 2020-08-07: qty 5

## 2020-08-07 MED ORDER — DILTIAZEM HCL 25 MG/5ML IV SOLN
INTRAVENOUS | Status: AC
  Administered 2020-08-07: 5 mg via INTRAVENOUS
  Filled 2020-08-07: qty 5

## 2020-08-07 MED ORDER — DOCUSATE SODIUM 100 MG PO CAPS
100.0000 mg | ORAL_CAPSULE | Freq: Two times a day (BID) | ORAL | Status: DC
  Administered 2020-08-08 – 2020-08-09 (×2): 100 mg via ORAL
  Filled 2020-08-07 (×3): qty 1

## 2020-08-07 MED ORDER — ORAL CARE MOUTH RINSE
15.0000 mL | Freq: Two times a day (BID) | OROMUCOSAL | Status: DC
  Administered 2020-08-07 – 2020-08-14 (×10): 15 mL via OROMUCOSAL

## 2020-08-07 MED ORDER — DILTIAZEM HCL 25 MG/5ML IV SOLN: 5.0000 mg | Freq: Once | INTRAVENOUS | Status: AC

## 2020-08-07 MED ORDER — ONDANSETRON HCL 4 MG/2ML IJ SOLN
INTRAMUSCULAR | Status: DC | PRN
  Administered 2020-08-07: 4 mg via INTRAVENOUS

## 2020-08-07 MED ORDER — METOPROLOL TARTRATE 5 MG/5ML IV SOLN
5.0000 mg | INTRAVENOUS | Status: AC | PRN
  Administered 2020-08-07: 5 mg via INTRAVENOUS

## 2020-08-07 MED ORDER — TRANEXAMIC ACID-NACL 1000-0.7 MG/100ML-% IV SOLN
INTRAVENOUS | Status: AC
  Filled 2020-08-07: qty 100

## 2020-08-07 MED ORDER — ONDANSETRON HCL 4 MG/2ML IJ SOLN: 4.0000 mg | Freq: Four times a day (QID) | INTRAMUSCULAR | Status: DC | PRN

## 2020-08-07 MED ORDER — ONDANSETRON HCL 4 MG/2ML IJ SOLN
INTRAMUSCULAR | Status: AC
  Filled 2020-08-07: qty 2

## 2020-08-07 MED ORDER — SODIUM CHLORIDE 0.45 % IV SOLN: INTRAVENOUS | Status: DC

## 2020-08-07 MED ORDER — CHLORHEXIDINE GLUCONATE 4 % EX LIQD: 60.0000 mL | Freq: Once | CUTANEOUS | Status: DC

## 2020-08-07 MED ORDER — ESMOLOL HCL 100 MG/10ML IV SOLN
INTRAVENOUS | Status: AC
  Filled 2020-08-07: qty 10

## 2020-08-07 MED ORDER — FERROUS SULFATE 325 (65 FE) MG PO TABS
325.0000 mg | ORAL_TABLET | Freq: Two times a day (BID) | ORAL | Status: DC
  Administered 2020-08-08 – 2020-08-14 (×13): 325 mg via ORAL
  Filled 2020-08-07 (×14): qty 1

## 2020-08-07 MED ORDER — ONDANSETRON HCL 4 MG/2ML IJ SOLN: 4.0000 mg | Freq: Once | INTRAMUSCULAR | Status: DC | PRN

## 2020-08-07 MED ORDER — METOPROLOL TARTRATE 5 MG/5ML IV SOLN
2.5000 mg | Freq: Four times a day (QID) | INTRAVENOUS | Status: DC | PRN
  Filled 2020-08-07 (×3): qty 5

## 2020-08-07 MED ORDER — OXYCODONE HCL 5 MG PO TABS: 10.0000 mg | ORAL_TABLET | ORAL | Status: DC | PRN

## 2020-08-07 MED ORDER — LACTATED RINGERS IV SOLN: INTRAVENOUS | Status: DC

## 2020-08-07 MED ORDER — BUPIVACAINE-EPINEPHRINE 0.5% -1:200000 IJ SOLN
INTRAMUSCULAR | Status: AC
  Filled 2020-08-07: qty 1

## 2020-08-07 MED ORDER — HYDRALAZINE HCL 25 MG PO TABS
25.0000 mg | ORAL_TABLET | Freq: Two times a day (BID) | ORAL | Status: DC
  Administered 2020-08-07 – 2020-08-08 (×2): 25 mg via ORAL
  Filled 2020-08-07 (×2): qty 1

## 2020-08-07 MED ORDER — FENTANYL CITRATE (PF) 100 MCG/2ML IJ SOLN
INTRAMUSCULAR | Status: DC | PRN
Start: 2020-08-07 — End: 2020-08-07
  Administered 2020-08-07 (×4): 50 ug via INTRAVENOUS

## 2020-08-07 MED ORDER — ACETAMINOPHEN 325 MG PO TABS
325.0000 mg | ORAL_TABLET | Freq: Four times a day (QID) | ORAL | Status: DC | PRN
  Administered 2020-08-11 – 2020-08-12 (×2): 650 mg via ORAL
  Filled 2020-08-07 (×2): qty 2

## 2020-08-07 SURGICAL SUPPLY — 52 items
BAG SPEC THK2 15X12 ZIP CLS (MISCELLANEOUS)
BAG ZIPLOCK 12X15 (MISCELLANEOUS) ×1 IMPLANT
BIT DRILL 2.4X128 (BIT) ×2 IMPLANT
BIT DRILL 2.4X128MM (BIT) ×1
BLADE SAW SAG 73X25 THK (BLADE) ×2
BLADE SAW SGTL 73X25 THK (BLADE) ×1 IMPLANT
BRUSH FEMORAL CANAL (MISCELLANEOUS) IMPLANT
COVER WAND RF STERILE (DRAPES) IMPLANT
DRAPE INCISE IOBAN 66X45 STRL (DRAPES) ×3 IMPLANT
DRAPE ORTHO SPLIT 77X108 STRL (DRAPES) ×6
DRAPE POUCH INSTRU U-SHP 10X18 (DRAPES) ×3 IMPLANT
DRAPE SHEET LG 3/4 BI-LAMINATE (DRAPES) ×3 IMPLANT
DRAPE SURG ORHT 6 SPLT 77X108 (DRAPES) ×2 IMPLANT
DRSG AQUACEL AG ADV 3.5X 6 (GAUZE/BANDAGES/DRESSINGS) ×2 IMPLANT
DRSG MEPILEX BORDER 4X8 (GAUZE/BANDAGES/DRESSINGS) ×3 IMPLANT
DURAPREP 26ML APPLICATOR (WOUND CARE) ×3 IMPLANT
ELECT BLADE TIP CTD 4 INCH (ELECTRODE) ×3 IMPLANT
ELECT REM PT RETURN 15FT ADLT (MISCELLANEOUS) ×3 IMPLANT
EVACUATOR 1/8 PVC DRAIN (DRAIN) IMPLANT
GLOVE BIO SURGEON STRL SZ8 (GLOVE) ×14 IMPLANT
GLOVE ORTHO TXT STRL SZ7.5 (GLOVE) ×6 IMPLANT
HANDPIECE INTERPULSE COAX TIP (DISPOSABLE)
HEAD FEM UNIPOLAR 49 OD STRL (Hips) ×2 IMPLANT
HOOD PEEL AWAY FLYTE STAYCOOL (MISCELLANEOUS) ×6 IMPLANT
IMMOBILIZER KNEE 20 (SOFTGOODS)
IMMOBILIZER KNEE 20 THIGH 36 (SOFTGOODS) IMPLANT
KIT TURNOVER KIT A (KITS) IMPLANT
MANIFOLD NEPTUNE II (INSTRUMENTS) ×3 IMPLANT
NEEDLE HYPO 22GX1.5 SAFETY (NEEDLE) ×3 IMPLANT
NS IRRIG 1000ML POUR BTL (IV SOLUTION) ×3 IMPLANT
PACK TOTAL JOINT (CUSTOM PROCEDURE TRAY) ×3 IMPLANT
PASSER SUT SWANSON 36MM LOOP (INSTRUMENTS) ×3 IMPLANT
PENCIL SMOKE EVACUATOR (MISCELLANEOUS) IMPLANT
PRESSURIZER FEMORAL UNIV (MISCELLANEOUS) IMPLANT
PROTECTOR NERVE ULNAR (MISCELLANEOUS) ×3 IMPLANT
SET HNDPC FAN SPRY TIP SCT (DISPOSABLE) IMPLANT
SPACER DEPUY (Hips) ×2 IMPLANT
STAPLER VISISTAT 35W (STAPLE) ×3 IMPLANT
STEM SUMMIT BASIC PRESSFIT SZ5 (Hips) ×2 IMPLANT
SUCTION FRAZIER HANDLE 10FR (MISCELLANEOUS) ×3
SUCTION TUBE FRAZIER 10FR DISP (MISCELLANEOUS) ×1 IMPLANT
SUT ETHIBOND NAB CT1 #1 30IN (SUTURE) ×12 IMPLANT
SUT VIC AB 0 CTX 27 (SUTURE) ×6 IMPLANT
SUT VIC AB 1 CTX 36 (SUTURE) ×12
SUT VIC AB 1 CTX36XBRD ANBCTR (SUTURE) ×4 IMPLANT
SUT VIC AB 2-0 CT1 27 (SUTURE) ×9
SUT VIC AB 2-0 CT1 TAPERPNT 27 (SUTURE) ×3 IMPLANT
SYR CONTROL 10ML LL (SYRINGE) ×3 IMPLANT
TOWEL OR 17X26 10 PK STRL BLUE (TOWEL DISPOSABLE) ×3 IMPLANT
TOWER CARTRIDGE SMART MIX (DISPOSABLE) IMPLANT
TRAY FOLEY MTR SLVR 16FR STAT (SET/KITS/TRAYS/PACK) IMPLANT
WATER STERILE IRR 1000ML POUR (IV SOLUTION) ×3 IMPLANT

## 2020-08-07 NOTE — Op Note (Signed)
NAME: Marcus JR., Marcus J. MEDICAL RECORD IF:[id number] ACCOUNT [id number] DATE OF BIRTH:19-Nov-1940 FACILITY: [facility] LOCATION: [facility] PHYSICIAN:Aliyah Abeyta L. Amyla Heffner, MD  OPERATIVE REPORT  DATE OF PROCEDURE:  08/07/2020  PREOPERATIVE DIAGNOSIS:  Femoral neck fracture, left.  POSTOPERATIVE DIAGNOSIS:  Femoral neck fracture, left.  PROCEDURE:  Left hemiarthroplasty with a DePuy hip fracture stem size 5, a 49 mm monopolar ball, and a minus neck.  SURGEON:  [name], MD  ASSISTANT:  Greggory Stallion, PA-C, who was present for the entire case and assisted by manipulation of tissues, manipulation of the leg, and closing to minimize OR time.  BRIEF HISTORY:  The patient is a 79 year old male with a long history of significant complaints of left hip pain after a fall.  He ultimately was seen in the emergency room, was noted to have a left femoral neck fracture.  He unfortunately was left side  hemiparetic and spent most of his time in a wheelchair.  His left arm was a bit of a flail arm.  His x-ray showed that he had a proximal humerus fracture, nondisplaced and a radial head fracture, nondisplaced.  I talked to him about treatment options,  but felt that hemiarthroplasty was most appropriate to try to get him out of pain and allow him to sit up and mobilize and he agreed and he was taken to the operating room for that.  DESCRIPTION OF PROCEDURE:  The patient was taken to the operating room.  After adequate anesthesia obtained with a general anesthetic, he was placed supine on the operating table and moved into the right lateral decubitus position.  All bony prominences  well padded.  Attention was then turned to the left hip, where after routine prep and drape, an incision was made for a posterior approach to the hip.  Subcutaneous tissue to the level of the tensor fascia divided in line with its fibers and a Charnley  retractor was put in place.  Dissection was carried down to the posterior capsule where  we identified the piriformis and short external rotators.  These were divided and the hip capsule was divided and tagged and the piriformis was tagged.  A provisional  neck cut is made and then the femoral head was removed, sized on the back to a 49.  Ball on a stick was used and the 49 seemed to be the appropriate size.  At this point, attention was turned to the stem side.  We used a cookie cutter, followed by an  introducer, followed by a lateralizer, followed by sequentially rasping, broaching the stem up to a size 5.  Size 5 was a great fit.  We opened and placed a size 5 stem and then trialed him initially with a minus ball and felt like that was appropriate  and then the minus ball was opened and placed.  Once this was done, I put him through a range of motion.  Excellent stability and range of motion were achieved and short external rotators were repaired to the posterior trochanteric line through drill  holes.  The tensor fascia was closed with #1 Vicryl running.  The skin was closed with 0 Vicryl, 2-0 Vicryl and 3-0 Monocryl subcuticular.  Benzoin and Steri-Strips were applied.  Sterile compressive dressing was applied.  The patient was taken to  recovery, was noted to be in satisfactory condition.  Estimated blood loss for procedure was minimal.  VN/NUANCE  D:08/07/2020 T:08/07/2020 JOB:[id number]

## 2020-08-07 NOTE — Progress Notes (Signed)
Per Dr Luiz Blare, VSS to be sent to Dr Lajuana Ripple prior to dischrage from PACU.

## 2020-08-07 NOTE — Anesthesia Preprocedure Evaluation (Addendum)
Anesthesia Evaluation  Patient identified by MRN, date of birth, ID band Patient awake    Reviewed: Allergy & Precautions, NPO status , Patient's Chart, lab work & pertinent test results  History of Anesthesia Complications Negative for: history of anesthetic complications  Airway Mallampati: III  TM Distance: >3 FB Neck ROM: Full    Dental  (+) Dental Advisory Given, Partial Lower, Partial Upper   Pulmonary former smoker,    Pulmonary exam normal        Cardiovascular hypertension, Pt. on medications Normal cardiovascular exam     Neuro/Psych CVA, Residual Symptoms negative psych ROS   GI/Hepatic Neg liver ROS, GERD  Controlled,  Endo/Other  diabetes, Type 2, Oral Hypoglycemic Agents, Insulin Dependent Hypernatremia, Na 150 Hyperchloremia, Cl 112  Renal/GU Renal InsufficiencyRenal disease     Musculoskeletal negative musculoskeletal ROS (+)   Abdominal   Peds  Hematology  Plt 203k On ASA 325mg     Anesthesia Other Findings Covid test negative   Reproductive/Obstetrics                            Anesthesia Physical Anesthesia Plan  ASA: III  Anesthesia Plan: General   Post-op Pain Management:    Induction: Intravenous  PONV Risk Score and Plan: 2 and Treatment may vary due to age or medical condition, Ondansetron and Propofol infusion  Airway Management Planned: LMA  Additional Equipment: None  Intra-op Plan:   Post-operative Plan: Extubation in OR  Informed Consent: I have reviewed the patients History and Physical, chart, labs and discussed the procedure including the risks, benefits and alternatives for the proposed anesthesia with the patient or authorized representative who has indicated his/her understanding and acceptance.     Dental advisory given  Plan Discussed with: CRNA and Anesthesiologist  Anesthesia Plan Comments:        Anesthesia Quick  Evaluation

## 2020-08-07 NOTE — Consult Note (Addendum)
NAME:  Marcus Kittel., MRN:  [id number], DOB:  03/10/1941, LOS: 1 ADMISSION DATE:  08/06/2020, CONSULTATION DATE: 08/07/2020 REFERRING MD: Roger Shelter MD, CHIEF COMPLAINT: Postop shock, tachycardia  Brief History   79 year old with diabetes, hypertension, hyperlipidemia, CVA presenting with fall, humerus and hip fracture Noted to be significantly hypertensive with tachycardia and AKI secondary to dehydration and DKA.  Taking for hip hemiarthroplasty.  Developed shock during procedure.  Estimated blood loss minimal per report PCCM consulted  Past Medical History    has a past medical history of CRI (chronic renal insufficiency), CVA (cerebral infarction) (1997), Diabetes mellitus type II (1992), HLD (hyperlipidemia) (10/1998), HTN (hypertension) (10/1998), Primary open angle glaucoma, RBBB, and Stroke (HCC).  Significant Hospital Events   9/19-admit 9/20-hip hemiarthroplasty  Consults:  Ortho, PCCM  Procedures:    Significant Diagnostic Tests:  CT head, cervical spine 9/20-generalized atrophy, chronic right parietal infarct.  No acute process.  Chest x-ray 9/19-no acute abnormality. I reviewed the images.  EKG 9/20-sinus tachycardia, chronic right bundle branch block  Micro Data:    Antimicrobials:  Cefazolin  Interim history/subjective:    Objective   Blood pressure 112/81, pulse (!) 150, temperature 98 F (36.7 C), resp. rate 19, height 5' 11.5" (1.816 m), weight 87.5 kg, SpO2 95 %.        Intake/Output Summary (Last 24 hours) at 08/07/2020 1807 Last data filed at 08/07/2020 1632 Gross per 24 hour  Intake 2411.07 ml  Output 850 ml  Net 1561.07 ml   Filed Weights   08/06/20 1636 08/07/20 1415  Weight: 87.5 kg 87.5 kg    Examination: Blood pressure 118/68, pulse (!) 146, temperature 98 F (36.7 C), resp. rate 17, height 5' 11.5" (1.816 m), weight 87.5 kg, SpO2 97 %. Gen:      No acute distress HEENT:  EOMI, sclera anicteric Neck:     No masses; no  thyromegaly Lungs:    Clear to auscultation bilaterally; normal respiratory effort CV:         Regular rate and rhythm; no murmurs Abd:      + bowel sounds; soft, non-tender; no palpable masses, no distension Ext:    No edema; adequate peripheral perfusion Skin:      Warm and dry; no rash Neuro: alert and oriented x 3 Psych: normal mood and affect  Resolved Hospital Problem list     Assessment & Plan:  Hypotension, sinus tachycardia Has required Neo-Synephrine in the OR but is BP better now IV fluid resuscitation.  Bolus LR and IV fluids Check CBC, lactic acid and cultures Cefazolin for surgical prophylaxis.  Hold off on additional antibiotics as its not clear if he is an infection PRN Lopressor, Cardizem as long as blood pressure tolerates  Dehydration, AKI Continue IV fluids with LR  Diabetes, mild DKA Continue insulin, monitor sugars  Hyperlipidemia Continue statin  History of CVA with hemiparesis Monitor neuro status  Best practice:  Diet: NPO Pain/Anxiety/Delirium protocol (if indicated): NA VAP protocol (if indicated): NA DVT prophylaxis: SCDs GI prophylaxis: NA Glucose control: Insulin, SSI Mobility: Bed Code Status: Full Family Communication:  pending Disposition: SDU  Labs   CBC: Recent Labs  Lab 08/06/20 1801 08/06/20 1808 08/07/20 0500  WBC 12.4*  --  11.2*  NEUTROABS 10.3*  --   --   HGB 14.9 15.3 13.2  HCT 43.6 45.0 38.9*  MCV 74.8*  --  75.1*  PLT 223  --  203    Basic Metabolic Panel: Recent Labs  Lab 08/06/20 1801 08/06/20 1808 08/07/20 0500 08/07/20 1058  NA 142 145 154* 150*  K 4.7 4.4 3.9 3.7  CL 103 109 114* 112*  CO2 19*  --  24 22  GLUCOSE 469* 460* 186* 183*  BUN 49* 45* 36* 31*  CREATININE 1.74* 1.60* 1.46* 1.22  CALCIUM 8.9  --  8.9 8.3*   GFR: Estimated Creatinine Clearance: 54 mL/min (by C-G formula based on SCr of 1.22 mg/dL). Recent Labs  Lab 08/06/20 1801 08/07/20 0500  WBC 12.4* 11.2*  LATICACIDVEN 2.5*   --     Liver Function Tests: Recent Labs  Lab 08/06/20 1801  AST 62*  ALT 43  ALKPHOS 172*  BILITOT 1.5*  PROT 8.7*  ALBUMIN 3.5   Recent Labs  Lab 08/06/20 1801  LIPASE 19   No results for input(s): AMMONIA in the last 168 hours.  ABG    Component Value Date/Time   HCO3 20.5 08/06/2020 1815   TCO2 20 (L) 08/06/2020 1808   ACIDBASEDEF 4.5 (H) 08/06/2020 1815   O2SAT 69.6 08/06/2020 1815     Coagulation Profile: No results for input(s): INR, PROTIME in the last 168 hours.  Cardiac Enzymes: No results for input(s): CKTOTAL, CKMB, CKMBINDEX, TROPONINI in the last 168 hours.  HbA1C: Hgb A1c MFr Bld  Date/Time Value Ref Range Status  08/06/2020 06:01 PM 7.7 (H) 4.8 - 5.6 % Final    Comment:    (NOTE) Pre diabetes:          5.7%-6.4%  Diabetes:              >6.4%  Glycemic control for   <7.0% adults with diabetes   05/01/2020 12:30 PM 8.2 (H) 4.6 - 6.5 % Final    Comment:    Glycemic Control Guidelines for People with Diabetes:Non Diabetic:  <6%Goal of Therapy: <7%Additional Action Suggested:  >8%     CBG: Recent Labs  Lab 08/06/20 1753 08/06/20 2134 08/07/20 0824 08/07/20 1146 08/07/20 1429  GLUCAP 418* 342* 177* 175* 94    Review of Systems:   REVIEW OF SYSTEMS:   All negative; except for those that are bolded, which indicate positives.  Constitutional: weight loss, weight gain, night sweats, fevers, chills, fatigue, weakness.  HEENT: headaches, sore throat, sneezing, nasal congestion, post nasal drip, difficulty swallowing, tooth/dental problems, visual complaints, visual changes, ear aches. Neuro: difficulty with speech, weakness, numbness, ataxia. CV:  chest pain, orthopnea, PND, swelling in lower extremities, dizziness, palpitations, syncope.  Resp: cough, hemoptysis, dyspnea, wheezing. GI: heartburn, indigestion, abdominal pain, nausea, vomiting, diarrhea, constipation, change in bowel habits, loss of appetite, hematemesis, melena,  hematochezia.  GU: dysuria, change in color of urine, urgency or frequency, flank pain, hematuria. MSK: joint pain or swelling, decreased range of motion. Psych: change in mood or affect, depression, anxiety, suicidal ideations, homicidal ideations. Skin: rash, itching, bruising.  Past Medical History  He,  has a past medical history of CRI (chronic renal insufficiency), CVA (cerebral infarction) (1997), Diabetes mellitus type II (1992), HLD (hyperlipidemia) (10/1998), HTN (hypertension) (10/1998), Primary open angle glaucoma, RBBB, and Stroke (HCC).   Surgical History    Past Surgical History:  Procedure Laterality Date  . CATARACT EXTRACTION W/PHACO  09/09/2012   Procedure: CATARACT EXTRACTION PHACO AND INTRAOCULAR LENS PLACEMENT (IOC);  Surgeon: [name], MD;  Location: [facility] OR;  Service: Ophthalmology;  Laterality: Right;  . CATARACT EXTRACTION W/PHACO Left 03/31/2013   Procedure: CATARACT EXTRACTION PHACO AND INTRAOCULAR LENS PLACEMENT (IOC);  Surgeon: [name], MD;  Location: [facility] OR;  Service: Ophthalmology;  Laterality: Left;  . EYE SURGERY    . lipoma removal  1970's   Right shoulder  . US ECHOCARDIOGRAPHY  03/2013   mod LVH, EF 60%, normal wall motion     Social History   reports that he quit smoking about 23 years ago. His smoking use included cigarettes. He has a 30.00 pack-year smoking history. He has never used smokeless tobacco. He reports that he does not drink alcohol and does not use drugs.   Family History   His family history includes Diabetes in his sister; Heart attack (age of onset: 4677) in his father; Hypertension in his sister. There is no history of Stroke or Cancer.   Allergies Allergies  Allergen Reactions  . Tape Rash    Burns     Home Medications  Prior to Admission medications   Medication Sig Start Date End Date Taking? Authorizing Provider  amLODipine-atorvastatin (CADUET) 10-40 MG tablet TAKE 1 TABLET BY MOUTH EVERY DAY Patient taking  differently: Take 1 tablet by mouth daily.  08/02/20  Yes Eustaquio BoydenGutierrez, Javier, MD  aspirin 325 MG EC tablet Take 325 mg by mouth daily. For stroke prevention     Yes [provider]  brimonidine (ALPHAGAN P) 0.1 % SOLN Place 1 drop into both eyes 3 (three) times daily.   Yes [provider]  diltiazem (CARDIZEM CD) 360 MG 24 hr capsule TAKE 1 CAPSULE BY MOUTH EVERY DAY Patient taking differently: Take 360 mg by mouth daily.  06/19/20  Yes Eustaquio BoydenGutierrez, Javier, MD  ferrous sulfate 325 (65 FE) MG tablet Take 325 mg by mouth daily.  08/31/18  Yes Eustaquio BoydenGutierrez, Javier, MD  hydrochlorothiazide (HYDRODIURIL) 25 MG tablet TAKE 1 TABLET BY MOUTH EVERY DAY Patient taking differently: Take 25 mg by mouth daily.  06/15/20  Yes Eustaquio BoydenGutierrez, Javier, MD  insulin NPH-regular Human (70-30) 100 UNIT/ML injection Inject 22-25 Units into the skin See admin instructions. Inject 25 units subcutaneously with breakfast and 22 units with supper   Yes [provider]  JARDIANCE 10 MG TABS tablet TAKE 1 TABLET BY MOUTH EVERY DAY Patient taking differently: Take 10 mg by mouth daily before supper.  06/15/20  Yes Eustaquio BoydenGutierrez, Javier, MD  latanoprost (XALATAN) 0.005 % ophthalmic solution Place 1 drop into both eyes at bedtime.   Yes [provider]  losartan (COZAAR) 100 MG tablet TAKE 1 TABLET BY MOUTH EVERY DAY Patient taking differently: Take 100 mg by mouth daily.  06/12/20  Yes Eustaquio BoydenGutierrez, Javier, MD  metFORMIN (GLUCOPHAGE-XR) 500 MG 24 hr tablet TAKE 1 TABLET BY MOUTH EVERY DAY WITH BREAKFAST Patient taking differently: Take 500 mg by mouth daily.  06/12/20  Yes Eustaquio BoydenGutierrez, Javier, MD  naproxen sodium (ALEVE) 220 MG tablet Take 220 mg by mouth 2 (two) times daily as needed (pain).   Yes [provider]  potassium chloride (KLOR-CON) 10 MEQ tablet TAKE 3 TABLETS (30 MEQ TOTAL) BY MOUTH DAILY. 06/12/20  Yes Eustaquio BoydenGutierrez, Javier, MD  brimonidine (ALPHAGAN P) 0.15 % ophthalmic solution Place 1 drop into  both eyes 3 (three) times daily. Patient not taking: Reported on 08/06/2020 08/05/19   Eustaquio BoydenGutierrez, Javier, MD  diclofenac sodium (VOLTAREN) 1 % GEL Apply 1 application topically 3 (three) times daily as needed. Patient not taking: Reported on 08/06/2020 04/29/18   Eustaquio BoydenGutierrez, Javier, MD  glucose blood [facility](EMBRACE BLOOD GLUCOSE TEST) test strip Check three times daily and when feeling ill E11.8 insulin dependent 12/12/17   Eustaquio BoydenGutierrez, Javier, MD  insulin aspart protamine- aspart (NOVOLOG MIX 70/30) (70-30) 100 UNIT/ML injection Take 25 units with breakfast and 22 units with supper. QS 1 month Patient not taking: Reported on 08/06/2020 04/29/18   [name], MD  Latanoprostene Bunod (VYZULTA) 0.024 % SOLN Apply 1 drop to eye at bedtime. Patient not taking: Reported on 08/06/2020 08/05/19   [name], MD     Critical care time:    The patient is critically ill with multiple organ system failure and requires high complexity decision making for assessment and support, frequent evaluation and titration of therapies, advanced monitoring, review of radiographic studies and interpretation of complex data.   Critical Care Time devoted to patient care services, exclusive of separately billable procedures, described in this note is 35 minutes.   Chilton Greathouse MD [facility] Please see [email or web address] for pager details.  08/07/2020, 6:08 PM

## 2020-08-07 NOTE — Anesthesia Postprocedure Evaluation (Signed)
Anesthesia Post Note  Patient: Marcus Downs.  Procedure(s) Performed: ARTHROPLASTY BIPOLAR HIP (HEMIARTHROPLASTY) (Left Hip)     Patient location during evaluation: PACU Anesthesia Type: General Level of consciousness: awake and alert Pain management: pain level controlled Vital Signs Assessment: post-procedure vital signs reviewed and stable Respiratory status: spontaneous breathing, nonlabored ventilation, respiratory function stable and patient connected to nasal cannula oxygen Cardiovascular status: blood pressure returned to baseline, stable and tachycardic (Despite multiple doses of diltiazem, HR remains 140+ consistently. Denies chest pain or SOB. ICU team in PACU to evaluate, patient being admitted to ICU) Postop Assessment: no apparent nausea or vomiting Anesthetic complications: no   No complications documented.  Last Vitals:  Vitals:   08/07/20 1823 08/07/20 1830  BP: 118/68   Pulse: (!) 146 (!) 147  Resp: 17 12  Temp:    SpO2: 97% 98%    Last Pain:  Vitals:   08/07/20 1830  TempSrc:   PainSc: 0-No pain                 Beryle Lathe

## 2020-08-07 NOTE — Anesthesia Procedure Notes (Signed)
Procedure Name: LMA Insertion Date/Time: 08/07/2020 3:14 PM Performed by: [place], Aiken Withem C, CRNA Pre-anesthesia Checklist: Patient identified, Emergency Drugs available, Suction available and Patient being monitored Patient Re-evaluated:Patient Re-evaluated prior to induction Oxygen Delivery Method: Circle system utilized Preoxygenation: Pre-oxygenation with 100% oxygen Induction Type: IV induction Ventilation: Mask ventilation without difficulty LMA: LMA inserted LMA Size: 4.0 Number of attempts: 1 Airway Equipment and Method: Bite block Placement Confirmation: positive ETCO2 Tube secured with: Tape Dental Injury: Teeth and Oropharynx as per pre-operative assessment

## 2020-08-07 NOTE — Progress Notes (Signed)
PROGRESS NOTE    Marcus Downs.  CLE:[id number]  DOB: 13-Jan-1941  PCP: Marcus Bush, MD Admit date:08/06/2020 Chief compliant:  left hip pain after fell 79 y.o. male who comes from home, with a known history of type 2 diabetes mellitus, hypertension, dyslipidemia and CVA with residual left-sided weakness, who presented to the emergency room, with acute onset of left hip pain after suffering a mechanical  Fall. Also reoprted missing insulin doses for couple of days, now with polyuria /polydipsia. ED Course: Afebrile, BP 144/79 with HR 131.Labs revealed a blood glucose of 469 and a CO2 of 19, BUN of 49 and a creatinine of 1.74  Up from 21/1.31 on 05/01/2020 and LFTs were remarkable for alk phos 172, AST 62 and total protein 8.7 with total bili 1.5.  VBG showed pH 7.33, PCO2 39.6, PO2 44 bicarbonate of 20.5.  Noncontrasted head CT scan revealed generalized cerebral atrophy, chronic predominant right parietal lobe infarct with no acute intracranial abnormality.  C-spine CT showed cervical spine spondylosis most notable from C5-C7 with no acute fracture or malalignment of the spine. EKG showed sinus and possibly ectopic atrial tachycardia with a rate of 128 with multiple PVCs, RBBB. CXR -atelectasis. Left shoulder x-ray showed a subtle fracture through the neck of the left humerus. Left elbow x-ray showed irregularity at the radial head on the lateral view concerning for subtle fracture.Left hip x-ray showed subcapital fracture through the proximal left hip with limited evaluation for dislocation. The patient was given 1.5 L bolus of IV normal saline followed by 100 mL/h Hospital course: Patient admitted to [facility] with Ortho consultation--going to OR Monday 9/20. IV fluids for dehydration, mild AKI and sinus tachy.  HCTZ/Cozaar held.  Subjective:  Patient still in the ED, awaiting bed availability.  He states that he hurts only when moves.  He is requesting assistance with putting on the TV is unable to  move his left arm.  He states at baseline he does have left hemiplegia with very minimal mobility.  He apparently soiled himself earlier and now has an external Foley catheter, room with strong urine smell.  Objective: Vitals:   08/07/20 0300 08/07/20 0529 08/07/20 0726 08/07/20 0807  BP: 130/67 128/72 (!) 143/83 (!) 144/91  Pulse: (!) 125 (!) 115 (!) 119 (!) 128  Resp: 20 (!) 22 20 (!) 23  Temp:      TempSrc:      SpO2: 96% 98% 98% 98%  Weight:      Height:        Intake/Output Summary (Last 24 hours) at 08/07/2020 9449 Last data filed at 08/06/2020 2126 Gross per 24 hour  Intake 1500 ml  Output --  Net 1500 ml   Filed Weights   08/06/20 1636  Weight: 87.5 kg    Physical Examination:   General: Moderately built, no acute distress noted Head ENT: Atraumatic normocephalic, PERRLA, neck supple Heart: S1-S2 heard, regular rate and rhythm, no murmurs.  No leg edema noted Lungs: Equal air entry bilaterally, no rhonchi or rales on exam, no accessory muscle use Abdomen: Bowel sounds heard, soft, nontender, nondistended. No organomegaly.  No CVA tenderness Extremities: Angulated left lower extremity, adducted left upper extremity, baseline left hemiparesis. Neurological: Awake alert oriented x3, left hemiplegia  skin: No wounds or rashes.    Data Reviewed: I have personally reviewed following labs and imaging studies  CBC: Recent Labs  Lab 08/06/20 1801 08/06/20 1808 08/07/20 0500  WBC 12.4*  --  11.2*  NEUTROABS 10.3*  --   --  HGB 14.9 15.3 13.2  HCT 43.6 45.0 38.9*  MCV 74.8*  --  75.1*  PLT 223  --  267   Basic Metabolic Panel: Recent Labs  Lab 08/06/20 1801 08/06/20 1808 08/07/20 0500  NA 142 145 154*  K 4.7 4.4 3.9  CL 103 109 114*  CO2 19*  --  24  GLUCOSE 469* 460* 186*  BUN 49* 45* 36*  CREATININE 1.74* 1.60* 1.46*  CALCIUM 8.9  --  8.9   GFR: Estimated Creatinine Clearance: 45.1 mL/min (A) (by C-G formula based on SCr of 1.46 mg/dL (H)). Liver  Function Tests: Recent Labs  Lab 08/06/20 1801  AST 62*  ALT 43  ALKPHOS 172*  BILITOT 1.5*  PROT 8.7*  ALBUMIN 3.5   Recent Labs  Lab 08/06/20 1801  LIPASE 19   No results for input(s): AMMONIA in the last 168 hours. Coagulation Profile: No results for input(s): INR, PROTIME in the last 168 hours. Cardiac Enzymes: No results for input(s): CKTOTAL, CKMB, CKMBINDEX, TROPONINI in the last 168 hours. BNP (last 3 results) No results for input(s): PROBNP in the last 8760 hours. HbA1C: Recent Labs    08/06/20 1801  HGBA1C 7.7*   CBG: Recent Labs  Lab 08/06/20 1753 08/06/20 2134 08/07/20 0824  GLUCAP 418* 342* 177*   Lipid Profile: No results for input(s): CHOL, HDL, LDLCALC, TRIG, CHOLHDL, LDLDIRECT in the last 72 hours. Thyroid Function Tests: No results for input(s): TSH, T4TOTAL, FREET4, T3FREE, THYROIDAB in the last 72 hours. Anemia Panel: No results for input(s): VITAMINB12, FOLATE, FERRITIN, TIBC, IRON, RETICCTPCT in the last 72 hours. Sepsis Labs: Recent Labs  Lab 08/06/20 1801  LATICACIDVEN 2.5*    Recent Results (from the past 240 hour(s))  SARS Coronavirus 2 by RT PCR (hospital order, performed in [facility] hospital lab) Nasopharyngeal Nasopharyngeal Swab     Status: None   Collection Time: 08/06/20  6:01 PM   Specimen: Nasopharyngeal Swab  Result Value Ref Range Status   SARS Coronavirus 2 NEGATIVE NEGATIVE Final    Comment: (NOTE) SARS-CoV-2 target nucleic acids are NOT DETECTED.  The SARS-CoV-2 RNA is generally detectable in upper and lower respiratory specimens during the acute phase of infection. The lowest concentration of SARS-CoV-2 viral copies this assay can detect is 250 copies / mL. A negative result does not preclude SARS-CoV-2 infection and should not be used as the sole basis for treatment or other patient management decisions.  A negative result may occur with improper specimen collection / handling, submission of specimen  other than nasopharyngeal swab, presence of viral mutation(s) within the areas targeted by this assay, and inadequate number of viral copies (<250 copies / mL). A negative result must be combined with clinical observations, patient history, and epidemiological information.  Fact Sheet for Patients:   StrictlyIdeas.no  Fact Sheet for Healthcare Providers: BankingDealers.co.za  This test is not yet approved or  cleared by the [place] FDA and has been authorized for detection and/or diagnosis of SARS-CoV-2 by FDA under an Emergency Use Authorization (EUA).  This EUA will remain in effect (meaning this test can be used) for the duration of the COVID-19 declaration under Section 564(b)(1) of the Act, 21 U.S.C. section 360bbb-3(b)(1), unless the authorization is terminated or revoked sooner.  Performed at [facility], [place] [address], [place], [place] [address]       Radiology Studies: DG Chest 1 View  Result Date: 08/06/2020 CLINICAL DATA:  Left leg pain related to fall. EXAM:  CHEST  1 VIEW COMPARISON:  October 30, 2013 FINDINGS: The heart, hila, and mediastinum are normal. No pneumothorax. Minimal atelectasis in the left base. The lungs are otherwise clear. No other acute abnormalities. IMPRESSION: No active disease. Electronically Signed   By: Dorise Bullion III M.D   On: 08/06/2020 18:08   DG Pelvis 1-2 Views  Result Date: 08/06/2020 CLINICAL DATA:  Pain after fall EXAM: PELVIS - 1-2 VIEW COMPARISON:  None. FINDINGS: A subcapital fracture seen to the left hip. Evaluation for dislocation is limited as only an AP view was obtained. No definite dislocation noted. No other bony abnormalities are identified. IMPRESSION: Subcapital fracture through the proximal left hip. Evaluation for dislocation is limited without a lateral view but there is no evidence of dislocation on this study. Electronically Signed   By: Dorise Bullion III M.D   On: 08/06/2020 18:09   DG Elbow Complete Left  Result Date: 08/06/2020 CLINICAL DATA:  Pain after fall EXAM: LEFT ELBOW - COMPLETE 3+ VIEW COMPARISON:  None. FINDINGS: Evaluation is limited due to positioning. The proximal ulna and radius are not well visualized on the first 3 images as a overlap the humerus. No joint effusion is seen on the lateral view. Mild irregularity of the radial head on the lateral view suggest the possibility of a subtle fracture. No other abnormalities. IMPRESSION: Evaluation is limited due to positioning as above. Irregularity of the radial head on the lateral view is concerning for a subtle fracture. No joint effusion identified. Electronically Signed   By: Dorise Bullion III M.D   On: 08/06/2020 18:07   CT Head Wo Contrast  Result Date: 08/06/2020 CLINICAL DATA:  Status post fall. EXAM: CT HEAD WITHOUT CONTRAST TECHNIQUE: Contiguous axial images were obtained from the base of the skull through the vertex without intravenous contrast. COMPARISON:  October 30, 2013 FINDINGS: Brain: There is mild to moderate severity cerebral atrophy with widening of the extra-axial spaces and ventricular dilatation. There are areas of decreased attenuation within the white matter tracts of the supratentorial brain, consistent with microvascular disease changes. A large area of cortical encephalomalacia, with adjacent chronic white matter low attenuation, is seen throughout the right hemisphere. This involves predominantly the right parietal lobe and is unchanged in appearance when compared to the prior study. Vascular: No hyperdense vessel or unexpected calcification. Skull: Normal. Negative for fracture or focal lesion. Sinuses/Orbits: No acute finding. Other: None. IMPRESSION: 1. No acute intracranial abnormality. 2. Generalized cerebral atrophy. 3. Chronic predominant right parietal lobe infarct. Electronically Signed   By: Virgina Norfolk M.D.   On: 08/06/2020 19:00    CT Cervical Spine Wo Contrast  Result Date: 08/06/2020 CLINICAL DATA:  Left leg pain related to fall EXAM: CT CERVICAL SPINE WITHOUT CONTRAST TECHNIQUE: Multidetector CT imaging of the cervical spine was performed without intravenous contrast. Multiplanar CT image reconstructions were also generated. COMPARISON:  None. FINDINGS: Alignment: There is straightening of the normal cervical lordosis. Skull base and vertebrae: Visualized skull base is intact. No atlanto-occipital dissociation. The vertebral body heights are well maintained. No fracture or pathologic osseous lesion seen. Soft tissues and spinal canal: The visualized paraspinal soft tissues are unremarkable. No prevertebral soft tissue swelling is seen. The spinal canal is grossly unremarkable, no large epidural collection or significant canal narrowing. Disc levels: Multilevel cervical spine spondylosis is seen with anterior osteophytes, disc osteophyte complex and uncovertebral osteophytes most notable C5-C6 and C6-C7 with moderate neural foraminal narrowing and mild central canal stenosis. Upper chest:  The lung apices are clear. Thoracic inlet is within normal limits. Other: None IMPRESSION: No acute fracture or malalignment of the spine. 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 Electronically Signed   By: Prudencio Pair M.D.   On: 08/06/2020 19:25   DG Shoulder Left  Addendum Date: 08/06/2020   ADDENDUM REPORT: 08/06/2020 18:05 ADDENDUM: Upon reviewing the left humeral images, there appears to be a subtle a fracture through the neck of the left humerus only seen on the true AP view. This finding was described on the humeral images. Electronically Signed   By: Dorise Bullion III M.D   On: 08/06/2020 18:05   Result Date: 08/06/2020 CLINICAL DATA:  Pain after fall EXAM: LEFT SHOULDER - 2+ VIEW COMPARISON:  None. FINDINGS: Irregularity along the inferior aspect of the glenoid suggest the possibility of previous trauma. No acute  fractures or dislocations are seen on today's study. Limited views of the chest are normal. IMPRESSION: Negative. Electronically Signed: By: Dorise Bullion III M.D On: 08/06/2020 18:01   DG Humerus Left  Result Date: 08/06/2020 CLINICAL DATA:  Pain after fall EXAM: LEFT HUMERUS - 2+ VIEW COMPARISON:  None. FINDINGS: There is a subtle lucency extending through the neck of the left humerus only seen on the final image. In retrospect, this is also seen on 1 of the images of the left shoulder but is very subtle. No other fractures. IMPRESSION: Subtle nondisplaced fracture through the neck of the humerus only seen on 1 image of the humeral films and 1 image of the left shoulder films. The finding is subtle but appears to be real. Electronically Signed   By: Dorise Bullion III M.D   On: 08/06/2020 18:04   DG Femur Min 2 Views Left  Result Date: 08/06/2020 CLINICAL DATA:  Pain after fall. EXAM: LEFT FEMUR 2 VIEWS COMPARISON:  None. FINDINGS: Only the distal half of the femur was imaged on the lateral view. Only the distal third of the femur was imaged on the AP view. The proximal half of the femur was imaged on the lateral view. Given the lack of visualization of the entire femur in all three views, evaluation is limited. Within these limitations, no fractures are seen. IMPRESSION: Only a portion of the femur was seen in each of the three views which limits evaluation. Within these limitations, no fractures are seen. Electronically Signed   By: Dorise Bullion III M.D   On: 08/06/2020 18:00      Scheduled Meds:  amLODipine  10 mg Oral Daily   And   atorvastatin  40 mg Oral Daily   diltiazem  300 mg Oral Daily   ferrous sulfate  325 mg Oral QODAY   insulin aspart  0-20 Units Subcutaneous TID PC & HS   insulin aspart protamine- aspart  25 Units Subcutaneous BID WC   latanoprost  1 drop Both Eyes QHS   potassium chloride  30 mEq Oral Daily   Continuous Infusions:  sodium chloride 125 mL/hr  at 08/07/20 0604      Assessment/Plan:  1.  Mechanical fall resulting in left upper and lower extremity fractures: Seen by orthopedics and plan for left hip hemiarthroplasty later this afternoon.  Remains n.p.o. with IV fluids.  2.  Dehydration with AKI:   Creatinine improving after IV hydration but now hypernatremic.Changed IV fluids to hypotonic fluids and will repeat labs.  Hold HCTZ, ARB's.  Hold Metformin  3.  Malignant hypertension: Blood pressure significantly elevated at systolic  140s to 902X and diastolic greater than 115.  Resume diltiazem at home dose of 360 mg and?  Also on Norvasc (would avoid dual calcium channel blockers).  Continue hydralazine as needed, will change Norvasc to hydralazine scheduled.  Add beta-blockers.  Diuretics, ACE inhibitors on hold due to problem #2.  Can hold potassium supplements as well  4.  Sinus tachycardia: Last TSH check in June 2020 was within normal limits. Continue diltiazem.  Will add beta-blockers.  Control pain.  5.  Diabetes mellitus with hyperglycemia, mild DKA: POA.  Resolved with IV fluids.  Bicarb now normalized.  Continue 70/30 insulin as well as sliding scale insulin.  Currently n.p.o. for  procedure but received a.m. dose of 70/30 insulin.  Blood glucose 177 earlier around 8:40 AM.  Will add dextrose fluids to avoid hypoglycemia.  If blood glucose goes up, can change back to half-normal saline. Of note, patient also on Jardiance/Metformin at home.  6.  Hyperlipidemia: Continue statins  7.  History of CVA: On aspirin 325 mg and statins at home.  Resume aspirin from a.m.  8.  Chronic microcytic anemia: Noted to be on iron supplements, previously hemoglobin 11-12, now 13-14.  DVT prophylaxis: Lovenox when okay per orthopedics Code Status: Full code Family / Patient Communication: Discussed with patient Disposition Plan:   Status is: Inpatient  Remains inpatient appropriate because:Ongoing active pain requiring inpatient pain  management, Ongoing diagnostic testing needed not appropriate for outpatient work up, Unsafe d/c plan and Inpatient level of care appropriate due to severity of illness   Dispo: The patient is from: Home              Anticipated d/c is to: May need rehab              Anticipated d/c date is: 3 days              Patient currently is not medically stable to d/c.      Time spent: 35 minutes     >50% time spent in discussions with care team and coordination of care.    Guilford Shi, MD [facility] Pager in [place]  If 7PM-7AM, please contact night-coverage [email or web address] 08/07/2020, 8:33 AM

## 2020-08-07 NOTE — ED Notes (Signed)
Pt is wiped down with CHG wipes

## 2020-08-07 NOTE — Consult Note (Signed)
Reason for Consult: Left femoral neck fracture with nondisplaced left proximal humerus fracture and nondisplaced left radial head fracture Referring Physician: Hospitalist  Marcus Downs. is an 79 y.o. male.  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  He is primarily wheelchair ambulator but does do some slight household ambulation.  He does live alone currently but has aides that come into the house 3 times a week.  He unfortunately fell and suffered a left femoral neck fracture.  He also has minimal to nondisplaced fracture of the left proximal humerus and nondisplaced fracture left radial head.  He wears a sling long-term prior to his injuries because of the inability of the left arm to function to help him.  We are consulted for management of his orthopedic injuries  Past Medical History:  Diagnosis Date  . CRI (chronic renal insufficiency)   . 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 Diabetes mellitus type II 1992  . HLD (hyperlipidemia) 10/1998  . HTN (hypertension) 10/1998  . Primary open angle glaucoma    [name]  . RBBB   . Stroke [facility])    Left sided weakness    Past Surgical History:  Procedure Laterality Date  . CATARACT EXTRACTION W/PHACO  09/09/2012   Procedure: CATARACT EXTRACTION PHACO AND INTRAOCULAR LENS PLACEMENT (IOC);  Surgeon: [name], MD;  Location: [facility] OR;  Service: Ophthalmology;  Laterality: Right;  . CATARACT EXTRACTION W/PHACO Left 03/31/2013   Procedure: CATARACT EXTRACTION PHACO AND INTRAOCULAR LENS PLACEMENT (IOC);  Surgeon: [name], MD;  Location: [facility] OR;  Service: Ophthalmology;  Laterality: Left;  . EYE SURGERY    . lipoma removal  1970's   Right shoulder  . US ECHOCARDIOGRAPHY  03/2013   mod LVH, EF 60%, normal wall motion    Family History  Problem Relation Age of Onset  . Heart attack Father 84  . Hypertension Sister        Multiple medical problems  .  Diabetes Sister   . Stroke Neg Hx   . Cancer Neg Hx     Social History:  reports that he quit smoking about 23 years ago. His smoking use included cigarettes. He has a 30.00 pack-year smoking history. He has never used smokeless tobacco. He reports that he does not drink alcohol and does not use drugs.  Allergies:  Allergies  Allergen Reactions  . Tape Rash    Burns    Medications: I have reviewed the patient's current medications.  Results for orders placed or performed during the hospital encounter of 08/06/20 (from the past 48 hour(s))  Rapid urine drug screen (hospital performed)     Status: None   Collection Time: 08/06/20  4:47 PM  Result Value Ref Range   Opiates NONE DETECTED NONE DETECTED   Cocaine NONE DETECTED NONE DETECTED   Benzodiazepines NONE DETECTED NONE DETECTED   Amphetamines NONE DETECTED NONE DETECTED   Tetrahydrocannabinol NONE DETECTED NONE DETECTED   Barbiturates NONE DETECTED NONE DETECTED    Comment: (NOTE) DRUG SCREEN FOR MEDICAL PURPOSES ONLY.  IF CONFIRMATION IS NEEDED FOR ANY PURPOSE, NOTIFY LAB WITHIN 5 DAYS.  LOWEST DETECTABLE LIMITS FOR URINE DRUG SCREEN Drug Class                     Cutoff (ng/mL) Amphetamine and metabolites    1000 Barbiturate and metabolites    200 Benzodiazepine  200 Tricyclics and metabolites     300 Opiates and metabolites        300 Cocaine and metabolites        300 THC                            50 Performed at [facility], [address] [place], [place] [address]   Urinalysis, Routine w reflex microscopic     Status: Abnormal   Collection Time: 08/06/20  4:47 PM  Result Value Ref Range   Color, Urine YELLOW YELLOW   APPearance CLEAR CLEAR   Specific Gravity, Urine 1.024 1.005 - 1.030   pH 5.0 5.0 - 8.0   Glucose, UA >=500 (A) NEGATIVE mg/dL   Hgb urine dipstick SMALL (A) NEGATIVE   Bilirubin Urine NEGATIVE NEGATIVE   Ketones, ur 20 (A) NEGATIVE mg/dL   Protein, ur  NEGATIVE NEGATIVE mg/dL   Nitrite NEGATIVE NEGATIVE   Leukocytes,Ua NEGATIVE NEGATIVE   RBC / HPF 0-5 0 - 5 RBC/hpf   WBC, UA 0-5 0 - 5 WBC/hpf   Bacteria, UA NONE SEEN NONE SEEN   Squamous Epithelial / LPF 0-5 0 - 5   Hyaline Casts, UA PRESENT     Comment: Performed at [facility], [address], [place], [place] [address]  POC CBG, ED     Status: Abnormal   Collection Time: 08/06/20  5:53 PM  Result Value Ref Range   Glucose-Capillary 418 (H) 70 - 99 mg/dL    Comment: Glucose reference range applies only to samples taken after fasting for at least 8 hours.   Comment 1 Notify RN   CBC with Differential     Status: Abnormal   Collection Time: 08/06/20  6:01 PM  Result Value Ref Range   WBC 12.4 (H) 4.0 - 10.5 K/uL   RBC 5.83 (H) 4.22 - 5.81 MIL/uL   Hemoglobin 14.9 13.0 - 17.0 g/dL   HCT 09.8 39 - 52 %   MCV 74.8 (L) 80.0 - 100.0 fL   MCH 25.6 (L) 26.0 - 34.0 pg   MCHC 34.2 30.0 - 36.0 g/dL   RDW 11.9 (H) 14.7 - 82.9 %   Platelets 223 150 - 400 K/uL   nRBC 0.0 0.0 - 0.2 %   Neutrophils Relative % 84 %   Neutro Abs 10.3 (H) 1.7 - 7.7 K/uL   Lymphocytes Relative 8 %   Lymphs Abs 1.0 0.7 - 4.0 K/uL   Monocytes Relative 8 %   Monocytes Absolute 1.0 0 - 1 K/uL   Eosinophils Relative 0 %   Eosinophils Absolute 0.0 0 - 0 K/uL   Basophils Relative 0 %   Basophils Absolute 0.0 0 - 0 K/uL   Immature Granulocytes 0 %   Abs Immature Granulocytes 0.05 0.00 - 0.07 K/uL    Comment: Performed at [facility], [address] [place], [place] [address]  Basic metabolic panel     Status: Abnormal   Collection Time: 08/06/20  6:01 PM  Result Value Ref Range   Sodium 142 135 - 145 mmol/L   Potassium 4.7 3.5 - 5.1 mmol/L   Chloride 103 98 - 111 mmol/L   CO2 19 (L) 22 - 32 mmol/L   Glucose, Bld 469 (H) 70 - 99 mg/dL    Comment: Glucose reference range applies only to samples taken after fasting for at least 8 hours.   BUN 49 (  H) 8 - 23 mg/dL    Creatinine, Ser 5.62 (H) 0.61 - 1.24 mg/dL   Calcium 8.9 8.9 - 13.0 mg/dL   GFR calc non Af Amer 37 (L) >60 mL/min   GFR calc Af Amer 43 (L) >60 mL/min   Anion gap 20 (H) 5 - 15    Comment: Performed at [facility], [address] [place], [place] [address]  Lipase, blood     Status: None   Collection Time: 08/06/20  6:01 PM  Result Value Ref Range   Lipase 19 11 - 51 U/L    Comment: Performed at [facility], [address] [place], [place] [address]  Hepatic function panel     Status: Abnormal   Collection Time: 08/06/20  6:01 PM  Result Value Ref Range   Total Protein 8.7 (H) 6.5 - 8.1 g/dL   Albumin 3.5 3.5 - 5.0 g/dL   AST 62 (H) 15 - 41 U/L   ALT 43 0 - 44 U/L   Alkaline Phosphatase 172 (H) 38 - 126 U/L   Total Bilirubin 1.5 (H) 0.3 - 1.2 mg/dL   Bilirubin, Direct 0.3 (H) 0.0 - 0.2 mg/dL   Indirect Bilirubin 1.2 (H) 0.3 - 0.9 mg/dL    Comment: Performed at [facility], [address], [place], [place] [address]  Ethanol     Status: None   Collection Time: 08/06/20  6:01 PM  Result Value Ref Range   Alcohol, Ethyl (B) <10 <10 mg/dL    Comment: (NOTE) Lowest detectable limit for serum alcohol is 10 mg/dL.  For medical purposes only. Performed at [facility], [address] [place], [place] [address]   Lactic acid, plasma     Status: Abnormal   Collection Time: 08/06/20  6:01 PM  Result Value Ref Range   Lactic Acid, Venous 2.5 (HH) 0.5 - 1.9 mmol/L    Comment: CRITICAL RESULT CALLED TO, READ BACK BY AND VERIFIED WITH: Melford Aase [id number] @[id number]  BY V.WILKINS Performed at [facility], [address] [place], [place]   SARS Coronavirus 2 by RT PCR (hospital order, performed in [facility] hospital lab) Nasopharyngeal Nasopharyngeal Swab     Status: None   Collection Time: 08/06/20  6:01 PM   Specimen: Nasopharyngeal Swab  Result Value Ref Range   SARS  Coronavirus 2 NEGATIVE NEGATIVE    Comment: (NOTE) SARS-CoV-2 target nucleic acids are NOT DETECTED.  The SARS-CoV-2 RNA is generally detectable in upper and lower respiratory specimens during the acute phase of infection. The lowest concentration of SARS-CoV-2 viral copies this assay can detect is 250 copies / mL. A negative result does not preclude SARS-CoV-2 infection and should not be used as the sole basis for treatment or other patient management decisions.  A negative result may occur with improper specimen collection / handling, submission of specimen other than nasopharyngeal swab, presence of viral mutation(s) within the areas targeted by this assay, and inadequate number of viral copies (<250 copies / mL). A negative result must be combined with clinical observations, patient history, and epidemiological information.  Fact Sheet for Patients:   08/08/20  Fact Sheet for Healthcare Providers: BoilerBrush.com.cy  This test is not yet approved or  cleared by the https://pope.com/ FDA and has been authorized for detection and/or diagnosis of SARS-CoV-2 by FDA under an Emergency Use Authorization (EUA).  This EUA will remain in effect (meaning this test can be used) for  the duration of the COVID-19 declaration under Section 564(b)(1) of the Act, 21 U.S.C. section 360bbb-3(b)(1), unless the authorization is terminated or revoked sooner.  Performed at [facility], [address] [place], [place] [address]   Hemoglobin A1c     Status: Abnormal   Collection Time: 08/06/20  6:01 PM  Result Value Ref Range   Hgb A1c MFr Bld 7.7 (H) 4.8 - 5.6 %    Comment: (NOTE) Pre diabetes:          5.7%-6.4%  Diabetes:              >6.4%  Glycemic control for   <7.0% adults with diabetes    Mean Plasma Glucose 174.29 mg/dL    Comment: Performed at [facility], [address] [place], [place] [address]   I-stat chem 8, ED (not at [facility] or [facility])     Status: Abnormal   Collection Time: 08/06/20  6:08 PM  Result Value Ref Range   Sodium 145 135 - 145 mmol/L   Potassium 4.4 3.5 - 5.1 mmol/L   Chloride 109 98 - 111 mmol/L   BUN 45 (H) 8 - 23 mg/dL   Creatinine, Ser 5.95 (H) 0.61 - 1.24 mg/dL   Glucose, Bld 638 (H) 70 - 99 mg/dL    Comment: Glucose reference range applies only to samples taken after fasting for at least 8 hours.   Calcium, Ion 1.17 1.15 - 1.40 mmol/L   TCO2 20 (L) 22 - 32 mmol/L   Hemoglobin 15.3 13.0 - 17.0 g/dL   HCT 75.6 39 - 52 %  Blood gas, venous (at [facility] and [facility], not at [facility])     Status: Abnormal   Collection Time: 08/06/20  6:15 PM  Result Value Ref Range   pH, Ven 7.333 7.25 - 7.43   pCO2, Ven 39.6 (L) 44 - 60 mmHg   pO2, Ven 44.9 32 - 45 mmHg   Bicarbonate 20.5 20.0 - 28.0 mmol/L   Acid-base deficit 4.5 (H) 0.0 - 2.0 mmol/L   O2 Saturation 69.6 %   Patient temperature 98.6     Comment: Performed at [facility], [address] [place], [place] [address]  POC CBG, ED     Status: Abnormal   Collection Time: 08/06/20  9:34 PM  Result Value Ref Range   Glucose-Capillary 342 (H) 70 - 99 mg/dL    Comment: Glucose reference range applies only to samples taken after fasting for at least 8 hours.  Basic metabolic panel     Status: Abnormal   Collection Time: 08/07/20  5:00 AM  Result Value Ref Range   Sodium 154 (H) 135 - 145 mmol/L    Comment: DELTA CHECK NOTED   Potassium 3.9 3.5 - 5.1 mmol/L   Chloride 114 (H) 98 - 111 mmol/L   CO2 24 22 - 32 mmol/L   Glucose, Bld 186 (H) 70 - 99 mg/dL    Comment: Glucose reference range applies only to samples taken after fasting for at least 8 hours.   BUN 36 (H) 8 - 23 mg/dL   Creatinine, Ser 5.18 (H) 0.61 - 1.24 mg/dL   Calcium 8.9 8.9 - 84.1 mg/dL   GFR calc non Af Amer 45 (L) >60 mL/min   GFR calc Af Amer 53 (L) >60 mL/min   Anion gap 16 (H) 5 - 15    Comment: Performed at [facility], [address], [place], [place] [address]  CBC     Status: Abnormal   Collection Time: 08/07/20  5:00 AM  Result Value Ref Range   WBC 11.2 (H) 4.0 - 10.5 K/uL   RBC 5.18 4.22 - 5.81 MIL/uL   Hemoglobin 13.2 13.0 - 17.0 g/dL   HCT 16.1 (L) 39 - 52 %   MCV 75.1 (L) 80.0 - 100.0 fL   MCH 25.5 (L) 26.0 - 34.0 pg   MCHC 33.9 30.0 - 36.0 g/dL   RDW 09.6 (H) 04.5 - 40.9 %   Platelets 203 150 - 400 K/uL   nRBC 0.0 0.0 - 0.2 %    Comment: Performed at [facility], [address] [place], [place] [address]    DG Chest 1 View  Result Date: 08/06/2020 CLINICAL DATA:  Left leg pain related to fall. EXAM: CHEST  1 VIEW COMPARISON:  October 30, 2013 FINDINGS: The heart, hila, and mediastinum are normal. No pneumothorax. Minimal atelectasis in the left base. The lungs are otherwise clear. No other acute abnormalities. IMPRESSION: No active disease. Electronically Signed   By: Gerome Sam III M.D   On: 08/06/2020 18:08   DG Pelvis 1-2 Views  Result Date: 08/06/2020 CLINICAL DATA:  Pain after fall EXAM: PELVIS - 1-2 VIEW COMPARISON:  None. FINDINGS: A subcapital fracture seen to the left hip. Evaluation for dislocation is limited as only an AP view was obtained. No definite dislocation noted. No other bony abnormalities are identified. IMPRESSION: Subcapital fracture through the proximal left hip. Evaluation for dislocation is limited without a lateral view but there is no evidence of dislocation on this study. Electronically Signed   By: Gerome Sam III M.D   On: 08/06/2020 18:09   DG Elbow Complete Left  Result Date: 08/06/2020 CLINICAL DATA:  Pain after fall EXAM: LEFT ELBOW - COMPLETE 3+ VIEW COMPARISON:  None. FINDINGS: Evaluation is limited due to positioning. The proximal ulna and radius are not well visualized on the first 3 images as a overlap the humerus. No joint effusion is seen on the lateral view. Mild irregularity of the radial head on the lateral  view suggest the possibility of a subtle fracture. No other abnormalities. IMPRESSION: Evaluation is limited due to positioning as above. Irregularity of the radial head on the lateral view is concerning for a subtle fracture. No joint effusion identified. Electronically Signed   By: Gerome Sam III M.D   On: 08/06/2020 18:07   CT Head Wo Contrast  Result Date: 08/06/2020 CLINICAL DATA:  Status post fall. EXAM: CT HEAD WITHOUT CONTRAST TECHNIQUE: Contiguous axial images were obtained from the base of the skull through the vertex without intravenous contrast. COMPARISON:  October 30, 2013 FINDINGS: Brain: There is mild to moderate severity cerebral atrophy with widening of the extra-axial spaces and ventricular dilatation. There are areas of decreased attenuation within the white matter tracts of the supratentorial brain, consistent with microvascular disease changes. A large area of cortical encephalomalacia, with adjacent chronic white matter low attenuation, is seen throughout the right hemisphere. This involves predominantly the right parietal lobe and is unchanged in appearance when compared to the prior study. Vascular: No hyperdense vessel or unexpected calcification. Skull: Normal. Negative for fracture or focal lesion. Sinuses/Orbits: No acute finding. Other: None. IMPRESSION: 1. No acute intracranial abnormality. 2. Generalized cerebral atrophy. 3. Chronic predominant right parietal lobe infarct. Electronically Signed   By: Aram Candela M.D.   On: 08/06/2020 19:00   CT Cervical Spine Wo Contrast  Result Date:  08/06/2020 CLINICAL DATA:  Left leg pain related to fall EXAM: CT CERVICAL SPINE WITHOUT CONTRAST TECHNIQUE: Multidetector CT imaging of the cervical spine was performed without intravenous contrast. Multiplanar CT image reconstructions were also generated. COMPARISON:  None. FINDINGS: Alignment: There is straightening of the normal cervical lordosis. Skull base and vertebrae:  Visualized skull base is intact. No atlanto-occipital dissociation. The vertebral body heights are well maintained. No fracture or pathologic osseous lesion seen. Soft tissues and spinal canal: The visualized paraspinal soft tissues are unremarkable. No prevertebral soft tissue swelling is seen. The spinal canal is grossly unremarkable, no large epidural collection or significant canal narrowing. Disc levels: Multilevel cervical spine spondylosis is seen with anterior osteophytes, disc osteophyte complex and uncovertebral osteophytes most notable C5-C6 and C6-C7 with moderate neural foraminal narrowing and mild central canal stenosis. Upper chest: The lung apices are clear. Thoracic inlet is within normal limits. Other: None IMPRESSION: No acute fracture or malalignment of the spine. 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 Electronically Signed   By: Jonna Clark M.D.   On: 08/06/2020 19:25   DG Shoulder Left  Addendum Date: 08/06/2020   ADDENDUM REPORT: 08/06/2020 18:05 ADDENDUM: Upon reviewing the left humeral images, there appears to be a subtle a fracture through the neck of the left humerus only seen on the true AP view. This finding was described on the humeral images. Electronically Signed   By: Gerome Sam III M.D   On: 08/06/2020 18:05   Result Date: 08/06/2020 CLINICAL DATA:  Pain after fall EXAM: LEFT SHOULDER - 2+ VIEW COMPARISON:  None. FINDINGS: Irregularity along the inferior aspect of the glenoid suggest the possibility of previous trauma. No acute fractures or dislocations are seen on today's study. Limited views of the chest are normal. IMPRESSION: Negative. Electronically Signed: By: Gerome Sam III M.D On: 08/06/2020 18:01   DG Humerus Left  Result Date: 08/06/2020 CLINICAL DATA:  Pain after fall EXAM: LEFT HUMERUS - 2+ VIEW COMPARISON:  None. FINDINGS: There is a subtle lucency extending through the neck of the left humerus only seen on the final image. In  retrospect, this is also seen on 1 of the images of the left shoulder but is very subtle. No other fractures. IMPRESSION: Subtle nondisplaced fracture through the neck of the humerus only seen on 1 image of the humeral films and 1 image of the left shoulder films. The finding is subtle but appears to be real. Electronically Signed   By: Gerome Sam III M.D   On: 08/06/2020 18:04   DG Femur Min 2 Views Left  Result Date: 08/06/2020 CLINICAL DATA:  Pain after fall. EXAM: LEFT FEMUR 2 VIEWS COMPARISON:  None. FINDINGS: Only the distal half of the femur was imaged on the lateral view. Only the distal third of the femur was imaged on the AP view. The proximal half of the femur was imaged on the lateral view. Given the lack of visualization of the entire femur in all three views, evaluation is limited. Within these limitations, no fractures are seen. IMPRESSION: Only a portion of the femur was seen in each of the three views which limits evaluation. Within these limitations, no fractures are seen. Electronically Signed   By: Gerome Sam III M.D   On: 08/06/2020 18:00    ROS  ROS: I have reviewed the patient's review of systems thoroughly and there are no positive responses as relates to the HPI. Blood pressure (!) 144/91, pulse (!) 128, temperature  99.1 F (37.3 C), temperature source Oral, resp. rate (!) 23, height 5' 11.5" (1.816 m), weight 87.5 kg, SpO2 98 %. Physical Exam Well-developed well-nourished patient in no acute distress. Alert and oriented x3 HEENT:within normal limits Cardiac: Regular rate and rhythm Pulmonary: Lungs clear to auscultation Abdomen: Soft and nontender.  Normal active bowel sounds  Musculoskeletal: Left upper extremity has normal sensation.  He has really inability to flex and extend the arm at the elbow.  He has minimal movement of the left upper extremity.  He has good distal pulses.  Left lower extremity is externally rotated and shortened.  He has pain with range  of motion here.  He has poor muscle tone and function of the extremity though difficult to assess given the pain from his hip fracture. Assessment/Plan: 79 year old primarily wheelchair ambulator who fell and broke his left femoral neck.  He has concomitant nondisplaced fracture of his left proximal humerus and radial head of the left upper extremity as well.//We had a long discussion of his overall situation and potential treatment options.  The patient will benefit from hemiarthroplasty left hip to allow him to sit and transfer without significant limitations or pain.  I do not think he would do well with nonoperative management.  I talked to the patient and he is well aware of the complexity of the situation.  He is aware of the risks of bleeding, infection, dislocation, need for further surgery, and the slight risk of death and around the time of surgery.  He does wish to proceed without surgical intervention.  Based on the current OR situation I think the surgery will be done somewhere around 3-3 30 today.  Harvie JuniorJohn L Kiora Downs 08/07/2020, 8:08 AM  [phone]

## 2020-08-07 NOTE — Transfer of Care (Signed)
Immediate Anesthesia Transfer of Care Note  Patient: Marcus Downs.  Procedure(s) Performed: ARTHROPLASTY BIPOLAR HIP (HEMIARTHROPLASTY) (Left Hip)  Patient Location: PACU  Anesthesia Type:General  Level of Consciousness: sedated, patient cooperative and responds to stimulation  Airway & Oxygen Therapy: Patient Spontanous Breathing and Patient connected to face mask oxygen  Post-op Assessment: Report given to RN, Post -op Vital signs reviewed and stable and Dr. Mal Amabile notified of HR 140 in PACU  Post vital signs: Reviewed and stable  Last Vitals:  Vitals Value Taken Time  BP 122/75 08/07/20 1724  Temp 36.7 C 08/07/20 1722  Pulse 147 08/07/20 1725  Resp 17 08/07/20 1725  SpO2 97 % 08/07/20 1725  Vitals shown include unvalidated device data.  Last Pain:  Vitals:   08/07/20 1414  TempSrc:   PainSc: 0-No pain         Complications: No complications documented.

## 2020-08-07 NOTE — Progress Notes (Signed)
Patient in Afib/RVR with uncontrolled rate in 130s. This RN paged [facility] and is awaiting orders. RN will continue to monitor.

## 2020-08-07 NOTE — Brief Op Note (Signed)
08/06/2020 - 08/07/2020  4:32 PM  PATIENT:  Marcus Downs.  79 y.o. male  PRE-OPERATIVE DIAGNOSIS:  Left femoral neck fracture  POST-OPERATIVE DIAGNOSIS:  Left femoral neck fracture  PROCEDURE:  Procedure(s): ARTHROPLASTY BIPOLAR HIP (HEMIARTHROPLASTY) (Left)  SURGEON:  Surgeon(s) and Role:    [name], MD - Primary  PHYSICIAN ASSISTANT:   ASSISTANTS: Greggory Stallion   ANESTHESIA:   general  EBL:  100cc   BLOOD ADMINISTERED:none  DRAINS: none   LOCAL MEDICATIONS USED:  MARCAINE     SPECIMEN:  No Specimen  DISPOSITION OF SPECIMEN:  N/A  COUNTS:  YES  TOURNIQUET:  * No tourniquets in log *  DICTATION: .Other Dictation: Dictation Number [id number]  PLAN OF CARE: Admit to inpatient   PATIENT DISPOSITION:  PACU - hemodynamically stable.   Delay start of Pharmacological VTE agent (>24hrs) due to surgical blood loss or risk of bleeding: no

## 2020-08-08 ENCOUNTER — Inpatient Hospital Stay (HOSPITAL_COMMUNITY): Payer: Medicare Other

## 2020-08-08 ENCOUNTER — Encounter (HOSPITAL_COMMUNITY): Payer: Self-pay | Admitting: Orthopedic Surgery

## 2020-08-08 DIAGNOSIS — I479 Paroxysmal tachycardia, unspecified: Secondary | ICD-10-CM

## 2020-08-08 DIAGNOSIS — I340 Nonrheumatic mitral (valve) insufficiency: Secondary | ICD-10-CM

## 2020-08-08 DIAGNOSIS — R9431 Abnormal electrocardiogram [ECG] [EKG]: Secondary | ICD-10-CM

## 2020-08-08 LAB — BASIC METABOLIC PANEL
Anion gap: 13 (ref 5–15)
BUN: 24 mg/dL — ABNORMAL HIGH (ref 8–23)
CO2: 26 mmol/L (ref 22–32)
Calcium: 8.7 mg/dL — ABNORMAL LOW (ref 8.9–10.3)
Chloride: 112 mmol/L — ABNORMAL HIGH (ref 98–111)
Creatinine, Ser: 1.13 mg/dL (ref 0.61–1.24)
GFR calc Af Amer: >60 mL/min (ref >60)
GFR calc non Af Amer: >60 mL/min (ref >60)
Glucose, Bld: 119 mg/dL — ABNORMAL HIGH (ref 70–99)
Potassium: 3.9 mmol/L (ref 3.5–5.1)
Sodium: 151 mmol/L — ABNORMAL HIGH (ref 135–145)

## 2020-08-08 LAB — ECHOCARDIOGRAM LIMITED
Height: 71.5 in
Weight: 2751.34 oz

## 2020-08-08 LAB — CBC
HCT: 40.5 % (ref 39.0–52.0)
Hemoglobin: 13.7 g/dL (ref 13.0–17.0)
MCH: 25.6 pg — ABNORMAL LOW (ref 26.0–34.0)
MCHC: 33.8 g/dL (ref 30.0–36.0)
MCV: 75.6 fL — ABNORMAL LOW (ref 80.0–100.0)
Platelets: 190 10*3/uL (ref 150–400)
RBC: 5.36 MIL/uL (ref 4.22–5.81)
RDW: 20.5 % — ABNORMAL HIGH (ref 11.5–15.5)
WBC: 10.3 10*3/uL (ref 4.0–10.5)
nRBC: 0 % (ref 0.0–0.2)

## 2020-08-08 LAB — GLUCOSE, CAPILLARY
Glucose-Capillary: 176 mg/dL — ABNORMAL HIGH (ref 70–99)
Glucose-Capillary: 174 mg/dL — ABNORMAL HIGH (ref 70–99)
Glucose-Capillary: 217 mg/dL — ABNORMAL HIGH (ref 70–99)
Glucose-Capillary: 183 mg/dL — ABNORMAL HIGH (ref 70–99)

## 2020-08-08 MED ORDER — DILTIAZEM HCL 25 MG/5ML IV SOLN
10.0000 mg | Freq: Once | INTRAVENOUS | Status: AC
  Administered 2020-08-08: 10 mg via INTRAVENOUS
  Filled 2020-08-08: qty 5

## 2020-08-08 MED ORDER — METOPROLOL TARTRATE 5 MG/5ML IV SOLN
2.5000 mg | Freq: Four times a day (QID) | INTRAVENOUS | Status: DC | PRN
  Administered 2020-08-08: 2.5 mg via INTRAVENOUS
  Filled 2020-08-08: qty 5

## 2020-08-08 MED ORDER — DEXTROSE 5 % IV SOLN: INTRAVENOUS | Status: DC

## 2020-08-08 MED ORDER — AMIODARONE HCL IN DEXTROSE 360-4.14 MG/200ML-% IV SOLN
60.0000 mg/h | INTRAVENOUS | Status: AC
  Administered 2020-08-08: 60 mg/h via INTRAVENOUS
  Filled 2020-08-08 (×2): qty 200

## 2020-08-08 MED ORDER — METOPROLOL TARTRATE 5 MG/5ML IV SOLN
2.5000 mg | Freq: Four times a day (QID) | INTRAVENOUS | Status: DC
  Administered 2020-08-08 – 2020-08-09 (×4): 2.5 mg via INTRAVENOUS
  Filled 2020-08-08 (×4): qty 5

## 2020-08-08 MED ORDER — AMIODARONE HCL IN DEXTROSE 360-4.14 MG/200ML-% IV SOLN
30.0000 mg/h | INTRAVENOUS | Status: DC
  Administered 2020-08-08 – 2020-08-09 (×3): 30 mg/h via INTRAVENOUS
  Filled 2020-08-08 (×2): qty 200

## 2020-08-08 MED ORDER — DILTIAZEM HCL-DEXTROSE 125-5 MG/125ML-% IV SOLN (PREMIX)
5.0000 mg/h | INTRAVENOUS | Status: DC
  Administered 2020-08-08: 5 mg/h via INTRAVENOUS
  Filled 2020-08-08: qty 125

## 2020-08-08 MED ORDER — NYSTATIN 100000 UNIT/ML MT SUSP
5.0000 mL | Freq: Four times a day (QID) | OROMUCOSAL | Status: DC
  Administered 2020-08-08 – 2020-08-14 (×18): 500000 [IU] via ORAL
  Filled 2020-08-08 (×20): qty 5

## 2020-08-08 NOTE — Consult Note (Signed)
Cardiology Consultation:   Patient ID: Marcus Downs. MRN: [id number]; DOB: [birth year]  Admit date: 08/06/2020 Date of Consult: 08/08/2020  Primary Care Provider: [name], [facility] Cardiologist: Dr Jens Som    Patient Profile:   Marcus Downs. is a 79 y.o. male with a hx of prior CVA, hypertension, diabetes mellitus, hyperlipidemia, chronic renal insufficiency admitted with femoral neck fracture who is being seen today for the evaluation of tachycardia at the request of Chilton Greathouse MD.  History of Present Illness:   Echocardiogram 2014 showed normal LV function, moderate left ventricular hypertrophy.  Patient admitted on September 19 after a mechanical fall at home.  He was found to have left femur fracture as well as nondisplaced fracture of the left humerus.  He underwent left femoral neck fracture repair on September 20.  Postoperatively he has been noted to be tachycardic and cardiology asked to evaluate.  At time of my evaluation patient is somewhat confused and does not know where he is.  He does not know its 2021.  He denies dyspnea, chest pain, palpitations.  He also states he did not have syncope when he fell but merely lost his balance.   Past Medical History:  Diagnosis Date  . CRI (chronic renal insufficiency)   . 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 Diabetes mellitus type II 1992  . HLD (hyperlipidemia) 10/1998  . HTN (hypertension) 10/1998  . Primary open angle glaucoma    [name]  . RBBB   . Stroke [facility])    Left sided weakness    Past Surgical History:  Procedure Laterality Date  . CATARACT EXTRACTION W/PHACO  09/09/2012   Procedure: CATARACT EXTRACTION PHACO AND INTRAOCULAR LENS PLACEMENT (IOC);  Surgeon: [name], MD;  Location: [facility] OR;  Service: Ophthalmology;  Laterality: Right;  . CATARACT EXTRACTION W/PHACO Left 03/31/2013   Procedure: CATARACT EXTRACTION PHACO AND INTRAOCULAR LENS PLACEMENT  (IOC);  Surgeon: [name], MD;  Location: [facility] OR;  Service: Ophthalmology;  Laterality: Left;  . EYE SURGERY    . lipoma removal  1970's   Right shoulder  . US ECHOCARDIOGRAPHY  03/2013   mod LVH, EF 60%, normal wall motion      Inpatient Medications: Scheduled Meds: . aspirin EC  325 mg Oral BID PC  . atorvastatin  40 mg Oral Daily  . Chlorhexidine Gluconate Cloth  6 each Topical Daily  . docusate sodium  100 mg Oral BID  . ferrous sulfate  325 mg Oral BID WC  . hydrALAZINE  25 mg Oral BID  . insulin aspart  0-20 Units Subcutaneous TID PC & HS  . insulin aspart protamine- aspart  25 Units Subcutaneous BID WC  . latanoprost  1 drop Both Eyes QHS  . mouth rinse  15 mL Mouth Rinse BID   Continuous Infusions: . amiodarone 30 mg/hr (08/08/20 0640)  . dextrose 50 mL/hr at 08/08/20 0813  . diltiazem (CARDIZEM) infusion 5 mg/hr (08/08/20 0948)  . methocarbamol (ROBAXIN) IV     PRN Meds: acetaminophen, alum & mag hydroxide-simeth, HYDROmorphone (DILAUDID) injection, magnesium hydroxide, methocarbamol **OR** methocarbamol (ROBAXIN) IV, metoCLOPramide **OR** metoCLOPramide (REGLAN) injection, metoprolol tartrate, morphine injection, ondansetron **OR** ondansetron (ZOFRAN) IV, oxyCODONE, oxyCODONE, traZODone  Allergies:    Allergies  Allergen Reactions  . Tape Rash    Burns    Social History:   Social History   Socioeconomic History  . Marital status: Divorced    Spouse name: Not on  file  . Number of children: 2  . Years of education: Not on file  . Highest education level: Not on file  Occupational History  . Occupation: Retired medically from [employer]  Tobacco Use  . Smoking status: Former Smoker    Packs/day: 1.00    Years: 30.00    Pack years: 30.00    Types: Cigarettes    Quit date: 09/07/1996    Years since quitting: 23.9  . Smokeless tobacco: Never Used  . Tobacco comment: quit after CVA  Vaping Use  . Vaping Use: Never used  Substance and Sexual  Activity  . Alcohol use: No  . Drug use: No  . Sexual activity: Not Currently  Other Topics Concern  . Not on file  Social History Narrative   Caffeine: neg   Lives alone. Friend comes and helps sometimes. Son's brother in law helps with medicines. Family nearby - 2 sisters and nieces/nephews in area.   Sons live on other side of [place]       Ophtho - Dr. Harlon Flor at [facility] of [employer]   Social Determinants of Health   Financial Resource Strain:   . Difficulty of Paying Living Expenses: Not on file  Food Insecurity:   . Worried About Programme researcher, broadcasting/film/video in the Last Year: Not on file  . Ran Out of Food in the Last Year: Not on file  Transportation Needs:   . Lack of Transportation (Medical): Not on file  . Lack of Transportation (Non-Medical): Not on file  Physical Activity:   . Days of Exercise per Week: Not on file  . Minutes of Exercise per Session: Not on file  Stress:   . Feeling of Stress : Not on file  Social Connections:   . Frequency of Communication with Friends and Family: Not on file  . Frequency of Social Gatherings with Friends and Family: Not on file  . Attends Religious Services: Not on file  . Active Member of Clubs or Organizations: Not on file  . Attends Banker Meetings: Not on file  . Marital Status: Not on file  Intimate Partner Violence:   . Fear of Current or Ex-Partner: Not on file  . Emotionally Abused: Not on file  . Physically Abused: Not on file  . Sexually Abused: Not on file    Family History:    Family History  Problem Relation Age of Onset  . Heart attack Father 8  . Hypertension Sister        Multiple medical problems  . Diabetes Sister   . Stroke Neg Hx   . Cancer Neg Hx      ROS:  Please see the history of present illness.  Patient somewhat confused.  However he denies chest pain or dyspnea.  He states his left lower extremity pain has improved following surgery.  No fevers, chills, productive  cough. All other ROS reviewed and negative.     Physical Exam/Data:   Vitals:   08/08/20 0645 08/08/20 0700 08/08/20 0800 08/08/20 0834  BP:  (!) 144/74 110/76   Pulse:      Resp: (!) 23 (!) 25 12   Temp:   100 F (37.8 C)   TempSrc:   Oral   SpO2:    100%  Weight:      Height:        Intake/Output Summary (Last 24 hours) at 08/08/2020 0958 Last data filed at 08/08/2020 0840 Gross per 24 hour  Intake  1331.07 ml  Output 1850 ml  Net -518.93 ml   Last 3 Weights 08/07/2020 08/07/2020 08/06/2020  Weight (lbs) 171 lb 15.3 oz 192 lb 14.4 oz 193 lb  Weight (kg) 78 kg 87.5 kg 87.544 kg     Body mass index is 23.65 kg/m.  General:  Well nourished, well developed, in no acute distress HEENT: normal Lymph: no adenopathy Neck: no JVD Endocrine:  No thryomegaly Vascular: No carotid bruits; FA pulses 2+ bilaterally without bruits  Cardiac: Tachycardic, irregular, no murmurs noted. Lungs:  clear to auscultation bilaterally, no wheezing, rhonchi or rales  Abd: soft, nontender, no hepatomegaly  Ext: no edema Musculoskeletal:  No deformities Skin: warm and dry  Neuro: Patient has residual weakness of left upper extremity and left lower extremity following previous CVA. Psych:  Normal affect; somewhat confused.  EKG:  The EKG was personally reviewed and demonstrates: Sinus tachycardia with PVCs in a pattern of bigeminy, right bundle branch block, cannot rule out inferior infarct. Telemetry:  Telemetry was personally reviewed and demonstrates: Probable sinus tachycardia with runs of PAT, aberrancy and PVCs.  Laboratory Data:  High Sensitivity Troponin:   Recent Labs  Lab 08/07/20 1918  TROPONINIHS 38*     Chemistry Recent Labs  Lab 08/07/20 1058 08/07/20 1918 08/08/20 0026  NA 150* 153* 151*  K 3.7 4.5 3.9  CL 112* 109 112*  CO2 22 25 26   GLUCOSE 183* 108* 119*  BUN 31* 28* 24*  CREATININE 1.22 1.26* 1.13  CALCIUM 8.3* 8.9 8.7*  GFRNONAA 56* 54* >60  GFRAA >60 >60 >60    ANIONGAP 16* 19* 13    Recent Labs  Lab 08/06/20 1801  PROT 8.7*  ALBUMIN 3.5  AST 62*  ALT 43  ALKPHOS 172*  BILITOT 1.5*   Hematology Recent Labs  Lab 08/06/20 1801 08/06/20 1801 08/06/20 1808 08/07/20 0500 08/08/20 0026  WBC 12.4*  --   --  11.2* 10.3  RBC 5.83*  --   --  5.18 5.36  HGB 14.9   < > 15.3 13.2 13.7  HCT 43.6   < > 45.0 38.9* 40.5  MCV 74.8*  --   --  75.1* 75.6*  MCH 25.6*  --   --  25.5* 25.6*  MCHC 34.2  --   --  33.9 33.8  RDW 20.9*  --   --  20.1* 20.5*  PLT 223  --   --  203 190   < > = values in this interval not displayed.    Radiology/Studies:  DG Chest 1 View  Result Date: 08/06/2020 CLINICAL DATA:  Left leg pain related to fall. EXAM: CHEST  1 VIEW COMPARISON:  October 30, 2013 FINDINGS: The heart, hila, and mediastinum are normal. No pneumothorax. Minimal atelectasis in the left base. The lungs are otherwise clear. No other acute abnormalities. IMPRESSION: No active disease. Electronically Signed   By: November 01, 2013 III M.D   On: 08/06/2020 18:08   DG Pelvis 1-2 Views  Result Date: 08/06/2020 CLINICAL DATA:  Pain after fall EXAM: PELVIS - 1-2 VIEW COMPARISON:  None. FINDINGS: A subcapital fracture seen to the left hip. Evaluation for dislocation is limited as only an AP view was obtained. No definite dislocation noted. No other bony abnormalities are identified. IMPRESSION: Subcapital fracture through the proximal left hip. Evaluation for dislocation is limited without a lateral view but there is no evidence of dislocation on this study. Electronically Signed   By: 08/08/2020 III M.D  On: 08/06/2020 18:09   DG Elbow Complete Left  Result Date: 08/06/2020 CLINICAL DATA:  Pain after fall EXAM: LEFT ELBOW - COMPLETE 3+ VIEW COMPARISON:  None. FINDINGS: Evaluation is limited due to positioning. The proximal ulna and radius are not well visualized on the first 3 images as a overlap the humerus. No joint effusion is seen on the lateral view.  Mild irregularity of the radial head on the lateral view suggest the possibility of a subtle fracture. No other abnormalities. IMPRESSION: Evaluation is limited due to positioning as above. Irregularity of the radial head on the lateral view is concerning for a subtle fracture. No joint effusion identified. Electronically Signed   By: Gerome Samavid  Williams III M.D   On: 08/06/2020 18:07   CT Head Wo Contrast  Result Date: 08/06/2020 CLINICAL DATA:  Status post fall. EXAM: CT HEAD WITHOUT CONTRAST TECHNIQUE: Contiguous axial images were obtained from the base of the skull through the vertex without intravenous contrast. COMPARISON:  October 30, 2013 FINDINGS: Brain: There is mild to moderate severity cerebral atrophy with widening of the extra-axial spaces and ventricular dilatation. There are areas of decreased attenuation within the white matter tracts of the supratentorial brain, consistent with microvascular disease changes. A large area of cortical encephalomalacia, with adjacent chronic white matter low attenuation, is seen throughout the right hemisphere. This involves predominantly the right parietal lobe and is unchanged in appearance when compared to the prior study. Vascular: No hyperdense vessel or unexpected calcification. Skull: Normal. Negative for fracture or focal lesion. Sinuses/Orbits: No acute finding. Other: None. IMPRESSION: 1. No acute intracranial abnormality. 2. Generalized cerebral atrophy. 3. Chronic predominant right parietal lobe infarct. Electronically Signed   By: Aram Candelahaddeus  Houston M.D.   On: 08/06/2020 19:00   CT Cervical Spine Wo Contrast  Result Date: 08/06/2020 CLINICAL DATA:  Left leg pain related to fall EXAM: CT CERVICAL SPINE WITHOUT CONTRAST TECHNIQUE: Multidetector CT imaging of the cervical spine was performed without intravenous contrast. Multiplanar CT image reconstructions were also generated. COMPARISON:  None. FINDINGS: Alignment: There is straightening of the normal  cervical lordosis. Skull base and vertebrae: Visualized skull base is intact. No atlanto-occipital dissociation. The vertebral body heights are well maintained. No fracture or pathologic osseous lesion seen. Soft tissues and spinal canal: The visualized paraspinal soft tissues are unremarkable. No prevertebral soft tissue swelling is seen. The spinal canal is grossly unremarkable, no large epidural collection or significant canal narrowing. Disc levels: Multilevel cervical spine spondylosis is seen with anterior osteophytes, disc osteophyte complex and uncovertebral osteophytes most notable C5-C6 and C6-C7 with moderate neural foraminal narrowing and mild central canal stenosis. Upper chest: The lung apices are clear. Thoracic inlet is within normal limits. Other: None IMPRESSION: No acute fracture or malalignment of the spine. 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 Electronically Signed   By: Jonna ClarkBindu  Avutu M.D.   On: 08/06/2020 19:25   Pelvis Portable  Result Date: 08/07/2020 CLINICAL DATA:  Status post left hip replacement EXAM: PORTABLE PELVIS 1 VIEWS COMPARISON:  08/06/2020 FINDINGS: Left hip prosthesis is now seen. No acute fracture or dislocation is seen. No soft tissue abnormality is noted. IMPRESSION: Status post left hip replacement Electronically Signed   By: Alcide CleverMark  Lukens M.D.   On: 08/07/2020 19:42   DG Shoulder Left  Addendum Date: 08/06/2020   ADDENDUM REPORT: 08/06/2020 18:05 ADDENDUM: Upon reviewing the left humeral images, there appears to be a subtle a fracture through the neck of the left humerus only  seen on the true AP view. This finding was described on the humeral images. Electronically Signed   By: Gerome Sam III M.D   On: 08/06/2020 18:05   Result Date: 08/06/2020 CLINICAL DATA:  Pain after fall EXAM: LEFT SHOULDER - 2+ VIEW COMPARISON:  None. FINDINGS: Irregularity along the inferior aspect of the glenoid suggest the possibility of previous trauma. No acute  fractures or dislocations are seen on today's study. Limited views of the chest are normal. IMPRESSION: Negative. Electronically Signed: By: Gerome Sam III M.D On: 08/06/2020 18:01   DG Humerus Left  Result Date: 08/06/2020 CLINICAL DATA:  Pain after fall EXAM: LEFT HUMERUS - 2+ VIEW COMPARISON:  None. FINDINGS: There is a subtle lucency extending through the neck of the left humerus only seen on the final image. In retrospect, this is also seen on 1 of the images of the left shoulder but is very subtle. No other fractures. IMPRESSION: Subtle nondisplaced fracture through the neck of the humerus only seen on 1 image of the humeral films and 1 image of the left shoulder films. The finding is subtle but appears to be real. Electronically Signed   By: Gerome Sam III M.D   On: 08/06/2020 18:04   DG Femur Min 2 Views Left  Result Date: 08/06/2020 CLINICAL DATA:  Pain after fall. EXAM: LEFT FEMUR 2 VIEWS COMPARISON:  None. FINDINGS: Only the distal half of the femur was imaged on the lateral view. Only the distal third of the femur was imaged on the AP view. The proximal half of the femur was imaged on the lateral view. Given the lack of visualization of the entire femur in all three views, evaluation is limited. Within these limitations, no fractures are seen. IMPRESSION: Only a portion of the femur was seen in each of the three views which limits evaluation. Within these limitations, no fractures are seen. Electronically Signed   By: Gerome Sam III M.D   On: 08/06/2020 18:00    Assessment and Plan:   1. Tachycardia-I have personally reviewed the patient's telemetry and ECGs.  I am not convinced I see atrial fibrillation at this point.  He appears to have sinus tachycardia with frequent PVCs and runs of PAT.  He is asymptomatic and not having palpitations.  This is likely being driven by recent femoral neck fracture and humerus fracture associated with pain.  He also appears to be somewhat  dehydrated.  I agree with echocardiogram to assess LV function.  We can continue amiodarone for now but will likely discontinue tomorrow morning if no significant arrhythmias noted.  I will discontinue Cardizem and instead treat with IV metoprolol.  We will continue to follow on telemetry.  Note TSH normal. 2. Hypertension-blood pressure has been borderline.  Will hold hydralazine.  I am discontinuing Cardizem and treating with metoprolol.  Follow blood pressure and adjust regimen as needed. 3. Acute kidney injury-renal function has improved with hydration. 4. Hypernatremia-likely component of dehydration.  Continue to follow. 5. Status post repair of femoral neck fracture-Per orthopedics.  For questions or updates, please contact [facility] Please consult [email or web address] for contact info under    Signed, Olga Millers, MD  08/08/2020 9:58 AM

## 2020-08-08 NOTE — Progress Notes (Signed)
Pt this AM having difficulty swallowing PO pills; holding them in his mouth. MD notified that pt unable to swallow pills, including cardizem. Orders received to begin Cardizem gtt at 5 mg/hr. Gtt ran for 1 hour, before new orders were given by cardiologist to stop gtt, and treat HR with IV metoprolol. Swallow evaluation also ordered by MD. This RN will continue to monitor pt.

## 2020-08-08 NOTE — NC FL2 (Signed)
[place] MEDICAID FL2 LEVEL OF CARE SCREENING TOOL     IDENTIFICATION  Patient Name: Marcus Downs. Birthdate: 1941-06-01 Sex: male Admission Date (Current Location): 08/06/2020  [facility] and [place] Number:  Producer, television/film/video and Address:  [facility],  [address] [place] [address]      Provider Number: [id number]  Attending Physician Name and Address:  [name], MD  Relative Name and Phone Number:       Current Level of Care: Hospital Recommended Level of Care: Skilled Nursing Facility Prior Approval Number:    Date Approved/Denied:   PASRR Number: [id number]  Discharge Plan: SNF    Current Diagnoses: Patient Active Problem List   Diagnosis Date Noted  . Status post hip hemiarthroplasty 08/07/2020  . S/P hip hemiarthroplasty 08/07/2020  . Pressure injury of skin 08/07/2020  . Closed left hip fracture (HCC) 08/06/2020  . Elevated alkaline phosphatase level 11/01/2019  . Type 2 diabetes mellitus with vascular disease (HCC) 08/16/2019  . Diabetes mellitus with cataract (HCC) 08/05/2019  . Weight loss 04/30/2019  . Iron deficiency anemia 04/29/2018  . Pedal edema 07/11/2017  . Primary open angle glaucoma   . Advanced care planning/counseling discussion 02/20/2015  . Benign prostatic hyperplasia 02/20/2015  . Falls 11/08/2013  . Medicare annual wellness visit, subsequent 05/25/2012  . 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 Pain due to onychomycosis of toenails of both feet 09/10/2010  . Renal insufficiency 04/13/2009  . GERD 03/30/2009  . Controlled diabetes mellitus type 2 with complications (HCC) 05/04/2007  . Hyperlipidemia associated with type 2 diabetes mellitus (HCC) 05/04/2007  . Essential hypertension 05/04/2007  . SYMPTOM, INCONTINENCE, MIXED, URGE/STRESS 05/04/2007  . Ex-smoker 05/04/2007    Orientation RESPIRATION BLADDER Height & Weight     Self, Time, Situation, Place  Normal  Incontinent Weight: 78 kg Height:  5' 11.5" (181.6 cm)  BEHAVIORAL SYMPTOMS/MOOD NEUROLOGICAL BOWEL NUTRITION STATUS      Continent Diet  AMBULATORY STATUS COMMUNICATION OF NEEDS Skin   Extensive Assist Verbally PU Stage and Appropriate Care   PU Stage 2 Dressing:  (foam dressing in place, change q5 days)                   Personal Care Assistance Level of Assistance  Bathing, Dressing, Total care Bathing Assistance: Maximum assistance   Dressing Assistance: Maximum assistance Total Care Assistance: Maximum assistance   Functional Limitations Info             SPECIAL CARE FACTORS FREQUENCY  PT (By licensed PT), OT (By licensed OT)     PT Frequency: 5x weekly OT Frequency: 5x weekly            Contractures Contractures Info: Not present    Additional Factors Info  Code Status, Allergies, Insulin Sliding Scale Code Status Info: full Allergies Info: Tape   Insulin Sliding Scale Info: Novolog sliding scale       Current Medications (08/08/2020):  This is the current hospital active medication list Current Facility-Administered Medications  Medication Dose Route Frequency Provider Last Rate Last Admin  . acetaminophen (TYLENOL) tablet 325-650 mg  325-650 mg Oral Q6H PRN Shanon Payor, PA-C      . alum & mag hydroxide-simeth (MAALOX/MYLANTA) 200-200-20 MG/5ML suspension 30 mL  30 mL Oral Q4H PRN Shanon Payor, PA-C      . amiodarone (NEXTERONE PREMIX) 360-4.14 MG/200ML-% (1.8 mg/mL) IV infusion  30 mg/hr Intravenous Continuous Marikay Alar, FNP  16.67 mL/hr at 08/08/20 0640 30 mg/hr at 08/08/20 0640  . aspirin EC tablet 325 mg  325 mg Oral BID PC Shanon Payor, PA-C   325 mg at 08/08/20 0815  . atorvastatin (LIPITOR) tablet 40 mg  40 mg Oral Daily Mansy, Jan A, MD   40 mg at 08/07/20 1035  . Chlorhexidine Gluconate Cloth 2 % PADS 6 each  6 each Topical Daily [name], MD   6 each at 08/07/20 2059  . dextrose 5 % solution   Intravenous Continuous  Alessandra Bevels, MD 50 mL/hr at 08/08/20 1100 Rate Verify at 08/08/20 1100  . docusate sodium (COLACE) capsule 100 mg  100 mg Oral BID Shanon Payor, PA-C      . ferrous sulfate tablet 325 mg  325 mg Oral BID WC Shanon Payor, PA-C   325 mg at 08/08/20 0815  . HYDROmorphone (DILAUDID) injection 0.5-1 mg  0.5-1 mg Intravenous Q4H PRN Shanon Payor, PA-C      . insulin aspart (novoLOG) injection 0-20 Units  0-20 Units Subcutaneous TID PC & HS Mansy, Vernetta Honey, MD   4 Units at 08/08/20 [phone]  . insulin aspart protamine- aspart (NOVOLOG MIX 70/30) injection 25 Units  25 Units Subcutaneous BID WC Mansy, Vernetta Honey, MD   25 Units at 08/07/20 1040  . latanoprost (XALATAN) 0.005 % ophthalmic solution 1 drop  1 drop Both Eyes QHS Mansy, Jan A, MD   1 drop at 08/07/20 2101  . magnesium hydroxide (MILK OF MAGNESIA) suspension 30 mL  30 mL Oral Daily PRN Mansy, Jan A, MD      . MEDLINE mouth rinse  15 mL Mouth Rinse BID [name], MD   15 mL at 08/08/20 0921  . methocarbamol (ROBAXIN) tablet 500 mg  500 mg Oral Q6H PRN Shanon Payor, PA-C       Or  . methocarbamol (ROBAXIN) 500 mg in dextrose 5 % 50 mL IVPB  500 mg Intravenous Q6H PRN Shanon Payor, PA-C      . metoCLOPramide (REGLAN) tablet 5-10 mg  5-10 mg Oral Q8H PRN Shanon Payor, PA-C       Or  . metoCLOPramide (REGLAN) injection 5-10 mg  5-10 mg Intravenous Q8H PRN Shanon Payor, PA-C      . metoprolol tartrate (LOPRESSOR) injection 2.5 mg  2.5 mg Intravenous Q6H Lewayne Bunting, MD      . morphine 2 MG/ML injection 2 mg  2 mg Intravenous Q2H PRN Mansy, Jan A, MD   2 mg at 08/08/20 0134  . ondansetron (ZOFRAN) tablet 4 mg  4 mg Oral Q6H PRN Shanon Payor, PA-C       Or  . ondansetron [facility]) injection 4 mg  4 mg Intravenous Q6H PRN Shanon Payor, PA-C      . oxyCODONE (Oxy IR/ROXICODONE) immediate release tablet 10-15 mg  10-15 mg Oral Q4H PRN Shanon Payor, PA-C      . oxyCODONE (Oxy IR/ROXICODONE) immediate release  tablet 5-10 mg  5-10 mg Oral Q4H PRN Shanon Payor, PA-C   10 mg at 08/08/20 0819  . traZODone (DESYREL) tablet 25 mg  25 mg Oral QHS PRN Mansy, Vernetta Honey, MD         Discharge Medications: Please see discharge summary for a list of discharge medications.  Relevant Imaging Results:  Relevant Lab Results:   Additional Information SSN [id number]  Armanda Heritage, RN

## 2020-08-08 NOTE — Evaluation (Signed)
Clinical/Bedside Swallow Evaluation Patient Details  Name: Marcus Downs. MRN: [id number] Date of Birth: [birth year]  Today's Date: 08/08/2020 Time: SLP Start Time (ACUTE ONLY): 1245 SLP Stop Time (ACUTE ONLY): 1310 SLP Time Calculation (min) (ACUTE ONLY): 25 min  Past Medical History:  Past Medical History:  Diagnosis Date  . CRI (chronic renal insufficiency)   . 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 Diabetes mellitus type II 1992  . HLD (hyperlipidemia) 10/1998  . HTN (hypertension) 10/1998  . Primary open angle glaucoma    [name]  . RBBB   . Stroke [facility])    Left sided weakness   Past Surgical History:  Past Surgical History:  Procedure Laterality Date  . CATARACT EXTRACTION W/PHACO  09/09/2012   Procedure: CATARACT EXTRACTION PHACO AND INTRAOCULAR LENS PLACEMENT (IOC);  Surgeon: [name], MD;  Location[facility] OR;  Service: Ophthalmology;  Laterality: Right;  . CATARACT EXTRACTION W/PHACO Left 03/31/2013   Procedure: CATARACT EXTRACTION PHACO AND INTRAOCULAR LENS PLACEMENT (IOC);  Surgeon: [name], MD;  Location: [facility] OR;  Service: Ophthalmology;  Laterality: Left;  . EYE SURGERY    . HIP ARTHROPLASTY Left 08/07/2020   Procedure: ARTHROPLASTY BIPOLAR HIP (HEMIARTHROPLASTY);  Surgeon: [name], MD;  Location: [facility] ORS;  Service: Orthopedics;  Laterality: Left;  . lipoma removal  1970's   Right shoulder  . US ECHOCARDIOGRAPHY  03/2013   mod LVH, EF 60%, normal wall motion   HPI:  79 yo male adm to [facility] after fall having broken his hip.   His is s/p left hip replacement.  PMH + for CVA 1997, HLD, HTN, right parietal CVA, prior smoker, C5-C6, C6-C7 cervical osteophytes - C5-C7 spondylosis.  He had issues with tachycardia over night and CCS consulted.  Swallow evaluation ordered.   Assessment / Plan / Recommendation Clinical Impression  Pt sitting fully upright and appears comfortable.  Oral motor exam reveals decreased sensation left facial  area.  Palatal elevation bilateral.  Pt appears with significant whitish coating on tongue, ? Due to oral candidiasis.   No indication of aspiration with po observed however pt does have dysphagia with delayed oral transiting, retention of foods in oral cavity- left posterior. He is observed to extend head with swallowing, presumed due to attemptsing to orally transit more efficiently.   Advised against this posture due to aspiration risk.  With appleseauce, he reports senastion of lodging posterior oral cavity with direct question cue but did not appear to be concerned re: its clearance.  Prolonged mastication with solids with oral retention again noted, use of puree facilitated solid clearance and liquid swallows help clear puree retention.   SLP had pt place his lower dentures after SLP cleaned them but no uppers present in hospital room. Pt reports he always uses dentures with po and that the upper and lower were removed at this same time.  Suspect this inability to use dentures is exacerbating his baseline dysphagia present from prior CVA.  Advised pt would inform RN of concerns about upper denture location.  Given pt's oral deficits and use of dentures with all intake *which he can not use currently*, recommend to modify diet to creamy puree/thin.    Would advise pills with ice cream to allow improved sensory input.  Oral suction after meals to assure adequately cleared.  Will follow up for tolerance, readiness for dietary advancement. After evaluation, RN informed SLP that pt's dentures were in two separate containers, SLP did not  locate adhesive on floor and requested pt ask family to bring in for him.  Will follow up next date to determine readiness for dietary advancement. SLP Visit Diagnosis: Dysphagia, oral phase (R13.11);Other (comment) (? pharyngeal involvement due to cervical spondylosis)    Aspiration Risk  Mild aspiration risk    Diet Recommendation Dysphagia 1 (Puree);Thin liquid   Liquid  Administration via: Cup;Straw Medication Administration: Whole meds with puree (with ice cream) Supervision: Patient able to self feed Compensations: Slow rate;Small sips/bites;Other (Comment);Lingual sweep for clearance of pocketing (follow solids with puree, follow puree with liquids, oral suction after po) Postural Changes: Remain upright for at least 30 minutes after po intake;Seated upright at 90 degrees    Other  Recommendations Oral Care Recommendations:  (oral suction after po)   Follow up Recommendations    TBD    Frequency and Duration min 2x/week  2 weeks       Prognosis Prognosis for Safe Diet Advancement: Good      Swallow Study   General Date of Onset: 08/08/20 HPI: 79 yo male adm to [facility] after fall having broken his hip.   His is s/p left hip replacement.  PMH + for CVA 1997, HLD, HTN, right parietal CVA, prior smoker, C5-C6, C6-C7 cervical osteophytes - C5-C7 spondylosis.  He had issues with tachycardia over night and CCS consulted.  Swallow evaluation ordered. Type of Study: Bedside Swallow Evaluation Diet Prior to this Study: Regular;Thin liquids Temperature Spikes Noted: No Respiratory Status: Room air History of Recent Intubation: No Behavior/Cognition: Alert;Cooperative Oral Cavity Assessment: Other (comment);Excessive secretions (whitish coating on tongue) Oral Care Completed by SLP: Yes Oral Cavity - Dentition: Edentulous (no upper denture present) Vision: Functional for self-feeding Self-Feeding Abilities: Able to feed self Patient Positioning: Upright in bed Baseline Vocal Quality: Normal Volitional Cough: Strong Volitional Swallow: Unable to elicit    Oral/Motor/Sensory Function Overall Oral Motor/Sensory Function: Mild impairment Facial ROM: Reduced left Facial Symmetry: Abnormal symmetry left Facial Sensation: Reduced left Lingual ROM: Reduced left Lingual Symmetry: Within Functional Limits Lingual Strength: Reduced Lingual Sensation:  Reduced Velum: Within Functional Limits   Ice Chips Ice chips: Not tested   Thin Liquid Thin Liquid: Within functional limits Presentation: Self Fed;Straw    Nectar Thick Nectar Thick Liquid: Not tested   Honey Thick Honey Thick Liquid: Not tested   Puree Puree: Impaired Presentation: Spoon Oral Phase Functional Implications: Left lateral sulci pocketing;Oral residue;Prolonged oral transit   Solid     Solid: Impaired Oral Phase Impairments: Impaired mastication Oral Phase Functional Implications: Prolonged oral transit;Oral residue;Left lateral sulci pocketing      Chales Abrahams 08/08/2020,1:48 PM  Rolena Infante, MS [facility] SLP Acute Rehab Services Office [phone]

## 2020-08-08 NOTE — Evaluation (Signed)
Physical Therapy Evaluation Patient Details Name: Marcus Downs. MRN: [id number] DOB: 28-Jul-1941 Today's Date: 08/08/2020   History of Present Illness  79 year old with diabetes, hypertension, hyperlipidemia, CVA presenting with fall, L humerus and hip fracture. s/p L hip hemiarthroplasty 08/07/20. Noted to be significantly hypertensive with tachycardia and AKI secondary to dehydration and DKA  Clinical Impression  Pt admitted with above diagnosis. Pt's resting HR 120-130, so no mobility was deferred. Performed bed level exercises. No active movement noted LLE, unclear if this is due to pain or possibly due to prior stroke, pt is a vague historian. Will plan to initiate mobility once HR better controlled.  Pt currently with functional limitations due to the deficits listed below (see PT Problem List). Pt will benefit from skilled PT to increase their independence and safety with mobility to allow discharge to the venue listed below.       Follow Up Recommendations SNF    Equipment Recommendations  None recommended by PT    Recommendations for Other Services       Precautions / Restrictions Precautions Precautions: Posterior Hip Precaution Booklet Issued: Yes (comment) Restrictions Weight Bearing Restrictions: Yes LLE Weight Bearing: Partial weight bearing LLE Partial Weight Bearing Percentage or Pounds: not specified in orders      Mobility  Bed Mobility               General bed mobility comments: did not attempt per RN request 2* HR 120-130 at rest  Transfers                    Ambulation/Gait                Stairs            Wheelchair Mobility    Modified Rankin (Stroke Patients Only)       Balance                                             Pertinent Vitals/Pain Pain Assessment: Faces Faces Pain Scale: Hurts even more Pain Location: L hip with movement Pain Descriptors / Indicators: Grimacing;Guarding Pain  Intervention(s): Limited activity within patient's tolerance    Home Living Family/patient expects to be discharged to:: Skilled nursing facility                      Prior Function           Comments: Pt reports he walked with a cane or a walker PTA. Will need to confirm this.     Hand Dominance        Extremity/Trunk Assessment   Upper Extremity Assessment Upper Extremity Assessment: Defer to OT evaluation    Lower Extremity Assessment Lower Extremity Assessment: LLE deficits/detail LLE Deficits / Details: no active movement of toes or ankle (pt vague historian, unable to state if this is baseline), no quad contraction with attempted quad sets, pt tolerated only 20* L knee flexion PROM, and 5 * L hip abduction PROM 2* pain LLE: Unable to fully assess due to pain LLE Sensation: WNL LLE Coordination: decreased gross motor;decreased fine motor       Communication   Communication: Expressive difficulties;HOH (speech difficult to understand at times, noted h/o CVA)  Cognition Arousal/Alertness: Awake/alert Behavior During Therapy: WFL for tasks assessed/performed Overall Cognitive Status: No family/caregiver present to determine baseline  cognitive functioning                                 General Comments: oriented to self, location, situation, some difficulty following commands, continued to hold his phone to his ear even though there wasn't anyone on the line      General Comments      Exercises General Exercises - Lower Extremity Ankle Circles/Pumps: PROM;Left;10 reps Quad Sets: Right;5 reps;AROM (attempted on L, but no palpable quad contraction noted. Able to perform on R.) Heel Slides: PROM;Left;10 reps;Supine Hip ABduction/ADduction: PROM;Left;5 reps;Supine   Assessment/Plan    PT Assessment Patient needs continued PT services  PT Problem List Decreased strength;Decreased mobility;Decreased range of motion;Decreased activity  tolerance;Pain       PT Treatment Interventions Functional mobility training;Therapeutic exercise;Therapeutic activities;Patient/family education;Balance training    PT Goals (Current goals can be found in the Care Plan section)  Acute Rehab PT Goals Patient Stated Goal: decrease pain PT Goal Formulation: With patient Time For Goal Achievement: 08/22/20 Potential to Achieve Goals: Fair    Frequency Min 3X/week   Barriers to discharge        Co-evaluation               AM-PAC PT "6 Clicks" Mobility  Outcome Measure Help needed turning from your back to your side while in a flat bed without using bedrails?: Total Help needed moving from lying on your back to sitting on the side of a flat bed without using bedrails?: Total Help needed moving to and from a bed to a chair (including a wheelchair)?: Total Help needed standing up from a chair using your arms (e.g., wheelchair or bedside chair)?: Total Help needed to walk in hospital room?: Total Help needed climbing 3-5 steps with a railing? : Total 6 Click Score: 6    End of Session Equipment Utilized During Treatment: Gait belt Activity Tolerance: Patient limited by pain;Treatment limited secondary to medical complications (Comment) (elevated HR) Patient left: in bed;with call bell/phone within reach;with bed alarm set Nurse Communication: Mobility status PT Visit Diagnosis: Difficulty in walking, not elsewhere classified (R26.2);Pain;Muscle weakness (generalized) (M62.81);History of falling (Z91.81) Pain - Right/Left: Left Pain - part of body: Hip    Time: 4098-1191 PT Time Calculation (min) (ACUTE ONLY): 14 min   Charges:   PT Evaluation $PT Eval Low Complexity: 1 Low          Ralene Bathe Kistler PT 08/08/2020  Acute Rehabilitation Services Pager [phone] Office [phone]

## 2020-08-08 NOTE — Progress Notes (Signed)
   NAME:  Marcus Downs., MRN:  [id number], DOB:  13-Mar-1941, LOS: 2 ADMISSION DATE:  08/06/2020, CONSULTATION DATE: 08/07/2020 REFERRING MD: Roger Shelter MD, CHIEF COMPLAINT: Postop shock, tachycardia  Brief History   79 year old with diabetes, hypertension, hyperlipidemia, CVA presenting with fall, humerus and hip fracture Noted to be significantly hypertensive with tachycardia and AKI secondary to dehydration and DKA.  Taking for hip hemiarthroplasty.  Developed shock during procedure.  Estimated blood loss minimal per report PCCM consulted  Past Medical History    has a past medical history of CRI (chronic renal insufficiency), CVA (cerebral infarction) (1997), Diabetes mellitus type II (1992), HLD (hyperlipidemia) (10/1998), HTN (hypertension) (10/1998), Primary open angle glaucoma, RBBB, and Stroke (HCC).  Significant Hospital Events   9/19-admit 9/20-hip hemiarthroplasty  Consults:  Ortho, PCCM  Procedures:    Significant Diagnostic Tests:  CT head, cervical spine 9/20-generalized atrophy, chronic right parietal infarct.  No acute process.  Chest x-ray 9/19-no acute abnormality. I reviewed the images.  EKG 9/20-sinus tachycardia, chronic right bundle branch block  Micro Data:    Antimicrobials:  Cefazolin  Interim history/subjective:   Remains tachycardic through the night.  Started on amiodarone Heart rate better controlled today morning ranging from 90-110.  Remains in sinus rhythm  Objective   Blood pressure 110/76, pulse (!) 35, temperature 100 F (37.8 C), temperature source Oral, resp. rate 12, height 5' 11.5" (1.816 m), weight 78 kg, SpO2 100 %.        Intake/Output Summary (Last 24 hours) at 08/08/2020 0854 Last data filed at 08/08/2020 0840 Gross per 24 hour  Intake 1331.07 ml  Output 1850 ml  Net -518.93 ml   Filed Weights   08/06/20 1636 08/07/20 1415 08/07/20 1907  Weight: 87.5 kg 87.5 kg 78 kg    Examination: Gen:      No acute  distress HEENT:  EOMI, sclera anicteric Neck:     No masses; no thyromegaly Lungs:    Clear to auscultation bilaterally; normal respiratory effort CV:         Regular rate and rhythm; no murmurs Abd:      + bowel sounds; soft, non-tender; no palpable masses, no distension Ext:    No edema; adequate peripheral perfusion Skin:      Warm and dry; no rash Neuro: alert and oriented x 3 Psych: normal mood and affect  Resolved Hospital Problem list     Assessment & Plan:  Hypotension, sinus tachycardia Has required Neo-Synephrine in the OR but is BP better now Continue IV fluids Low lactic acid is reassuring Cefazolin for surgical prophylaxis.  Hold off on additional antibiotics as its not clear if he is an infection Continue outpatient diltiazem, Lopressor as needed and amiodarone Echo pending but unable to be done today morning due to fast heart rate  Dehydration, AKI > improving Continue IV fluids with LR  Diabetes, mild DKA Continue insulin, monitor sugars  Hyperlipidemia Continue statin  History of CVA with hemiparesis Monitor neuro status  Best practice:  Diet: NPO Pain/Anxiety/Delirium protocol (if indicated): NA VAP protocol (if indicated): NA DVT prophylaxis: SCDs GI prophylaxis: NA Glucose control: Insulin, SSI Mobility: Bed Code Status: Full Family Communication: Per primary team Disposition: SDU  Critical care time: NA   Tela Kotecki MD [facility] Please see [email or web address] for pager details.  08/08/2020, 8:54 AM

## 2020-08-08 NOTE — Progress Notes (Signed)
  Speech Language Pathology Treatment: Dysphagia  Patient Details Name: Marcus Downs. MRN: [id number] DOB: Apr 22, 1941 Today's Date: 08/08/2020 Time: 1435-1500 SLP Time Calculation (min) (ACUTE ONLY): 25 min  Assessment / Plan / Recommendation Clinical Impression  Pt did not obtain adequate adhesion to gums with use of fixodent on his dentures despite SlP applying pressure to palate after pt placed them.  He declined to try to place lower dentures and demonstrated decreased awareness to upper denture hanging low and expressed frustration with SLP offering to assist.  Eventually pt removed upper denture and SLP removed Fixodent that was adhered to maxillary gums.   He verbalized "If you would just leave me alone, I'll be fine".  SLP brushed dentures and placed in denture cups.  Pt did consume water with anterior labial loss on left *which he states is not baseline?  No indication of aspiration. HR elevated to 130s for a minute or two during denture placement attempts.  Recommend continues puree/thin diet.  Will follow up for dysphagia management.    HPI HPI: 79 yo male adm to [facility] after fall having broken his hip.   His is s/p left hip replacement.  PMH + for CVA 1997, HLD, HTN, right parietal CVA, prior smoker, C5-C6, C6-C7 cervical osteophytes - C5-C7 spondylosis.  He had issues with tachycardia over night and CCS consulted.  Swallow evaluation ordered.  Pt was seen earlier and given dentures located in room and SLP able to obtain denture adhesive from the SLP office, follow up visit indicated to determine readiness for dietary advancement.  Pt agreeable to plan.      SLP Plan  Continue with current plan of care       Recommendations  Diet recommendations: Dysphagia 1 (puree);Thin liquid Medication Administration: Whole meds with puree (with ice cream) Supervision: Full supervision/cueing for compensatory strategies Compensations: Slow rate;Small sips/bites;Other (Comment);Lingual sweep for  clearance of pocketing (follow solids with puree, follow puree with liquids, oral suction after po) Postural Changes and/or Swallow Maneuvers: Seated upright 90 degrees;Upright 30-60 min after meal                Oral Care Recommendations:  (oral suction after po) SLP Visit Diagnosis: Dysphagia, oral phase (R13.11);Other (comment) (? pharyngeal involvement due to cervical spondylosis) Plan: Continue with current plan of care       GO                Marcus Downs 08/08/2020, 3:13 PM  Marcus Infante, MS [facility] SLP Acute Rehab Services Office [phone]

## 2020-08-08 NOTE — Progress Notes (Signed)
  Echocardiogram 2D Echocardiogram has been performed.  Marcus Downs 08/08/2020, 1:54 PM

## 2020-08-08 NOTE — Progress Notes (Addendum)
BSE completed, pt currently alert and willing to accept some intake.  He denies being hungry however.  Oral motor exam reveals decreased sensation left facial area.  Palatal elevation bilateral.  Pt appears with whitish coating on tongue, ? Due to oral candidiasis.    No indication of aspiration with po observed however pt does have dysphagia with delayed oral transiting, retention of foods in oral cavity- left posterior. He reports sensation to applesauce stasis but did not appear to be concerned re: its clearance.  Following masticates solids with puree facilitated clearance and liquid swallows cleared puree retention.    SLP had pt place his lower dentures after SLP cleaned them but no uppers present in hospital room. Pt reports he always uses dentures with po and that the upper and lower were removed at this same time.  Suspect this inability to use dentures is exacerbating his baseline dysphagia present from prior CVA.  Advised pt would inform RN of concerns about upper denture location.   Given pt's oral deficits and use of dentures with all intake *which he can not use currently*, recommend to modify diet to creamy puree/thin.    Would advise pills with ice cream to allow improved sensory input.  Oral suction after meals to assure adequately cleared.  Will follow up for tolerance, readiness for dietary advancement.  After evaluation, RN informed SLP that pt's dentures were in two separate containers, SLP did not locate adhesive on floor and requested pt ask family to bring in for him.  Will follow up next date to determine readiness for dietary advancement.     Rolena Infante, MS [facility] SLP Acute [employer] [phone]

## 2020-08-08 NOTE — CV Procedure (Signed)
Echo attempted but HR too high. Will try again when HR normalizes. 

## 2020-08-08 NOTE — Progress Notes (Signed)
PROGRESS NOTE    Marcus Downs.  NFA:[id number]  DOB: [birth year]  PCP: Ria Bush, MD Admit date:08/06/2020 Chief compliant:  left hip pain after fell 79 y.o. male who comes from home, with a known history of type 2 diabetes mellitus, hypertension, dyslipidemia and CVA with residual left-sided weakness, who presented to the emergency room, with acute onset of left hip pain after suffering a mechanical  Fall. Also reoprted missing insulin doses for couple of days, now with polyuria /polydipsia. ED Course: Afebrile, BP 144/79 with HR 131.Labs revealed a blood glucose of 469 and a CO2 of 19, BUN of 49 and a creatinine of 1.74  Up from 21/1.31 on 05/01/2020 and LFTs were remarkable for alk phos 172, AST 62 and total protein 8.7 with total bili 1.5.  VBG showed pH 7.33, PCO2 39.6, PO2 44 bicarbonate of 20.5.  Noncontrasted head CT scan revealed generalized cerebral atrophy, chronic predominant right parietal lobe infarct with no acute intracranial abnormality.  C-spine CT showed cervical spine spondylosis most notable from C5-C7 with no acute fracture or malalignment of the spine. EKG showed sinus and possibly ectopic atrial tachycardia with a rate of 128 with multiple PVCs, RBBB. CXR -atelectasis. Left shoulder x-ray showed a subtle fracture through the neck of the left humerus. Left elbow x-ray showed irregularity at the radial head on the lateral view concerning for subtle fracture.Left hip x-ray showed subcapital fracture through the proximal left hip with limited evaluation for dislocation. The patient was given 1.5 L bolus of IV normal saline followed by 100 mL/h Hospital course: Patient admitted to [facility] with Ortho consultation--taken to OR Monday 9/20. IV fluids for dehydration, mild AKI and sinus tachy with heart rate in 130s when seen in rounds preoperatively.  Resumed home medication diltiazem and added hydralazine with which his blood pressure and heart rate were improved prior to going to  the OR.  HCTZ/Cozaar held in view of AKI.  Intraoperatively patient was hypotensive requiring transient pressor support and was also significantly tachycardic with heart rate in 150s in PACU.  Subjective:  Patient transferred to stepdown unit postoperatively last night given concern for tachycardia (heart rate 1 30-1 50s) and borderline low blood pressure (systolic 46N to low 629B).  EKG yesterday showed prolonged QTC (492-520 ms), sinus tach as well as old bundle branch block.  This morning patient in SDU.  Denies any acute pain or palpitations or dizziness.  Oriented x2.  Heart rate on monitor showing 130s.  According to bedside nurse isolated recording of pulse rate 35 overnight likely an error.  Objective: Vitals:   08/08/20 1200 08/08/20 1230 08/08/20 1300 08/08/20 1330  BP: (!) 98/47 111/61 (!) 167/103 126/89  Pulse:      Resp: 20 (!) 23 (!) 21 10  Temp: 98.3 F (36.8 C)     TempSrc: Oral     SpO2:      Weight:      Height:        Intake/Output Summary (Last 24 hours) at 08/08/2020 1413 Last data filed at 08/08/2020 1100 Gross per 24 hour  Intake 1574.09 ml  Output 1850 ml  Net -275.91 ml   Filed Weights   08/06/20 1636 08/07/20 1415 08/07/20 1907  Weight: 87.5 kg 87.5 kg 78 kg    Physical Examination:   General: Moderately built, no acute distress noted Heart: S1-S2 heard, regular rhythm, tachycardic, no murmurs.  No leg edema noted Lungs: Equal air entry bilaterally, no rhonchi or rales on exam, no  accessory muscle use Abdomen: Bowel sounds heard, soft, nontender, nondistended. No organomegaly.  No CVA tenderness Extremities: Angulated left lower extremity, adducted left upper extremity, baseline left hemiparesis. Neurological: Awake alert oriented x3, left hemiplegia  skin: No wounds or rashes.    Data Reviewed: I have personally reviewed following labs and imaging studies  CBC: Recent Labs  Lab 08/06/20 1801 08/06/20 1808 08/07/20 0500 08/08/20 0026  WBC  12.4*  --  11.2* 10.3  NEUTROABS 10.3*  --   --   --   HGB 14.9 15.3 13.2 13.7  HCT 43.6 45.0 38.9* 40.5  MCV 74.8*  --  75.1* 75.6*  PLT 223  --  203 867   Basic Metabolic Panel: Recent Labs  Lab 08/06/20 1801 08/06/20 1801 08/06/20 1808 08/07/20 0500 08/07/20 1058 08/07/20 1918 08/08/20 0026  NA 142   < > 145 154* 150* 153* 151*  K 4.7   < > 4.4 3.9 3.7 4.5 3.9  CL 103   < > 109 114* 112* 109 112*  CO2 19*  --   --  '24 22 25 26  ' GLUCOSE 469*   < > 460* 186* 183* 108* 119*  BUN 49*   < > 45* 36* 31* 28* 24*  CREATININE 1.74*   < > 1.60* 1.46* 1.22 1.26* 1.13  CALCIUM 8.9  --   --  8.9 8.3* 8.9 8.7*  MG  --   --   --   --   --  2.6*  --    < > = values in this interval not displayed.   GFR: Estimated Creatinine Clearance: 58.3 mL/min (by C-G formula based on SCr of 1.13 mg/dL). Liver Function Tests: Recent Labs  Lab 08/06/20 1801  AST 62*  ALT 43  ALKPHOS 172*  BILITOT 1.5*  PROT 8.7*  ALBUMIN 3.5   Recent Labs  Lab 08/06/20 1801  LIPASE 19   No results for input(s): AMMONIA in the last 168 hours. Coagulation Profile: No results for input(s): INR, PROTIME in the last 168 hours. Cardiac Enzymes: No results for input(s): CKTOTAL, CKMB, CKMBINDEX, TROPONINI in the last 168 hours. BNP (last 3 results) No results for input(s): PROBNP in the last 8760 hours. HbA1C: Recent Labs    08/06/20 1801  HGBA1C 7.7*   CBG: Recent Labs  Lab 08/07/20 1429 08/07/20 1725 08/07/20 2149 08/08/20 0723 08/08/20 1156  GLUCAP 94 114* 205* 176* 174*   Lipid Profile: No results for input(s): CHOL, HDL, LDLCALC, TRIG, CHOLHDL, LDLDIRECT in the last 72 hours. Thyroid Function Tests: Recent Labs    08/07/20 1144  TSH 1.369   Anemia Panel: No results for input(s): VITAMINB12, FOLATE, FERRITIN, TIBC, IRON, RETICCTPCT in the last 72 hours. Sepsis Labs: Recent Labs  Lab 08/06/20 1801 08/07/20 1918  LATICACIDVEN 2.5* 1.8    Recent Results (from the past 240 hour(s))   SARS Coronavirus 2 by RT PCR (hospital order, performed in [facility] hospital lab) Nasopharyngeal Nasopharyngeal Swab     Status: None   Collection Time: 08/06/20  6:01 PM   Specimen: Nasopharyngeal Swab  Result Value Ref Range Status   SARS Coronavirus 2 NEGATIVE NEGATIVE Final    Comment: (NOTE) SARS-CoV-2 target nucleic acids are NOT DETECTED.  The SARS-CoV-2 RNA is generally detectable in upper and lower respiratory specimens during the acute phase of infection. The lowest concentration of SARS-CoV-2 viral copies this assay can detect is 250 copies / mL. A negative result does not preclude SARS-CoV-2 infection and should not  be used as the sole basis for treatment or other patient management decisions.  A negative result may occur with improper specimen collection / handling, submission of specimen other than nasopharyngeal swab, presence of viral mutation(s) within the areas targeted by this assay, and inadequate number of viral copies (<250 copies / mL). A negative result must be combined with clinical observations, patient history, and epidemiological information.  Fact Sheet for Patients:   StrictlyIdeas.no  Fact Sheet for Healthcare Providers: BankingDealers.co.za  This test is not yet approved or  cleared by the [place] FDA and has been authorized for detection and/or diagnosis of SARS-CoV-2 by FDA under an Emergency Use Authorization (EUA).  This EUA will remain in effect (meaning this test can be used) for the duration of the COVID-19 declaration under Section 564(b)(1) of the Act, 21 U.S.C. section 360bbb-3(b)(1), unless the authorization is terminated or revoked sooner.  Performed at [facility], [place] [address] [place], [place] [address]   MRSA PCR Screening     Status: None   Collection Time: 08/07/20  6:59 PM   Specimen: Nasopharyngeal  Result Value Ref Range Status   MRSA by PCR  NEGATIVE NEGATIVE Final    Comment:        The GeneXpert MRSA Assay (FDA approved for NASAL specimens only), is one component of a comprehensive MRSA colonization surveillance program. It is not intended to diagnose MRSA infection nor to guide or monitor treatment for MRSA infections. Performed at [facility], [place] [address], [place], [place] [address]   Culture, blood (routine x 2)     Status: None (Preliminary result)   Collection Time: 08/07/20  7:18 PM   Specimen: BLOOD RIGHT HAND  Result Value Ref Range Status   Specimen Description   Final    BLOOD RIGHT HAND Performed at [place] [address], [place], [place] [address]    Special Requests   Final    BOTTLES DRAWN AEROBIC AND ANAEROBIC Blood Culture adequate volume Performed at [place] [address] [place], [place] [address]    Culture   Final    NO GROWTH < 24 HOURS Performed at [place] [address] [place], [place] [address]    Report Status PENDING  Incomplete  Culture, blood (routine x 2)     Status: None (Preliminary result)   Collection Time: 08/07/20  7:18 PM   Specimen: BLOOD LEFT HAND  Result Value Ref Range Status   Specimen Description   Final    BLOOD LEFT HAND Performed at [place] [address], [place], [place] [address]    Special Requests   Final    BOTTLES DRAWN AEROBIC AND ANAEROBIC Blood Culture results may not be optimal due to an inadequate volume of blood received in culture bottles Performed at [facility] [address], [place], [place] [address]    Culture   Final    NO GROWTH < 24 HOURS Performed at [facility] [address] [place], [place] [address]    Report Status PENDING  Incomplete      Radiology Studies: DG Chest 1 View  Result Date: 08/06/2020 CLINICAL DATA:  Left leg pain related to fall. EXAM: CHEST  1 VIEW COMPARISON:   October 30, 2013 FINDINGS: The heart, hila, and mediastinum are normal. No pneumothorax. Minimal atelectasis in the left base. The lungs are otherwise clear. No other acute abnormalities. IMPRESSION:  No active disease. Electronically Signed   By: Dorise Bullion III M.D   On: 08/06/2020 18:08   DG Pelvis 1-2 Views  Result Date: 08/06/2020 CLINICAL DATA:  Pain after fall EXAM: PELVIS - 1-2 VIEW COMPARISON:  None. FINDINGS: A subcapital fracture seen to the left hip. Evaluation for dislocation is limited as only an AP view was obtained. No definite dislocation noted. No other bony abnormalities are identified. IMPRESSION: Subcapital fracture through the proximal left hip. Evaluation for dislocation is limited without a lateral view but there is no evidence of dislocation on this study. Electronically Signed   By: Dorise Bullion III M.D   On: 08/06/2020 18:09   DG Elbow Complete Left  Result Date: 08/06/2020 CLINICAL DATA:  Pain after fall EXAM: LEFT ELBOW - COMPLETE 3+ VIEW COMPARISON:  None. FINDINGS: Evaluation is limited due to positioning. The proximal ulna and radius are not well visualized on the first 3 images as a overlap the humerus. No joint effusion is seen on the lateral view. Mild irregularity of the radial head on the lateral view suggest the possibility of a subtle fracture. No other abnormalities. IMPRESSION: Evaluation is limited due to positioning as above. Irregularity of the radial head on the lateral view is concerning for a subtle fracture. No joint effusion identified. Electronically Signed   By: Dorise Bullion III M.D   On: 08/06/2020 18:07   CT Head Wo Contrast  Result Date: 08/06/2020 CLINICAL DATA:  Status post fall. EXAM: CT HEAD WITHOUT CONTRAST TECHNIQUE: Contiguous axial images were obtained from the base of the skull through the vertex without intravenous contrast. COMPARISON:  October 30, 2013 FINDINGS: Brain: There is mild to moderate severity cerebral atrophy with  widening of the extra-axial spaces and ventricular dilatation. There are areas of decreased attenuation within the white matter tracts of the supratentorial brain, consistent with microvascular disease changes. A large area of cortical encephalomalacia, with adjacent chronic white matter low attenuation, is seen throughout the right hemisphere. This involves predominantly the right parietal lobe and is unchanged in appearance when compared to the prior study. Vascular: No hyperdense vessel or unexpected calcification. Skull: Normal. Negative for fracture or focal lesion. Sinuses/Orbits: No acute finding. Other: None. IMPRESSION: 1. No acute intracranial abnormality. 2. Generalized cerebral atrophy. 3. Chronic predominant right parietal lobe infarct. Electronically Signed   By: Virgina Norfolk M.D.   On: 08/06/2020 19:00   CT Cervical Spine Wo Contrast  Result Date: 08/06/2020 CLINICAL DATA:  Left leg pain related to fall EXAM: CT CERVICAL SPINE WITHOUT CONTRAST TECHNIQUE: Multidetector CT imaging of the cervical spine was performed without intravenous contrast. Multiplanar CT image reconstructions were also generated. COMPARISON:  None. FINDINGS: Alignment: There is straightening of the normal cervical lordosis. Skull base and vertebrae: Visualized skull base is intact. No atlanto-occipital dissociation. The vertebral body heights are well maintained. No fracture or pathologic osseous lesion seen. Soft tissues and spinal canal: The visualized paraspinal soft tissues are unremarkable. No prevertebral soft tissue swelling is seen. The spinal canal is grossly unremarkable, no large epidural collection or significant canal narrowing. Disc levels: Multilevel cervical spine spondylosis is seen with anterior osteophytes, disc osteophyte complex and uncovertebral osteophytes most notable C5-C6 and C6-C7 with moderate neural foraminal narrowing and mild central canal stenosis. Upper chest: The lung apices are clear.  Thoracic inlet is within normal limits. Other: None IMPRESSION: No acute fracture or malalignment of the spine. 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 Electronically Signed  By: Prudencio Pair M.D.   On: 08/06/2020 19:25   Pelvis Portable  Result Date: 08/07/2020 CLINICAL DATA:  Status post left hip replacement EXAM: PORTABLE PELVIS 1 VIEWS COMPARISON:  08/06/2020 FINDINGS: Left hip prosthesis is now seen. No acute fracture or dislocation is seen. No soft tissue abnormality is noted. IMPRESSION: Status post left hip replacement Electronically Signed   By: Inez Catalina M.D.   On: 08/07/2020 19:42   DG Shoulder Left  Addendum Date: 08/06/2020   ADDENDUM REPORT: 08/06/2020 18:05 ADDENDUM: Upon reviewing the left humeral images, there appears to be a subtle a fracture through the neck of the left humerus only seen on the true AP view. This finding was described on the humeral images. Electronically Signed   By: Dorise Bullion III M.D   On: 08/06/2020 18:05   Result Date: 08/06/2020 CLINICAL DATA:  Pain after fall EXAM: LEFT SHOULDER - 2+ VIEW COMPARISON:  None. FINDINGS: Irregularity along the inferior aspect of the glenoid suggest the possibility of previous trauma. No acute fractures or dislocations are seen on today's study. Limited views of the chest are normal. IMPRESSION: Negative. Electronically Signed: By: Dorise Bullion III M.D On: 08/06/2020 18:01   DG Humerus Left  Result Date: 08/06/2020 CLINICAL DATA:  Pain after fall EXAM: LEFT HUMERUS - 2+ VIEW COMPARISON:  None. FINDINGS: There is a subtle lucency extending through the neck of the left humerus only seen on the final image. In retrospect, this is also seen on 1 of the images of the left shoulder but is very subtle. No other fractures. IMPRESSION: Subtle nondisplaced fracture through the neck of the humerus only seen on 1 image of the humeral films and 1 image of the left shoulder films. The finding is subtle but appears  to be real. Electronically Signed   By: Dorise Bullion III M.D   On: 08/06/2020 18:04   DG Femur Min 2 Views Left  Result Date: 08/06/2020 CLINICAL DATA:  Pain after fall. EXAM: LEFT FEMUR 2 VIEWS COMPARISON:  None. FINDINGS: Only the distal half of the femur was imaged on the lateral view. Only the distal third of the femur was imaged on the AP view. The proximal half of the femur was imaged on the lateral view. Given the lack of visualization of the entire femur in all three views, evaluation is limited. Within these limitations, no fractures are seen. IMPRESSION: Only a portion of the femur was seen in each of the three views which limits evaluation. Within these limitations, no fractures are seen. Electronically Signed   By: Dorise Bullion III M.D   On: 08/06/2020 18:00      Scheduled Meds: . aspirin EC  325 mg Oral BID PC  . atorvastatin  40 mg Oral Daily  . Chlorhexidine Gluconate Cloth  6 each Topical Daily  . docusate sodium  100 mg Oral BID  . ferrous sulfate  325 mg Oral BID WC  . insulin aspart  0-20 Units Subcutaneous TID PC & HS  . insulin aspart protamine- aspart  25 Units Subcutaneous BID WC  . latanoprost  1 drop Both Eyes QHS  . mouth rinse  15 mL Mouth Rinse BID  . metoprolol tartrate  2.5 mg Intravenous Q6H  . nystatin  5 mL Oral QID   Continuous Infusions: . amiodarone 30 mg/hr (08/08/20 0640)  . dextrose 50 mL/hr at 08/08/20 1100  . methocarbamol (ROBAXIN) IV        Assessment/Plan:  1.  Mechanical  fall resulting in left upper and lower extremity fractures: Seen by orthopedics and underwent left hip hemiarthroplasty on 9/20.  Had intraoperative hypotension and tachycardia.  Continue pain medications as needed.  May need left upper extremity sling-defer to orthopedics, requested RN to contact them.  2.  Dehydration with AKI:   Creatinine improved after IV hydration but now hypernatremic.Changed IV fluids to hypotonic fluids and sodium level slowly downtrending.   Holding HCTZ, ARB's.  Held Metformin.  Labs in a.m.  3.  Uncontrolled blood pressure, hypertension: Patient has had fluctuating blood pressure with systolic 63O to 177N.  Not sure if he has a component of autonomic neuropathy in the setting of stroke.  He also appears to have tachybradycardia episodes.  Blood pressure significantly elevated at systolic 165B to 903Y and diastolic greater than 333.  Patient noted to be on Norvasc and diltiazem at home (would avoid dual calcium channel blockers). changed Norvasc to hydralazine yesterday.  Added beta-blockers.  Seen by cardiology today and hydralazine/diltiazem discontinued.  Now on scheduled beta-blockers.  Diuretics, ACE inhibitors, potassium supplements remain on hold due to problem #2.   4.  Sinus tachycardia: TSH within normal limits.  Check echo. Continue AV blockers. Control pain.  Requested cardiology evaluation to rule out sick sinus syndrome versus intermittent A. fib with RVR as reported by nurse overnight and started on IV amiodarone (although EKG suggests sinus rhythm with bundle branch block).  Appreciate their input.  Now on beta-blockers and of calcium channel blockers.  He does have one recording of heart rate in 30s but likely an error.  Continue to monitor closely.  5.  Diabetes mellitus with hyperglycemia, mild DKA: POA.  Resolved with IV fluids.  Bicarb now normalized.  Continue 70/30 insulin as well as sliding scale insulin.  Seen by speech therapy this morning and started on dysphagia 1 diet. Of note, patient also on Jardiance/Metformin at home.  6.  Hyperlipidemia: Continue statins  7.  History of CVA: On aspirin 325 mg and statins at home.  Resume   8.  Chronic microcytic anemia: Noted to be on iron supplements, previously hemoglobin 11-12, now 13-14.  DVT prophylaxis: Aspirin 325 mg twice daily per orthopedics Code Status: Full code Family / Patient Communication: Discussed with patient, will attempt to call sister, Wyatt Mage  or son, Jeneen Rinks later today. Disposition Plan:   Status is: Inpatient  Remains inpatient appropriate because:Ongoing active pain requiring inpatient pain management, Ongoing diagnostic testing needed not appropriate for outpatient work up, Unsafe d/c plan and Inpatient level of care appropriate due to severity of illness   Dispo: The patient is from: Home              Anticipated d/c is to: May need rehab              Anticipated d/c date is: 3 days              Patient currently is not medically stable to d/c.      Time spent: 35 minutes     >50% time spent in discussions with care team and coordination of care.    Guilford Shi, MD [facility] Pager in [place]  If 7PM-7AM, please contact night-coverage [email or web address] 08/08/2020, 2:13 PM

## 2020-08-08 NOTE — Progress Notes (Signed)
Patient HR has remained between 120s-150s since 1900 shift began. PRN metoprolol given. Cardiziem bolus given and Amiodarone drip started. All are non-effective. Patient's 0500 EKG showed significant findings (please assess). This RN paged on call [facility] throughout shift and made them aware. Patient states he has no chest pain or pain at surgical site. All other vital signs are stable. RN will continue to monitor.

## 2020-08-08 NOTE — Progress Notes (Signed)
Subjective: 1 Day Post-Op Procedure(s) (LRB): ARTHROPLASTY BIPOLAR HIP (HEMIARTHROPLASTY) (Left) Patient reports pain as mild.   Patient has had significant issues with his tachycardia and blood pressure over the night.  He has been evaluated by critical care. Objective: Vital signs in last 24 hours: Temp:  [98 F (36.7 C)-100.6 F (38.1 C)] 100.6 F (38.1 C) (09/21 0400) Pulse Rate:  [35-151] 35 (09/21 0100) Resp:  [0-27] 25 (09/21 0700) BP: (96-168)/(54-113) 144/74 (09/21 0700) SpO2:  [89 %-100 %] 97 % (09/21 0100) Weight:  [78 kg-87.5 kg] 78 kg (09/20 1907)  Intake/Output from previous day: 09/20 0701 - 09/21 0700 In: 1211.1 [I.V.:961.1; IV Piggyback:250] Out: 1850 [Urine:1750; Blood:100] Intake/Output this shift: No intake/output data recorded.  Recent Labs    08/06/20 1801 08/06/20 1808 08/07/20 0500 08/08/20 0026  HGB 14.9 15.3 13.2 13.7   Recent Labs    08/07/20 0500 08/08/20 0026  WBC 11.2* 10.3  RBC 5.18 5.36  HCT 38.9* 40.5  PLT 203 190   Recent Labs    08/07/20 1918 08/08/20 0026  NA 153* 151*  K 4.5 3.9  CL 109 112*  CO2 25 26  BUN 28* 24*  CREATININE 1.26* 1.13  GLUCOSE 108* 119*  CALCIUM 8.9 8.7*   No results for input(s): LABPT, INR in the last 72 hours.  Patient not able to really move the left leg at all.  He is really having minimal pain with passive range of motion.   Assessment/Plan: 1 Day Post-Op Procedure(s) (LRB): ARTHROPLASTY BIPOLAR HIP (HEMIARTHROPLASTY) (Left) with some issues related to pulse and blood pressure.  We greatly appreciate the help of the internal medicine service and pulmonary critical care services. Advance diet Up with therapy Discharge to SNF once the medical services are confident that his medical situation is stabilized.  Patient is currently in a knee immobilizer with no metal stays to protect his skin.  The patient is safe for mobilization of any and all kinds.  If the knee immobilizer becomes a  hindrance to his mobilization it can be removed.  The knee immobilizer just there to keep the patient from flexing the knee and adductor in the leg.  Given that he has the hemiplegia I'm not certain that he will be flexing the knee that much.  Hopefully he can begin to mobilize and attempt to get back to his preinjury level of function.    Harvie Junior 08/08/2020, 7:57 AM

## 2020-08-09 ENCOUNTER — Other Ambulatory Visit: Payer: Self-pay

## 2020-08-09 ENCOUNTER — Inpatient Hospital Stay (HOSPITAL_COMMUNITY): Payer: Medicare Other

## 2020-08-09 DIAGNOSIS — R000 Tachycardia, unspecified: Secondary | ICD-10-CM

## 2020-08-09 LAB — CBC
HCT: 36.4 % — ABNORMAL LOW (ref 39.0–52.0)
Hemoglobin: 12.4 g/dL — ABNORMAL LOW (ref 13.0–17.0)
MCH: 25.4 pg — ABNORMAL LOW (ref 26.0–34.0)
MCHC: 34.1 g/dL (ref 30.0–36.0)
MCV: 74.4 fL — ABNORMAL LOW (ref 80.0–100.0)
Platelets: 133 10*3/uL — ABNORMAL LOW (ref 150–400)
RBC: 4.89 MIL/uL (ref 4.22–5.81)
RDW: 19.8 % — ABNORMAL HIGH (ref 11.5–15.5)
WBC: 10.6 10*3/uL — ABNORMAL HIGH (ref 4.0–10.5)
nRBC: 0 % (ref 0.0–0.2)

## 2020-08-09 LAB — GLUCOSE, CAPILLARY
Glucose-Capillary: 267 mg/dL — ABNORMAL HIGH (ref 70–99)
Glucose-Capillary: 235 mg/dL — ABNORMAL HIGH (ref 70–99)
Glucose-Capillary: 194 mg/dL — ABNORMAL HIGH (ref 70–99)
Glucose-Capillary: 229 mg/dL — ABNORMAL HIGH (ref 70–99)
Glucose-Capillary: 97 mg/dL (ref 70–99)

## 2020-08-09 LAB — MAGNESIUM: Magnesium: 2.2 mg/dL (ref 1.7–2.4)

## 2020-08-09 LAB — BASIC METABOLIC PANEL
Anion gap: 11 (ref 5–15)
BUN: 18 mg/dL (ref 8–23)
CO2: 27 mmol/L (ref 22–32)
Calcium: 7.8 mg/dL — ABNORMAL LOW (ref 8.9–10.3)
Chloride: 106 mmol/L (ref 98–111)
Creatinine, Ser: 1.21 mg/dL (ref 0.61–1.24)
GFR calc Af Amer: >60 mL/min (ref >60)
GFR calc non Af Amer: 57 mL/min — ABNORMAL LOW (ref >60)
Glucose, Bld: 174 mg/dL — ABNORMAL HIGH (ref 70–99)
Potassium: 3.3 mmol/L — ABNORMAL LOW (ref 3.5–5.1)
Sodium: 144 mmol/L (ref 135–145)

## 2020-08-09 MED ORDER — POLYETHYLENE GLYCOL 3350 17 G PO PACK
17.0000 g | PACK | Freq: Every day | ORAL | Status: DC
  Administered 2020-08-09 – 2020-08-14 (×5): 17 g via ORAL
  Filled 2020-08-09 (×5): qty 1

## 2020-08-09 MED ORDER — METOPROLOL TARTRATE 25 MG PO TABS
25.0000 mg | ORAL_TABLET | Freq: Two times a day (BID) | ORAL | Status: DC
  Administered 2020-08-09 – 2020-08-14 (×11): 25 mg via ORAL
  Filled 2020-08-09 (×11): qty 1

## 2020-08-09 MED ORDER — POTASSIUM CHLORIDE CRYS ER 20 MEQ PO TBCR
40.0000 meq | EXTENDED_RELEASE_TABLET | Freq: Once | ORAL | Status: AC
  Administered 2020-08-09: 40 meq via ORAL
  Filled 2020-08-09: qty 2

## 2020-08-09 MED ORDER — SODIUM CHLORIDE 0.9 % IV SOLN: INTRAVENOUS | Status: DC

## 2020-08-09 NOTE — Progress Notes (Signed)
eLink Physician-Brief Progress Note Patient Name: Marcus Downs. DOB: [birth year] MRN: [id number]   Date of Service  08/09/2020  HPI/Events of Note  K 3.3, creatinine 1.21  eICU Interventions  Ordered Potassium 40 meqs PO Add on magnesium level        [name] 08/09/2020, 5:32 AM

## 2020-08-09 NOTE — Progress Notes (Signed)
Occupational Therapy Evaluation  RN cleared OT to attempt mobility this session however due to pt cognition and resting HR bed level eval at this time.  Patient with functional deficits listed below impacting safety and independence with self care. Unsure of patient's baseline level of functioning, currently with decreased alertness requiring min to mod cues throughout session to keep eyes open. Patient oriented to self, month but unable to state where he is or the year. Patient with decreased task initiation, requiring mod cues and assist to thoroughly wash his face at bed level. Attempt to have patient roll to L however patient appears reluctant not wanting to reach with R hand for bed rail. Pt HR 100-120 throughout session. Recommend continued acute OT services for ADL and balance training, strengthening, activity tolerance in order to facilitate D/C to venue listed below.    08/09/20 1200  OT Visit Information  Last OT Received On 08/09/20  Assistance Needed +2  History of Present Illness 79 year old with diabetes, hypertension, hyperlipidemia, CVA presenting with fall, L humerus and hip fracture. s/p L hip hemiarthroplasty 08/07/20. Noted to be significantly hypertensive with tachycardia and AKI secondary to dehydration and DKA  Precautions  Precautions Posterior Hip  Restrictions  Weight Bearing Restrictions Yes  LLE Weight Bearing PWB  LLE Partial Weight Bearing Percentage or Pounds under pt orders WB status details reads WBAT with assistance  Home Living  Family/patient expects to be discharged to: Skilled nursing facility  Living Arrangements Alone  Prior Function  Level of Independence Independent  Comments patient reports using wheelchair and walker however patient oriented to self and month only, will need to confirm with family PLOF   Communication  Communication HOH  Pain Assessment  Pain Assessment Faces  Faces Pain Scale 6  Pain Location does not specify, grimaces with B  LE movement  Pain Descriptors / Indicators Grimacing;Guarding  Pain Intervention(s) Monitored during session  Cognition  Arousal/Alertness Lethargic  Behavior During Therapy [facility] for tasks assessed/performed  Overall Cognitive Status No family/caregiver present to determine baseline cognitive functioning  General Comments oriented to self and month only, asked multiple times location but patient does not provide answer, limited direction following and task initiation  Upper Extremity Assessment  Upper Extremity Assessment Generalized weakness;LUE deficits/detail  LUE Deficits / Details L UE in sling upon arrival adjusted for positioning, hx CVA with L hemiplegia 0/5 grip  LUE Unable to fully assess due to immobilization  Lower Extremity Assessment  Lower Extremity Assessment Defer to PT evaluation  ADL  Overall ADL's  Needs assistance/impaired  Grooming Moderate assistance;Bed level;Wash/dry face  Grooming Details (indicate cue type and reason) for thoroughness, patient initially wash just R eye and cheek. requires cues for initiating task  Upper Body Bathing Maximal assistance;Bed level  Lower Body Bathing Total assistance  Upper Body Dressing  Maximal assistance;Bed level  Lower Body Dressing Total assistance  Toilet Transfer Details (indicate cue type and reason) unable to assess due to safety with x1 assist available and patient limited participation in bed mobility  Toileting- Clothing Manipulation and Hygiene Total assistance;Bed level  General ADL Comments unsure of patient's baseline however currently requiring extensive assistance for self care due to pain, decreased safety awareness, cognition, limited mobility  Bed Mobility  Overal bed mobility Needs Assistance  Bed Mobility Rolling  Rolling Total assist  General bed mobility comments very minimal initiation from patient to attempt rolling onto L side. Patient able to bend R knee however does not maintain bent position without  assistance. Also follows directions for R shoulder flexion, extension but when prompted to reach for bed rail pt states "You've got all types of tricks" patient appears reluctant to try bed mobility  Transfers  General transfer comment NT  General Comments  General comments (skin integrity, edema, etc.) HR between 100-120 throughout evaluation, O2 stable on 1L  OT - End of Session  Equipment Utilized During Treatment Oxygen;Left knee immobilizer  Activity Tolerance Patient limited by lethargy;Patient limited by pain;Treatment limited secondary to medical complications (Comment) (elevated HR at rest)  Patient left in bed;with call bell/phone within reach;with bed alarm set  OT Assessment  OT Recommendation/Assessment Patient needs continued OT Services  OT Visit Diagnosis Other abnormalities of gait and mobility (R26.89);Muscle weakness (generalized) (M62.81);History of falling (Z91.81);Pain;Other symptoms and signs involving cognitive function  Pain - Right/Left Left  Pain - part of body Hip  OT Problem List Decreased strength;Decreased range of motion;Decreased activity tolerance;Impaired balance (sitting and/or standing);Decreased coordination;Decreased cognition;Decreased safety awareness;Decreased knowledge of use of DME or AE;Decreased knowledge of precautions;Pain  Barriers to Discharge Comments unsure of caregiver assistance at home  OT Plan  OT Frequency (ACUTE ONLY) Min 2X/week  OT Treatment/Interventions (ACUTE ONLY) Self-care/ADL training;Therapeutic exercise;DME and/or AE instruction;Therapeutic activities;Cognitive remediation/compensation;Patient/family education;Balance training  AM-PAC OT "6 Clicks" Daily Activity Outcome Measure (Version 2)  Help from another person eating meals? 2  Help from another person taking care of personal grooming? 2  Help from another person toileting, which includes using toliet, bedpan, or urinal? 1  Help from another person bathing (including  washing, rinsing, drying)? 1  Help from another person to put on and taking off regular upper body clothing? 2  Help from another person to put on and taking off regular lower body clothing? 1  6 Click Score 9  OT Recommendation  Follow Up Recommendations SNF;Supervision/Assistance - 24 hour  OT Equipment Other (comment) (defer to next venue)  Individuals Consulted  Consulted and Agree with Results and Recommendations Patient  Acute Rehab OT Goals  Patient Stated Goal decrease pain  OT Goal Formulation With patient  Time For Goal Achievement 08/23/20  Potential to Achieve Goals Good  OT Time Calculation  OT Start Time (ACUTE ONLY) 1133  OT Stop Time (ACUTE ONLY) 1151  OT Time Calculation (min) 18 min  OT General Charges  $OT Visit 1 Visit  OT Evaluation  $OT Eval Low Complexity 1 Low  Written Expression  Dominant Hand Right   Marlyce Huge OT OT pager: [phone]

## 2020-08-09 NOTE — Progress Notes (Signed)
Physical Therapy Treatment Patient Details Name: Marcus Downs. MRN: [id number] DOB: 04-02-1941 Today's Date: 08/09/2020    History of Present Illness 79 year old with diabetes, hypertension, hyperlipidemia, CVA presenting with fall, L humerus and hip fracture. s/p L hip hemiarthroplasty 08/07/20. Noted to be significantly hypertensive with tachycardia and AKI secondary to dehydration and DKA    PT Comments    Pt reluctantly agreeable to EOB.  Remains oriented only to self and hip injury. Requires +2 assist for supine<>sit.  Still on amio drip however rate fairly well controlled with HR 86--118--127 during session, other VSS.   Follow Up Recommendations  SNF     Equipment Recommendations  None recommended by PT    Recommendations for Other Services       Precautions / Restrictions Precautions Precautions: Posterior Hip Restrictions Weight Bearing Restrictions: Yes LLE Weight Bearing: Partial weight bearing LLE Partial Weight Bearing Percentage or Pounds: not specified    Mobility  Bed Mobility Overal bed mobility: Needs Assistance Bed Mobility: Supine to Sit;Sit to Supine Rolling: Total assist   Supine to sit: +2 for physical assistance;+2 for safety/equipment;Max assist Sit to supine: Total assist;+2 for physical assistance;+2 for safety/equipment   General bed mobility comments: assist with bil LEs and trunk in both directions. pt minimally able to self assist with RUE.   Transfers                 General transfer comment: NT  Ambulation/Gait                 Stairs             Wheelchair Mobility    Modified Rankin (Stroke Patients Only)       Balance   Sitting-balance support: Single extremity supported Sitting balance-Leahy Scale: Poor Sitting balance - Comments: LOB to R side, possibly incr shifting to R d/t L hip pain. pt refuses to answer when asked                                     Cognition  Arousal/Alertness: Awake/alert Behavior During Therapy: Flat affect (borderline agitation at times ) Overall Cognitive Status: No family/caregiver present to determine baseline cognitive functioning                                 General Comments: oriented to self only,  limited direction following and task initiation      Exercises General Exercises - Lower Extremity Heel Slides: 10 reps;Left;AROM;PROM    General Comments General comments (skin integrity, edema, etc.): HR between 100-120 throughout evaluation, O2 stable on 1L      Pertinent Vitals/Pain Pain Assessment: Faces Faces Pain Scale: Hurts even more Pain Location: L hip Pain Descriptors / Indicators: Grimacing;Guarding Pain Intervention(s): Limited activity within patient's tolerance;Monitored during session;Repositioned    Home Living Family/patient expects to be discharged to:: Skilled nursing facility Living Arrangements: Alone                  Prior Function Level of Independence: Independent      Comments: patient reports using wheelchair and walker however patient oriented to self and month only, will need to confirm with family PLOF    PT Goals (current goals can now be found in the care plan section) Acute Rehab PT Goals Patient Stated Goal: decrease pain PT Goal  Formulation: With patient Time For Goal Achievement: 08/22/20 Potential to Achieve Goals: Fair Progress towards PT goals: Progressing toward goals    Frequency    Min 2X/week      PT Plan Current plan remains appropriate    Co-evaluation              AM-PAC PT "6 Clicks" Mobility   Outcome Measure  Help needed turning from your back to your side while in a flat bed without using bedrails?: Total Help needed moving from lying on your back to sitting on the side of a flat bed without using bedrails?: Total Help needed moving to and from a bed to a chair (including a wheelchair)?: Total Help needed standing  up from a chair using your arms (e.g., wheelchair or bedside chair)?: Total Help needed to walk in hospital room?: Total Help needed climbing 3-5 steps with a railing? : Total 6 Click Score: 6    End of Session Equipment Utilized During Treatment: Gait belt Activity Tolerance: Patient limited by fatigue;Other (comment);Patient limited by pain (cognition) Patient left: in bed;with call bell/phone within reach;with bed alarm set Nurse Communication: Mobility status PT Visit Diagnosis: Difficulty in walking, not elsewhere classified (R26.2);Pain;Muscle weakness (generalized) (M62.81);History of falling (Z91.81) Pain - Right/Left: Left Pain - part of body: Hip     Time: 1211-1229 PT Time Calculation (min) (ACUTE ONLY): 18 min  Charges:  $Therapeutic Activity: 8-22 mins                     Delice Bison, PT  Acute Rehab Dept ([facility]) [phone] Pager [phone]  08/09/2020    [facility] 08/09/2020, 1:49 PM

## 2020-08-09 NOTE — Progress Notes (Addendum)
PROGRESS NOTE    Marcus Downs.  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 Unclear source of fever a. Not intimated re fever earlier today-get blood cultures x2 b. Chest x-ray negative for overt pneumonia but patient has prior CVA with severe left hemiparesis and slurred speech c. Keep on dysphagia diet 1 as per speech therapy d. Hold antibiotics at this time 2. Accidental mechanical fall left hip and shoulder fracture a. Mobilize as tolerated with therapy will need skilled placement b. Continue sling to left arm c. P WBAT per orthopedics-TED hose in addition d. Pain control oxycodone 5-10 every 4 as needed moderate and higher dosing for severe pain e. Discontinue Dilaudid f. DVT prophylaxis aspirin 325 twice daily 3. Sinus tachycardia found to be sinus tach with PAT has been started on amiodarone a. Amiodarone discontinued in favor of metoprolol 25 twice daily b. Titrate as tolerated per cardiology c. Still sinus tach but can probably transfer out of the unit today d. Checking a.m. magnesium e. At home is on Cardizem 360 XR 4. Early DKA a. Resolved-blood sugars 1 94-67 continue 70/30 insulin 25 units in addition to sliding scale b. He is on D5  which I discontinued c. Resume on discharge Jardiance 10 daily Metformin 500 XR daily 5. Hypernatremia, hypokalemia as well as AKI a. Improving slowly change fluids to NS 50 cc/h b. A.m. labs c. Hypokalemia replacing orally 6. HTN  7. HLD a. Holding Caduet at this time can resume on discharge b. At this time hold HCTZ 25 and Cozaar 100 8. BPH 9. CVA 1997  a. severe deficits on the left side and will more likely than not need skilled care 10. Prior smoker 9. Iron deficiency anemia a. Iron twice a day   DVT prophylaxis: Aspirin, TED hose Code Status: Full Family Communication: Called Sister Theus Espin no answer on phone Disposition:   Status is: Inpatient  Remains inpatient appropriate because:Hemodynamically unstable, Persistent severe electrolyte disturbances, Unsafe d/c plan and IV treatments appropriate due to intensity of illness or inability to take PO   Dispo:  Patient From: Home  Planned Disposition: To be determined  Expected discharge date: 08/11/20  Medically stable for discharge: No        Consultants:   Orthopedics  Procedures: Hip repair by Dr. [name] 08/07/2020  Antimicrobials: None currently   Subjective: Awake alert coherent slurred speech to some degree About to eat Can orient Feels weaker on the left side which is usual Had a fever earlier today No other complaints  Objective: Vitals:   08/08/20 2300 08/08/20 2328 08/09/20 0000 08/09/20 0332  BP: (!) 159/64  126/70   Pulse:      Resp: (!) 25  (!) 24   Temp:  98.5 F (36.9 C)  98.8 F (37.1 C)  TempSrc:  Oral  Axillary  SpO2: 98%  99%   Weight:      Height:        Intake/Output Summary (Last 24 hours) at 08/09/2020 0659 Last data filed at 08/09/2020 9163 Gross per 24 hour  Intake 1444.09 ml  Output 3050 ml  Net -1605.91 ml   Filed Weights   08/06/20 1636 08/07/20 1415 08/07/20 1907  Weight: 87.5 kg 87.5 kg 78 kg    Examination:  General exam: EOMI NCAT slurred  speech mouth twisting to the left side Respiratory system: Clear no rales no rhonchi Cardiovascular system: S1-S2 no murmur rub or gallop Gastrointestinal system: Soft nontender no hepatosplenomegaly scaphoid abdomen. Central nervous system: Overall weak on left side did not examine finger-nose-finger or reflexes today Extremities: ROM intact to right side Skin: No lower extremity edema Psychiatry: Euthymic and congruent  Data Reviewed: I have personally reviewed following labs and imaging studies Potassium 3.3  BUNs/creatinine down to 18/1.2 White count 10.6 hemoglobin 12.4 platelet 133  Radiology Studies: Pelvis Portable  Result Date: 08/07/2020 CLINICAL DATA:  Status post left hip replacement EXAM: PORTABLE PELVIS 1 VIEWS COMPARISON:  08/06/2020 FINDINGS: Left hip prosthesis is now seen. No acute fracture or dislocation is seen. No soft tissue abnormality is noted. IMPRESSION: Status post left hip replacement Electronically Signed   By: Alcide Clever M.D.   On: 08/07/2020 19:42   ECHOCARDIOGRAM LIMITED  Result Date: 08/08/2020    ECHOCARDIOGRAM LIMITED REPORT   Patient Name:   Marcus Dorko. Date of Exam: 08/08/2020 Medical Rec #:  [id number]         Height:       71.5 in Accession #:    [id number]        Weight:       172.0 lb Date of Birth:  October 09, 1941          BSA:          1.988 m Patient Age:    79 years          BP:           1126/89 mmHg Patient Gender: M                 HR:           130 bpm. Exam Location:  Inpatient Procedure: 2D Echo Indications:    Abnormal EKG  History:        Patient has prior history of Echocardiogram examinations, most                 recent 03/26/2013. Stroke, Arrythmias:Atrial Fibrillation and                 Tachycardia; Risk Factors:Diabetes and Hypertension. S/p Hip                 surgery.  Sonographer:    Lavenia Atlas Referring Phys: [id number] Alessandra Bevels IMPRESSIONS  1. There is incessant arrhythmia with varying QRS morphology (appears to be  atrial fibrillation with frequent PVCs), that makes assessment of regional and global left ventricular function more challenging. Left ventricular ejection fraction, by estimation, is 55 to 60%. The left ventricle has normal function. The left ventricle has no regional wall motion abnormalities. Left ventricular diastolic function could not be evaluated.  2. Right ventricular systolic function is normal. The right ventricular size is normal. Tricuspid regurgitation signal is inadequate for assessing PA pressure.  3. The mitral valve is normal in structure. Mild mitral valve regurgitation.  4. The aortic  valve is grossly normal. There is mild calcification of the aortic valve. Aortic valve regurgitation is not visualized. Mild aortic valve sclerosis is present, with no evidence of aortic valve stenosis. FINDINGS  Left Ventricle: There is incessant arrhythmia with varying QRS morphology (appears to be atrial fibrillation with frequent PVCs), that makes assessment of regional and global left ventricular function more challenging. Left ventricular ejection fraction, by estimation, is 55 to 60%. The left ventricle has normal function. The left ventricle has no regional wall motion abnormalities. There is no left ventricular hypertrophy. Left ventricular diastolic function could not be evaluated. Left ventricular diastolic function could not be evaluated due to atrial fibrillation. Right Ventricle: The right ventricular size is normal. No increase in right ventricular wall thickness. Right ventricular systolic function is normal. Tricuspid regurgitation signal is inadequate for assessing PA pressure. Right Atrium: Right atrial size was normal in size. Pericardium: There is no evidence of pericardial effusion. Mitral Valve: The mitral valve is normal in structure. Mild mitral valve regurgitation. Tricuspid Valve: The tricuspid valve is grossly normal. Tricuspid valve regurgitation is not demonstrated. Aortic Valve: The  aortic valve is grossly normal. There is mild calcification of the aortic valve. Aortic valve regurgitation is not visualized. Mild aortic valve sclerosis is present, with no evidence of aortic valve stenosis. Pulmonic Valve: The pulmonic valve was not well visualized. Pulmonic valve regurgitation is not visualized. Aorta: The aortic root and ascending aorta are structurally normal, with no evidence of dilitation. Venous: The inferior vena cava was not well visualized. IAS/Shunts: The interatrial septum was not assessed. Thurmon Fair MD Electronically signed by Thurmon Fair MD Signature Date/Time: 08/08/2020/3:12:26 PM    Final      Scheduled Meds: . aspirin EC  325 mg Oral BID PC  . atorvastatin  40 mg Oral Daily  . Chlorhexidine Gluconate Cloth  6 each Topical Daily  . docusate sodium  100 mg Oral BID  . ferrous sulfate  325 mg Oral BID WC  . insulin aspart  0-20 Units Subcutaneous TID PC & HS  . insulin aspart protamine- aspart  25 Units Subcutaneous BID WC  . latanoprost  1 drop Both Eyes QHS  . mouth rinse  15 mL Mouth Rinse BID  . metoprolol tartrate  25 mg Oral BID  . nystatin  5 mL Oral QID   Continuous Infusions: . amiodarone 30 mg/hr (08/09/20 0527)  . dextrose 75 mL/hr at 08/09/20 0525  . methocarbamol (ROBAXIN) IV       LOS: 3 days    Time spent: 71  Rhetta Mura, MD [facility] To contact the attending provider between 7A-7P or the covering provider during after hours 7P-7A, please log into the web site [email or web address] and access using universal [place] password for that web site. If you do not have the password, please call the hospital operator.  08/09/2020, 6:59 AM

## 2020-08-09 NOTE — Progress Notes (Signed)
Subjective: Marcus Downs was examined in bed this afternoon in his hospital room.  He is 2 days postop status post left hip hemiarthroplasty.  He has a history of a prior CVA with persistent left-sided hemiparesis.  There has been difficulty since his hospital admission with regards to controlling his tachycardia and hypertension.  He is being followed by cardiology and critical care.  Patient reports pain today as mild.    Objective: Vital signs in last 24 hours: Temp:  [98.5 F (36.9 C)-101.1 F (38.4 C)] 99.7 F (37.6 C) (09/22 1848) Pulse Rate:  [108-117] 117 (09/22 1848) Resp:  [16-31] 18 (09/22 1848) BP: (102-159)/(43-80) 102/65 (09/22 1848) SpO2:  [93 %-99 %] 99 % (09/22 1848)  Intake/Output from previous day: 09/21 0701 - 09/22 0700 In: 1444.1 [P.O.:540; I.V.:904.1] Out: 3050 [Urine:3050] Intake/Output this shift: No intake/output data recorded.  Recent Labs    08/07/20 0500 08/08/20 0026 08/09/20 0246  HGB 13.2 13.7 12.4*   Recent Labs    08/08/20 0026 08/09/20 0246  WBC 10.3 10.6*  RBC 5.36 4.89  HCT 40.5 36.4*  PLT 190 133*   Recent Labs    08/08/20 0026 08/09/20 0246  NA 151* 144  K 3.9 3.3*  CL 112* 106  CO2 26 27  BUN 24* 18  CREATININE 1.13 1.21  GLUCOSE 119* 174*  CALCIUM 8.7* 7.8*   No results for input(s): LABPT, INR in the last 72 hours.  WDWN male.  No acute distress.  Resting in bed comfortably.  Incision: dressing C/D/IHypoesthesia noted to the left lower extremity but this is baseline for him.  Motor was 0/5 on examination but again this is baseline.   Assessment/Plan: 2 Days Post-Op Procedure(s) (LRB): ARTHROPLASTY BIPOLAR HIP (HEMIARTHROPLASTY) (Left) after a fall resulting in a left femoral neck fracture.  From an orthopedic perspective, the patient is doing well postoperatively.  He will likely discharge to a SNF/rehab facility once stable from a critical care/cardiology perspective. Advance diet Up with therapy  Shanon Payor 08/09/2020, 9:56 PM

## 2020-08-09 NOTE — Progress Notes (Signed)
NAME:  Clennon Nasca., MRN:  [id number], DOB:  1940/11/20, LOS: 3 ADMISSION DATE:  08/06/2020, CONSULTATION DATE: 08/07/2020 REFERRING MD: Roger Shelter MD, CHIEF COMPLAINT: Postop shock, tachycardia  Brief History   79 year old with diabetes, hypertension, hyperlipidemia, CVA presenting with fall, humerus and hip fracture Noted to be significantly hypertensive with tachycardia and AKI secondary to dehydration and DKA.  Taking for hip hemiarthroplasty.  Developed shock during procedure.  Estimated blood loss minimal per report PCCM consulted  Past Medical History    has a past medical history of CRI (chronic renal insufficiency), CVA (cerebral infarction) (1997), Diabetes mellitus type II (1992), HLD (hyperlipidemia) (10/1998), HTN (hypertension) (10/1998), Primary open angle glaucoma, RBBB, and Stroke (HCC).  Significant Hospital Events   9/19-admit 9/20-hip hemiarthroplasty  Consults:  Ortho, PCCM  Procedures:    Significant Diagnostic Tests:  CT head, cervical spine 9/20-generalized atrophy, chronic right parietal infarct.  No acute process.  Chest x-ray 9/19-no acute abnormality. I reviewed the images.  EKG 9/20-sinus tachycardia, chronic right bundle branch block  Micro Data:  Blood 9/20 >   Antimicrobials:  Cefazolin  Interim history/subjective:  No complaints at this time. Pain well controlled. No CP, SOB. Not lightheaded. No palpitations.  No acute events overnight.   Objective   Blood pressure (!) 152/59, pulse (!) 106, temperature 98.8 F (37.1 C), temperature source Axillary, resp. rate 16, height 5' 11.5" (1.816 m), weight 78 kg, SpO2 99 %.        Intake/Output Summary (Last 24 hours) at 08/09/2020 0739 Last data filed at 08/09/2020 3009 Gross per 24 hour  Intake 1444.09 ml  Output 3050 ml  Net -1605.91 ml   Filed Weights   08/06/20 1636 08/07/20 1415 08/07/20 1907  Weight: 87.5 kg 87.5 kg 78 kg    Examination:  General:  Elderly appearing male in  NAD Neuro:  Alert, oriented to self, place. Not oriented to time.  HEENT:  [place]/AT, No JVD noted, PERRL Cardiovascular:  Tachy, regular, no MRG Lungs:  Clear Abdomen:  Soft, non-distended, non-tender Musculoskeletal:  No acute deformity. Extremities warm with brisk cap refill distal to injuries.  Skin:  Intact, MMM  Resolved Hospital Problem list     Assessment & Plan:  Hypotension: resolved, perioperative hypotension. Required phenylephrine during the case, has not required since transfer to ICU.  - IVF maintenance  - Cefazolin for surgical prophylaxis.  Hold off on additional antibiotics as its not clear if he is an infection - Continue outpatient diltiazem, Lopressor per cardiology - Echo pending   Tachycardia: evaluated by cardiology. Felt not to represent AF as originally thought. Telemetry more consistent with ST and frequent PVCs as well as runs of PAT. Thought to be secondary to fracture, pain, post-op.  - Echo - Amio for now - IV metoprolol  Left femoral neck, radial head, humerus fractures - Ortho managing.   Dehydration, AKI > improving. Baseline creatinine seems to be about 1.1  - IVF  Diabetes Mild DKA resolved - Continue insulin, monitor sugars  Hyperlipidemia - Continue statin  History of CVA with hemiparesis - Monitor neuro status  PCCM will sign off. Please re-consult if indicated.   Best practice:  Diet: Per primary Pain/Anxiety/Delirium protocol (if indicated): NA VAP protocol (if indicated): NA DVT prophylaxis: SCDs GI prophylaxis: NA Glucose control: Insulin, SSI Mobility: Bed Code Status: Full Family Communication: Per primary team Disposition: SDU  Critical care time: NA     Joneen Roach, AGACNP-BC [facility]  See Loretha Stapler  for personal pager PCCM on call pager [phone]  08/09/2020 8:00 AM

## 2020-08-09 NOTE — Progress Notes (Signed)
Progress Note  Patient Name: Marcus Downs. Date of Encounter: 08/09/2020  [facility] Cardiologist: Dr Jens Som  Subjective   Pt denies CP or dyspnea; complains of leg pain  Inpatient Medications    Scheduled Meds: . aspirin EC  325 mg Oral BID PC  . atorvastatin  40 mg Oral Daily  . Chlorhexidine Gluconate Cloth  6 each Topical Daily  . docusate sodium  100 mg Oral BID  . ferrous sulfate  325 mg Oral BID WC  . insulin aspart  0-20 Units Subcutaneous TID PC & HS  . insulin aspart protamine- aspart  25 Units Subcutaneous BID WC  . latanoprost  1 drop Both Eyes QHS  . mouth rinse  15 mL Mouth Rinse BID  . metoprolol tartrate  2.5 mg Intravenous Q6H  . nystatin  5 mL Oral QID   Continuous Infusions: . amiodarone 30 mg/hr (08/09/20 0800)  . dextrose 75 mL/hr at 08/09/20 0800  . methocarbamol (ROBAXIN) IV     PRN Meds: acetaminophen, alum & mag hydroxide-simeth, HYDROmorphone (DILAUDID) injection, magnesium hydroxide, methocarbamol **OR** methocarbamol (ROBAXIN) IV, metoCLOPramide **OR** metoCLOPramide (REGLAN) injection, morphine injection, ondansetron **OR** ondansetron (ZOFRAN) IV, oxyCODONE, oxyCODONE, traZODone   Vital Signs    Vitals:   08/09/20 0600 08/09/20 0700 08/09/20 0800 08/09/20 1000  BP: (!) 129/50 (!) 104/43 140/64 129/73  Pulse:  (!) 108    Resp: 16 19 19  (!) 21  Temp:   (!) 100.4 F (38 C)   TempSrc:   Axillary   SpO2: 96% 97% 99% 95%  Weight:      Height:        Intake/Output Summary (Last 24 hours) at 08/09/2020 1220 Last data filed at 08/09/2020 0800 Gross per 24 hour  Intake 1860.14 ml  Output 3550 ml  Net -1689.86 ml   Last 3 Weights 08/07/2020 08/07/2020 08/06/2020  Weight (lbs) 171 lb 15.3 oz 192 lb 14.4 oz 193 lb  Weight (kg) 78 kg 87.5 kg 87.544 kg      Telemetry    Sinus tachycardia with PVCs.- Personally Reviewed   Physical Exam   GEN: No acute distress.   Neck: No JVD Cardiac:  Tachycardic and irregular Respiratory:  Clear to auscultation bilaterally. GI: Soft, nontender, non-distended  MS: No edema; status post repair of left femoral neck fracture. Neuro:   Residual left upper and left lower extremity weakness from prior CVA Psych: Normal affect   Labs    High Sensitivity Troponin:   Recent Labs  Lab 08/07/20 1918  TROPONINIHS 38*      Chemistry Recent Labs  Lab 08/06/20 1801 08/06/20 1808 08/07/20 1918 08/08/20 0026 08/09/20 0246  NA 142   < > 153* 151* 144  K 4.7   < > 4.5 3.9 3.3*  CL 103   < > 109 112* 106  CO2 19*   < > 25 26 27   GLUCOSE 469*   < > 108* 119* 174*  BUN 49*   < > 28* 24* 18  CREATININE 1.74*   < > 1.26* 1.13 1.21  CALCIUM 8.9   < > 8.9 8.7* 7.8*  PROT 8.7*  --   --   --   --   ALBUMIN 3.5  --   --   --   --   AST 62*  --   --   --   --   ALT 43  --   --   --   --   ALKPHOS 172*  --   --   --   --  BILITOT 1.5*  --   --   --   --   GFRNONAA 37*   < > 54* >60 57*  GFRAA 43*   < > >60 >60 >60  ANIONGAP 20*   < > 19* 13 11   < > = values in this interval not displayed.     Hematology Recent Labs  Lab 08/07/20 0500 08/08/20 0026 08/09/20 0246  WBC 11.2* 10.3 10.6*  RBC 5.18 5.36 4.89  HGB 13.2 13.7 12.4*  HCT 38.9* 40.5 36.4*  MCV 75.1* 75.6* 74.4*  MCH 25.5* 25.6* 25.4*  MCHC 33.9 33.8 34.1  RDW 20.1* 20.5* 19.8*  PLT 203 190 133*     Radiology    Pelvis Portable  Result Date: 08/07/2020 CLINICAL DATA:  Status post left hip replacement EXAM: PORTABLE PELVIS 1 VIEWS COMPARISON:  08/06/2020 FINDINGS: Left hip prosthesis is now seen. No acute fracture or dislocation is seen. No soft tissue abnormality is noted. IMPRESSION: Status post left hip replacement Electronically Signed   By: Alcide Clever M.D.   On: 08/07/2020 19:42   ECHOCARDIOGRAM LIMITED  Result Date: 08/08/2020    ECHOCARDIOGRAM LIMITED REPORT   Patient Name:   Marcus Downs. Date of Exam: 08/08/2020 Medical Rec #:  [id number]         Height:       71.5 in Accession #:    [id number]         Weight:       172.0 lb Date of Birth:  01-02-1941          BSA:          1.988 m Patient Age:    79 years          BP:           1126/89 mmHg Patient Gender: M                 HR:           130 bpm. Exam Location:  Inpatient Procedure: 2D Echo Indications:    Abnormal EKG  History:        Patient has prior history of Echocardiogram examinations, most                 recent 03/26/2013. Stroke, Arrythmias:Atrial Fibrillation and                 Tachycardia; Risk Factors:Diabetes and Hypertension. S/p Hip                 surgery.  Sonographer:    Lavenia Atlas Referring Phys: [id number] Alessandra Bevels IMPRESSIONS  1. There is incessant arrhythmia with varying QRS morphology (appears to be atrial fibrillation with frequent PVCs), that makes assessment of regional and global left ventricular function more challenging. Left ventricular ejection fraction, by estimation, is 55 to 60%. The left ventricle has normal function. The left ventricle has no regional wall motion abnormalities. Left ventricular diastolic function could not be evaluated.  2. Right ventricular systolic function is normal. The right ventricular size is normal. Tricuspid regurgitation signal is inadequate for assessing PA pressure.  3. The mitral valve is normal in structure. Mild mitral valve regurgitation.  4. The aortic valve is grossly normal. There is mild calcification of the aortic valve. Aortic valve regurgitation is not visualized. Mild aortic valve sclerosis is present, with no evidence of aortic valve stenosis. FINDINGS  Left Ventricle: There is incessant arrhythmia with varying QRS morphology (appears to be atrial  fibrillation with frequent PVCs), that makes assessment of regional and global left ventricular function more challenging. Left ventricular ejection fraction, by estimation, is 55 to 60%. The left ventricle has normal function. The left ventricle has no regional wall motion abnormalities. There is no left ventricular  hypertrophy. Left ventricular diastolic function could not be evaluated. Left ventricular diastolic function could not be evaluated due to atrial fibrillation. Right Ventricle: The right ventricular size is normal. No increase in right ventricular wall thickness. Right ventricular systolic function is normal. Tricuspid regurgitation signal is inadequate for assessing PA pressure. Right Atrium: Right atrial size was normal in size. Pericardium: There is no evidence of pericardial effusion. Mitral Valve: The mitral valve is normal in structure. Mild mitral valve regurgitation. Tricuspid Valve: The tricuspid valve is grossly normal. Tricuspid valve regurgitation is not demonstrated. Aortic Valve: The aortic valve is grossly normal. There is mild calcification of the aortic valve. Aortic valve regurgitation is not visualized. Mild aortic valve sclerosis is present, with no evidence of aortic valve stenosis. Pulmonic Valve: The pulmonic valve was not well visualized. Pulmonic valve regurgitation is not visualized. Aorta: The aortic root and ascending aorta are structurally normal, with no evidence of dilitation. Venous: The inferior vena cava was not well visualized. IAS/Shunts: The interatrial septum was not assessed. Thurmon Fair MD Electronically signed by Thurmon Fair MD Signature Date/Time: 08/08/2020/3:12:26 PM    Final     Patient Profile     79 y.o. male with past medical history of diabetes mellitus, hypertension, hyperlipidemia, chronic renal insufficiency, prior CVA and status post repair of left femoral neck fracture for evaluation of atrial fibrillation.  Echocardiogram this admission shows ejection fraction 55 to 60%, mild mitral regurgitation.  Assessment & Plan    1. Tachycardia-I have again personally reviewed the patient's telemetry. This morning he remains in sinus tachycardia with PVCs. He remains asymptomatic. I will discontinue his IV amiodarone. Continue metoprolol. Note his LV function  is normal. Some of his tachycardia is likely being driven by the hyperadrenergic state associated with recent femoral neck fracture, pain and low-grade fever.  2. Hypertension-blood pressure reasonably well controlled. Change IV metoprolol to metoprolol 25 mg twice daily and follow. 3. Acute kidney injury-renal function has improved with hydration. 4. Hypernatremia-improving. Continue to follow. 5. Status post repair of femoral neck fracture-Per orthopedics.  For questions or updates, please contact [facility] Please consult [email or web address] for contact info under        Signed, Olga Millers, MD  08/09/2020, 12:20 PM

## 2020-08-10 LAB — COMPREHENSIVE METABOLIC PANEL
ALT: 36 U/L (ref 0–44)
AST: 89 U/L — ABNORMAL HIGH (ref 15–41)
Albumin: 2.3 g/dL — ABNORMAL LOW (ref 3.5–5.0)
Alkaline Phosphatase: 116 U/L (ref 38–126)
Anion gap: 12 (ref 5–15)
BUN: 26 mg/dL — ABNORMAL HIGH (ref 8–23)
CO2: 27 mmol/L (ref 22–32)
Calcium: 8 mg/dL — ABNORMAL LOW (ref 8.9–10.3)
Chloride: 110 mmol/L (ref 98–111)
Creatinine, Ser: 1.4 mg/dL — ABNORMAL HIGH (ref 0.61–1.24)
GFR calc Af Amer: 55 mL/min — ABNORMAL LOW (ref >60)
GFR calc non Af Amer: 48 mL/min — ABNORMAL LOW (ref >60)
Glucose, Bld: 60 mg/dL — ABNORMAL LOW (ref 70–99)
Potassium: 3.4 mmol/L — ABNORMAL LOW (ref 3.5–5.1)
Sodium: 149 mmol/L — ABNORMAL HIGH (ref 135–145)
Total Bilirubin: 1.4 mg/dL — ABNORMAL HIGH (ref 0.3–1.2)
Total Protein: 6.5 g/dL (ref 6.5–8.1)

## 2020-08-10 LAB — MAGNESIUM: Magnesium: 2.6 mg/dL — ABNORMAL HIGH (ref 1.7–2.4)

## 2020-08-10 LAB — CBC
HCT: 36.1 % — ABNORMAL LOW (ref 39.0–52.0)
Hemoglobin: 12.3 g/dL — ABNORMAL LOW (ref 13.0–17.0)
MCH: 25.6 pg — ABNORMAL LOW (ref 26.0–34.0)
MCHC: 34.1 g/dL (ref 30.0–36.0)
MCV: 75.1 fL — ABNORMAL LOW (ref 80.0–100.0)
Platelets: 156 10*3/uL (ref 150–400)
RBC: 4.81 MIL/uL (ref 4.22–5.81)
RDW: 19.8 % — ABNORMAL HIGH (ref 11.5–15.5)
WBC: 8.8 10*3/uL (ref 4.0–10.5)
nRBC: 0 % (ref 0.0–0.2)

## 2020-08-10 LAB — GLUCOSE, CAPILLARY
Glucose-Capillary: 66 mg/dL — ABNORMAL LOW (ref 70–99)
Glucose-Capillary: 87 mg/dL (ref 70–99)
Glucose-Capillary: 116 mg/dL — ABNORMAL HIGH (ref 70–99)
Glucose-Capillary: 186 mg/dL — ABNORMAL HIGH (ref 70–99)
Glucose-Capillary: 95 mg/dL (ref 70–99)
Glucose-Capillary: 52 mg/dL — ABNORMAL LOW (ref 70–99)
Glucose-Capillary: 70 mg/dL (ref 70–99)

## 2020-08-10 MED ORDER — CHLORHEXIDINE GLUCONATE CLOTH 2 % EX PADS
6.0000 | MEDICATED_PAD | Freq: Every day | CUTANEOUS | Status: DC
  Administered 2020-08-10 – 2020-08-14 (×5): 6 via TOPICAL

## 2020-08-10 MED ORDER — INSULIN ASPART PROT & ASPART (70-30 MIX) 100 UNIT/ML ~~LOC~~ SUSP
15.0000 [IU] | Freq: Two times a day (BID) | SUBCUTANEOUS | Status: DC
  Administered 2020-08-10: 15 [IU] via SUBCUTANEOUS

## 2020-08-10 MED ORDER — POTASSIUM CHLORIDE 20 MEQ PO PACK
20.0000 meq | PACK | Freq: Every day | ORAL | Status: DC
  Administered 2020-08-10 – 2020-08-14 (×5): 20 meq via ORAL
  Filled 2020-08-10 (×5): qty 1

## 2020-08-10 NOTE — Progress Notes (Signed)
Hypoglycemia Event   CBG: 66    Treatment: 4 oz juice    Symptoms: None    Follow-Up CBG time: 0623  CBG Results:  87    Possible Reasons for Event: Poor PO intake   Comments/MD notified: Noticed Am labs glucose  low, checked with CBG machine to verify.

## 2020-08-10 NOTE — NC FL2 (Signed)
[place] MEDICAID FL2 LEVEL OF CARE SCREENING TOOL     IDENTIFICATION  Patient Name: Marcus Downs. Birthdate: Dec 07, 1940 Sex: male Admission Date (Current Location): 08/06/2020  [facility] and [place] Number:  Producer, television/film/video and Address:  [facility],  [address] [place] [address]      Provider Number: [id number]  Attending Physician Name and Address:  Rhetta Mura, MD  Relative Name and Phone Number:       Current Level of Care: Hospital Recommended Level of Care: Skilled Nursing Facility Prior Approval Number:    Date Approved/Denied:   PASRR Number: [id number]  Discharge Plan: SNF    Current Diagnoses: Patient Active Problem List   Diagnosis Date Noted  . Tachycardia   . Status post hip hemiarthroplasty 08/07/2020  . S/P hip hemiarthroplasty 08/07/2020  . Pressure injury of skin 08/07/2020  . Closed left hip fracture (HCC) 08/06/2020  . Elevated alkaline phosphatase level 11/01/2019  . Type 2 diabetes mellitus with vascular disease (HCC) 08/16/2019  . Diabetes mellitus with cataract (HCC) 08/05/2019  . Weight loss 04/30/2019  . Iron deficiency anemia 04/29/2018  . Pedal edema 07/11/2017  . Primary open angle glaucoma   . Advanced care planning/counseling discussion 02/20/2015  . Benign prostatic hyperplasia 02/20/2015  . Falls 11/08/2013  . Medicare annual wellness visit, subsequent 05/25/2012  . 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 Pain due to onychomycosis of toenails of both feet 09/10/2010  . Renal insufficiency 04/13/2009  . GERD 03/30/2009  . Controlled diabetes mellitus type 2 with complications (HCC) 05/04/2007  . Hyperlipidemia associated with type 2 diabetes mellitus (HCC) 05/04/2007  . Essential hypertension 05/04/2007  . SYMPTOM, INCONTINENCE, MIXED, URGE/STRESS 05/04/2007  . Ex-smoker 05/04/2007    Orientation RESPIRATION BLADDER Height & Weight     Self, Time  Normal  Incontinent Weight: 78 kg Height:  5' 11.5" (181.6 cm)  BEHAVIORAL SYMPTOMS/MOOD NEUROLOGICAL BOWEL NUTRITION STATUS      Continent  Control and instrumentation engineer)  AMBULATORY STATUS COMMUNICATION OF NEEDS Skin   Extensive Assist Verbally PU Stage and Appropriate Care   PU Stage 2 Dressing:  (foam dressing in place, change q5 days)                   Personal Care Assistance Level of Assistance  Bathing, Dressing, Total care Bathing Assistance: Maximum assistance   Dressing Assistance: Maximum assistance Total Care Assistance: Maximum assistance   Functional Limitations Info  Sight, Speech, Hearing Sight Info: Adequate Hearing Info: Adequate Speech Info: Impaired    SPECIAL CARE FACTORS FREQUENCY  PT (By licensed PT), OT (By licensed OT)     PT Frequency: 5x weekly OT Frequency: 5x weekly            Contractures Contractures Info: Not present    Additional Factors Info  Code Status, Allergies, Insulin Sliding Scale Code Status Info: full Allergies Info: Tape   Insulin Sliding Scale Info: Novolog sliding scale       Current Medications (08/10/2020):  This is the current hospital active medication list Current Facility-Administered Medications  Medication Dose Route Frequency Provider Last Rate Last Admin  . acetaminophen (TYLENOL) tablet 325-650 mg  325-650 mg Oral Q6H PRN Shanon Payor, PA-C      . alum & mag hydroxide-simeth (MAALOX/MYLANTA) 200-200-20 MG/5ML suspension 30 mL  30 mL Oral Q4H PRN Shanon Payor, PA-C      . aspirin EC tablet 325 mg  325 mg Oral BID  PC Shanon Payor, PA-C   325 mg at 08/10/20 0908  . atorvastatin (LIPITOR) tablet 40 mg  40 mg Oral Daily Mansy, Jan A, MD   40 mg at 08/10/20 0909  . Chlorhexidine Gluconate Cloth 2 % PADS 6 each  6 each Topical Daily [name], MD   6 each at 08/09/20 2202  . ferrous sulfate tablet 325 mg  325 mg Oral BID WC Shanon Payor, PA-C   325 mg at 08/10/20 0910  . insulin aspart (novoLOG) injection 0-20  Units  0-20 Units Subcutaneous TID PC & HS Mansy, Vernetta Honey, MD   4 Units at 08/10/20 1254  . insulin aspart protamine- aspart (NOVOLOG MIX 70/30) injection 15 Units  15 Units Subcutaneous BID WC Samtani, Jai-Gurmukh, MD      . latanoprost (XALATAN) 0.005 % ophthalmic solution 1 drop  1 drop Both Eyes QHS Mansy, Jan A, MD   1 drop at 08/09/20 2203  . magnesium hydroxide (MILK OF MAGNESIA) suspension 30 mL  30 mL Oral Daily PRN Mansy, Jan A, MD      . MEDLINE mouth rinse  15 mL Mouth Rinse BID [name], MD   15 mL at 08/09/20 2203  . methocarbamol (ROBAXIN) tablet 500 mg  500 mg Oral Q6H PRN Shanon Payor, PA-C      . metoprolol tartrate (LOPRESSOR) tablet 25 mg  25 mg Oral BID Lewayne Bunting, MD   25 mg at 08/10/20 0909  . nystatin (MYCOSTATIN) 100000 UNIT/ML suspension 500,000 Units  5 mL Oral QID Alessandra Bevels, MD   500,000 Units at 08/10/20 0912  . ondansetron (ZOFRAN) tablet 4 mg  4 mg Oral Q6H PRN Shanon Payor, PA-C      . oxyCODONE (Oxy IR/ROXICODONE) immediate release tablet 10-15 mg  10-15 mg Oral Q4H PRN Shanon Payor, PA-C      . oxyCODONE (Oxy IR/ROXICODONE) immediate release tablet 5-10 mg  5-10 mg Oral Q4H PRN Shanon Payor, PA-C   5 mg at 08/09/20 2313  . polyethylene glycol (MIRALAX / GLYCOLAX) packet 17 g  17 g Oral Daily Rhetta Mura, MD   17 g at 08/10/20 0913  . potassium chloride (KLOR-CON) packet 20 mEq  20 mEq Oral Daily Rhetta Mura, MD   20 mEq at 08/10/20 1254  . traZODone (DESYREL) tablet 25 mg  25 mg Oral QHS PRN Mansy, Vernetta Honey, MD         Discharge Medications: Please see discharge summary for a list of discharge medications.  Relevant Imaging Results:  Relevant Lab Results:   Additional Information SSN [id number]  Lanier Clam, RN

## 2020-08-10 NOTE — Progress Notes (Signed)
350 ml's noted in condom catheter. Bladder scan performed. 279 ml's in bladder. Dr. Mahala Menghini made aware.

## 2020-08-10 NOTE — TOC Progression Note (Signed)
Transition of Care (TOC) - Progression Note    Patient Details  Name: Marcus Downs. MRN: [id number] Date of Birth: Nov 03, 1941  Transition of Care [facility]) CM/SW Contact  Angelee Bahr, Olegario Messier, RN Phone Number: 08/10/2020, 3:06 PM  Clinical Narrative: Faxed out await bed offers.           Expected Discharge Plan and Services                                                 Social Determinants of Health (SDOH) Interventions    Readmission Risk Interventions No flowsheet data found.

## 2020-08-10 NOTE — Progress Notes (Signed)
Hypoglycemic event  CBG: 52  Treatment: 8oz OJ Notified Blount, NP Follow up CBG: 70 Possible causes: Poor po intake, Will continue to encourage oral intake

## 2020-08-10 NOTE — Discharge Instructions (Signed)
Patient may be weightbearing as tolerated on the left lower extremity.  He is hemiparetic so that is complex.  There are no restrictions to his bed mobility, movement into a wheelchair or bedside chair.  There are no restrictions to his attempting to stand and he is able to put full weight on that left lower extremity as a pivot assist

## 2020-08-10 NOTE — Progress Notes (Addendum)
Progress Note  Patient Name: Marcus LankSamuel J Gerken Jr. Date of Encounter: 08/10/2020  Primary Cardiologist: Olga MillersBrian Lemarcus Baggerly, MD   Subjective   No complaints this morning. Denies chest pain, SOB, palpitations, or pain in his hip/shoulder.  Inpatient Medications    Scheduled Meds: . aspirin EC  325 mg Oral BID PC  . atorvastatin  40 mg Oral Daily  . Chlorhexidine Gluconate Cloth  6 each Topical Daily  . ferrous sulfate  325 mg Oral BID WC  . insulin aspart  0-20 Units Subcutaneous TID PC & HS  . insulin aspart protamine- aspart  25 Units Subcutaneous BID WC  . latanoprost  1 drop Both Eyes QHS  . mouth rinse  15 mL Mouth Rinse BID  . metoprolol tartrate  25 mg Oral BID  . nystatin  5 mL Oral QID  . polyethylene glycol  17 g Oral Daily   Continuous Infusions: . sodium chloride Stopped (08/09/20 2137)   PRN Meds: acetaminophen, alum & mag hydroxide-simeth, magnesium hydroxide, methocarbamol **OR** [DISCONTINUED] methocarbamol (ROBAXIN) IV, ondansetron **OR** [DISCONTINUED] ondansetron (ZOFRAN) IV, oxyCODONE, oxyCODONE, traZODone   Vital Signs    Vitals:   08/09/20 1848 08/09/20 2303 08/10/20 0220 08/10/20 0605  BP: 102/65 122/61 (!) 102/56 102/61  Pulse: (!) 117 (!) 118 (!) 106 (!) 102  Resp: 18 18 18 18   Temp: 99.7 F (37.6 C) 98.6 F (37 C) 97.6 F (36.4 C) (!) 97.5 F (36.4 C)  TempSrc:      SpO2: 99% 98% 95% 100%  Weight:      Height:        Intake/Output Summary (Last 24 hours) at 08/10/2020 0852 Last data filed at 08/10/2020 0605 Gross per 24 hour  Intake 904.5 ml  Output 2325 ml  Net -1420.5 ml   Filed Weights   08/06/20 1636 08/07/20 1415 08/07/20 1907  Weight: 87.5 kg 87.5 kg 78 kg    Telemetry    Sinus rhythm/sinus tachycardia with occasional PVCs. One brief episode of PAT noted - Personally Reviewed  ECG    No new tracings - Personally Reviewed  Physical Exam   GEN: Sitting upright in bed in no acute distress.   Neck: No JVD, no carotid  bruits Cardiac: tachycardic, regular rhythm, no murmurs, rubs, or gallops.  Respiratory: Clear to auscultation bilaterally, no wheezes/ rales/ rhonchi GI: NABS, Soft, nontender, non-distended  MS: No edema; No deformity. Neuro:  Nonfocal, moving all extremities spontaneously Psych: Normal affect   Labs    Chemistry Recent Labs  Lab 08/06/20 1801 08/06/20 1808 08/08/20 0026 08/09/20 0246 08/10/20 0452  NA 142   < > 151* 144 149*  K 4.7   < > 3.9 3.3* 3.4*  CL 103   < > 112* 106 110  CO2 19*   < > 26 27 27   GLUCOSE 469*   < > 119* 174* 60*  BUN 49*   < > 24* 18 26*  CREATININE 1.74*   < > 1.13 1.21 1.40*  CALCIUM 8.9   < > 8.7* 7.8* 8.0*  PROT 8.7*  --   --   --  6.5  ALBUMIN 3.5  --   --   --  2.3*  AST 62*  --   --   --  89*  ALT 43  --   --   --  36  ALKPHOS 172*  --   --   --  116  BILITOT 1.5*  --   --   --  1.4*  GFRNONAA 37*   < > >60 57* 48*  GFRAA 43*   < > >60 >60 55*  ANIONGAP 20*   < > 13 11 12    < > = values in this interval not displayed.     Hematology Recent Labs  Lab 08/08/20 0026 08/09/20 0246 08/10/20 0452  WBC 10.3 10.6* 8.8  RBC 5.36 4.89 4.81  HGB 13.7 12.4* 12.3*  HCT 40.5 36.4* 36.1*  MCV 75.6* 74.4* 75.1*  MCH 25.6* 25.4* 25.6*  MCHC 33.8 34.1 34.1  RDW 20.5* 19.8* 19.8*  PLT 190 133* 156    Cardiac EnzymesNo results for input(s): TROPONINI in the last 168 hours. No results for input(s): TROPIPOC in the last 168 hours.   BNPNo results for input(s): BNP, PROBNP in the last 168 hours.   DDimer No results for input(s): DDIMER in the last 168 hours.   Radiology    DG CHEST PORT 1 VIEW  Result Date: 08/09/2020 CLINICAL DATA:  Hyperglycemia EXAM: PORTABLE CHEST 1 VIE COMPARISON:  August 06, 2020 FINDINGS: Lungs are clear. Heart size and pulmonary vascularity are normal. No adenopathy. No bone lesions. IMPRESSION: Lungs clear. Cardiac silhouette within normal limits. No adenopathy. Electronically Signed   By: August 08, 2020 III  M.D.   On: 08/09/2020 14:35   ECHOCARDIOGRAM LIMITED  Result Date: 08/08/2020    ECHOCARDIOGRAM LIMITED REPORT   Patient Name:   Marcus Downs. Date of Exam: 08/08/2020 Medical Rec #:  08/10/2020         Height:       71.5 in Accession #:    [id number]        Weight:       172.0 lb Date of Birth:  July 27, 1941          BSA:          1.988 m Patient Age:    79 years          BP:           1126/89 mmHg Patient Gender: M                 HR:           130 bpm. Exam Location:  Inpatient Procedure: 2D Echo Indications:    Abnormal EKG  History:        Patient has prior history of Echocardiogram examinations, most                 recent 03/26/2013. Stroke, Arrythmias:Atrial Fibrillation and                 Tachycardia; Risk Factors:Diabetes and Hypertension. S/p Hip                 surgery.  Sonographer:    05/26/2013 Referring Phys: Lavenia Atlas [id number] IMPRESSIONS  1. There is incessant arrhythmia with varying QRS morphology (appears to be atrial fibrillation with frequent PVCs), that makes assessment of regional and global left ventricular function more challenging. Left ventricular ejection fraction, by estimation, is 55 to 60%. The left ventricle has normal function. The left ventricle has no regional wall motion abnormalities. Left ventricular diastolic function could not be evaluated.  2. Right ventricular systolic function is normal. The right ventricular size is normal. Tricuspid regurgitation signal is inadequate for assessing PA pressure.  3. The mitral valve is normal in structure. Mild mitral valve regurgitation.  4. The aortic valve is grossly normal. There is mild calcification of the  aortic valve. Aortic valve regurgitation is not visualized. Mild aortic valve sclerosis is present, with no evidence of aortic valve stenosis. FINDINGS  Left Ventricle: There is incessant arrhythmia with varying QRS morphology (appears to be atrial fibrillation with frequent PVCs), that makes assessment of regional  and global left ventricular function more challenging. Left ventricular ejection fraction, by estimation, is 55 to 60%. The left ventricle has normal function. The left ventricle has no regional wall motion abnormalities. There is no left ventricular hypertrophy. Left ventricular diastolic function could not be evaluated. Left ventricular diastolic function could not be evaluated due to atrial fibrillation. Right Ventricle: The right ventricular size is normal. No increase in right ventricular wall thickness. Right ventricular systolic function is normal. Tricuspid regurgitation signal is inadequate for assessing PA pressure. Right Atrium: Right atrial size was normal in size. Pericardium: There is no evidence of pericardial effusion. Mitral Valve: The mitral valve is normal in structure. Mild mitral valve regurgitation. Tricuspid Valve: The tricuspid valve is grossly normal. Tricuspid valve regurgitation is not demonstrated. Aortic Valve: The aortic valve is grossly normal. There is mild calcification of the aortic valve. Aortic valve regurgitation is not visualized. Mild aortic valve sclerosis is present, with no evidence of aortic valve stenosis. Pulmonic Valve: The pulmonic valve was not well visualized. Pulmonic valve regurgitation is not visualized. Aorta: The aortic root and ascending aorta are structurally normal, with no evidence of dilitation. Venous: The inferior vena cava was not well visualized. IAS/Shunts: The interatrial septum was not assessed. Thurmon Fair MD Electronically signed by Thurmon Fair MD Signature Date/Time: 08/08/2020/3:12:26 PM    Final     Cardiac Studies   Echocardiogram 08/08/20: 1. There is incessant arrhythmia with varying QRS morphology (appears to  be atrial fibrillation with frequent PVCs), that makes assessment of  regional and global left ventricular function more challenging. Left  ventricular ejection fraction, by  estimation, is 55 to 60%. The left ventricle  has normal function. The left  ventricle has no regional wall motion abnormalities. Left ventricular  diastolic function could not be evaluated.  2. Right ventricular systolic function is normal. The right ventricular  size is normal. Tricuspid regurgitation signal is inadequate for assessing  PA pressure.  3. The mitral valve is normal in structure. Mild mitral valve  regurgitation.  4. The aortic valve is grossly normal. There is mild calcification of the  aortic valve. Aortic valve regurgitation is not visualized. Mild aortic  valve sclerosis is present, with no evidence of aortic valve stenosis.   Patient Profile     79 y.o. male with past medical history of diabetes mellitus, hypertension, hyperlipidemia, chronic renal insufficiency, prior CVA and status post repair of left femoral neck fracture for evaluation of atrial fibrillation.  Echocardiogram this admission shows ejection fraction 55 to 60%, mild mitral regurgitation.  Assessment & Plan    1. Tachycardia: telemetry this admission with sinus tachycardia with frequent PVCs and runs of PAT, though no evidence of atrial fibrillation. He remains asymptomatic. Echo this admission showed EF 55-60%, no RWMA, indeterminate LV diastolic function, and mild MR. Initially managed with IV diltiazem and IV amiodarone, both discontinued. IV metoprolol transitioned to po yesterday. Tachycardia felt to be driven by the hyperadrenergic state associated with recent femoral neck fracture, pain, and low-grade fever. HR remains in the 100s-110s.  - Continue metoprolol tartrate 25mg  BID - low threshold to uptitrate vs add home diltiazem  2. HTN: BP intermittently elevated. Home amlodipine, diltiazem, HCTZ, and losartan on hold  given hypotension this admission - Continue to monitor for now  3. Fever: still spiking fevers over the past 24 hours - Tmax 101.1. Unclear etiology. Could consider VQ scan to r/o PE given post-op status, fever, and tachycardia   - Will defer work-up/management to primary team  4. AKI: Cr 1.4 today; baseline appears to be 1.2.  - Continue to avoid nephrotoxic agents  4. Fall s/p Femoral neck and humerus fracture: s/p hip fx repair 08/07/20 - Continue management per ortho  For questions or updates, please contact [facility] Please consult [email or web address] for contact info under Cardiology/STEMI.      Signed, Beatriz Stallion, PA-C  08/10/2020, 8:52 AM   [phone] As above, patient seen and examined.  Patient denies dyspnea, chest pain or palpitations.  I have again personally reviewed his telemetry.  He now has sinus rhythm to sinus tachycardia with PVCs.  Heart rate is improving.  We will continue beta-blocker.  We will sign off.  Would continue metoprolol at present dose at discharge.  I do not think he requires cardiology follow-up.  Please call with questions. Olga Millers

## 2020-08-10 NOTE — Progress Notes (Signed)
Occupational Therapy Treatment Patient Details Name: Marcus Downs. MRN: [id number] DOB: February 02, 1941 Today's Date: 08/10/2020    History of present illness 79 year old with diabetes, hypertension, hyperlipidemia, CVA presenting with fall, L humerus and hip fracture. s/p L hip hemiarthroplasty 08/07/20. Noted to be significantly hypertensive with tachycardia and AKI secondary to dehydration and DKA   OT comments  Patient supine in bed without complaints of pain when therapist entered the room. Treatment initially focused on improving patient's activity tolerance with functional mobility. With attempts at transfer to left side of the bed - patient exhibiting significant pain behaviors with any movement of LLE and no assistance with transfer. Transfer had to be aborted due to lack of participation. Patient dependent for positioning in bed. Patient able to wash his face supine in bed with right hand but refused to wash his left hand - despite encouragement from therapist. Patient requiring near total care at this time. Recommend short term rehab at discharge.    Follow Up Recommendations  SNF    Equipment Recommendations  Other (comment) (defer to next venue)    Recommendations for Other Services      Precautions / Restrictions Precautions Precautions: Posterior Hip Restrictions Weight Bearing Restrictions: Yes LLE Weight Bearing: Weight bearing as tolerated LLE Partial Weight Bearing Percentage or Pounds: Per Dr. Luiz Blare 9/23 [place] Note patient WBAT on LLE       Mobility Bed Mobility Overal bed mobility: Needs Assistance             General bed mobility comments: Attempted to perform supine to sit - patient exhibits pain behavior with any movement of LLE/ Transfer had to be aborted due to patient's lack of participation. Dependent to reposition in bed.  Transfers                      Balance                                           ADL either  performed or assessed with clinical judgement   ADL       Grooming: Set up;Wash/dry face;Moderate assistance Grooming Details (indicate cue type and reason): Patient washed his face supine in bed. Therapist set patient up to wash his left hand - but patient refused. Therapist washed his stroke hand for him.                                     Vision   Vision Assessment?: No apparent visual deficits   Perception     Praxis      Cognition   Behavior During Therapy: Flat affect Overall Cognitive Status: No family/caregiver present to determine baseline cognitive functioning                                 General Comments: Oriented to self, hospital and situation. PLOF informations continues to be limited.        Exercises     Shoulder Instructions       General Comments      Pertinent Vitals/ Pain       Pain Assessment: Faces Faces Pain Scale: Hurts even more Pain Location: LLE with movement Pain Descriptors / Indicators: Grimacing;Guarding;Moaning Pain Intervention(s): Monitored  during session;Limited activity within patient's tolerance  Home Living                                          Prior Functioning/Environment              Frequency  Min 2X/week        Progress Toward Goals  OT Goals(current goals can now be found in the care plan section)  Progress towards OT goals: Progressing toward goals  Acute Rehab OT Goals Patient Stated Goal: decrease pain OT Goal Formulation: With patient Time For Goal Achievement: 08/23/20 Potential to Achieve Goals: Fair  Plan Discharge plan remains appropriate    Co-evaluation                 AM-PAC OT "6 Clicks" Daily Activity     Outcome Measure   Help from another person eating meals?: A Lot Help from another person taking care of personal grooming?: A Lot Help from another person toileting, which includes using toliet, bedpan, or urinal?:  Total Help from another person bathing (including washing, rinsing, drying)?: Total Help from another person to put on and taking off regular upper body clothing?: Total Help from another person to put on and taking off regular lower body clothing?: Total 6 Click Score: 8    End of Session    OT Visit Diagnosis: Other abnormalities of gait and mobility (R26.89);Muscle weakness (generalized) (M62.81);History of falling (Z91.81);Pain;Other symptoms and signs involving cognitive function Pain - Right/Left: Left Pain - part of body: Hip   Activity Tolerance Patient limited by pain   Patient Left in bed;with call bell/phone within reach;with bed alarm set   Nurse Communication Mobility status        Time: 2122-4825 OT Time Calculation (min): 15 min  Charges: OT General Charges $OT Visit: 1 Visit OT Treatments $Therapeutic Activity: 8-22 mins  Waldron Session, OTR/L Acute Care Rehab Services  Office [phone] Pager: [phone]    Kelli Churn 08/10/2020, 5:20 PM

## 2020-08-10 NOTE — Progress Notes (Signed)
Subjective: 3 Days Post-Op Procedure(s) (LRB): ARTHROPLASTY BIPOLAR HIP (HEMIARTHROPLASTY) (Left) Patient reports pain as moderate.    Objective: Vital signs in last 24 hours: Temp:  [97.5 F (36.4 C)-101.1 F (38.4 C)] 97.5 F (36.4 C) (09/23 0605) Pulse Rate:  [102-118] 102 (09/23 0605) Resp:  [18-31] 18 (09/23 0605) BP: (102-145)/(55-80) 102/61 (09/23 0605) SpO2:  [93 %-100 %] 100 % (09/23 0605)  Intake/Output from previous day: 09/22 0701 - 09/23 0700 In: 2344.6 [P.O.:120; I.V.:2224.6] Out: 2825 [Urine:2825] Intake/Output this shift: No intake/output data recorded.  Recent Labs    08/08/20 0026 08/09/20 0246 08/10/20 0452  HGB 13.7 12.4* 12.3*   Recent Labs    08/09/20 0246 08/10/20 0452  WBC 10.6* 8.8  RBC 4.89 4.81  HCT 36.4* 36.1*  PLT 133* 156   Recent Labs    08/09/20 0246 08/10/20 0452  NA 144 149*  K 3.3* 3.4*  CL 106 110  CO2 27 27  BUN 18 26*  CREATININE 1.21 1.40*  GLUCOSE 174* 60*  CALCIUM 7.8* 8.0*   No results for input(s): LABPT, INR in the last 72 hours.  ABD soft No cellulitis present Compartment soft   Assessment/Plan: 3 Days Post-Op Procedure(s) (LRB): ARTHROPLASTY BIPOLAR HIP (HEMIARTHROPLASTY) (Left) Advance diet Up with therapy Discharge to SNF There is been some discussion in the chart about the patient's weightbearing status.  The patient may be weightbearing as tolerated on the left side.  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  Posterior hip precautions should be followed but the patient should not be limited in mobility in bed and/or moving to wheelchair and/or attempting to stand.     Harvie Junior 08/10/2020, 8:15 AM

## 2020-08-10 NOTE — Progress Notes (Signed)
PROGRESS NOTE    Marcus Downs.  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 Unclear source of fever a. 101 fever 9/22 blood culture no growth to date b. Chest x-ray 9/22 - for pneumonia c. Keep on dysphagia diet 1 as per speech therapy d. Hold antibiotics at this time as no recurrence 2. Accidental mechanical fall left hip and shoulder fracture a. Mobilize as tolerated with therapy will need skilled placement-not ready for discharge b. Continue sling to left arm c. WBAT to left lower extremity TED hose in addition d. Pain control oxycodone 5-10 every 4 as needed moderate and higher dosing for severe pain e. Discontinue Dilaudid f. DVT prophylaxis aspirin 325 twice daily 3. Sinus tachycardia found to be sinus tach with PAT has been started on amiodarone a. Amiodarone drip used earlier in admission discontinued in favor of metoprolol 25 twice daily-Cardiology s/o 9/23 b. At home is on Cardizem 360 XR 4. Early DKA with diabetes mellitus which is brittle a. No blood sugar of 60 therefore cut back 70/30 insulin to 15 b. Resume on discharge Jardiance 10 daily Metformin 500 XR daily 5. Hypernatremia, hypokalemia as well as AKI a. Stop  IV normal saline he is becoming slightly hypernatremic b. Replace potassium orally klor 20 6. HTN  7. HLD a. Holding Caduet at this time can resume on discharge b. At this time hold HCTZ 25 and Cozaar 100 8. BPH 9. CVA 1997  a. severe deficits on the left side and will more likely than not need skilled care 10. Prior smoker 71. Iron deficiency anemia a. Iron twice a day   DVT prophylaxis: Aspirin, TED hose Code Status:  Family Communication: Called [phone] son Marcus Downs and updated Disposition:   Status is: Inpatient  Remains inpatient appropriate because:Hemodynamically unstable, Persistent severe electrolyte disturbances, Unsafe d/c plan and IV treatments appropriate due to intensity of illness or inability to take PO   Dispo:  Patient From: Home  Planned Disposition: To be determined  Expected discharge date: 08/11/20  Medically stable for discharge: No        Consultants:   Orthopedics  Procedures: Hip repair by Dr. [name] 08/07/2020  Antimicrobials: None currently   Subjective:  A little bit sleepy eating and drinking some No chest pain no fever Does not want me to passively raise his left leg Reinforced the need for high amounts of fluid  Objective: Vitals:   08/09/20 2303 08/10/20 0220 08/10/20 0605 08/10/20 0907  BP: 122/61 (!) 102/56 102/61 124/61  Pulse: (!) 118 (!) 106 (!) 102 (!) 102  Resp: 18 18 18  (!) 26  Temp: 98.6 F (37 C) 97.6 F (36.4 C) (!) 97.5 F (36.4 C) 98.4 F (36.9 C)  TempSrc:      SpO2: 98% 95% 100% 99%  Weight:  Height:        Intake/Output Summary (Last 24 hours) at 08/10/2020 1101 Last data filed at 08/10/2020 9924 Gross per 24 hour  Intake 1264.5 ml  Output 2325 ml  Net -1060.5 ml   Filed Weights   08/06/20 1636 08/07/20 1415 08/07/20 1907  Weight: 87.5 kg 87.5 kg 78 kg    Examination:  General exam: EOMI NCAT slurred speech mouth twisting to the left side Respiratory system: No added sound poor  exam this does not allow me to listen to the back Cardiovascular system: S1-S2 no murmur rub or gallop-on monitors his 100 with PVC Gastrointestinal system: Soft nontender no HSM Central nervous system: Overall weak on left side and limited because of sling and fracture Extremities: ROM intact to right side Skin: No lower extremity edema Psychiatry: Euthymic and congruent  Data Reviewed: I have personally reviewed following labs and imaging studies   Sodium 149 potassium 3.4 BUNs/creatinine down to 18/1.2-->26/1.4 Magnesium 2.6 White count 10.6 hemoglobin 12.4 platelet 133  Radiology Studies: DG CHEST PORT 1 VIEW  Result Date: 08/09/2020 CLINICAL DATA:  Hyperglycemia EXAM: PORTABLE CHEST 1 VIE COMPARISON:  August 06, 2020 FINDINGS: Lungs are clear. Heart size and pulmonary vascularity are normal. No adenopathy. No bone lesions. IMPRESSION: Lungs clear. Cardiac silhouette within normal limits. No adenopathy. Electronically Signed   By: Bretta Bang III M.D.   On: 08/09/2020 14:35   ECHOCARDIOGRAM LIMITED  Result Date: 08/08/2020    ECHOCARDIOGRAM LIMITED REPORT   Patient Name:   Marcus Downs. Date of Exam: 08/08/2020 Medical Rec #:  [id number]         Height:       71.5 in Accession #:    [id number]        Weight:       172.0 lb Date of Birth:  07-Jan-1941          BSA:          1.988 m Patient Age:    79 years          BP:           1126/89 mmHg Patient Gender: M                 HR:           130 bpm. Exam Location:  Inpatient Procedure: 2D Echo Indications:    Abnormal EKG  History:        Patient has prior history of Echocardiogram examinations, most                 recent 03/26/2013. Stroke, Arrythmias:Atrial Fibrillation and                 Tachycardia; Risk Factors:Diabetes and Hypertension. S/p Hip                 surgery.  Sonographer:    Lavenia Atlas Referring Phys: [id number] Alessandra Bevels IMPRESSIONS  1. There is incessant arrhythmia with varying QRS morphology (appears to  be atrial fibrillation with frequent PVCs), that makes assessment of regional and global left ventricular function more challenging. Left ventricular ejection fraction, by estimation, is 55 to 60%. The left ventricle has normal function. The left ventricle has no regional wall motion abnormalities. Left ventricular diastolic function could not be evaluated.  2. Right ventricular systolic function is normal. The right ventricular size is normal. Tricuspid regurgitation signal is inadequate for assessing PA pressure.  3. The mitral valve is normal in structure.  Mild mitral valve regurgitation.  4. The aortic valve is grossly normal. There is mild calcification of the aortic valve. Aortic valve regurgitation is not visualized. Mild aortic valve sclerosis is present, with no evidence of aortic valve stenosis. FINDINGS  Left Ventricle: There is incessant arrhythmia with varying QRS morphology (appears to be atrial fibrillation with frequent PVCs), that makes assessment of regional and global left ventricular function more challenging. Left ventricular ejection fraction, by estimation, is 55 to 60%. The left ventricle has normal function. The left ventricle has no regional wall motion abnormalities. There is no left ventricular hypertrophy. Left ventricular diastolic function could not be evaluated. Left ventricular diastolic function could not be evaluated due to atrial fibrillation. Right Ventricle: The right ventricular size is normal. No increase in right ventricular wall thickness. Right ventricular systolic function is normal. Tricuspid regurgitation signal is inadequate for assessing PA pressure. Right Atrium: Right atrial size was normal in size. Pericardium: There is no evidence of pericardial effusion. Mitral Valve: The mitral valve is normal in structure. Mild mitral valve regurgitation. Tricuspid Valve: The tricuspid valve is grossly normal. Tricuspid valve regurgitation is not demonstrated. Aortic Valve: The  aortic valve is grossly normal. There is mild calcification of the aortic valve. Aortic valve regurgitation is not visualized. Mild aortic valve sclerosis is present, with no evidence of aortic valve stenosis. Pulmonic Valve: The pulmonic valve was not well visualized. Pulmonic valve regurgitation is not visualized. Aorta: The aortic root and ascending aorta are structurally normal, with no evidence of dilitation. Venous: The inferior vena cava was not well visualized. IAS/Shunts: The interatrial septum was not assessed. Thurmon Fair MD Electronically signed by Thurmon Fair MD Signature Date/Time: 08/08/2020/3:12:26 PM    Final      Scheduled Meds: . aspirin EC  325 mg Oral BID PC  . atorvastatin  40 mg Oral Daily  . Chlorhexidine Gluconate Cloth  6 each Topical Daily  . ferrous sulfate  325 mg Oral BID WC  . insulin aspart  0-20 Units Subcutaneous TID PC & HS  . insulin aspart protamine- aspart  15 Units Subcutaneous BID WC  . latanoprost  1 drop Both Eyes QHS  . mouth rinse  15 mL Mouth Rinse BID  . metoprolol tartrate  25 mg Oral BID  . nystatin  5 mL Oral QID  . polyethylene glycol  17 g Oral Daily   Continuous Infusions: . sodium chloride 50 mL/hr at 08/10/20 0943     LOS: 4 days   Time spent: 40  Rhetta Mura, MD [facility] To contact the attending provider between 7A-7P or the covering provider during after hours 7P-7A, please log into the web site [email or web address] and access using universal [place] password for that web site. If you do not have the password, please call the hospital operator.  08/10/2020, 11:01 AM

## 2020-08-11 LAB — GLUCOSE, CAPILLARY
Glucose-Capillary: 210 mg/dL — ABNORMAL HIGH (ref 70–99)
Glucose-Capillary: 150 mg/dL — ABNORMAL HIGH (ref 70–99)
Glucose-Capillary: 237 mg/dL — ABNORMAL HIGH (ref 70–99)
Glucose-Capillary: 227 mg/dL — ABNORMAL HIGH (ref 70–99)

## 2020-08-11 LAB — CBC WITH DIFFERENTIAL/PLATELET
Abs Immature Granulocytes: 0.06 10*3/uL (ref 0.00–0.07)
Basophils Absolute: 0.1 10*3/uL (ref 0.0–0.1)
Basophils Relative: 1 %
Eosinophils Absolute: 0.6 10*3/uL — ABNORMAL HIGH (ref 0.0–0.5)
Eosinophils Relative: 6 %
HCT: 34.3 % — ABNORMAL LOW (ref 39.0–52.0)
Hemoglobin: 11.9 g/dL — ABNORMAL LOW (ref 13.0–17.0)
Immature Granulocytes: 1 %
Lymphocytes Relative: 14 %
Lymphs Abs: 1.5 10*3/uL (ref 0.7–4.0)
MCH: 25.6 pg — ABNORMAL LOW (ref 26.0–34.0)
MCHC: 34.7 g/dL (ref 30.0–36.0)
MCV: 73.8 fL — ABNORMAL LOW (ref 80.0–100.0)
Monocytes Absolute: 1.1 10*3/uL — ABNORMAL HIGH (ref 0.1–1.0)
Monocytes Relative: 10 %
Neutro Abs: 7.1 10*3/uL (ref 1.7–7.7)
Neutrophils Relative %: 68 %
Platelets: 165 10*3/uL (ref 150–400)
RBC: 4.65 MIL/uL (ref 4.22–5.81)
RDW: 19.2 % — ABNORMAL HIGH (ref 11.5–15.5)
WBC: 10.4 10*3/uL (ref 4.0–10.5)
nRBC: 0 % (ref 0.0–0.2)

## 2020-08-11 LAB — COMPREHENSIVE METABOLIC PANEL
ALT: 52 U/L — ABNORMAL HIGH (ref 0–44)
AST: 129 U/L — ABNORMAL HIGH (ref 15–41)
Albumin: 2.2 g/dL — ABNORMAL LOW (ref 3.5–5.0)
Alkaline Phosphatase: 139 U/L — ABNORMAL HIGH (ref 38–126)
Anion gap: 12 (ref 5–15)
BUN: 25 mg/dL — ABNORMAL HIGH (ref 8–23)
CO2: 26 mmol/L (ref 22–32)
Calcium: 7.7 mg/dL — ABNORMAL LOW (ref 8.9–10.3)
Chloride: 108 mmol/L (ref 98–111)
Creatinine, Ser: 1.33 mg/dL — ABNORMAL HIGH (ref 0.61–1.24)
GFR calc Af Amer: 59 mL/min — ABNORMAL LOW (ref >60)
GFR calc non Af Amer: 51 mL/min — ABNORMAL LOW (ref >60)
Glucose, Bld: 171 mg/dL — ABNORMAL HIGH (ref 70–99)
Potassium: 3.7 mmol/L (ref 3.5–5.1)
Sodium: 146 mmol/L — ABNORMAL HIGH (ref 135–145)
Total Bilirubin: 1.2 mg/dL (ref 0.3–1.2)
Total Protein: 6.3 g/dL — ABNORMAL LOW (ref 6.5–8.1)

## 2020-08-11 MED ORDER — INSULIN ASPART PROT & ASPART (70-30 MIX) 100 UNIT/ML ~~LOC~~ SUSP
6.0000 [IU] | Freq: Two times a day (BID) | SUBCUTANEOUS | Status: DC
  Administered 2020-08-11 – 2020-08-14 (×7): 6 [IU] via SUBCUTANEOUS

## 2020-08-11 MED ORDER — INSULIN ASPART 100 UNIT/ML ~~LOC~~ SOLN
0.0000 [IU] | Freq: Three times a day (TID) | SUBCUTANEOUS | Status: DC
  Administered 2020-08-11 (×2): 3 [IU] via SUBCUTANEOUS
  Administered 2020-08-11: 1 [IU] via SUBCUTANEOUS
  Administered 2020-08-12: 3 [IU] via SUBCUTANEOUS

## 2020-08-11 NOTE — TOC Progression Note (Addendum)
Transition of Care (TOC) - Progression Note    Patient Details  Name: Marcus Downs. MRN: [id number] Date of Birth: 1941-06-27  Transition of Care [facility]) CM/SW Contact  Hicks Feick, Olegario Messier, RN Phone Number: 08/11/2020, 12:54 PM  Clinical Narrative:  1. 1.2 mi [facility], [facility] [address] [place], [place] [address] [phone] Overall rating Below average 2. 1.4 mi [facility] at the [facility] [address] [place], [place] [address] [phone] Overall rating Below average 3. 2 mi [facility] [address] [place], [place] [address] [phone] Overall rating Much above average 4. 2.3 mi [facility] [address] [place], [place] [address] [phone] Overall rating Below average 5. 2.6 mi [facility] [address] [place], [place] [address] [phone] Overall rating Below average 6. 3.1 mi [address] at [place], [place] [address] [place], [place] [address] [phone] Overall rating Much below average 7. 3.2 mi [facility] [address] [place], [place] [address] [phone] Overall rating Much above average 8. 3.6 mi [facility] [address] [place], [place] [address] [phone] Overall rating Below average 9. 3.7 mi [facility] [address] [place], [place] [address] [phone] Overall rating Average 10. 5.4 mi [facility] [address] [place], [place] [address] [phone] Overall rating Below average 11. 5.6 mi [facility] [employer] [address] [place], [place] [address] [phone] Overall rating Much above average 12. 6.1 mi [facility] [address] [place], [place] [address] [phone] Overall rating Much  above average 13. 7.1 mi [facility] [address] [place], [place] [address] [phone] Overall rating Much below average 14. 7.2 mi [facility] [address] [place], [place] [address] [phone] Overall rating Average 15. 7.9 [facility] [address] [place], [place] [address] [phone] Overall rating Above average 16. 10.6 mi [facility] [address] [place], [place] [address] [phone] Overall rating Below average 17. 11 mi [facility] [address] [place], [place] [address] [phone] Overall rating Much above average 18. 12.5 mi [place] [facility] [address] [place], [place] [address] [phone] Overall rating Much above average 19. 14.3 mi [facility] [address] [place], [place] [address] [phone] Overall rating Much below average 20. 14.5 [facility] [address] [place], [place] [address] [phone] Overall rating Much below average 21. 15 mi [facility] [address] [place], [place] [address] [phone] Overall rating Much above average 22. 15.1 mi [facility] [address] [place], [place] [address] [phone] Overall rating Much below average 23. 15.2 mi [facility] [address] [place], [place] [address] [phone] Overall rating Much above average 24. 15.6 mi [facility] [address] [place], [place] [address] [phone] Overall rating Much below average 25. 16.1 mi [employer] [facility] [address] [place], [place] [address] [phone] Overall rating Below average 26. 16.2 mi [facility] [place] [address] [place], [place] [address] [phone] Overall rating Average 27. 16.2 mi [facility] [phone]  [address] [place], [place] [address] [phone] Overall rating Average 28. 17 mi [facility] [address] [place], [place] [address] [phone] Overall rating Below average 29. 18.4 mi [address] at [employer] at [facility], [place] [address] [phone] Overall rating Much above average 30. 19.8 [facility] [address] [place], [place] [address] [phone] Overall rating Much below average 31. 20.4 [facility] [address] [place], [place] [address] [phone] Overall rating Below average 32. 20.6 [facility] [address] [place], [place] [address] [phone] Overall rating Much above average 33. 20.8 mi [facility] and [facility] [address] [place], [place] [address] [phone] Overall rating Average 34. 21 mi [facility] [address] [place], [place] [address] [phone] Overall rating Below average 35. 21.5 mi [facility] and [facility] [address] [place], [place] [address] [phone] Overall rating Below average 36. 21.7 mi [facility] [address] [place], [place] [address] [phone] Overall rating Above average 37. 21.9 [facility] [address] [place], [place] [address] [phone] Overall rating Below average 38. 23.2 mi [facility] [address] [place], [place] [address] [phone] Overall rating Much below average 39. 23.4 mi [address] [place], [place] [address] [phone] Overall rating Below average 40. 23.7 mi [employer] [address] [place], [place] [address] [phone] Overall rating Much above average 41. 24.4 [facility] [address] [place], [place] [address] [phone] Overall rating Much below average 42. 24.8 mi [facility] [address] [place], [place] [address] [phone] Overall rating          Expected Discharge Plan and Services                                                 Social Determinants of Health (SDOH) Interventions    Readmission Risk Interventions No flowsheet data found.

## 2020-08-11 NOTE — Progress Notes (Signed)
Speech Language Pathology Treatment: Dysphagia  Patient Details Name: Marcus Downs. MRN: [id number] DOB: 02/04/1941 Today's Date: 08/11/2020 Time: 1660-6301 SLP Time Calculation (min) (ACUTE ONLY): 45 min  Assessment / Plan / Recommendation Clinical Impression  Pt's son present in room today with his dad, pt able to identify pt from voice but didnot turn to look at his son despite verbal cues to conduct x3.  PO trials of advanced textures completed with pt to determine readiness for dietary advancement with dentures in place.  Prolonged mastication with oral holding noted without pt awareness. .Pt needs total cues to cease eating/drinking and try to swallow boluses retained in oral cavity.  Anterior loss of liquids right noted as pt opens mouth to take more liquids.  He demonstrates very poor awareness to his occurence.  Oral pocketing of food on the left noted which required SLP to clear - presumed due to prior CVA.  SLP provided pt with frequent wiping of liquids from lips and provided liquids to help transit partially masticated solids.    He required nearly 30 minutes to consume 2 oreo cookies, despite using liquids and ice cream to transit. Pt states to this SLP, "If you would not have been in my ear talking, I would've been ok."  Son present and he informed pt of  possibly choking without SLP present to assure adequate clearance - demonstrating good understanding of father's dysphagia.    Delayed = multiple swallow noted with liquids but no indication of airway compromise.  Liquids will be most efficient for pt to consume and he consumes Glucerna at home *strawberry*.    It is laborious for him to eat and thus maximize liquid nutrition.  Pt requires oral suctioning frequently during intake - and can suction himself if turned on and placed in his hand.   Having pt feed himself as much as able improved efficiency also with max cues thus advise to continue at least with liquids.    HPI  HPI: 79 yo male adm to [facility] after fall having broken his hip.   His is s/p left hip replacement.  PMH + for CVA 1997, HLD, HTN, right parietal CVA, prior smoker, C5-C6, C6-C7 cervical osteophytes - C5-C7 spondylosis.  He had issues with tachycardia over night and CCS consulted.  Swallow evaluation ordered.  Pt was seen earlier and given dentures located in room and SLP able to obtain denture adhesive from the SLP office, follow up visit indicated to determine readiness for dietary advancement.  Pt agreeable to plan.      SLP Plan  Continue with current plan of care       Recommendations  Diet recommendations: Dysphagia 1 (puree);Thin liquid (liquids most efficient) Liquids provided via: Cup;Straw Medication Administration: Whole meds with puree (with ice cream) Supervision: Full supervision/cueing for compensatory strategies Compensations: Slow rate;Small sips/bites;Other (Comment);Lingual sweep for clearance of pocketing (follow solids with puree, follow puree with liquids, oral suction before, during and after po) Postural Changes and/or Swallow Maneuvers: Seated upright 90 degrees;Upright 30-60 min after meal                Oral Care Recommendations: Oral care QID (oral suction after po) Follow up Recommendations: Skilled Nursing facility SLP Visit Diagnosis: Dysphagia, oral phase (R13.11);Other (comment) (? pharyngeal involvement due to cervical spondylosis) Plan: Continue with current plan of care       GO                Chales Abrahams  08/11/2020, 8:22 PM  Rolena Infante, MS [facility] SLP Acute Rehab Services Office [phone]

## 2020-08-11 NOTE — Progress Notes (Signed)
Subjective: 4 Days Post-Op Procedure(s) (LRB): ARTHROPLASTY BIPOLAR HIP (HEMIARTHROPLASTY) (Left)  Mr. Wiacek was seen today.  He remains bedridden secondary to persistent left side hemiparesis as a result of a previous CVA. He reports his hip pain today is mild.  Objective: Vital signs in last 24 hours: Temp:  [98 F (36.7 C)-98.2 F (36.8 C)] 98.2 F (36.8 C) (09/24 1225) Pulse Rate:  [92-105] 92 (09/24 1225) Resp:  [20-28] 28 (09/24 1225) BP: (123-155)/(62-77) 155/77 (09/24 1225) SpO2:  [96 %-100 %] 99 % (09/24 1225)  Intake/Output from previous day: 09/23 0701 - 09/24 0700 In: 1416.4 [P.O.:1260; I.V.:156.4] Out: 1650 [Urine:1650] Intake/Output this shift: No intake/output data recorded.  Recent Labs    08/09/20 0246 08/10/20 0452 08/11/20 0553  HGB 12.4* 12.3* 11.9*   Recent Labs    08/10/20 0452 08/11/20 0553  WBC 8.8 10.4  RBC 4.81 4.65  HCT 36.1* 34.3*  PLT 156 165   Recent Labs    08/10/20 0452 08/11/20 0553  NA 149* 146*  K 3.4* 3.7  CL 110 108  CO2 27 26  BUN 26* 25*  CREATININE 1.40* 1.33*  GLUCOSE 60* 171*  CALCIUM 8.0* 7.7*   No results for input(s): LABPT, INR in the last 72 hours.  Some curling of the edge of his Aquacel bandage was noted but it remains intact. Incision: dressing C/D/I Compartment soft   Assessment/Plan: 4 Days Post-Op Procedure(s) (LRB): ARTHROPLASTY BIPOLAR HIP (HEMIARTHROPLASTY) (Left) Advance diet Up with therapy Discharge to SNF.  From the perspective of his hemiarthroplasty, Mr. Yohn is ready for transfer to a SNF.  I believe we are waiting on a bed assignment at this point.  Patient continue with physical therapy as best he can although his persistent hemiparesis makes this difficult.  Anticipated LOS equal to or greater than 2 midnights due to - Age 79 and older with one or more of the following:  - Obesity  - Expected need for hospital services (PT, OT, Nursing) required for safe  discharge  - Anticipated  need for postoperative skilled nursing care or inpatient rehab  - Active co-morbidities: Persistent left-sided hemiparesis secondary to remote CVA   - Patient is a high risk of re-admission due to: Barriers to post-acute care (logistical, no family support in home) and Comorbid persistent left-sided hemiparesis   Shanon Payor 08/11/2020, 8:19 PM

## 2020-08-11 NOTE — Care Management Important Message (Signed)
Important Message  Patient Details IM Letter given to the Patient Name: Marcus Downs. MRN: [id number] Date of Birth: 05/15/1941   Medicare Important Message Given:  Yes     Caren Macadam 08/11/2020, 11:14 AM

## 2020-08-11 NOTE — TOC Progression Note (Addendum)
Transition of Care (TOC) - Progression Note    Patient Details  Name: Marcus Downs. MRN: [id number] Date of Birth: 1941-03-11  Transition of Care [facility]) CM/SW Contact  Marcus Downs, Marcus Messier, RN Phone Number: 08/11/2020, 1:30 PM  Clinical Narrative:  Bed offers given-await choice fro son Marcus Downs alos left list in rm for son Marcus Downs to review w/patient.   43/47p-Son Marcus Downs chose [employer] [facility] w/[facility] rep await call back for confirmation.         Expected Discharge Plan and Services                                                 Social Determinants of Health (SDOH) Interventions    Readmission Risk Interventions No flowsheet data found.

## 2020-08-11 NOTE — Progress Notes (Signed)
PROGRESS NOTE    Marcus Downs.  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 Unclear source of fever a. 101 fever 9/22 blood culture no growth to date and no recurrence of fevers--chest x-ray 9/22 - for signs of pneumonia-no further work-up b. Keep on dysphagia diet 1 as per speech therapy 2. Accidental mechanical fall left hip and shoulder fracture a. Mobilize as tolerated with therapy will need skilled placement-not ready for discharge b. Continue sling to left arm c. WBAT to left lower extremity TED hose in addition d. Pain control oxycodone 5-10 every 4 as needed moderate and higher dosing for severe pain e. Discontinue Dilaudid f. DVT prophylaxis aspirin 325 twice daily 3. Sinus tachycardia found to be sinus tach with PAT has been started on amiodarone a. Amiodarone drip used earlier in admission discontinued in favor of metoprolol 25 twice daily-Cardiology saw the patient and signed off which has not been resumed 9/23 b. At home is on Cardizem 360 XR 4. Early DKA with diabetes mellitus which is brittle and has had episodic hypoglycemia a. Patient is not eating so we cut back 70/30 insulin to 60 units twice daily 9/24, I have  changed him to sensitive sliding scale as he was on resistant earlier b. Probably will minimize on discharge Jardiance 10 daily Metformin 500 XR daily 5. Hypernatremia, hypokalemia as well as AKI 6. Probably obstructive uropathy causing azotemia a. Potassium stabilized b. Foley catheter needed to be placed on 9/23 as he was retaining over 200 cc of urine c. Creatinine has improved since then d. We will do a voiding trial in the morning however if he does not sense the sensation of pressure we will have to keep Foley on discharge 7. HTN  8. HLD a. Holding Caduet at this time can resume on discharge b. At this time hold HCTZ 25 and Cozaar 100 9. BPH 10. CVA 1997  a. severe deficits on the left side and will more likely than not need skilled care 11. Prior smoker 12. Iron deficiency anemia a. Iron twice a day   DVT prophylaxis: Aspirin, TED hose Code Status:  Family Communication: Called [phone] son [name]  I called him again 9.24 and updated hime Disposition:   Status is: Inpatient  Remains inpatient appropriate because:Hemodynamically unstable, Persistent severe electrolyte disturbances, Unsafe d/c plan and IV treatments appropriate due to intensity of illness or inability to take PO   Dispo:  Patient From: Home  Planned Disposition: Skilled Nursing Facility  Expected discharge date: 9/27  Medically stable for discharge: No        Consultants:   Orthopedics  Procedures: Hip repair by Dr. [name] 08/07/2020  Antimicrobials: None currently   Subjective:  Not interactive as much Not really moving much as is in pain and is not eating at  all according to nurse tech No reported stool  Objective: Vitals:   08/10/20 2121 08/10/20 2123 08/11/20 0545 08/11/20 0805  BP: 123/62 123/62 132/70 140/68  Pulse: (!) 105 (!) 105 94 (!) 101  Resp:  20 20 (!) 26  Temp:  98 F (36.7 C) 98 F (36.7 C)   TempSrc:      SpO2: 100% 99% 100% 96%  Weight:      Height:         Intake/Output Summary (Last 24 hours) at 08/11/2020 1030 Last data filed at 08/11/2020 0545 Gross per 24 hour  Intake 1042.5 ml  Output 1650 ml  Net -607.5 ml   Filed Weights   08/06/20 1636 08/07/20 1415 08/07/20 1907  Weight: 87.5 kg 87.5 kg 78 kg    Examination: No overt change from prior   General exam: EOMI NCAT slurred speech mouth twisting to the left side  Respiratory system: No added sound poor exam as refusing to set up Cardiovascular system: S1-S2 no murmur rub or gallop-on monitors his 100 with PVC Gastrointestinal system: Soft nontender no HSM Central nervous system: Overall weak on left side and limited because of sling and fracture Extremities: ROM intact to right side Skin: No lower extremity edema Psychiatry: Euthymic and congruent  Data Reviewed: I have personally reviewed following labs and imaging studies   Sodium 146 potassium 3.7 BUNs/creatinine down to 18/1.2-->26/1.4--> 25/1.3 White count 10.4 hemoglobin 11.9, platelet 165  Radiology Studies: DG CHEST PORT 1 VIEW  Result Date: 08/09/2020 CLINICAL DATA:  Hyperglycemia EXAM: PORTABLE CHEST 1 VIE COMPARISON:  August 06, 2020 FINDINGS: Lungs are clear. Heart size and pulmonary vascularity are normal. No adenopathy. No bone lesions. IMPRESSION: Lungs clear. Cardiac silhouette within normal limits. No adenopathy. Electronically Signed   By: Bretta Bang III M.D.   On: 08/09/2020 14:35     Scheduled Meds: . aspirin EC  325 mg Oral BID PC  . atorvastatin  40 mg Oral Daily  . Chlorhexidine Gluconate Cloth  6 each Topical Daily  . ferrous sulfate  325 mg Oral BID WC  . insulin aspart  0-9 Units Subcutaneous TID WC  . insulin aspart protamine- aspart  6 Units Subcutaneous BID WC  . latanoprost  1 drop Both Eyes QHS  . mouth rinse  15 mL Mouth Rinse BID  . metoprolol tartrate  25 mg Oral BID  . nystatin  5 mL Oral QID  . polyethylene glycol  17 g Oral Daily  . potassium chloride  20 mEq Oral  Daily   Continuous Infusions:    LOS: 5 days   Time spent: 30  Rhetta Mura, MD [facility] To contact the attending provider between 7A-7P or the covering provider during after hours 7P-7A, please log into the web site [email or web address] and access using universal [place] password for that web site. If you do not have the password, please call the hospital operator.  08/11/2020, 10:30 AM

## 2020-08-12 LAB — CULTURE, BLOOD (ROUTINE X 2)
Culture: NO GROWTH
Culture: NO GROWTH
Special Requests: ADEQUATE

## 2020-08-12 LAB — COMPREHENSIVE METABOLIC PANEL
ALT: 54 U/L — ABNORMAL HIGH (ref 0–44)
AST: 109 U/L — ABNORMAL HIGH (ref 15–41)
Albumin: 2.1 g/dL — ABNORMAL LOW (ref 3.5–5.0)
Alkaline Phosphatase: 146 U/L — ABNORMAL HIGH (ref 38–126)
Anion gap: 12 (ref 5–15)
BUN: 25 mg/dL — ABNORMAL HIGH (ref 8–23)
CO2: 26 mmol/L (ref 22–32)
Calcium: 7.9 mg/dL — ABNORMAL LOW (ref 8.9–10.3)
Chloride: 108 mmol/L (ref 98–111)
Creatinine, Ser: 1.34 mg/dL — ABNORMAL HIGH (ref 0.61–1.24)
GFR calc Af Amer: 58 mL/min — ABNORMAL LOW (ref >60)
GFR calc non Af Amer: 50 mL/min — ABNORMAL LOW (ref >60)
Glucose, Bld: 221 mg/dL — ABNORMAL HIGH (ref 70–99)
Potassium: 3.8 mmol/L (ref 3.5–5.1)
Sodium: 146 mmol/L — ABNORMAL HIGH (ref 135–145)
Total Bilirubin: 1.1 mg/dL (ref 0.3–1.2)
Total Protein: 6.4 g/dL — ABNORMAL LOW (ref 6.5–8.1)

## 2020-08-12 LAB — CBC WITH DIFFERENTIAL/PLATELET
Abs Immature Granulocytes: 0.07 10*3/uL (ref 0.00–0.07)
Basophils Absolute: 0 10*3/uL (ref 0.0–0.1)
Basophils Relative: 0 %
Eosinophils Absolute: 0.5 10*3/uL (ref 0.0–0.5)
Eosinophils Relative: 5 %
HCT: 33.1 % — ABNORMAL LOW (ref 39.0–52.0)
Hemoglobin: 11.3 g/dL — ABNORMAL LOW (ref 13.0–17.0)
Immature Granulocytes: 1 %
Lymphocytes Relative: 15 %
Lymphs Abs: 1.5 10*3/uL (ref 0.7–4.0)
MCH: 25.3 pg — ABNORMAL LOW (ref 26.0–34.0)
MCHC: 34.1 g/dL (ref 30.0–36.0)
MCV: 74 fL — ABNORMAL LOW (ref 80.0–100.0)
Monocytes Absolute: 1 10*3/uL (ref 0.1–1.0)
Monocytes Relative: 10 %
Neutro Abs: 7 10*3/uL (ref 1.7–7.7)
Neutrophils Relative %: 69 %
Platelets: 193 10*3/uL (ref 150–400)
RBC: 4.47 MIL/uL (ref 4.22–5.81)
RDW: 19.2 % — ABNORMAL HIGH (ref 11.5–15.5)
WBC: 10.1 10*3/uL (ref 4.0–10.5)
nRBC: 0 % (ref 0.0–0.2)

## 2020-08-12 LAB — GLUCOSE, CAPILLARY
Glucose-Capillary: 213 mg/dL — ABNORMAL HIGH (ref 70–99)
Glucose-Capillary: 189 mg/dL — ABNORMAL HIGH (ref 70–99)
Glucose-Capillary: 220 mg/dL — ABNORMAL HIGH (ref 70–99)
Glucose-Capillary: 184 mg/dL — ABNORMAL HIGH (ref 70–99)

## 2020-08-12 NOTE — Progress Notes (Signed)
PROGRESS NOTE    Denton Lank.  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 Unclear source of fever a. 101 fever 9/22 blood culture no growth to date-has some low-grade temps of 99 -chest x-ray 9/22 - for signs of pneumonia-no further work-up b. Speech therapy reevaluated on 9/24 and patient seems to be at risk of choking c. He has a very high risk of aspiration going forward and is on a dysphagia 1 diet 2. Accidental mechanical fall left hip and shoulder fracture a. Mobilize as tolerated with therapy will need skilled placement b. Continue sling to left arm c. WBAT to left lower extremity TED hose in addition d. Oxycodone low-dose ordered with supplemental higher dose for more severe pain e. DVT prophylaxis aspirin 325 twice daily with TED hose 3. Sinus tachycardia found to be sinus tach with PAT has been started on amiodarone a. Amiodarone drip used earlier in admission discontinued in favor of metoprolol 25 twice daily-Cardiology saw the patient and signed off  b. Prior to admission Cardizem discontinued 4. Early DKA with diabetes mellitus which is brittle and has had episodic hypoglycemia a. 70/30 insulin at 6 units discontinue  sliding scale b. Discontinue on discharge Jardiance 10 daily Metformin 500 XR daily 5. Hypernatremia, hypokalemia as well as AKI 6. Probably obstructive uropathy causing azotemia a. Foley catheter needed to be placed on 9/23 as he was retaining over 200 cc of urine b. Creatinine as well as potassium c. Keep Foley on discharge unable to move around-mobilize well 7. Transaminitis a. Possibly secondary to not really eating-no further work-up at this stage b. We will discuss implications with family 8. HTN  9. HLD a. Holding Caduet at this time can resume on discharge b. At this time hold HCTZ 25 and Cozaar 100 10. BPH 11. CVA 1997  a. severe deficits on the left side and will more likely than not need skilled care 12. Prior smoker 13. Iron deficiency anemia a. Iron twice a day   DVT prophylaxis: Aspirin, TED hose Code Status:  Family Communication: Called [phone] on chaperone.  Interactive but does not wish to eat  No chest pain no fever Disposition: Patient will probably be able to discharge to skilled facility on 9/27  Status is: Inpatient  Remains inpatient appropriate because:Hemodynamically unstable, Persistent severe electrolyte disturbances, Unsafe d/c plan and IV treatments appropriate due to intensity of illness or inability to take PO   Dispo:  Patient From: Home  Planned Disposition: Skilled Nursing Facility  Expected discharge date: 9/27  Medically stable for discharge: No -I would strongly favor that palliative medicine follow him at the facility as he is quite frail and he may improve over the next several weeks at the facility but he may also have further decline   Consultants:   Orthopedics  Procedures: Hip repair by Dr. [name] 08/07/2020  Antimicrobials: None currently   Subjective:  Interactive but not really wanting to eat No further high-grade fevers We had a long talk at the bedside about whether he would want a feeding tube and he  tells me he would not  Objective: Vitals:   08/11/20 2250 08/12/20 0203 08/12/20 0614 08/12/20 0954  BP:  (!) 104/56 132/67 (!) 153/69  Pulse:  88 89 91  Resp:  20 20 (!) 27  Temp: 99.1 F (37.3 C) 98.2 F (36.8 C) 98.9 F (37.2 C) 99.3 F (37.4 C)  TempSrc: Oral Oral  Oral  SpO2:  95% 97% 95%  Weight:      Height:        Intake/Output Summary (Last 24 hours) at 08/12/2020 1315 Last data filed at 08/12/2020 0900 Gross per 24 hour  Intake 1080 ml  Output 2305 ml  Net -1225 ml   Filed Weights   08/06/20 1636 08/07/20 1415 08/07/20 1907  Weight: 87.5 kg 87.5 kg 78 kg    Examination:  Conversant mild twisting of the mouth to the left side EOMI NCAT Sling on the left arm quite weak unable to move it without pain Unwilling to move left lower extremity Chest clear no added sound no rales no rhonchi Not on telemetry Flat affect  Data Reviewed: I have personally reviewed following labs and imaging studies   Sodium 146 potassium 3.7-->3.8 BUNs/creatinine down to 18/1.2-->26/1.4--> 25/1.3-->25/1.3 White count 10.4-->10.1 Hemoglobin 11.9->11.3 , platelet 193 LFTs slightly 109/53 bilirubin 1.1  Radiology Studies: No results found.   Scheduled Meds: . aspirin EC  325 mg Oral BID PC  . atorvastatin  40 mg Oral Daily  . Chlorhexidine Gluconate Cloth  6 each Topical Daily  . ferrous sulfate  325 mg Oral BID WC  . insulin aspart  0-9 Units Subcutaneous TID WC  . insulin aspart protamine- aspart  6 Units Subcutaneous BID WC  . latanoprost  1 drop Both Eyes QHS  . mouth rinse  15 mL Mouth Rinse BID  . metoprolol tartrate  25 mg Oral BID  . nystatin  5 mL Oral QID  . polyethylene glycol  17 g Oral Daily  . potassium chloride  20 mEq Oral Daily   Continuous Infusions:    LOS: 6 days   Time spent: 30  Rhetta Mura, MD [facility] To contact the attending provider between 7A-7P or the covering provider during after hours 7P-7A, please log into the web  site [email or web address] and access using universal [place] password for that web site. If you do not have the password, please call the hospital operator.  08/12/2020, 1:15 PM

## 2020-08-13 LAB — CBC WITH DIFFERENTIAL/PLATELET
Abs Immature Granulocytes: 0.06 10*3/uL (ref 0.00–0.07)
Basophils Absolute: 0 10*3/uL (ref 0.0–0.1)
Basophils Relative: 0 %
Eosinophils Absolute: 0.6 10*3/uL — ABNORMAL HIGH (ref 0.0–0.5)
Eosinophils Relative: 5 %
HCT: 33.2 % — ABNORMAL LOW (ref 39.0–52.0)
Hemoglobin: 11.6 g/dL — ABNORMAL LOW (ref 13.0–17.0)
Immature Granulocytes: 1 %
Lymphocytes Relative: 12 %
Lymphs Abs: 1.3 10*3/uL (ref 0.7–4.0)
MCH: 25.7 pg — ABNORMAL LOW (ref 26.0–34.0)
MCHC: 34.9 g/dL (ref 30.0–36.0)
MCV: 73.5 fL — ABNORMAL LOW (ref 80.0–100.0)
Monocytes Absolute: 1 10*3/uL (ref 0.1–1.0)
Monocytes Relative: 9 %
Neutro Abs: 7.8 10*3/uL — ABNORMAL HIGH (ref 1.7–7.7)
Neutrophils Relative %: 73 %
Platelets: 230 10*3/uL (ref 150–400)
RBC: 4.52 MIL/uL (ref 4.22–5.81)
RDW: 19.3 % — ABNORMAL HIGH (ref 11.5–15.5)
WBC: 10.7 10*3/uL — ABNORMAL HIGH (ref 4.0–10.5)
nRBC: 0 % (ref 0.0–0.2)

## 2020-08-13 LAB — GLUCOSE, CAPILLARY
Glucose-Capillary: 169 mg/dL — ABNORMAL HIGH (ref 70–99)
Glucose-Capillary: 191 mg/dL — ABNORMAL HIGH (ref 70–99)
Glucose-Capillary: 127 mg/dL — ABNORMAL HIGH (ref 70–99)
Glucose-Capillary: 134 mg/dL — ABNORMAL HIGH (ref 70–99)

## 2020-08-13 LAB — BASIC METABOLIC PANEL
Anion gap: 10 (ref 5–15)
BUN: 24 mg/dL — ABNORMAL HIGH (ref 8–23)
CO2: 22 mmol/L (ref 22–32)
Calcium: 7.6 mg/dL — ABNORMAL LOW (ref 8.9–10.3)
Chloride: 110 mmol/L (ref 98–111)
Creatinine, Ser: 1.04 mg/dL (ref 0.61–1.24)
GFR calc Af Amer: >60 mL/min (ref >60)
GFR calc non Af Amer: >60 mL/min (ref >60)
Glucose, Bld: 161 mg/dL — ABNORMAL HIGH (ref 70–99)
Potassium: 3.9 mmol/L (ref 3.5–5.1)
Sodium: 142 mmol/L (ref 135–145)

## 2020-08-13 NOTE — Progress Notes (Signed)
PROGRESS NOTE    Denton Lank.  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 Unclear source of fever a. 101 fever 9/22 blood culture no growth to date-has some low-grade temps of 99 -chest x-ray 9/22 - for signs of pneumonia-no further work-up b. Speech therapy reevaluated on 9/24 and patient seems to be at risk of choking c.  high risk of aspiration-continue dysphagia 1 diet 2. Accidental mechanical fall left hip and shoulder fracture a. Mobilize as tolerated with therapy will need skilled placement b. Continue sling to left arm c. WBAT to left lower extremity TED hose in addition d. Oxycodone low-dose ordered with supplemental higher dose for more severe pain e. DVT prophylaxis aspirin 325 twice daily with TED hose 3. Sinus tachycardia found to be sinus tach with PAT has been started on amiodarone a. Amiodarone drip used earlier in admission discontinued in favor of metoprolol 25 twice daily-Cardiology saw the patient and signed off  b. Prior to admission Cardizem discontinued 4. Early DKA with diabetes mellitus which is brittle and has had episodic hypoglycemia a. 70/30 insulin at 6 units discontinue sliding scale, CBG  169-191 b. Discontinue on discharge Jardiance 10 daily Metformin 500 XR daily 5. Hypernatremia, hypokalemia as well as AKI 6. Probably obstructive uropathy causing azotemia a. Potassium stable--stop replacement b. Foley catheter needed to be placed on 9/23 as he was retaining over 200 cc of urine c. Improved Vol depletion, has been saline locked d. Keep Foley on discharge unable to move around-mobilize well 7. Transaminitis a. Possibly secondary to not really eating-no further work-up at this stage b. We will discuss implications with family 8. HTN  9. HLD a. Holding Caduet at this time can resume on discharge b. At this time hold HCTZ 25 and Cozaar 100 10. BPH 11. CVA 1997  a. severe deficits on the left side and will more likely than not need skilled care 12. Prior smoker 13. Iron deficiency anemia a. Iron twice a day   DVT prophylaxis: Aspirin, TED hose Code Status:  Family Communication: Called [phone] and updated his son fully--9/26 Disposition: Patient will probably be able to discharge to skilled facility on 9/27  Status is: Inpatient  Remains inpatient appropriate because:Hemodynamically unstable, Persistent severe electrolyte disturbances, Unsafe d/c plan and IV treatments appropriate due to intensity of illness or inability to take PO   Dispo:  Patient From: Home  Planned Disposition: Skilled Nursing Facility  Expected discharge date: 9/27  Medically stable for discharge: No   strongly favor that palliative medicine follow him at the facility as he is quite frail and he may improve over the next several weeks at the facility but he may also have further decline   Consultants:   Orthopedics  Procedures: Hip repair by Dr. [name] 08/07/2020  Antimicrobials: None currently  Subjective:  A little more interactive--eating minimally per RN however Tells me used to drink ensures/gluverna at home No overt pains anywhere   Objective: Vitals:    08/12/20 1354 08/12/20 1826 08/12/20 2114 08/13/20 0535  BP: (!) 148/80 (!) 141/76 129/66 135/69  Pulse: 91 90 92 92  Resp: (!) 29 (!) 23  18  Temp: 97.7 F (36.5 C) 98.9 F (37.2 C) 98 F (36.7 C) 98.8 F (37.1 C)  TempSrc: Oral Oral Oral   SpO2: 99% 100% 97% 100%  Weight:      Height:        Intake/Output Summary (Last 24 hours) at 08/13/2020 1234 Last data filed at 08/13/2020 1135 Gross per 24 hour  Intake 1440 ml  Output 2250 ml  Net -810 ml   Filed Weights   08/06/20 1636 08/07/20 1415 08/07/20 1907  Weight: 87.5 kg 87.5 kg 78 kg    Examination:  Conversant mild twisting of the mouth to the left side--no changes EOMI NCAT no ict Sling on the left arm quite weak unable to move it without pain Unwilling to move left lower extremity Chest clear no added sound no rales no rhonchi Not on telemetry Flat affect  Data Reviewed: I have personally reviewed following labs and imaging studies   Sodium 146 -->142 potassium 3.7-->3.9 BUNs/creatinine down to 18/1.2-->26/1.4--> 25/1.3-->25/1.3-->24/1.04 White count 10.4-->10.7 Hemoglobin 11.9->11.3 , platelet 230 LFTs slightly 109/53 bilirubin 1.1  Radiology Studies: No results found.   Scheduled Meds: . aspirin EC  325 mg Oral BID PC  . atorvastatin  40 mg Oral Daily  . Chlorhexidine Gluconate Cloth  6 each Topical Daily  . ferrous sulfate  325 mg Oral BID WC  . insulin aspart protamine- aspart  6 Units Subcutaneous BID WC  . latanoprost  1 drop Both Eyes QHS  . mouth rinse  15 mL Mouth Rinse BID  . metoprolol tartrate  25 mg Oral BID  . nystatin  5 mL Oral QID  . polyethylene glycol  17 g Oral Daily  . potassium chloride  20 mEq Oral Daily   Continuous Infusions:    LOS: 7 days   Time spent: 30  Rhetta Mura, MD [facility] To contact the attending provider between 7A-7P or the covering provider during after hours 7P-7A, please log into the web site [email or web address] and access using universal  [place] password for that web site. If you do not have the password, please call the hospital operator.  08/13/2020, 12:34 PM

## 2020-08-14 DIAGNOSIS — R5381 Other malaise: Secondary | ICD-10-CM | POA: Diagnosis not present

## 2020-08-14 DIAGNOSIS — I69891 Dysphagia following other cerebrovascular disease: Secondary | ICD-10-CM | POA: Diagnosis not present

## 2020-08-14 DIAGNOSIS — E119 Type 2 diabetes mellitus without complications: Secondary | ICD-10-CM | POA: Diagnosis not present

## 2020-08-14 DIAGNOSIS — R2681 Unsteadiness on feet: Secondary | ICD-10-CM | POA: Diagnosis not present

## 2020-08-14 DIAGNOSIS — G243 Spasmodic torticollis: Secondary | ICD-10-CM | POA: Diagnosis not present

## 2020-08-14 DIAGNOSIS — E1159 Type 2 diabetes mellitus with other circulatory complications: Secondary | ICD-10-CM | POA: Diagnosis not present

## 2020-08-14 DIAGNOSIS — I69391 Dysphagia following cerebral infarction: Secondary | ICD-10-CM | POA: Diagnosis not present

## 2020-08-14 DIAGNOSIS — S72002A Fracture of unspecified part of neck of left femur, initial encounter for closed fracture: Secondary | ICD-10-CM | POA: Diagnosis not present

## 2020-08-14 DIAGNOSIS — S72002D Fracture of unspecified part of neck of left femur, subsequent encounter for closed fracture with routine healing: Secondary | ICD-10-CM | POA: Diagnosis not present

## 2020-08-14 DIAGNOSIS — H401190 Primary open-angle glaucoma, unspecified eye, stage unspecified: Secondary | ICD-10-CM | POA: Diagnosis not present

## 2020-08-14 DIAGNOSIS — E1110 Type 2 diabetes mellitus with ketoacidosis without coma: Secondary | ICD-10-CM | POA: Diagnosis not present

## 2020-08-14 DIAGNOSIS — Z978 Presence of other specified devices: Secondary | ICD-10-CM | POA: Diagnosis not present

## 2020-08-14 DIAGNOSIS — M2550 Pain in unspecified joint: Secondary | ICD-10-CM | POA: Diagnosis not present

## 2020-08-14 DIAGNOSIS — N139 Obstructive and reflux uropathy, unspecified: Secondary | ICD-10-CM | POA: Diagnosis not present

## 2020-08-14 DIAGNOSIS — I69828 Other speech and language deficits following other cerebrovascular disease: Secondary | ICD-10-CM | POA: Diagnosis not present

## 2020-08-14 DIAGNOSIS — I471 Supraventricular tachycardia: Secondary | ICD-10-CM | POA: Diagnosis not present

## 2020-08-14 DIAGNOSIS — I4510 Unspecified right bundle-branch block: Secondary | ICD-10-CM | POA: Diagnosis not present

## 2020-08-14 DIAGNOSIS — M6281 Muscle weakness (generalized): Secondary | ICD-10-CM | POA: Diagnosis not present

## 2020-08-14 DIAGNOSIS — E1165 Type 2 diabetes mellitus with hyperglycemia: Secondary | ICD-10-CM | POA: Diagnosis not present

## 2020-08-14 DIAGNOSIS — Z96649 Presence of unspecified artificial hip joint: Secondary | ICD-10-CM | POA: Diagnosis not present

## 2020-08-14 DIAGNOSIS — Z794 Long term (current) use of insulin: Secondary | ICD-10-CM | POA: Diagnosis not present

## 2020-08-14 DIAGNOSIS — I69354 Hemiplegia and hemiparesis following cerebral infarction affecting left non-dominant side: Secondary | ICD-10-CM | POA: Diagnosis not present

## 2020-08-14 DIAGNOSIS — S42295A Other nondisplaced fracture of upper end of left humerus, initial encounter for closed fracture: Secondary | ICD-10-CM | POA: Diagnosis not present

## 2020-08-14 DIAGNOSIS — Z7401 Bed confinement status: Secondary | ICD-10-CM | POA: Diagnosis not present

## 2020-08-14 DIAGNOSIS — I10 Essential (primary) hypertension: Secondary | ICD-10-CM | POA: Diagnosis not present

## 2020-08-14 DIAGNOSIS — R7401 Elevation of levels of liver transaminase levels: Secondary | ICD-10-CM | POA: Diagnosis not present

## 2020-08-14 DIAGNOSIS — M21372 Foot drop, left foot: Secondary | ICD-10-CM | POA: Diagnosis not present

## 2020-08-14 DIAGNOSIS — Z96642 Presence of left artificial hip joint: Secondary | ICD-10-CM | POA: Diagnosis not present

## 2020-08-14 DIAGNOSIS — Z9181 History of falling: Secondary | ICD-10-CM | POA: Diagnosis not present

## 2020-08-14 DIAGNOSIS — R339 Retention of urine, unspecified: Secondary | ICD-10-CM | POA: Diagnosis not present

## 2020-08-14 DIAGNOSIS — R1312 Dysphagia, oropharyngeal phase: Secondary | ICD-10-CM | POA: Diagnosis not present

## 2020-08-14 DIAGNOSIS — E785 Hyperlipidemia, unspecified: Secondary | ICD-10-CM | POA: Diagnosis not present

## 2020-08-14 LAB — CULTURE, BLOOD (ROUTINE X 2)
Culture: NO GROWTH
Culture: NO GROWTH
Special Requests: ADEQUATE

## 2020-08-14 LAB — GLUCOSE, CAPILLARY
Glucose-Capillary: 148 mg/dL — ABNORMAL HIGH (ref 70–99)
Glucose-Capillary: 183 mg/dL — ABNORMAL HIGH (ref 70–99)
Glucose-Capillary: 180 mg/dL — ABNORMAL HIGH (ref 70–99)
Glucose-Capillary: 164 mg/dL — ABNORMAL HIGH (ref 70–99)

## 2020-08-14 LAB — SARS CORONAVIRUS 2 (TAT 6-24 HRS): SARS Coronavirus 2: NEGATIVE

## 2020-08-14 MED ORDER — INSULIN ASPART PROT & ASPART (70-30 MIX) 100 UNIT/ML ~~LOC~~ SUSP: 8.0000 [IU] | Freq: Two times a day (BID) | SUBCUTANEOUS | 11 refills | Status: DC

## 2020-08-14 MED ORDER — METHOCARBAMOL 500 MG PO TABS: 500.0000 mg | ORAL_TABLET | Freq: Four times a day (QID) | ORAL | Status: DC | PRN

## 2020-08-14 MED ORDER — ATORVASTATIN CALCIUM 40 MG PO TABS: 40.0000 mg | ORAL_TABLET | Freq: Every day | ORAL | Status: AC

## 2020-08-14 MED ORDER — ACETAMINOPHEN 325 MG PO TABS: 325.0000 mg | ORAL_TABLET | Freq: Four times a day (QID) | ORAL | Status: DC | PRN

## 2020-08-14 MED ORDER — OXYCODONE HCL 5 MG PO TABS
5.0000 mg | ORAL_TABLET | ORAL | 0 refills | Status: DC | PRN
Start: 2020-08-14 — End: 2020-08-16

## 2020-08-14 MED ORDER — ASPIRIN 325 MG PO TBEC
325.0000 mg | DELAYED_RELEASE_TABLET | Freq: Two times a day (BID) | ORAL | 0 refills | Status: DC
Start: 2020-08-14 — End: 2020-09-21

## 2020-08-14 MED ORDER — OXYCODONE HCL 10 MG PO TABS: 10.0000 mg | ORAL_TABLET | ORAL | 0 refills | Status: DC | PRN

## 2020-08-14 MED ORDER — METOPROLOL TARTRATE 25 MG PO TABS: 25.0000 mg | ORAL_TABLET | Freq: Two times a day (BID) | ORAL | 0 refills | Status: AC

## 2020-08-14 NOTE — Progress Notes (Signed)
Report called to [facility]. Naphtali Riede Johnson, RN 

## 2020-08-14 NOTE — Progress Notes (Signed)
[facility] transporting patient to [facility]. Notified Cassandra (sister) on  contact list.

## 2020-08-14 NOTE — Progress Notes (Signed)
Physical Therapy Treatment Patient Details Name: Marcus Downs. MRN: [id number] DOB: May 09, 1941 Today's Date: 08/14/2020    History of Present Illness 79 year old with diabetes, hypertension, hyperlipidemia, CVA presenting with fall, L humerus and hip fracture. s/p L hip hemiarthroplasty 08/07/20. Noted to be significantly hypertensive with tachycardia and AKI secondary to dehydration and DKA    PT Comments    Pt progressing slowly. Continues to be easiiy agitated however redirects with cues. Question if pt is a "pusher" at baseline as he has significant difficulty finding and is averse to sitting in  midline--PLOF still unclear, no family present during PT session. Continue to recommend SNF post acute   Follow Up Recommendations  SNF     Equipment Recommendations  None recommended by PT    Recommendations for Other Services       Precautions / Restrictions Precautions Precautions: Posterior Hip;Fall Restrictions Weight Bearing Restrictions: Yes LLE Weight Bearing: Partial weight bearing LLE Partial Weight Bearing Percentage or Pounds: not specified    Mobility  Bed Mobility   Bed Mobility: Supine to Sit;Sit to Supine     Supine to sit: +2 for physical assistance;+2 for safety/equipment;Max assist Sit to supine: Total assist;+2 for physical assistance;+2 for safety/equipment   General bed mobility comments: assist with trunk and bil LEs in both directions, initiates movement with RLE   Transfers                    Ambulation/Gait                 Stairs             Wheelchair Mobility    Modified Rankin (Stroke Patients Only)       Balance   Sitting-balance support: Single extremity supported Sitting balance-Leahy Scale: Poor Sitting balance - Comments: LOB to R side, possibly incr shifting to R d/t L hip pain. ? if pt was a "pusher" at baseline; requires +2 assist and multi-modal cues to gaet nera midline                                     Cognition Arousal/Alertness: Awake/alert Behavior During Therapy:  (easily agitiated) Overall Cognitive Status: No family/caregiver present to determine baseline cognitive functioning                                 General Comments: Oriented to self, hospital and situation. PLOF informations continues to be limited.      Exercises      General Comments        Pertinent Vitals/Pain Pain Assessment: Faces Faces Pain Scale: No hurt Pain Location: denies but grimaces with movement  Pain Descriptors / Indicators: Grimacing;Guarding;Moaning Pain Intervention(s): Limited activity within patient's tolerance;Monitored during session;Repositioned    Home Living                      Prior Function            PT Goals (current goals can now be found in the care plan section) Acute Rehab PT Goals Patient Stated Goal: decrease pain PT Goal Formulation: Patient unable to participate in goal setting Time For Goal Achievement: 08/22/20 Potential to Achieve Goals: Fair Progress towards PT goals: Progressing toward goals    Frequency    Min 2X/week  PT Plan Current plan remains appropriate    Co-evaluation              AM-PAC PT "6 Clicks" Mobility   Outcome Measure  Help needed turning from your back to your side while in a flat bed without using bedrails?: Total Help needed moving from lying on your back to sitting on the side of a flat bed without using bedrails?: Total Help needed moving to and from a bed to a chair (including a wheelchair)?: Total Help needed standing up from a chair using your arms (e.g., wheelchair or bedside chair)?: Total Help needed to walk in hospital room?: Total Help needed climbing 3-5 steps with a railing? : Total 6 Click Score: 6    End of Session   Activity Tolerance: Patient limited by fatigue Patient left: in bed;with call bell/phone within reach;with bed alarm set Nurse  Communication: Mobility status PT Visit Diagnosis: Difficulty in walking, not elsewhere classified (R26.2);Pain;Muscle weakness (generalized) (M62.81);History of falling (Z91.81) Pain - Right/Left: Left Pain - part of body: Hip     Time: 8937-3428 PT Time Calculation (min) (ACUTE ONLY): 23 min  Charges:  $Therapeutic Activity: 23-37 mins                     Delice Bison, PT  Acute Rehab Dept ([facility]) [phone] Pager [phone]  08/14/2020    [facility] 08/14/2020, 3:02 PM

## 2020-08-14 NOTE — Progress Notes (Signed)
Patient is waiting for transport to go to [facility]. Patient is resting comfortably in the bed and is stable and alert. Patient refused insulin, but was given education about the importance of the insulin from two RN's. Patient may need reminding about the need for insulin. Patient is caught up on medication for this shift until transport comes to pick up patient.

## 2020-08-14 NOTE — Care Management Important Message (Signed)
Important Message  Patient Details IM Letter given to the Patient Name: Marcus Downs. MRN: [id number] Date of Birth: 1941/02/14   Medicare Important Message Given:  Yes     Caren Macadam 08/14/2020, 12:23 PM

## 2020-08-14 NOTE — TOC Progression Note (Addendum)
Transition of Care (TOC) - Progression Note    Patient Details  Name: Marcus Downs. MRN: [id number] Date of Birth: October 24, 1941  Transition of Care [facility]) CM/SW Contact  Magdalene Tardiff, Olegario Messier, RN Phone Number: 08/14/2020, 9:29 AM  Clinical Narrative: Received message from son James-now wanting to change SNF from [facility] to [facility] await call back from [facility] if able to accept-informed [name] that d/c has already been placed.    11:23a-Patient's son changed SNF facility choice [facility] will check on a bed,but it may be later today. D/c summary sent.       Expected Discharge Plan and Services           Expected Discharge Date: 08/14/20                                     Social Determinants of Health (SDOH) Interventions    Readmission Risk Interventions No flowsheet data found.

## 2020-08-14 NOTE — Progress Notes (Signed)
  Speech Language Pathology Treatment: Dysphagia  Patient Details Name: Marcus Downs. MRN: [id number] DOB: 06-01-1941 Today's Date: 08/14/2020 Time: 7408-1448 SLP Time Calculation (min) (ACUTE ONLY): 35 min  Assessment / Plan / Recommendation Clinical Impression  SLP visit today to assuer p tolerating po intake and assess for readiness for dietary advancement.  He is fully alert in bed, holding his dentures in his his hand.  SLP took dentures and brushed them - after which brushed pt's gums and placed dentures with adhesive on them.  Dentures required SLP to apply pressure to roof of mouth to help seal dentures due to illfitting.  After 3rd trial, seal as accomplished.  Pt's meal tray at bedside that is full of whole foods.  Only provided po trials of magic cup, pureed peaches, and water.  Oral holding noted with pt immediately stating "I need some water" indicating need to transit foods.  He did accept water intake via straw with mild backflow into straw.  Disoordinated audible swallows (x2 with each liquid bolus)  appeared present but no indications of aspiration.  Upon opening mouth, pt with labial spillage of liquids from right labia = leaning right.  There has not been marked improvement with his swallow function since Friday 9/24 and he will need full supervision as well as ORAL SUCTION set up to use before, during and after po.  Concern for adequacy of nutritional and hydration intake present and continue to recommend to maximize liquid nutrition *glucernas?.  Please order follow up SlP at next venue of care for dysphagia management as pt states this dysphagia is new.    HPI HPI: 79 yo male adm to [facility] after fall having broken his hip.   His is s/p left hip replacement.  PMH + for CVA 1997, HLD, HTN, right parietal CVA, prior smoker, C5-C6, C6-C7 cervical osteophytes - C5-C7 spondylosis.  He had issues with tachycardia over night and [facility] consulted.  Swallow evaluation ordered.  Pt was seen  earlier and given dentures located in room and SLP able to obtain denture adhesive from the SLP office, follow up visit indicated to determine readiness for dietary advancement.  Pt agreeable to plan.      SLP Plan  Continue with current plan of care       Recommendations  Diet recommendations: Dysphagia 1 (puree);Thin liquid Liquids provided via: Cup;Straw Medication Administration: Whole meds with puree (with ice cream) Supervision: Full supervision/cueing for compensatory strategies Compensations: Slow rate;Small sips/bites;Other (Comment);Lingual sweep for clearance of pocketing (follow solids with puree, follow puree with liquids, oral suction before, during and after po) Postural Changes and/or Swallow Maneuvers: Seated upright 90 degrees;Upright 30-60 min after meal                Oral Care Recommendations: Oral care QID (oral suction after po) Follow up Recommendations: Skilled Nursing facility SLP Visit Diagnosis: Dysphagia, oral phase (R13.11);Other (comment) (? 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

## 2020-08-14 NOTE — Discharge Summary (Signed)
Physician Discharge Summary  Marcus Downs. ZDG:[id number] DOB: January 17, 1941 DOA: 08/06/2020  PCP: [name], MD  Admit date: 08/06/2020 Discharge date: 08/14/2020  Time spent: 35 minutes  Recommendations for Outpatient Follow-up:  1. Needs Chem-12, CBC and differential in about 1 week 2. Recommend comprehensive rehab with skilled care PT OT 3. Need follow-up with Dr. Luiz Blare in the outpatient setting and discussion about addition may be of baclofen for spasms to his neck which I feel is compensator 4. Requires TED hose as well as aspirin twice daily at least for 4 weeks out from surgery ending on 10/19 and then transition back to once daily aspirin 5. Recommend palliative following goals of care in the outpatient setting 6. Needs a dysphagia 1 diet at pretty high risk for aspiration 7. Note dosage changes of various medications including discontinuation of Jardiance as well as Metformin and holding several blood pressure agents in favor of just metoprolol 8. Will need Foley catheter ongoing given risk of debility and decubiti and can reassess this as an outpatient will discharge with Foley catheter  Discharge Diagnoses:  Principal Problem:   Status post hip hemiarthroplasty Active Problems:   Closed left hip fracture (HCC)   S/P hip hemiarthroplasty   Pressure injury of skin   Tachycardia   Discharge Condition: Fair only  Diet recommendation: Diabetic heart healthy  Filed Weights   08/06/20 1636 08/07/20 1415 08/07/20 1907  Weight: 87.5 kg 87.5 kg 78 kg    History of present illness:  79 year old male DM TY 2 with vascular disease, HTN, HLD, glaucoma, iron deficiency anemia, BPH, 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 Unclear source of fever a. 101 fever 9/22 blood culture no growth to date-has some low-grade temps of 99 -chest x-ray 9/22 - for signs of pneumonia-no further work-up b. Speech therapy reevaluated on 9/24 and patient seems to be at risk of choking c.  high risk of aspiration-continue dysphagia 1 diet 2. Accidental mechanical fall left hip and shoulder fracture 3. 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 Mobilize as tolerated with therapy will need skilled placement b. Continue sling to left arm c. WBAT to left lower extremity TED hose in addition d. Oxycodone low-dose ordered with supplemental higher dose for more severe pain-prescription given on discharge e. DVT prophylaxis aspirin 325 twice daily with TED hose until at least 09/05/2020 4. Sinus tachycardia found to be sinus tach with PAT has been started on amiodarone a. Amiodarone drip used earlier in admission discontinued in favor of metoprolol 25 twice daily-Cardiology saw the patient and signed off  b. Prior to admission Cardizem discontinued this admission 5. Early DKA with diabetes mellitus which is brittle and has had episodic hypoglycemia a. 70/30 insulin at 6 units discontinue sliding scale, CBG well controlled during this hospital stay b. Discontinue on discharge Jardiance 10 daily Metformin 500 XR daily 6. Hypernatremia, hypokalemia as well as AKI 7. Probably obstructive uropathy causing azotemia a. Potassium stable--stop replacement b. Foley catheter needed to be placed on 9/23 as he was retaining over 200 cc of urine c. Improved Vol depletion, has been saline locked d. Keep Foley on discharge unable  to move around-mobilize well e. Consider outpatient discussion about Foley catheter placement versus referral to urology-probably need to keep  catheter in general 8. Transaminitis a. Possibly secondary to not really eating-no further work-up at this stage b. Stable may be can work-up as an outpatient 9. HTN  10. HLD a. Holding Caduet at this time can resume on discharge b. At this time hold HCTZ 25 and Cozaar 100 11. BPH 12. CVA 1997         a. severe deficits on the left side and will more likely than not need skilled care 13. Prior smoker 14. Iron deficiency anemia a. Iron twice a day  Procedures:  Multiple  Consultations:  Orthopedics cardiology  Discharge Exam: Vitals:   08/13/20 2021 08/14/20 0537  BP: 109/90 (!) 152/72  Pulse: 96 96  Resp: 18 18  Temp: 99.7 F (37.6 C) 99.5 F (37.5 C)  SpO2: 100% 98%    General: Awake alert eating some with the help of nursing staff Cardiovascular: S1-S2 no murmur no rub no gallop Respiratory: Clinically clear no rales no rhonchi Abdomen soft no rebound no guarding neurologically intact to right side however left side hip is externally rotated He has dense hemiparesis on the left upper extremity and a torticollis compensating on the right side Sensory is intact he cannot finger grip with his hand He has spasm when we try to turn his neck from right to left  Discharge Instructions   Discharge Instructions    Diet - low sodium heart healthy   Complete by: As directed    Increase activity slowly   Complete by: As directed    No wound care   Complete by: As directed      Allergies as of 08/14/2020      Reactions   Tape Rash   Burns      Medication List    STOP taking these medications   amLODipine-atorvastatin 10-40 MG tablet Commonly known as: CADUET   diltiazem 360 MG 24 hr capsule Commonly known as: CARDIZEM CD   glucose blood test strip Commonly known as: Embrace Blood Glucose Test   hydrochlorothiazide 25 MG tablet Commonly known as: HYDRODIURIL   insulin NPH-regular Human (70-30) 100 UNIT/ML injection   Jardiance 10 MG Tabs  tablet Generic drug: empagliflozin   losartan 100 MG tablet Commonly known as: COZAAR   metFORMIN 500 MG 24 hr tablet Commonly known as: GLUCOPHAGE-XR   naproxen sodium 220 MG tablet Commonly known as: ALEVE     TAKE these medications   acetaminophen 325 MG tablet Commonly known as: TYLENOL Take 1-2 tablets (325-650 mg total) by mouth every 6 (six) hours as needed for mild pain (pain score 1-3 or temp > 100.5).   brimonidine 0.1 % Soln Commonly known as: ALPHAGAN P Place 1 drop into both eyes 3 (three) times daily.   Alphagan P 0.15 % ophthalmic solution Generic drug: brimonidine Place 1 drop into both eyes 3 (three) times daily.   aspirin 325 MG EC tablet Take 1 tablet (325 mg total) by mouth 2 (two) times daily after a meal. What changed:   when to take this  additional instructions   atorvastatin 40 MG tablet Commonly known as: LIPITOR Take 1 tablet (40 mg total) by mouth daily.   diclofenac sodium 1 % Gel Commonly known as: VOLTAREN Apply 1 application topically 3 (three) times daily as needed.   ferrous sulfate 325 (65 FE) MG tablet Take 325 mg by mouth daily.  insulin aspart protamine- aspart (70-30) 100 UNIT/ML injection Commonly known as: NOVOLOG MIX 70/30 Inject 0.08 mLs (8 Units total) into the skin 2 (two) times daily with a meal. What changed:   how much to take  how to take this  when to take this  additional instructions   latanoprost 0.005 % ophthalmic solution Commonly known as: XALATAN Place 1 drop into both eyes at bedtime.   methocarbamol 500 MG tablet Commonly known as: ROBAXIN Take 1 tablet (500 mg total) by mouth every 6 (six) hours as needed for muscle spasms.   metoprolol tartrate 25 MG tablet Commonly known as: LOPRESSOR Take 1 tablet (25 mg total) by mouth 2 (two) times daily.   Oxycodone HCl 10 MG Tabs Take 1-1.5 tablets (10-15 mg total) by mouth every 4 (four) hours as needed for severe pain (pain score 7-10).    oxyCODONE 5 MG immediate release tablet Commonly known as: Oxy IR/ROXICODONE Take 1-2 tablets (5-10 mg total) by mouth every 4 (four) hours as needed for moderate pain (pain score 4-6).   potassium chloride 10 MEQ tablet Commonly known as: KLOR-CON TAKE 3 TABLETS (30 MEQ TOTAL) BY MOUTH DAILY.   Vyzulta 0.024 % Soln Generic drug: Latanoprostene Bunod Apply 1 drop to eye at bedtime.      Allergies  Allergen Reactions  . Tape Rash    Burns    Follow-up Information    [name], MD. Schedule an appointment as soon as possible for a visit in 2 week(s).   Specialty: Orthopedic Surgery Why: Call sooner if issues arise. Contact information: [address] [place] [address] [phone]                The results of significant diagnostics from this hospitalization (including imaging, microbiology, ancillary and laboratory) are listed below for reference.    Significant Diagnostic Studies: DG Chest 1 View  Result Date: 08/06/2020 CLINICAL DATA:  Left leg pain related to fall. EXAM: CHEST  1 VIEW COMPARISON:  October 30, 2013 FINDINGS: The heart, hila, and mediastinum are normal. No pneumothorax. Minimal atelectasis in the left base. The lungs are otherwise clear. No other acute abnormalities. IMPRESSION: No active disease. Electronically Signed   By: Gerome Sam III M.D   On: 08/06/2020 18:08   DG Pelvis 1-2 Views  Result Date: 08/06/2020 CLINICAL DATA:  Pain after fall EXAM: PELVIS - 1-2 VIEW COMPARISON:  None. FINDINGS: A subcapital fracture seen to the left hip. Evaluation for dislocation is limited as only an AP view was obtained. No definite dislocation noted. No other bony abnormalities are identified. IMPRESSION: Subcapital fracture through the proximal left hip. Evaluation for dislocation is limited without a lateral view but there is no evidence of dislocation on this study. Electronically Signed   By: Gerome Sam III M.D   On: 08/06/2020 18:09   DG  Elbow Complete Left  Result Date: 08/06/2020 CLINICAL DATA:  Pain after fall EXAM: LEFT ELBOW - COMPLETE 3+ VIEW COMPARISON:  None. FINDINGS: Evaluation is limited due to positioning. The proximal ulna and radius are not well visualized on the first 3 images as a overlap the humerus. No joint effusion is seen on the lateral view. Mild irregularity of the radial head on the lateral view suggest the possibility of a subtle fracture. No other abnormalities. IMPRESSION: Evaluation is limited due to positioning as above. Irregularity of the radial head on the lateral view is concerning for a subtle fracture. No joint effusion identified. Electronically Signed  By: Gerome Sam III M.D   On: 08/06/2020 18:07   CT Head Wo Contrast  Result Date: 08/06/2020 CLINICAL DATA:  Status post fall. EXAM: CT HEAD WITHOUT CONTRAST TECHNIQUE: Contiguous axial images were obtained from the base of the skull through the vertex without intravenous contrast. COMPARISON:  October 30, 2013 FINDINGS: Brain: There is mild to moderate severity cerebral atrophy with widening of the extra-axial spaces and ventricular dilatation. There are areas of decreased attenuation within the white matter tracts of the supratentorial brain, consistent with microvascular disease changes. A large area of cortical encephalomalacia, with adjacent chronic white matter low attenuation, is seen throughout the right hemisphere. This involves predominantly the right parietal lobe and is unchanged in appearance when compared to the prior study. Vascular: No hyperdense vessel or unexpected calcification. Skull: Normal. Negative for fracture or focal lesion. Sinuses/Orbits: No acute finding. Other: None. IMPRESSION: 1. No acute intracranial abnormality. 2. Generalized cerebral atrophy. 3. Chronic predominant right parietal lobe infarct. Electronically Signed   By: Aram Candela M.D.   On: 08/06/2020 19:00   CT Cervical Spine Wo Contrast  Result Date:  08/06/2020 CLINICAL DATA:  Left leg pain related to fall EXAM: CT CERVICAL SPINE WITHOUT CONTRAST TECHNIQUE: Multidetector CT imaging of the cervical spine was performed without intravenous contrast. Multiplanar CT image reconstructions were also generated. COMPARISON:  None. FINDINGS: Alignment: There is straightening of the normal cervical lordosis. Skull base and vertebrae: Visualized skull base is intact. No atlanto-occipital dissociation. The vertebral body heights are well maintained. No fracture or pathologic osseous lesion seen. Soft tissues and spinal canal: The visualized paraspinal soft tissues are unremarkable. No prevertebral soft tissue swelling is seen. The spinal canal is grossly unremarkable, no large epidural collection or significant canal narrowing. Disc levels: Multilevel cervical spine spondylosis is seen with anterior osteophytes, disc osteophyte complex and uncovertebral osteophytes most notable C5-C6 and C6-C7 with moderate neural foraminal narrowing and mild central canal stenosis. Upper chest: The lung apices are clear. Thoracic inlet is within normal limits. Other: None IMPRESSION: No acute fracture or malalignment of the spine. 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 Electronically Signed   By: Jonna Clark M.D.   On: 08/06/2020 19:25   Pelvis Portable  Result Date: 08/07/2020 CLINICAL DATA:  Status post left hip replacement EXAM: PORTABLE PELVIS 1 VIEWS COMPARISON:  08/06/2020 FINDINGS: Left hip prosthesis is now seen. No acute fracture or dislocation is seen. No soft tissue abnormality is noted. IMPRESSION: Status post left hip replacement Electronically Signed   By: Alcide Clever M.D.   On: 08/07/2020 19:42   DG CHEST PORT 1 VIEW  Result Date: 08/09/2020 CLINICAL DATA:  Hyperglycemia EXAM: PORTABLE CHEST 1 VIE COMPARISON:  August 06, 2020 FINDINGS: Lungs are clear. Heart size and pulmonary vascularity are normal. No adenopathy. No bone lesions.  IMPRESSION: Lungs clear. Cardiac silhouette within normal limits. No adenopathy. Electronically Signed   By: Bretta Bang III M.D.   On: 08/09/2020 14:35   DG Shoulder Left  Addendum Date: 08/06/2020   ADDENDUM REPORT: 08/06/2020 18:05 ADDENDUM: Upon reviewing the left humeral images, there appears to be a subtle a fracture through the neck of the left humerus only seen on the true AP view. This finding was described on the humeral images. Electronically Signed   By: Gerome Sam III M.D   On: 08/06/2020 18:05   Result Date: 08/06/2020 CLINICAL DATA:  Pain after fall EXAM: LEFT SHOULDER - 2+ VIEW COMPARISON:  None. FINDINGS:  Irregularity along the inferior aspect of the glenoid suggest the possibility of previous trauma. No acute fractures or dislocations are seen on today's study. Limited views of the chest are normal. IMPRESSION: Negative. Electronically Signed: By: Gerome Sam III M.D On: 08/06/2020 18:01   DG Humerus Left  Result Date: 08/06/2020 CLINICAL DATA:  Pain after fall EXAM: LEFT HUMERUS - 2+ VIEW COMPARISON:  None. FINDINGS: There is a subtle lucency extending through the neck of the left humerus only seen on the final image. In retrospect, this is also seen on 1 of the images of the left shoulder but is very subtle. No other fractures. IMPRESSION: Subtle nondisplaced fracture through the neck of the humerus only seen on 1 image of the humeral films and 1 image of the left shoulder films. The finding is subtle but appears to be real. Electronically Signed   By: Gerome Sam III M.D   On: 08/06/2020 18:04   DG Femur Min 2 Views Left  Result Date: 08/06/2020 CLINICAL DATA:  Pain after fall. EXAM: LEFT FEMUR 2 VIEWS COMPARISON:  None. FINDINGS: Only the distal half of the femur was imaged on the lateral view. Only the distal third of the femur was imaged on the AP view. The proximal half of the femur was imaged on the lateral view. Given the lack of visualization of the entire  femur in all three views, evaluation is limited. Within these limitations, no fractures are seen. IMPRESSION: Only a portion of the femur was seen in each of the three views which limits evaluation. Within these limitations, no fractures are seen. Electronically Signed   By: Gerome Sam III M.D   On: 08/06/2020 18:00   ECHOCARDIOGRAM LIMITED  Result Date: 08/08/2020    ECHOCARDIOGRAM LIMITED REPORT   Patient Name:   Kaspian Muccio. Date of Exam: 08/08/2020 Medical Rec #:  [id number]         Height:       71.5 in Accession #:    [id number]        Weight:       172.0 lb Date of Birth:  01-22-1941          BSA:          1.988 m Patient Age:    79 years          BP:           1126/89 mmHg Patient Gender: M                 HR:           130 bpm. Exam Location:  Inpatient Procedure: 2D Echo Indications:    Abnormal EKG  History:        Patient has prior history of Echocardiogram examinations, most                 recent 03/26/2013. Stroke, Arrythmias:Atrial Fibrillation and                 Tachycardia; Risk Factors:Diabetes and Hypertension. S/p Hip                 surgery.  Sonographer:    Lavenia Atlas Referring Phys: [id number] Alessandra Bevels IMPRESSIONS  1. There is incessant arrhythmia with varying QRS morphology (appears to be atrial fibrillation with frequent PVCs), that makes assessment of regional and global left ventricular function more challenging. Left ventricular ejection fraction, by estimation, is 55 to 60%. The left ventricle has normal function.  The left ventricle has no regional wall motion abnormalities. Left ventricular diastolic function could not be evaluated.  2. Right ventricular systolic function is normal. The right ventricular size is normal. Tricuspid regurgitation signal is inadequate for assessing PA pressure.  3. The mitral valve is normal in structure. Mild mitral valve regurgitation.  4. The aortic valve is grossly normal. There is mild calcification of the aortic valve. Aortic  valve regurgitation is not visualized. Mild aortic valve sclerosis is present, with no evidence of aortic valve stenosis. FINDINGS  Left Ventricle: There is incessant arrhythmia with varying QRS morphology (appears to be atrial fibrillation with frequent PVCs), that makes assessment of regional and global left ventricular function more challenging. Left ventricular ejection fraction, by estimation, is 55 to 60%. The left ventricle has normal function. The left ventricle has no regional wall motion abnormalities. There is no left ventricular hypertrophy. Left ventricular diastolic function could not be evaluated. Left ventricular diastolic function could not be evaluated due to atrial fibrillation. Right Ventricle: The right ventricular size is normal. No increase in right ventricular wall thickness. Right ventricular systolic function is normal. Tricuspid regurgitation signal is inadequate for assessing PA pressure. Right Atrium: Right atrial size was normal in size. Pericardium: There is no evidence of pericardial effusion. Mitral Valve: The mitral valve is normal in structure. Mild mitral valve regurgitation. Tricuspid Valve: The tricuspid valve is grossly normal. Tricuspid valve regurgitation is not demonstrated. Aortic Valve: The aortic valve is grossly normal. There is mild calcification of the aortic valve. Aortic valve regurgitation is not visualized. Mild aortic valve sclerosis is present, with no evidence of aortic valve stenosis. Pulmonic Valve: The pulmonic valve was not well visualized. Pulmonic valve regurgitation is not visualized. Aorta: The aortic root and ascending aorta are structurally normal, with no evidence of dilitation. Venous: The inferior vena cava was not well visualized. IAS/Shunts: The interatrial septum was not assessed. Thurmon FairMihai Croitoru MD Electronically signed by Thurmon FairMihai Croitoru MD Signature Date/Time: 08/08/2020/3:12:26 PM    Final     Microbiology: Recent Results (from the past 240  hour(s))  SARS Coronavirus 2 by RT PCR (hospital order, performed in [facility] hospital lab) Nasopharyngeal Nasopharyngeal Swab     Status: None   Collection Time: 08/06/20  6:01 PM   Specimen: Nasopharyngeal Swab  Result Value Ref Range Status   SARS Coronavirus 2 NEGATIVE NEGATIVE Final    Comment: (NOTE) SARS-CoV-2 target nucleic acids are NOT DETECTED.  The SARS-CoV-2 RNA is generally detectable in upper and lower respiratory specimens during the acute phase of infection. The lowest concentration of SARS-CoV-2 viral copies this assay can detect is 250 copies / mL. A negative result does not preclude SARS-CoV-2 infection and should not be used as the sole basis for treatment or other patient management decisions.  A negative result may occur with improper specimen collection / handling, submission of specimen other than nasopharyngeal swab, presence of viral mutation(s) within the areas targeted by this assay, and inadequate number of viral copies (<250 copies / mL). A negative result must be combined with clinical observations, patient history, and epidemiological information.  Fact Sheet for Patients:   BoilerBrush.com.cyhttps://www.fda.gov/media/136312/download  Fact Sheet for Healthcare Providers: https://pope.com/https://www.fda.gov/media/136313/download  This test is not yet approved or  cleared by the [place] FDA and has been authorized for detection and/or diagnosis of SARS-CoV-2 by FDA under an Emergency Use Authorization (EUA).  This EUA will remain in effect (meaning this test can be used) for the duration of the COVID-19  declaration under Section 564(b)(1) of the Act, 21 U.S.C. section 360bbb-3(b)(1), unless the authorization is terminated or revoked sooner.  Performed at [facility], [address] [place], [place] [address]   MRSA PCR Screening     Status: None   Collection Time: 08/07/20  6:59 PM   Specimen: Nasopharyngeal  Result Value Ref Range Status   MRSA by  PCR NEGATIVE NEGATIVE Final    Comment:        The GeneXpert MRSA Assay (FDA approved for NASAL specimens only), is one component of a comprehensive MRSA colonization surveillance program. It is not intended to diagnose MRSA infection nor to guide or monitor treatment for MRSA infections. Performed at [facility], [address], [place], [place] [address]   Culture, blood (routine x 2)     Status: None   Collection Time: 08/07/20  7:18 PM   Specimen: BLOOD RIGHT HAND  Result Value Ref Range Status   Specimen Description   Final    BLOOD RIGHT HAND Performed at [facility], [address], [place], [place] [address]    Special Requests   Final    BOTTLES DRAWN AEROBIC AND ANAEROBIC Blood Culture adequate volume Performed at [facility], [address], [place], [place] [address]    Culture   Final    NO GROWTH 5 DAYS Performed at [facility], [address], [place], [place] [address]    Report Status 08/12/2020 FINAL  Final  Culture, blood (routine x 2)     Status: None   Collection Time: 08/07/20  7:18 PM   Specimen: BLOOD LEFT HAND  Result Value Ref Range Status   Specimen Description   Final    BLOOD LEFT HAND Performed at [facility], [address], [place], [place] [address]    Special Requests   Final    BOTTLES DRAWN AEROBIC AND ANAEROBIC Blood Culture results may not be optimal due to an inadequate volume of blood received in culture bottles Performed at [facility], [address] [place], [place] [address]    Culture   Final    NO GROWTH 5 DAYS Performed at [facility], [address] [place], [place] [address]    Report Status 08/12/2020 FINAL  Final  Culture, blood (Routine X 2) w Reflex to ID Panel     Status: None   Collection Time: 08/09/20  3:05 PM   Specimen: BLOOD RIGHT HAND  Result Value Ref Range Status   Specimen Description    Final    BLOOD RIGHT HAND Performed at [facility], [address] [place], [place] [address]    Special Requests   Final    BOTTLES DRAWN AEROBIC ONLY Blood Culture adequate volume Performed at [facility], [address] [place], [place] [address]    Culture   Final    NO GROWTH 5 DAYS Performed at [facility], [address], [place], [place] [address]    Report Status 08/14/2020 FINAL  Final  Culture, blood (Routine X 2) w Reflex to ID Panel     Status: None   Collection Time: 08/09/20  3:06 PM   Specimen: BLOOD RIGHT HAND  Result Value Ref Range Status   Specimen Description   Final    BLOOD RIGHT HAND Performed at [facility], [address] [address], [place], [place] [address]    Special  Requests   Final    BOTTLES DRAWN AEROBIC ONLY Blood Culture results may not be optimal due to an inadequate volume of blood received in culture bottles Performed at [facility], [address], [place], [place] [address]    Culture   Final    NO GROWTH 5 DAYS Performed at [facility], [address] [place], [place] [address]    Report Status 08/14/2020 FINAL  Final  SARS CORONAVIRUS 2 (TAT 6-24 HRS) Nasopharyngeal Nasopharyngeal Swab     Status: None   Collection Time: 08/13/20 12:40 PM   Specimen: Nasopharyngeal Swab  Result Value Ref Range Status   SARS Coronavirus 2 NEGATIVE NEGATIVE Final    Comment: (NOTE) SARS-CoV-2 target nucleic acids are NOT DETECTED.  The SARS-CoV-2 RNA is generally detectable in upper and lower respiratory specimens during the acute phase of infection. Negative results do not preclude SARS-CoV-2 infection, do not rule out co-infections with other pathogens, and should not be used as the sole basis for treatment or other patient management decisions. Negative results must be combined with clinical observations, patient history, and epidemiological information. The  expected result is Negative.  Fact Sheet for Patients: HairSlick.no  Fact Sheet for Healthcare Providers: quierodirigir.com  This test is not yet approved or cleared by the [place] FDA and  has been authorized for detection and/or diagnosis of SARS-CoV-2 by FDA under an Emergency Use Authorization (EUA). This EUA will remain  in effect (meaning this test can be used) for the duration of the COVID-19 declaration under Se ction 564(b)(1) of the Act, 21 U.S.C. section 360bbb-3(b)(1), unless the authorization is terminated or revoked sooner.  Performed at [facility], [address], [place], [place] [address]      Labs: Basic Metabolic Panel: Recent Labs  Lab 08/07/20 1918 08/08/20 0026 08/09/20 0246 08/10/20 0452 08/11/20 0553 08/12/20 0540 08/13/20 0548  NA 153*   < > 144 149* 146* 146* 142  K 4.5   < > 3.3* 3.4* 3.7 3.8 3.9  CL 109   < > 106 110 108 108 110  CO2 25   < > 27 27 26 26 22   GLUCOSE 108*   < > 174* 60* 171* 221* 161*  BUN 28*   < > 18 26* 25* 25* 24*  CREATININE 1.26*   < > 1.21 1.40* 1.33* 1.34* 1.04  CALCIUM 8.9   < > 7.8* 8.0* 7.7* 7.9* 7.6*  MG 2.6*  --  2.2 2.6*  --   --   --    < > = values in this interval not displayed.   Liver Function Tests: Recent Labs  Lab 08/10/20 0452 08/11/20 0553 08/12/20 0540  AST 89* 129* 109*  ALT 36 52* 54*  ALKPHOS 116 139* 146*  BILITOT 1.4* 1.2 1.1  PROT 6.5 6.3* 6.4*  ALBUMIN 2.3* 2.2* 2.1*   No results for input(s): LIPASE, AMYLASE in the last 168 hours. No results for input(s): AMMONIA in the last 168 hours. CBC: Recent Labs  Lab 08/09/20 0246 08/10/20 0452 08/11/20 0553 08/12/20 0540 08/13/20 0548  WBC 10.6* 8.8 10.4 10.1 10.7*  NEUTROABS  --   --  7.1 7.0 7.8*  HGB 12.4* 12.3* 11.9* 11.3* 11.6*  HCT 36.4* 36.1* 34.3* 33.1* 33.2*  MCV 74.4* 75.1* 73.8* 74.0* 73.5*  PLT 133* 156 165 193 230   Cardiac Enzymes: No results for  input(s): CKTOTAL, CKMB, CKMBINDEX, TROPONINI in the last 168 hours. BNP: BNP (  last 3 results) No results for input(s): BNP in the last 8760 hours.  ProBNP (last 3 results) No results for input(s): PROBNP in the last 8760 hours.  CBG: Recent Labs  Lab 08/13/20 0754 08/13/20 1214 08/13/20 1722 08/13/20 2218 08/14/20 0734  GLUCAP 169* 191* 127* 134* 148*       Signed:  Rhetta Mura MD   [facility] 08/14/2020, 8:44 AM

## 2020-08-14 NOTE — TOC Transition Note (Addendum)
  Transition of Care [facility]) - CM/SW Discharge Note   Patient Details  Name: Marcus Downs. MRN: [id number] Date of Birth: 1940-11-30  [facility] CM/SW Contact:  Lanier Clam, RN Phone Number: 08/14/2020, 11:30 AM   Clinical Narrative: [facility] awaiting rm,tel#report. Will call [facility] once ready.   -going to rm #100,nsg call report tel#[phone]. [facility] called for 3:30p pick up. No further CM needs.    Final next level of care: Skilled Nursing Facility Barriers to Discharge: No Barriers Identified   Patient Goals and CMS Choice Patient states their goals for this hospitalization and ongoing recovery are:: SNF CMS Medicare.gov Compare Post Acute Care list provided to:: Patient Choice offered to / list presented to : Patient  Discharge Placement                       Discharge Plan and Services   Discharge Planning Services: CM Consult                                 Social Determinants of Health (SDOH) Interventions     Readmission Risk Interventions No flowsheet data found.

## 2020-08-15 ENCOUNTER — Encounter: Payer: Self-pay | Admitting: Internal Medicine

## 2020-08-15 ENCOUNTER — Encounter: Payer: Self-pay | Admitting: Physician Assistant

## 2020-08-15 ENCOUNTER — Non-Acute Institutional Stay (SKILLED_NURSING_FACILITY): Payer: Medicare Other | Admitting: Internal Medicine

## 2020-08-15 DIAGNOSIS — I69354 Hemiplegia and hemiparesis following cerebral infarction affecting left non-dominant side: Secondary | ICD-10-CM | POA: Diagnosis not present

## 2020-08-15 DIAGNOSIS — Z789 Other specified health status: Secondary | ICD-10-CM

## 2020-08-15 DIAGNOSIS — E1159 Type 2 diabetes mellitus with other circulatory complications: Secondary | ICD-10-CM | POA: Diagnosis not present

## 2020-08-15 DIAGNOSIS — D62 Acute posthemorrhagic anemia: Secondary | ICD-10-CM

## 2020-08-15 DIAGNOSIS — I69391 Dysphagia following cerebral infarction: Secondary | ICD-10-CM

## 2020-08-15 DIAGNOSIS — E1110 Type 2 diabetes mellitus with ketoacidosis without coma: Secondary | ICD-10-CM

## 2020-08-15 DIAGNOSIS — N139 Obstructive and reflux uropathy, unspecified: Secondary | ICD-10-CM | POA: Diagnosis not present

## 2020-08-15 DIAGNOSIS — I471 Supraventricular tachycardia: Secondary | ICD-10-CM

## 2020-08-15 DIAGNOSIS — Z978 Presence of other specified devices: Secondary | ICD-10-CM | POA: Diagnosis not present

## 2020-08-15 DIAGNOSIS — S72002A Fracture of unspecified part of neck of left femur, initial encounter for closed fracture: Secondary | ICD-10-CM | POA: Diagnosis not present

## 2020-08-15 DIAGNOSIS — I4719 Other supraventricular tachycardia: Secondary | ICD-10-CM

## 2020-08-15 DIAGNOSIS — Z96649 Presence of unspecified artificial hip joint: Secondary | ICD-10-CM | POA: Diagnosis not present

## 2020-08-15 DIAGNOSIS — R7401 Elevation of levels of liver transaminase levels: Secondary | ICD-10-CM | POA: Diagnosis not present

## 2020-08-15 DIAGNOSIS — R339 Retention of urine, unspecified: Secondary | ICD-10-CM | POA: Diagnosis not present

## 2020-08-15 DIAGNOSIS — G243 Spasmodic torticollis: Secondary | ICD-10-CM

## 2020-08-15 NOTE — Progress Notes (Signed)
Provider:  Gwenith Spitz. Renato Gails, D.O., C.M.D. Location:  [facility] Room Number: 100P Place of Service:  SNF (31)  PCP: [name], MD Patient Care Team: [name], MD as PCP - General [name], MD as PCP - Cardiology (Cardiology) [name], MD as Consulting Physician (Ophthalmology)  Extended Emergency Contact Information Primary Emergency Contact: [place] Address: [address]          [place], [place] [address] D[place] of [place] Home Phone: [phone] Relation: Sister  Code Status: FULL CODE Goals of Care: Advanced Directive information Advanced Directives 08/15/2020  Does Patient Have a Medical Advance Directive? No  Type of Advance Directive -  Does patient want to make changes to medical advance directive? No - Patient declined  Would patient like information on creating a medical advance directive? -   Chief Complaint  Patient presents with  . New Admit To SNF    New Admit     HPI: Patient is a Marcus Downs seen today for admission to [facility] s/p hospitalization at [facility] from 9/19-9/27/21.   He has a past medical history significant for type 2 diabetes with vascular disease, hypertension, hyperlipidemia, and prior stroke with left-sided weakness.  He had presented to the emergency department status post mechanical fall with left hip pain.  He was getting clothing out of his dryer when he slipped, lost his balance and fell onto his left side with subsequent left hip and shoulder pain.  He had no presyncope or syncope and no prodromal symptoms.  In the ED he was tachycardic and hypertensive but otherwise vitals were normal.  Labs revealed CBG of 469 BUN 49 creatinine of 1.74 while baseline was 1.31.  He was given a bolus of normal saline.  His noncontrast CT of the head showed generalized cerebral atrophy, chronic predominant right parietal lobe infarct but no acute intracranial  abnormality and CT of the C-spine showed spondylosis most notable at C5-7 without acute fracture or malalignment.  EKG was consistent with sinus rhythm with some ectopic atrial tachycardia at 128 and multiple PVCs with a right bundle branch block.  Covid test was negative.  Chest x-ray showed minimal atelectasis in the left base with otherwise normal no acute abnormalities.  Left elbow x-ray revealed irregularity at the radial head on the lateral view concerning for subtle fracture.  Left femur x-ray showed subcapital fracture through the proximal left hip.  Left humerus fracture was identified through the neck of the humerus.  Was felt he had mild DKA due to his elevated CBG mild acidosis and anion gap of 20, hypernatremia and hypokalemia.  It improved with the sodium bolus and he was placed on supplemental coverage with NovoLog protocol.  His Cozaar and hydrochlorothiazide were initially held and BMP followed.  Sliding scale was eventually discontinued and he was placed on 70/30 insulin at 8 units.  CBGs were well controlled.  Jardiance and Metformin were both discontinued at discharge from the hospital.  Dr. Luiz Blare evaluated him and performed the left hip bipolar hemiarthroplasty on September 20.  He is on aspirin twice daily prophylaxis for 4 weeks for DVT and compression hose.  He will then transition back to daily aspirin after October 19.  He is to follow-up with Dr. Luiz Blare in 2 weeks  He had a fever of 101 on September 22 blood culture showed no growth to date.  He continued to have some low-grade temperatures of 99 but  chest x-ray showed no pneumonia.  Speech therapy evaluated him on September 24 and felt he was high risk for choking and aspiration he was continued on a dysphagia 1 diet.  He also had positional torticollis related to his shoulder injury and combination with his prior stroke.  Sling was placed on the left arm.  He was given oxycodone for pain management and Robaxin also was  recommended.  He was having sinus tachycardia with paroxysmal atrial tachycardia was temporarily started on amiodarone which was then converted to metoprolol 25 mg twice a day.  His Cardizem had been discontinued at the time of admission.  He was found to have probable obstructive uropathy that caused his azotemia.  His potassium remained stable and the supplement was stopped.  Foley was placed on September 23 due to retention of over 200 cc of urine.  He was kept at discharge.  Mobilization was encouraged and he will need follow-up with outpatient urology.  He had transaminitis and a follow-up CMP was recommended.  Course, he had postop anemia including iron deficiency and was placed on twice daily iron.  Goals of care discussion was recommended to be held outside of the hospital.    He has had his J+J covid vaccine on 02/24/20.  Past Medical History:  Diagnosis Date  . CRI (chronic renal insufficiency)   . 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 Diabetes mellitus type II 1992  . HLD (hyperlipidemia) 10/1998  . HTN (hypertension) 10/1998  . Primary open angle glaucoma    [name]  . RBBB   . Stroke [facility])    Left sided weakness   Past Surgical History:  Procedure Laterality Date  . CATARACT EXTRACTION W/PHACO  09/09/2012   Procedure: CATARACT EXTRACTION PHACO AND INTRAOCULAR LENS PLACEMENT (IOC);  Surgeon: [name], MD;  Location: [facility] OR;  Service: Ophthalmology;  Laterality: Right;  . CATARACT EXTRACTION W/PHACO Left 03/31/2013   Procedure: CATARACT EXTRACTION PHACO AND INTRAOCULAR LENS PLACEMENT (IOC);  Surgeon: [name], MD;  Location: [facility] OR;  Service: Ophthalmology;  Laterality: Left;  . EYE SURGERY    . HIP ARTHROPLASTY Left 08/07/2020   Procedure: ARTHROPLASTY BIPOLAR HIP (HEMIARTHROPLASTY);  Surgeon: [name], MD;  Location: [facility] ORS;  Service: Orthopedics;  Laterality: Left;  . lipoma removal  1970's   Right shoulder  . US  ECHOCARDIOGRAPHY  03/2013   mod LVH, EF 60%, normal wall motion    Social History   Socioeconomic History  . Marital status: Divorced    Spouse name: Not on file  . Number of children: 2  . Years of education: Not on file  . Highest education level: Not on file  Occupational History  . Occupation: Retired medically from [employer]  Tobacco Use  . Smoking status: Former Smoker    Packs/day: 1.00    Years: 30.00    Pack years: 30.00    Types: Cigarettes    Quit date: 09/07/1996    Years since quitting: 23.9  . Smokeless tobacco: Never Used  . Tobacco comment: quit after CVA  Vaping Use  . Vaping Use: Never used  Substance and Sexual Activity  . Alcohol use: No  . Drug use: No  . Sexual activity: Not Currently  Other Topics Concern  . Not on file  Social History Narrative   Caffeine: neg   Lives alone. Friend comes and helps sometimes. Son's brother in law helps with medicines. Family nearby - 2 sisters and nieces/nephews  in area.   Sons live on other side of [place]       Ophtho - Dr. Harlon Flor at [facility] [employer]   Social Determinants of Health   Financial Resource Strain:   . Difficulty of Paying Living Expenses: Not on file  Food Insecurity:   . Worried About Programme researcher, broadcasting/film/video in the Last Year: Not on file  . Ran Out of Food in the Last Year: Not on file  Transportation Needs:   . Lack of Transportation (Medical): Not on file  . Lack of Transportation (Non-Medical): Not on file  Physical Activity:   . Days of Exercise per Week: Not on file  . Minutes of Exercise per Session: Not on file  Stress:   . Feeling of Stress : Not on file  Social Connections:   . Frequency of Communication with Friends and Family: Not on file  . Frequency of Social Gatherings with Friends and Family: Not on file  . Attends Religious Services: Not on file  . Active Member of Clubs or Organizations: Not on file  . Attends Banker Meetings: Not on  file  . Marital Status: Not on file    reports that he quit smoking about 23 years ago. His smoking use included cigarettes. He has a 30.00 pack-year smoking history. He has never used smokeless tobacco. He reports that he does not drink alcohol and does not use drugs.  Functional Status Survey:    Family History  Problem Relation Age of Onset  . Heart attack Father 56  . Hypertension Sister        Multiple medical problems  . Diabetes Sister   . Stroke Neg Hx   . Cancer Neg Hx     Health Maintenance  Topic Date Due  . OPHTHALMOLOGY EXAM  01/07/2020  . INFLUENZA VACCINE  06/18/2020  . FOOT EXAM  08/01/2020  . TETANUS/TDAP  11/17/2020 (Originally 01/27/2019)  . HEMOGLOBIN A1C  02/03/2021  . COVID-19 Vaccine  Completed  . Hepatitis C Screening  Completed  . PNA vac Low Risk Adult  Completed    Allergies  Allergen Reactions  . Tape Rash    Burns    Outpatient Encounter Medications as of 08/15/2020  Medication Sig  . acetaminophen (TYLENOL) 325 MG tablet Take 1-2 tablets (325-650 mg total) by mouth every 6 (six) hours as needed for mild pain (pain score 1-3 or temp > 100.5).  Marland Kitchen aspirin EC 325 MG EC tablet Take 1 tablet (325 mg total) by mouth 2 (two) times daily after a meal.  . atorvastatin (LIPITOR) 40 MG tablet Take 1 tablet (40 mg total) by mouth daily.  . bisacodyl (DULCOLAX) 10 MG suppository Place 10 mg rectally as needed for moderate constipation.  . brimonidine (ALPHAGAN P) 0.1 % SOLN Place 1 drop into both eyes 3 (three) times daily.  . diclofenac sodium (VOLTAREN) 1 % GEL Apply 1 application topically 3 (three) times daily as needed.  . ferrous sulfate 325 (65 FE) MG tablet Take 325 mg by mouth daily.   . insulin aspart protamine- aspart (NOVOLOG MIX 70/30) (70-30) 100 UNIT/ML injection Inject 0.08 mLs (8 Units total) into the skin 2 (two) times daily with a meal.  . latanoprost (XALATAN) 0.005 % ophthalmic solution Place 1 drop into both eyes at bedtime.  .  Latanoprostene Bunod (VYZULTA) 0.024 % SOLN Apply 1 drop to eye at bedtime.   . magnesium hydroxide (MILK OF MAGNESIA) 400 MG/5ML suspension Take  by mouth daily as needed for mild constipation.  . methocarbamol (ROBAXIN) 500 MG tablet Take 1 tablet (500 mg total) by mouth every 6 (six) hours as needed for muscle spasms.  . metoprolol tartrate (LOPRESSOR) 25 MG tablet Take 1 tablet (25 mg total) by mouth 2 (two) times daily.  Marland Kitchen oxyCODONE 10 MG TABS Take 1-1.5 tablets (10-15 mg total) by mouth every 4 (four) hours as needed for severe pain (pain score 7-10).  . potassium chloride (KLOR-CON) 10 MEQ tablet TAKE 3 TABLETS (30 MEQ TOTAL) BY MOUTH DAILY.  . [DISCONTINUED] oxyCODONE (OXY IR/ROXICODONE) 5 MG immediate release tablet Take 1-2 tablets (5-10 mg total) by mouth every 4 (four) hours as needed for moderate pain (pain score 4-6).  . [DISCONTINUED] brimonidine (ALPHAGAN P) 0.15 % ophthalmic solution Place 1 drop into both eyes 3 (three) times daily. (Patient not taking: Reported on 08/06/2020)   No facility-administered encounter medications on file as of 08/15/2020.    Review of Systems  Constitutional: Negative for chills and fever.  HENT: Negative for congestion and sore throat.   Eyes: Negative for blurred vision.  Respiratory: Negative for cough and shortness of breath.   Cardiovascular: Negative for chest pain, palpitations and leg swelling.  Gastrointestinal: Negative for abdominal pain, blood in stool, constipation, diarrhea and melena.  Genitourinary: Negative for dysuria.       Foley in place, had been pulling on it per nurse and had some clots and urine appeared orange from hematuria  Musculoskeletal: Positive for falls and myalgias. Negative for joint pain.  Skin: Negative for itching and rash.  Neurological: Positive for speech change and focal weakness. Negative for dizziness and loss of consciousness.       Dysarthria, hypophonia, hemiplegia  Endo/Heme/Allergies: Negative for  environmental allergies.  Psychiatric/Behavioral: Positive for memory loss. Negative for depression. The patient is not nervous/anxious and does not have insomnia.        Nurse notes confusion; with me, he wanted to call his son and much of what he was saying made sense at that time and he gave me a phone number    Vitals:   08/15/20 1408  BP: (!) 143/82  Pulse: 100  Temp: (!) 96.8 F (36 C)  Weight: 171 lb 15.4 oz (Marcus kg)  Height: 5' 11.5" (1.816 m)   Body mass index is 23.65 kg/m. Physical Exam Vitals reviewed.  Constitutional:      General: He is not in acute distress.    Appearance: He is not toxic-appearing.  HENT:     Head: Normocephalic and atraumatic.     Right Ear: External ear normal.     Left Ear: External ear normal.     Mouth/Throat:     Pharynx: Oropharynx is clear.  Eyes:     Extraocular Movements: Extraocular movements intact.     Conjunctiva/sclera: Conjunctivae normal.     Pupils: Pupils are equal, round, and reactive to light.  Cardiovascular:     Rate and Rhythm: Regular rhythm. Tachycardia present.     Heart sounds: Normal heart sounds. No murmur heard.   Pulmonary:     Effort: Pulmonary effort is normal.     Breath sounds: Normal breath sounds. No wheezing, rhonchi or rales.  Abdominal:     General: Bowel sounds are normal.     Palpations: Abdomen is soft.     Tenderness: There is no abdominal tenderness. There is no right CVA tenderness, left CVA tenderness, guarding or rebound.  Genitourinary:  Comments: No suprapubic tenderness; foley in place with orange urine with clots in it Musculoskeletal:     Cervical back: Neck supple.     Right lower leg: No edema.     Left lower leg: No edema.     Comments: Hemiplegia, left arm in sling  Skin:    General: Skin is warm and dry.     Capillary Refill: Capillary refill takes less than 2 seconds.     Comments: Left hip incision site without significant drainage, swelling, erythema, dressings intact    Neurological:     Mental Status: He is alert.     Cranial Nerves: Cranial nerve deficit present.     Sensory: Sensory deficit present.     Motor: Weakness present.     Coordination: Coordination abnormal.     Gait: Gait abnormal.     Comments: Hemiplegia, dysarthria, hypophonia  Psychiatric:     Comments: Very difficult to get a sense of his mood with his soft speech and not clear if what he was telling me was accurate since I don't know him from before     Labs reviewed: Basic Metabolic Panel: Recent Labs    08/07/20 1918 08/08/20 0026 08/09/20 0246 08/09/20 0246 08/10/20 0452 08/10/20 0452 08/11/20 0553 08/12/20 0540 08/13/20 0548  NA 153*   < > 144   < > 149*   < > 146* 146* 142  K 4.5   < > 3.3*   < > 3.4*   < > 3.7 3.8 3.9  CL 109   < > 106   < > 110   < > 108 108 110  CO2 25   < > 27   < > 27   < > 26 26 22   GLUCOSE 108*   < > 174*   < > 60*   < > 171* 221* 161*  BUN 28*   < > 18   < > 26*   < > 25* 25* 24*  CREATININE 1.26*   < > 1.21   < > 1.40*   < > 1.33* 1.34* 1.04  CALCIUM 8.9   < > 7.8*   < > 8.0*   < > 7.7* 7.9* 7.6*  MG 2.6*  --  2.2  --  2.6*  --   --   --   --    < > = values in this interval not displayed.   Liver Function Tests: Recent Labs    08/10/20 0452 08/11/20 0553 08/12/20 0540  AST 89* 129* 109*  ALT 36 52* 54*  ALKPHOS 116 139* 146*  BILITOT 1.4* 1.2 1.1  PROT 6.5 6.3* 6.4*  ALBUMIN 2.3* 2.2* 2.1*   Recent Labs    08/06/20 1801  LIPASE 19   No results for input(s): AMMONIA in the last 8760 hours. CBC: Recent Labs    08/11/20 0553 08/12/20 0540 08/13/20 0548  WBC 10.4 10.1 10.7*  NEUTROABS 7.1 7.0 7.8*  HGB 11.9* 11.3* 11.6*  HCT 34.3* 33.1* 33.2*  MCV 73.8* 74.0* 73.5*  PLT 165 193 230   Cardiac Enzymes: No results for input(s): CKTOTAL, CKMB, CKMBINDEX, TROPONINI in the last 8760 hours. BNP: Invalid input(s): POCBNP Lab Results  Component Value Date   HGBA1C 7.7 (H) 08/06/2020   Lab Results  Component Value  Date   TSH 1.369 08/07/2020   No results found for: VITAMINB12 No results found for: FOLATE Lab Results  Component Value Date   IRON 142 11/01/2019   FERRITIN  13.5 (L) 05/01/2020    Imaging and Procedures obtained prior to SNF admission: DG Chest 1 View  Result Date: 08/06/2020 CLINICAL DATA:  Left leg pain related to fall. EXAM: CHEST  1 VIEW COMPARISON:  October 30, 2013 FINDINGS: The heart, hila, and mediastinum are normal. No pneumothorax. Minimal atelectasis in the left base. The lungs are otherwise clear. No other acute abnormalities. IMPRESSION: No active disease. Electronically Signed   By: Gerome Samavid  Williams III M.D   On: 08/06/2020 18:08   DG Pelvis 1-2 Views  Result Date: 08/06/2020 CLINICAL DATA:  Pain after fall EXAM: PELVIS - 1-2 VIEW COMPARISON:  None. FINDINGS: A subcapital fracture seen to the left hip. Evaluation for dislocation is limited as only an AP view was obtained. No definite dislocation noted. No other bony abnormalities are identified. IMPRESSION: Subcapital fracture through the proximal left hip. Evaluation for dislocation is limited without a lateral view but there is no evidence of dislocation on this study. Electronically Signed   By: Gerome Samavid  Williams III M.D   On: 08/06/2020 18:09   DG Elbow Complete Left  Result Date: 08/06/2020 CLINICAL DATA:  Pain after fall EXAM: LEFT ELBOW - COMPLETE 3+ VIEW COMPARISON:  None. FINDINGS: Evaluation is limited due to positioning. The proximal ulna and radius are not well visualized on the first 3 images as a overlap the humerus. No joint effusion is seen on the lateral view. Mild irregularity of the radial head on the lateral view suggest the possibility of a subtle fracture. No other abnormalities. IMPRESSION: Evaluation is limited due to positioning as above. Irregularity of the radial head on the lateral view is concerning for a subtle fracture. No joint effusion identified. Electronically Signed   By: Gerome Samavid  Williams III M.D    On: 08/06/2020 18:07   CT Head Wo Contrast  Result Date: 08/06/2020 CLINICAL DATA:  Status post fall. EXAM: CT HEAD WITHOUT CONTRAST TECHNIQUE: Contiguous axial images were obtained from the base of the skull through the vertex without intravenous contrast. COMPARISON:  October 30, 2013 FINDINGS: Brain: There is mild to moderate severity cerebral atrophy with widening of the extra-axial spaces and ventricular dilatation. There are areas of decreased attenuation within the white matter tracts of the supratentorial brain, consistent with microvascular disease changes. A large area of cortical encephalomalacia, with adjacent chronic white matter low attenuation, is seen throughout the right hemisphere. This involves predominantly the right parietal lobe and is unchanged in appearance when compared to the prior study. Vascular: No hyperdense vessel or unexpected calcification. Skull: Normal. Negative for fracture or focal lesion. Sinuses/Orbits: No acute finding. Other: None. IMPRESSION: 1. No acute intracranial abnormality. 2. Generalized cerebral atrophy. 3. Chronic predominant right parietal lobe infarct. Electronically Signed   By: Aram Candelahaddeus  Houston M.D.   On: 08/06/2020 19:00   CT Cervical Spine Wo Contrast  Result Date: 08/06/2020 CLINICAL DATA:  Left leg pain related to fall EXAM: CT CERVICAL SPINE WITHOUT CONTRAST TECHNIQUE: Multidetector CT imaging of the cervical spine was performed without intravenous contrast. Multiplanar CT image reconstructions were also generated. COMPARISON:  None. FINDINGS: Alignment: There is straightening of the normal cervical lordosis. Skull base and vertebrae: Visualized skull base is intact. No atlanto-occipital dissociation. The vertebral body heights are well maintained. No fracture or pathologic osseous lesion seen. Soft tissues and spinal canal: The visualized paraspinal soft tissues are unremarkable. No prevertebral soft tissue swelling is seen. The spinal canal is  grossly unremarkable, no large epidural collection or significant canal narrowing. Disc levels:  Multilevel cervical spine spondylosis is seen with anterior osteophytes, disc osteophyte complex and uncovertebral osteophytes most notable C5-C6 and C6-C7 with moderate neural foraminal narrowing and mild central canal stenosis. Upper chest: The lung apices are clear. Thoracic inlet is within normal limits. Other: None IMPRESSION: No acute fracture or malalignment of the spine. 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 Electronically Signed   By: Jonna Clark M.D.   On: 08/06/2020 19:25   Pelvis Portable  Result Date: 08/07/2020 CLINICAL DATA:  Status post left hip replacement EXAM: PORTABLE PELVIS 1 VIEWS COMPARISON:  08/06/2020 FINDINGS: Left hip prosthesis is now seen. No acute fracture or dislocation is seen. No soft tissue abnormality is noted. IMPRESSION: Status post left hip replacement Electronically Signed   By: Alcide Clever M.D.   On: 08/07/2020 19:42   DG Shoulder Left  Addendum Date: 08/06/2020   ADDENDUM REPORT: 08/06/2020 18:05 ADDENDUM: Upon reviewing the left humeral images, there appears to be a subtle a fracture through the neck of the left humerus only seen on the true AP view. This finding was described on the humeral images. Electronically Signed   By: Gerome Sam III M.D   On: 08/06/2020 18:05   Result Date: 08/06/2020 CLINICAL DATA:  Pain after fall EXAM: LEFT SHOULDER - 2+ VIEW COMPARISON:  None. FINDINGS: Irregularity along the inferior aspect of the glenoid suggest the possibility of previous trauma. No acute fractures or dislocations are seen on today's study. Limited views of the chest are normal. IMPRESSION: Negative. Electronically Signed: By: Gerome Sam III M.D On: 08/06/2020 18:01   DG Humerus Left  Result Date: 08/06/2020 CLINICAL DATA:  Pain after fall EXAM: LEFT HUMERUS - 2+ VIEW COMPARISON:  None. FINDINGS: There is a subtle lucency extending  through the neck of the left humerus only seen on the final image. In retrospect, this is also seen on 1 of the images of the left shoulder but is very subtle. No other fractures. IMPRESSION: Subtle nondisplaced fracture through the neck of the humerus only seen on 1 image of the humeral films and 1 image of the left shoulder films. The finding is subtle but appears to be real. Electronically Signed   By: Gerome Sam III M.D   On: 08/06/2020 18:04   DG Femur Min 2 Views Left  Result Date: 08/06/2020 CLINICAL DATA:  Pain after fall. EXAM: LEFT FEMUR 2 VIEWS COMPARISON:  None. FINDINGS: Only the distal half of the femur was imaged on the lateral view. Only the distal third of the femur was imaged on the AP view. The proximal half of the femur was imaged on the lateral view. Given the lack of visualization of the entire femur in all three views, evaluation is limited. Within these limitations, no fractures are seen. IMPRESSION: Only a portion of the femur was seen in each of the three views which limits evaluation. Within these limitations, no fractures are seen. Electronically Signed   By: Gerome Sam III M.D   On: 08/06/2020 18:00   Assessment/Plan 1. Closed fracture of left hip, initial encounter (HCC) -continues on oxycodone for pain--try to wean this to help cognitive status/delirium -use more tylenol  -also had left elbow fx--in sling  2. S/P hip hemiarthroplasty -on bid asa for 30 days, then back to daily asa thereafter -f/u with ortho as planned  3. Diabetic ketoacidosis without coma associated with type 2 diabetes mellitus (HCC) -was mild during hospitalization and resolved -cont regimen as below  4.  Type 2 diabetes mellitus with vascular disease (HCC) Lab Results  Component Value Date   HGBA1C 7.7 (H) 08/06/2020  -comes to [place] on novolog mix 8 units bid with meals so getting cbgs bid -monitor and adjust accordingly based on intake and cbgs -on asa, statin  5. 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 Dysphagia as late effect of stroke -cont ST here and dietary modifications, aspiration precautions  7. Obstructive uropathy -new with >200cc retained at hospital, foley in place  8. 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 Foley catheter in place -due to above, will need urology follow up as the need for the foley is new per hospital notes  10. Torticollis, spasmodic -has robaxin to help with this, is getting PT, OT  11. Paroxysmal atrial tachycardia (HCC) -continues to run high HR, monitor, is on metoprolol tartrate bid--if persists, may need adjustment and today's bp could tolerate increase  12. Transaminitis -f/u cmp in one week to reassess per d/c summary  13. Postoperative anemia due to acute blood loss -f/u cbc at one week, cont daily iron and bowel regimen  14. Full code status - Full code  Family/ staff Communication:   Labs/tests ordered:  Artha Stavros L. Rylan Bernard, D.O. Geriatrics [facility] [address], [place] [address] Cell Phone (Mon-Fri 8am-5pm):  [phone] On Call:  [phone] & follow prompts after 5pm & weekends Office Phone:  [phone] Office Fax:  [phone]

## 2020-08-16 ENCOUNTER — Other Ambulatory Visit: Payer: Self-pay | Admitting: Internal Medicine

## 2020-08-16 MED ORDER — OXYCODONE HCL 5 MG PO TABS
5.0000 mg | ORAL_TABLET | ORAL | 0 refills | Status: DC | PRN
Start: 2020-08-16 — End: 2020-08-21

## 2020-08-16 MED ORDER — OXYCODONE HCL 5 MG PO TABS
5.0000 mg | ORAL_TABLET | ORAL | 0 refills | Status: DC | PRN
Start: 2020-08-16 — End: 2020-08-16

## 2020-08-21 ENCOUNTER — Encounter: Payer: Self-pay | Admitting: Family

## 2020-08-21 ENCOUNTER — Non-Acute Institutional Stay (SKILLED_NURSING_FACILITY): Payer: Medicare Other | Admitting: Family

## 2020-08-21 DIAGNOSIS — E1159 Type 2 diabetes mellitus with other circulatory complications: Secondary | ICD-10-CM | POA: Diagnosis not present

## 2020-08-21 LAB — CBC AND DIFFERENTIAL
HCT: 36 — AB (ref 41–53)
Hemoglobin: 11.8 — AB (ref 13.5–17.5)
Platelets: 435 — AB (ref 150–399)
WBC: 9.7

## 2020-08-21 LAB — COMPREHENSIVE METABOLIC PANEL
Calcium: 8.5 — AB (ref 8.7–10.7)
GFR calc Af Amer: >90
GFR calc non Af Amer: 83.93
Globulin: 3.6

## 2020-08-21 LAB — BASIC METABOLIC PANEL
BUN: 10 (ref 4–21)
CO2: 25 — AB (ref 13–22)
Chloride: 102 (ref 99–108)
Creatinine: 0.8 (ref 0.6–1.3)
Glucose: 423
Potassium: 4.7 (ref 3.4–5.3)
Sodium: 138 (ref 137–147)

## 2020-08-21 LAB — HEPATIC FUNCTION PANEL
ALT: 21 (ref 10–40)
AST: 23 (ref 14–40)

## 2020-08-21 LAB — CBC: RBC: 4.6 (ref 3.87–5.11)

## 2020-08-21 NOTE — Progress Notes (Signed)
Location:    [facility].   Nursing Home Room Number: 100-P Place of Service:  SNF (31) Provider:  Richarda Blade, NP  Patient Care Team: [name], MD as PCP - General [name], MD as PCP - Cardiology (Cardiology) [name], MD as Consulting Physician (Ophthalmology)  Extended Emergency Contact Information Primary Emergency Contact: [place] Address: [address]          [place], [place] [address] D[place] of [place] Home Phone: [phone] Relation: Sister  Code Status:  Full Code  Goals of care: Advanced Directive information Advanced Directives 08/21/2020  Does Patient Have a Medical Advance Directive? No  Type of Advance Directive -  Does patient want to make changes to medical advance directive? No - Patient declined  Would patient like information on creating a medical advance directive? -     Chief Complaint  Patient presents with   Acute Visit    High Blood Sugars.    HPI:  Pt is a 79 y.o. male seen today for an acute visit for evaluation of critical Glucose level.He is seen in his room awake in bed.He denies any acute issues.Facility Nurse reports serum glucose level drawn today in the morning was 423 currently on Novolog 70/30 insulin 8 units SQ twice daily with meals. He denies any signs of hypo/hyperglycemia. No CBG log for review of blood sugars.    Past Medical History:  Diagnosis Date   CRI (chronic renal insufficiency)    CVA (cerebral infarction) 1997   Right with residual LUE weakness   Diabetes mellitus type II 1992   HLD (hyperlipidemia) 10/1998   HTN (hypertension) 10/1998   Primary open angle glaucoma    [name]   RBBB    Stroke [facility])    Left sided weakness   Past Surgical History:  Procedure Laterality Date   CATARACT EXTRACTION W/PHACO  09/09/2012   Procedure: CATARACT EXTRACTION PHACO AND INTRAOCULAR LENS PLACEMENT (IOC);  Surgeon: [name], MD;  Location: [facility] OR;  Service:  Ophthalmology;  Laterality: Right;   CATARACT EXTRACTION W/PHACO Left 03/31/2013   Procedure: CATARACT EXTRACTION PHACO AND INTRAOCULAR LENS PLACEMENT (IOC);  Surgeon: [name], MD;  Location: [facility] OR;  Service: Ophthalmology;  Laterality: Left;   EYE SURGERY     HIP ARTHROPLASTY Left 08/07/2020   Procedure: ARTHROPLASTY BIPOLAR HIP (HEMIARTHROPLASTY);  Surgeon: [name], MD;  Location: [facility] ORS;  Service: Orthopedics;  Laterality: Left;   lipoma removal  1970's   Right shoulder   US ECHOCARDIOGRAPHY  03/2013   mod LVH, EF 60%, normal wall motion    Allergies  Allergen Reactions   Tape Rash    Burns    Allergies as of 08/21/2020      Reactions   Tape Rash   Burns      Medication List       Accurate as of August 21, 2020  4:15 PM. If you have any questions, ask your nurse or doctor.        acetaminophen 325 MG tablet Commonly known as: TYLENOL Take 1-2 tablets (325-650 mg total) by mouth every 6 (six) hours as needed for mild pain (pain score 1-3 or temp > 100.5).   aspirin 325 MG tablet Take 325 mg by mouth daily.   aspirin 325 MG EC tablet Take 1 tablet (325 mg total) by mouth 2 (two) times daily after a meal.   atorvastatin 40 MG tablet Commonly known as: LIPITOR Take 1 tablet (40 mg total)  by mouth daily.   bisacodyl 10 MG suppository Commonly known as: DULCOLAX Place 10 mg rectally as needed for moderate constipation.   brimonidine 0.1 % Soln Commonly known as: ALPHAGAN P Place 1 drop into both eyes 3 (three) times daily.   diclofenac sodium 1 % Gel Commonly known as: VOLTAREN Apply 1 application topically 3 (three) times daily as needed.   ferrous sulfate 325 (65 FE) MG tablet Take 325 mg by mouth daily.   insulin aspart protamine- aspart (70-30) 100 UNIT/ML injection Commonly known as: NOVOLOG MIX 70/30 Inject 0.08 mLs (8 Units total) into the skin 2 (two) times daily with a meal.   latanoprost 0.005 % ophthalmic solution Commonly known as:  XALATAN Place 1 drop into both eyes at bedtime.   magnesium hydroxide 400 MG/5ML suspension Commonly known as: MILK OF MAGNESIA Take by mouth daily as needed for mild constipation.   methocarbamol 500 MG tablet Commonly known as: ROBAXIN Take 1 tablet (500 mg total) by mouth every 6 (six) hours as needed for muscle spasms.   metoprolol tartrate 25 MG tablet Commonly known as: LOPRESSOR Take 1 tablet (25 mg total) by mouth 2 (two) times daily.   MULTIPLE VITAMIN PO Take 1 tablet by mouth daily.   Oxycodone HCl 10 MG Tabs Take 1-1.5 tablets (10-15 mg total) by mouth every 4 (four) hours as needed for severe pain (pain score 7-10). What changed: Another medication with the same name was removed. Continue taking this medication, and follow the directions you see here. Changed by: Caesar Bookman, NP   potassium chloride 10 MEQ tablet Commonly known as: KLOR-CON TAKE 3 TABLETS (30 MEQ TOTAL) BY MOUTH DAILY.   RA SALINE ENEMA RE Place rectally as needed.   Vyzulta 0.024 % Soln Generic drug: Latanoprostene Bunod Apply 1 drop to eye at bedtime.       Review of Systems  Constitutional: Negative for appetite change, chills, fatigue and fever.  HENT: Positive for voice change. Negative for congestion, postnasal drip, rhinorrhea, sinus pressure, sinus pain, sneezing and sore throat.        Hypophonia ,dysarthria   Respiratory: Negative for cough, chest tightness, shortness of breath and wheezing.   Cardiovascular: Negative for chest pain, palpitations and leg swelling.  Gastrointestinal: Negative for abdominal distention, abdominal pain, constipation, diarrhea, nausea and vomiting.  Endocrine: Negative for cold intolerance, heat intolerance, polydipsia, polyphagia and polyuria.  Genitourinary: Negative for urgency.       Foley cath   Musculoskeletal: Positive for gait problem. Negative for joint swelling and myalgias.  Skin: Negative for color change, pallor and rash.    Neurological: Positive for weakness and numbness. Negative for dizziness, speech difficulty, light-headedness and headaches.  Hematological: Does not bruise/bleed easily.  Psychiatric/Behavioral: Positive for confusion. Negative for agitation, behavioral problems and sleep disturbance. The patient is not nervous/anxious.     Immunization History  Administered Date(s) Administered   Fluad Quad(high Dose 65+) 08/02/2019   Influenza Split 09/01/2012   Influenza Whole 09/18/2002, 08/27/2007, 08/29/2008, 09/10/2010   Influenza,inj,Quad PF,6+ Mos 07/28/2013, 10/17/2014, 08/21/2015, 10/18/2016, 08/15/2017   Influenza-Unspecified 09/18/2018   Janssen (J&J) SARS-COV-2 Vaccination 02/24/2020   Pneumococcal Conjugate-13 06/10/2014   Pneumococcal Polysaccharide-23 08/18/1996, 06/19/2010   Td 10/31/1998, 01/26/2009   Zoster 09/01/2012   Pertinent  Health Maintenance Due  Topic Date Due   OPHTHALMOLOGY EXAM  01/07/2020   INFLUENZA VACCINE  06/18/2020   FOOT EXAM  08/01/2020   HEMOGLOBIN A1C  02/03/2021   PNA vac Low Risk  Adult  Completed   Fall Risk  05/01/2020 05/01/2020 04/23/2019 04/20/2018 04/25/2017  Falls in the past year? 0 0 0 Yes Yes  Number falls in past yr: 0 0 - 2 or more 2 or more  Injury with Fall? 0 0 - Yes No  Risk Factor Category  - - - High Fall Risk High Fall Risk  Risk for fall due to : - Impaired balance/gait;Impaired mobility - - Impaired balance/gait;Impaired mobility  Follow up - Falls evaluation completed - - Falls prevention discussed   Functional Status Survey:    Vitals:   08/21/20 1553  BP: (!) 161/46  Pulse: 88  Resp: 18  Temp: (!) 97.3 F (36.3 C)  Weight: 171 lb (77.6 kg)  Height: 5' 11.5" (1.816 m)   Body mass index is 23.52 kg/m. Physical Exam Vitals and nursing note reviewed.  Constitutional:      General: He is not in acute distress.    Appearance: He is normal weight. He is not ill-appearing.  HENT:     Head: Normocephalic.      Nose: Nose normal. No congestion or rhinorrhea.     Mouth/Throat:     Mouth: Mucous membranes are moist.     Pharynx: Oropharynx is clear. No oropharyngeal exudate or posterior oropharyngeal erythema.  Eyes:     General: No scleral icterus.       Right eye: No discharge.        Left eye: No discharge.     Conjunctiva/sclera: Conjunctivae normal.     Pupils: Pupils are equal, round, and reactive to light.  Cardiovascular:     Rate and Rhythm: Normal rate and regular rhythm.     Pulses: Normal pulses.     Heart sounds: Normal heart sounds. No murmur heard.  No friction rub. No gallop.   Pulmonary:     Effort: Pulmonary effort is normal. No respiratory distress.     Breath sounds: Normal breath sounds. No wheezing, rhonchi or rales.  Chest:     Chest wall: No tenderness.  Abdominal:     General: Bowel sounds are normal. There is no distension.     Palpations: Abdomen is soft. There is no mass.     Tenderness: There is no abdominal tenderness. There is no right CVA tenderness, left CVA tenderness, guarding or rebound.  Musculoskeletal:        General: No swelling or tenderness.     Cervical back: Normal range of motion. No rigidity or tenderness.     Right lower leg: No edema.     Left lower leg: No edema.     Comments: Unsteady gait   Lymphadenopathy:     Cervical: No cervical adenopathy.  Skin:    General: Skin is warm and dry.     Coloration: Skin is not pale.     Findings: No bruising, erythema or rash.  Neurological:     Mental Status: He is alert.     Sensory: Sensory deficit present.     Motor: Weakness present.     Gait: Gait abnormal.  Psychiatric:        Mood and Affect: Mood normal.        Behavior: Behavior normal.        Thought Content: Thought content normal.        Cognition and Memory: Memory is impaired.     Comments: Dysarthria,hypophonia     Labs reviewed: Recent Labs    08/07/20 1918 08/08/20 0026 08/09/20 0246 08/09/20  0246 08/10/20 [id number]  08/10/20 [id number] 08/11/20 0553 08/11/20 0553 08/12/20 0540 08/13/20 0548 08/21/20 0000  NA 153*   < > 144   < > 149*   < > 146*   < > 146* 142 138  K 4.5   < > 3.3*   < > 3.4*   < > 3.7   < > 3.8 3.9 4.7  CL 109   < > 106   < > 110   < > 108   < > 108 110 102  CO2 25   < > 27   < > 27   < > 26   < > 26 22 25*  GLUCOSE 108*   < > 174*   < > 60*   < > 171*  --  221* 161*  --   BUN 28*   < > 18   < > 26*   < > 25*   < > 25* 24* 10  CREATININE 1.26*   < > 1.21   < > 1.40*   < > 1.33*   < > 1.34* 1.04 0.8  CALCIUM 8.9   < > 7.8*   < > 8.0*   < > 7.7*   < > 7.9* 7.6* 8.5*  MG 2.6*  --  2.2  --  2.6*  --   --   --   --   --   --    < > = values in this interval not displayed.   Recent Labs    08/10/20 0452 08/10/20 0452 08/11/20 0553 08/12/20 0540 08/21/20 0000  AST 89*   < > 129* 109* 23  ALT 36   < > 52* 54* 21  ALKPHOS 116  --  139* 146*  --   BILITOT 1.4*  --  1.2 1.1  --   PROT 6.5  --  6.3* 6.4*  --   ALBUMIN 2.3*  --  2.2* 2.1*  --    < > = values in this interval not displayed.   Recent Labs    08/11/20 0553 08/11/20 0553 08/12/20 0540 08/13/20 0548 08/21/20 0000  WBC 10.4   < > 10.1 10.7* 9.7  NEUTROABS 7.1  --  7.0 7.8*  --   HGB 11.9*   < > 11.3* 11.6* 11.8*  HCT 34.3*   < > 33.1* 33.2* 36*  MCV 73.8*  --  74.0* 73.5*  --   PLT 165   < > 193 230 435*   < > = values in this interval not displayed.   Lab Results  Component Value Date   TSH 1.369 08/07/2020   Lab Results  Component Value Date   HGBA1C 7.7 (H) 08/06/2020   Lab Results  Component Value Date   CHOL 104 05/01/2020   HDL 51.80 05/01/2020   LDLCALC 43 05/01/2020   LDLDIRECT 49.7 09/01/2012   TRIG 48.0 05/01/2020   CHOLHDL 2 05/01/2020    Significant Diagnostic Results in last 30 days:  DG Chest 1 View  Result Date: 08/06/2020 CLINICAL DATA:  Left leg pain related to fall. EXAM: CHEST  1 VIEW COMPARISON:  October 30, 2013 FINDINGS: The heart, hila, and mediastinum are normal. No  pneumothorax. Minimal atelectasis in the left base. The lungs are otherwise clear. No other acute abnormalities. IMPRESSION: No active disease. Electronically Signed   By: Gerome Samavid  Williams III M.D   On: 08/06/2020 18:08   DG Pelvis 1-2 Views  Result Date: 08/06/2020 CLINICAL DATA:  Pain after fall EXAM: PELVIS - 1-2 VIEW COMPARISON:  None. FINDINGS: A subcapital fracture seen to the left hip. Evaluation for dislocation is limited as only an AP view was obtained. No definite dislocation noted. No other bony abnormalities are identified. IMPRESSION: Subcapital fracture through the proximal left hip. Evaluation for dislocation is limited without a lateral view but there is no evidence of dislocation on this study. Electronically Signed   By: Gerome Sam III M.D   On: 08/06/2020 18:09   DG Elbow Complete Left  Result Date: 08/06/2020 CLINICAL DATA:  Pain after fall EXAM: LEFT ELBOW - COMPLETE 3+ VIEW COMPARISON:  None. FINDINGS: Evaluation is limited due to positioning. The proximal ulna and radius are not well visualized on the first 3 images as a overlap the humerus. No joint effusion is seen on the lateral view. Mild irregularity of the radial head on the lateral view suggest the possibility of a subtle fracture. No other abnormalities. IMPRESSION: Evaluation is limited due to positioning as above. Irregularity of the radial head on the lateral view is concerning for a subtle fracture. No joint effusion identified. Electronically Signed   By: Gerome Sam III M.D   On: 08/06/2020 18:07   CT Head Wo Contrast  Result Date: 08/06/2020 CLINICAL DATA:  Status post fall. EXAM: CT HEAD WITHOUT CONTRAST TECHNIQUE: Contiguous axial images were obtained from the base of the skull through the vertex without intravenous contrast. COMPARISON:  October 30, 2013 FINDINGS: Brain: There is mild to moderate severity cerebral atrophy with widening of the extra-axial spaces and ventricular dilatation. There are areas  of decreased attenuation within the white matter tracts of the supratentorial brain, consistent with microvascular disease changes. A large area of cortical encephalomalacia, with adjacent chronic white matter low attenuation, is seen throughout the right hemisphere. This involves predominantly the right parietal lobe and is unchanged in appearance when compared to the prior study. Vascular: No hyperdense vessel or unexpected calcification. Skull: Normal. Negative for fracture or focal lesion. Sinuses/Orbits: No acute finding. Other: None. IMPRESSION: 1. No acute intracranial abnormality. 2. Generalized cerebral atrophy. 3. Chronic predominant right parietal lobe infarct. Electronically Signed   By: Aram Candela M.D.   On: 08/06/2020 19:00   CT Cervical Spine Wo Contrast  Result Date: 08/06/2020 CLINICAL DATA:  Left leg pain related to fall EXAM: CT CERVICAL SPINE WITHOUT CONTRAST TECHNIQUE: Multidetector CT imaging of the cervical spine was performed without intravenous contrast. Multiplanar CT image reconstructions were also generated. COMPARISON:  None. FINDINGS: Alignment: There is straightening of the normal cervical lordosis. Skull base and vertebrae: Visualized skull base is intact. No atlanto-occipital dissociation. The vertebral body heights are well maintained. No fracture or pathologic osseous lesion seen. Soft tissues and spinal canal: The visualized paraspinal soft tissues are unremarkable. No prevertebral soft tissue swelling is seen. The spinal canal is grossly unremarkable, no large epidural collection or significant canal narrowing. Disc levels: Multilevel cervical spine spondylosis is seen with anterior osteophytes, disc osteophyte complex and uncovertebral osteophytes most notable C5-C6 and C6-C7 with moderate neural foraminal narrowing and mild central canal stenosis. Upper chest: The lung apices are clear. Thoracic inlet is within normal limits. Other: None IMPRESSION: No acute fracture  or malalignment of the spine. 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 Electronically Signed   By: Jonna Clark M.D.   On: 08/06/2020 19:25   Pelvis Portable  Result Date: 08/07/2020 CLINICAL DATA:  Status post left hip replacement EXAM: PORTABLE PELVIS 1 VIEWS  COMPARISON:  08/06/2020 FINDINGS: Left hip prosthesis is now seen. No acute fracture or dislocation is seen. No soft tissue abnormality is noted. IMPRESSION: Status post left hip replacement Electronically Signed   By: Alcide Clever M.D.   On: 08/07/2020 19:42   DG CHEST PORT 1 VIEW  Result Date: 08/09/2020 CLINICAL DATA:  Hyperglycemia EXAM: PORTABLE CHEST 1 VIE COMPARISON:  August 06, 2020 FINDINGS: Lungs are clear. Heart size and pulmonary vascularity are normal. No adenopathy. No bone lesions. IMPRESSION: Lungs clear. Cardiac silhouette within normal limits. No adenopathy. Electronically Signed   By: Bretta Bang III M.D.   On: 08/09/2020 14:35   DG Shoulder Left  Addendum Date: 08/06/2020   ADDENDUM REPORT: 08/06/2020 18:05 ADDENDUM: Upon reviewing the left humeral images, there appears to be a subtle a fracture through the neck of the left humerus only seen on the true AP view. This finding was described on the humeral images. Electronically Signed   By: Gerome Sam III M.D   On: 08/06/2020 18:05   Result Date: 08/06/2020 CLINICAL DATA:  Pain after fall EXAM: LEFT SHOULDER - 2+ VIEW COMPARISON:  None. FINDINGS: Irregularity along the inferior aspect of the glenoid suggest the possibility of previous trauma. No acute fractures or dislocations are seen on today's study. Limited views of the chest are normal. IMPRESSION: Negative. Electronically Signed: By: Gerome Sam III M.D On: 08/06/2020 18:01   DG Humerus Left  Result Date: 08/06/2020 CLINICAL DATA:  Pain after fall EXAM: LEFT HUMERUS - 2+ VIEW COMPARISON:  None. FINDINGS: There is a subtle lucency extending through the neck of the left humerus only  seen on the final image. In retrospect, this is also seen on 1 of the images of the left shoulder but is very subtle. No other fractures. IMPRESSION: Subtle nondisplaced fracture through the neck of the humerus only seen on 1 image of the humeral films and 1 image of the left shoulder films. The finding is subtle but appears to be real. Electronically Signed   By: Gerome Sam III M.D   On: 08/06/2020 18:04   DG Femur Min 2 Views Left  Result Date: 08/06/2020 CLINICAL DATA:  Pain after fall. EXAM: LEFT FEMUR 2 VIEWS COMPARISON:  None. FINDINGS: Only the distal half of the femur was imaged on the lateral view. Only the distal third of the femur was imaged on the AP view. The proximal half of the femur was imaged on the lateral view. Given the lack of visualization of the entire femur in all three views, evaluation is limited. Within these limitations, no fractures are seen. IMPRESSION: Only a portion of the femur was seen in each of the three views which limits evaluation. Within these limitations, no fractures are seen. Electronically Signed   By: Gerome Sam III M.D   On: 08/06/2020 18:00   ECHOCARDIOGRAM LIMITED  Result Date: 08/08/2020    ECHOCARDIOGRAM LIMITED REPORT   Patient Name:   Marcus Downs. Date of Exam: 08/08/2020 Medical Rec #:  [id number]         Height:       71.5 in Accession #:    [id number]        Weight:       172.0 lb Date of Birth:  1941-07-06          BSA:          1.988 m Patient Age:    47 years  BP:           1126/89 mmHg Patient Gender: M                 HR:           130 bpm. Exam Location:  Inpatient Procedure: 2D Echo Indications:    Abnormal EKG  History:        Patient has prior history of Echocardiogram examinations, most                 recent 03/26/2013. Stroke, Arrythmias:Atrial Fibrillation and                 Tachycardia; Risk Factors:Diabetes and Hypertension. S/p Hip                 surgery.  Sonographer:    Lavenia Atlas Referring Phys: [id number]  Alessandra Bevels IMPRESSIONS  1. There is incessant arrhythmia with varying QRS morphology (appears to be atrial fibrillation with frequent PVCs), that makes assessment of regional and global left ventricular function more challenging. Left ventricular ejection fraction, by estimation, is 55 to 60%. The left ventricle has normal function. The left ventricle has no regional wall motion abnormalities. Left ventricular diastolic function could not be evaluated.  2. Right ventricular systolic function is normal. The right ventricular size is normal. Tricuspid regurgitation signal is inadequate for assessing PA pressure.  3. The mitral valve is normal in structure. Mild mitral valve regurgitation.  4. The aortic valve is grossly normal. There is mild calcification of the aortic valve. Aortic valve regurgitation is not visualized. Mild aortic valve sclerosis is present, with no evidence of aortic valve stenosis. FINDINGS  Left Ventricle: There is incessant arrhythmia with varying QRS morphology (appears to be atrial fibrillation with frequent PVCs), that makes assessment of regional and global left ventricular function more challenging. Left ventricular ejection fraction, by estimation, is 55 to 60%. The left ventricle has normal function. The left ventricle has no regional wall motion abnormalities. There is no left ventricular hypertrophy. Left ventricular diastolic function could not be evaluated. Left ventricular diastolic function could not be evaluated due to atrial fibrillation. Right Ventricle: The right ventricular size is normal. No increase in right ventricular wall thickness. Right ventricular systolic function is normal. Tricuspid regurgitation signal is inadequate for assessing PA pressure. Right Atrium: Right atrial size was normal in size. Pericardium: There is no evidence of pericardial effusion. Mitral Valve: The mitral valve is normal in structure. Mild mitral valve regurgitation. Tricuspid Valve: The  tricuspid valve is grossly normal. Tricuspid valve regurgitation is not demonstrated. Aortic Valve: The aortic valve is grossly normal. There is mild calcification of the aortic valve. Aortic valve regurgitation is not visualized. Mild aortic valve sclerosis is present, with no evidence of aortic valve stenosis. Pulmonic Valve: The pulmonic valve was not well visualized. Pulmonic valve regurgitation is not visualized. Aorta: The aortic root and ascending aorta are structurally normal, with no evidence of dilitation. Venous: The inferior vena cava was not well visualized. IAS/Shunts: The interatrial septum was not assessed. Rachelle Hora Croitoru MD Electronically signed by Thurmon Fair MD Signature Date/Time: 08/08/2020/3:12:26 PM    Final     Assessment/Plan   Type 2 diabetes mellitus with vascular disease (HCC) Lab Results  Component Value Date   HGBA1C 7.7 (H) 08/06/2020   CBG in the 400's -500's currently on Novolog mix 70/30 injection 8 units twice daily will increase to 10 units SQ twice daily daily. - continue to monitor CBG -  Facility Registered Dietician to evaluate diet though nurse reports family brings food sometimes to patient.  - on ASA and Statin     Family/ staff Communication: Reviewed with patient and facility Nurse   Labs/tests ordered:  None

## 2020-08-22 ENCOUNTER — Other Ambulatory Visit: Payer: Self-pay | Admitting: *Deleted

## 2020-08-22 DIAGNOSIS — I69391 Dysphagia following cerebral infarction: Secondary | ICD-10-CM | POA: Insufficient documentation

## 2020-08-22 DIAGNOSIS — R339 Retention of urine, unspecified: Secondary | ICD-10-CM | POA: Insufficient documentation

## 2020-08-22 NOTE — Patient Outreach (Signed)
Member screened for potential [facility] Care Management needs as a benefit of [employer].  Per Patient Ilda Foil member resides in [place].   Communication sent to SW to collaborate about anticipated dc plans and potential [facility] Care Management needs.  Will continue to follow for transition plans.    Raiford Noble, MSN-Ed, RN,BSN [facility] Post Acute Care Coordinator [phone] [facility]) [phone]  (Toll free office)

## 2020-08-22 NOTE — Patient Outreach (Signed)
[facility] Post- Acute Care Coordinator follow up. Member screened for potential [facility] Care Management needs as a benefit of [employer].  Update received from [facility] SW indicating member's transition plan is to return home.   Will continue to follow while member resides in SNF and plan outreach as appropriate.   Raiford Noble, MSN-Ed, RN,BSN [facility] Post Acute Care Coordinator [phone] [facility]) [phone]  (Toll free office)

## 2020-08-28 ENCOUNTER — Telehealth: Payer: Self-pay | Admitting: Adult Health

## 2020-08-28 NOTE — Telephone Encounter (Signed)
I was notified by the nurse at [facility] that Marcus Downs blood sugar was 470.  I ordered 5 units of Novolog but there was minimal change on the recheck. I ordered 10 units of Novolog now an to increased the Novolog 70/30 mix to 12 units bid. The nurse reports that he is in his usual state of health and is alert and eating and drinking adequately. Will forward to his provider at [facility] for further review. The nurse says his sugars have been running high at the facility.

## 2020-08-29 ENCOUNTER — Non-Acute Institutional Stay (SKILLED_NURSING_FACILITY): Payer: Medicare Other | Admitting: Internal Medicine

## 2020-08-29 ENCOUNTER — Encounter: Payer: Self-pay | Admitting: Internal Medicine

## 2020-08-29 DIAGNOSIS — E1165 Type 2 diabetes mellitus with hyperglycemia: Secondary | ICD-10-CM | POA: Diagnosis not present

## 2020-08-29 DIAGNOSIS — M21372 Foot drop, left foot: Secondary | ICD-10-CM | POA: Diagnosis not present

## 2020-08-29 DIAGNOSIS — S72002A Fracture of unspecified part of neck of left femur, initial encounter for closed fracture: Secondary | ICD-10-CM | POA: Diagnosis not present

## 2020-08-29 DIAGNOSIS — Z794 Long term (current) use of insulin: Secondary | ICD-10-CM

## 2020-08-29 DIAGNOSIS — S42295A Other nondisplaced fracture of upper end of left humerus, initial encounter for closed fracture: Secondary | ICD-10-CM | POA: Diagnosis not present

## 2020-08-29 MED ORDER — INSULIN ASPART PROT & ASPART (70-30 MIX) 100 UNIT/ML PEN: PEN_INJECTOR | SUBCUTANEOUS | 11 refills | Status: DC

## 2020-08-29 NOTE — Telephone Encounter (Signed)
Thanks, apparently this is a trend so I will see him today and make some changes.

## 2020-08-29 NOTE — Progress Notes (Signed)
Location:  [facility] Room Number: 08/14/2020 Place of Service:  SNF (31) Provider:  Malaka Ruffner L. Renato Gails, D.O., C.M.D.  [name], MD  Patient Care Team: [name], MD as PCP - General [name], MD as PCP - Cardiology (Cardiology) [name], MD as Consulting Physician (Ophthalmology)  Extended Emergency Contact Information Primary Emergency Contact: [place] Address: [address]          [place], [place] [address] Darden [place] of [place] Home Phone: [phone] Relation: Sister  Code Status:  FULL Goals of care: Advanced Directive information Advanced Directives 08/29/2020  Does Patient Have a Medical Advance Directive? No  Type of Advance Directive -  Does patient want to make changes to medical advance directive? No - Patient declined  Would patient like information on creating a medical advance directive? -     Chief Complaint  Patient presents with  . Acute Visit    High Blood sugar     HPI:  Pt is a 79 y.o. male with h/o DMII with PVD, htn, hyperlipidemia, prior stroke with left-sided weakness and dysarthria  here for short-term rehab s/p hospitalization for fall with left hip fx and mild DKA seen today for an acute visit for hyperglycemia.  His CBGs have been running up in the 400s in the evenings and nursing has been calling on call for additional insulin.  I was asked on site last week to give additional insulin, as well.    Full CBGs reviewed in facility EMR:   Am CBGs taken from 757am to 907am range from 200 to 403 since 10/6 Mid-day CBGs taken from 1226 to 251pm range from 237 to 355 Evening CBGs done 410-122pm range from 240 to 337  HS CBGs done 10-11pm range 214 to 334 Current insulin regimen is novolog mix 70/30 oddly now tid with meals, cbg order not visible in orders.    Nursing notes reviewed.  Pt sometimes refuses CBGs hence absence of evening and hs readings.  He admits he loves sweets and  has been eating well.  He is much more alert when seen this morning before he went off to his ortho appt.  Intake less in mornings and better throughout day.  Has med pass and refuses prostat.  Has wounds and two fractures--hip and elbow.    He reports he's had chronic edema of his left leg for 20 some years.  He's had a stroke on that side and has a foot drop. Normally, he uses a brace to help him ambulate on that side.    Past Medical History:  Diagnosis Date  . CRI (chronic renal insufficiency)   . 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 Diabetes mellitus type II 1992  . HLD (hyperlipidemia) 10/1998  . HTN (hypertension) 10/1998  . Primary open angle glaucoma    [name]  . RBBB   . Stroke [facility])    Left sided weakness   Past Surgical History:  Procedure Laterality Date  . CATARACT EXTRACTION W/PHACO  09/09/2012   Procedure: CATARACT EXTRACTION PHACO AND INTRAOCULAR LENS PLACEMENT (IOC);  Surgeon: [name], MD;  Location: [facility] OR;  Service: Ophthalmology;  Laterality: Right;  . CATARACT EXTRACTION W/PHACO Left 03/31/2013   Procedure: CATARACT EXTRACTION PHACO AND INTRAOCULAR LENS PLACEMENT (IOC);  Surgeon: [name], MD;  Location: [facility] OR;  Service: Ophthalmology;  Laterality: Left;  . EYE SURGERY    . HIP ARTHROPLASTY Left 08/07/2020   Procedure:  ARTHROPLASTY BIPOLAR HIP (HEMIARTHROPLASTY);  Surgeon: [name], MD;  Location: [facility] ORS;  Service: Orthopedics;  Laterality: Left;  . lipoma removal  1970's   Right shoulder  . US ECHOCARDIOGRAPHY  03/2013   mod LVH, EF 60%, normal wall motion    Allergies  Allergen Reactions  . Tape Rash    Burns    Outpatient Encounter Medications as of 08/29/2020  Medication Sig  . acetaminophen (TYLENOL) 325 MG tablet Take 1-2 tablets (325-650 mg total) by mouth every 6 (six) hours as needed for mild pain (pain score 1-3 or temp > 100.5).  Marland Kitchen aspirin EC 325 MG EC tablet Take 1 tablet (325 mg total) by mouth 2  (two) times daily after a meal.  . atorvastatin (LIPITOR) 40 MG tablet Take 1 tablet (40 mg total) by mouth daily.  . bisacodyl (DULCOLAX) 10 MG suppository Place 10 mg rectally as needed for moderate constipation.  . brimonidine (ALPHAGAN P) 0.1 % SOLN Place 1 drop into both eyes 3 (three) times daily.  . diclofenac sodium (VOLTAREN) 1 % GEL Apply 1 application topically 3 (three) times daily as needed.  . ferrous sulfate 325 (65 FE) MG tablet Take 325 mg by mouth daily.   . insulin aspart protamine- aspart (NOVOLOG MIX 70/30) (70-30) 100 UNIT/ML injection Inject 12 Units into the skin 3 (three) times daily with meals.  . latanoprost (XALATAN) 0.005 % ophthalmic solution Place 1 drop into both eyes at bedtime.  . Latanoprostene Bunod (VYZULTA) 0.024 % SOLN Apply 1 drop to eye at bedtime.   . magnesium hydroxide (MILK OF MAGNESIA) 400 MG/5ML suspension Take by mouth daily as needed for mild constipation.  . metoprolol tartrate (LOPRESSOR) 25 MG tablet Take 1 tablet (25 mg total) by mouth 2 (two) times daily.  . MULTIPLE VITAMIN PO Take 1 tablet by mouth daily.  . potassium chloride (KLOR-CON) 10 MEQ tablet TAKE 3 TABLETS (30 MEQ TOTAL) BY MOUTH DAILY.  Marland Kitchen Sodium Phosphates (RA SALINE ENEMA RE) Place rectally as needed.  . [DISCONTINUED] aspirin 325 MG tablet Take 325 mg by mouth daily.  . [DISCONTINUED] insulin aspart protamine- aspart (NOVOLOG MIX 70/30) (70-30) 100 UNIT/ML injection Inject 0.08 mLs (8 Units total) into the skin 2 (two) times daily with a meal. (Patient taking differently: Inject 12 Units into the skin 2 (two) times daily with a meal. )  . [DISCONTINUED] methocarbamol (ROBAXIN) 500 MG tablet Take 1 tablet (500 mg total) by mouth every 6 (six) hours as needed for muscle spasms.  . [DISCONTINUED] oxyCODONE 10 MG TABS Take 1-1.5 tablets (10-15 mg total) by mouth every 4 (four) hours as needed for severe pain (pain score 7-10).   No facility-administered encounter medications on file  as of 08/29/2020.    Review of Systems  Constitutional: Negative for chills and fever.  Eyes:       Glasses  Respiratory: Negative for shortness of breath.   Cardiovascular: Positive for leg swelling. Negative for chest pain.       Left leg chronic  Gastrointestinal: Negative for abdominal pain and constipation.  Genitourinary: Negative for dysuria.       Foley with dark yellow urine in bag  Musculoskeletal: Positive for joint pain. Negative for falls.       Left elbow and hip; elbow in sling  Skin: Negative for itching and rash.       Pressure injuries noted in nursing notes (sitting up in wheelchair prior to ortho appt so could not view myself today)  Neurological: Positive for sensory change, speech change and focal weakness. Negative for dizziness and loss of consciousness.       Left hemiparesis, foot drop, dysarthria and dysphagia from stroke  Psychiatric/Behavioral: Positive for memory loss.    Immunization History  Administered Date(s) Administered  . Fluad Quad(high Dose 65+) 08/02/2019  . Influenza Split 09/01/2012  . Influenza Whole 09/18/2002, 08/27/2007, 08/29/2008, 09/10/2010  . Influenza,inj,Quad PF,6+ Mos 07/28/2013, 10/17/2014, 08/21/2015, 10/18/2016, 08/15/2017  . Influenza-Unspecified 09/18/2018  . Janssen (J&J) SARS-COV-2 Vaccination 02/24/2020  . Pneumococcal Conjugate-13 06/10/2014  . Pneumococcal Polysaccharide-23 08/18/1996, 06/19/2010  . Td 10/31/1998, 01/26/2009  . Zoster 09/01/2012   Pertinent  Health Maintenance Due  Topic Date Due  . OPHTHALMOLOGY EXAM  01/07/2020  . INFLUENZA VACCINE  06/18/2020  . FOOT EXAM  08/01/2020  . HEMOGLOBIN A1C  02/03/2021  . PNA vac Low Risk Adult  Completed   Fall Risk  05/01/2020 05/01/2020 04/23/2019 04/20/2018 04/25/2017  Falls in the past year? 0 0 0 Yes Yes  Number falls in past yr: 0 0 - 2 or more 2 or more  Injury with Fall? 0 0 - Yes No  Risk Factor Category  - - - High Fall Risk High Fall Risk  Risk for fall  due to : - Impaired balance/gait;Impaired mobility - - Impaired balance/gait;Impaired mobility  Follow up - Falls evaluation completed - - Falls prevention discussed   Functional Status Survey:    Vitals:   08/29/20 1006  BP: 136/70  Pulse: 72  Temp: 97.6 F (36.4 C)  Weight: 171 lb (77.6 kg)  Height:  (1.803 m)   Body mass index is 23.85 kg/m. Physical Exam Vitals reviewed.  Constitutional:      General: He is not in acute distress.    Appearance: He is not toxic-appearing.  HENT:     Head: Normocephalic and atraumatic.     Right Ear: External ear normal.     Left Ear: External ear normal.     Nose: Nose normal.     Mouth/Throat:     Pharynx: Oropharynx is clear.  Eyes:     Conjunctiva/sclera: Conjunctivae normal.     Pupils: Pupils are equal, round, and reactive to light.     Comments: glasses  Cardiovascular:     Rate and Rhythm: Normal rate and regular rhythm.  Pulmonary:     Effort: Pulmonary effort is normal.     Breath sounds: Normal breath sounds. No wheezing, rhonchi or rales.  Abdominal:     General: Bowel sounds are normal. There is no distension.     Tenderness: There is no abdominal tenderness.  Genitourinary:    Comments: Foley draining dark yellow urine Musculoskeletal:     Cervical back: Neck supple.     Comments: Left arm in sling, sitting up in wheelchair, 2+ edema left leg, none on right  Lymphadenopathy:     Cervical: No cervical adenopathy.  Neurological:     Mental Status: He is alert.     Comments: Left hemiparesis; more alert and speech clearer than upon arrival  Psychiatric:        Mood and Affect: Mood normal.     Labs reviewed: Recent Labs    08/07/20 1918 08/08/20 0026 08/09/20 0246 08/09/20 0246 08/10/20 0452 08/10/20 0452 08/11/20 0553 08/11/20 0553 08/12/20 0540 08/13/20 0548 08/21/20 0000  NA 153*   < > 144   < > 149*   < > 146*   < > 146* 142 138  K 4.5   < > 3.3*   < > 3.4*   < > 3.7   < > 3.8 3.9 4.7  CL  109   < > 106   < > 110   < > 108   < > 108 110 102  CO2 25   < > 27   < > 27   < > 26   < > 26 22 25*  GLUCOSE 108*   < > 174*   < > 60*   < > 171*  --  221* 161*  --   BUN 28*   < > 18   < > 26*   < > 25*   < > 25* 24* 10  CREATININE 1.26*   < > 1.21   < > 1.40*   < > 1.33*   < > 1.34* 1.04 0.8  CALCIUM 8.9   < > 7.8*   < > 8.0*   < > 7.7*   < > 7.9* 7.6* 8.5*  MG 2.6*  --  2.2  --  2.6*  --   --   --   --   --   --    < > = values in this interval not displayed.   Recent Labs    08/10/20 0452 08/10/20 0452 08/11/20 0553 08/12/20 0540 08/21/20 0000  AST 89*   < > 129* 109* 23  ALT 36   < > 52* 54* 21  ALKPHOS 116  --  139* 146*  --   BILITOT 1.4*  --  1.2 1.1  --   PROT 6.5  --  6.3* 6.4*  --   ALBUMIN 2.3*  --  2.2* 2.1*  --    < > = values in this interval not displayed.   Recent Labs    08/11/20 0553 08/11/20 0553 08/12/20 0540 08/13/20 0548 08/21/20 0000  WBC 10.4   < > 10.1 10.7* 9.7  NEUTROABS 7.1  --  7.0 7.8*  --   HGB 11.9*   < > 11.3* 11.6* 11.8*  HCT 34.3*   < > 33.1* 33.2* 36*  MCV 73.8*  --  74.0* 73.5*  --   PLT 165   < > 193 230 435*   < > = values in this interval not displayed.   Lab Results  Component Value Date   TSH 1.369 08/07/2020   Lab Results  Component Value Date   HGBA1C 7.7 (H) 08/06/2020   Lab Results  Component Value Date   CHOL 104 05/01/2020   HDL 51.80 05/01/2020   LDLCALC 43 05/01/2020   LDLDIRECT 49.7 09/01/2012   TRIG 48.0 05/01/2020   CHOLHDL 2 05/01/2020    Significant Diagnostic Results in last 30 days:  DG Chest 1 View  Result Date: 08/06/2020 CLINICAL DATA:  Left leg pain related to fall. EXAM: CHEST  1 VIEW COMPARISON:  October 30, 2013 FINDINGS: The heart, hila, and mediastinum are normal. No pneumothorax. Minimal atelectasis in the left base. The lungs are otherwise clear. No other acute abnormalities. IMPRESSION: No active disease. Electronically Signed   By: Gerome Sam III M.D   On: 08/06/2020 18:08    DG Pelvis 1-2 Views  Result Date: 08/06/2020 CLINICAL DATA:  Pain after fall EXAM: PELVIS - 1-2 VIEW COMPARISON:  None. FINDINGS: A subcapital fracture seen to the left hip. Evaluation for dislocation is limited as only an AP view was obtained. No definite dislocation noted. No other bony abnormalities are  identified. IMPRESSION: Subcapital fracture through the proximal left hip. Evaluation for dislocation is limited without a lateral view but there is no evidence of dislocation on this study. Electronically Signed   By: Gerome Sam III M.D   On: 08/06/2020 18:09   DG Elbow Complete Left  Result Date: 08/06/2020 CLINICAL DATA:  Pain after fall EXAM: LEFT ELBOW - COMPLETE 3+ VIEW COMPARISON:  None. FINDINGS: Evaluation is limited due to positioning. The proximal ulna and radius are not well visualized on the first 3 images as a overlap the humerus. No joint effusion is seen on the lateral view. Mild irregularity of the radial head on the lateral view suggest the possibility of a subtle fracture. No other abnormalities. IMPRESSION: Evaluation is limited due to positioning as above. Irregularity of the radial head on the lateral view is concerning for a subtle fracture. No joint effusion identified. Electronically Signed   By: Gerome Sam III M.D   On: 08/06/2020 18:07   CT Head Wo Contrast  Result Date: 08/06/2020 CLINICAL DATA:  Status post fall. EXAM: CT HEAD WITHOUT CONTRAST TECHNIQUE: Contiguous axial images were obtained from the base of the skull through the vertex without intravenous contrast. COMPARISON:  October 30, 2013 FINDINGS: Brain: There is mild to moderate severity cerebral atrophy with widening of the extra-axial spaces and ventricular dilatation. There are areas of decreased attenuation within the white matter tracts of the supratentorial brain, consistent with microvascular disease changes. A large area of cortical encephalomalacia, with adjacent chronic white matter low  attenuation, is seen throughout the right hemisphere. This involves predominantly the right parietal lobe and is unchanged in appearance when compared to the prior study. Vascular: No hyperdense vessel or unexpected calcification. Skull: Normal. Negative for fracture or focal lesion. Sinuses/Orbits: No acute finding. Other: None. IMPRESSION: 1. No acute intracranial abnormality. 2. Generalized cerebral atrophy. 3. Chronic predominant right parietal lobe infarct. Electronically Signed   By: Aram Candela M.D.   On: 08/06/2020 19:00   CT Cervical Spine Wo Contrast  Result Date: 08/06/2020 CLINICAL DATA:  Left leg pain related to fall EXAM: CT CERVICAL SPINE WITHOUT CONTRAST TECHNIQUE: Multidetector CT imaging of the cervical spine was performed without intravenous contrast. Multiplanar CT image reconstructions were also generated. COMPARISON:  None. FINDINGS: Alignment: There is straightening of the normal cervical lordosis. Skull base and vertebrae: Visualized skull base is intact. No atlanto-occipital dissociation. The vertebral body heights are well maintained. No fracture or pathologic osseous lesion seen. Soft tissues and spinal canal: The visualized paraspinal soft tissues are unremarkable. No prevertebral soft tissue swelling is seen. The spinal canal is grossly unremarkable, no large epidural collection or significant canal narrowing. Disc levels: Multilevel cervical spine spondylosis is seen with anterior osteophytes, disc osteophyte complex and uncovertebral osteophytes most notable C5-C6 and C6-C7 with moderate neural foraminal narrowing and mild central canal stenosis. Upper chest: The lung apices are clear. Thoracic inlet is within normal limits. Other: None IMPRESSION: No acute fracture or malalignment of the spine. 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 Electronically Signed   By: Jonna Clark M.D.   On: 08/06/2020 19:25   Pelvis Portable  Result Date:  08/07/2020 CLINICAL DATA:  Status post left hip replacement EXAM: PORTABLE PELVIS 1 VIEWS COMPARISON:  08/06/2020 FINDINGS: Left hip prosthesis is now seen. No acute fracture or dislocation is seen. No soft tissue abnormality is noted. IMPRESSION: Status post left hip replacement Electronically Signed   By: Alcide Clever M.D.   On:  08/07/2020 19:42   DG CHEST PORT 1 VIEW  Result Date: 08/09/2020 CLINICAL DATA:  Hyperglycemia EXAM: PORTABLE CHEST 1 VIE COMPARISON:  August 06, 2020 FINDINGS: Lungs are clear. Heart size and pulmonary vascularity are normal. No adenopathy. No bone lesions. IMPRESSION: Lungs clear. Cardiac silhouette within normal limits. No adenopathy. Electronically Signed   By: Bretta Bang III M.D.   On: 08/09/2020 14:35   DG Shoulder Left  Addendum Date: 08/06/2020   ADDENDUM REPORT: 08/06/2020 18:05 ADDENDUM: Upon reviewing the left humeral images, there appears to be a subtle a fracture through the neck of the left humerus only seen on the true AP view. This finding was described on the humeral images. Electronically Signed   By: Gerome Sam III M.D   On: 08/06/2020 18:05   Result Date: 08/06/2020 CLINICAL DATA:  Pain after fall EXAM: LEFT SHOULDER - 2+ VIEW COMPARISON:  None. FINDINGS: Irregularity along the inferior aspect of the glenoid suggest the possibility of previous trauma. No acute fractures or dislocations are seen on today's study. Limited views of the chest are normal. IMPRESSION: Negative. Electronically Signed: By: Gerome Sam III M.D On: 08/06/2020 18:01   DG Humerus Left  Result Date: 08/06/2020 CLINICAL DATA:  Pain after fall EXAM: LEFT HUMERUS - 2+ VIEW COMPARISON:  None. FINDINGS: There is a subtle lucency extending through the neck of the left humerus only seen on the final image. In retrospect, this is also seen on 1 of the images of the left shoulder but is very subtle. No other fractures. IMPRESSION: Subtle nondisplaced fracture through the neck  of the humerus only seen on 1 image of the humeral films and 1 image of the left shoulder films. The finding is subtle but appears to be real. Electronically Signed   By: Gerome Sam III M.D   On: 08/06/2020 18:04   DG Femur Min 2 Views Left  Result Date: 08/06/2020 CLINICAL DATA:  Pain after fall. EXAM: LEFT FEMUR 2 VIEWS COMPARISON:  None. FINDINGS: Only the distal half of the femur was imaged on the lateral view. Only the distal third of the femur was imaged on the AP view. The proximal half of the femur was imaged on the lateral view. Given the lack of visualization of the entire femur in all three views, evaluation is limited. Within these limitations, no fractures are seen. IMPRESSION: Only a portion of the femur was seen in each of the three views which limits evaluation. Within these limitations, no fractures are seen. Electronically Signed   By: Gerome Sam III M.D   On: 08/06/2020 18:00   ECHOCARDIOGRAM LIMITED  Result Date: 08/08/2020    ECHOCARDIOGRAM LIMITED REPORT   Patient Name:   Bernice Mullin. Date of Exam: 08/08/2020 Medical Rec #:  [id number]         Height:       71.5 in Accession #:    [id number]        Weight:       172.0 lb Date of Birth:  Jan 14, 1941          BSA:          1.988 m Patient Age:    79 years          BP:           1126/89 mmHg Patient Gender: M                 HR:  130 bpm. Exam Location:  Inpatient Procedure: 2D Echo Indications:    Abnormal EKG  History:        Patient has prior history of Echocardiogram examinations, most                 recent 03/26/2013. Stroke, Arrythmias:Atrial Fibrillation and                 Tachycardia; Risk Factors:Diabetes and Hypertension. S/p Hip                 surgery.  Sonographer:    Lavenia AtlasBrooke Strickland Referring Phys: [id number] Alessandra BevelsNEELIMA KAMINENI IMPRESSIONS  1. There is incessant arrhythmia with varying QRS morphology (appears to be atrial fibrillation with frequent PVCs), that makes assessment of regional and global left  ventricular function more challenging. Left ventricular ejection fraction, by estimation, is 55 to 60%. The left ventricle has normal function. The left ventricle has no regional wall motion abnormalities. Left ventricular diastolic function could not be evaluated.  2. Right ventricular systolic function is normal. The right ventricular size is normal. Tricuspid regurgitation signal is inadequate for assessing PA pressure.  3. The mitral valve is normal in structure. Mild mitral valve regurgitation.  4. The aortic valve is grossly normal. There is mild calcification of the aortic valve. Aortic valve regurgitation is not visualized. Mild aortic valve sclerosis is present, with no evidence of aortic valve stenosis. FINDINGS  Left Ventricle: There is incessant arrhythmia with varying QRS morphology (appears to be atrial fibrillation with frequent PVCs), that makes assessment of regional and global left ventricular function more challenging. Left ventricular ejection fraction, by estimation, is 55 to 60%. The left ventricle has normal function. The left ventricle has no regional wall motion abnormalities. There is no left ventricular hypertrophy. Left ventricular diastolic function could not be evaluated. Left ventricular diastolic function could not be evaluated due to atrial fibrillation. Right Ventricle: The right ventricular size is normal. No increase in right ventricular wall thickness. Right ventricular systolic function is normal. Tricuspid regurgitation signal is inadequate for assessing PA pressure. Right Atrium: Right atrial size was normal in size. Pericardium: There is no evidence of pericardial effusion. Mitral Valve: The mitral valve is normal in structure. Mild mitral valve regurgitation. Tricuspid Valve: The tricuspid valve is grossly normal. Tricuspid valve regurgitation is not demonstrated. Aortic Valve: The aortic valve is grossly normal. There is mild calcification of the aortic valve. Aortic valve  regurgitation is not visualized. Mild aortic valve sclerosis is present, with no evidence of aortic valve stenosis. Pulmonic Valve: The pulmonic valve was not well visualized. Pulmonic valve regurgitation is not visualized. Aorta: The aortic root and ascending aorta are structurally normal, with no evidence of dilitation. Venous: The inferior vena cava was not well visualized. IAS/Shunts: The interatrial septum was not assessed. Thurmon FairMihai Croitoru MD Electronically signed by Thurmon FairMihai Croitoru MD Signature Date/Time: 08/08/2020/3:12:26 PM    Final     Assessment/Plan 1. Type 2 diabetes mellitus with hyperglycemia, with long-term current use of insulin (HCC) -seems like tid novolog mix 70/30 may actually work for him so will continue that but increase noon and evening doses: CBGs after meals  12 units after breakfast, 16 units after lunch and 14 units after supper  2. Closed fracture of left hip, initial encounter [facility](HCC) -going out for ortho f/u on this and his elbow right now, await f/u orders  3. Left foot drop -mentions he used an AFO for this at home since his stroke--not on at present,  has edema of this foot that he also reports is chronic for him for 20 some years  Family/ staff Communication: d/w snf nurse and notes reviewed  Labs/tests ordered:  Cont to monitor CBGs and adjust as needed  Sonora Catlin L. Santhiago Collingsworth, D.O. Geriatrics [facility] [address], [place] [address] Cell Phone (Mon-Fri 8am-5pm):  [phone] On Call:  [phone] & follow prompts after 5pm & weekends Office Phone:  [phone] Office Fax:  [phone]

## 2020-08-31 ENCOUNTER — Non-Acute Institutional Stay (SKILLED_NURSING_FACILITY): Payer: Medicare Other | Admitting: Family

## 2020-08-31 ENCOUNTER — Encounter: Payer: Self-pay | Admitting: Family

## 2020-08-31 DIAGNOSIS — Z794 Long term (current) use of insulin: Secondary | ICD-10-CM

## 2020-08-31 DIAGNOSIS — E1165 Type 2 diabetes mellitus with hyperglycemia: Secondary | ICD-10-CM

## 2020-08-31 NOTE — Progress Notes (Signed)
Location:  [facility] Room Number: 100 P Place of Service:  SNF [phone]) Provider: Mike Hamre FNP-C  [name], MD  Patient Care Team: [name], MD as PCP - General [name], MD as PCP - Cardiology (Cardiology) [name], MD as Consulting Physician (Ophthalmology)  Extended Emergency Contact Information Primary Emergency Contact: [place] Address: [address]          [place], [place] [address] D[place] of [place] Home Phone: [phone] Relation: Sister  Code Status: Full Code  Goals of care: Advanced Directive information Advanced Directives 08/29/2020  Does Patient Have a Medical Advance Directive? No  Type of Advance Directive -  Does patient want to make changes to medical advance directive? No - Patient declined  Would patient like information on creating a medical advance directive? -     Chief Complaint  Patient presents with   Acute Visit    High blood sugars     HPI:  Pt is a 79 y.o. male seen today for an acute visit for evaluation of high blood sugars.He is seen awake in bed.He denies any acute complains though HPI limited due to his cognitive impairment due to dementia.Facility Nurse reports CBG was in the 500's yesterday advised to give additional 3 units to schedule insulin then recheck CBG.Readings reviewed still running in the 200's -400's in the morning,Lunch and dinner. No signs of hyperglycemia reported.     Past Medical History:  Diagnosis Date   CRI (chronic renal insufficiency)    CVA (cerebral infarction) 1997   Right with residual LUE weakness   Diabetes mellitus type II 1992   HLD (hyperlipidemia) 10/1998   HTN (hypertension) 10/1998   Primary open angle glaucoma    [name]   RBBB    Stroke [facility])    Left sided weakness   Past Surgical History:  Procedure Laterality Date   CATARACT EXTRACTION W/PHACO  09/09/2012   Procedure: CATARACT EXTRACTION PHACO AND  INTRAOCULAR LENS PLACEMENT (IOC);  Surgeon: [name], MD;  Location: [facility] OR;  Service: Ophthalmology;  Laterality: Right;   CATARACT EXTRACTION W/PHACO Left 03/31/2013   Procedure: CATARACT EXTRACTION PHACO AND INTRAOCULAR LENS PLACEMENT (IOC);  Surgeon: [name], MD;  Location: [facility] OR;  Service: Ophthalmology;  Laterality: Left;   EYE SURGERY     HIP ARTHROPLASTY Left 08/07/2020   Procedure: ARTHROPLASTY BIPOLAR HIP (HEMIARTHROPLASTY);  Surgeon: [name], MD;  Location: [facility] ORS;  Service: Orthopedics;  Laterality: Left;   lipoma removal  1970's   Right shoulder   US ECHOCARDIOGRAPHY  03/2013   mod LVH, EF 60%, normal wall motion    Allergies  Allergen Reactions   Tape Rash    Burns    Outpatient Encounter Medications as of 08/31/2020  Medication Sig   acetaminophen (TYLENOL) 325 MG tablet Take 1-2 tablets (325-650 mg total) by mouth every 6 (six) hours as needed for mild pain (pain score 1-3 or temp > 100.5).   aspirin EC 325 MG EC tablet Take 1 tablet (325 mg total) by mouth 2 (two) times daily after a meal.   atorvastatin (LIPITOR) 40 MG tablet Take 1 tablet (40 mg total) by mouth daily.   bisacodyl (DULCOLAX) 10 MG suppository Place 10 mg rectally as needed for moderate constipation.   brimonidine (ALPHAGAN P) 0.1 % SOLN Place 1 drop into both eyes 3 (three) times daily.   diclofenac sodium (VOLTAREN) 1 % GEL Apply 1 application topically 3 (three) times daily as  needed.   ferrous sulfate 325 (65 FE) MG tablet Take 325 mg by mouth daily.    insulin aspart protamine - aspart (NOVOLOG 70/30 MIX) (70-30) 100 UNIT/ML FlexPen 12 units after breakfast, 16 units after lunch and 14 units after supper   latanoprost (XALATAN) 0.005 % ophthalmic solution Place 1 drop into both eyes at bedtime.   Latanoprostene Bunod (VYZULTA) 0.024 % SOLN Apply 1 drop to eye at bedtime.    magnesium hydroxide (MILK OF MAGNESIA) 400 MG/5ML suspension Take by mouth daily as needed for mild  constipation.   MULTIPLE VITAMIN PO Take 1 tablet by mouth daily.   potassium chloride (KLOR-CON) 10 MEQ tablet TAKE 3 TABLETS (30 MEQ TOTAL) BY MOUTH DAILY.   Sodium Phosphates (RA SALINE ENEMA RE) Place rectally as needed.   metoprolol tartrate (LOPRESSOR) 25 MG tablet Take 1 tablet (25 mg total) by mouth 2 (two) times daily.   No facility-administered encounter medications on file as of 08/31/2020.    Review of Systems  Unable to perform ROS: Dementia (additional information provided by facility Nurse )    Immunization History  Administered Date(s) Administered   Fluad Quad(high Dose 65+) 08/02/2019   Influenza Split 09/01/2012   Influenza Whole 09/18/2002, 08/27/2007, 08/29/2008, 09/10/2010   Influenza,inj,Quad PF,6+ Mos 07/28/2013, 10/17/2014, 08/21/2015, 10/18/2016, 08/15/2017   Influenza-Unspecified 09/18/2018   Janssen (J&J) SARS-COV-2 Vaccination 02/24/2020   Pneumococcal Conjugate-13 06/10/2014   Pneumococcal Polysaccharide-23 08/18/1996, 06/19/2010   Td 10/31/1998, 01/26/2009   Zoster 09/01/2012   Pertinent  Health Maintenance Due  Topic Date Due   OPHTHALMOLOGY EXAM  01/07/2020   INFLUENZA VACCINE  06/18/2020   FOOT EXAM  08/01/2020   HEMOGLOBIN A1C  02/03/2021   PNA vac Low Risk Adult  Completed   Fall Risk  05/01/2020 05/01/2020 04/23/2019 04/20/2018 04/25/2017  Falls in the past year? 0 0 0 Yes Yes  Number falls in past yr: 0 0 - 2 or more 2 or more  Injury with Fall? 0 0 - Yes No  Risk Factor Category  - - - High Fall Risk High Fall Risk  Risk for fall due to : - Impaired balance/gait;Impaired mobility - - Impaired balance/gait;Impaired mobility  Follow up - Falls evaluation completed - - Falls prevention discussed   Functional Status Survey:    Vitals:   08/31/20 1633  BP: 136/76  Pulse: 86  Resp: 18  Temp: (!) 97 F (36.1 C)  Weight: 166 lb 6.4 oz (75.5 kg)  Height:  (1.803 m)   Body mass index is 23.21 kg/m. Physical  Exam Vitals and nursing note reviewed.  Constitutional:      General: He is not in acute distress.    Appearance: He is normal weight. He is not ill-appearing.  HENT:     Head: Normocephalic.     Nose: Nose normal. No congestion or rhinorrhea.     Mouth/Throat:     Mouth: Mucous membranes are moist.     Pharynx: Oropharynx is clear. No oropharyngeal exudate or posterior oropharyngeal erythema.  Eyes:     General: No scleral icterus.       Right eye: No discharge.        Left eye: No discharge.     Conjunctiva/sclera: Conjunctivae normal.     Pupils: Pupils are equal, round, and reactive to light.  Cardiovascular:     Rate and Rhythm: Normal rate and regular rhythm.     Pulses: Normal pulses.     Heart sounds: Normal heart  sounds. No murmur heard.  No friction rub. No gallop.   Pulmonary:     Effort: Pulmonary effort is normal. No respiratory distress.     Breath sounds: Normal breath sounds. No wheezing, rhonchi or rales.  Chest:     Chest wall: No tenderness.  Abdominal:     General: Bowel sounds are normal. There is no distension.     Palpations: Abdomen is soft. There is no mass.     Tenderness: There is no abdominal tenderness. There is no right CVA tenderness, left CVA tenderness, guarding or rebound.  Musculoskeletal:        General: No swelling or tenderness.     Right lower leg: No edema.     Left lower leg: No edema.     Comments: Holding left hand close had sling in the past.   Skin:    General: Skin is warm and dry.     Coloration: Skin is not pale.     Findings: No bruising, erythema or rash.     Comments: Stage  ulcer on right gluteal without any signs of infection wound bed with covered with protective cream.   Neurological:     Mental Status: He is alert. Mental status is at baseline.     Cranial Nerves: No cranial nerve deficit.     Sensory: No sensory deficit.     Motor: No weakness.     Gait: Gait abnormal.  Psychiatric:        Mood and Affect: Mood  normal.        Speech: Speech normal.        Behavior: Behavior normal.        Cognition and Memory: Memory is impaired.     Labs reviewed: Recent Labs    08/07/20 1918 08/08/20 0026 08/09/20 0246 08/09/20 0246 08/10/20 0452 08/10/20 0452 08/11/20 0553 08/11/20 0553 08/12/20 0540 08/13/20 0548 08/21/20 0000  NA 153*   < > 144   < > 149*   < > 146*   < > 146* 142 138  K 4.5   < > 3.3*   < > 3.4*   < > 3.7   < > 3.8 3.9 4.7  CL 109   < > 106   < > 110   < > 108   < > 108 110 102  CO2 25   < > 27   < > 27   < > 26   < > 26 22 25*  GLUCOSE 108*   < > 174*   < > 60*   < > 171*  --  221* 161*  --   BUN 28*   < > 18   < > 26*   < > 25*   < > 25* 24* 10  CREATININE 1.26*   < > 1.21   < > 1.40*   < > 1.33*   < > 1.34* 1.04 0.8  CALCIUM 8.9   < > 7.8*   < > 8.0*   < > 7.7*   < > 7.9* 7.6* 8.5*  MG 2.6*  --  2.2  --  2.6*  --   --   --   --   --   --    < > = values in this interval not displayed.   Recent Labs    08/10/20 0452 08/10/20 0452 08/11/20 0553 08/12/20 0540 08/21/20 0000  AST 89*   < > 129* 109* 23  ALT 36   < >  52* 54* 21  ALKPHOS 116  --  139* 146*  --   BILITOT 1.4*  --  1.2 1.1  --   PROT 6.5  --  6.3* 6.4*  --   ALBUMIN 2.3*  --  2.2* 2.1*  --    < > = values in this interval not displayed.   Recent Labs    08/11/20 0553 08/11/20 0553 08/12/20 0540 08/13/20 0548 08/21/20 0000  WBC 10.4   < > 10.1 10.7* 9.7  NEUTROABS 7.1  --  7.0 7.8*  --   HGB 11.9*   < > 11.3* 11.6* 11.8*  HCT 34.3*   < > 33.1* 33.2* 36*  MCV 73.8*  --  74.0* 73.5*  --   PLT 165   < > 193 230 435*   < > = values in this interval not displayed.   Lab Results  Component Value Date   TSH 1.369 08/07/2020   Lab Results  Component Value Date   HGBA1C 7.7 (H) 08/06/2020   Lab Results  Component Value Date   CHOL 104 05/01/2020   HDL 51.80 05/01/2020   LDLCALC 43 05/01/2020   LDLDIRECT 49.7 09/01/2012   TRIG 48.0 05/01/2020   CHOLHDL 2 05/01/2020    Significant  Diagnostic Results in last 30 days:  DG Chest 1 View  Result Date: 08/06/2020 CLINICAL DATA:  Left leg pain related to fall. EXAM: CHEST  1 VIEW COMPARISON:  October 30, 2013 FINDINGS: The heart, hila, and mediastinum are normal. No pneumothorax. Minimal atelectasis in the left base. The lungs are otherwise clear. No other acute abnormalities. IMPRESSION: No active disease. Electronically Signed   By: Gerome Sam III M.D   On: 08/06/2020 18:08   DG Pelvis 1-2 Views  Result Date: 08/06/2020 CLINICAL DATA:  Pain after fall EXAM: PELVIS - 1-2 VIEW COMPARISON:  None. FINDINGS: A subcapital fracture seen to the left hip. Evaluation for dislocation is limited as only an AP view was obtained. No definite dislocation noted. No other bony abnormalities are identified. IMPRESSION: Subcapital fracture through the proximal left hip. Evaluation for dislocation is limited without a lateral view but there is no evidence of dislocation on this study. Electronically Signed   By: Gerome Sam III M.D   On: 08/06/2020 18:09   DG Elbow Complete Left  Result Date: 08/06/2020 CLINICAL DATA:  Pain after fall EXAM: LEFT ELBOW - COMPLETE 3+ VIEW COMPARISON:  None. FINDINGS: Evaluation is limited due to positioning. The proximal ulna and radius are not well visualized on the first 3 images as a overlap the humerus. No joint effusion is seen on the lateral view. Mild irregularity of the radial head on the lateral view suggest the possibility of a subtle fracture. No other abnormalities. IMPRESSION: Evaluation is limited due to positioning as above. Irregularity of the radial head on the lateral view is concerning for a subtle fracture. No joint effusion identified. Electronically Signed   By: Gerome Sam III M.D   On: 08/06/2020 18:07   CT Head Wo Contrast  Result Date: 08/06/2020 CLINICAL DATA:  Status post fall. EXAM: CT HEAD WITHOUT CONTRAST TECHNIQUE: Contiguous axial images were obtained from the base of the  skull through the vertex without intravenous contrast. COMPARISON:  October 30, 2013 FINDINGS: Brain: There is mild to moderate severity cerebral atrophy with widening of the extra-axial spaces and ventricular dilatation. There are areas of decreased attenuation within the white matter tracts of the supratentorial brain, consistent with microvascular disease  changes. A large area of cortical encephalomalacia, with adjacent chronic white matter low attenuation, is seen throughout the right hemisphere. This involves predominantly the right parietal lobe and is unchanged in appearance when compared to the prior study. Vascular: No hyperdense vessel or unexpected calcification. Skull: Normal. Negative for fracture or focal lesion. Sinuses/Orbits: No acute finding. Other: None. IMPRESSION: 1. No acute intracranial abnormality. 2. Generalized cerebral atrophy. 3. Chronic predominant right parietal lobe infarct. Electronically Signed   By: Aram Candela M.D.   On: 08/06/2020 19:00   CT Cervical Spine Wo Contrast  Result Date: 08/06/2020 CLINICAL DATA:  Left leg pain related to fall EXAM: CT CERVICAL SPINE WITHOUT CONTRAST TECHNIQUE: Multidetector CT imaging of the cervical spine was performed without intravenous contrast. Multiplanar CT image reconstructions were also generated. COMPARISON:  None. FINDINGS: Alignment: There is straightening of the normal cervical lordosis. Skull base and vertebrae: Visualized skull base is intact. No atlanto-occipital dissociation. The vertebral body heights are well maintained. No fracture or pathologic osseous lesion seen. Soft tissues and spinal canal: The visualized paraspinal soft tissues are unremarkable. No prevertebral soft tissue swelling is seen. The spinal canal is grossly unremarkable, no large epidural collection or significant canal narrowing. Disc levels: Multilevel cervical spine spondylosis is seen with anterior osteophytes, disc osteophyte complex and  uncovertebral osteophytes most notable C5-C6 and C6-C7 with moderate neural foraminal narrowing and mild central canal stenosis. Upper chest: The lung apices are clear. Thoracic inlet is within normal limits. Other: None IMPRESSION: No acute fracture or malalignment of the spine. 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 Electronically Signed   By: Jonna Clark M.D.   On: 08/06/2020 19:25   Pelvis Portable  Result Date: 08/07/2020 CLINICAL DATA:  Status post left hip replacement EXAM: PORTABLE PELVIS 1 VIEWS COMPARISON:  08/06/2020 FINDINGS: Left hip prosthesis is now seen. No acute fracture or dislocation is seen. No soft tissue abnormality is noted. IMPRESSION: Status post left hip replacement Electronically Signed   By: Alcide Clever M.D.   On: 08/07/2020 19:42   DG CHEST PORT 1 VIEW  Result Date: 08/09/2020 CLINICAL DATA:  Hyperglycemia EXAM: PORTABLE CHEST 1 VIE COMPARISON:  August 06, 2020 FINDINGS: Lungs are clear. Heart size and pulmonary vascularity are normal. No adenopathy. No bone lesions. IMPRESSION: Lungs clear. Cardiac silhouette within normal limits. No adenopathy. Electronically Signed   By: Bretta Bang III M.D.   On: 08/09/2020 14:35   DG Shoulder Left  Addendum Date: 08/06/2020   ADDENDUM REPORT: 08/06/2020 18:05 ADDENDUM: Upon reviewing the left humeral images, there appears to be a subtle a fracture through the neck of the left humerus only seen on the true AP view. This finding was described on the humeral images. Electronically Signed   By: Gerome Sam III M.D   On: 08/06/2020 18:05   Result Date: 08/06/2020 CLINICAL DATA:  Pain after fall EXAM: LEFT SHOULDER - 2+ VIEW COMPARISON:  None. FINDINGS: Irregularity along the inferior aspect of the glenoid suggest the possibility of previous trauma. No acute fractures or dislocations are seen on today's study. Limited views of the chest are normal. IMPRESSION: Negative. Electronically Signed: By: Gerome Sam III M.D On: 08/06/2020 18:01   DG Humerus Left  Result Date: 08/06/2020 CLINICAL DATA:  Pain after fall EXAM: LEFT HUMERUS - 2+ VIEW COMPARISON:  None. FINDINGS: There is a subtle lucency extending through the neck of the left humerus only seen on the final image. In retrospect, this is also  seen on 1 of the images of the left shoulder but is very subtle. No other fractures. IMPRESSION: Subtle nondisplaced fracture through the neck of the humerus only seen on 1 image of the humeral films and 1 image of the left shoulder films. The finding is subtle but appears to be real. Electronically Signed   By: Gerome Samavid  Williams III M.D   On: 08/06/2020 18:04   DG Femur Min 2 Views Left  Result Date: 08/06/2020 CLINICAL DATA:  Pain after fall. EXAM: LEFT FEMUR 2 VIEWS COMPARISON:  None. FINDINGS: Only the distal half of the femur was imaged on the lateral view. Only the distal third of the femur was imaged on the AP view. The proximal half of the femur was imaged on the lateral view. Given the lack of visualization of the entire femur in all three views, evaluation is limited. Within these limitations, no fractures are seen. IMPRESSION: Only a portion of the femur was seen in each of the three views which limits evaluation. Within these limitations, no fractures are seen. Electronically Signed   By: Gerome Samavid  Williams III M.D   On: 08/06/2020 18:00   ECHOCARDIOGRAM LIMITED  Result Date: 08/08/2020    ECHOCARDIOGRAM LIMITED REPORT   Patient Name:   Denton LankSamuel J Sigg Jr. Date of Exam: 08/08/2020 Medical Rec #:  [id number]         Height:       71.5 in Accession #:    [id number]        Weight:       172.0 lb Date of Birth:  02/23/1941          BSA:          1.988 m Patient Age:    79 years          BP:           1126/89 mmHg Patient Gender: M                 HR:           130 bpm. Exam Location:  Inpatient Procedure: 2D Echo Indications:    Abnormal EKG  History:        Patient has prior history of Echocardiogram  examinations, most                 recent 03/26/2013. Stroke, Arrythmias:Atrial Fibrillation and                 Tachycardia; Risk Factors:Diabetes and Hypertension. S/p Hip                 surgery.  Sonographer:    Lavenia AtlasBrooke Strickland Referring Phys: [id number] Alessandra BevelsNEELIMA KAMINENI IMPRESSIONS  1. There is incessant arrhythmia with varying QRS morphology (appears to be atrial fibrillation with frequent PVCs), that makes assessment of regional and global left ventricular function more challenging. Left ventricular ejection fraction, by estimation, is 55 to 60%. The left ventricle has normal function. The left ventricle has no regional wall motion abnormalities. Left ventricular diastolic function could not be evaluated.  2. Right ventricular systolic function is normal. The right ventricular size is normal. Tricuspid regurgitation signal is inadequate for assessing PA pressure.  3. The mitral valve is normal in structure. Mild mitral valve regurgitation.  4. The aortic valve is grossly normal. There is mild calcification of the aortic valve. Aortic valve regurgitation is not visualized. Mild aortic valve sclerosis is present, with no evidence of aortic valve stenosis. FINDINGS  Left Ventricle: There  is incessant arrhythmia with varying QRS morphology (appears to be atrial fibrillation with frequent PVCs), that makes assessment of regional and global left ventricular function more challenging. Left ventricular ejection fraction, by estimation, is 55 to 60%. The left ventricle has normal function. The left ventricle has no regional wall motion abnormalities. There is no left ventricular hypertrophy. Left ventricular diastolic function could not be evaluated. Left ventricular diastolic function could not be evaluated due to atrial fibrillation. Right Ventricle: The right ventricular size is normal. No increase in right ventricular wall thickness. Right ventricular systolic function is normal. Tricuspid regurgitation signal is  inadequate for assessing PA pressure. Right Atrium: Right atrial size was normal in size. Pericardium: There is no evidence of pericardial effusion. Mitral Valve: The mitral valve is normal in structure. Mild mitral valve regurgitation. Tricuspid Valve: The tricuspid valve is grossly normal. Tricuspid valve regurgitation is not demonstrated. Aortic Valve: The aortic valve is grossly normal. There is mild calcification of the aortic valve. Aortic valve regurgitation is not visualized. Mild aortic valve sclerosis is present, with no evidence of aortic valve stenosis. Pulmonic Valve: The pulmonic valve was not well visualized. Pulmonic valve regurgitation is not visualized. Aorta: The aortic root and ascending aorta are structurally normal, with no evidence of dilitation. Venous: The inferior vena cava was not well visualized. IAS/Shunts: The interatrial septum was not assessed. Rachelle Hora Croitoru MD Electronically signed by Thurmon Fair MD Signature Date/Time: 08/08/2020/3:12:26 PM    Final     Assessment/Plan   Type 2 diabetes mellitus with hyperglycemia, with long-term current use of insulin (HCC) CBG continues to run in the 200's-400's with some 500's reported. Insulin recently adjusted by Dr.Reed to 12 units after breakfast; 16 units after lunch and 14 after dinner.will increase Novolog mix 70/30 to 14 units after breakfast; 18 units after lunch and 16 units SQ after dinner.  - continue to monitor CBG   Family/ staff Communication: Reviewed plan of care with patient and facility Nurse   Labs/tests ordered: None   Next Appointment:   Caesar Bookman, NP

## 2020-09-04 ENCOUNTER — Other Ambulatory Visit: Payer: Self-pay | Admitting: *Deleted

## 2020-09-04 NOTE — Patient Outreach (Signed)
[facility] Post- Acute Care Coordinator follow up. Member screened for potential [facility] Care Management needs as a benefit of [employer].  Per Patient Marcus Downs member resides in [place].  Communication sent to SW to request update on transtion plans.   Will continue to follow while member resides in [place].   Raiford Noble, MSN-Ed, RN,BSN [facility] Post Acute Care Coordinator [phone] [facility]) [phone]  (Toll free office)

## 2020-09-09 ENCOUNTER — Other Ambulatory Visit: Payer: Self-pay | Admitting: Family Medicine

## 2020-09-11 ENCOUNTER — Ambulatory Visit: Payer: Medicare Other | Admitting: Podiatry

## 2020-09-11 NOTE — Telephone Encounter (Signed)
Metformin ER 500 mg and losartan 100 mg were discontinued by Dr. Rhetta Mura on 08/14/20 at discharge.

## 2020-09-13 ENCOUNTER — Other Ambulatory Visit: Payer: Self-pay | Admitting: *Deleted

## 2020-09-13 NOTE — Patient Outreach (Signed)
[facility] Post- Acute Care Coordinator follow up. Member screened for potential [facility] Care Management needs as a benefit of [employer].  Verified in Patient Ping that member resides in [place] [facility].   Communication sent to SNF SW to request update about transition plans and potential [facility] Care Management needs.    Ambrea Hegler, MSN-Ed, RN,BSN [facility] Post Acute Care Coordinator [phone] ( [employer]) [phone]  (Toll free office)  

## 2020-09-14 ENCOUNTER — Other Ambulatory Visit: Payer: Self-pay | Admitting: *Deleted

## 2020-09-14 ENCOUNTER — Telehealth: Payer: Self-pay

## 2020-09-14 NOTE — Telephone Encounter (Signed)
Noted! Thank you

## 2020-09-14 NOTE — Patient Outreach (Signed)
[facility] Post- Acute Care Coordinator follow up. Member screened for potential [facility] Care Management needs as a benefit of [employer].  Update received from [facility] SW indicating transition plan will be to remain in LTC at facility.  No identifiable [facility] Care Management needs at this time.    Raiford Noble, MSN-Ed, RN,BSN [facility] Post Acute Care Coordinator [phone] [facility]) [phone]  (Toll free office)

## 2020-09-14 NOTE — Telephone Encounter (Signed)
DIL called to inform us that pt is in a SNF at [facility]. She will call us back to let us know if [facility] will be treating him there or if we will still be doing his medications and care.

## 2020-09-21 ENCOUNTER — Non-Acute Institutional Stay (SKILLED_NURSING_FACILITY): Payer: Medicare Other | Admitting: Family

## 2020-09-21 ENCOUNTER — Encounter: Payer: Self-pay | Admitting: Family

## 2020-09-21 DIAGNOSIS — I10 Essential (primary) hypertension: Secondary | ICD-10-CM | POA: Diagnosis not present

## 2020-09-21 DIAGNOSIS — E785 Hyperlipidemia, unspecified: Secondary | ICD-10-CM

## 2020-09-21 DIAGNOSIS — Z96649 Presence of unspecified artificial hip joint: Secondary | ICD-10-CM | POA: Diagnosis not present

## 2020-09-21 DIAGNOSIS — E1169 Type 2 diabetes mellitus with other specified complication: Secondary | ICD-10-CM | POA: Diagnosis not present

## 2020-09-21 DIAGNOSIS — E1159 Type 2 diabetes mellitus with other circulatory complications: Secondary | ICD-10-CM | POA: Diagnosis not present

## 2020-09-21 DIAGNOSIS — I69354 Hemiplegia and hemiparesis following cerebral infarction affecting left non-dominant side: Secondary | ICD-10-CM

## 2020-09-21 NOTE — Progress Notes (Signed)
Location:    [facility].   Nursing Home Room Number: [id number] Place of Service:  SNF (31) Provider:  Richarda BladeNgetich, Khyron Garno, NP    Patient Care Team: Eustaquio BoydenGutierrez, Javier, MD as PCP - General Jens Somrenshaw, Madolyn FriezeBrian S, MD as PCP - Cardiology (Cardiology) Chalmers GuestWhitaker, Roy, MD as Consulting Physician ([facility])  Extended Emergency Contact Information Primary Emergency Contact: Marion OaksBoyd,Cassandra Address: [address]          [place], [place] [address] [place] Home Phone: [phone] Relation: Sister  Code Status:  Full code  Goals of care: Advanced Directive information Advanced Directives 09/21/2020  Does Patient Have a Medical Advance Directive? No  Type of Advance Directive -  Does patient want to make changes to medical advance directive? No - Patient declined  Would patient like information on creating a medical advance directive? -     Chief Complaint  Patient presents with  . Medical Management of Chronic Issues    Routine Visit.  Marland Kitchen. Health Maintenance    Discuss the need for Eye Exam, and Foot Exam.  . Immunizations    Discuss the need for Influenza Vaccine.    HPI:  Pt is a 79 y.o. male seen today for medical management of chronic diseases.He has medical history of Hypertension,Type 2 DM with vascular disease,Hyperlipidemia,Hx of stroke with left sided weakness,dysphagia,Paroxysmal arterial tachycardia among other conditions.He is seen in his room today.He is seen in his bed awake watching TV.He is confused unable to provide HPI information.He tells me that the [facility] Center[facility] in the TV acting was his son and was trying to call him on his phone.unable to reorient patient.  His blood pressure log reviewed readings in the 120's/70's - 140's/70's. His previous uncontrolled CBG's have improved ranging in the 120's- 180's in the mornings and 100's -200 's in the afternoon.Nurse reports no hypoglycemia.He is due for diabetic annual eye.He is up to date with annual foot exam. Seen  by podiatrist Dr.Mayer Earl LitesGregory 05/29/2020 for onychomycosis. No recent fall episode. He continues to work with therapy post left hip hemiarthroplasty.   Influenza vaccine to be administered per facility Protocol if desired.       Past Medical History:  Diagnosis Date  . CRI (chronic renal insufficiency)   . 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 Diabetes mellitus type II 1992  . HLD (hyperlipidemia) 10/1998  . HTN (hypertension) 10/1998  . Primary open angle glaucoma    [name]  . RBBB   . Stroke [facility](HCC)    Left sided weakness   Past Surgical History:  Procedure Laterality Date  . CATARACT EXTRACTION W/PHACO  09/09/2012   Procedure: CATARACT EXTRACTION PHACO AND INTRAOCULAR LENS PLACEMENT (IOC);  Surgeon: Chalmers Guestoy [name], MD;  [facility];  Service: Ophthalmology;  Laterality: Right;  . CATARACT EXTRACTION W/PHACO Left 03/31/2013   Procedure: CATARACT EXTRACTION PHACO AND INTRAOCULAR LENS PLACEMENT (IOC);  Surgeon: Chalmers Guestoy [name], MD;  [facility];  Service: Ophthalmology;  Laterality: Left;  . EYE SURGERY    . HIP ARTHROPLASTY Left 08/07/2020   Procedure: ARTHROPLASTY BIPOLAR HIP (HEMIARTHROPLASTY);  Surgeon: Jodi GeraldsGraves, John, MD;  Location: [facility] ORS;  Service: Orthopedics;  Laterality: Left;  . lipoma removal  1970's   Right shoulder  . US ECHOCARDIOGRAPHY  03/2013   mod LVH, EF 60%, normal wall motion    Allergies  Allergen Reactions  . Tape Rash    Burns    Allergies as of 09/21/2020  Reactions   Tape Rash   Burns      Medication List       Accurate as of September 21, 2020 10:01 AM. If you have any questions, ask your nurse or doctor.        STOP taking these medications   Vyzulta 0.024 % Soln Generic drug: Latanoprostene Bunod Stopped by: Caesar Bookman, NP     TAKE these medications   acetaminophen 325 MG tablet Commonly known as: TYLENOL Take 1-2 tablets (325-650 mg total) by mouth every 6 (six) hours as needed for mild  pain (pain score 1-3 or temp > 100.5).   aspirin 325 MG tablet Take 325 mg by mouth daily. What changed: Another medication with the same name was removed. Continue taking this medication, and follow the directions you see here. Changed by: Caesar Bookman, NP   atorvastatin 40 MG tablet Commonly known as: LIPITOR Take 1 tablet (40 mg total) by mouth daily.   bisacodyl 10 MG suppository Commonly known as: DULCOLAX Place 10 mg rectally as needed for moderate constipation.   brimonidine 0.1 % Soln Commonly known as: ALPHAGAN P Place 1 drop into both eyes 3 (three) times daily.   diclofenac sodium 1 % Gel Commonly known as: VOLTAREN Apply 1 application topically 3 (three) times daily as needed.   ferrous sulfate 325 (65 FE) MG tablet Take 325 mg by mouth daily.   Latanoprost 0.005 % Emul Place 1 drop into both eyes at bedtime.   latanoprost 0.005 % ophthalmic solution Commonly known as: XALATAN Place 1 drop into both eyes at bedtime.   magnesium hydroxide 400 MG/5ML suspension Commonly known as: MILK OF MAGNESIA Take by mouth daily as needed for mild constipation.   metoprolol tartrate 25 MG tablet Commonly known as: LOPRESSOR Take 1 tablet (25 mg total) by mouth 2 (two) times daily.   MULTIPLE VITAMIN PO Take 1 tablet by mouth daily.   NOVOLOG MIX 70/30 FLEXPEN [place] Inject into the skin in the morning, at noon, and at bedtime. Inject 14 Units after breakfast, Inject 16 units after dinner, Inject 18 units after lunch. What changed: Another medication with the same name was removed. Continue taking this medication, and follow the directions you see here. Changed by: Caesar Bookman, NP   potassium chloride 10 MEQ tablet Commonly known as: KLOR-CON TAKE 3 TABLETS BY MOUTH DAILY   RA SALINE ENEMA RE Place rectally as needed.       Review of Systems  Unable to perform ROS: Other (additional information provided by facility Nurse patient confused during visit trying  to call son who he states in the TV program he was watching.)    Immunization History  Administered Date(s) Administered  . Fluad Quad(high Dose 65+) 08/02/2019  . Influenza Split 09/01/2012  . Influenza Whole 09/18/2002, 08/27/2007, 08/29/2008, 09/10/2010  . Influenza,inj,Quad PF,6+ Mos 07/28/2013, 10/17/2014, 08/21/2015, 10/18/2016, 08/15/2017  . Influenza-Unspecified 09/18/2018  . Janssen (J&J) SARS-COV-2 Vaccination 02/24/2020  . Pneumococcal Conjugate-13 06/10/2014  . Pneumococcal Polysaccharide-23 08/18/1996, 06/19/2010  . Td 10/31/1998, 01/26/2009  . Zoster 09/01/2012   Pertinent  Health Maintenance Due  Topic Date Due  . OPHTHALMOLOGY EXAM  01/07/2020  . INFLUENZA VACCINE  06/18/2020  . FOOT EXAM  08/01/2020  . HEMOGLOBIN A1C  02/03/2021  . PNA vac Low Risk Adult  Completed   Fall Risk  05/01/2020 05/01/2020 04/23/2019 04/20/2018 04/25/2017  Falls in the past year? 0 0 0 Yes Yes  Number falls in past yr:  0 0 - 2 or more 2 or more  Injury with Fall? 0 0 - Yes No  Risk Factor Category  - - - High Fall Risk High Fall Risk  Risk for fall due to : - Impaired balance/gait;Impaired mobility - - Impaired balance/gait;Impaired mobility  Follow up - Falls evaluation completed - - Falls prevention discussed   Functional Status Survey:    Vitals:   09/21/20 0933  BP: 140/77  Pulse: 100  Resp: 18  Temp: (!) 97 F (36.1 C)  Weight: 166 lb 6.4 oz (75.5 kg)   Body mass index is 23.21 kg/m. Physical Exam Vitals and nursing note reviewed.  Constitutional:      General: He is not in acute distress.    Appearance: He is normal weight. He is not ill-appearing.  HENT:     Head: Normocephalic.     Nose: Nose normal. No congestion or rhinorrhea.     Mouth/Throat:     Mouth: Mucous membranes are moist.     Pharynx: Oropharynx is clear. No oropharyngeal exudate or posterior oropharyngeal erythema.  Eyes:     General: No scleral icterus.       Right eye: No discharge.        Left  eye: No discharge.     Conjunctiva/sclera: Conjunctivae normal.     Pupils: Pupils are equal, round, and reactive to light.  Cardiovascular:     Rate and Rhythm: Normal rate and regular rhythm.     Pulses: Normal pulses.     Heart sounds: Normal heart sounds. No murmur heard.  No friction rub. No gallop.   Pulmonary:     Effort: Pulmonary effort is normal. No respiratory distress.     Breath sounds: Normal breath sounds. No wheezing, rhonchi or rales.  Chest:     Chest wall: No tenderness.  Abdominal:     General: Bowel sounds are normal. There is no distension.     Palpations: Abdomen is soft. There is no mass.     Tenderness: There is no abdominal tenderness. There is no right CVA tenderness, left CVA tenderness, guarding or rebound.  Genitourinary:    Comments: Foley catheter in place draining adequate amounts of urine.  Musculoskeletal:        General: No swelling or tenderness.     Cervical back: Normal range of motion. No rigidity or tenderness.     Right lower leg: No edema.     Left lower leg: No edema.     Comments: Unsteady gait   Lymphadenopathy:     Cervical: No cervical adenopathy.  Skin:    General: Skin is warm and dry.     Coloration: Skin is not pale.     Findings: No bruising, erythema or rash.  Neurological:     Mental Status: He is alert.     Motor: Weakness present.     Gait: Gait abnormal.     Comments: Confused  Left hemiparesis   Psychiatric:        Thought Content: Thought content normal.        Judgment: Judgment normal.     Comments: Memory loss  Dysarthria      Labs reviewed: Recent Labs    08/07/20 1918 08/08/20 0026 08/09/20 0246 08/09/20 0246 08/10/20 2229 08/10/20 0452 08/11/20 0553 08/11/20 0553 08/12/20 0540 08/13/20 0548 08/21/20 0000  NA 153*   < > 144   < > 149*   < > 146*   < > 146* 142  138  K 4.5   < > 3.3*   < > 3.4*   < > 3.7   < > 3.8 3.9 4.7  CL 109   < > 106   < > 110   < > 108   < > 108 110 102  CO2 25   < > 27    < > 27   < > 26   < > 26 22 25*  GLUCOSE 108*   < > 174*   < > 60*   < > 171*  --  221* 161*  --   BUN 28*   < > 18   < > 26*   < > 25*   < > 25* 24* 10  CREATININE 1.26*   < > 1.21   < > 1.40*   < > 1.33*   < > 1.34* 1.04 0.8  CALCIUM 8.9   < > 7.8*   < > 8.0*   < > 7.7*   < > 7.9* 7.6* 8.5*  MG 2.6*  --  2.2  --  2.6*  --   --   --   --   --   --    < > = values in this interval not displayed.   Recent Labs    08/10/20 0452 08/10/20 0452 08/11/20 0553 08/12/20 0540 08/21/20 0000  AST 89*   < > 129* 109* 23  ALT 36   < > 52* 54* 21  ALKPHOS 116  --  139* 146*  --   BILITOT 1.4*  --  1.2 1.1  --   PROT 6.5  --  6.3* 6.4*  --   ALBUMIN 2.3*  --  2.2* 2.1*  --    < > = values in this interval not displayed.   Recent Labs    08/11/20 0553 08/11/20 0553 08/12/20 0540 08/13/20 0548 08/21/20 0000  WBC 10.4   < > 10.1 10.7* 9.7  NEUTROABS 7.1  --  7.0 7.8*  --   HGB 11.9*   < > 11.3* 11.6* 11.8*  HCT 34.3*   < > 33.1* 33.2* 36*  MCV 73.8*  --  74.0* 73.5*  --   PLT 165   < > 193 230 435*   < > = values in this interval not displayed.   Lab Results  Component Value Date   TSH 1.369 08/07/2020   Lab Results  Component Value Date   HGBA1C 7.7 (H) 08/06/2020   Lab Results  Component Value Date   CHOL 104 05/01/2020   HDL 51.80 05/01/2020   LDLCALC 43 05/01/2020   LDLDIRECT 49.7 09/01/2012   TRIG 48.0 05/01/2020   CHOLHDL 2 05/01/2020    Significant Diagnostic Results in last 30 days:  No results found.  Assessment/Plan 1. Essential hypertension B/p well controlled. Continue on metoprolol tartrate 25 mg tablet twice daily.  2. Type 2 diabetes mellitus with vascular disease (HCC) Lab Results  Component Value Date   HGBA1C 7.7 (H) 08/06/2020   CBG has improved  Continue on Novolog 70/30  - Up to date with annual foot exam.continue to follow up with Podiatrist. - unclear if seen by Ophthalmology.will defer until post rehab.   3. Hyperlipidemia associated  with type 2 diabetes mellitus (HCC) Continue on Atorvastatin  - continue heart healthy diet.  4. 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 Continue on metoprolol,ASA and  Atorvastatin.   5. S/P hip hemiarthroplasty Pain under controlled.  Continue with Physical Therapy.  Family/ staff Communication: Reviewed plan of care with patient and facility Nurse   Labs/tests ordered: None.

## 2020-09-25 ENCOUNTER — Non-Acute Institutional Stay (SKILLED_NURSING_FACILITY): Payer: Medicare Other | Admitting: Family

## 2020-09-25 ENCOUNTER — Encounter: Payer: Self-pay | Admitting: Family

## 2020-09-25 DIAGNOSIS — B3749 Other urogenital candidiasis: Secondary | ICD-10-CM | POA: Diagnosis not present

## 2020-09-25 DIAGNOSIS — I10 Essential (primary) hypertension: Secondary | ICD-10-CM | POA: Diagnosis not present

## 2020-09-25 DIAGNOSIS — R451 Restlessness and agitation: Secondary | ICD-10-CM

## 2020-09-25 DIAGNOSIS — N3001 Acute cystitis with hematuria: Secondary | ICD-10-CM

## 2020-09-25 LAB — COMPREHENSIVE METABOLIC PANEL
Calcium: 9.7 (ref 8.7–10.7)
GFR calc Af Amer: 72.97
GFR calc non Af Amer: 62.96

## 2020-09-25 LAB — CBC AND DIFFERENTIAL
HCT: 43 (ref 41–53)
Hemoglobin: 14.6 (ref 13.5–17.5)
Platelets: 383 (ref 150–399)
WBC: 10.5

## 2020-09-25 LAB — BASIC METABOLIC PANEL
BUN: 9 (ref 4–21)
CO2: 23 — AB (ref 13–22)
Chloride: 100 (ref 99–108)
Creatinine: 1.1 (ref 0.6–1.3)
Glucose: 198
Potassium: 4.3 (ref 3.4–5.3)
Sodium: 142 (ref 137–147)

## 2020-09-25 LAB — CBC: RBC: 5.31 — AB (ref 3.87–5.11)

## 2020-09-25 NOTE — Progress Notes (Addendum)
Location:    [facility].   Nursing Home Room Number: 100-P Place of Service:  SNF (31) Provider: Richarda Blade, NP     Patient Care Team: [name], MD as PCP - General [name], MD as PCP - Cardiology (Cardiology) [name], MD as Consulting Physician (Ophthalmology)  Extended Emergency Contact Information Primary Emergency Contact: [place] Address: [address]          [place], [place] [address] [place] Home Phone: [phone] Relation: Sister  Code Status:  Full Code  Goals of care: Advanced Directive information Advanced Directives 09/25/2020  Does Patient Have a Medical Advance Directive? No  Type of Advance Directive -  Does patient want to make changes to medical advance directive? No - Patient declined  Would patient like information on creating a medical advance directive? -     Chief Complaint  Patient presents with  . Acute Visit    Combative behavior, agressive toward staff, wandering, and removes clothes.     HPI:  Pt is a 79 y.o. male seen today for an acute visit for evaluation of combative behavior, agressive toward staff, wandering, and removes clothes.Facility Nurse states patient wandering on the hallways and removing his clothes unable to redirect.No fever,chills or cough reported. Nurse states took patient back to his room had large bowel movement and behaviors seemed to have improved though remains confused.  He is seen in his room during visit sitting on his wheelchair.He is unable to to provide HPI information.    Past Medical History:  Diagnosis Date  . CRI (chronic renal insufficiency)   . 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 Diabetes mellitus type II 1992  . HLD (hyperlipidemia) 10/1998  . HTN (hypertension) 10/1998  . Primary open angle glaucoma    [name]  . RBBB   . Stroke [facility])    Left sided weakness   Past Surgical History:  Procedure  Laterality Date  . CATARACT EXTRACTION W/PHACO  09/09/2012   Procedure: CATARACT EXTRACTION PHACO AND INTRAOCULAR LENS PLACEMENT (IOC);  Surgeon: [name], MD;  Location: [facility] OR;  Service: Ophthalmology;  Laterality: Right;  . CATARACT EXTRACTION W/PHACO Left 03/31/2013   Procedure: CATARACT EXTRACTION PHACO AND INTRAOCULAR LENS PLACEMENT (IOC);  Surgeon: [name], MD;  Location: [facility] OR;  Service: Ophthalmology;  Laterality: Left;  . EYE SURGERY    . HIP ARTHROPLASTY Left 08/07/2020   Procedure: ARTHROPLASTY BIPOLAR HIP (HEMIARTHROPLASTY);  Surgeon: [name], MD;  Location: [facility] ORS;  Service: Orthopedics;  Laterality: Left;  . lipoma removal  1970's   Right shoulder  . US ECHOCARDIOGRAPHY  03/2013   mod LVH, EF 60%, normal wall motion    Allergies  Allergen Reactions  . Tape Rash    Burns    Allergies as of 09/25/2020      Reactions   Tape Rash   Burns      Medication List       Accurate as of September 25, 2020  2:01 PM. If you have any questions, ask your nurse or doctor.        acetaminophen 325 MG tablet Commonly known as: TYLENOL Take 1-2 tablets (325-650 mg total) by mouth every 6 (six) hours as needed for mild pain (pain score 1-3 or temp > 100.5).   aspirin 325 MG tablet Take 325 mg by mouth daily.   atorvastatin 40 MG tablet Commonly known as: LIPITOR Take 1 tablet (40  mg total) by mouth daily.   bisacodyl 10 MG suppository Commonly known as: DULCOLAX Place 10 mg rectally as needed for moderate constipation.   brimonidine 0.1 % Soln Commonly known as: ALPHAGAN P Place 1 drop into both eyes 3 (three) times daily.   diclofenac sodium 1 % Gel Commonly known as: VOLTAREN Apply 1 application topically 3 (three) times daily as needed.   ferrous sulfate 325 (65 FE) MG tablet Take 325 mg by mouth daily.   latanoprost 0.005 % ophthalmic solution Commonly known as: XALATAN Place 1 drop into both eyes at bedtime.   magnesium hydroxide 400 MG/5ML  suspension Commonly known as: MILK OF MAGNESIA Take by mouth daily as needed for mild constipation.   metoprolol tartrate 25 MG tablet Commonly known as: LOPRESSOR Take 1 tablet (25 mg total) by mouth 2 (two) times daily.   MULTIPLE VITAMIN PO Take 1 tablet by mouth daily.   NOVOLOG MIX 70/30 FLEXPEN [place] Inject into the skin in the morning, at noon, and at bedtime. Inject 14 Units after breakfast, Inject 16 units after dinner, Inject 18 units after lunch.   potassium chloride 10 MEQ tablet Commonly known as: KLOR-CON TAKE 3 TABLETS BY MOUTH DAILY   RA SALINE ENEMA RE Place rectally as needed.       Review of Systems  Unable to perform ROS: Dementia    Immunization History  Administered Date(s) Administered  . Fluad Quad(high Dose 65+) 08/02/2019  . Influenza Split 09/01/2012  . Influenza Whole 09/18/2002, 08/27/2007, 08/29/2008, 09/10/2010  . Influenza,inj,Quad PF,6+ Mos 07/28/2013, 10/17/2014, 08/21/2015, 10/18/2016, 08/15/2017  . Influenza-Unspecified 09/18/2018  . Janssen (J&J) SARS-COV-2 Vaccination 02/24/2020  . Pneumococcal Conjugate-13 06/10/2014  . Pneumococcal Polysaccharide-23 08/18/1996, 06/19/2010  . Td 10/31/1998, 01/26/2009  . Zoster 09/01/2012   Pertinent  Health Maintenance Due  Topic Date Due  . OPHTHALMOLOGY EXAM  01/07/2020  . INFLUENZA VACCINE  06/18/2020  . FOOT EXAM  08/01/2020  . HEMOGLOBIN A1C  02/03/2021  . PNA vac Low Risk Adult  Completed   Fall Risk  05/01/2020 05/01/2020 04/23/2019 04/20/2018 04/25/2017  Falls in the past year? 0 0 0 Yes Yes  Number falls in past yr: 0 0 - 2 or more 2 or more  Injury with Fall? 0 0 - Yes No  Risk Factor Category  - - - High Fall Risk High Fall Risk  Risk for fall due to : - Impaired balance/gait;Impaired mobility - - Impaired balance/gait;Impaired mobility  Follow up - Falls evaluation completed - - Falls prevention discussed   Functional Status Survey:    Vitals:   09/25/20 1330  BP: 135/71  Pulse:  74  Resp: (!) 24  Temp: (!) 97.5 F (36.4 C)  Weight: 166 lb (75.3 kg)  Height: 5\' 11"  (1.803 m)   Body mass index is 23.15 kg/m. Physical Exam Vitals and nursing note reviewed.  Constitutional:      General: He is not in acute distress.    Appearance: He is normal weight. He is not ill-appearing.  HENT:     Head: Normocephalic.     Nose: Nose normal. No congestion or rhinorrhea.     Mouth/Throat:     Mouth: Mucous membranes are moist.     Pharynx: Oropharynx is clear. No oropharyngeal exudate or posterior oropharyngeal erythema.  Eyes:     General: No scleral icterus.       Right eye: No discharge.        Left eye: No discharge.  Conjunctiva/sclera: Conjunctivae normal.     Pupils: Pupils are equal, round, and reactive to light.  Cardiovascular:     Rate and Rhythm: Normal rate and regular rhythm.     Heart sounds: Normal heart sounds. No murmur heard.  No friction rub. No gallop.   Pulmonary:     Effort: Pulmonary effort is normal. No respiratory distress.     Breath sounds: Normal breath sounds. No wheezing, rhonchi or rales.  Chest:     Chest wall: No tenderness.  Abdominal:     General: Bowel sounds are normal. There is no distension.     Palpations: Abdomen is soft. There is no mass.     Tenderness: There is no abdominal tenderness. There is no guarding or rebound.  Musculoskeletal:        General: No swelling or tenderness.     Right lower leg: No edema.     Left lower leg: No edema.     Comments: Left hand weakness self propels using right hand/leg   Skin:    General: Skin is warm and dry.     Coloration: Skin is not pale.     Findings: No bruising, erythema, lesion or rash.  Neurological:     Motor: Weakness present.     Gait: Gait abnormal.  Psychiatric:        Behavior: Behavior is agitated.        Cognition and Memory: Memory is impaired.     Comments: Dysarthria     Labs reviewed: Recent Labs    08/07/20 1918 08/08/20 0026 08/09/20 0246  08/09/20 0246 08/10/20 2426 08/10/20 8341 08/11/20 0553 08/11/20 0553 08/12/20 0540 08/13/20 0548 08/21/20 0000  NA 153*   < > 144   < > 149*   < > 146*   < > 146* 142 138  K 4.5   < > 3.3*   < > 3.4*   < > 3.7   < > 3.8 3.9 4.7  CL 109   < > 106   < > 110   < > 108   < > 108 110 102  CO2 25   < > 27   < > 27   < > 26   < > 26 22 25*  GLUCOSE 108*   < > 174*   < > 60*   < > 171*  --  221* 161*  --   BUN 28*   < > 18   < > 26*   < > 25*   < > 25* 24* 10  CREATININE 1.26*   < > 1.21   < > 1.40*   < > 1.33*   < > 1.34* 1.04 0.8  CALCIUM 8.9   < > 7.8*   < > 8.0*   < > 7.7*   < > 7.9* 7.6* 8.5*  MG 2.6*  --  2.2  --  2.6*  --   --   --   --   --   --    < > = values in this interval not displayed.   Recent Labs    08/10/20 0452 08/10/20 0452 08/11/20 0553 08/12/20 0540 08/21/20 0000  AST 89*   < > 129* 109* 23  ALT 36   < > 52* 54* 21  ALKPHOS 116  --  139* 146*  --   BILITOT 1.4*  --  1.2 1.1  --   PROT 6.5  --  6.3* 6.4*  --  ALBUMIN 2.3*  --  2.2* 2.1*  --    < > = values in this interval not displayed.   Recent Labs    08/11/20 0553 08/11/20 0553 08/12/20 0540 08/13/20 0548 08/21/20 0000  WBC 10.4   < > 10.1 10.7* 9.7  NEUTROABS 7.1  --  7.0 7.8*  --   HGB 11.9*   < > 11.3* 11.6* 11.8*  HCT 34.3*   < > 33.1* 33.2* 36*  MCV 73.8*  --  74.0* 73.5*  --   PLT 165   < > 193 230 435*   < > = values in this interval not displayed.   Lab Results  Component Value Date   TSH 1.369 08/07/2020   Lab Results  Component Value Date   HGBA1C 7.7 (H) 08/06/2020   Lab Results  Component Value Date   CHOL 104 05/01/2020   HDL 51.80 05/01/2020   LDLCALC 43 05/01/2020   LDLDIRECT 49.7 09/01/2012   TRIG 48.0 05/01/2020   CHOLHDL 2 05/01/2020    Significant Diagnostic Results in last 30 days:  No results found.  Assessment/Plan   Agitation Afebrile. Increased confusion,combative,wandering on hallways and removing clothes. - will obtain stat CB/diff and BMP  -  urine specimen for U/A and C/S to rule out UTI  - continue to monitor Addendum: 09/28/2020  U/A results showed yellow turbid urine with negative nitrites,500 leuks,3+ Blood  Culture showed > 100,000 colonies of gram positive cocci sensitivity to Cipro.Also shows > 100,000 colonies of yeast  Diflucan 150 mg tablet one by mouth x 1 dose then repeat 1 dose in one week.   Start Cipro 500 mg tablet one by mouth twice daily x 7 days along with Florastor 250 mg capsule one by mouth twice daily x 10 days.   Family/ staff Communication: Reviewed plan of care with patient and facility Nurse  Labs/tests ordered:   - stat CB/diff and BMP  - urine specimen for U/A and C/S to rule out UTI

## 2020-09-26 DIAGNOSIS — N390 Urinary tract infection, site not specified: Secondary | ICD-10-CM | POA: Diagnosis not present

## 2020-09-28 ENCOUNTER — Other Ambulatory Visit: Payer: Self-pay | Admitting: *Deleted

## 2020-09-28 NOTE — Patient Outreach (Signed)
[facility] Post-Acute Care Coordinator follow up. Member screened for potential [facility] Care Management needs as a benefit of [employer].  Verified in Patient Marcus Downs that Marcus Downs transitioned to private pay at [facility]. Will confirm with SNF SW. Otherwise, there are no identifiable [facility] Care Management needs at this time.    Raiford Noble, MSN-Ed, RN,BSN [facility] Post Acute Care Coordinator [phone] [facility]) [phone]  (Toll free office)

## 2020-09-29 ENCOUNTER — Other Ambulatory Visit: Payer: Self-pay | Admitting: *Deleted

## 2020-09-29 NOTE — Patient Outreach (Signed)
[facility] Post- Acute Care Coordinator follow up. Member screened for potential [facility] Care Management needs as a benefit of [employer].  Update received from [facility] SW indicating member has transitioned to long term care at [facility].   No identifiable [facility] Care Management needs at this time.    Raiford Noble, MSN-Ed, RN,BSN [facility] Post Acute Care Coordinator [phone] [facility]) [phone]  (Toll free office)

## 2020-10-10 DIAGNOSIS — I69354 Hemiplegia and hemiparesis following cerebral infarction affecting left non-dominant side: Secondary | ICD-10-CM | POA: Diagnosis not present

## 2020-10-10 DIAGNOSIS — R2681 Unsteadiness on feet: Secondary | ICD-10-CM | POA: Diagnosis not present

## 2020-10-10 DIAGNOSIS — S72002D Fracture of unspecified part of neck of left femur, subsequent encounter for closed fracture with routine healing: Secondary | ICD-10-CM | POA: Diagnosis not present

## 2020-10-11 DIAGNOSIS — R2681 Unsteadiness on feet: Secondary | ICD-10-CM | POA: Diagnosis not present

## 2020-10-11 DIAGNOSIS — I69354 Hemiplegia and hemiparesis following cerebral infarction affecting left non-dominant side: Secondary | ICD-10-CM | POA: Diagnosis not present

## 2020-10-11 DIAGNOSIS — S72002D Fracture of unspecified part of neck of left femur, subsequent encounter for closed fracture with routine healing: Secondary | ICD-10-CM | POA: Diagnosis not present

## 2020-10-12 DIAGNOSIS — R2681 Unsteadiness on feet: Secondary | ICD-10-CM | POA: Diagnosis not present

## 2020-10-12 DIAGNOSIS — S72002D Fracture of unspecified part of neck of left femur, subsequent encounter for closed fracture with routine healing: Secondary | ICD-10-CM | POA: Diagnosis not present

## 2020-10-12 DIAGNOSIS — I69354 Hemiplegia and hemiparesis following cerebral infarction affecting left non-dominant side: Secondary | ICD-10-CM | POA: Diagnosis not present

## 2020-10-13 DIAGNOSIS — S72002D Fracture of unspecified part of neck of left femur, subsequent encounter for closed fracture with routine healing: Secondary | ICD-10-CM | POA: Diagnosis not present

## 2020-10-13 DIAGNOSIS — R2681 Unsteadiness on feet: Secondary | ICD-10-CM | POA: Diagnosis not present

## 2020-10-13 DIAGNOSIS — I69354 Hemiplegia and hemiparesis following cerebral infarction affecting left non-dominant side: Secondary | ICD-10-CM | POA: Diagnosis not present

## 2020-10-16 ENCOUNTER — Encounter: Payer: Self-pay | Admitting: Orthopedic Surgery

## 2020-10-16 ENCOUNTER — Non-Acute Institutional Stay (SKILLED_NURSING_FACILITY): Payer: Medicare Other | Admitting: Orthopedic Surgery

## 2020-10-16 DIAGNOSIS — I69354 Hemiplegia and hemiparesis following cerebral infarction affecting left non-dominant side: Secondary | ICD-10-CM | POA: Diagnosis not present

## 2020-10-16 DIAGNOSIS — S72002D Fracture of unspecified part of neck of left femur, subsequent encounter for closed fracture with routine healing: Secondary | ICD-10-CM | POA: Diagnosis not present

## 2020-10-16 DIAGNOSIS — R2681 Unsteadiness on feet: Secondary | ICD-10-CM | POA: Diagnosis not present

## 2020-10-16 DIAGNOSIS — R4182 Altered mental status, unspecified: Secondary | ICD-10-CM

## 2020-10-16 NOTE — Progress Notes (Signed)
Location:    [facility] Room Number: 100/P Place of Service:  SNF [phone]) Provider:  Melida Northington Antonieta PertFargo AGNP-C  Eustaquio BoydenGutierrez, Javier, MD  Patient Care Team: Eustaquio BoydenGutierrez, Javier, MD as PCP - General Jens Somrenshaw, Madolyn FriezeBrian S, MD as PCP - Cardiology (Cardiology) Chalmers GuestWhitaker, Roy, MD as Consulting Physician (Ophthalmology)  Extended Emergency Contact Information Primary Emergency Contact: IdealBoyd,Cassandra Address: [address]          [place], [place] [address] [place] Home Phone: [phone] Relation: Sister  Code Status:  Full Code Goals of care: Advanced Directive information Advanced Directives 10/16/2020  Does Patient Have a Medical Advance Directive? Yes  Type of Advance Directive -  Does patient want to make changes to medical advance directive? No - Patient declined  Would patient like information on creating a medical advance directive? -     Chief Complaint  Patient presents with  . Acute Visit    Confusion    HPI:  Pt is a 79 y.o. male seen today for an acute visit for confusion. He is a resident of [facility], seen today at bedside. 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 Facility nurse reports fever of 100.1 with increased confusion. He was given 650 mg Tylenol for fever. Fever came down to 97.5.  She also claims he is more tired than normal and has not gotten out of bed today. Normally, he is in his wheelchair and can be seen going up and down the halls.   Today, he is easily aroused by voice. When asked a question he does not make eye contact and is soft spoken with "yes" or "no" answers only. When asked where he is , he stated "[place]." During our encounter he moved in bed often, using his legs and arms to readjust.   On 10/13/2020 he was found laying on the floor mat next to his bed at 02:30 AM. Staff reported no visible injuries and started routine neuro  checks, on call NP was notified.   Indwelling foley catheter in place. Last changed 09/14/2020. Ordered to be changed every 28 days, not changed yet. Catheter bag ordered to be changed every 2 weeks, cannot find documentation when it was last done. Treated with cipro x 7 days starting 09/28/20 for UTI due to agitation  He received Moderna booster vaccination 10/05/2020.      Past Medical History:  Diagnosis Date  . CRI (chronic renal insufficiency)   . 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 Diabetes mellitus type II 1992  . HLD (hyperlipidemia) 10/1998  . HTN (hypertension) 10/1998  . Primary open angle glaucoma    [name]  . RBBB   . Stroke [facility](H[facility])    Left sided weakness   Past Surgical History:  Procedure Laterality Date  . CATARACT EXTRACTION W/PHACO  09/09/2012   Procedure: CATARACT EXTRACTION PHACO AND INTRAOCULAR LENS PLACEMENT (IOC);  Surgeon: Chalmers Guestoy [name], MD;  [facility];  Service: Ophthalmology;  Laterality: Right;  . CATARACT EXTRACTION W/PHACO Left 03/31/2013   Procedure: CATARACT EXTRACTION PHACO AND INTRAOCULAR LENS PLACEMENT (IOC);  Surgeon: Chalmers Guestoy [name], MD;  [facility];  Service: Ophthalmology;  Laterality: Left;  . EYE SURGERY    . HIP ARTHROPLASTY Left 08/07/2020   Procedure: ARTHROPLASTY BIPOLAR HIP (HEMIARTHROPLASTY);  Surgeon: Jodi GeraldsGraves, John, MD;  Location: [facility] ORS;  Service: Orthopedics;  Laterality: Left;  . lipoma removal  1970's   Right shoulder  . US ECHOCARDIOGRAPHY  03/2013   mod LVH, EF 60%, normal wall motion    Allergies  Allergen Reactions  . Tape Rash    Burns    Allergies as of 10/16/2020      Reactions   Tape Rash   Burns      Medication List       Accurate as of October 16, 2020  4:14 PM. If you have any questions, ask your nurse or doctor.        acetaminophen 325 MG tablet Commonly known as: TYLENOL Take 650 mg by mouth every 6 (six) hours as needed for mild pain or fever. What  changed: Another medication with the same name was removed. Continue taking this medication, and follow the directions you see here. Changed by: Octavia Heir, NP   aspirin 325 MG tablet Take 325 mg by mouth daily.   atorvastatin 40 MG tablet Commonly known as: LIPITOR Take 1 tablet (40 mg total) by mouth daily.   bisacodyl 10 MG suppository Commonly known as: DULCOLAX Place 10 mg rectally as needed for moderate constipation.   brimonidine 0.1 % Soln Commonly known as: ALPHAGAN P Place 1 drop into both eyes 3 (three) times daily.   diclofenac sodium 1 % Gel Commonly known as: VOLTAREN Apply 1 application topically 3 (three) times daily as needed.   feeding supplement (PRO-STAT 64) Liqd Take 30 mLs by mouth in the morning and at bedtime. For wound healing   ferrous sulfate 325 (65 FE) MG tablet Take 325 mg by mouth daily.   latanoprost 0.005 % ophthalmic solution Commonly known as: XALATAN Place 1 drop into both eyes at bedtime.   magnesium hydroxide 400 MG/5ML suspension Commonly known as: MILK OF MAGNESIA Take by mouth daily as needed for mild constipation.   metoprolol tartrate 25 MG tablet Commonly known as: LOPRESSOR Take 1 tablet (25 mg total) by mouth 2 (two) times daily.   MULTIPLE VITAMIN PO Take 1 tablet by mouth daily.   NON FORMULARY Medpass TID d/t weight loss.   NOVOLOG MIX 70/30 FLEXPEN [place] Inject into the skin in the morning, at noon, and at bedtime. Inject 14 Units after breakfast, Inject 16 units after dinner, Inject 18 units after lunch.   potassium chloride 10 MEQ tablet Commonly known as: KLOR-CON TAKE 3 TABLETS BY MOUTH DAILY   RA SALINE ENEMA RE Place rectally as needed.       Review of Systems  Unable to perform ROS: Dementia    Immunization History  Administered Date(s) Administered  . Fluad Quad(high Dose 65+) 08/02/2019  . Influenza Split 09/01/2012  . Influenza Whole 09/18/2002, 08/27/2007, 08/29/2008, 09/10/2010  .  Influenza,inj,Quad PF,6+ Mos 07/28/2013, 10/17/2014, 08/21/2015, 10/18/2016, 08/15/2017  . Influenza-Unspecified 09/18/2018  . Janssen (J&J) SARS-COV-2 Vaccination 02/24/2020  . Moderna SARS-COVID-2 Vaccination 10/05/2020  . Pneumococcal Conjugate-13 06/10/2014  . Pneumococcal Polysaccharide-23 08/18/1996, 06/19/2010  . Td 10/31/1998, 01/26/2009  . Zoster 09/01/2012   Pertinent  Health Maintenance Due  Topic Date Due  . OPHTHALMOLOGY EXAM  01/07/2020  . INFLUENZA VACCINE  06/18/2020  . HEMOGLOBIN A1C  02/03/2021  . FOOT EXAM  05/29/2021  . PNA vac Low Risk Adult  Completed   Fall Risk  05/01/2020 05/01/2020 04/23/2019 04/20/2018 04/25/2017  Falls in the past year? 0 0 0 Yes Yes  Number falls in past yr: 0 0 - 2 or more 2 or more  Injury with Fall? 0 0 - Yes  No  Risk Factor Category  - - - High Fall Risk High Fall Risk  Risk for fall due to : - Impaired balance/gait;Impaired mobility - - Impaired balance/gait;Impaired mobility  Follow up - Falls evaluation completed - - Falls prevention discussed   Functional Status Survey:    Vitals:   10/16/20 1604  BP: (!) 98/50  Pulse: 73  Resp: 18  Temp: 100.1 F (37.8 C)  Weight: 155 lb 12.8 oz (70.7 kg)  Height: 6' (1.829 m)   Body mass index is 21.13 kg/m. Physical Exam Vitals reviewed.  Constitutional:      General: He is not in acute distress.    Appearance: Normal appearance.     Comments: Appears fatigued  HENT:     Head: Normocephalic.     Right Ear: There is no impacted cerumen.     Left Ear: There is no impacted cerumen.     Nose: Nose normal.     Mouth/Throat:     Mouth: Mucous membranes are moist.     Pharynx: No posterior oropharyngeal erythema.  Cardiovascular:     Rate and Rhythm: Normal rate and regular rhythm.     Pulses: Normal pulses.     Heart sounds: Normal heart sounds. No murmur heard.   Pulmonary:     Effort: Pulmonary effort is normal. No respiratory distress.     Breath sounds: Normal breath sounds.  No wheezing.  Abdominal:     General: Bowel sounds are normal.     Palpations: Abdomen is soft.  Genitourinary:    Comments: Foley catheter in place, urine yellow with sediment Musculoskeletal:     Right lower leg: No edema.     Left lower leg: No edema.     Comments: Left sided weakness present   Lymphadenopathy:     Cervical: No cervical adenopathy.  Skin:    General: Skin is warm and dry.     Capillary Refill: Capillary refill takes less than 2 seconds.  Neurological:     Mental Status: He is disoriented.     Motor: Weakness present.     Gait: Gait abnormal.  Psychiatric:        Cognition and Memory: Memory is impaired.     Labs reviewed: Recent Labs    08/07/20 1918 08/08/20 0026 08/09/20 0246 08/09/20 0246 08/10/20 5284 08/10/20 1324 08/11/20 0553 08/11/20 0553 08/12/20 0540 08/12/20 0540 08/13/20 0548 08/21/20 0000 09/25/20 0000  NA 153*   < > 144   < > 149*   < > 146*   < > 146*   < > 142 138 142  K 4.5   < > 3.3*   < > 3.4*   < > 3.7   < > 3.8   < > 3.9 4.7 4.3  CL 109   < > 106   < > 110   < > 108   < > 108   < > 110 102 100  CO2 25   < > 27   < > 27   < > 26   < > 26   < > 22 25* 23*  GLUCOSE 108*   < > 174*   < > 60*   < > 171*  --  221*  --  161*  --   --   BUN 28*   < > 18   < > 26*   < > 25*   < > 25*   < > 24* 10 9  CREATININE 1.26*   < > 1.21   < > 1.40*   < > 1.33*   < > 1.34*   < > 1.04 0.8 1.1  CALCIUM 8.9   < > 7.8*   < > 8.0*   < > 7.7*   < > 7.9*   < > 7.6* 8.5* 9.7  MG 2.6*  --  2.2  --  2.6*  --   --   --   --   --   --   --   --    < > = values in this interval not displayed.   Recent Labs    08/10/20 0452 08/10/20 0452 08/11/20 0553 08/12/20 0540 08/21/20 0000  AST 89*   < > 129* 109* 23  ALT 36   < > 52* 54* 21  ALKPHOS 116  --  139* 146*  --   BILITOT 1.4*  --  1.2 1.1  --   PROT 6.5  --  6.3* 6.4*  --   ALBUMIN 2.3*  --  2.2* 2.1*  --    < > = values in this interval not displayed.   Recent Labs    08/11/20 0553  08/11/20 0553 08/12/20 0540 08/12/20 0540 08/13/20 0548 08/21/20 0000 09/25/20 0000  WBC 10.4   < > 10.1   < > 10.7* 9.7 10.5  NEUTROABS 7.1  --  7.0  --  7.8*  --   --   HGB 11.9*   < > 11.3*   < > 11.6* 11.8* 14.6  HCT 34.3*   < > 33.1*   < > 33.2* 36* 43  MCV 73.8*  --  74.0*  --  73.5*  --   --   PLT 165   < > 193   < > 230 435* 383   < > = values in this interval not displayed.   Lab Results  Component Value Date   TSH 1.369 08/07/2020   Lab Results  Component Value Date   HGBA1C 7.7 (H) 08/06/2020   Lab Results  Component Value Date   CHOL 104 05/01/2020   HDL 51.80 05/01/2020   LDLCALC 43 05/01/2020   LDLDIRECT 49.7 09/01/2012   TRIG 48.0 05/01/2020   CHOLHDL 2 05/01/2020    Significant Diagnostic Results in last 30 days:  No results found.  Assessment/Plan 1. Altered mental status, unspecified altered mental status type - ongoing, suspect infection since he has a fever >100 - today he is easily arousable, but appears fatigued, urine yellow with sediment - he has a long history of UTI related to his indwelling catheter, last treated with Cipro about 12 days ago - will follow up with facility nurse on indwelling catheter and bag changes- no documentation noted - recommend daily perineal care to prevent infection - will order U/A and culture - will order rapid covid- 19 test     Family/ staff Communication: Plan discussed with facility nurse  Labs/tests ordered:  UA, urine culture, rapid covid-19 test

## 2020-10-17 DIAGNOSIS — R2681 Unsteadiness on feet: Secondary | ICD-10-CM | POA: Diagnosis not present

## 2020-10-17 DIAGNOSIS — I69354 Hemiplegia and hemiparesis following cerebral infarction affecting left non-dominant side: Secondary | ICD-10-CM | POA: Diagnosis not present

## 2020-10-17 DIAGNOSIS — S72002D Fracture of unspecified part of neck of left femur, subsequent encounter for closed fracture with routine healing: Secondary | ICD-10-CM | POA: Diagnosis not present

## 2020-10-17 DIAGNOSIS — N390 Urinary tract infection, site not specified: Secondary | ICD-10-CM | POA: Diagnosis not present

## 2020-10-18 DIAGNOSIS — R2681 Unsteadiness on feet: Secondary | ICD-10-CM | POA: Diagnosis not present

## 2020-10-18 DIAGNOSIS — I69354 Hemiplegia and hemiparesis following cerebral infarction affecting left non-dominant side: Secondary | ICD-10-CM | POA: Diagnosis not present

## 2020-10-18 DIAGNOSIS — S72002D Fracture of unspecified part of neck of left femur, subsequent encounter for closed fracture with routine healing: Secondary | ICD-10-CM | POA: Diagnosis not present

## 2020-10-19 ENCOUNTER — Encounter: Payer: Self-pay | Admitting: Orthopedic Surgery

## 2020-10-19 ENCOUNTER — Non-Acute Institutional Stay (SKILLED_NURSING_FACILITY): Payer: Medicare Other | Admitting: Orthopedic Surgery

## 2020-10-19 DIAGNOSIS — N390 Urinary tract infection, site not specified: Secondary | ICD-10-CM | POA: Diagnosis not present

## 2020-10-19 DIAGNOSIS — T83511S Infection and inflammatory reaction due to indwelling urethral catheter, sequela: Secondary | ICD-10-CM

## 2020-10-19 DIAGNOSIS — R2681 Unsteadiness on feet: Secondary | ICD-10-CM | POA: Diagnosis not present

## 2020-10-19 DIAGNOSIS — S72002D Fracture of unspecified part of neck of left femur, subsequent encounter for closed fracture with routine healing: Secondary | ICD-10-CM | POA: Diagnosis not present

## 2020-10-19 DIAGNOSIS — R4182 Altered mental status, unspecified: Secondary | ICD-10-CM

## 2020-10-19 DIAGNOSIS — I69354 Hemiplegia and hemiparesis following cerebral infarction affecting left non-dominant side: Secondary | ICD-10-CM | POA: Diagnosis not present

## 2020-10-19 NOTE — Progress Notes (Signed)
Location:    [name]: 100/P Place of Service:  SNF [phone]) Provider: [name]   [name], MD  Patient Care Team: [name], MD as PCP - General [name], MD as PCP - Cardiology (Cardiology) [name], MD as Consulting Physician (Ophthalmology)  Extended Emergency Contact Information Primary Emergency Contact: [place] Address: [address]          [place], [place] [address] [place] Home Phone: [phone] Relation: Sister  Code Status: Full Code  Goals of care: Advanced Directive information Advanced Directives 10/19/2020  Does Patient Have a Medical Advance Directive? Yes  Type of Advance Directive -  Does patient want to make changes to medical advance directive? No - Patient declined  Would patient like information on creating a medical advance directive? -     Chief Complaint  Patient presents with  . Acute Visit    UTI    HPI:  Pt is a 79 y.o. male seen today for an acute visit for urinary tract infection.   He is a resident of [facility], seen at bedside today. PMH includes: hypertension, type 2 diabetes mellitus, dysphagia, renal insufficiency, BPH, and weight loss.   Two days ago, facility nurse reported he was running a fever of 100.1 and confused. He was given tylenol for fever and urine analysis with culture was completed.   Today his nurse claims his confusion has decreased. He remains afebrile with PRN tylenol. During encounter he mumbles words and will not open his eyes. He appears very tired.        Past Medical History:  Diagnosis Date  . CRI (chronic renal insufficiency)   . 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 Diabetes mellitus type II 1992  . HLD (hyperlipidemia) 10/1998  . HTN (hypertension) 10/1998  . Primary open angle glaucoma    [name]  . RBBB   . Stroke [facility])    Left sided  weakness   Past Surgical History:  Procedure Laterality Date  . CATARACT EXTRACTION W/PHACO  09/09/2012   Procedure: CATARACT EXTRACTION PHACO AND INTRAOCULAR LENS PLACEMENT (IOC);  Surgeon: [name], MD;  Location: [facility] OR;  Service: Ophthalmology;  Laterality: Right;  . CATARACT EXTRACTION W/PHACO Left 03/31/2013   Procedure: CATARACT EXTRACTION PHACO AND INTRAOCULAR LENS PLACEMENT (IOC);  Surgeon: [name], MD;  Location: [facility] OR;  Service: Ophthalmology;  Laterality: Left;  . EYE SURGERY    . HIP ARTHROPLASTY Left 08/07/2020   Procedure: ARTHROPLASTY BIPOLAR HIP (HEMIARTHROPLASTY);  Surgeon: [name], MD;  Location: [facility] ORS;  Service: Orthopedics;  Laterality: Left;  . lipoma removal  1970's   Right shoulder  . US ECHOCARDIOGRAPHY  03/2013   mod LVH, EF 60%, normal wall motion    Allergies  Allergen Reactions  . Tape Rash    Burns    Allergies as of 10/19/2020      Reactions   Tape Rash   Burns      Medication List       Accurate as of October 19, 2020 11:56 AM. If you have any questions, ask your nurse or doctor.        STOP taking these medications   diclofenac sodium 1 % Gel Commonly known as: VOLTAREN Stopped by: Octavia Heir, NP     TAKE these medications   acetaminophen 325 MG tablet Commonly known as: TYLENOL Take 650 mg  by mouth every 6 (six) hours as needed for mild pain or fever.   aspirin 325 MG tablet Take 325 mg by mouth daily.   atorvastatin 40 MG tablet Commonly known as: LIPITOR Take 1 tablet (40 mg total) by mouth daily.   bisacodyl 10 MG suppository Commonly known as: DULCOLAX Place 10 mg rectally as needed for moderate constipation.   brimonidine 0.1 % Soln Commonly known as: ALPHAGAN P Place 1 drop into both eyes 3 (three) times daily.   feeding supplement (PRO-STAT 64) Liqd Take 30 mLs by mouth in the morning and at bedtime. For wound healing   ferrous sulfate 325 (65 FE) MG tablet Take 325 mg by mouth daily.   latanoprost  0.005 % ophthalmic solution Commonly known as: XALATAN Place 1 drop into both eyes at bedtime.   magnesium hydroxide 400 MG/5ML suspension Commonly known as: MILK OF MAGNESIA Take by mouth daily as needed for mild constipation.   metoprolol tartrate 25 MG tablet Commonly known as: LOPRESSOR Take 1 tablet (25 mg total) by mouth 2 (two) times daily.   MULTIPLE VITAMIN PO Take 1 tablet by mouth daily.   NON FORMULARY 120mL Medpass TID d/t weight loss.   NOVOLOG MIX 70/30 FLEXPEN  Inject into the skin in the morning, at noon, and at bedtime. Inject 14 Units after breakfast, Inject 16 units after dinner, Inject 18 units after lunch.   potassium chloride 10 MEQ tablet Commonly known as: KLOR-CON TAKE 3 TABLETS BY MOUTH DAILY   RA SALINE ENEMA RE Place rectally as needed.       Review of Systems  Unable to perform ROS: Dementia    Immunization History  Administered Date(s) Administered  . Fluad Quad(high Dose 65+) 08/02/2019  . Influenza Split 09/01/2012  . Influenza Whole 09/18/2002, 08/27/2007, 08/29/2008, 09/10/2010  . Influenza,inj,Quad PF,6+ Mos 07/28/2013, 10/17/2014, 08/21/2015, 10/18/2016, 08/15/2017  . Influenza-Unspecified 09/18/2018  . Janssen (J&J) SARS-COV-2 Vaccination 02/24/2020  . Moderna SARS-COVID-2 Vaccination 10/05/2020  . Pneumococcal Conjugate-13 06/10/2014  . Pneumococcal Polysaccharide-23 08/18/1996, 06/19/2010  . Td 10/31/1998, 01/26/2009  . Zoster 09/01/2012   Pertinent  Health Maintenance Due  Topic Date Due  . OPHTHALMOLOGY EXAM  01/07/2020  . INFLUENZA VACCINE  06/18/2020  . HEMOGLOBIN A1C  02/03/2021  . FOOT EXAM  05/29/2021  . PNA vac Low Risk Adult  Completed   Fall Risk  05/01/2020 05/01/2020 04/23/2019 04/20/2018 04/25/2017  Falls in the past year? 0 0 0 Yes Yes  Number falls in past yr: 0 0 - 2 or more 2 or more  Injury with Fall? 0 0 - Yes No  Risk Factor Category  - - - High Fall Risk High Fall Risk  Risk for fall due to : -  Impaired balance/gait;Impaired mobility - - Impaired balance/gait;Impaired mobility  Follow up - Falls evaluation completed - - Falls prevention discussed   Functional Status Survey:    Vitals:   10/19/20 1150  BP: 135/73  Pulse: 88  Resp: 19  Temp: 97.9 F (36.6 C)  Weight: 155 lb 12.8 oz (70.7 kg)  Height: 6' (1.829 m)   Body mass index is 21.13 kg/m. Physical Exam Vitals reviewed.  Constitutional:      General: He is not in acute distress.    Appearance: He is diaphoretic.  Cardiovascular:     Rate and Rhythm: Normal rate and regular rhythm.     Pulses: Normal pulses.     Heart sounds: Normal heart sounds. No murmur heard.   Pulmonary:  Effort: Pulmonary effort is normal. No respiratory distress.     Breath sounds: Normal breath sounds. No wheezing.  Abdominal:     General: Abdomen is flat. Bowel sounds are normal. There is no distension.     Palpations: Abdomen is soft.     Tenderness: There is no abdominal tenderness.  Genitourinary:    Comments: Urine yellow with sediment  Skin:    General: Skin is warm.     Capillary Refill: Capillary refill takes less than 2 seconds.  Neurological:     Mental Status: He is disoriented.     Motor: Weakness present.     Gait: Gait abnormal.  Psychiatric:        Attention and Perception: He is inattentive.        Mood and Affect: Affect is flat.        Cognition and Memory: Memory is impaired.     Labs reviewed: Recent Labs    08/07/20 1918 08/08/20 0026 08/09/20 0246 08/09/20 0246 08/10/20 0452 08/10/20 0452 08/11/20 0553 08/11/20 0553 08/12/20 0540 08/12/20 0540 08/13/20 0548 08/21/20 0000 09/25/20 0000  NA 153*   < > 144   < > 149*   < > 146*   < > 146*   < > 142 138 142  K 4.5   < > 3.3*   < > 3.4*   < > 3.7   < > 3.8   < > 3.9 4.7 4.3  CL 109   < > 106   < > 110   < > 108   < > 108   < > 110 102 100  CO2 25   < > 27   < > 27   < > 26   < > 26   < > 22 25* 23*  GLUCOSE 108*   < > 174*   < > 60*   < >  171*  --  221*  --  161*  --   --   BUN 28*   < > 18   < > 26*   < > 25*   < > 25*   < > 24* 10 9  CREATININE 1.26*   < > 1.21   < > 1.40*   < > 1.33*   < > 1.34*   < > 1.04 0.8 1.1  CALCIUM 8.9   < > 7.8*   < > 8.0*   < > 7.7*   < > 7.9*   < > 7.6* 8.5* 9.7  MG 2.6*  --  2.2  --  2.6*  --   --   --   --   --   --   --   --    < > = values in this interval not displayed.   Recent Labs    08/10/20 0452 08/10/20 0452 08/11/20 0553 08/12/20 0540 08/21/20 0000  AST 89*   < > 129* 109* 23  ALT 36   < > 52* 54* 21  ALKPHOS 116  --  139* 146*  --   BILITOT 1.4*  --  1.2 1.1  --   PROT 6.5  --  6.3* 6.4*  --   ALBUMIN 2.3*  --  2.2* 2.1*  --    < > = values in this interval not displayed.   Recent Labs    08/11/20 0553 08/11/20 0553 08/12/20 0540 08/12/20 0540 08/13/20 0548 08/21/20 0000 09/25/20 0000  WBC 10.4   < > 10.1   < >  10.7* 9.7 10.5  NEUTROABS 7.1  --  7.0  --  7.8*  --   --   HGB 11.9*   < > 11.3*   < > 11.6* 11.8* 14.6  HCT 34.3*   < > 33.1*   < > 33.2* 36* 43  MCV 73.8*  --  74.0*  --  73.5*  --   --   PLT 165   < > 193   < > 230 435* 383   < > = values in this interval not displayed.   Lab Results  Component Value Date   TSH 1.369 08/07/2020   Lab Results  Component Value Date   HGBA1C 7.7 (H) 08/06/2020   Lab Results  Component Value Date   CHOL 104 05/01/2020   HDL 51.80 05/01/2020   LDLCALC 43 05/01/2020   LDLDIRECT 49.7 09/01/2012   TRIG 48.0 05/01/2020   CHOLHDL 2 05/01/2020    Significant Diagnostic Results in last 30 days:  No results found.  Assessment/Plan 1. Altered mental status, unspecified altered mental status type - ongoing, still appears fatigued from two days ago - rapid covid-19 negative  2. Urinary tract infection associated with indwelling urethral catheter, sequela - urine culture positive for staph aureus, last foley change 10/15/2020 - will start nitrofurantoin 100 mg BID for 5 days - continue tylenol PRN for  fever     Family/ staff Communication: Plan discussed to patient and facility nurse  Labs/tests ordered:  none

## 2020-10-20 ENCOUNTER — Non-Acute Institutional Stay (SKILLED_NURSING_FACILITY): Payer: Medicare Other | Admitting: Orthopedic Surgery

## 2020-10-20 ENCOUNTER — Encounter: Payer: Self-pay | Admitting: Orthopedic Surgery

## 2020-10-20 DIAGNOSIS — T83511S Infection and inflammatory reaction due to indwelling urethral catheter, sequela: Secondary | ICD-10-CM | POA: Diagnosis not present

## 2020-10-20 DIAGNOSIS — R2681 Unsteadiness on feet: Secondary | ICD-10-CM | POA: Diagnosis not present

## 2020-10-20 DIAGNOSIS — I69354 Hemiplegia and hemiparesis following cerebral infarction affecting left non-dominant side: Secondary | ICD-10-CM | POA: Diagnosis not present

## 2020-10-20 DIAGNOSIS — I10 Essential (primary) hypertension: Secondary | ICD-10-CM | POA: Diagnosis not present

## 2020-10-20 DIAGNOSIS — N390 Urinary tract infection, site not specified: Secondary | ICD-10-CM | POA: Diagnosis not present

## 2020-10-20 DIAGNOSIS — S72002D Fracture of unspecified part of neck of left femur, subsequent encounter for closed fracture with routine healing: Secondary | ICD-10-CM | POA: Diagnosis not present

## 2020-10-20 NOTE — Progress Notes (Signed)
Location:    [name]: 100/P Place of Service:  SNF [phone]) Provider:  Mikya Don Antonieta Pert  [name], MD  Patient Care Team: [name], MD as PCP - General [name], MD as PCP - Cardiology (Cardiology) [name], MD as Consulting Physician (Ophthalmology)  Extended Emergency Contact Information Primary Emergency Contact: [place] Address: [address]          [place], [place] [address] Darden [place] of [place] Home Phone: [phone] Relation: Sister  Code Status:  Full Code Goals of care: Advanced Directive information Advanced Directives 10/20/2020  Does Patient Have a Medical Advance Directive? Yes  Type of Advance Directive -  Does patient want to make changes to medical advance directive? No - Patient declined  Would patient like information on creating a medical advance directive? -     Chief Complaint  Patient presents with  . Acute Visit      Hypotension, lethargic    HPI:  Pt is a 79 y.o. male seen today for an acute visit for increased lethargy.   He is a resident of [facility], seen at bedside today. Past medical history includes: hypertension, type 2 diabetes, GERD, dysphagia, hyperlipidemia, hemiplegia, chronic foley use, and urinary retention.   Yesterday, he was seen for fever related to urinary tract infection. Urine culture positive for staph aureus 10/19/20. Initially nitrofurantoin was started. In addition, he began running a fever of 100.1 on 11/29. He was given 650 mg tylenol and his fever decreased. He remained afebrile until today. Today, facility nurse reports increased lethargy. He opens his eyes to voice or touch. Moans but does not speak. Moves right side of body, but appears weak.   Vital sign taken during encounter, fever 100.4, HR 103, BP 108/68, RR 20.       Past Medical History:  Diagnosis Date  . CRI (chronic renal insufficiency)   . CVA  (cerebral infarction) 1997   Right with residual LUE weakness  . Diabetes mellitus type II 1992  . HLD (hyperlipidemia) 10/1998  . HTN (hypertension) 10/1998  . Primary open angle glaucoma    [name]  . RBBB   . Stroke [facility])    Left sided weakness   Past Surgical History:  Procedure Laterality Date  . CATARACT EXTRACTION W/PHACO  09/09/2012   Procedure: CATARACT EXTRACTION PHACO AND INTRAOCULAR LENS PLACEMENT (IOC);  Surgeon: [name], MD;  Location: [facility] OR;  Service: Ophthalmology;  Laterality: Right;  . CATARACT EXTRACTION W/PHACO Left 03/31/2013   Procedure: CATARACT EXTRACTION PHACO AND INTRAOCULAR LENS PLACEMENT (IOC);  Surgeon: [name], MD;  Location: [facility] OR;  Service: Ophthalmology;  Laterality: Left;  . EYE SURGERY    . HIP ARTHROPLASTY Left 08/07/2020   Procedure: ARTHROPLASTY BIPOLAR HIP (HEMIARTHROPLASTY);  Surgeon: [name], MD;  Location: [facility] ORS;  Service: Orthopedics;  Laterality: Left;  . lipoma removal  1970's   Right shoulder  . US ECHOCARDIOGRAPHY  03/2013   mod LVH, EF 60%, normal wall motion    Allergies  Allergen Reactions  . Tape Rash    Burns    Allergies as of 10/20/2020      Reactions   Tape Rash   Burns      Medication List       Accurate as of October 20, 2020  2:16 PM. If you have any questions, ask your nurse or doctor.        acetaminophen 325 MG tablet  Commonly known as: TYLENOL Take 650 mg by mouth every 6 (six) hours as needed for mild pain or fever.   aspirin 325 MG tablet Take 325 mg by mouth daily.   atorvastatin 40 MG tablet Commonly known as: LIPITOR Take 1 tablet (40 mg total) by mouth daily.   bisacodyl 10 MG suppository Commonly known as: DULCOLAX Place 10 mg rectally as needed for moderate constipation.   brimonidine 0.1 % Soln Commonly known as: ALPHAGAN P Place 1 drop into both eyes 3 (three) times daily.   feeding supplement (PRO-STAT 64) Liqd Take 30 mLs by mouth in the morning and at bedtime. For  wound healing   ferrous sulfate 325 (65 FE) MG tablet Take 325 mg by mouth daily.   latanoprost 0.005 % ophthalmic solution Commonly known as: XALATAN Place 1 drop into both eyes at bedtime.   magnesium hydroxide 400 MG/5ML suspension Commonly known as: MILK OF MAGNESIA Take by mouth daily as needed for mild constipation.   metoprolol tartrate 25 MG tablet Commonly known as: LOPRESSOR Take 1 tablet (25 mg total) by mouth 2 (two) times daily.   MULTIPLE VITAMIN PO Take 1 tablet by mouth daily.   nitrofurantoin 100 MG capsule Commonly known as: MACRODANTIN Take 100 mg by mouth 2 (two) times daily. For UTI   NON FORMULARY Medpass TID d/t weight loss.   NOVOLOG MIX 70/30 FLEXPEN [place] Inject into the skin in the morning, at noon, and at bedtime. Inject 14 Units after breakfast, Inject 16 units after dinner, Inject 18 units after lunch.   potassium chloride 10 MEQ tablet Commonly known as: KLOR-CON TAKE 3 TABLETS BY MOUTH DAILY   RA SALINE ENEMA RE Place rectally as needed.       Review of Systems  Unable to perform ROS: Patient nonverbal    Immunization History  Administered Date(s) Administered  . Fluad Quad(high Dose 65+) 08/02/2019  . Influenza Split 09/01/2012  . Influenza Whole 09/18/2002, 08/27/2007, 08/29/2008, 09/10/2010  . Influenza,inj,Quad PF,6+ Mos 07/28/2013, 10/17/2014, 08/21/2015, 10/18/2016, 08/15/2017  . Influenza-Unspecified 09/18/2018  . Janssen (J&J) SARS-COV-2 Vaccination 02/24/2020  . Moderna SARS-COVID-2 Vaccination 10/05/2020  . Pneumococcal Conjugate-13 06/10/2014  . Pneumococcal Polysaccharide-23 08/18/1996, 06/19/2010  . Td 10/31/1998, 01/26/2009  . Zoster 09/01/2012   Pertinent  Health Maintenance Due  Topic Date Due  . OPHTHALMOLOGY EXAM  01/07/2020  . INFLUENZA VACCINE  06/18/2020  . HEMOGLOBIN A1C  02/03/2021  . FOOT EXAM  05/29/2021  . PNA vac Low Risk Adult  Completed   Fall Risk  05/01/2020 05/01/2020 04/23/2019 04/20/2018  04/25/2017  Falls in the past year? 0 0 0 Yes Yes  Number falls in past yr: 0 0 - 2 or more 2 or more  Injury with Fall? 0 0 - Yes No  Risk Factor Category  - - - High Fall Risk High Fall Risk  Risk for fall due to : - Impaired balance/gait;Impaired mobility - - Impaired balance/gait;Impaired mobility  Follow up - Falls evaluation completed - - Falls prevention discussed   Functional Status Survey:    Vitals:   10/20/20 1403  BP: 108/60  Pulse: (!) 103  Resp: 20  Temp: 98.2 F (36.8 C)  Weight: 155 lb 12.8 oz (70.7 kg)  Height: 6' (1.829 m)   Body mass index is 21.13 kg/m. Physical Exam Vitals reviewed.  Constitutional:      General: He is not in acute distress.    Appearance: He is diaphoretic.  HENT:  Head: Normocephalic.  Cardiovascular:     Rate and Rhythm: Regular rhythm. Tachycardia present.     Pulses: Normal pulses.     Heart sounds: Normal heart sounds. No murmur heard.   Pulmonary:     Effort: No respiratory distress.     Breath sounds: No wheezing.  Abdominal:     General: Bowel sounds are normal.     Palpations: Abdomen is soft.  Genitourinary:    Comments: Urine amber with sediment in foley collection bag Skin:    General: Skin is warm.     Capillary Refill: Capillary refill takes less than 2 seconds.     Comments: Forehead warm  Neurological:     Motor: Weakness present.     Gait: Gait abnormal.  Psychiatric:        Mood and Affect: Affect is flat.        Speech: He is noncommunicative.        Cognition and Memory: Memory is impaired.     Labs reviewed: Recent Labs    08/07/20 1918 08/08/20 0026 08/09/20 0246 08/09/20 0246 08/10/20 0452 08/10/20 0452 08/11/20 0553 08/11/20 0553 08/12/20 0540 08/12/20 0540 08/13/20 0548 08/21/20 0000 09/25/20 0000  NA 153*   < > 144   < > 149*   < > 146*   < > 146*   < > 142 138 142  K 4.5   < > 3.3*   < > 3.4*   < > 3.7   < > 3.8   < > 3.9 4.7 4.3  CL 109   < > 106   < > 110   < > 108   < > 108    < > 110 102 100  CO2 25   < > 27   < > 27   < > 26   < > 26   < > 22 25* 23*  GLUCOSE 108*   < > 174*   < > 60*   < > 171*  --  221*  --  161*  --   --   BUN 28*   < > 18   < > 26*   < > 25*   < > 25*   < > 24* 10 9  CREATININE 1.26*   < > 1.21   < > 1.40*   < > 1.33*   < > 1.34*   < > 1.04 0.8 1.1  CALCIUM 8.9   < > 7.8*   < > 8.0*   < > 7.7*   < > 7.9*   < > 7.6* 8.5* 9.7  MG 2.6*  --  2.2  --  2.6*  --   --   --   --   --   --   --   --    < > = values in this interval not displayed.   Recent Labs    08/10/20 0452 08/10/20 0452 08/11/20 0553 08/12/20 0540 08/21/20 0000  AST 89*   < > 129* 109* 23  ALT 36   < > 52* 54* 21  ALKPHOS 116  --  139* 146*  --   BILITOT 1.4*  --  1.2 1.1  --   PROT 6.5  --  6.3* 6.4*  --   ALBUMIN 2.3*  --  2.2* 2.1*  --    < > = values in this interval not displayed.   Recent Labs    08/11/20 0553 08/11/20 0553 08/12/20 0540  08/12/20 0540 08/13/20 0548 08/21/20 0000 09/25/20 0000  WBC 10.4   < > 10.1   < > 10.7* 9.7 10.5  NEUTROABS 7.1  --  7.0  --  7.8*  --   --   HGB 11.9*   < > 11.3*   < > 11.6* 11.8* 14.6  HCT 34.3*   < > 33.1*   < > 33.2* 36* 43  MCV 73.8*  --  74.0*  --  73.5*  --   --   PLT 165   < > 193   < > 230 435* 383   < > = values in this interval not displayed.   Lab Results  Component Value Date   TSH 1.369 08/07/2020   Lab Results  Component Value Date   HGBA1C 7.7 (H) 08/06/2020   Lab Results  Component Value Date   CHOL 104 05/01/2020   HDL 51.80 05/01/2020   LDLCALC 43 05/01/2020   LDLDIRECT 49.7 09/01/2012   TRIG 48.0 05/01/2020   CHOLHDL 2 05/01/2020    Significant Diagnostic Results in last 30 days:  No results found.  Assessment/Plan 1. Urinary tract infection associated with indwelling urethral catheter, sequela - he is more lethargic than yesterday with fever - will switch antibiotic to bactrim due to fever - Bactrim 80-160 mg PO BID x 10 days - will start IV to administer fluids - give 1  Liter normal saline @ 200/hr - stat CBC/diff, BMP   Family/ staff Communication: Plan discussed with facility nurse.   Labs/tests ordered:  Stat CBC/diff and BMP

## 2020-10-21 ENCOUNTER — Other Ambulatory Visit (HOSPITAL_COMMUNITY): Payer: Medicare Other

## 2020-10-21 ENCOUNTER — Inpatient Hospital Stay (HOSPITAL_COMMUNITY): Payer: Medicare Other

## 2020-10-21 ENCOUNTER — Encounter (HOSPITAL_COMMUNITY): Payer: Self-pay

## 2020-10-21 ENCOUNTER — Inpatient Hospital Stay (HOSPITAL_COMMUNITY)
Admission: EM | Admit: 2020-10-21 | Discharge: 2020-12-19 | DRG: 559 | Disposition: E | Payer: Medicare Other | Source: Skilled Nursing Facility | Attending: Internal Medicine | Admitting: Internal Medicine

## 2020-10-21 ENCOUNTER — Emergency Department (HOSPITAL_COMMUNITY): Payer: Medicare Other

## 2020-10-21 ENCOUNTER — Other Ambulatory Visit: Payer: Self-pay

## 2020-10-21 ENCOUNTER — Telehealth: Payer: Self-pay | Admitting: Adult Health

## 2020-10-21 DIAGNOSIS — Y831 Surgical operation with implant of artificial internal device as the cause of abnormal reaction of the patient, or of later complication, without mention of misadventure at the time of the procedure: Secondary | ICD-10-CM | POA: Diagnosis present

## 2020-10-21 DIAGNOSIS — A419 Sepsis, unspecified organism: Secondary | ICD-10-CM | POA: Diagnosis not present

## 2020-10-21 DIAGNOSIS — E785 Hyperlipidemia, unspecified: Secondary | ICD-10-CM | POA: Diagnosis present

## 2020-10-21 DIAGNOSIS — I4510 Unspecified right bundle-branch block: Secondary | ICD-10-CM | POA: Diagnosis not present

## 2020-10-21 DIAGNOSIS — L0291 Cutaneous abscess, unspecified: Secondary | ICD-10-CM

## 2020-10-21 DIAGNOSIS — R64 Cachexia: Secondary | ICD-10-CM | POA: Diagnosis present

## 2020-10-21 DIAGNOSIS — J320 Chronic maxillary sinusitis: Secondary | ICD-10-CM | POA: Diagnosis not present

## 2020-10-21 DIAGNOSIS — E11649 Type 2 diabetes mellitus with hypoglycemia without coma: Secondary | ICD-10-CM | POA: Diagnosis not present

## 2020-10-21 DIAGNOSIS — R7881 Bacteremia: Secondary | ICD-10-CM | POA: Diagnosis not present

## 2020-10-21 DIAGNOSIS — T8459XA Infection and inflammatory reaction due to other internal joint prosthesis, initial encounter: Secondary | ICD-10-CM

## 2020-10-21 DIAGNOSIS — R2242 Localized swelling, mass and lump, left lower limb: Secondary | ICD-10-CM | POA: Diagnosis not present

## 2020-10-21 DIAGNOSIS — E871 Hypo-osmolality and hyponatremia: Secondary | ICD-10-CM | POA: Diagnosis present

## 2020-10-21 DIAGNOSIS — Z7982 Long term (current) use of aspirin: Secondary | ICD-10-CM

## 2020-10-21 DIAGNOSIS — L02416 Cutaneous abscess of left lower limb: Secondary | ICD-10-CM

## 2020-10-21 DIAGNOSIS — Z515 Encounter for palliative care: Secondary | ICD-10-CM | POA: Diagnosis not present

## 2020-10-21 DIAGNOSIS — Z96642 Presence of left artificial hip joint: Secondary | ICD-10-CM | POA: Diagnosis present

## 2020-10-21 DIAGNOSIS — E875 Hyperkalemia: Secondary | ICD-10-CM

## 2020-10-21 DIAGNOSIS — I69354 Hemiplegia and hemiparesis following cerebral infarction affecting left non-dominant side: Secondary | ICD-10-CM | POA: Diagnosis not present

## 2020-10-21 DIAGNOSIS — R0902 Hypoxemia: Secondary | ICD-10-CM | POA: Diagnosis not present

## 2020-10-21 DIAGNOSIS — E1169 Type 2 diabetes mellitus with other specified complication: Secondary | ICD-10-CM | POA: Diagnosis present

## 2020-10-21 DIAGNOSIS — Z6820 Body mass index (BMI) 20.0-20.9, adult: Secondary | ICD-10-CM

## 2020-10-21 DIAGNOSIS — M25452 Effusion, left hip: Secondary | ICD-10-CM | POA: Diagnosis not present

## 2020-10-21 DIAGNOSIS — N179 Acute kidney failure, unspecified: Secondary | ICD-10-CM | POA: Diagnosis not present

## 2020-10-21 DIAGNOSIS — D509 Iron deficiency anemia, unspecified: Secondary | ICD-10-CM | POA: Diagnosis present

## 2020-10-21 DIAGNOSIS — Z794 Long term (current) use of insulin: Secondary | ICD-10-CM

## 2020-10-21 DIAGNOSIS — F0150 Vascular dementia without behavioral disturbance: Secondary | ICD-10-CM | POA: Diagnosis present

## 2020-10-21 DIAGNOSIS — T8452XA Infection and inflammatory reaction due to internal left hip prosthesis, initial encounter: Secondary | ICD-10-CM | POA: Diagnosis not present

## 2020-10-21 DIAGNOSIS — E872 Acidosis: Secondary | ICD-10-CM | POA: Diagnosis present

## 2020-10-21 DIAGNOSIS — I471 Supraventricular tachycardia: Secondary | ICD-10-CM | POA: Diagnosis not present

## 2020-10-21 DIAGNOSIS — Z96649 Presence of unspecified artificial hip joint: Secondary | ICD-10-CM | POA: Diagnosis not present

## 2020-10-21 DIAGNOSIS — T8459XD Infection and inflammatory reaction due to other internal joint prosthesis, subsequent encounter: Secondary | ICD-10-CM | POA: Diagnosis not present

## 2020-10-21 DIAGNOSIS — E43 Unspecified severe protein-calorie malnutrition: Secondary | ICD-10-CM | POA: Diagnosis not present

## 2020-10-21 DIAGNOSIS — E878 Other disorders of electrolyte and fluid balance, not elsewhere classified: Secondary | ICD-10-CM | POA: Diagnosis not present

## 2020-10-21 DIAGNOSIS — Z91048 Other nonmedicinal substance allergy status: Secondary | ICD-10-CM

## 2020-10-21 DIAGNOSIS — Z781 Physical restraint status: Secondary | ICD-10-CM

## 2020-10-21 DIAGNOSIS — E1165 Type 2 diabetes mellitus with hyperglycemia: Secondary | ICD-10-CM | POA: Diagnosis present

## 2020-10-21 DIAGNOSIS — E876 Hypokalemia: Secondary | ICD-10-CM | POA: Diagnosis not present

## 2020-10-21 DIAGNOSIS — R000 Tachycardia, unspecified: Secondary | ICD-10-CM | POA: Diagnosis not present

## 2020-10-21 DIAGNOSIS — Z66 Do not resuscitate: Secondary | ICD-10-CM | POA: Diagnosis not present

## 2020-10-21 DIAGNOSIS — T83518A Infection and inflammatory reaction due to other urinary catheter, initial encounter: Secondary | ICD-10-CM | POA: Diagnosis not present

## 2020-10-21 DIAGNOSIS — R638 Other symptoms and signs concerning food and fluid intake: Secondary | ICD-10-CM | POA: Diagnosis not present

## 2020-10-21 DIAGNOSIS — Z4659 Encounter for fitting and adjustment of other gastrointestinal appliance and device: Secondary | ICD-10-CM | POA: Diagnosis not present

## 2020-10-21 DIAGNOSIS — N390 Urinary tract infection, site not specified: Secondary | ICD-10-CM | POA: Diagnosis present

## 2020-10-21 DIAGNOSIS — Z20822 Contact with and (suspected) exposure to covid-19: Secondary | ICD-10-CM | POA: Diagnosis not present

## 2020-10-21 DIAGNOSIS — Z7189 Other specified counseling: Secondary | ICD-10-CM | POA: Diagnosis not present

## 2020-10-21 DIAGNOSIS — E861 Hypovolemia: Secondary | ICD-10-CM | POA: Diagnosis present

## 2020-10-21 DIAGNOSIS — I129 Hypertensive chronic kidney disease with stage 1 through stage 4 chronic kidney disease, or unspecified chronic kidney disease: Secondary | ICD-10-CM | POA: Diagnosis present

## 2020-10-21 DIAGNOSIS — N401 Enlarged prostate with lower urinary tract symptoms: Secondary | ICD-10-CM | POA: Diagnosis present

## 2020-10-21 DIAGNOSIS — H401190 Primary open-angle glaucoma, unspecified eye, stage unspecified: Secondary | ICD-10-CM | POA: Diagnosis present

## 2020-10-21 DIAGNOSIS — R7401 Elevation of levels of liver transaminase levels: Secondary | ICD-10-CM | POA: Diagnosis present

## 2020-10-21 DIAGNOSIS — A4101 Sepsis due to Methicillin susceptible Staphylococcus aureus: Secondary | ICD-10-CM | POA: Diagnosis not present

## 2020-10-21 DIAGNOSIS — Y846 Urinary catheterization as the cause of abnormal reaction of the patient, or of later complication, without mention of misadventure at the time of the procedure: Secondary | ICD-10-CM | POA: Diagnosis present

## 2020-10-21 DIAGNOSIS — E1151 Type 2 diabetes mellitus with diabetic peripheral angiopathy without gangrene: Secondary | ICD-10-CM | POA: Diagnosis present

## 2020-10-21 DIAGNOSIS — Z87891 Personal history of nicotine dependence: Secondary | ICD-10-CM

## 2020-10-21 DIAGNOSIS — B9561 Methicillin susceptible Staphylococcus aureus infection as the cause of diseases classified elsewhere: Secondary | ICD-10-CM

## 2020-10-21 DIAGNOSIS — I69311 Memory deficit following cerebral infarction: Secondary | ICD-10-CM

## 2020-10-21 DIAGNOSIS — Z978 Presence of other specified devices: Secondary | ICD-10-CM | POA: Diagnosis not present

## 2020-10-21 DIAGNOSIS — I700 Atherosclerosis of aorta: Secondary | ICD-10-CM | POA: Diagnosis not present

## 2020-10-21 DIAGNOSIS — R627 Adult failure to thrive: Secondary | ICD-10-CM | POA: Diagnosis not present

## 2020-10-21 DIAGNOSIS — Z7401 Bed confinement status: Secondary | ICD-10-CM

## 2020-10-21 DIAGNOSIS — E870 Hyperosmolality and hypernatremia: Secondary | ICD-10-CM | POA: Diagnosis not present

## 2020-10-21 DIAGNOSIS — R0689 Other abnormalities of breathing: Secondary | ICD-10-CM | POA: Diagnosis not present

## 2020-10-21 DIAGNOSIS — Z993 Dependence on wheelchair: Secondary | ICD-10-CM

## 2020-10-21 DIAGNOSIS — E860 Dehydration: Secondary | ICD-10-CM | POA: Diagnosis not present

## 2020-10-21 DIAGNOSIS — N1832 Chronic kidney disease, stage 3b: Secondary | ICD-10-CM | POA: Diagnosis present

## 2020-10-21 DIAGNOSIS — R7989 Other specified abnormal findings of blood chemistry: Secondary | ICD-10-CM | POA: Diagnosis not present

## 2020-10-21 DIAGNOSIS — R6521 Severe sepsis with septic shock: Secondary | ICD-10-CM | POA: Diagnosis not present

## 2020-10-21 DIAGNOSIS — R404 Transient alteration of awareness: Secondary | ICD-10-CM | POA: Diagnosis not present

## 2020-10-21 DIAGNOSIS — G9341 Metabolic encephalopathy: Secondary | ICD-10-CM | POA: Diagnosis present

## 2020-10-21 DIAGNOSIS — G060 Intracranial abscess and granuloma: Secondary | ICD-10-CM | POA: Diagnosis not present

## 2020-10-21 DIAGNOSIS — R54 Age-related physical debility: Secondary | ICD-10-CM | POA: Diagnosis present

## 2020-10-21 DIAGNOSIS — R338 Other retention of urine: Secondary | ICD-10-CM | POA: Diagnosis present

## 2020-10-21 DIAGNOSIS — Z8249 Family history of ischemic heart disease and other diseases of the circulatory system: Secondary | ICD-10-CM

## 2020-10-21 DIAGNOSIS — L89150 Pressure ulcer of sacral region, unstageable: Secondary | ICD-10-CM | POA: Diagnosis present

## 2020-10-21 DIAGNOSIS — Z23 Encounter for immunization: Secondary | ICD-10-CM | POA: Diagnosis not present

## 2020-10-21 DIAGNOSIS — Z4682 Encounter for fitting and adjustment of non-vascular catheter: Secondary | ICD-10-CM | POA: Diagnosis not present

## 2020-10-21 DIAGNOSIS — I69321 Dysphasia following cerebral infarction: Secondary | ICD-10-CM

## 2020-10-21 DIAGNOSIS — I491 Atrial premature depolarization: Secondary | ICD-10-CM | POA: Diagnosis not present

## 2020-10-21 DIAGNOSIS — R6520 Severe sepsis without septic shock: Secondary | ICD-10-CM | POA: Diagnosis present

## 2020-10-21 DIAGNOSIS — E1122 Type 2 diabetes mellitus with diabetic chronic kidney disease: Secondary | ICD-10-CM | POA: Diagnosis present

## 2020-10-21 DIAGNOSIS — D75839 Thrombocytosis, unspecified: Secondary | ICD-10-CM | POA: Diagnosis not present

## 2020-10-21 DIAGNOSIS — Z79899 Other long term (current) drug therapy: Secondary | ICD-10-CM

## 2020-10-21 DIAGNOSIS — G319 Degenerative disease of nervous system, unspecified: Secondary | ICD-10-CM | POA: Diagnosis not present

## 2020-10-21 DIAGNOSIS — Z833 Family history of diabetes mellitus: Secondary | ICD-10-CM

## 2020-10-21 DIAGNOSIS — K219 Gastro-esophageal reflux disease without esophagitis: Secondary | ICD-10-CM | POA: Diagnosis present

## 2020-10-21 DIAGNOSIS — L89899 Pressure ulcer of other site, unspecified stage: Secondary | ICD-10-CM | POA: Diagnosis present

## 2020-10-21 LAB — URINALYSIS, ROUTINE W REFLEX MICROSCOPIC
Bilirubin Urine: NEGATIVE
Glucose, UA: 50 mg/dL — AB
Ketones, ur: 5 mg/dL — AB
Nitrite: NEGATIVE
Protein, ur: 100 mg/dL — AB
Specific Gravity, Urine: 1.024 (ref 1.005–1.030)
WBC, UA: >50 WBC/hpf — ABNORMAL HIGH (ref 0–5)
pH: 5 (ref 5.0–8.0)

## 2020-10-21 LAB — BASIC METABOLIC PANEL
Anion gap: 17 — ABNORMAL HIGH (ref 5–15)
Anion gap: 13 (ref 5–15)
Anion gap: 15 (ref 5–15)
Anion gap: 13 (ref 5–15)
BUN: 74 mg/dL — ABNORMAL HIGH (ref 8–23)
BUN: 73 mg/dL — ABNORMAL HIGH (ref 8–23)
BUN: 75 mg/dL — ABNORMAL HIGH (ref 8–23)
BUN: 74 mg/dL — ABNORMAL HIGH (ref 8–23)
CO2: 15 mmol/L — ABNORMAL LOW (ref 22–32)
CO2: 17 mmol/L — ABNORMAL LOW (ref 22–32)
CO2: 15 mmol/L — ABNORMAL LOW (ref 22–32)
CO2: 18 mmol/L — ABNORMAL LOW (ref 22–32)
Calcium: 8.2 mg/dL — ABNORMAL LOW (ref 8.9–10.3)
Calcium: 8 mg/dL — ABNORMAL LOW (ref 8.9–10.3)
Calcium: 8.2 mg/dL — ABNORMAL LOW (ref 8.9–10.3)
Calcium: 8 mg/dL — ABNORMAL LOW (ref 8.9–10.3)
Chloride: 119 mmol/L — ABNORMAL HIGH (ref 98–111)
Chloride: 116 mmol/L — ABNORMAL HIGH (ref 98–111)
Chloride: 115 mmol/L — ABNORMAL HIGH (ref 98–111)
Chloride: 115 mmol/L — ABNORMAL HIGH (ref 98–111)
Creatinine, Ser: 3.83 mg/dL — ABNORMAL HIGH (ref 0.61–1.24)
Creatinine, Ser: 3.65 mg/dL — ABNORMAL HIGH (ref 0.61–1.24)
Creatinine, Ser: 3.65 mg/dL — ABNORMAL HIGH (ref 0.61–1.24)
Creatinine, Ser: 3.28 mg/dL — ABNORMAL HIGH (ref 0.61–1.24)
GFR, Estimated: 15 mL/min — ABNORMAL LOW (ref >60)
GFR, Estimated: 16 mL/min — ABNORMAL LOW (ref >60)
GFR, Estimated: 16 mL/min — ABNORMAL LOW (ref >60)
GFR, Estimated: 19 mL/min — ABNORMAL LOW (ref >60)
Glucose, Bld: 133 mg/dL — ABNORMAL HIGH (ref 70–99)
Glucose, Bld: 241 mg/dL — ABNORMAL HIGH (ref 70–99)
Glucose, Bld: 280 mg/dL — ABNORMAL HIGH (ref 70–99)
Glucose, Bld: 305 mg/dL — ABNORMAL HIGH (ref 70–99)
Potassium: 6.3 mmol/L (ref 3.5–5.1)
Potassium: 5.3 mmol/L — ABNORMAL HIGH (ref 3.5–5.1)
Potassium: 6 mmol/L — ABNORMAL HIGH (ref 3.5–5.1)
Potassium: 6 mmol/L — ABNORMAL HIGH (ref 3.5–5.1)
Sodium: 151 mmol/L — ABNORMAL HIGH (ref 135–145)
Sodium: 146 mmol/L — ABNORMAL HIGH (ref 135–145)
Sodium: 145 mmol/L (ref 135–145)
Sodium: 146 mmol/L — ABNORMAL HIGH (ref 135–145)

## 2020-10-21 LAB — CBC WITH DIFFERENTIAL/PLATELET
Abs Immature Granulocytes: 0.11 10*3/uL — ABNORMAL HIGH (ref 0.00–0.07)
Basophils Absolute: 0 10*3/uL (ref 0.0–0.1)
Basophils Relative: 0 %
Eosinophils Absolute: 0.1 10*3/uL (ref 0.0–0.5)
Eosinophils Relative: 1 %
HCT: 37.1 % — ABNORMAL LOW (ref 39.0–52.0)
Hemoglobin: 12.4 g/dL — ABNORMAL LOW (ref 13.0–17.0)
Immature Granulocytes: 1 %
Lymphocytes Relative: 7 %
Lymphs Abs: 0.9 10*3/uL (ref 0.7–4.0)
MCH: 26.1 pg (ref 26.0–34.0)
MCHC: 33.4 g/dL (ref 30.0–36.0)
MCV: 78.1 fL — ABNORMAL LOW (ref 80.0–100.0)
Monocytes Absolute: 0.4 10*3/uL (ref 0.1–1.0)
Monocytes Relative: 3 %
Neutro Abs: 11.7 10*3/uL — ABNORMAL HIGH (ref 1.7–7.7)
Neutrophils Relative %: 88 %
Platelets: 268 10*3/uL (ref 150–400)
RBC: 4.75 MIL/uL (ref 4.22–5.81)
RDW: 18.7 % — ABNORMAL HIGH (ref 11.5–15.5)
WBC: 13.2 10*3/uL — ABNORMAL HIGH (ref 4.0–10.5)
nRBC: 0.2 % (ref 0.0–0.2)

## 2020-10-21 LAB — BLOOD CULTURE ID PANEL (REFLEXED) - BCID2

## 2020-10-21 LAB — COMPREHENSIVE METABOLIC PANEL
ALT: 58 U/L — ABNORMAL HIGH (ref 0–44)
AST: 123 U/L — ABNORMAL HIGH (ref 15–41)
Albumin: 1.5 g/dL — ABNORMAL LOW (ref 3.5–5.0)
Alkaline Phosphatase: 203 U/L — ABNORMAL HIGH (ref 38–126)
Anion gap: 13 (ref 5–15)
BUN: 72 mg/dL — ABNORMAL HIGH (ref 8–23)
CO2: 20 mmol/L — ABNORMAL LOW (ref 22–32)
Calcium: 8.3 mg/dL — ABNORMAL LOW (ref 8.9–10.3)
Chloride: 117 mmol/L — ABNORMAL HIGH (ref 98–111)
Creatinine, Ser: 3.98 mg/dL — ABNORMAL HIGH (ref 0.61–1.24)
GFR, Estimated: 15 mL/min — ABNORMAL LOW (ref >60)
Glucose, Bld: 131 mg/dL — ABNORMAL HIGH (ref 70–99)
Potassium: 6 mmol/L — ABNORMAL HIGH (ref 3.5–5.1)
Sodium: 150 mmol/L — ABNORMAL HIGH (ref 135–145)
Total Bilirubin: 1.6 mg/dL — ABNORMAL HIGH (ref 0.3–1.2)
Total Protein: 8.1 g/dL (ref 6.5–8.1)

## 2020-10-21 LAB — I-STAT VENOUS BLOOD GAS, ED
Acid-base deficit: 7 mmol/L — ABNORMAL HIGH (ref 0.0–2.0)
Bicarbonate: 15.5 mmol/L — ABNORMAL LOW (ref 20.0–28.0)
Calcium, Ion: 0.97 mmol/L — ABNORMAL LOW (ref 1.15–1.40)
HCT: 28 % — ABNORMAL LOW (ref 39.0–52.0)
Hemoglobin: 9.5 g/dL — ABNORMAL LOW (ref 13.0–17.0)
O2 Saturation: 79 %
Patient temperature: 37
Potassium: 5.9 mmol/L — ABNORMAL HIGH (ref 3.5–5.1)
Sodium: 151 mmol/L — ABNORMAL HIGH (ref 135–145)
TCO2: 16 mmol/L — ABNORMAL LOW (ref 22–32)
pCO2, Ven: 21.3 mmHg — ABNORMAL LOW (ref 44.0–60.0)
pH, Ven: 7.471 — ABNORMAL HIGH (ref 7.250–7.430)
pO2, Ven: 39 mmHg (ref 32.0–45.0)

## 2020-10-21 LAB — GLUCOSE, CAPILLARY
Glucose-Capillary: 183 mg/dL — ABNORMAL HIGH (ref 70–99)
Glucose-Capillary: 203 mg/dL — ABNORMAL HIGH (ref 70–99)
Glucose-Capillary: 228 mg/dL — ABNORMAL HIGH (ref 70–99)
Glucose-Capillary: 252 mg/dL — ABNORMAL HIGH (ref 70–99)

## 2020-10-21 LAB — RESP PANEL BY RT-PCR (FLU A&B, COVID) ARPGX2
Influenza A by PCR: NEGATIVE
Influenza B by PCR: NEGATIVE
SARS Coronavirus 2 by RT PCR: NEGATIVE

## 2020-10-21 LAB — LACTIC ACID, PLASMA
Lactic Acid, Venous: 2.8 mmol/L (ref 0.5–1.9)
Lactic Acid, Venous: 2.6 mmol/L (ref 0.5–1.9)
Lactic Acid, Venous: 3.8 mmol/L (ref 0.5–1.9)
Lactic Acid, Venous: 2.6 mmol/L (ref 0.5–1.9)
Lactic Acid, Venous: 3.1 mmol/L (ref 0.5–1.9)
Lactic Acid, Venous: 4.4 mmol/L (ref 0.5–1.9)
Lactic Acid, Venous: 2.6 mmol/L (ref 0.5–1.9)

## 2020-10-21 LAB — HEMOGLOBIN A1C
Hgb A1c MFr Bld: 8.1 % — ABNORMAL HIGH (ref 4.8–5.6)
Mean Plasma Glucose: 185.77 mg/dL

## 2020-10-21 LAB — CBG MONITORING, ED
Glucose-Capillary: 165 mg/dL — ABNORMAL HIGH (ref 70–99)
Glucose-Capillary: 141 mg/dL — ABNORMAL HIGH (ref 70–99)

## 2020-10-21 LAB — PROCALCITONIN: Procalcitonin: 11.07 ng/mL

## 2020-10-21 LAB — APTT: aPTT: 42 seconds — ABNORMAL HIGH (ref 24–36)

## 2020-10-21 LAB — PROTIME-INR
INR: 1.4 — ABNORMAL HIGH (ref 0.8–1.2)
Prothrombin Time: 17 seconds — ABNORMAL HIGH (ref 11.4–15.2)

## 2020-10-21 LAB — AMMONIA: Ammonia: <9 umol/L — ABNORMAL LOW (ref 9–35)

## 2020-10-21 LAB — SODIUM: Sodium: 149 mmol/L — ABNORMAL HIGH (ref 135–145)

## 2020-10-21 LAB — TSH: TSH: 1.638 u[IU]/mL (ref 0.350–4.500)

## 2020-10-21 LAB — MRSA PCR SCREENING: MRSA by PCR: NEGATIVE

## 2020-10-21 MED ORDER — ACETAMINOPHEN 650 MG RE SUPP
650.0000 mg | Freq: Three times a day (TID) | RECTAL | Status: DC | PRN
  Filled 2020-10-21: qty 1

## 2020-10-21 MED ORDER — BRIMONIDINE TARTRATE 0.2 % OP SOLN
1.0000 [drp] | Freq: Three times a day (TID) | OPHTHALMIC | Status: DC
  Administered 2020-10-21 – 2020-11-19 (×87): 1 [drp] via OPHTHALMIC
  Filled 2020-10-21 (×2): qty 5

## 2020-10-21 MED ORDER — METOPROLOL TARTRATE 5 MG/5ML IV SOLN
2.5000 mg | INTRAVENOUS | Status: AC | PRN
  Administered 2020-10-21 (×2): 2.5 mg via INTRAVENOUS
  Filled 2020-10-21: qty 5

## 2020-10-21 MED ORDER — NOREPINEPHRINE 4 MG/250ML-% IV SOLN: 0.0000 ug/min | INTRAVENOUS | Status: DC

## 2020-10-21 MED ORDER — LACTATED RINGERS IV BOLUS
500.0000 mL | Freq: Once | INTRAVENOUS | Status: AC
  Administered 2020-10-21: 500 mL via INTRAVENOUS

## 2020-10-21 MED ORDER — FENTANYL CITRATE (PF) 100 MCG/2ML IJ SOLN
INTRAMUSCULAR | Status: AC
  Filled 2020-10-21: qty 2

## 2020-10-21 MED ORDER — POLYETHYLENE GLYCOL 3350 17 G PO PACK: 17.0000 g | PACK | Freq: Every day | ORAL | Status: DC | PRN

## 2020-10-21 MED ORDER — SODIUM ZIRCONIUM CYCLOSILICATE 5 G PO PACK
10.0000 g | PACK | Freq: Once | ORAL | Status: AC
  Administered 2020-10-21: 10 g
  Filled 2020-10-21: qty 2

## 2020-10-21 MED ORDER — PROSOURCE PLUS PO LIQD
30.0000 mL | Freq: Three times a day (TID) | ORAL | Status: DC
  Administered 2020-10-21: 30 mL via ORAL
  Filled 2020-10-21 (×6): qty 30

## 2020-10-21 MED ORDER — METOPROLOL TARTRATE 5 MG/5ML IV SOLN: 2.5000 mg | INTRAVENOUS | Status: DC

## 2020-10-21 MED ORDER — SODIUM CHLORIDE 0.9 % IV SOLN
2.0000 g | Freq: Once | INTRAVENOUS | Status: AC
  Administered 2020-10-21: 2 g via INTRAVENOUS
  Filled 2020-10-21: qty 2

## 2020-10-21 MED ORDER — SODIUM CHLORIDE 0.9 % IV SOLN
1.5000 g | Freq: Once | INTRAVENOUS | Status: DC
  Filled 2020-10-21: qty 4

## 2020-10-21 MED ORDER — VANCOMYCIN HCL IN DEXTROSE 1-5 GM/200ML-% IV SOLN
1000.0000 mg | Freq: Once | INTRAVENOUS | Status: DC
  Filled 2020-10-21: qty 200

## 2020-10-21 MED ORDER — DEXTROSE 5 % IV SOLN: INTRAVENOUS | Status: DC

## 2020-10-21 MED ORDER — CEFAZOLIN SODIUM-DEXTROSE 2-4 GM/100ML-% IV SOLN
2.0000 g | Freq: Once | INTRAVENOUS | Status: AC
  Administered 2020-10-21: 2 g via INTRAVENOUS
  Filled 2020-10-21: qty 100

## 2020-10-21 MED ORDER — SODIUM ZIRCONIUM CYCLOSILICATE 5 G PO PACK: 10.0000 g | PACK | Freq: Once | ORAL | Status: DC

## 2020-10-21 MED ORDER — LACTATED RINGERS IV SOLN: INTRAVENOUS | Status: DC

## 2020-10-21 MED ORDER — HYDROMORPHONE HCL 1 MG/ML IJ SOLN
0.5000 mg | INTRAMUSCULAR | Status: DC | PRN
  Administered 2020-10-25 – 2020-11-16 (×14): 0.5 mg via INTRAVENOUS
  Filled 2020-10-21 (×2): qty 1
  Filled 2020-10-21: qty 0.5
  Filled 2020-10-21 (×3): qty 1
  Filled 2020-10-21: qty 0.5
  Filled 2020-10-21 (×3): qty 1
  Filled 2020-10-21: qty 0.5
  Filled 2020-10-21 (×3): qty 1

## 2020-10-21 MED ORDER — HEPARIN SODIUM (PORCINE) 5000 UNIT/ML IJ SOLN
5000.0000 [IU] | Freq: Three times a day (TID) | INTRAMUSCULAR | Status: DC
  Administered 2020-10-21: 5000 [IU] via SUBCUTANEOUS
  Filled 2020-10-21: qty 1

## 2020-10-21 MED ORDER — LIDOCAINE HCL 1 % IJ SOLN
INTRAMUSCULAR | Status: AC
  Filled 2020-10-21: qty 20

## 2020-10-21 MED ORDER — INSULIN ASPART 100 UNIT/ML IV SOLN: 10.0000 [IU] | Freq: Once | INTRAVENOUS | Status: DC

## 2020-10-21 MED ORDER — SODIUM CHLORIDE 0.9 % IV SOLN
2.0000 g | INTRAVENOUS | Status: DC
  Administered 2020-10-21: 2 g via INTRAVENOUS
  Filled 2020-10-21: qty 20

## 2020-10-21 MED ORDER — DEXTROSE 50 % IV SOLN
25.0000 g | Freq: Once | INTRAVENOUS | Status: AC
  Administered 2020-10-21: 25 g via INTRAVENOUS
  Filled 2020-10-21: qty 50

## 2020-10-21 MED ORDER — SODIUM CHLORIDE 0.9 % IV BOLUS: 500.0000 mL | Freq: Once | INTRAVENOUS | Status: DC

## 2020-10-21 MED ORDER — INSULIN ASPART 100 UNIT/ML IV SOLN
10.0000 [IU] | Freq: Once | INTRAVENOUS | Status: AC
  Administered 2020-10-21: 10 [IU] via INTRAVENOUS

## 2020-10-21 MED ORDER — ATORVASTATIN CALCIUM 40 MG PO TABS: 40.0000 mg | ORAL_TABLET | Freq: Every day | ORAL | Status: DC

## 2020-10-21 MED ORDER — SODIUM CHLORIDE 0.9 % IV SOLN
500.0000 mg | Freq: Two times a day (BID) | INTRAVENOUS | Status: DC
  Administered 2020-10-21: 500 mg via INTRAVENOUS
  Filled 2020-10-21 (×2): qty 0.5

## 2020-10-21 MED ORDER — LACTATED RINGERS IV BOLUS (SEPSIS)
250.0000 mL | Freq: Once | INTRAVENOUS | Status: AC
  Administered 2020-10-21: 250 mL via INTRAVENOUS

## 2020-10-21 MED ORDER — FERROUS SULFATE 325 (65 FE) MG PO TABS: 325.0000 mg | ORAL_TABLET | Freq: Every day | ORAL | Status: DC

## 2020-10-21 MED ORDER — LACTATED RINGERS IV BOLUS (SEPSIS)
1000.0000 mL | Freq: Once | INTRAVENOUS | Status: AC
  Administered 2020-10-21: 1000 mL via INTRAVENOUS

## 2020-10-21 MED ORDER — SODIUM ZIRCONIUM CYCLOSILICATE 5 G PO PACK
10.0000 g | PACK | Freq: Once | ORAL | Status: DC
  Filled 2020-10-21: qty 2

## 2020-10-21 MED ORDER — SODIUM CHLORIDE 0.9% FLUSH
5.0000 mL | Freq: Three times a day (TID) | INTRAVENOUS | Status: DC
  Administered 2020-10-21 – 2020-11-19 (×79): 5 mL

## 2020-10-21 MED ORDER — PANTOPRAZOLE SODIUM 40 MG IV SOLR
40.0000 mg | Freq: Every day | INTRAVENOUS | Status: DC
  Administered 2020-10-21 – 2020-11-04 (×15): 40 mg via INTRAVENOUS
  Filled 2020-10-21 (×14): qty 40

## 2020-10-21 MED ORDER — METOPROLOL TARTRATE 5 MG/5ML IV SOLN: 2.5000 mg | INTRAVENOUS | Status: DC | PRN

## 2020-10-21 MED ORDER — LATANOPROST 0.005 % OP SOLN
1.0000 [drp] | Freq: Every day | OPHTHALMIC | Status: DC
  Administered 2020-10-21 – 2020-11-18 (×29): 1 [drp] via OPHTHALMIC
  Filled 2020-10-21 (×3): qty 2.5

## 2020-10-21 MED ORDER — ASPIRIN 325 MG PO TABS: 325.0000 mg | ORAL_TABLET | Freq: Every day | ORAL | Status: DC

## 2020-10-21 MED ORDER — SODIUM ZIRCONIUM CYCLOSILICATE 10 G PO PACK: 10.0000 g | PACK | Freq: Once | ORAL | Status: DC

## 2020-10-21 MED ORDER — INSULIN ASPART 100 UNIT/ML ~~LOC~~ SOLN
0.0000 [IU] | SUBCUTANEOUS | Status: DC
  Administered 2020-10-21: 2 [IU] via SUBCUTANEOUS
  Administered 2020-10-21: 1 [IU] via SUBCUTANEOUS
  Administered 2020-10-21: 2 [IU] via SUBCUTANEOUS
  Administered 2020-10-21: 3 [IU] via SUBCUTANEOUS
  Administered 2020-10-22: 4 [IU] via SUBCUTANEOUS
  Administered 2020-10-22 (×2): 2 [IU] via SUBCUTANEOUS
  Administered 2020-10-22: 3 [IU] via SUBCUTANEOUS
  Administered 2020-10-22: 4 [IU] via SUBCUTANEOUS
  Administered 2020-10-22: 1 [IU] via SUBCUTANEOUS
  Administered 2020-10-23 (×3): 3 [IU] via SUBCUTANEOUS

## 2020-10-21 MED ORDER — SODIUM CHLORIDE 0.45 % IV BOLUS
1000.0000 mL | Freq: Once | INTRAVENOUS | Status: AC
  Administered 2020-10-21: 1000 mL via INTRAVENOUS

## 2020-10-21 MED ORDER — FUROSEMIDE 10 MG/ML IJ SOLN
40.0000 mg | Freq: Once | INTRAMUSCULAR | Status: AC
  Administered 2020-10-21: 40 mg via INTRAVENOUS
  Filled 2020-10-21: qty 4

## 2020-10-21 MED ORDER — ACETAMINOPHEN 325 MG PO TABS: 650.0000 mg | ORAL_TABLET | Freq: Four times a day (QID) | ORAL | Status: DC | PRN

## 2020-10-21 MED ORDER — DEXTROSE 50 % IV SOLN
50.0000 mL | Freq: Once | INTRAVENOUS | Status: DC
  Filled 2020-10-21: qty 50

## 2020-10-21 MED ORDER — LINEZOLID 600 MG/300ML IV SOLN
600.0000 mg | Freq: Once | INTRAVENOUS | Status: AC
  Administered 2020-10-21: 600 mg via INTRAVENOUS
  Filled 2020-10-21: qty 300

## 2020-10-21 MED ORDER — CALCIUM GLUCONATE-NACL 1-0.675 GM/50ML-% IV SOLN
1.0000 g | Freq: Once | INTRAVENOUS | Status: AC
  Administered 2020-10-21: 1000 mg via INTRAVENOUS
  Filled 2020-10-21: qty 50

## 2020-10-21 MED ORDER — DOCUSATE SODIUM 100 MG PO CAPS: 100.0000 mg | ORAL_CAPSULE | Freq: Two times a day (BID) | ORAL | Status: DC | PRN

## 2020-10-21 MED ORDER — DEXTROSE 50 % IV SOLN
50.0000 mL | Freq: Once | INTRAVENOUS | Status: AC
  Administered 2020-10-21: 50 mL via INTRAVENOUS
  Filled 2020-10-21: qty 50

## 2020-10-21 MED ORDER — CEFAZOLIN SODIUM-DEXTROSE 1-4 GM/50ML-% IV SOLN
1.0000 g | Freq: Two times a day (BID) | INTRAVENOUS | Status: DC
  Administered 2020-10-22 – 2020-10-23 (×3): 1 g via INTRAVENOUS
  Filled 2020-10-21 (×4): qty 50

## 2020-10-21 MED ORDER — METOPROLOL TARTRATE 5 MG/5ML IV SOLN
5.0000 mg | Freq: Four times a day (QID) | INTRAVENOUS | Status: DC
  Filled 2020-10-21 (×2): qty 5

## 2020-10-21 MED ORDER — ACETAMINOPHEN 650 MG RE SUPP
650.0000 mg | Freq: Once | RECTAL | Status: AC
  Administered 2020-10-21: 650 mg via RECTAL
  Filled 2020-10-21: qty 1

## 2020-10-21 MED ORDER — CHLORHEXIDINE GLUCONATE CLOTH 2 % EX PADS
6.0000 | MEDICATED_PAD | Freq: Every day | CUTANEOUS | Status: DC
  Administered 2020-10-21 – 2020-11-27 (×38): 6 via TOPICAL

## 2020-10-21 MED ORDER — INSULIN ASPART 100 UNIT/ML ~~LOC~~ SOLN
5.0000 [IU] | Freq: Once | SUBCUTANEOUS | Status: AC
  Administered 2020-10-21: 5 [IU] via INTRAVENOUS

## 2020-10-21 MED ORDER — SODIUM CHLORIDE 0.9 % IV SOLN: 2.0000 g | INTRAVENOUS | Status: DC

## 2020-10-21 MED ORDER — LACTATED RINGERS IV BOLUS
1000.0000 mL | Freq: Once | INTRAVENOUS | Status: AC
  Administered 2020-10-21: 1000 mL via INTRAVENOUS

## 2020-10-21 MED ORDER — LINEZOLID 600 MG/300ML IV SOLN
600.0000 mg | Freq: Two times a day (BID) | INTRAVENOUS | Status: DC
  Filled 2020-10-21 (×2): qty 300

## 2020-10-21 NOTE — Sedation Documentation (Signed)
Patient presented very confused, agitated, moaning and groaning. He wa

## 2020-10-21 NOTE — Consult Note (Signed)
Marcene Corning, MD   Elodia Florence, PA-C                                  [facility]                [address] [place], [place]  [address]   ORTHOPAEDIC CONSULTATION  Marcus Downs.            MRN:  [id number] DOB/SEX:  1941/10/15/male     CHIEF COMPLAINT:  Sepsis  HISTORY: Marcus Downsis a 79 y.o. male with history of hip fracture treated by one of my partners almost 3 months back.  Now septic and being treated for UTI and hip abscess.  Underwent perc drain placement by radiology today.  Cultures sent and on broad spectrum antibiotic coverage.  Stable pressure off pressers in ICU.   PAST MEDICAL HISTORY: Patient Active Problem List   Diagnosis Date Noted  . Severe sepsis (HCC) 11/07/2020  . Dysphagia as late effect of stroke 08/22/2020  . Urinary retention 08/22/2020  . Tachycardia   . Status post hip hemiarthroplasty 08/07/2020  . S/P hip hemiarthroplasty 08/07/2020  . Pressure injury of skin 08/07/2020  . Closed left hip fracture (HCC) 08/06/2020  . Elevated alkaline phosphatase level 11/01/2019  . Type 2 diabetes mellitus with vascular disease (HCC) 08/16/2019  . Diabetes mellitus with cataract (HCC) 08/05/2019  . Weight loss 04/30/2019  . Iron deficiency anemia 04/29/2018  . Pedal edema 07/11/2017  . Primary open angle glaucoma   . Advanced care planning/counseling discussion 02/20/2015  . Benign prostatic hyperplasia 02/20/2015  . Falls 11/08/2013  . Medicare annual wellness visit, subsequent 05/25/2012  . 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 Pain due to onychomycosis of toenails of both feet 09/10/2010  . Renal insufficiency 04/13/2009  . GERD 03/30/2009  . Controlled diabetes mellitus type 2 with complications (HCC) 05/04/2007  . Hyperlipidemia associated with type 2 diabetes mellitus (HCC) 05/04/2007  . Essential hypertension 05/04/2007  . SYMPTOM, INCONTINENCE, MIXED, URGE/STRESS 05/04/2007  . Ex-smoker  05/04/2007   Past Medical History:  Diagnosis Date  . CRI (chronic renal insufficiency)   . 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 Diabetes mellitus type II 1992  . HLD (hyperlipidemia) 10/1998  . HTN (hypertension) 10/1998  . Primary open angle glaucoma    [name]  . RBBB   . Stroke [facility])    Left sided weakness   Past Surgical History:  Procedure Laterality Date  . CATARACT EXTRACTION W/PHACO  09/09/2012   Procedure: CATARACT EXTRACTION PHACO AND INTRAOCULAR LENS PLACEMENT (IOC);  Surgeon: [name], MD;  Location: [facility] OR;  Service: Ophthalmology;  Laterality: Right;  . CATARACT EXTRACTION W/PHACO Left 03/31/2013   Procedure: CATARACT EXTRACTION PHACO AND INTRAOCULAR LENS PLACEMENT (IOC);  Surgeon: [name], MD;  Location: [facility] OR;  Service: Ophthalmology;  Laterality: Left;  . EYE SURGERY    . HIP ARTHROPLASTY Left 08/07/2020   Procedure: ARTHROPLASTY BIPOLAR HIP (HEMIARTHROPLASTY);  Surgeon: [name], MD;  Location: [facility] ORS;  Service: Orthopedics;  Laterality: Left;  . lipoma removal  1970's   Right shoulder  . US ECHOCARDIOGRAPHY  03/2013   mod LVH, EF 60%, normal wall motion     MEDICATIONS:   Current Facility-Administered Medications:  .  (feeding supplement) PROSource Plus liquid 30 mL, 30 mL, Oral, TID WC & HS, Nedra Hai,  Artist Pais, MD .  acetaminophen (TYLENOL) suppository 650 mg, 650 mg, Rectal, Q8H PRN, Theotis Barrio, MD .  acetaminophen (TYLENOL) tablet 650 mg, 650 mg, Oral, Q6H PRN, Theotis Barrio, MD .  brimonidine (ALPHAGAN) 0.2 % ophthalmic solution 1 drop, 1 drop, Both Eyes, TID, Theotis Barrio, MD, 1 drop at 11-04-2020 1125 .  cefTRIAXone (ROCEPHIN) 2 g in sodium chloride 0.9 % 100 mL IVPB, 2 g, Intravenous, Q24H, Theotis Barrio, MD, Last Rate: 200 mL/hr at 2020-11-04 1148, 2 g at 11/04/20 1148 .  Chlorhexidine Gluconate Cloth 2 % PADS 6 each, 6 each, Topical, Daily, Icard, Bradley L, DO, 6 each at 2020/11/04 1500 .  docusate sodium (COLACE)  capsule 100 mg, 100 mg, Oral, BID PRN, Theotis Barrio, MD .  fentaNYL (SUBLIMAZE) 100 MCG/2ML injection, , , ,  .  fentaNYL (SUBLIMAZE) 100 MCG/2ML injection, , , ,  .  HYDROmorphone (DILAUDID) injection 0.5 mg, 0.5 mg, Intravenous, Q3H PRN, Theotis Barrio, MD .  insulin aspart (novoLOG) injection 0-6 Units, 0-6 Units, Subcutaneous, Q4H, Theotis Barrio, MD, 2 Units at Nov 04, 2020 1506 .  lactated ringers bolus 500 mL, 500 mL, Intravenous, Once, Icard, Bradley L, DO .  lactated ringers infusion, , Intravenous, Continuous, Theotis Barrio, MD, Last Rate: 100 mL/hr at Nov 04, 2020 1420, New Bag at 11-04-2020 1420 .  latanoprost (XALATAN) 0.005 % ophthalmic solution 1 drop, 1 drop, Both Eyes, QHS, Theotis Barrio, MD .  lidocaine (XYLOCAINE) 1 % (with pres) injection, , , ,  .  linezolid (ZYVOX) IVPB 600 mg, 600 mg, Intravenous, Q12H, Theotis Barrio, MD .  metoprolol tartrate (LOPRESSOR) injection 2.5 mg, 2.5 mg, Intravenous, Q4H PRN, Theotis Barrio, MD .  metoprolol tartrate (LOPRESSOR) injection 5 mg, 5 mg, Intravenous, Q6H, Theotis Barrio, MD .  norepinephrine (LEVOPHED) 4mg  in premix infusion, 0-40 mcg/min, Intravenous, Titrated, , MD .  pantoprazole (PROTONIX) injection 40 mg, 40 mg, Intravenous, QHS, Theotis Barrio, MD .  polyethylene glycol (MIRALAX / GLYCOLAX) packet 17 g, 17 g, Oral, Daily PRN, Theotis Barrio, MD .  sodium zirconium cyclosilicate (LOKELMA) packet 10 g, 10 g, Oral, Once, Katsadouros, Vasilios, MD  ALLERGIES:   Allergies  Allergen Reactions  . Tape Rash    Burns    REVIEW OF SYSTEMS: REVIEWED IN DETAIL IN CHART  FAMILY HISTORY:   Family History  Problem Relation Age of Onset  . Heart attack Father 41  . Hypertension Sister        Multiple medical problems  . Diabetes Sister   . Stroke Neg Hx   . Cancer Neg Hx     SOCIAL HISTORY:   Social History   Tobacco Use  . Smoking status: Former Smoker    Packs/day: 1.00    Years: 30.00    Pack years: 30.00     Types: Cigarettes    Quit date: 09/07/1996    Years since quitting: 24.1  . Smokeless tobacco: Never Used  . Tobacco comment: quit after CVA  Substance Use Topics  . Alcohol use: No     EXAMINATION: Vital signs in last 24 hours: Temp:  [99.3 F (37.4 C)-102.1 F (38.9 C)] 99.3 F (37.4 C) (12/04 1557) Pulse Rate:  [26-166] 166 (12/04 1557) Resp:  [19-45] 30 (12/04 1557) BP: (88-124)/(31-95) 108/57 (12/04 1557) SpO2:  [0 %-100 %] 100 % (12/04 1557) Weight:  [70 kg] 70 kg (12/04 0139)  BP (!) 108/57 (BP Location: Right  Arm)   Pulse (!) 166   Temp 99.3 F (37.4 C) (Axillary)   Resp (!) 30   Ht 6' (1.829 m)   Wt 70 kg   SpO2 100%   BMI 20.93 kg/m                                                              Musculoskeletal Exam:   Left hip has drain.  Leg lengths equal.  Moderate thigh swelling. Baseline dementia   DIAGNOSTIC STUDIES: Recent laboratory studies: Recent Labs    Nov 05, 2020 0100 11/05/2020 0404  WBC 13.2*  --   HGB 12.4* 9.5*  HCT 37.1* 28.0*  PLT 268  --    Recent Labs    2020-11-05 0100 2020/11/05 0400 11/05/20 0404 05-Nov-2020 0722 11-05-2020 1010 11-05-20 1437  NA 150* 151* 151* 149* 146* 145  K 6.0* 6.3* 5.9*  --  5.3* 6.0*  CL 117* 119*  --   --  116* 115*  CO2 20* 15*  --   --  17* 15*  BUN 72* 74*  --   --  73* 75*  CREATININE 3.98* 3.83*  --   --  3.65* 3.65*  GLUCOSE 131* 133*  --   --  241* 280*  CALCIUM 8.3* 8.2*  --   --  8.0* 8.2*   Lab Results  Component Value Date   INR 1.4 (H) 11-05-2020     Recent Radiographic Studies :  CT ABDOMEN PELVIS WO CONTRAST  Result Date: Nov 05, 2020 CLINICAL DATA:  Sepsis EXAM: CT ABDOMEN AND PELVIS WITHOUT CONTRAST TECHNIQUE: Multidetector CT imaging of the abdomen and pelvis was performed following the standard protocol without IV contrast. COMPARISON:  None. FINDINGS: Lower chest: Mild bibasilar atelectasis. Hepatobiliary: No focal liver abnormality is seen. Gallbladder is  unremarkable. No bile duct dilatation. Pancreas: Partially infiltrated with fat but otherwise unremarkable. No peripancreatic fluid. Spleen: Unremarkable, although evaluation is limited by motion artifact. Adrenals/Urinary Tract: Adrenal glands are unremarkable. Kidneys are unremarkable without stone or hydronephrosis. No perinephric fluid. Bladder is decompressed by Foley catheter. Stomach/Bowel: No dilated large or small bowel loops. No convincing evidence of an acute bowel wall inflammation. Stomach is unremarkable, partially decompressed. Vascular/Lymphatic: Aortic atherosclerosis. No enlarged lymph nodes are seen in the abdomen or pelvis. Reproductive: Prostate is unremarkable. Other: No free fluid or abscess collection is seen within the abdomen or pelvis. No free intraperitoneal air. Musculoskeletal: Degenerative spondylosis of the lumbar spine, moderate in degree. No acute appearing osseous abnormality. LEFT hip arthroplasty hardware in place. Prominent fluid-like collection lateral to the LEFT hip arthroplasty hardware, incompletely imaged, of uncertain age, containing small foci of air. IMPRESSION: 1. Prominent fluid-like collection lateral to the LEFT hip arthroplasty hardware, incompletely imaged, of uncertain age but small foci of air are seen within the collection which could indicate acute abscess. Alternatively, this could represent chronic seroma or hematoma. Consider further characterization with ultrasound. 2. No additional acute or significant findings within the abdomen or pelvis. No bowel obstruction or evidence of an acute bowel wall inflammation. No free fluid or abscess collection is seen within the abdomen or pelvis. No evidence of acute solid organ abnormality. No renal or ureteral stone. Aortic Atherosclerosis (ICD10-I70.0). Electronically Signed   By: Bary Richard M.D.   On: 11-05-2020 04:51  DG Chest 1 View  Result Date: 2020/11/05 CLINICAL DATA:  Suspected sepsis. EXAM: CHEST  1  VIEW COMPARISON:  08/09/2020 FINDINGS: The heart size and mediastinal contours are within normal limits. Both lungs are clear. The visualized skeletal structures are unremarkable. IMPRESSION: No active disease. Electronically Signed   By: Katherine Mantle M.D.   On: 11-05-2020 02:12   [place] LT LOWER EXTREM LTD SOFT TISSUE NON VASCULAR  Result Date: 05-Nov-2020 CLINICAL DATA:  Possible abscess EXAM: ULTRASOUND LEFT LOWER EXTREMITY LIMITED TECHNIQUE: Ultrasound examination of the lower extremity soft tissues was performed in the area of clinical concern. COMPARISON:  None. FINDINGS: Scanning in the area of clinical concern adjacent to the left hip prosthesis demonstrates a complex fluid collection likely representing a postoperative seroma/hematoma. Possibility of underlying infection could not be totally excluded on the basis of this exam. This corresponds to soft tissue density lateral to the proximal femur on recent CT examination. IMPRESSION: Complex fluid collection as described likely representing postoperative hematoma/seroma. This extends for at least 12 cm along the lateral aspect of the left thigh. Electronically Signed   By: Alcide Clever M.D.   On: 11/05/20 08:26   CT IMAGE GUIDED DRAINAGE BY PERCUTANEOUS CATHETER  Result Date: 11/05/2020 INDICATION: 79 year old with sepsis and evidence for an abscess in the left upper thigh and hip region. History of left hip arthroplasty. EXAM: CT-GUIDED DRAIN PLACEMENT IN LEFT THIGH ABSCESS MEDICATIONS: None ANESTHESIA/SEDATION: None COMPLICATIONS: None immediate. PROCEDURE: Informed consent was obtained from the patient's son after a thorough discussion of the procedural risks, benefits and alternatives. All questions were addressed. A timeout was performed prior to the initiation of the procedure. Patient was placed on the CT scanner and images through the left upper thigh were obtained. Abscess in the lateral left upper thigh was identified with CT. The  overlying skin was prepped with chlorhexidine and sterile field was created. Maximal barrier sterile technique was utilized including caps, mask, sterile gowns, sterile gloves, sterile drape, hand hygiene and skin antiseptic. Skin was anesthetized using 1% lidocaine. Using CT guidance, an 18 gauge trocar needle was directed into the abscess and pink colored purulent fluid was aspirated. Superstiff Amplatz wire was advanced into the abscess and tract was dilated to accommodate a 12 [place] multipurpose drain. 180 mL of purulent fluid was removed. Follow up CT images were obtained. Catheter was sutured to skin and attached to a suction bulb. FINDINGS: Patient has a left hip arthroplasty. Extensive subcutaneous edema in the left upper thigh with air-fluid collection along the lateral aspect of the left hip and left upper thigh. In addition, there is low-density and enlargement of the left obturator externus muscle and the left gluteus medius muscle. There is concern for additional abscess collections in these areas. The large abscess along the left lateral thigh was decompressed following drain placement. IMPRESSION: CT-guided drainage of the abscess in the left lateral thigh and hip region. 180 mL of purulent fluid was removed. Fluid sample sent for culture. Concern for additional intramuscular abscesses around the left hip involving the left obturator externus and left gluteal musculature. Electronically Signed   By: Richarda Overlie M.D.   On: 2020/11/05 14:07    ASSESSMENT:  Left hip abscess s/p drain placement   PLAN:  This most likely involves the hip joint but may not.  Will follow clinically for awhile and check follow-up CT.  If stabilizes might manage on suppressive antibiotics.  Ideal treatment is I&D and potential removal of prosthetic components but I  am not sure he would survive the general anesthetic and blood loss associated with that intervention.    Velna Ochseter G Reannah Totten 10/27/2020, 4:37 PM

## 2020-10-21 NOTE — Progress Notes (Addendum)
PROGRESS NOTE  Patient admitted earlier this morning. See H&P.   Marcus Downs. 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  At skilled nursing facility, he was noted to be febrile, urine culture positive for MSSA.  He was started on Macrobid initially on 12/2, switched to Bactrim on 12/3.  In the department, he was febrile with T-max 102.1, tachycardic heart rate 187, tachypneic, respiratory rate 45, leukocytosis with WBC 13.2.  He was also found to have hypernatremia, AKI, hyperkalemia.  Patient is ill-appearing, moaning, alert and reactive to painful stimuli but remains nonverbal.  He is tachycardic, tachypneic.  A/P:  Severe sepsis secondary to MSSA CAUTI, possible left hip abscess -Presented with fever, tachycardia, tachypnea, leukocytosis, lactic acidosis with altered mental status -Foley catheter was last exchanged 11/28 -Urine culture from SNF placement noted to be positive for MSSA -Blood culture pending -Repeat urine culture pending -CT abdomen pelvis: No additional acute or significant findings within the abdomen or pelvis. No bowel obstruction or evidence of an acute bowel wall inflammation. No free fluid or abscess collection is seen within the abdomen or pelvis. No evidence of acute solid organ abnormality. No renal or ureteral stone. -Ultrasound left hip: complex fluid collection as described likely representing postoperative hematoma/seroma. This extends for at least 12 cm along the lateral aspect of the left thigh. On chart review, he recently underwent left hip arthroplasty on 9/20 by Dr. Luiz Blare due to left femoral neck fracture  -On empiric Merrem, linezolid -Ortho  consulted. Asked IR for hip fluid collection aspiration  -ID consulted   Addendum: -Discussed with Dr. Lowella Dandy regarding image guided aspiration, possible drainage of left hip fluid collection for source control. Suspect that patient may need further surgical intervention by orthopedic surgery -Remains critically ill, PCCM consulted and transfer to ICU   Acute metabolic encephalopathy -Unclear what his baseline is. Previous admission, it was noted that he was alert and oriented x3. Could not get a hold of family over the phone this morning   Hyperkalemia -In setting of Bactrim use and at home potassium use -Unable to give Lokelma due to patient's lethargy and unable to swallow safely -EKG reviewed independently reveals wide-complex QRS without peak T waves -Patient given IV calcium gluconate, IV insulin and D50 -IV Lasix x1 -Trend BMP  AKI -Baseline creatinine 1  -Avoid nephrotoxins -IV fluid  -Trend BMP   Hypernatremia -In setting of poor p.o. intake -IVF D5 -Trend BMP   Elevated LFT -In setting of sepsis -No abnormalities seen on CT A/P  -Trend LFT   History of CVA -Chronic left sided deficits -Resume aspirin, Lipitor when able to take p.o.  Diabetes mellitus type 2 -Hemoglobin A1c 8.1 -Sliding scale insulin  History of dysphagia -SLP eval    Status is: Inpatient  Remains inpatient appropriate because:Hemodynamically unstable, Persistent severe electrolyte disturbances, Altered mental status, Ongoing diagnostic testing needed not appropriate for outpatient work up, IV treatments appropriate due to intensity of illness or inability to take PO and Inpatient level of care appropriate due to severity of illness   Dispo:  Patient From: Skilled Nursing Facility  Planned Disposition: Skilled Nursing Facility  Expected discharge date: 10/24/20  Medically stable for discharge: No. Remains critically ill. Treatments outlined as above.  Noralee Stain, DO [facility] Nov 02, 2020, 9:26 AM  Available via Epic secure chat 7am-7pm After these hours, please refer to coverage provider listed on [email or web address]

## 2020-10-21 NOTE — Progress Notes (Signed)
PHARMACY - PHYSICIAN COMMUNICATION CRITICAL VALUE ALERT - BLOOD CULTURE IDENTIFICATION (BCID)  Marcus Downs. is an 79 y.o. male who presented to [facility] on 11/09/2020 with a chief complaint of concern for UTI  Assessment: 27 YOM with chronic foley and recent urine cultures from 12/2 that grew MSSA. Blood cultures are all growing GPC in clusters with BCID detecting MSSA.   ID is consulted so BCID and de-escalation was discussed with Dr. Elinor Parkinson and the plan is to narrow to Cefazolin for now.   Name of physician (or Provider) Contacted: Manandhar (ID)  Current antibiotics: Rocephin + Zyvox  Changes to prescribed antibiotics recommended:  Narrow to Cefazolin monotherapy - will add pharmacy consult to dose due to AKI. Will start with Cefazolin 2g IV x 1 then 1g/12h and monitor progress and renal function. Low threshold to increase to 2g/12h if needed.   Results for orders placed or performed during the hospital encounter of 11/09/20  Blood Culture ID Panel (Reflexed) (Collected: 11-09-2020  1:00 AM)  Result Value Ref Range   Enterococcus faecalis NOT DETECTED NOT DETECTED   Enterococcus Faecium NOT DETECTED NOT DETECTED   Listeria monocytogenes NOT DETECTED NOT DETECTED   Staphylococcus species DETECTED (A) NOT DETECTED   Staphylococcus aureus (BCID) DETECTED (A) NOT DETECTED   Staphylococcus epidermidis NOT DETECTED NOT DETECTED   Staphylococcus lugdunensis NOT DETECTED NOT DETECTED   Streptococcus species NOT DETECTED NOT DETECTED   Streptococcus agalactiae NOT DETECTED NOT DETECTED   Streptococcus pneumoniae NOT DETECTED NOT DETECTED   Streptococcus pyogenes NOT DETECTED NOT DETECTED   A.calcoaceticus-baumannii NOT DETECTED NOT DETECTED   Bacteroides fragilis NOT DETECTED NOT DETECTED   Enterobacterales NOT DETECTED NOT DETECTED   Enterobacter cloacae complex NOT DETECTED NOT DETECTED   Escherichia coli NOT DETECTED NOT DETECTED   Klebsiella aerogenes NOT DETECTED NOT  DETECTED   Klebsiella oxytoca NOT DETECTED NOT DETECTED   Klebsiella pneumoniae NOT DETECTED NOT DETECTED   Proteus species NOT DETECTED NOT DETECTED   Salmonella species NOT DETECTED NOT DETECTED   Serratia marcescens NOT DETECTED NOT DETECTED   Haemophilus influenzae NOT DETECTED NOT DETECTED   Neisseria meningitidis NOT DETECTED NOT DETECTED   Pseudomonas aeruginosa NOT DETECTED NOT DETECTED   Stenotrophomonas maltophilia NOT DETECTED NOT DETECTED   Candida albicans NOT DETECTED NOT DETECTED   Candida auris NOT DETECTED NOT DETECTED   Candida glabrata NOT DETECTED NOT DETECTED   Candida krusei NOT DETECTED NOT DETECTED   Candida parapsilosis NOT DETECTED NOT DETECTED   Candida tropicalis NOT DETECTED NOT DETECTED   Cryptococcus neoformans/gattii NOT DETECTED NOT DETECTED   Meth resistant mecA/C and MREJ NOT DETECTED NOT DETECTED    Thank you for allowing pharmacy to be a part of this patient's care.  Georgina Pillion, PharmD, BCPS Clinical Pharmacist Clinical phone for 11-09-2020: [phone] 11-09-20 5:44 PM   **Pharmacist phone directory can now be found on [email or web address] ([email or web address]).  Listed under [facility].

## 2020-10-21 NOTE — ED Provider Notes (Signed)
[facility] EMERGENCY DEPARTMENT Provider Note   CSN: [id number] Arrival date & time: 10-30-2020  4332   History Chief Complaint  Patient presents with   Code Sepsis    Marcus Downs. is a 79 y.o. male.  The history is provided by the EMS personnel. The history is limited by the condition of the patient (Patient non-verbal).  He has history of hypertension, diabetes, hyperlipidemia, stroke with left hemiparesis, chronic kidney disease, glaucoma and was transferred from a skilled nursing facility because of possible UTI and acute kidney injury.  Foley catheter apparently was changed 4 days ago because of concern for UTI and he was started on trimethoprim-sulfamethoxazole.  Lab work today showed BUN 69 and creatinine 3.35 and potassium 6.1.  He had been given 700 mL of fluid at the nursing home and an additional 500 mL by EMS.  Patient is nonverbal and unable to give any history.  Past Medical History:  Diagnosis Date   CRI (chronic renal insufficiency)    CVA (cerebral infarction) 1997   Right with residual LUE weakness   Diabetes mellitus type II 1992   HLD (hyperlipidemia) 10/1998   HTN (hypertension) 10/1998   Primary open angle glaucoma    [name]   RBBB    Stroke [facility])    Left sided weakness    Patient Active Problem List   Diagnosis Date Noted   Dysphagia as late effect of stroke 08/22/2020   Urinary retention 08/22/2020   Tachycardia    Status post hip hemiarthroplasty 08/07/2020   S/P hip hemiarthroplasty 08/07/2020   Pressure injury of skin 08/07/2020   Closed left hip fracture (HCC) 08/06/2020   Elevated alkaline phosphatase level 11/01/2019   Type 2 diabetes mellitus with vascular disease (HCC) 08/16/2019   Diabetes mellitus with cataract (HCC) 08/05/2019   Weight loss 04/30/2019   Iron deficiency anemia 04/29/2018   Pedal edema 07/11/2017   Primary open angle glaucoma    Advanced care planning/counseling  discussion 02/20/2015   Benign prostatic hyperplasia 02/20/2015   Falls 11/08/2013   Medicare annual wellness visit, subsequent 05/25/2012   Hemiplegia, 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   Pain due to onychomycosis of toenails of both feet 09/10/2010   Renal insufficiency 04/13/2009   GERD 03/30/2009   Controlled diabetes mellitus type 2 with complications (HCC) 05/04/2007   Hyperlipidemia associated with type 2 diabetes mellitus (HCC) 05/04/2007   Essential hypertension 05/04/2007   SYMPTOM, INCONTINENCE, MIXED, URGE/STRESS 05/04/2007   Ex-smoker 05/04/2007    Past Surgical History:  Procedure Laterality Date   CATARACT EXTRACTION W/PHACO  09/09/2012   Procedure: CATARACT EXTRACTION PHACO AND INTRAOCULAR LENS PLACEMENT (IOC);  Surgeon: [name], MD;  Location: [facility] OR;  Service: Ophthalmology;  Laterality: Right;   CATARACT EXTRACTION W/PHACO Left 03/31/2013   Procedure: CATARACT EXTRACTION PHACO AND INTRAOCULAR LENS PLACEMENT (IOC);  Surgeon: [name], MD;  Location: [facility] OR;  Service: Ophthalmology;  Laterality: Left;   EYE SURGERY     HIP ARTHROPLASTY Left 08/07/2020   Procedure: ARTHROPLASTY BIPOLAR HIP (HEMIARTHROPLASTY);  Surgeon: [name], MD;  Location: [facility] ORS;  Service: Orthopedics;  Laterality: Left;   lipoma removal  1970's   Right shoulder   US ECHOCARDIOGRAPHY  03/2013   mod LVH, EF 60%, normal wall motion       Family History  Problem Relation Age of Onset   Heart attack Father 31   Hypertension Sister        Multiple medical problems  Diabetes Sister    Stroke Neg Hx    Cancer Neg Hx     Social History   Tobacco Use   Smoking status: Former Smoker    Packs/day: 1.00    Years: 30.00    Pack years: 30.00    Types: Cigarettes    Quit date: 09/07/1996    Years since quitting: 24.1   Smokeless tobacco: Never Used   Tobacco comment: quit after CVA  Vaping Use   Vaping Use: Never used  Substance  Use Topics   Alcohol use: No   Drug use: No    Home Medications Prior to Admission medications   Medication Sig Start Date End Date Taking? Authorizing Provider  acetaminophen (TYLENOL) 325 MG tablet Take 650 mg by mouth every 6 (six) hours as needed for mild pain or fever. 08/14/20  Yes [provider]  atorvastatin (LIPITOR) 40 MG tablet Take 1 tablet (40 mg total) by mouth daily. 08/14/20  Yes Rhetta Mura, MD  ferrous sulfate 325 (65 FE) MG tablet Take 325 mg by mouth daily.  08/31/18  Yes [name], MD  Insulin Aspart Prot & Aspart (NOVOLOG MIX 70/30 FLEXPEN [place]) Inject into the skin in the morning, at noon, and at bedtime. Inject 14 Units after breakfast, Inject 16 units after dinner, Inject 18 units after lunch.   Yes [provider]  metoprolol tartrate (LOPRESSOR) 25 MG tablet Take 1 tablet (25 mg total) by mouth 2 (two) times daily. 08/14/20  Yes Rhetta Mura, MD  potassium chloride (KLOR-CON) 10 MEQ tablet TAKE 3 TABLETS BY MOUTH DAILY 09/11/20  Yes [name], MD  Amino Acids-Protein Hydrolys (FEEDING SUPPLEMENT, PRO-STAT 64,) LIQD Take 30 mLs by mouth in the morning and at bedtime. For wound healing 08/15/20   [provider]  aspirin 325 MG tablet Take 325 mg by mouth daily.    [provider]  bisacodyl (DULCOLAX) 10 MG suppository Place 10 mg rectally as needed for moderate constipation.    [provider]  brimonidine (ALPHAGAN P) 0.1 % SOLN Place 1 drop into both eyes 3 (three) times daily.    [provider]  latanoprost (XALATAN) 0.005 % ophthalmic solution Place 1 drop into both eyes at bedtime.    [provider]  magnesium hydroxide (MILK OF MAGNESIA) 400 MG/5ML suspension Take by mouth daily as needed for mild constipation.    [provider]  MULTIPLE VITAMIN PO Take 1 tablet by mouth daily. W/ Minerals to promote wound healing    [provider]  nitrofurantoin  (MACRODANTIN) 100 MG capsule Take 100 mg by mouth 2 (two) times daily. For UTI 10/19/20 10/23/20  [provider]  NON FORMULARY Medpass TID d/t weight loss. 09/13/20   [provider]  Sodium Phosphates (RA SALINE ENEMA RE) Place rectally as needed.    [provider]    Allergies    Tape  Review of Systems   Review of Systems  Unable to perform ROS: Patient nonverbal    Physical Exam Updated Vital Signs BP 107/71 (BP Location: Right Arm)    Pulse (!) 157    Temp (!) 101.1 F (38.4 C) (Rectal)    Resp 19    Ht 6' (1.829 m)    Wt 70 kg    SpO2 98%    BMI 20.93 kg/m   Physical Exam Vitals and nursing note reviewed.   79 year old male, somewhat cachectic, combative, but in no acute distress. Vital signs  are significant for rapid heart rate, elevated temperature. Oxygen saturation is 98%, which is normal. Head is normocephalic and atraumatic. PERRLA, EOMI. Oropharynx is clear.  Mucous membranes are dry. Neck is nontender and supple without adenopathy or JVD. Back is nontender and there is no CVA tenderness. Lungs are clear without rales, wheezes, or rhonchi. Chest is nontender. Heart has regular rate and rhythm without murmur. Abdomen is soft, flat, nontender without masses or hepatosplenomegaly and peristalsis is hypoactive. Extremities have no cyanosis or edema.  No deformities seen. Skin is warm and dry without rash. Neurologic: Awake but nonverbal, does not follow commands.  Dense left hemiparesis noted.   ED Results / Procedures / Treatments   Labs (all labs ordered are listed, but only abnormal results are displayed) Labs Reviewed  COMPREHENSIVE METABOLIC PANEL - Abnormal; Notable for the following components:      Result Value   Sodium 150 (*)    Potassium 6.0 (*)    Chloride 117 (*)    CO2 20 (*)    Glucose, Bld 131 (*)    BUN 72 (*)    Creatinine, Ser 3.98 (*)    Calcium 8.3 (*)    Albumin 1.5 (*)    AST 123 (*)    ALT 58 (*)     Alkaline Phosphatase 203 (*)    Total Bilirubin 1.6 (*)    GFR, Estimated 15 (*)    All other components within normal limits  LACTIC ACID, PLASMA - Abnormal; Notable for the following components:   Lactic Acid, Venous 2.8 (*)    All other components within normal limits  CBC WITH DIFFERENTIAL/PLATELET - Abnormal; Notable for the following components:   WBC 13.2 (*)    Hemoglobin 12.4 (*)    HCT 37.1 (*)    MCV 78.1 (*)    RDW 18.7 (*)    Neutro Abs 11.7 (*)    Abs Immature Granulocytes 0.11 (*)    All other components within normal limits  PROTIME-INR - Abnormal; Notable for the following components:   Prothrombin Time 17.0 (*)    INR 1.4 (*)    All other components within normal limits  CULTURE, BLOOD (ROUTINE X 2)  CULTURE, BLOOD (ROUTINE X 2)  URINE CULTURE  RESP PANEL BY RT-PCR (FLU A&B, COVID) ARPGX2  LACTIC ACID, PLASMA  URINALYSIS, ROUTINE W REFLEX MICROSCOPIC  APTT    EKG EKG Interpretation  Date/Time:  Saturday October 21 2020 00:47:54 EST Ventricular Rate:  157 PR Interval:    QRS Duration: 122 QT Interval:  323 QTC Calculation: 522 R Axis:   -65 Text Interpretation: Sinus tachycardia Premature ventricular complexes Right bundle branch block When compared with ECG of 08/08/2020 Premature ventricular complexes are less frequent Confirmed by Dione Booze ([id number]) on 10/20/2020 1:03:55 AM   Radiology DG Chest 1 View  Result Date: 10/27/2020 CLINICAL DATA:  Suspected sepsis. EXAM: CHEST  1 VIEW COMPARISON:  08/09/2020 FINDINGS: The heart size and mediastinal contours are within normal limits. Both lungs are clear. The visualized skeletal structures are unremarkable. IMPRESSION: No active disease. Electronically Signed   By: Katherine Mantle M.D.   On: 11/02/2020 02:12    Procedures Procedures  CRITICAL CARE Performed by: Dione Booze Total critical care time: 60 minutes Critical care time was exclusive of separately billable procedures and treating other  patients. Critical care was necessary to treat or prevent imminent or life-threatening deterioration. Critical care was time spent personally by me on the following activities: development of treatment  plan with patient and/or surrogate as well as nursing, discussions with consultants, evaluation of patient's response to treatment, examination of patient, obtaining history from patient or surrogate, ordering and performing treatments and interventions, ordering and review of laboratory studies, ordering and review of radiographic studies, pulse oximetry and re-evaluation of patient's condition.  Medications Ordered in ED Medications  lactated ringers infusion (has no administration in time range)  lactated ringers bolus 1,000 mL (has no administration in time range)    And  lactated ringers bolus 1,000 mL (has no administration in time range)    And  lactated ringers bolus 250 mL (has no administration in time range)  ceFEPIme (MAXIPIME) 2 g in sodium chloride 0.9 % 100 mL IVPB (has no administration in time range)  ampicillin-sulbactam (UNASYN) 1.5 g in sodium chloride 0.9 % 100 mL IVPB (has no administration in time range)  vancomycin (VANCOCIN) IVPB 1000 mg/200 mL premix (has no administration in time range)  acetaminophen (TYLENOL) suppository 650 mg (has no administration in time range)    ED Course  I have reviewed the triage vital signs and the nursing notes.  Pertinent labs & imaging results that were available during my care of the patient were reviewed by me and considered in my medical decision making (see chart for details).  MDM Rules/Calculators/A&P Sepsis with probable urinary tract etiology.  Labs are consistent with severe dehydration - sodium 150, BUN 72, creatinine 3.98.  These are dramatic changes compared with 09/25/2020.  Elevated transaminases and bilirubin are noted but not significantly changed from prior.  Chest x-ray shows no obvious pneumonia, but with degree of  dehydration, pneumonia certainly could still be present.  Urinalysis is pending.  He is started on aggressive early, goal-directed fluids and started on empiric antibiotics.  Mild hyperkalemia is present without ECG changes, will give sodium zirconium cyclosilicate.  Case is discussed with Dr. Margo AyeHall of [facility], who agrees to admit the patient.  Final Clinical Impression(s) / ED Diagnoses Final diagnoses:  Sepsis due to undetermined organism (HCC)  Dehydration  Acute kidney injury (nontraumatic) (HCC)  Hypernatremia  Hyperkalemia  Elevated liver function tests    Rx / DC Orders ED Discharge Orders    None       Dione BoozeGlick, Chastity Noland, MD 08/14/2020 [phone]

## 2020-10-21 NOTE — Evaluation (Signed)
Clinical/Bedside Swallow Evaluation Patient Details  Name: Marcus Downs. MRN: [id number] Date of Birth: [birth year]  Today's Date: 10/24/2020 Time: SLP Start Time (ACUTE ONLY): 1515 SLP Stop Time (ACUTE ONLY): 1535 SLP Time Calculation (min) (ACUTE ONLY): 20 min  Past Medical History:  Past Medical History:  Diagnosis Date  . CRI (chronic renal insufficiency)   . 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 Diabetes mellitus type II 1992  . HLD (hyperlipidemia) 10/1998  . HTN (hypertension) 10/1998  . Primary open angle glaucoma    [name]  . RBBB   . Stroke [facility])    Left sided weakness   Past Surgical History:  Past Surgical History:  Procedure Laterality Date  . CATARACT EXTRACTION W/PHACO  09/09/2012   Procedure: CATARACT EXTRACTION PHACO AND INTRAOCULAR LENS PLACEMENT (IOC);  Surgeon: [name], MD;  Location: [facility] OR;  Service: Ophthalmology;  Laterality: Right;  . CATARACT EXTRACTION W/PHACO Left 03/31/2013   Procedure: CATARACT EXTRACTION PHACO AND INTRAOCULAR LENS PLACEMENT (IOC);  Surgeon: [name], MD;  Location: [facility] OR;  Service: Ophthalmology;  Laterality: Left;  . EYE SURGERY    . HIP ARTHROPLASTY Left 08/07/2020   Procedure: ARTHROPLASTY BIPOLAR HIP (HEMIARTHROPLASTY);  Surgeon: [name], MD;  Location: [facility] ORS;  Service: Orthopedics;  Laterality: Left;  . lipoma removal  1970's   Right shoulder  . US ECHOCARDIOGRAPHY  03/2013   mod LVH, EF 60%, normal wall motion   HPI:  Patient is a 79 y.o. male with PMH: CVA with residual dysphagia and hemiplegia, GERD, DM-2, HTN, HLD, weight loss, s/p hip hemiarthroplasty. He presented to hospital from SNF, s/p hip fracture and surgery 3 months ago and is now septic and being treated for UTI and hip abscess.   Assessment / Plan / Recommendation Clinical Impression  Patient presents with a severe cognitive based dysphagia at this time but with impact from prior history of dysphagia from  previous CVA. Patient was seen during last admission by speech therapy (September of 2021) and at that time he was on a Dys 1(puree) thin liquids diet with full supervision and strict precautions. During today's BSE, patient is agitated, moaning and groaning, keeping eyes closed and refusing to fully participate. His HR fluctuates from mid-140's to mid 160's. Patient did open mouth to allow for oral care, but he would quickly become agitated by this. (he continued to allow for oral care each time SLP presented toothette). SLP removed oral residuals which appeared to be mix of secretions with likely old PO's of medications, food that had not been cleared from mouth. Oral care at prior venue does not seem to have been completed adequately, although unable to determine if that is from patient non-compliance. SLP removed approximately 60% of residuals in oral cavity before patient eventually saying "no more". Patient to remain NPO but SLP will follow up for PO readiness. SLP Visit Diagnosis: Dysphagia, unspecified (R13.10)    Aspiration Risk  Severe aspiration risk;Risk for inadequate nutrition/hydration    Diet Recommendation NPO   Medication Administration: Via alternative means    Other  Recommendations Oral Care Recommendations: Oral care QID   Follow up Recommendations 24 hour supervision/assistance;Skilled Nursing facility      Frequency and Duration min 2x/week  1 week       Prognosis Prognosis for Safe Diet Advancement: Fair Barriers to Reach Goals: Time post onset;Severity of deficits      Swallow Study   General Date of Onset: 11/04/2020  HPI: Patient is a 79 y.o. male with PMH: CVA with residual dysphagia and hemiplegia, GERD, DM-2, HTN, HLD, weight loss, s/p hip hemiarthroplasty. He presented to hospital from SNF, s/p hip fracture and surgery 3 months ago and is now septic and being treated for UTI and hip abscess. Type of Study: Bedside Swallow Evaluation Previous Swallow  Assessment: from previous admission in September of 2021 Diet Prior to this Study: NPO Temperature Spikes Noted: Yes (99.3) History of Recent Intubation: No Behavior/Cognition: Confused;Agitated;Uncooperative;Requires cueing;Doesn't follow directions Oral Cavity Assessment: Dried secretions;Excessive secretions;Other (comment) (patient with what appears to be secretions mixed with PO's that had not been cleared from oral cavity with tongue coated yellowish brownish. Unable to visualize palate) Oral Care Completed by SLP: Yes Oral Cavity - Dentition: Edentulous Self-Feeding Abilities: Refused PO Patient Positioning: Upright in bed Baseline Vocal Quality: Other (comment);Normal Volitional Cough: Cognitively unable to elicit Volitional Swallow: Unable to elicit    Oral/Motor/Sensory Function Overall Oral Motor/Sensory Function: Other (comment) (difficult to fully assess secondary to patient's limited participation and compliance. He was able to open mouth for toothette sponge and demonstrated some lingual movement)   Ice Chips Ice chips: Not tested   Thin Liquid Thin Liquid: Not tested    Nectar Thick Nectar Thick Liquid: Not tested   Honey Thick Honey Thick Liquid: Not tested   Puree     Solid     Solid: Not tested     Angela Nevin, MA, CCC-SLP Speech Therapy [facility]

## 2020-10-21 NOTE — Progress Notes (Addendum)
eLink Physician-Brief Progress Note Patient Name: Marcus Downs. DOB: Mar 20, 1941 MRN: [id number]   Date of Service  11/11/2020  HPI/Events of Note  SVT vs Afib HR 150's, Lopressor D/C'd earlier today. Repeat K+ 6.0, day RN reported [name] given per order per NGT (unable to find documentation). Lactic acid improved 2.6 (was 4.4).  Discussed with [name], RN.  S/p IR HIP abscess drainage earlier. On and off goes into tachy. Not in pain. Has NG tube. On room air, sats ok. 94/57. No a fib hx.  Hg 9.5 earlier today.   eICU Interventions  - get EKG, Hg/Hct stat - Hyperkalemia Rx. - Lopressor 2.5 mg prn x 2 low dose IV.  - get troponin if ekg shows any ischemic changes.      Intervention Category Intermediate Interventions: Arrhythmia - evaluation and management;Diagnostic test evaluation  Ranee Gosselin 10/20/2020, 10:53 PM   2:07 EKG reviewed and compared to 4th, RBBB, freq PVC, prolonged qtc 533.  Lopressor helped for a while. Continue care. UOP good. Hg > 10.  Discussed with bed side RN.

## 2020-10-21 NOTE — Telephone Encounter (Signed)
Nurse from [employer] called to report that Marcus Downs has a K of 6.1 and BUN of 67, NA 148.  He remains lethargic and not able to swallow. He is being treated for a UTI and is receiving IVF. Due to his lab work and change in condition will send to the ER.

## 2020-10-21 NOTE — Consult Note (Signed)
[facility] for Infectious Diseases                                                                                        Patient Identification: Patient Name: Marcus Downs. MRN: [id number] Admit Date: 11/03/2020 12:39 AM Today's Date: 10/31/2020 Reason for consult:   Active Problems:   Severe sepsis (HCC)   Antibiotics: Cefepime 1 and Linezolid 1   Assessment Complex Fluid collection at the site of left Hip arthroplasty done in 08/07/20  Positive Staph aureus isolated in outside urine cultures - Patient has chronic foley's for urinary retention. At SNF, he was noted to be febrile, urine culture positive for MSSA.  He was started on Macrobid initially on 12/2, switched to Bactrim on 12/3.  Foley catheter was last exchanged 11/28. No sensitivities available at this time.   Hyperkalemia/AKI- in the setting of sepsis/dehydration, per primary  Recommendations  -Continue Linezolid for now given AKI picture. Might need to eventually convert to daptomycin given anticipated need for prolonged IV abx.  -Can de-escalate Meropenem to Ceftriaxone. I dont see any positive ESBLorganisms isolated in Epic. Dr Maylene Roes says meropenem was added for concerns of possible UTI with ESBL organisms. Please obtain urine culture reports from SNF -Ortho has been consulted who recommended IR guided aspiration of Left hip fluid collection. Please send sample for cultures when aspiration is done.  -ESR and CRP tomorrow -FU blood and urine cx  -Following   Rest of the management as per the primary team. Please call with questions or concerns.  Thank you for the consult  Rosiland Oz, MD Infectious [place] for Infectious Diseases  Pager [phone]  To contact the attending provider between 8A-5P or the covering provider during after hours 5P-8A, please log into the web site [email or web address] and access using universal  [place] password for that web site. If you do not have the password, please call the hospital operator. __________________________________________________________________________________________________________ HPI and Hospital Course: Marcus Downs is a 79 Y O male with a PMH of CVA with LUE weakness, essential hypertension, type 2 diabetes, GERD, dysphagia, hyperlipidemia, chronic urinary retention with indwelling foley catheter who presented to the ED today from SNF for concerns of UTI. history is obtained from chart review. Patient was at IR for procedure and non verbal per notes.At SNF, he was noted to be febrile, urine culture positive for MSSA.  He was started on Macrobid initially on 12/2, switched to Bactrim on 12/3.  Foley catheter was last exchanged 11/28. On the day of presentation he had increased lethargy, generalized weakness and a fever at SNF.    Work-up in the ED revealed severe sepsis secondary to presumed staph aureus UTI and AKI. Labs were remarkable for WBC 24.2 ( neutrophilic), Na 353, K 6, Cr 3.98 and deranged LFTs.  Blood cx and Urine cx pending. UA with >50 WBC   Chest xray with no acute pathology  CT abdomen/pelvus with Prominent fluid-like collection lateral to the LEFT hip arthroplasty hardware  ROS: unavailable at this time   Past Medical History:  Diagnosis Date  . CRI (chronic renal insufficiency)   .  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 Diabetes mellitus type II 1992  . HLD (hyperlipidemia) 10/1998  . HTN (hypertension) 10/1998  . Primary open angle glaucoma    [name]  . RBBB   . Stroke [facility])    Left sided weakness   Past Surgical History:  Procedure Laterality Date  . CATARACT EXTRACTION W/PHACO  09/09/2012   Procedure: CATARACT EXTRACTION PHACO AND INTRAOCULAR LENS PLACEMENT (IOC);  Surgeon: Marylynn Pearson, MD;  Location: [place];  Service: Ophthalmology;  Laterality: Right;  . CATARACT EXTRACTION W/PHACO Left  03/31/2013   Procedure: CATARACT EXTRACTION PHACO AND INTRAOCULAR LENS PLACEMENT (IOC);  Surgeon: Marylynn Pearson, MD;  Location: [place];  Service: Ophthalmology;  Laterality: Left;  . EYE SURGERY    . HIP ARTHROPLASTY Left 08/07/2020   Procedure: ARTHROPLASTY BIPOLAR HIP (HEMIARTHROPLASTY);  Surgeon: Dorna Leitz, MD;  Location[facility];  Service: Orthopedics;  Laterality: Left;  . lipoma removal  1970's   Right shoulder  . US ECHOCARDIOGRAPHY  03/2013   mod LVH, EF 60%, normal wall motion    Scheduled Meds: . (feeding supplement) PROSource Plus  30 mL Oral TID WC & HS  . brimonidine  1 drop Both Eyes TID  . heparin injection (subcutaneous)  5,000 Units Subcutaneous Q8H  . insulin aspart  0-6 Units Subcutaneous Q4H  . metoprolol tartrate  5 mg Intravenous Q6H  . sodium zirconium cyclosilicate  10 g Oral Once   Continuous Infusions: . dextrose 100 mL/hr at 10/26/2020 1021  . meropenem (MERREM) IV     PRN Meds:.acetaminophen, metoprolol tartrate  Allergies  Allergen Reactions  . Tape Rash    [place]   Social History   Socioeconomic History  . Marital status: Divorced    Spouse name: Not on file  . Number of children: 2  . Years of education: Not on file  . Highest education level: Not on file  Occupational History  . Occupation: Retired medically from [employer]  Tobacco Use  . Smoking status: Former Smoker    Packs/day: 1.00    Years: 30.00    Pack years: 30.00    Types: Cigarettes    Quit date: 09/07/1996    Years since quitting: 24.1  . Smokeless tobacco: Never Used  . Tobacco comment: quit after CVA  Vaping Use  . Vaping Use: Never used  Substance and Sexual Activity  . Alcohol use: No  . Drug use: No  . Sexual activity: Not Currently  Other Topics Concern  . Not on file  Social History Narrative   Caffeine: neg   Lives alone. Friend comes and helps sometimes. Son's brother in law helps with medicines. Family nearby - 2 sisters and nieces/nephews in area.    Sons live on other side of [place]       Ophtho - Dr. Venetia Maxon at [facility] [employer]   Social Determinants of Health   Financial Resource Strain:   . Difficulty of Paying Living Expenses: Not on file  Food Insecurity:   . Worried About Charity fundraiser in the Last Year: Not on file  . Ran Out of Food in the Last Year: Not on file  Transportation Needs:   . Lack of Transportation (Medical): Not on file  . Lack of Transportation (Non-Medical): Not on file  Physical Activity:   . Days of Exercise per Week: Not on file  . Minutes of Exercise per Session: Not on file  Stress:   .  Feeling of Stress : Not on file  Social Connections:   . Frequency of Communication with Friends and Family: Not on file  . Frequency of Social Gatherings with Friends and Family: Not on file  . Attends Religious Services: Not on file  . Active Member of Clubs or Organizations: Not on file  . Attends Archivist Meetings: Not on file  . Marital Status: Not on file  Intimate Partner Violence:   . Fear of Current or Ex-Partner: Not on file  . Emotionally Abused: Not on file  . Physically Abused: Not on file  . Sexually Abused: Not on file    Vitals BP 95/62   Pulse (!) 139   Temp (!) 100.8 F (38.2 C) (Rectal)   Resp 19   Ht 6' (1.829 m)   Wt 70 kg   SpO2 100%   BMI 20.93 kg/m   Patient was off to IR for hip aspiration, unable to examine. Will see tomorrow  Pertinent Microbiology Results for orders placed or performed during the hospital encounter of 10/29/2020  Resp Panel by RT-PCR (Flu A&B, Covid) Nasopharyngeal Swab     Status: None   Collection Time: 11/03/2020  2:46 AM   Specimen: Nasopharyngeal Swab; Nasopharyngeal(NP) swabs in vial transport medium  Result Value Ref Range Status   SARS Coronavirus 2 by RT PCR NEGATIVE NEGATIVE Final    Comment: (NOTE) SARS-CoV-2 target nucleic acids are NOT DETECTED.  The SARS-CoV-2 RNA is generally detectable in upper  respiratory specimens during the acute phase of infection. The lowest concentration of SARS-CoV-2 viral copies this assay can detect is 138 copies/mL. A negative result does not preclude SARS-Cov-2 infection and should not be used as the sole basis for treatment or other patient management decisions. A negative result may occur with  improper specimen collection/handling, submission of specimen other than nasopharyngeal swab, presence of viral mutation(s) within the areas targeted by this assay, and inadequate number of viral copies(<138 copies/mL). A negative result must be combined with clinical observations, patient history, and epidemiological information. The expected result is Negative.  Fact Sheet for Patients:  EntrepreneurPulse.com.au  Fact Sheet for Healthcare Providers:  IncredibleEmployment.be  This test is no t yet approved or cleared by the [place] FDA and  has been authorized for detection and/or diagnosis of SARS-CoV-2 by FDA under an Emergency Use Authorization (EUA). This EUA will remain  in effect (meaning this test can be used) for the duration of the COVID-19 declaration under Section 564(b)(1) of the Act, 21 U.S.C.section 360bbb-3(b)(1), unless the authorization is terminated  or revoked sooner.       Influenza A by PCR NEGATIVE NEGATIVE Final   Influenza B by PCR NEGATIVE NEGATIVE Final    Comment: (NOTE) The Xpert Xpress SARS-CoV-2/FLU/RSV plus assay is intended as an aid in the diagnosis of influenza from Nasopharyngeal swab specimens and should not be used as a sole basis for treatment. Nasal washings and aspirates are unacceptable for Xpert Xpress SARS-CoV-2/FLU/RSV testing.  Fact Sheet for Patients: EntrepreneurPulse.com.au  Fact Sheet for Healthcare Providers: IncredibleEmployment.be  This test is not yet approved or cleared by the [place] FDA and has been  authorized for detection and/or diagnosis of SARS-CoV-2 by FDA under an Emergency Use Authorization (EUA). This EUA will remain in effect (meaning this test can be used) for the duration of the COVID-19 declaration under Section 564(b)(1) of the Act, 21 U.S.C. section 360bbb-3(b)(1), unless the authorization is terminated or revoked.  Performed at [facility], [place] [address], [place], [place] [address]     Pertinent Lab seen by me: CBC Latest Ref Rng & Units 10/29/2020 10/20/2020 09/25/2020  WBC 4.0 - 10.5 K/uL - 13.2(H) 10.5  Hemoglobin 13.0 - 17.0 g/dL 9.5(L) 12.4(L) 14.6  Hematocrit 39 - 52 % 28.0(L) 37.1(L) 43  Platelets 150 - 400 K/uL - 268 383   CMP Latest Ref Rng & Units 11/01/2020 11/15/2020 10/18/2020  Glucose 70 - 99 mg/dL 241(H) - -  BUN 8 - 23 mg/dL 73(H) - -  Creatinine 0.61 - 1.24 mg/dL 3.65(H) - -  Sodium 135 - 145 mmol/L 146(H) 149(H) 151(H)  Potassium 3.5 - 5.1 mmol/L 5.3(H) - 5.9(H)  Chloride 98 - 111 mmol/L 116(H) - -  CO2 22 - 32 mmol/L 17(L) - -  Calcium 8.9 - 10.3 mg/dL 8.0(L) - -  Total Protein 6.5 - 8.1 g/dL - - -  Total Bilirubin 0.3 - 1.2 mg/dL - - -  Alkaline Phos 38 - 126 U/L - - -  AST 15 - 41 U/L - - -  ALT 0 - 44 U/L - - -     Pertinent Imagings/Other Imagings Plain films and CT images have been personally visualized and interpreted; radiology reports have been reviewed. Decision making incorporated into the Impression / Recommendations.  Left Lower extremity US 11/16/2020 FINDINGS: Scanning in the area of clinical concern adjacent to the left hip prosthesis demonstrates a complex fluid collection likely representing a postoperative seroma/hematoma. Possibility of underlying infection could not be totally excluded on the basis of this exam. This corresponds to soft tissue density lateral to the proximal femur on recent CT examination.  IMPRESSION: Complex fluid collection as described likely representing postoperative hematoma/seroma.  This extends for at least 12 cm along the lateral aspect of the left thigh.      CT abdomen/pelvis 11/02/2020  FINDINGS: Lower chest: Mild bibasilar atelectasis.  Hepatobiliary: No focal liver abnormality is seen. Gallbladder is unremarkable. No bile duct dilatation.  Pancreas: Partially infiltrated with fat but otherwise unremarkable. No peripancreatic fluid.  Spleen: Unremarkable, although evaluation is limited by motion artifact.  Adrenals/Urinary Tract: Adrenal glands are unremarkable. Kidneys are unremarkable without stone or hydronephrosis. No perinephric fluid. Bladder is decompressed by Foley catheter.  Stomach/Bowel: No dilated large or small bowel loops. No convincing evidence of an acute bowel wall inflammation. Stomach is unremarkable, partially decompressed.  Vascular/Lymphatic: Aortic atherosclerosis. No enlarged lymph nodes are seen in the abdomen or pelvis.  Reproductive: Prostate is unremarkable.  Other: No free fluid or abscess collection is seen within the abdomen or pelvis. No free intraperitoneal air.  Musculoskeletal: Degenerative spondylosis of the lumbar spine, moderate in degree. No acute appearing osseous abnormality. LEFT hip arthroplasty hardware in place. Prominent fluid-like collection lateral to the LEFT hip arthroplasty hardware, incompletely imaged, of uncertain age, containing small foci of air.  IMPRESSION: 1. Prominent fluid-like collection lateral to the LEFT hip arthroplasty hardware, incompletely imaged, of uncertain age but small foci of air are seen within the collection which could indicate acute abscess. Alternatively, this could represent chronic seroma or hematoma. Consider further characterization with ultrasound. 2. No additional acute or significant findings within the abdomen or pelvis. No bowel obstruction or evidence of an acute bowel wall inflammation. No free fluid or abscess collection is seen within  the abdomen or pelvis. No evidence of acute solid organ abnormality. No renal or ureteral stone.  Aortic Atherosclerosis (ICD10-I70.0).   I have spent greater than 60 minutes for  this patient encounter including review of prior medical records with greater than 50% of time being face to face and coordination of their care.

## 2020-10-21 NOTE — ED Notes (Signed)
Pt awake, moving around. Have replaced O2 sat monitor on 5 different locations. Remain unable to obtain reliable O2 sat. Pts color good.

## 2020-10-21 NOTE — ED Triage Notes (Signed)
Patient arrives with [facility] from [facility], patient has a chronic foley that was exchanged 3-4 days ago for concern for UTI, patient was started on Bactrim today, facility was concerned for sepsis, labs of BUN 69.4, Creat 3.35, K+ 6.1, patient given 700 cc of IVF, EMS gave an additional 500cc NS  EMS vitals  HR 170  90/40 BP 50 RR 99.5 axiliary 99% RA

## 2020-10-21 NOTE — Progress Notes (Signed)
Pharmacy Antibiotic Note  Marcus Downs. is a 79 y.o. male admitted on 11/10/2020 with AMS/sepsis.  Pharmacy has been consulted for Meropenem dosing.  Zyvox ordered due to AKI.  Have verified Zyvox 600 mg IV x 1 dose--further doses will require ID approval   Plan: Meropenem 500 mg IV q12h   Height: 6' (182.9 cm) Weight: 70 kg (154 lb 5.2 oz) IBW/kg (Calculated) : 77.6  Temp (24hrs), Avg:99.7 F (37.6 C), Min:98.2 F (36.8 C), Max:101.1 F (38.4 C)  Recent Labs  Lab 10/25/2020 0100 10/27/2020 0102 11/14/2020 0226  WBC 13.2*  --   --   CREATININE 3.98*  --   --   LATICACIDVEN  --  2.8* 2.6*    Estimated Creatinine Clearance: 15.1 mL/min (A) (by C-G formula based on SCr of 3.98 mg/dL (H)).    Allergies  Allergen Reactions  . Tape Rash    [place]   Eddie Candle 11/17/2020 3:23 AM

## 2020-10-21 NOTE — ED Notes (Signed)
CHECKED PATIENT CBG IT WAS 141 NOTICED Rn OF BLOOD SUGAR PATIENT IS RESTING WITH CALL BELL IN REACH

## 2020-10-21 NOTE — ED Notes (Signed)
Pt transferred to IR for hip drain placement

## 2020-10-21 NOTE — H&P (Addendum)
History and Physical  Marcus Downs. XBD:[id number] DOB: 05-13-1941 DOA: 11/07/2020  Referring physician: Dr. Preston Fleeting, EDP PCP: [name], MD  Outpatient Specialists: None Patient coming from: SNF  Chief Complaint: Concern for UTI from SNF  HPI: Marcus Downs. is a 79 y.o. male with medical history significant for prior CVA, essential hypertension, type 2 diabetes, GERD, dysphagia, hyperlipidemia, chronic urinary retention with indwelling foley catheter, who presented to [facility] ED from SNF due to increased lethargy and concern for UTI from SNF.  Unable to obtain a history from the patient due to nonverbal.  History is mainly obtained from EDP and review of medical records. Patient was seen by primary care provider on 10/19/2020 for fever related to staph aureus UTI.  Urine culture positive for staph aureus on 10/19/2020. On the day of presentation he had increased lethargy, generalized weakness and a fever at SNF.  Work-up in the ED revealed severe sepsis secondary to presumed staph aureus UTI and AKI. TRH asked to admit.  ED Course: T-max 101.1, heart rate 158, respiratory rate 45 with soft BPs. O2 saturation 98% on room air. Lab studies remarkable for WBC 13.2K, neutrophilia, serum sodium 150, serum potassium 6.0, serum bicarb 20, anion gap 13, creatinine 3.98, elevated LFTs, T bili 1.6.  Review of Systems: Review of systems as noted in the HPI. All other systems reviewed and are negative.   Past Medical History:  Diagnosis Date  . CRI (chronic renal insufficiency)   . 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 Diabetes mellitus type II 1992  . HLD (hyperlipidemia) 10/1998  . HTN (hypertension) 10/1998  . Primary open angle glaucoma    [name]  . RBBB   . Stroke [facility])    Left sided weakness   Past Surgical History:  Procedure Laterality Date  . CATARACT EXTRACTION W/PHACO  09/09/2012   Procedure: CATARACT EXTRACTION PHACO AND INTRAOCULAR LENS  PLACEMENT (IOC);  Surgeon: [name], MD;  Location: [facility] OR;  Service: Ophthalmology;  Laterality: Right;  . CATARACT EXTRACTION W/PHACO Left 03/31/2013   Procedure: CATARACT EXTRACTION PHACO AND INTRAOCULAR LENS PLACEMENT (IOC);  Surgeon: [name], MD;  Location: [facility] OR;  Service: Ophthalmology;  Laterality: Left;  . EYE SURGERY    . HIP ARTHROPLASTY Left 08/07/2020   Procedure: ARTHROPLASTY BIPOLAR HIP (HEMIARTHROPLASTY);  Surgeon: [name], MD;  Location: [facility] ORS;  Service: Orthopedics;  Laterality: Left;  . lipoma removal  1970's   Right shoulder  . US ECHOCARDIOGRAPHY  03/2013   mod LVH, EF 60%, normal wall motion    Social History:  reports that he quit smoking about 24 years ago. His smoking use included cigarettes. He has a 30.00 pack-year smoking history. He has never used smokeless tobacco. He reports that he does not drink alcohol and does not use drugs.   Allergies  Allergen Reactions  . Tape Rash    Burns    Family History  Problem Relation Age of Onset  . Heart attack Father 32  . Hypertension Sister        Multiple medical problems  . Diabetes Sister   . Stroke Neg Hx   . Cancer Neg Hx       Prior to Admission medications   Medication Sig Start Date End Date Taking? Authorizing Provider  acetaminophen (TYLENOL) 325 MG tablet Take 650 mg by mouth every 6 (six) hours as needed for mild pain or fever. 08/14/20  Yes [provider]  Amino Acids-Protein Hydrolys (FEEDING SUPPLEMENT, PRO-STAT 64,) LIQD Take 30 mLs by mouth in the morning and at bedtime. For wound healing 08/15/20  Yes [provider]  atorvastatin (LIPITOR) 40 MG tablet Take 1 tablet (40 mg total) by mouth daily. 08/14/20  Yes Rhetta MuraSamtani, Jai-Gurmukh, MD  bisacodyl (DULCOLAX) 10 MG suppository Place 10 mg rectally as needed for moderate constipation.   Yes [provider]  brimonidine (ALPHAGAN P) 0.1 % SOLN Place 1 drop into both eyes 3 (three) times daily.   Yes [provider]  ferrous sulfate 325 (65 FE) MG tablet Take 325 mg by mouth daily.  08/31/18  Yes Eustaquio BoydenGutierrez, Javier, MD  Insulin Aspart Prot & Aspart (NOVOLOG MIX 70/30 FLEXPEN [place]) Inject into the skin in the morning, at noon, and at bedtime. Inject 14 Units after breakfast, Inject 16 units after dinner, Inject 18 units after lunch.   Yes [provider]  latanoprost (XALATAN) 0.005 % ophthalmic solution Place 1 drop into both eyes at bedtime.   Yes [provider]  magnesium hydroxide (MILK OF MAGNESIA) 400 MG/5ML suspension Take 30 mLs by mouth daily as needed for mild constipation.    Yes [provider]  metoprolol tartrate (LOPRESSOR) 25 MG tablet Take 1 tablet (25 mg total) by mouth 2 (two) times daily. 08/14/20  Yes Rhetta MuraSamtani, Jai-Gurmukh, MD  MULTIPLE VITAMIN PO Take 1 tablet by mouth daily. W/ Minerals to promote wound healing   Yes [provider]  NON FORMULARY 120mL Medpass TID d/t weight loss. 09/13/20  Yes [provider]  potassium chloride (KLOR-CON) 10 MEQ tablet TAKE 3 TABLETS BY MOUTH DAILY 09/11/20  Yes Eustaquio BoydenGutierrez, Javier, MD  aspirin 325 MG tablet Take 325 mg by mouth daily.    [provider]  nitrofurantoin (MACRODANTIN) 100 MG capsule Take 100 mg by mouth 2 (two) times daily. For UTI 10/19/20 10/23/20  [provider]    Physical Exam: BP (!) 107/95   Pulse (!) 157   Temp (!) 101.1 F (38.4 C) (Rectal)   Resp (!) 45   Ht 6' (1.829 m)   Wt 70 kg   SpO2 (!) 77%   BMI 20.93 kg/m   . General: 79 y.o. year-old male Emaciated, lethargic.  Confused.  Non verbal. . Cardiovascular: Tachycardic with no rubs or gallops.  No thyromegaly or JVD noted.  No lower extremity edema. 2/4 pulses in all 4 extremities. Marland Kitchen. Respiratory: Clear to auscultation with no wheezes or rales. Poor inspiratory effort. . Abdomen: Soft nontender nondistended with normal bowel sounds x4 quadrants. . Muskuloskeletal: No cyanosis, clubbing or edema  noted bilaterally . Neuro: CN II-XII intact, no new neurological deficits.  Sensory intact. . Skin: No ulcerative lesions noted or rashes . Psychiatry: Judgement and insight are altered. Mood is appropriate for condition and setting          Labs on Admission:  Basic Metabolic Panel: Recent Labs  Lab 2020/10/10 0100  NA 150*  K 6.0*  CL 117*  CO2 20*  GLUCOSE 131*  BUN 72*  CREATININE 3.98*  CALCIUM 8.3*   Liver Function Tests: Recent Labs  Lab 2020/10/10 0100  AST 123*  ALT 58*  ALKPHOS 203*  BILITOT 1.6*  PROT 8.1  ALBUMIN 1.5*   No results for input(s): LIPASE, AMYLASE in the last 168 hours. No results for input(s): AMMONIA in the last 168 hours. CBC: Recent Labs  Lab 2020/10/10 0100  WBC 13.2*  NEUTROABS 11.7*  HGB 12.4*  HCT  37.1*  MCV 78.1*  PLT 268   Cardiac Enzymes: No results for input(s): CKTOTAL, CKMB, CKMBINDEX, TROPONINI in the last 168 hours.  BNP (last 3 results) No results for input(s): BNP in the last 8760 hours.  ProBNP (last 3 results) No results for input(s): PROBNP in the last 8760 hours.  CBG: No results for input(s): GLUCAP in the last 168 hours.  Radiological Exams on Admission: DG Chest 1 View  Result Date: 10/20/2020 CLINICAL DATA:  Suspected sepsis. EXAM: CHEST  1 VIEW COMPARISON:  08/09/2020 FINDINGS: The heart size and mediastinal contours are within normal limits. Both lungs are clear. The visualized skeletal structures are unremarkable. IMPRESSION: No active disease. Electronically Signed   By: Katherine Mantle M.D.   On: 10/20/2020 02:12    EKG: I independently viewed the EKG done and my findings are as followed: Sinus tachycardia rate of 157. Nonspecific ST-T changes. QTc 522.  Assessment/Plan Present on Admission: . Severe sepsis (HCC)  Active Problems:   Severe sepsis (HCC)  Severe sepsis secondary to presumed staph aureus UTI Presented with T-max 101.1, heart rate 158, respiratory rate 45 with soft BPs. Received  3 L of IV fluid boluses, currently on lactated Ringer's at 150 cc/h Patient was seen by primary care provider on 10/19/2020 for fever related to staph aureus UTI, urine culture positive for staph aureus on 10/19/2020. Received IV vancomycin in the ED, switched to linezolid due to AKI. Add meropenem for severe sepsis and possible ESBL De-escalate antibiotics when cultures result Blood cultures in process Urine culture pending, follow results Maintain MAP greater than 65  Acute metabolic encephalopathy secondary to severe sepsis Lethargic on exam Obtain VBG to assess pH and PCO2 Ammonia level, vitamin B12 level Continue IV fluid and empiric IV antibiotics  Sinus tachycardia in the setting of severe sepsis Heart rate in the 150s Continue aggressive IV fluid hydration Closely monitor on progressive unit He is on Lopressor prior to admission, held due to soft blood pressures, consider restarting once BP has improved to avoid beta-blocker withdrawal  Hyperkalemia in the setting of AKI Baseline creatinine appears to be 0.8 with GFR greater than 90 Presented with creatinine of 3.98 with GFR 15 No peaked T waves on twelve-lead EKG Lokelma ordered by EDP however since patient is lethargic and unable to swallow safely will treat with IV insulin 10 units x 1 dose plus D50 with close monitoring of CBGs every 2 hours x 6 occurrences. Repeat BMP in 2 hours  Prolonged QTC QTC 522 on 12 lead EKG 11/06/2020 Avoid QTC prolonging agents Repeat 12 lead EKG and monitor QTC  Hypovolemic hypernatremia Presented with serum sodium 150 Continue IV fluid hydration Follow BMP  AKI likely prerenal in the setting of dehydration Presented with BUN 72, creatinine 3.98 Baseline creatinine 0.8 with GFR greater than 90  Anion gap metabolic acidosis secondary to renal insufficiency and elevated lactic acid Presented with serum bicarb 20, anion gap 13, cr 3.98, and lactic acid 2.8 Continue IV fluid and follow  repeat renal panel  Acute transaminitis Elevated LFTs on presentation T bili 1.6 Get CT abdomen/pelvis noncontrast Avoid hepatotoxic agents Repeat CMP in the morning  History of CVA Resume home ASA and Lipitor when no longer NPO  Acute drop in hemoglobin Presented with Hg 12K, now 9.5K  Suspect dilutional Monitor H&H No overt bleeding at this time  Type 2 diabetes Last hemoglobin A1c 7.7 on 08/06/2020 Currently n.p.o. due to lethargy Insulin sliding scale q4H since NPO  Severe  protein calorie malnutrition BMI 20, albumin 1.5 on 11/09/2020 Start oral supplement Speech therapist consulted for swallow evaluation  History of dysphagia NPO until more alert and passes swallow evaluation    DVT prophylaxis: Subcu heparin 3 times daily  Code Status: Full code  Family Communication: None at bedside.  Disposition Plan: Admit to stepdown unit.  Consults called: None.  Admission status: Inpatient status.  Patient will require at least 2 midnights for further evaluation and treatment of present condition.   Status is: Inpatient    Dispo:  Patient From: Skilled Nursing Facility  Planned Disposition: Skilled Nursing Facility  Expected discharge date: 10/24/20  Medically stable for discharge: No, ongoing management of severe sepsis secondary to presumed staph aureus UTI, acute metabolic encephalopathy, and AKI.        Darlin Drop MD [facility] Pager [phone]  If 7PM-7AM, please contact night-coverage [email or web address] Password [email or web address]  11/05/2020, 2:48 AM

## 2020-10-21 NOTE — Procedures (Signed)
Interventional Radiology Procedure:   Indications: Sepsis with left thigh/hip abscess  Procedure: CT guided abscess drain placement  Findings: Pinkish purulent fluid aspirated.  12 Fr drain placed and 180 ml of fluid removed.  Compatible with left thigh/hip abscess  Complications: None     EBL: less than 10 ml  Plan: Send fluid for culture.  Follow output.  Will eventually need follow up CT to assess for additional abscess areas, particularly in left gluteal musculature.  Recommend ortho consult because assume the left hip arthroplasty is infected.     Breya Cass R. Lowella Dandy, MD  Pager: [phone]

## 2020-10-21 NOTE — Consult Note (Addendum)
NAME:  Marcus Downs., MRN:  [id number], DOB:  08-21-1941, LOS: 0 ADMISSION DATE:  2020-11-06, CONSULTATION DATE: 12/4 REFERRING MD: Dr. 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 Luiz Blare 07/2020. He was sent to the ED from his nursing home for worsening altered mental status, agitation, and findings of AKI with electrolyte abnormalities.  History of present illness   Patient transferred from [facility] for worsening systemic symptoms thought to be secondary to UTI. He was treated for a UTI 09/28/20 and completed 7 day course of cipro. 11/29 the patient was found to be altered with a fever, increased fatigue,decreased functionality, and yellow urine with sediment present. At that time, urine cultures were positive for staph aureus. Other pertinent lab findings of AKI with cr of 1.26, Na of 153. WBC of 10.4. Hgb of 11.9, MCV of 73.8. Nitrofurantoin was started for 5 days and switched to bactrim the following day. Foley catheter was changed 4 days ago. His mental status continued to worsen, patient became non-verbal so he was sent to the ED early this am.  Conversation with staff at [facility] reveal baseline mentation of waxing and waning orientation due to dementia. Mentions often confused but easily redirectable. Able to recognize familiar faces but unable to express needs appropriately. Family also mentions that Marcus Downs's mentation has been in steady decline since his stroke and he has become increasingly confused during the last month.  ED Course: Patient found to be tachycardic, hypotensive. Lab work revealing BUN of 72 and Cr of 3.98, Na of 150, K of 6.1. He was admitted to hospitalist service for severe sepsis  secondary to MSSA CAUTI. CT A/P revealed prominent fluid collection of the left hip, lateral to arthroplasty hardware. [place] was performed of the area, revealed complex fluid collection. Ortho/IR were consulted as area was suspicious for left hip abscess.   PCCM was consulted due to patient's worsening sepsis with possible need for pressors.   Past Medical History   Past Medical History:  Diagnosis Date  . CRI (chronic renal insufficiency)   . 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 Diabetes mellitus type II 1992  . HLD (hyperlipidemia) 10/1998  . HTN (hypertension) 10/1998  . Primary open angle glaucoma    [name]  . RBBB   . Stroke [facility])    Left sided weakness   Significant Hospital Events   12/4 Admitted  Consults:  PCCM  Procedures:  12/4 Left Hip Fluid Collection Drain  Significant Diagnostic Tests:  CXR 12/4 - No active disease CT A/P w/o C 12/4 - prominent fluid like collection lateral to left hip hardware, indicative of acute abscess, but unable to rule out chronic seroma/hematoma LE [place] 12/4 - complex fluid collection left hip, postoperative hematoma/seroma. 12 cm along lateral aspect of the left thigh  Micro Data:  Prior to admission: [facility] Note, [facility] 12/02 + for staph aureus BC x 2 12/4 - Pending UC 12/4 - Pending  Antimicrobials:  12/04 - Cefepime/Linezolid. Both DC'd changed to ceftriaxone by ID  Interim history/subjective:  At the time of our examination, patient is moving in the bed, grabbing at lines, and moaning. Unable to provide history.  Objective   Blood pressure 112/60, pulse (!) 26, temperature 99.6 F (37.6 C), temperature source Rectal, resp. rate (!) 35, height 6' (1.829 m), weight 70 kg, SpO2 (!) 0 %.        Intake/Output Summary (Last 24 hours) at 11/16/2020 1409 Last data filed at 11/16/2020 1313 Gross per 24 hour  Intake 950 ml  Output 180 ml  Net 770 ml   Filed Weights   2020/07/24 0139  Weight: 70  kg    Examination: General: Agitated. Moaning.  HENT: Normocephalic, Atraumatic Lungs: CTA Cardiovascular: Tachycardi. No murmurs, gallops, rubs Abdomen: Soft, nondistended.  Extremities: Warm. No edema Neuro: Non-verbal.  GU: Foley catheter in place, amber urine in bag.   Assessment & Plan:   Severe Septic Shock Suspected Left Hip Abscess S/P left hip arthroplasty 07/2020 Catheter Associated UTI Patient presented to the ED with worsening altered mental status, febrile state, hypotension, tachycardia, leukocytosis, lactic acidosis. AKI present as well as elevated liver enzymes. Patient meets criteria for sepsis at this time, source of infection possible left hip fluid collection (abscess v hematoma v seroma) vs UTI. Low suspicion for pyelonephritis given unremarkable kidneys on imaging. Patient's blood pressure improving with IV fluids, not requiring pressors at this time. ID following, abx started, currently on ceftriaxone 2g daily. Ortho consulted, would like IR to place drain. If purulent fluid, will leave drain in place and ortho to follow. Appreciate all consults recommendations - Ceftriaxone started 12/4, ID following - Pt currently with IR, CT guided drain placement - Ortho consulted, patient may need to be taken to OR for wash out - LR infusion 100 ml/hr - Trend Lactic Acid - Hold home anti-hypertensives  Acute Metabolic Encephalopathy History of CVA Patient non verbal at this time, moaning in bed. Responds to pain. Discussion had with patient's son who states baseline is alert and oriented to self and familiar people. Suspect etiology is severe sepsis secondary to infections as documented above.  AKI Prerenal Secondary to Sepsis Hypernatremia Hyperkalemia Cr improving, last 3.65. Baseline appears to be below 1. Suspect secondary to sepsis, pre-renal in nature. Na of 146 improving. K of 5.3 improving, given lokelma while in ED. No EKG changes noted. Foley catheter in place,  patient producing urine. Given 40 lasix while in ED.  - Continue to trend BMP's - Continue LR, D5 discontinued - Avoid nephrotoxins  Microcytic Anemia History of iron deficiency anemia. Baseline Hgb of 12.3, appears to be at baseline. Will not give iron in context of active infection.  - Daily CBC - Transfuse Hgb <7  Elevated Liver Enzymes Appears to be secondary to septic shock. - Trend LFT's - Continue LR IV fluids  T2DM  Last A1c of 8.1 - SSI  Hx of Dysphagia NPO at this time, needs speech evaluation due to altered state.  - Speech Eval  Best practice:  Diet: NPO Pain/Anxiety/Delirium protocol (if indicated): tylenol 650 mg oral/rectal mild pain/fever, dilaudid .5 mg q3h PRN severe pain. VAP protocol (if indicated): None DVT prophylaxis: SCD GI prophylaxis: Protonix Glucose control: SSI Mobility: Bed Rest Code Status: Full Family Communication: Spoke with son for prolonged period regarding current admission. Son mentions that he understands that MarcusRamos has multiple co-morbidities that puts him at high risk for decompensation. He mentions that for the moment he would like MarcusJelinski to be full code but mentions he will update us if other family members has differing opinion. Disposition: MICU  Labs   CBC: Recent Labs  Lab 2020/07/24 0100 2020/07/24 0404  WBC 13.2*  --   NEUTROABS 11.7*  --   HGB 12.4* 9.5*  HCT 37.1* 28.0*  MCV 78.1*  --   PLT 268  --     Basic Metabolic Panel: Recent Labs  Lab 11/08/2020 0100 10/20/2020 0400 11/16/2020 0404 10/30/2020 0722 11/01/2020 1010  NA 150* 151* 151* 149* 146*  K 6.0* 6.3* 5.9*  --  5.3*  CL 117* 119*  --   --  116*  CO2 20* 15*  --   --  17*  GLUCOSE 131* 133*  --   --  241*  BUN 72* 74*  --   --  73*  CREATININE 3.98* 3.83*  --   --  3.65*  CALCIUM 8.3* 8.2*  --   --  8.0*   GFR: Estimated Creatinine Clearance: 16.5 mL/min (A) (by C-G formula based on SCr of 3.65 mg/dL (H)). Recent Labs  Lab 11/13/2020 0100  10/22/2020 0102 10/28/2020 0226 11/09/2020 0400 11/15/2020 0722 11/02/2020 1010  PROCALCITON  --   --   --   --  11.07  --   WBC 13.2*  --   --   --   --   --   LATICACIDVEN  --    < > 2.6* 3.8* 2.6* 3.1*   < > = values in this interval not displayed.    Liver Function Tests: Recent Labs  Lab 10/27/2020 0100  AST 123*  ALT 58*  ALKPHOS 203*  BILITOT 1.6*  PROT 8.1  ALBUMIN 1.5*   No results for input(s): LIPASE, AMYLASE in the last 168 hours. Recent Labs  Lab 10/24/2020 0722  AMMONIA <9*    ABG    Component Value Date/Time   HCO3 15.5 (L) 11/03/2020 0404   TCO2 16 (L) 11/02/2020 0404   ACIDBASEDEF 7.0 (H) 11/07/2020 0404   O2SAT 79.0 11/10/2020 0404     Coagulation Profile: Recent Labs  Lab 11/15/2020 0100  INR 1.4*    Cardiac Enzymes: No results for input(s): CKTOTAL, CKMB, CKMBINDEX, TROPONINI in the last 168 hours.  HbA1C: Hgb A1c MFr Bld  Date/Time Value Ref Range Status  11/03/2020 01:00 AM 8.1 (H) 4.8 - 5.6 % Final    Comment:    (NOTE) Pre diabetes:          5.7%-6.4%  Diabetes:              >6.4%  Glycemic control for   <7.0% adults with diabetes   08/06/2020 06:01 PM 7.7 (H) 4.8 - 5.6 % Final    Comment:    (NOTE) Pre diabetes:          5.7%-6.4%  Diabetes:              >6.4%  Glycemic control for   <7.0% adults with diabetes     CBG: Recent Labs  Lab 10/20/2020 0644 10/18/2020 0908  GLUCAP 165* 141*    Vasili Katsadouros Internal Medicine Resident PGY-1 [place]   Patient seen in conjunction with medical resident.  Agree with documentation above.  PCCM attending:  This is a 79 year old, past medical history of vascular dementia, prior CVA, left residual weakness, hypertension, type 2 diabetes history of urinary retention, Foley catheter chronically.  Had a left hemiarthroplasty by Dr. Luiz Blare in September 2021.  Patient presented from the nursing home with a worsening altered mental status and agitation and renal failure.  Patient  received volume resuscitation in the ER.  CT imaging revealed fluid collection with air around the left hip.  Patient went to interventional radiology for percutaneous JP drain placement which revealed purulent fluid.  Orthopedic surgery has been consulted regarding findings.  BP (!) 108/57 (BP Location: Right Arm)   Pulse (!) 166   Temp 99.3 F (37.4 C) (Axillary)   Resp (!) 30   Ht 6' (1.829 m)   Wt 70 kg   SpO2 100%   BMI 20.93 kg/m   General: This is a elderly gentleman, confused, appears to protect airway opens eyes to voice. HEENT: Tracking appropriately, mucous membranes dry Heart: Regular rhythm, S1-S2, tachycardic Lungs: Clear, no crackles no wheeze Abdomen: Soft nontender nondistended Extremities: Pain with palpation around the left hip site.  He grimaces and moans.  Labs: Reviewed  Assessment: Septic shock status post fluid resuscitation, present on admission Left to septic hip with loculated fluid collection status post JP drain Possible urinary tract infection, chronic Foley present on admission AKI Acute metabolic encephalopathy secondary to above History of CVA, vascular dementia at baseline Hyperkalemia secondary to above of  Plan: Continue broad-spectrum antibiotics ID has already seen the patient continue ceftriaxone plus linezolid BC ID blood cultures with staph Status post fluid resuscitation Continue lactated Ringer's at 100 cc an hour No longer requiring fluid boluses, pressure has stabilized. Can start low-dose peripheral Levophed if needed Holding home antihypertensives. Avoid nephrotoxic agents Follow electrolytes and urine output. Please exchange Foley.  This patient is critically ill with multiple organ system failure; which, requires frequent high complexity decision making, assessment, support, evaluation, and titration of therapies. This was completed through the application of advanced monitoring technologies and extensive interpretation of  multiple databases. During this encounter critical care time was devoted to patient care services described in this note for 36 minutes.  Josephine Igo, DO [facility] 11/15/20 4:25 PM

## 2020-10-21 NOTE — ED Notes (Signed)
Report called to Sarah, RN

## 2020-10-21 NOTE — Consult Note (Addendum)
Chief Complaint: Patient was seen in consultation today for image guided aspiration/possible drainage of left hip fluid collection Chief Complaint  Patient presents with  . Code Sepsis    Referring Physician(s): Choi,J/Icard,B   Supervising Physician: Richarda OverlieHenn, Adam  [facility]  History of Present Illness: Marcus LankSamuel J Elbe Jr. is a 79 y.o. 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 Urine culture was positive for MSSA. He has been on antibiotics. In ED patient was febrile, tachycardic and with leukocytosis-WBC 13.2, along with hyponatremia, AKI and hyperkalemia. He is status post bipolar left hip hemiarthroplasty on 08/07/2020;  patient is ill-appearing, moaning and essentially nonverbal.  CT of the abdomen pelvis revealed: .   Prominent fluid-like collection lateral to the LEFT hip arthroplasty hardware, incompletely imaged, of uncertain age but small foci of air are seen within the collection which could indicate acute abscess. Alternatively, this could represent chronic seroma or hematoma. Consider further characterization with ultrasound. 2. No additional acute or significant findings within the abdomen or pelvis. No bowel obstruction or evidence of an acute bowel wall inflammation. No free fluid or abscess collection is seen within the abdomen or pelvis. No evidence of acute solid organ abnormality. No renal or ureteral stone.  Aortic Atherosclerosis   Ultrasound left hip/thigh region revealed: Complex fluid collection as described likely representing postoperative hematoma/seroma. This extends for at least 12 cm along the lateral aspect of the left  thigh  Lactic acid 3.1, COVID 19 neg; blood/urine cultures pending; request now received from primary team for image guided aspiration possible drainage of the left hip fluid collection. Past Medical History:  Diagnosis Date  . CRI (chronic renal insufficiency)   . 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 Diabetes mellitus type II 1992  . HLD (hyperlipidemia) 10/1998  . HTN (hypertension) 10/1998  . Primary open angle glaucoma    [name]  . RBBB   . Stroke [facility](HCC)    Left sided weakness    Past Surgical History:  Procedure Laterality Date  . CATARACT EXTRACTION W/PHACO  09/09/2012   Procedure: CATARACT EXTRACTION PHACO AND INTRAOCULAR LENS PLACEMENT (IOC);  Surgeon: Chalmers Guestoy [name], MD;  [facility];  Service: Ophthalmology;  Laterality: Right;  . CATARACT EXTRACTION W/PHACO Left 03/31/2013   Procedure: CATARACT EXTRACTION PHACO AND INTRAOCULAR LENS PLACEMENT (IOC);  Surgeon: Chalmers Guestoy [name], MD;  [facility];  Service: Ophthalmology;  Laterality: Left;  . EYE SURGERY    . HIP ARTHROPLASTY Left 08/07/2020   Procedure: ARTHROPLASTY BIPOLAR HIP (HEMIARTHROPLASTY);  Surgeon: Jodi GeraldsGraves, John, MD;  Location: [facility] ORS;  Service: Orthopedics;  Laterality: Left;  . lipoma removal  1970's   Right shoulder  . US ECHOCARDIOGRAPHY  03/2013   mod LVH, EF 60%, normal wall motion    Allergies: Tape  Medications: Prior to Admission medications   Medication Sig Start Date End Date Taking? Authorizing Provider  acetaminophen (TYLENOL) 325 MG tablet Take 650 mg by mouth every 6 (six) hours as needed for mild pain or fever. 08/14/20  Yes [provider]  Amino Acids-Protein Hydrolys (FEEDING SUPPLEMENT, PRO-STAT 64,) LIQD Take 30 mLs by mouth in the morning and at bedtime. For wound healing 08/15/20  Yes [provider]  aspirin 325  MG tablet Take 325 mg by mouth daily.   Yes [provider]  atorvastatin (LIPITOR) 40 MG tablet Take 1 tablet (40 mg  total) by mouth daily. 08/14/20  Yes Rhetta Mura, MD  bisacodyl (DULCOLAX) 10 MG suppository Place 10 mg rectally as needed for moderate constipation.   Yes [provider]  brimonidine (ALPHAGAN P) 0.1 % SOLN Place 1 drop into both eyes 3 (three) times daily.   Yes [provider]  ferrous sulfate 325 (65 FE) MG tablet Take 325 mg by mouth daily.  08/31/18  Yes [name], MD  Insulin Aspart Prot & Aspart (NOVOLOG MIX 70/30 FLEXPEN ) Inject 14-18 Units into the skin See admin instructions. Inject 14 Units after breakfast,  Inject 18 units after lunch, inject 16 uints at dinner.   Yes [provider]  latanoprost (XALATAN) 0.005 % ophthalmic solution Place 1 drop into both eyes at bedtime.   Yes [provider]  magnesium hydroxide (MILK OF MAGNESIA) 400 MG/5ML suspension Take 30 mLs by mouth daily as needed for mild constipation.    Yes [provider]  metoprolol tartrate (LOPRESSOR) 25 MG tablet Take 1 tablet (25 mg total) by mouth 2 (two) times daily. 08/14/20  Yes Rhetta Mura, MD  MULTIPLE VITAMIN PO Take 1 tablet by mouth daily. W/ Minerals to promote wound healing   Yes [provider]  NON FORMULARY Medpass TID d/t weight loss. 09/13/20  Yes [provider]  potassium chloride (KLOR-CON) 10 MEQ tablet TAKE 3 TABLETS BY MOUTH DAILY Patient taking differently: 30 mEq daily.  09/11/20  Yes [name], MD  Sodium Phosphates (RA SALINE ENEMA RE) Place 1 enema rectally daily as needed (for constipation).   Yes [provider]  nitrofurantoin (MACRODANTIN) 100 MG capsule Take 100 mg by mouth 2 (two) times daily. For UTI 10/19/20 10/23/20  [provider]  sodium chloride 0.9 % infusion Inject 1,000 mLs into the vein daily. 289ml/ 1 hour    [provider]  sulfamethoxazole-trimethoprim (BACTRIM DS) 800-160 MG tablet Take 1 tablet by mouth 2 (two) times daily. For 10 days     [provider]  sulfamethoxazole-trimethoprim (BACTRIM) 400-80 MG tablet Take 1 tablet by mouth 2 (two) times daily. For 10 days    [provider]     Family History  Problem Relation Age of Onset  . Heart attack Father 30  . Hypertension Sister        Multiple medical problems  . Diabetes Sister   . Stroke Neg Hx   . Cancer Neg Hx     Social History   Socioeconomic History  . Marital status: Divorced    Spouse name: Not on file  . Number of children: 2  . Years of education: Not on file  . Highest education level: Not on file  Occupational History  . Occupation: Retired medically from [employer]  Tobacco Use  . Smoking status: Former Smoker    Packs/day: 1.00    Years: 30.00    Pack years: 30.00    Types: Cigarettes    Quit date: 09/07/1996    Years since quitting: 24.1  . Smokeless tobacco: Never Used  . Tobacco comment: quit after CVA  Vaping Use  . Vaping Use: Never used  Substance and Sexual Activity  . Alcohol use: No  . Drug use: No  . Sexual activity: Not Currently  Other Topics Concern  . Not on file  Social History Narrative  Caffeine: neg   Lives alone. Friend comes and helps sometimes. Son's brother in law helps with medicines. Family nearby - 2 sisters and nieces/nephews in area.   Sons live on other side of [place]       Ophtho - Dr. Harlon Flor at [facility] [employer]   Social Determinants of Health   Financial Resource Strain:   . Difficulty of Paying Living Expenses: Not on file  Food Insecurity:   . Worried About Programme researcher, broadcasting/film/video in the Last Year: Not on file  . Ran Out of Food in the Last Year: Not on file  Transportation Needs:   . Lack of Transportation (Medical): Not on file  . Lack of Transportation (Non-Medical): Not on file  Physical Activity:   . Days of Exercise per Week: Not on file  . Minutes of Exercise per Session: Not on file  Stress:   . Feeling of Stress : Not on file  Social  Connections:   . Frequency of Communication with Friends and Family: Not on file  . Frequency of Social Gatherings with Friends and Family: Not on file  . Attends Religious Services: Not on file  . Active Member of Clubs or Organizations: Not on file  . Attends Banker Meetings: Not on file  . Marital Status: Not on file      Review of Systems unable to obtain from patient secondary to mental status  Vital Signs: BP 112/60   Pulse (!) 26   Temp 99.6 F (37.6 C) (Rectal)   Resp (!) 35   Ht 6' (1.829 m)   Wt 154 lb 5.2 oz (70 kg)   SpO2 (!) 0%   BMI 20.93 kg/m   Physical Exam patient awake but confused and moaning, agitated; chest clear to auscultation bilaterally. Heart with tachycardia, ectopy noted; abdomen soft, positive bowel sounds, nontender. No lower extremity edema  Imaging: CT ABDOMEN PELVIS WO CONTRAST  Result Date: 11-19-2020 CLINICAL DATA:  Sepsis EXAM: CT ABDOMEN AND PELVIS WITHOUT CONTRAST TECHNIQUE: Multidetector CT imaging of the abdomen and pelvis was performed following the standard protocol without IV contrast. COMPARISON:  None. FINDINGS: Lower chest: Mild bibasilar atelectasis. Hepatobiliary: No focal liver abnormality is seen. Gallbladder is unremarkable. No bile duct dilatation. Pancreas: Partially infiltrated with fat but otherwise unremarkable. No peripancreatic fluid. Spleen: Unremarkable, although evaluation is limited by motion artifact. Adrenals/Urinary Tract: Adrenal glands are unremarkable. Kidneys are unremarkable without stone or hydronephrosis. No perinephric fluid. Bladder is decompressed by Foley catheter. Stomach/Bowel: No dilated large or small bowel loops. No convincing evidence of an acute bowel wall inflammation. Stomach is unremarkable, partially decompressed. Vascular/Lymphatic: Aortic atherosclerosis. No enlarged lymph nodes are seen in the abdomen or pelvis. Reproductive: Prostate is unremarkable. Other: No free fluid or abscess  collection is seen within the abdomen or pelvis. No free intraperitoneal air. Musculoskeletal: Degenerative spondylosis of the lumbar spine, moderate in degree. No acute appearing osseous abnormality. LEFT hip arthroplasty hardware in place. Prominent fluid-like collection lateral to the LEFT hip arthroplasty hardware, incompletely imaged, of uncertain age, containing small foci of air. IMPRESSION: 1. Prominent fluid-like collection lateral to the LEFT hip arthroplasty hardware, incompletely imaged, of uncertain age but small foci of air are seen within the collection which could indicate acute abscess. Alternatively, this could represent chronic seroma or hematoma. Consider further characterization with ultrasound. 2. No additional acute or significant findings within the abdomen or pelvis. No bowel obstruction or evidence of an acute bowel wall inflammation. No  free fluid or abscess collection is seen within the abdomen or pelvis. No evidence of acute solid organ abnormality. No renal or ureteral stone. Aortic Atherosclerosis (ICD10-I70.0). Electronically Signed   By: Bary Richard M.D.   On: 11/13/2020 04:51   DG Chest 1 View  Result Date: 10/22/2020 CLINICAL DATA:  Suspected sepsis. EXAM: CHEST  1 VIEW COMPARISON:  08/09/2020 FINDINGS: The heart size and mediastinal contours are within normal limits. Both lungs are clear. The visualized skeletal structures are unremarkable. IMPRESSION: No active disease. Electronically Signed   By: Katherine Mantle M.D.   On: 11/09/2020 02:12   [place] LT LOWER EXTREM LTD SOFT TISSUE NON VASCULAR  Result Date: 10/18/2020 CLINICAL DATA:  Possible abscess EXAM: ULTRASOUND LEFT LOWER EXTREMITY LIMITED TECHNIQUE: Ultrasound examination of the lower extremity soft tissues was performed in the area of clinical concern. COMPARISON:  None. FINDINGS: Scanning in the area of clinical concern adjacent to the left hip prosthesis demonstrates a complex fluid collection likely  representing a postoperative seroma/hematoma. Possibility of underlying infection could not be totally excluded on the basis of this exam. This corresponds to soft tissue density lateral to the proximal femur on recent CT examination. IMPRESSION: Complex fluid collection as described likely representing postoperative hematoma/seroma. This extends for at least 12 cm along the lateral aspect of the left thigh. Electronically Signed   By: Alcide Clever M.D.   On: 11/16/2020 08:26    Labs:  CBC: Recent Labs    08/13/20 0548 08/13/20 0548 08/21/20 0000 09/25/20 0000 10/24/2020 0100 10/30/2020 0404  WBC 10.7*  --  9.7 10.5 13.2*  --   HGB 11.6*   < > 11.8* 14.6 12.4* 9.5*  HCT 33.2*   < > 36* 43 37.1* 28.0*  PLT 230  --  435* 383 268  --    < > = values in this interval not displayed.    COAGS: Recent Labs    10/27/2020 0100  INR 1.4*  APTT 42*    BMP: Recent Labs    08/12/20 0540 08/12/20 0540 08/13/20 0548 08/13/20 0548 08/21/20 0000 08/21/20 0000 09/25/20 0000 11/10/2020 0100 11/10/2020 0100 11/10/2020 0400 10/20/2020 0404 10/18/2020 0722 11/11/2020 1010  NA 146*   < > 142   < > 138   < > 142 150*   < > 151* 151* 149* 146*  K 3.8   < > 3.9   < > 4.7   < > 4.3 6.0*  --  6.3* 5.9*  --  5.3*  CL 108   < > 110   < > 102   < > 100 117*  --  119*  --   --  116*  CO2 26   < > 22   < > 25*   < > 23* 20*  --  15*  --   --  17*  GLUCOSE 221*   < > 161*  --   --   --   --  131*  --  133*  --   --  241*  BUN 25*   < > 24*  --  10   < > 9 72*  --  74*  --   --  73*  CALCIUM 7.9*   < > 7.6*   < > 8.5*   < > 9.7 8.3*  --  8.2*  --   --  8.0*  CREATININE 1.34*   < > 1.04  --  0.8   < > 1.1 3.98*  --  3.83*  --   --  3.65*  GFRNONAA 50*   < > >60   < > 83.93   < > 62.96 15*  --  15*  --   --  16*  GFRAA 58*  --  >60  --  >90  --  72.97  --   --   --   --   --   --    < > = values in this interval not displayed.    LIVER FUNCTION TESTS: Recent Labs    08/10/20 0452 08/10/20 0452 08/11/20 0553  08/12/20 0540 08/21/20 0000 10/25/2020 0100  BILITOT 1.4*  --  1.2 1.1  --  1.6*  AST 89*   < > 129* 109* 23 123*  ALT 36   < > 52* 54* 21 58*  ALKPHOS 116  --  139* 146*  --  203*  PROT 6.5  --  6.3* 6.4*  --  8.1  ALBUMIN 2.3*  --  2.2* 2.1*  --  1.5*   < > = values in this interval not displayed.    TUMOR MARKERS: No results for input(s): AFPTM, CEA, CA199, CHROMGRNA in the last 8760 hours.  Assessment and Plan: 79 y.o. 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 Urine culture was positive for MSSA. He has been on antibiotics. In ED patient was febrile, tachycardic and with leukocytosis-WBC 13.2, along with hyponatremia, AKI and hyperkalemia. He is status post bipolar left hip hemiarthroplasty on 08/07/2020(hx left femoral neck fracture with nondisplaced left proximal humerus fracture and nondisplaced left radial head fracture);  patient is ill-appearing, moaning and essentially nonverbal.  CT of the abdomen pelvis revealed: .   Prominent fluid-like collection lateral to the LEFT hip arthroplasty hardware, incompletely imaged, of uncertain age but small foci of air are seen within the collection which could indicate acute abscess. Alternatively, this could represent chronic seroma or hematoma. Consider further characterization with ultrasound. 2. No additional acute or significant findings within the abdomen or pelvis. No bowel obstruction or evidence of an acute bowel wall inflammation. No free fluid or abscess collection is seen within the abdomen or pelvis. No evidence of acute solid organ abnormality. No renal or ureteral stone.  Aortic Atherosclerosis   Ultrasound  left hip/thigh region revealed: Complex fluid collection as described likely representing postoperative hematoma/seroma. This extends for at least 12 cm along the lateral aspect of the left thigh  Lactic acid 3.1, COVID 19 neg; blood/urine cultures pending; request now received from primary team for image guided aspiration possible drainage of the left hip fluid collection. Imaging studies were reviewed by Dr. Lowella Dandy and case discussed with primary care team/critical care medicine. Details/risk of procedure, including but not limited to, internal bleeding, infection, injury to adjacent structures, need for possible surgery discussed with patient's son,James Deahl, with his understanding and consent. Procedure scheduled for today.   Thank you for this interesting consult.  I greatly enjoyed meeting Huan Pollok. and look forward to participating in their care.  A copy of this report was sent to the requesting provider on this date.  Electronically Signed: D. Jeananne Rama, PA-C 2020-10-25, 12:14 PM   I spent a total of 25 minutes in face to face in clinical consultation, greater than  50% of which was counseling/coordinating care for image guided aspiration possible drainage of left hip fluid collection

## 2020-10-21 NOTE — Sedation Documentation (Signed)
Patient presented to CT room confused, agitated, moaning and groaning. He is unable to follow simple commands and directions. His procedure is beening completed with out sedation and patient appears to be tolerating it well at this time. His moaning and groaning has significantly reduced to silence at this time and he is alert.

## 2020-10-22 DIAGNOSIS — A419 Sepsis, unspecified organism: Secondary | ICD-10-CM | POA: Diagnosis not present

## 2020-10-22 DIAGNOSIS — R6521 Severe sepsis with septic shock: Secondary | ICD-10-CM | POA: Diagnosis not present

## 2020-10-22 LAB — CBC WITH DIFFERENTIAL/PLATELET
Abs Immature Granulocytes: 0.07 10*3/uL (ref 0.00–0.07)
Basophils Absolute: 0 10*3/uL (ref 0.0–0.1)
Basophils Relative: 0 %
Eosinophils Absolute: 0.4 10*3/uL (ref 0.0–0.5)
Eosinophils Relative: 4 %
HCT: 29.9 % — ABNORMAL LOW (ref 39.0–52.0)
Hemoglobin: 10.5 g/dL — ABNORMAL LOW (ref 13.0–17.0)
Immature Granulocytes: 1 %
Lymphocytes Relative: 11 %
Lymphs Abs: 1.1 10*3/uL (ref 0.7–4.0)
MCH: 26.3 pg (ref 26.0–34.0)
MCHC: 35.1 g/dL (ref 30.0–36.0)
MCV: 74.9 fL — ABNORMAL LOW (ref 80.0–100.0)
Monocytes Absolute: 0.3 10*3/uL (ref 0.1–1.0)
Monocytes Relative: 3 %
Neutro Abs: 8.1 10*3/uL — ABNORMAL HIGH (ref 1.7–7.7)
Neutrophils Relative %: 81 %
Platelets: 210 10*3/uL (ref 150–400)
RBC: 3.99 MIL/uL — ABNORMAL LOW (ref 4.22–5.81)
RDW: 17.9 % — ABNORMAL HIGH (ref 11.5–15.5)
WBC: 9.9 10*3/uL (ref 4.0–10.5)
nRBC: 0 % (ref 0.0–0.2)

## 2020-10-22 LAB — MAGNESIUM
Magnesium: 2.4 mg/dL (ref 1.7–2.4)
Magnesium: 2.4 mg/dL (ref 1.7–2.4)
Magnesium: 2.6 mg/dL — ABNORMAL HIGH (ref 1.7–2.4)

## 2020-10-22 LAB — BASIC METABOLIC PANEL
Anion gap: 12 (ref 5–15)
Anion gap: 11 (ref 5–15)
BUN: 77 mg/dL — ABNORMAL HIGH (ref 8–23)
BUN: 75 mg/dL — ABNORMAL HIGH (ref 8–23)
CO2: 18 mmol/L — ABNORMAL LOW (ref 22–32)
CO2: 20 mmol/L — ABNORMAL LOW (ref 22–32)
Calcium: 7.9 mg/dL — ABNORMAL LOW (ref 8.9–10.3)
Calcium: 8 mg/dL — ABNORMAL LOW (ref 8.9–10.3)
Chloride: 115 mmol/L — ABNORMAL HIGH (ref 98–111)
Chloride: 115 mmol/L — ABNORMAL HIGH (ref 98–111)
Creatinine, Ser: 3.39 mg/dL — ABNORMAL HIGH (ref 0.61–1.24)
Creatinine, Ser: 3.16 mg/dL — ABNORMAL HIGH (ref 0.61–1.24)
GFR, Estimated: 18 mL/min — ABNORMAL LOW (ref >60)
GFR, Estimated: 19 mL/min — ABNORMAL LOW (ref >60)
Glucose, Bld: 257 mg/dL — ABNORMAL HIGH (ref 70–99)
Glucose, Bld: 202 mg/dL — ABNORMAL HIGH (ref 70–99)
Potassium: 5.7 mmol/L — ABNORMAL HIGH (ref 3.5–5.1)
Potassium: 4.9 mmol/L (ref 3.5–5.1)
Sodium: 145 mmol/L (ref 135–145)
Sodium: 146 mmol/L — ABNORMAL HIGH (ref 135–145)

## 2020-10-22 LAB — HEPATIC FUNCTION PANEL
ALT: 39 U/L (ref 0–44)
AST: 70 U/L — ABNORMAL HIGH (ref 15–41)
Albumin: 1.2 g/dL — ABNORMAL LOW (ref 3.5–5.0)
Alkaline Phosphatase: 138 U/L — ABNORMAL HIGH (ref 38–126)
Bilirubin, Direct: 0.3 mg/dL — ABNORMAL HIGH (ref 0.0–0.2)
Indirect Bilirubin: 0.2 mg/dL — ABNORMAL LOW (ref 0.3–0.9)
Total Bilirubin: 0.5 mg/dL (ref 0.3–1.2)
Total Protein: 6.4 g/dL — ABNORMAL LOW (ref 6.5–8.1)

## 2020-10-22 LAB — GLUCOSE, CAPILLARY
Glucose-Capillary: 173 mg/dL — ABNORMAL HIGH (ref 70–99)
Glucose-Capillary: 198 mg/dL — ABNORMAL HIGH (ref 70–99)
Glucose-Capillary: 223 mg/dL — ABNORMAL HIGH (ref 70–99)
Glucose-Capillary: 266 mg/dL — ABNORMAL HIGH (ref 70–99)
Glucose-Capillary: 302 mg/dL — ABNORMAL HIGH (ref 70–99)
Glucose-Capillary: 310 mg/dL — ABNORMAL HIGH (ref 70–99)

## 2020-10-22 LAB — PHOSPHORUS
Phosphorus: 4.5 mg/dL (ref 2.5–4.6)
Phosphorus: 4.5 mg/dL (ref 2.5–4.6)
Phosphorus: 4.1 mg/dL (ref 2.5–4.6)

## 2020-10-22 LAB — HEMOGLOBIN AND HEMATOCRIT, BLOOD
HCT: 31.3 % — ABNORMAL LOW (ref 39.0–52.0)
Hemoglobin: 10.6 g/dL — ABNORMAL LOW (ref 13.0–17.0)

## 2020-10-22 LAB — C-REACTIVE PROTEIN: CRP: 27.6 mg/dL — ABNORMAL HIGH (ref <1.0)

## 2020-10-22 LAB — SEDIMENTATION RATE: Sed Rate: 105 mm/hr — ABNORMAL HIGH (ref 0–16)

## 2020-10-22 MED ORDER — SODIUM CHLORIDE 0.9 % IV SOLN
INTRAVENOUS | Status: DC | PRN
  Administered 2020-10-22: 45 mL via INTRAVENOUS
  Administered 2020-11-01: 250 mL via INTRAVENOUS

## 2020-10-22 MED ORDER — METOPROLOL TARTRATE 25 MG/10 ML ORAL SUSPENSION
12.5000 mg | Freq: Three times a day (TID) | ORAL | Status: DC
  Administered 2020-10-22 – 2020-10-24 (×7): 12.5 mg
  Filled 2020-10-22 (×7): qty 10

## 2020-10-22 MED ORDER — INSULIN GLARGINE 100 UNIT/ML ~~LOC~~ SOLN
5.0000 [IU] | Freq: Every day | SUBCUTANEOUS | Status: DC
  Administered 2020-10-22 – 2020-10-23 (×2): 5 [IU] via SUBCUTANEOUS
  Filled 2020-10-22 (×2): qty 0.05

## 2020-10-22 MED ORDER — POLYETHYLENE GLYCOL 3350 17 G PO PACK: 17.0000 g | PACK | Freq: Every day | ORAL | Status: DC | PRN

## 2020-10-22 MED ORDER — ACETAMINOPHEN 325 MG PO TABS
650.0000 mg | ORAL_TABLET | Freq: Four times a day (QID) | ORAL | Status: DC | PRN
  Administered 2020-10-25 – 2020-11-01 (×9): 650 mg
  Filled 2020-10-22 (×9): qty 2

## 2020-10-22 MED ORDER — DOCUSATE SODIUM 50 MG/5ML PO LIQD: 100.0000 mg | Freq: Two times a day (BID) | ORAL | Status: DC | PRN

## 2020-10-22 MED ORDER — PROSOURCE TF PO LIQD
45.0000 mL | Freq: Two times a day (BID) | ORAL | Status: DC
  Administered 2020-10-22 – 2020-10-23 (×3): 45 mL
  Filled 2020-10-22 (×3): qty 45

## 2020-10-22 MED ORDER — OXYCODONE HCL 5 MG/5ML PO SOLN
5.0000 mg | Freq: Four times a day (QID) | ORAL | Status: DC | PRN
  Administered 2020-10-23 – 2020-11-05 (×16): 5 mg
  Filled 2020-10-22 (×17): qty 5

## 2020-10-22 MED ORDER — ALBUMIN HUMAN 25 % IV SOLN
25.0000 g | Freq: Four times a day (QID) | INTRAVENOUS | Status: AC
  Administered 2020-10-22 (×3): 25 g via INTRAVENOUS
  Filled 2020-10-22 (×3): qty 100

## 2020-10-22 MED ORDER — FREE WATER
200.0000 mL | Status: DC
  Administered 2020-10-22 – 2020-10-23 (×6): 200 mL

## 2020-10-22 MED ORDER — INSULIN ASPART 100 UNIT/ML ~~LOC~~ SOLN
2.0000 [IU] | SUBCUTANEOUS | Status: DC
  Administered 2020-10-22 – 2020-10-23 (×7): 2 [IU] via SUBCUTANEOUS

## 2020-10-22 MED ORDER — HEPARIN SODIUM (PORCINE) 5000 UNIT/ML IJ SOLN
5000.0000 [IU] | Freq: Three times a day (TID) | INTRAMUSCULAR | Status: DC
  Administered 2020-10-22 – 2020-11-19 (×84): 5000 [IU] via SUBCUTANEOUS
  Filled 2020-10-22 (×83): qty 1

## 2020-10-22 MED ORDER — VITAL HIGH PROTEIN PO LIQD
1000.0000 mL | ORAL | Status: DC
  Administered 2020-10-22 – 2020-10-23 (×2): 1000 mL
  Filled 2020-10-22 (×2): qty 1000

## 2020-10-22 MED ORDER — METOPROLOL TARTRATE 5 MG/5ML IV SOLN
2.5000 mg | INTRAVENOUS | Status: AC | PRN
  Administered 2020-10-22 (×2): 2.5 mg via INTRAVENOUS
  Filled 2020-10-22: qty 5

## 2020-10-22 NOTE — Progress Notes (Signed)
eLink Physician-Brief Progress Note Patient Name: Marcus Downs. DOB: [birth year] MRN: [id number]   Date of Service  10/22/2020  HPI/Events of Note  Request for restraint renewal. On camera assessment patient remains at risk of pulling lines.  eICU Interventions  Restraint renewed for tonight. Bedside team to assess in am for need for continued restraint     Intervention Category Minor Interventions: Agitation / anxiety - evaluation and management  [name] 10/22/2020, 8:33 PM

## 2020-10-22 NOTE — Progress Notes (Signed)
Referring Physician(s): Choi,J/Icard,B  Supervising Physician: Richarda Overlie  Patient Status:  [facility] - In-pt  Chief Complaint:  Left hip/thigh abscess  Subjective: Pt remains lethargic, occ moaning, BP soft   Allergies: Tape  Medications: Prior to Admission medications   Medication Sig Start Date End Date Taking? Authorizing Provider  acetaminophen (TYLENOL) 325 MG tablet Take 650 mg by mouth every 6 (six) hours as needed for mild pain or fever. 08/14/20  Yes [provider]  Amino Acids-Protein Hydrolys (FEEDING SUPPLEMENT, PRO-STAT 64,) LIQD Take 30 mLs by mouth in the morning and at bedtime. For wound healing 08/15/20  Yes [provider]  aspirin 325 MG tablet Take 325 mg by mouth daily.   Yes [provider]  atorvastatin (LIPITOR) 40 MG tablet Take 1 tablet (40 mg total) by mouth daily. 08/14/20  Yes Rhetta Mura, MD  bisacodyl (DULCOLAX) 10 MG suppository Place 10 mg rectally as needed for moderate constipation.   Yes [provider]  brimonidine (ALPHAGAN P) 0.1 % SOLN Place 1 drop into both eyes 3 (three) times daily.   Yes [provider]  ferrous sulfate 325 (65 FE) MG tablet Take 325 mg by mouth daily.  08/31/18  Yes [name], MD  Insulin Aspart Prot & Aspart (NOVOLOG MIX 70/30 FLEXPEN [place]) Inject 14-18 Units into the skin See admin instructions. Inject 14 Units after breakfast,  Inject 18 units after lunch, inject 16 uints at dinner.   Yes [provider]  latanoprost (XALATAN) 0.005 % ophthalmic solution Place 1 drop into both eyes at bedtime.   Yes [provider]  magnesium hydroxide (MILK OF MAGNESIA) 400 MG/5ML suspension Take 30 mLs by mouth daily as needed for mild constipation.    Yes [provider]  metoprolol tartrate (LOPRESSOR) 25 MG tablet Take 1 tablet (25 mg total) by mouth 2 (two) times daily. 08/14/20  Yes Rhetta Mura, MD  MULTIPLE VITAMIN PO Take 1 tablet by  mouth daily. W/ Minerals to promote wound healing   Yes [provider]  NON FORMULARY Medpass TID d/t weight loss. 09/13/20  Yes [provider]  potassium chloride (KLOR-CON) 10 MEQ tablet TAKE 3 TABLETS BY MOUTH DAILY Patient taking differently: 30 mEq daily.  09/11/20  Yes [name], MD  Sodium Phosphates (RA SALINE ENEMA RE) Place 1 enema rectally daily as needed (for constipation).   Yes [provider]  nitrofurantoin (MACRODANTIN) 100 MG capsule Take 100 mg by mouth 2 (two) times daily. For UTI 10/19/20 10/23/20  [provider]  sodium chloride 0.9 % infusion Inject 1,000 mLs into the vein daily. 29ml/ 1 hour    [provider]  sulfamethoxazole-trimethoprim (BACTRIM DS) 800-160 MG tablet Take 1 tablet by mouth 2 (two) times daily. For 10 days    [provider]  sulfamethoxazole-trimethoprim (BACTRIM) 400-80 MG tablet Take 1 tablet by mouth 2 (two) times daily. For 10 days    [provider]     Vital Signs: BP 103/73   Pulse (!) 123   Temp (!) 97.5 F (36.4 C) (Axillary)   Resp (!) 27   Ht 6' (1.829 m)   Wt 151 lb 10.8 oz (68.8 kg)   SpO2 100%   BMI 20.57 kg/m   Physical Exam awake but lethargic; left hip/thigh drain intact, dressing clean and dry, OP 360 cc turbid/purulent , reddish brown fluid   Imaging: CT ABDOMEN PELVIS WO CONTRAST  Result Date: 10-31-2020 CLINICAL DATA:  Sepsis EXAM:  CT ABDOMEN AND PELVIS WITHOUT CONTRAST TECHNIQUE: Multidetector CT imaging of the abdomen and pelvis was performed following the standard protocol without IV contrast. COMPARISON:  None. FINDINGS: Lower chest: Mild bibasilar atelectasis. Hepatobiliary: No focal liver abnormality is seen. Gallbladder is unremarkable. No bile duct dilatation. Pancreas: Partially infiltrated with fat but otherwise unremarkable. No peripancreatic fluid. Spleen: Unremarkable, although evaluation is limited by motion artifact.  Adrenals/Urinary Tract: Adrenal glands are unremarkable. Kidneys are unremarkable without stone or hydronephrosis. No perinephric fluid. Bladder is decompressed by Foley catheter. Stomach/Bowel: No dilated large or small bowel loops. No convincing evidence of an acute bowel wall inflammation. Stomach is unremarkable, partially decompressed. Vascular/Lymphatic: Aortic atherosclerosis. No enlarged lymph nodes are seen in the abdomen or pelvis. Reproductive: Prostate is unremarkable. Other: No free fluid or abscess collection is seen within the abdomen or pelvis. No free intraperitoneal air. Musculoskeletal: Degenerative spondylosis of the lumbar spine, moderate in degree. No acute appearing osseous abnormality. LEFT hip arthroplasty hardware in place. Prominent fluid-like collection lateral to the LEFT hip arthroplasty hardware, incompletely imaged, of uncertain age, containing small foci of air. IMPRESSION: 1. Prominent fluid-like collection lateral to the LEFT hip arthroplasty hardware, incompletely imaged, of uncertain age but small foci of air are seen within the collection which could indicate acute abscess. Alternatively, this could represent chronic seroma or hematoma. Consider further characterization with ultrasound. 2. No additional acute or significant findings within the abdomen or pelvis. No bowel obstruction or evidence of an acute bowel wall inflammation. No free fluid or abscess collection is seen within the abdomen or pelvis. No evidence of acute solid organ abnormality. No renal or ureteral stone. Aortic Atherosclerosis (ICD10-I70.0). Electronically Signed   By: Bary Richard M.D.   On: Nov 06, 2020 04:51   DG Chest 1 View  Result Date: 11/06/20 CLINICAL DATA:  Suspected sepsis. EXAM: CHEST  1 VIEW COMPARISON:  08/09/2020 FINDINGS: The heart size and mediastinal contours are within normal limits. Both lungs are clear. The visualized skeletal structures are unremarkable. IMPRESSION: No active  disease. Electronically Signed   By: Katherine Mantle M.D.   On: 11-06-2020 02:12   DG Abd Portable 1V  Result Date: 2020-11-06 CLINICAL DATA:  NG tube placement. EXAM: PORTABLE ABDOMEN - 1 VIEW COMPARISON:  None. FINDINGS: NG tube tip is in the stomach. Proximal side port of the NG tube is below the GE junction. IMPRESSION: NG tube tip is in the stomach. Electronically Signed   By: Kennith Center M.D.   On: 11-06-2020 18:39   [place] LT LOWER EXTREM LTD SOFT TISSUE NON VASCULAR  Result Date: 2020/11/06 CLINICAL DATA:  Possible abscess EXAM: ULTRASOUND LEFT LOWER EXTREMITY LIMITED TECHNIQUE: Ultrasound examination of the lower extremity soft tissues was performed in the area of clinical concern. COMPARISON:  None. FINDINGS: Scanning in the area of clinical concern adjacent to the left hip prosthesis demonstrates a complex fluid collection likely representing a postoperative seroma/hematoma. Possibility of underlying infection could not be totally excluded on the basis of this exam. This corresponds to soft tissue density lateral to the proximal femur on recent CT examination. IMPRESSION: Complex fluid collection as described likely representing postoperative hematoma/seroma. This extends for at least 12 cm along the lateral aspect of the left thigh. Electronically Signed   By: Alcide Clever M.D.   On: November 06, 2020 08:26   CT IMAGE GUIDED DRAINAGE BY PERCUTANEOUS CATHETER  Result Date: 11-06-2020 INDICATION: 79 year old with sepsis and evidence for an abscess in the left upper thigh and hip region. History of  left hip arthroplasty. EXAM: CT-GUIDED DRAIN PLACEMENT IN LEFT THIGH ABSCESS MEDICATIONS: None ANESTHESIA/SEDATION: None COMPLICATIONS: None immediate. PROCEDURE: Informed consent was obtained from the patient's son after a thorough discussion of the procedural risks, benefits and alternatives. All questions were addressed. A timeout was performed prior to the initiation of the procedure. Patient was placed  on the CT scanner and images through the left upper thigh were obtained. Abscess in the lateral left upper thigh was identified with CT. The overlying skin was prepped with chlorhexidine and sterile field was created. Maximal barrier sterile technique was utilized including caps, mask, sterile gowns, sterile gloves, sterile drape, hand hygiene and skin antiseptic. Skin was anesthetized using 1% lidocaine. Using CT guidance, an 18 gauge trocar needle was directed into the abscess and pink colored purulent fluid was aspirated. Superstiff Amplatz wire was advanced into the abscess and tract was dilated to accommodate a 12 [place]French multipurpose drain. 180 mL of purulent fluid was removed. Follow up CT images were obtained. Catheter was sutured to skin and attached to a suction bulb. FINDINGS: Patient has a left hip arthroplasty. Extensive subcutaneous edema in the left upper thigh with air-fluid collection along the lateral aspect of the left hip and left upper thigh. In addition, there is low-density and enlargement of the left obturator externus muscle and the left gluteus medius muscle. There is concern for additional abscess collections in these areas. The large abscess along the left lateral thigh was decompressed following drain placement. IMPRESSION: CT-guided drainage of the abscess in the left lateral thigh and hip region. 180 mL of purulent fluid was removed. Fluid sample sent for culture. Concern for additional intramuscular abscesses around the left hip involving the left obturator externus and left gluteal musculature. Electronically Signed   By: Richarda OverlieAdam  Henn M.D.   On: 11/14/2020 14:07    Labs:  CBC: Recent Labs    08/21/20 0000 08/21/20 0000 09/25/20 0000 09/25/20 0000 11/04/2020 0100 11/09/2020 0404 10/22/20 0032 10/22/20 0512  WBC 9.7  --  10.5  --  13.2*  --   --  9.9  HGB 11.8*   < > 14.6   < > 12.4* 9.5* 10.6* 10.5*  HCT 36*   < > 43   < > 37.1* 28.0* 31.3* 29.9*  PLT 435*  --  383  --   268  --   --  210   < > = values in this interval not displayed.    COAGS: Recent Labs    10/19/2020 0100  INR 1.4*  APTT 42*    BMP: Recent Labs    08/12/20 0540 08/12/20 0540 08/13/20 0548 08/13/20 0548 08/21/20 0000 08/21/20 0000 09/25/20 0000 11/16/2020 0100 11/03/2020 1437 10/23/2020 2057 10/22/20 0032 10/22/20 0512  NA 146*   < > 142  --  138   < > 142   < > 145 146* 145 146*  K 3.8   < > 3.9   < > 4.7   < > 4.3   < > 6.0* 6.0* 5.7* 4.9  CL 108   < > 110   < > 102   < > 100   < > 115* 115* 115* 115*  CO2 26   < > 22   < > 25*   < > 23*   < > 15* 18* 18* 20*  GLUCOSE 221*   < > 161*  --   --   --   --    < > 280* 305* 257* 202*  BUN 25*   < > 24*  --  10   < > 9   < > 75* 74* 77* 75*  CALCIUM 7.9*   < > 7.6*   < > 8.5*   < > 9.7   < > 8.2* 8.0* 7.9* 8.0*  CREATININE 1.34*   < > 1.04  --  0.8   < > 1.1   < > 3.65* 3.28* 3.39* 3.16*  GFRNONAA 50*   < > >60   < > 83.93   < > 62.96   < > 16* 19* 18* 19*  GFRAA 58*  --  >60  --  >90  --  72.97  --   --   --   --   --    < > = values in this interval not displayed.    LIVER FUNCTION TESTS: Recent Labs    08/11/20 0553 08/11/20 0553 08/12/20 0540 08/21/20 0000 11/16/2020 0100 10/22/20 0512  BILITOT 1.2  --  1.1  --  1.6* 0.5  AST 129*   < > 109* 23 123* 70*  ALT 52*   < > 54* 21 58* 39  ALKPHOS 139*  --  146*  --  203* 138*  PROT 6.3*  --  6.4*  --  8.1 6.4*  ALBUMIN 2.2*  --  2.1*  --  1.5* 1.2*   < > = values in this interval not displayed.    Assessment and Plan: Pt with hx sepsis/left upper thigh/hip abscess; s/p drain placement 12/4; afebrile;  WBC nl; hgb stable; creat 3.16; drain fluid cx- staph aureus; cont drain flushes/OP monitoring; f/u imaging once OP minimal or if clinical status worsens  Electronically Signed: D. Jeananne Rama, PA-C 10/22/2020, 1:03 PM   I spent a total of 15 minutes at the the patient's bedside AND on the patient's hospital floor or unit, greater than 50% of which was  counseling/coordinating care for left hip/thigh abscess drain    Patient ID: Marcus Lank., male   DOB: Mar 22, 1941, 79 y.o.   MRN: [id number]

## 2020-10-22 NOTE — Progress Notes (Signed)
PT Cancellation Note  Patient Details Name: Marcus Downs. MRN: [id number] DOB: May 24, 1941   Cancelled Treatment:    Reason Eval/Treat Not Completed: Patient not medically ready (pt with HR at rest130 with infected hip and will hold)   Maynard David B Estephany Perot 10/22/2020, 8:02 AM  [name], PT Acute Rehabilitation Services Pager: [phone] Office: [phone]

## 2020-10-22 NOTE — Progress Notes (Signed)
Subjective:     Activity level:  Bedrest in ICU Diet tolerance:  NG Voiding:  foley Patient reports pain as 0 on 0-10 scale and severe.  Patient suffers from dementia  Objective: Vital signs in last 24 hours: Temp:  [97.7 F (36.5 C)-99.8 F (37.7 C)] 97.7 F (36.5 C) (12/05 0900) Pulse Rate:  [26-167] 123 (12/05 0923) Resp:  [15-39] 27 (12/05 0700) BP: (72-123)/(49-109) 103/73 (12/05 0923) SpO2:  [0 %-100 %] 100 % (12/05 0700) Weight:  [68.8 kg] 68.8 kg (12/05 0530)  Labs: Recent Labs    11/14/2020 0100 Nov 14, 2020 0404 10/22/20 0032 10/22/20 0512  HGB 12.4* 9.5* 10.6* 10.5*   Recent Labs    2020/11/14 0100 11/14/20 0404 10/22/20 0032 10/22/20 0512  WBC 13.2*  --   --  9.9  RBC 4.75  --   --  3.99*  HCT 37.1*   < > 31.3* 29.9*  PLT 268  --   --  210   < > = values in this interval not displayed.   Recent Labs    10/22/20 0032 10/22/20 0512  NA 145 146*  K 5.7* 4.9  CL 115* 115*  CO2 18* 20*  BUN 77* 75*  CREATININE 3.39* 3.16*  GLUCOSE 257* 202*  CALCIUM 7.9* 8.0*   Recent Labs    11/14/2020 0100  INR 1.4*    Physical Exam:  ABD soft Intact pulses distally Compartment soft  Assessment/Plan:    WBC count down today and remains afebrile. BP stable. Reviewed studies with radiology.  Difficult to determine if infection involves hip joint itself but likely does.  Drain continues with some output so will leave for now.  Followup CT in a few days.  There is another suspicious area that could also be drained if he doesn't continue to trend well.  I still don't think he would survive surgical removal of implants so plan to suppress with long term antibiotic coverage so long as he remains stable.  Appreciate ID management.      Velna Ochs 10/22/2020, 10:31 AM

## 2020-10-22 NOTE — Progress Notes (Addendum)
NAME:  Marcus Downs., MRN:  [id number], DOB:  September 29, 1941, LOS: 1 ADMISSION DATE:  11/12/20, CONSULTATION DATE: 12/4 REFERRING MD: Dr. 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 Luiz Blare 07/2020. He was sent to the ED from his nursing home for worsening altered mental status, agitation, and findings of AKI with electrolyte abnormalities.  History of present illness   Patient transferred from [facility] for worsening systemic symptoms thought to be secondary to UTI. He was treated for a UTI 09/28/20 and completed 7 day course of cipro. 11/29 the patient was found to be altered with a fever, increased fatigue,decreased functionality, and yellow urine with sediment present. At that time, urine cultures were positive for staph aureus. Other pertinent lab findings of AKI with cr of 1.26, Na of 153. WBC of 10.4. Hgb of 11.9, MCV of 73.8. Nitrofurantoin was started for 5 days and switched to bactrim the following day. Foley catheter was changed 4 days ago. His mental status continued to worsen, patient became non-verbal so he was sent to the ED early this am.  Conversation with staff at [facility] reveal baseline mentation of waxing and waning orientation due to dementia. Mentions often confused but easily redirectable. Able to recognize familiar faces but unable to express needs appropriately. Family also mentions that Marcus Downs's mentation has been in steady decline since his stroke and he has become increasingly confused during the last month.  ED Course: Patient found to be tachycardic, hypotensive. Lab work revealing BUN of 72 and Cr of 3.98, Na of 150, K of 6.1. He was admitted to hospitalist service for severe sepsis  secondary to MSSA CAUTI. CT A/P revealed prominent fluid collection of the left hip, lateral to arthroplasty hardware. [place] was performed of the area, revealed complex fluid collection. Ortho/IR were consulted as area was suspicious for left hip abscess.   PCCM was consulted due to patient's worsening sepsis with possible need for pressors.   Past Medical History   Past Medical History:  Diagnosis Date  . CRI (chronic renal insufficiency)   . 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 Diabetes mellitus type II 1992  . HLD (hyperlipidemia) 10/1998  . HTN (hypertension) 10/1998  . Primary open angle glaucoma    [name]  . RBBB   . Stroke [facility])    Left sided weakness   Significant Hospital Events   12/4 Admitted  Consults:  PCCM  Procedures:  12/4 Left Hip Fluid Collection Drain  Significant Diagnostic Tests:  CXR 12/4 - No active disease CT A/P w/o C 12/4 - prominent fluid like collection lateral to left hip hardware, indicative of acute abscess, but unable to rule out chronic seroma/hematoma LE [place] 12/4 - complex fluid collection left hip, postoperative hematoma/seroma. 12 cm along lateral aspect of the left thigh  Micro Data:  Prior to admission: [facility] Note, [facility] 12/02 + for staph aureus BC x 2 12/4 - Pending UC 12/4 - Pending  Antimicrobials:  12/04 - Cefepime/Linezolid. Both DC'd changed to ceftriaxone by ID  Interim history/subjective:   Patient had rounds of SVT last night given as needed IV pushes of metoprolol.  Tube feeds were not started after NG  tube placement.   Objective   Blood pressure 98/65, pulse (!) 132, temperature 99.8 F (37.7 C), temperature source Axillary, resp. rate (!) 27, height 6' (1.829 m), weight 68.8 kg, SpO2 100 %.        Intake/Output Summary (Last 24 hours) at 10/22/2020 0711 Last data filed at 10/22/2020 0700 Gross per 24 hour  Intake 2726.18 ml  Output 1260 ml  Net 1466.18 ml   Filed Weights    11/05/2020 0139 10/22/20 0530  Weight: 70 kg 68.8 kg    Examination: General: Elderly male in icu not intubated, confused, tachycardic HENT: NCAT Lungs: Clear to auscultation bilaterally no crackles no wheeze Cardiovascular: Tachycardic, S1-S2, regular Abdomen: Soft, nontender nondistended Extremities: Dry, no edema Neuro: Left-sided hemiparesis GU: Foley in place  Assessment & Plan:   Septic Shock, shock state resolved pressures stable after fluid resuscitation Suspected Left Hip Abscess S/P left hip arthroplasty 07/2020 Catheter Associated UTI MSSA bacteremia -Antibiotics deescalated to Ancef -IR drainage of left hip. - repeat blood cultures  - echo to look for veg   SVT Plan: As needed IV metoprolol for rates greater than 160. Scheduled metoprolol 12.5 mg every 8 hours down NG tube.  Acute Metabolic Encephalopathy History of CVA Plan: Related to sepsis and pain. Continue to observe, baseline dementia vascular dementia and previous stroke  AKI Prerenal Secondary to Sepsis Hypernatremia Hyperkalemia Plan Start tube feeds with free water to help correct sodium Continue rehydration with gut if possible Stop IV fluids Continue to follow urine output and trend serum creatinine.  Microcytic Anemia History of iron deficiency anemia. Baseline Hgb of 12.3, appears to be at baseline. Will not give iron in context of active infection.  Plan: Follow CBC No sign of active bleeding.  Elevated Liver Enzymes Appears to be secondary to septic shock. Plan: Continue to follow  T2DM  Last A1c of 8.1 Plan: SSI.  Hx of Dysphagia NG tube Plan: Start tube feeds  Best practice:   Diet: Tube feeds Pain/Anxiety/Delirium protocol (if indicated): tylenol 650 mg oral/rectal mild pain/fever, dilaudid .5 mg q3h PRN severe pain. VAP protocol (if indicated): None DVT prophylaxis: SCD GI prophylaxis: Protonix Glucose control: SSI Mobility: Bed Rest Code Status: Full Family  Communication: Patient's son was updated yesterday, will reach out again today. Disposition: MICU  Labs   CBC: Recent Labs  Lab 10/30/2020 0100 10/31/2020 0404 10/22/20 0032 10/22/20 0512  WBC 13.2*  --   --  9.9  NEUTROABS 11.7*  --   --  8.1*  HGB 12.4* 9.5* 10.6* 10.5*  HCT 37.1* 28.0* 31.3* 29.9*  MCV 78.1*  --   --  74.9*  PLT 268  --   --  210    Basic Metabolic Panel: Recent Labs  Lab 11/05/2020 1010 10/19/2020 1437 11/09/2020 2057 10/22/20 0032 10/22/20 0512  NA 146* 145 146* 145 146*  K 5.3* 6.0* 6.0* 5.7* 4.9  CL 116* 115* 115* 115* 115*  CO2 17* 15* 18* 18* 20*  GLUCOSE 241* 280* 305* 257* 202*  BUN 73* 75* 74* 77* 75*  CREATININE 3.65* 3.65* 3.28* 3.39* 3.16*  CALCIUM 8.0* 8.2* 8.0* 7.9* 8.0*  MG  --   --   --   --  2.4  PHOS  --   --   --   --  4.5   GFR: Estimated Creatinine Clearance: 18.7 mL/min (A) (by C-G formula based on SCr of 3.16 mg/dL (H)). Recent Labs  Lab 10/28/2020 0100 10/25/2020 0102 10/18/2020 0722 11/01/2020 1010  10/18/2020 1437 10/25/2020 2057 10/22/20 0512  PROCALCITON  --   --  11.07  --   --   --   --   WBC 13.2*  --   --   --   --   --  9.9  LATICACIDVEN  --    < > 2.6* 3.1* 4.4* 2.6*  --    < > = values in this interval not displayed.    Liver Function Tests: Recent Labs  Lab 10/31/2020 0100 10/22/20 0512  AST 123* 70*  ALT 58* 39  ALKPHOS 203* 138*  BILITOT 1.6* 0.5  PROT 8.1 6.4*  ALBUMIN 1.5* 1.2*   No results for input(s): LIPASE, AMYLASE in the last 168 hours. Recent Labs  Lab 11/15/2020 0722  AMMONIA <9*    ABG    Component Value Date/Time   HCO3 15.5 (L) 10/31/2020 0404   TCO2 16 (L) 10/30/2020 0404   ACIDBASEDEF 7.0 (H) 11/12/2020 0404   O2SAT 79.0 11/13/2020 0404     Coagulation Profile: Recent Labs  Lab 11/02/2020 0100  INR 1.4*    Cardiac Enzymes: No results for input(s): CKTOTAL, CKMB, CKMBINDEX, TROPONINI in the last 168 hours.  HbA1C: Hgb A1c MFr Bld  Date/Time Value Ref Range Status  11/11/2020  01:00 AM 8.1 (H) 4.8 - 5.6 % Final    Comment:    (NOTE) Pre diabetes:          5.7%-6.4%  Diabetes:              >6.4%  Glycemic control for   <7.0% adults with diabetes   08/06/2020 06:01 PM 7.7 (H) 4.8 - 5.6 % Final    Comment:    (NOTE) Pre diabetes:          5.7%-6.4%  Diabetes:              >6.4%  Glycemic control for   <7.0% adults with diabetes     CBG: Recent Labs  Lab 10/27/2020 1418 10/30/2020 1555 11/09/2020 2115 11/09/2020 2336 10/22/20 0340  GLUCAP 203* 183* 228* 252* 173*     This patient is critically ill with multiple organ system failure; which, requires frequent high complexity decision making, assessment, support, evaluation, and titration of therapies. This was completed through the application of advanced monitoring technologies and extensive interpretation of multiple databases. During this encounter critical care time was devoted to patient care services described in this note for 32 minutes.  Josephine Igo, DO [facility] 10/22/2020 7:11 AM

## 2020-10-22 NOTE — Progress Notes (Signed)
Brief Nutrition Note RD working remotely.   Consult received for enteral/tube feeding initiation and management. OGT placed yesterday evening and abdominal xray report states tip of tube is gastric.  Adult Enteral Nutrition Protocol initiated. Full assessment to follow.  Admitting Dx: Dehydration [E86.0] Hyperkalemia [E87.5] Abscess [L02.91] Hypernatremia [E87.0] Elevated liver function tests [R79.89] Acute kidney injury (nontraumatic) (HCC) [N17.9] Abscess of left hip [L02.416] Sepsis (HCC) [A41.9] Severe sepsis (HCC) [A41.9, R65.20] Sepsis due to undetermined organism (HCC) [A41.9]  Body mass index is 20.57 kg/m. Pt meets criteria for normal weight based on current BMI.  Labs:  Recent Labs  Lab 10/30/2020 2057 10/22/20 0032 10/22/20 0512 10/22/20 0933  NA 146* 145 146*  --   K 6.0* 5.7* 4.9  --   CL 115* 115* 115*  --   CO2 18* 18* 20*  --   BUN 74* 77* 75*  --   CREATININE 3.28* 3.39* 3.16*  --   CALCIUM 8.0* 7.9* 8.0*  --   MG  --   --  2.4 2.4  PHOS  --   --  4.5 4.5  GLUCOSE 305* 257* 202*  --         Trenton Gammon, MS, RD, LDN, CNSC Inpatient Clinical Dietitian RD pager # available in [facility]  After hours/weekend pager # available in [facility]

## 2020-10-22 NOTE — Progress Notes (Signed)
[facility] Infectious Diseases Follow Up Note  Patient Identification: Patient Name: Marcus Downs. MRN: [id number] Admit Date: 11-02-2020 12:39 AM Age: 79 y.o.Today's Date: 10/22/2020   Reason for Visit: MSSA bacteremia  Active Problems:   Severe sepsis (HCC)  Antibiotics: Total days of antibiotics day 3                    Cefazolin day 1  Assessment 1. High Grade MSSA Bacteremia   2. Complex Fluid collection at the site of left Hip arthroplasty done in 08/07/20. S/p Left hip aspiration by IR on 12/4. Cultures GPC in pairs and clusters, no growth to date   3. Positive Staph aureus isolated in outside urine cultures - Patient has chronic foley's for urinary retention. At SNF, he was noted to be febrile, urine culture positive for MSSA. He was started on Macrobid initially on 12/2, switched to Bactrim on 12/3.  Foley catheter was last exchanged 11/28. No sensitivities available at this time.   4. AKI- in the setting of sepsis/dehydration, per primary  Recommendations  -Can de-escalate antibiotics to cefazolin ( renal dosing) -Repeat 2 sets of blood cultures tomorrow am for clearance -TTE -Follow up cultures from left hip aspiration  -Follow Orthopedics recommendations- no plan for surgical intervention currently given patient is a high risk surgical candidate -Will need to be treated as a PJI given proximity of fluid collection with hardware for prolonged duration -Anticipate to add Rifampin after clearance of blood cultures if MSSA is susceptible to Rifampin.  -Monitor CBC, BMP on abx  ID team will follow Monday   Rest of the management as per the primary team. Thank you for the consult. Please page with pertinent questions or concerns.  Odette Fraction, MD Infectious Diseases  [facility]   To contact the attending provider between 8A-5P or the covering provider during after hours  5P-8A, please log into the web site [email or web address] and access using universal [place] password for that web site. If you do not have the password, please call the hospital operator. ______________________________________________________________________ Subjective patient seen and examined at the bedside. He is lying in bed and continues to moan   Vitals BP 103/73   Pulse (!) 123   Temp 97.7 F (36.5 C) (Axillary)   Resp (!) 27   Ht 6' (1.829 m)   Wt 68.8 kg   SpO2 100%   BMI 20.57 kg/m     Physical Exam Constitutional:  sick looking elderly male lying in bed, looks lethargic    Comments: moaning  Cardiovascular:     Rate and Rhythm: Normal rate and regular rhythm.     Heart sounds: tachycardic,   Pulmonary:     Effort: Pulmonary effort is normal.     Comments:   Abdominal:     Palpations: Abdomen is soft.     Tenderness: Non tender   Musculoskeletal:        General: Left thigh is bandaged and has a drain with sanguinous fluid  Skin:    Comments: no obvious lesions/rashes   Neurological:     General: Left sided weakness   Psychiatric:   moaning    Pertinent Microbiology Results for orders placed or performed during the hospital encounter of Nov 02, 2020  Culture, blood (Routine x 2)     Status: Abnormal (Preliminary result)   Collection Time: Nov 02, 2020  1:00 AM   Specimen: BLOOD  Result Value Ref Range Status   Specimen Description BLOOD SITE  NOT SPECIFIED  Final   Special Requests   Final    BOTTLES DRAWN AEROBIC AND ANAEROBIC Blood Culture adequate volume   Culture  Setup Time   Final    GRAM POSITIVE COCCI IN CLUSTERS IN BOTH AEROBIC AND ANAEROBIC BOTTLES CRITICAL VALUE NOTED.  VALUE IS CONSISTENT WITH PREVIOUSLY REPORTED AND CALLED VALUE. Performed at [facility], [address] [place], [place] [address]    Culture STAPHYLOCOCCUS AUREUS (A)  Final   Report Status PENDING  Incomplete  Culture, blood (Routine x 2)     Status: Abnormal  (Preliminary result)   Collection Time: 11/10/2020  1:00 AM   Specimen: BLOOD  Result Value Ref Range Status   Specimen Description BLOOD SITE NOT SPECIFIED  Final   Special Requests   Final    BOTTLES DRAWN AEROBIC AND ANAEROBIC Blood Culture results may not be optimal due to an inadequate volume of blood received in culture bottles   Culture  Setup Time   Final    GRAM POSITIVE COCCI IN CLUSTERS IN BOTH AEROBIC AND ANAEROBIC BOTTLES CRITICAL RESULT CALLED TO, READ BACK BY AND VERIFIED WITH: E MARTEN,PHARMD AT 1717 11/09/2020 BY L BENFIELD    Culture (A)  Final    STAPHYLOCOCCUS AUREUS SUSCEPTIBILITIES TO FOLLOW Performed at [facility], [address] [place], [place] [address]    Report Status PENDING  Incomplete  Blood Culture ID Panel (Reflexed)     Status: Abnormal   Collection Time: 11/14/2020  1:00 AM  Result Value Ref Range Status   Enterococcus faecalis NOT DETECTED NOT DETECTED Final   Enterococcus Faecium NOT DETECTED NOT DETECTED Final   Listeria monocytogenes NOT DETECTED NOT DETECTED Final   Staphylococcus species DETECTED (A) NOT DETECTED Final    Comment: CRITICAL RESULT CALLED TO, READ BACK BY AND VERIFIED WITH: E MARTEN,PHARMD AT 1717 10/20/2020 BY L BENFIELD    Staphylococcus aureus (BCID) DETECTED (A) NOT DETECTED Final    Comment: CRITICAL RESULT CALLED TO, READ BACK BY AND VERIFIED WITH: E MARTEN,PHARMD AT 1717 10/23/2020 BY L BENFIELD    Staphylococcus epidermidis NOT DETECTED NOT DETECTED Final   Staphylococcus lugdunensis NOT DETECTED NOT DETECTED Final   Streptococcus species NOT DETECTED NOT DETECTED Final   Streptococcus agalactiae NOT DETECTED NOT DETECTED Final   Streptococcus pneumoniae NOT DETECTED NOT DETECTED Final   Streptococcus pyogenes NOT DETECTED NOT DETECTED Final   A.calcoaceticus-baumannii NOT DETECTED NOT DETECTED Final   Bacteroides fragilis NOT DETECTED NOT DETECTED Final   Enterobacterales NOT DETECTED NOT DETECTED Final    Enterobacter cloacae complex NOT DETECTED NOT DETECTED Final   Escherichia coli NOT DETECTED NOT DETECTED Final   Klebsiella aerogenes NOT DETECTED NOT DETECTED Final   Klebsiella oxytoca NOT DETECTED NOT DETECTED Final   Klebsiella pneumoniae NOT DETECTED NOT DETECTED Final   Proteus species NOT DETECTED NOT DETECTED Final   Salmonella species NOT DETECTED NOT DETECTED Final   Serratia marcescens NOT DETECTED NOT DETECTED Final   Haemophilus influenzae NOT DETECTED NOT DETECTED Final   Neisseria meningitidis NOT DETECTED NOT DETECTED Final   Pseudomonas aeruginosa NOT DETECTED NOT DETECTED Final   Stenotrophomonas maltophilia NOT DETECTED NOT DETECTED Final   Candida albicans NOT DETECTED NOT DETECTED Final   Candida auris NOT DETECTED NOT DETECTED Final   Candida glabrata NOT DETECTED NOT DETECTED Final   Candida krusei NOT DETECTED NOT DETECTED Final   Candida parapsilosis NOT DETECTED NOT DETECTED Final   Candida tropicalis NOT DETECTED NOT  DETECTED Final   Cryptococcus neoformans/gattii NOT DETECTED NOT DETECTED Final   Meth resistant mecA/C and MREJ NOT DETECTED NOT DETECTED Final    Comment: Performed at [facility], [address] [place], [place] [address]  Resp Panel by RT-PCR (Flu A&B, Covid) Nasopharyngeal Swab     Status: None   Collection Time: 10-28-20  2:46 AM   Specimen: Nasopharyngeal Swab; Nasopharyngeal(NP) swabs in vial transport medium  Result Value Ref Range Status   SARS Coronavirus 2 by RT PCR NEGATIVE NEGATIVE Final    Comment: (NOTE) SARS-CoV-2 target nucleic acids are NOT DETECTED.  The SARS-CoV-2 RNA is generally detectable in upper respiratory specimens during the acute phase of infection. The lowest concentration of SARS-CoV-2 viral copies this assay can detect is 138 copies/mL. A negative result does not preclude SARS-Cov-2 infection and should not be used as the sole basis for treatment or other patient management decisions. A negative result  may occur with  improper specimen collection/handling, submission of specimen other than nasopharyngeal swab, presence of viral mutation(s) within the areas targeted by this assay, and inadequate number of viral copies(<138 copies/mL). A negative result must be combined with clinical observations, patient history, and epidemiological information. The expected result is Negative.  Fact Sheet for Patients:  BloggerCourse.com  Fact Sheet for Healthcare Providers:  SeriousBroker.it  This test is no t yet approved or cleared by the [place] FDA and  has been authorized for detection and/or diagnosis of SARS-CoV-2 by FDA under an Emergency Use Authorization (EUA). This EUA will remain  in effect (meaning this test can be used) for the duration of the COVID-19 declaration under Section 564(b)(1) of the Act, 21 U.S.C.section 360bbb-3(b)(1), unless the authorization is terminated  or revoked sooner.       Influenza A by PCR NEGATIVE NEGATIVE Final   Influenza B by PCR NEGATIVE NEGATIVE Final    Comment: (NOTE) The Xpert Xpress SARS-CoV-2/FLU/RSV plus assay is intended as an aid in the diagnosis of influenza from Nasopharyngeal swab specimens and should not be used as a sole basis for treatment. Nasal washings and aspirates are unacceptable for Xpert Xpress SARS-CoV-2/FLU/RSV testing.  Fact Sheet for Patients: BloggerCourse.com  Fact Sheet for Healthcare Providers: SeriousBroker.it  This test is not yet approved or cleared by the [place] FDA and has been authorized for detection and/or diagnosis of SARS-CoV-2 by FDA under an Emergency Use Authorization (EUA). This EUA will remain in effect (meaning this test can be used) for the duration of the COVID-19 declaration under Section 564(b)(1) of the Act, 21 U.S.C. section 360bbb-3(b)(1), unless the authorization is terminated  or revoked.  Performed at [facility], [address] [place], [place] [address]   Urine culture     Status: Abnormal (Preliminary result)   Collection Time: 10-28-20  2:48 AM   Specimen: In/Out Cath Urine  Result Value Ref Range Status   Specimen Description IN/OUT CATH URINE  Final   Special Requests   Final    NONE Performed at [facility], [address], [place], [place] [address]    Culture >=100,000 COLONIES/mL STAPHYLOCOCCUS AUREUS (A)  Final   Report Status PENDING  Incomplete  Aerobic/Anaerobic Culture (surgical/deep wound)     Status: None (Preliminary result)   Collection Time: 2020-10-28  1:47 PM   Specimen: Abscess  Result Value Ref Range Status   Specimen Description ABSCESS  Final   Special Requests LEFT THIGH/HIP ABSC  Final   Gram Stain  Final    FEW WBC PRESENT, PREDOMINANTLY PMN MODERATE GRAM POSITIVE COCCI IN PAIRS IN CLUSTERS Performed at [facility], [address], [place], [place] [address]    Culture PENDING  Incomplete   Report Status PENDING  Incomplete  MRSA PCR Screening     Status: None   Collection Time: 10/29/20  3:12 PM   Specimen: Nasopharyngeal  Result Value Ref Range Status   MRSA by PCR NEGATIVE NEGATIVE Final    Comment:        The GeneXpert MRSA Assay (FDA approved for NASAL specimens only), is one component of a comprehensive MRSA colonization surveillance program. It is not intended to diagnose MRSA infection nor to guide or monitor treatment for MRSA infections. Performed at [facility], [address] [place], [place] [address]       Pertinent Lab. CBC Latest Ref Rng & Units 10/22/2020 10/22/2020 29-Oct-2020  WBC 4.0 - 10.5 K/uL 9.9 - -  Hemoglobin 13.0 - 17.0 g/dL 10.5(L) 10.6(L) 9.5(L)  Hematocrit 39 - 52 % 29.9(L) 31.3(L) 28.0(L)  Platelets 150 - 400 K/uL 210 - -   CMP Latest Ref Rng & Units 10/22/2020 10/22/2020 29-Oct-2020  Glucose 70 - 99 mg/dL 563(J) 497(W) 263(Z)  BUN 8 - 23 mg/dL  85(Y) 85(O) 27(X)  Creatinine 0.61 - 1.24 mg/dL 4.12(I) 7.86(V) 6.72(C)  Sodium 135 - 145 mmol/L 146(H) 145 146(H)  Potassium 3.5 - 5.1 mmol/L 4.9 5.7(H) 6.0(H)  Chloride 98 - 111 mmol/L 115(H) 115(H) 115(H)  CO2 22 - 32 mmol/L 20(L) 18(L) 18(L)  Calcium 8.9 - 10.3 mg/dL 8.0(L) 7.9(L) 8.0(L)  Total Protein 6.5 - 8.1 g/dL 6.4(L) - -  Total Bilirubin 0.3 - 1.2 mg/dL 0.5 - -  Alkaline Phos 38 - 126 U/L 138(H) - -  AST 15 - 41 U/L 70(H) - -  ALT 0 - 44 U/L 39 - -     Pertinent Imaging today Plain films and CT images have been personally visualized and interpreted; radiology reports have been reviewed. Decision making incorporated into the Impression / Recommendations.  I have spent approx 30 minutes for this patient encounter including review of prior medical records with greater than 50% of time being face to face and coordination of their care.

## 2020-10-22 NOTE — Progress Notes (Signed)
SLP Cancellation Note  Patient Details Name: Marcus Downs. MRN: [id number] DOB: 1941/11/12   Cancelled treatment:       Reason Eval/Treat Not Completed: Patient not medically ready;Fatigue/lethargy limiting ability to participate  Per discussion with RN, pt continues with decreased alertness and participation; inappropriate for PO intake at this time. ST service to follow closely as mentation improves.   Lillyn Wieczorek P. Pati Thinnes, M.S., CCC-SLP Speech-Language Pathologist Acute Rehabilitation Services Pager: [phone]  Susanne Borders Arcadio Cope 10/22/2020, 8:50 AM

## 2020-10-23 ENCOUNTER — Inpatient Hospital Stay (HOSPITAL_COMMUNITY): Payer: Medicare Other

## 2020-10-23 DIAGNOSIS — A4101 Sepsis due to Methicillin susceptible Staphylococcus aureus: Secondary | ICD-10-CM | POA: Diagnosis not present

## 2020-10-23 DIAGNOSIS — R7881 Bacteremia: Secondary | ICD-10-CM

## 2020-10-23 DIAGNOSIS — R6520 Severe sepsis without septic shock: Secondary | ICD-10-CM | POA: Diagnosis not present

## 2020-10-23 DIAGNOSIS — B9561 Methicillin susceptible Staphylococcus aureus infection as the cause of diseases classified elsewhere: Secondary | ICD-10-CM | POA: Diagnosis not present

## 2020-10-23 DIAGNOSIS — T8459XA Infection and inflammatory reaction due to other internal joint prosthesis, initial encounter: Secondary | ICD-10-CM | POA: Diagnosis not present

## 2020-10-23 DIAGNOSIS — R6521 Severe sepsis with septic shock: Secondary | ICD-10-CM | POA: Diagnosis not present

## 2020-10-23 DIAGNOSIS — G9341 Metabolic encephalopathy: Secondary | ICD-10-CM

## 2020-10-23 DIAGNOSIS — L02416 Cutaneous abscess of left lower limb: Secondary | ICD-10-CM

## 2020-10-23 DIAGNOSIS — E870 Hyperosmolality and hypernatremia: Secondary | ICD-10-CM

## 2020-10-23 LAB — CBC WITH DIFFERENTIAL/PLATELET
Abs Immature Granulocytes: 0 10*3/uL (ref 0.00–0.07)
Basophils Absolute: 0 10*3/uL (ref 0.0–0.1)
Basophils Relative: 0 %
Eosinophils Absolute: 0.6 10*3/uL — ABNORMAL HIGH (ref 0.0–0.5)
Eosinophils Relative: 8 %
HCT: 29.1 % — ABNORMAL LOW (ref 39.0–52.0)
Hemoglobin: 10.1 g/dL — ABNORMAL LOW (ref 13.0–17.0)
Lymphocytes Relative: 17 %
Lymphs Abs: 1.4 10*3/uL (ref 0.7–4.0)
MCH: 26.2 pg (ref 26.0–34.0)
MCHC: 34.7 g/dL (ref 30.0–36.0)
MCV: 75.4 fL — ABNORMAL LOW (ref 80.0–100.0)
Monocytes Absolute: 0.2 10*3/uL (ref 0.1–1.0)
Monocytes Relative: 3 %
Neutro Abs: 5.8 10*3/uL (ref 1.7–7.7)
Neutrophils Relative %: 72 %
Platelets: 165 10*3/uL (ref 150–400)
RBC: 3.86 MIL/uL — ABNORMAL LOW (ref 4.22–5.81)
RDW: 17.9 % — ABNORMAL HIGH (ref 11.5–15.5)
WBC: 8 10*3/uL (ref 4.0–10.5)
nRBC: 0 % (ref 0.0–0.2)
nRBC: 1 /100 WBC — ABNORMAL HIGH

## 2020-10-23 LAB — BASIC METABOLIC PANEL
Anion gap: 12 (ref 5–15)
BUN: 75 mg/dL — ABNORMAL HIGH (ref 8–23)
CO2: 17 mmol/L — ABNORMAL LOW (ref 22–32)
Calcium: 8 mg/dL — ABNORMAL LOW (ref 8.9–10.3)
Chloride: 117 mmol/L — ABNORMAL HIGH (ref 98–111)
Creatinine, Ser: 2.07 mg/dL — ABNORMAL HIGH (ref 0.61–1.24)
GFR, Estimated: 32 mL/min — ABNORMAL LOW (ref >60)
Glucose, Bld: 342 mg/dL — ABNORMAL HIGH (ref 70–99)
Potassium: 3.8 mmol/L (ref 3.5–5.1)
Sodium: 146 mmol/L — ABNORMAL HIGH (ref 135–145)

## 2020-10-23 LAB — ECHOCARDIOGRAM COMPLETE
Area-P 1/2: 4.15 cm2
Calc EF: 47.2 %
Height: 72 in
S' Lateral: 3.2 cm
Single Plane A2C EF: 35.9 %
Single Plane A4C EF: 60.1 %
Weight: 2483.26 oz

## 2020-10-23 LAB — GLUCOSE, CAPILLARY
Glucose-Capillary: 289 mg/dL — ABNORMAL HIGH (ref 70–99)
Glucose-Capillary: 292 mg/dL — ABNORMAL HIGH (ref 70–99)
Glucose-Capillary: 326 mg/dL — ABNORMAL HIGH (ref 70–99)
Glucose-Capillary: 323 mg/dL — ABNORMAL HIGH (ref 70–99)
Glucose-Capillary: 309 mg/dL — ABNORMAL HIGH (ref 70–99)
Glucose-Capillary: 310 mg/dL — ABNORMAL HIGH (ref 70–99)

## 2020-10-23 LAB — CULTURE, BLOOD (ROUTINE X 2): Special Requests: ADEQUATE

## 2020-10-23 LAB — PHOSPHORUS
Phosphorus: 3.5 mg/dL (ref 2.5–4.6)
Phosphorus: 3 mg/dL (ref 2.5–4.6)

## 2020-10-23 LAB — MAGNESIUM
Magnesium: 2.6 mg/dL — ABNORMAL HIGH (ref 1.7–2.4)
Magnesium: 2.5 mg/dL — ABNORMAL HIGH (ref 1.7–2.4)

## 2020-10-23 LAB — URINE CULTURE: Culture: >=100000 — AB

## 2020-10-23 MED ORDER — OSMOLITE 1.2 CAL PO LIQD
1000.0000 mL | ORAL | Status: DC
  Administered 2020-10-23 – 2020-11-04 (×15): 1000 mL
  Filled 2020-10-23 (×27): qty 1000

## 2020-10-23 MED ORDER — INSULIN ASPART 100 UNIT/ML ~~LOC~~ SOLN
5.0000 [IU] | SUBCUTANEOUS | Status: DC
  Administered 2020-10-23 – 2020-10-26 (×16): 5 [IU] via SUBCUTANEOUS

## 2020-10-23 MED ORDER — CEFAZOLIN SODIUM-DEXTROSE 2-4 GM/100ML-% IV SOLN
2.0000 g | Freq: Two times a day (BID) | INTRAVENOUS | Status: DC
  Administered 2020-10-23: 2 g via INTRAVENOUS
  Filled 2020-10-23 (×2): qty 100

## 2020-10-23 MED ORDER — INSULIN ASPART 100 UNIT/ML ~~LOC~~ SOLN
0.0000 [IU] | SUBCUTANEOUS | Status: DC
  Administered 2020-10-23 (×3): 11 [IU] via SUBCUTANEOUS
  Administered 2020-10-24: 8 [IU] via SUBCUTANEOUS
  Administered 2020-10-24: 11 [IU] via SUBCUTANEOUS
  Administered 2020-10-24: 3 [IU] via SUBCUTANEOUS
  Administered 2020-10-24: 5 [IU] via SUBCUTANEOUS
  Administered 2020-10-24 (×2): 8 [IU] via SUBCUTANEOUS
  Administered 2020-10-25 (×2): 5 [IU] via SUBCUTANEOUS
  Administered 2020-10-25: 8 [IU] via SUBCUTANEOUS
  Administered 2020-10-25 (×2): 5 [IU] via SUBCUTANEOUS
  Administered 2020-10-26: 2 [IU] via SUBCUTANEOUS
  Administered 2020-10-26: 11 [IU] via SUBCUTANEOUS
  Administered 2020-10-26: 2 [IU] via SUBCUTANEOUS
  Administered 2020-10-26 – 2020-10-27 (×2): 5 [IU] via SUBCUTANEOUS
  Administered 2020-10-27: 3 [IU] via SUBCUTANEOUS
  Administered 2020-10-27: 2 [IU] via SUBCUTANEOUS
  Administered 2020-10-27: 3 [IU] via SUBCUTANEOUS
  Administered 2020-10-28: 2 [IU] via SUBCUTANEOUS
  Administered 2020-10-28: 8 [IU] via SUBCUTANEOUS
  Administered 2020-10-28 (×2): 3 [IU] via SUBCUTANEOUS
  Administered 2020-10-28: 5 [IU] via SUBCUTANEOUS
  Administered 2020-10-29 (×3): 2 [IU] via SUBCUTANEOUS
  Administered 2020-10-29: 5 [IU] via SUBCUTANEOUS
  Administered 2020-10-29: 2 [IU] via SUBCUTANEOUS
  Administered 2020-10-29: 3 [IU] via SUBCUTANEOUS
  Administered 2020-10-30 (×2): 5 [IU] via SUBCUTANEOUS
  Administered 2020-10-30: 3 [IU] via SUBCUTANEOUS
  Administered 2020-10-30: 2 [IU] via SUBCUTANEOUS
  Administered 2020-10-30: 5 [IU] via SUBCUTANEOUS
  Administered 2020-10-31: 8 [IU] via SUBCUTANEOUS
  Administered 2020-10-31: 2 [IU] via SUBCUTANEOUS
  Administered 2020-10-31: 3 [IU] via SUBCUTANEOUS
  Administered 2020-10-31 (×4): 5 [IU] via SUBCUTANEOUS
  Administered 2020-11-01 (×2): 3 [IU] via SUBCUTANEOUS
  Administered 2020-11-01: 5 [IU] via SUBCUTANEOUS
  Administered 2020-11-01: 3 [IU] via SUBCUTANEOUS
  Administered 2020-11-01 (×2): 5 [IU] via SUBCUTANEOUS
  Administered 2020-11-02: 3 [IU] via SUBCUTANEOUS
  Administered 2020-11-02 (×2): 5 [IU] via SUBCUTANEOUS
  Administered 2020-11-02: 2 [IU] via SUBCUTANEOUS
  Administered 2020-11-02 (×2): 5 [IU] via SUBCUTANEOUS
  Administered 2020-11-03: 3 [IU] via SUBCUTANEOUS
  Administered 2020-11-03: 5 [IU] via SUBCUTANEOUS
  Administered 2020-11-03: 2 [IU] via SUBCUTANEOUS
  Administered 2020-11-03 (×2): 5 [IU] via SUBCUTANEOUS
  Administered 2020-11-04: 2 [IU] via SUBCUTANEOUS
  Administered 2020-11-04: 3 [IU] via SUBCUTANEOUS
  Administered 2020-11-04: 8 [IU] via SUBCUTANEOUS
  Administered 2020-11-04 (×3): 2 [IU] via SUBCUTANEOUS
  Administered 2020-11-04 – 2020-11-05 (×2): 3 [IU] via SUBCUTANEOUS
  Administered 2020-11-05: 2 [IU] via SUBCUTANEOUS
  Administered 2020-11-05 (×2): 3 [IU] via SUBCUTANEOUS

## 2020-10-23 MED ORDER — FREE WATER
250.0000 mL | Status: DC
  Administered 2020-10-23 – 2020-10-25 (×11): 250 mL

## 2020-10-23 MED ORDER — INSULIN GLARGINE 100 UNIT/ML ~~LOC~~ SOLN
5.0000 [IU] | Freq: Once | SUBCUTANEOUS | Status: AC
  Administered 2020-10-23: 5 [IU] via SUBCUTANEOUS
  Filled 2020-10-23: qty 0.05

## 2020-10-23 MED ORDER — PROSOURCE TF PO LIQD
45.0000 mL | Freq: Every day | ORAL | Status: DC
  Administered 2020-10-24 – 2020-11-07 (×15): 45 mL
  Filled 2020-10-23 (×16): qty 45

## 2020-10-23 MED ORDER — INSULIN GLARGINE 100 UNIT/ML ~~LOC~~ SOLN
10.0000 [IU] | Freq: Two times a day (BID) | SUBCUTANEOUS | Status: DC
  Administered 2020-10-23 – 2020-10-24 (×2): 10 [IU] via SUBCUTANEOUS
  Filled 2020-10-23 (×3): qty 0.1

## 2020-10-23 NOTE — Progress Notes (Signed)
  Echocardiogram 2D Echocardiogram has been performed.  Marcus Downs 10/23/2020, 10:53 AM

## 2020-10-23 NOTE — Progress Notes (Addendum)
NAME:  Marcus Downs., MRN:  [id number], DOB:  1941-01-12, LOS: 2 ADMISSION DATE:  10/20/2020, CONSULTATION DATE: 12/4 REFERRING MD: Dr. 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 Luiz Blare 07/2020. He was sent to the ED from his nursing home for worsening altered mental status, agitation, and findings of AKI with electrolyte abnormalities.  History of present illness   Patient transferred from [facility] for worsening systemic symptoms thought to be secondary to UTI. He was treated for a UTI 09/28/20 and completed 7 day course of cipro. 11/29 the patient was found to be altered with a fever, increased fatigue,decreased functionality, and yellow urine with sediment present. At that time, urine cultures were positive for staph aureus. Other pertinent lab findings of AKI with cr of 1.26, Na of 153. WBC of 10.4. Hgb of 11.9, MCV of 73.8. Nitrofurantoin was started for 5 days and switched to bactrim the following day. Foley catheter was changed 4 days ago. His mental status continued to worsen, patient became non-verbal so he was sent to the ED early this am.  Conversation with staff at [facility] reveal baseline mentation of waxing and waning orientation due to dementia. Mentions often confused but easily redirectable. Able to recognize familiar faces but unable to express needs appropriately. Family also mentions that MarcusDowns's mentation has been in steady decline since his stroke and he has become increasingly confused during the last month.  ED Course: Patient found to be tachycardic, hypotensive. Lab work revealing BUN of 72 and Cr of 3.98, Na of 150, K of 6.1. He was admitted to hospitalist service for severe sepsis  secondary to MSSA CAUTI. CT A/P revealed prominent fluid collection of the left hip, lateral to arthroplasty hardware. [place] was performed of the area, revealed complex fluid collection. Ortho/IR were consulted as area was suspicious for left hip abscess.   PCCM was consulted due to patient's worsening sepsis with possible need for pressors.   Past Medical History   Past Medical History:  Diagnosis Date  . CRI (chronic renal insufficiency)   . 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 Diabetes mellitus type II 1992  . HLD (hyperlipidemia) 10/1998  . HTN (hypertension) 10/1998  . Primary open angle glaucoma    [name]  . RBBB   . Stroke [facility])    Left sided weakness   Significant Hospital Events   12/4 Admitted  Consults:  PCCM  Procedures:  12/4 Left Hip Fluid Collection Drain  Significant Diagnostic Tests:  CXR 12/4 - No active disease CT A/P w/o C 12/4 - prominent fluid like collection lateral to left hip hardware, indicative of acute abscess, but unable to rule out chronic seroma/hematoma LE [place] 12/4 - complex fluid collection left hip, postoperative hematoma/seroma. 12 cm along lateral aspect of the left thigh  Micro Data:  Prior to admission: Marcus Downs, [facility] 12/02 + for staph aureus BC x 2 12/4 - Pending UC 12/4 - Pending  Antimicrobials:  12/04 - Cefepime/Linezolid. Both DC'd changed to ceftriaxone by ID Cefazolin 12/4-  Interim history/subjective:  Off pressors >48hrs, no overnight events,   Objective   Blood pressure 135/78, pulse (!) 125, temperature 99.7 F (37.6  C), temperature source Axillary, resp. rate (!) 22, height 6' (1.829 m), weight 70.4 kg, SpO2 100 %.        Intake/Output Summary (Last 24 hours) at 10/23/2020 0754 Last data filed at 10/23/2020 0700 Gross per 24 hour  Intake 1858.95 ml  Output 1765 ml  Net 93.95 ml   Filed Weights   11/04/2020 0139 10/22/20 0530 10/23/20 0700  Weight: 70 kg 68.8 kg 70.4 kg     Examination: General: Elderly male, chronically ill-appearing, resting and in no distress HENT: NGT in place, mm moist, pupils equal and responsive to light Lungs: Clear to auscultation bilaterally, decreased air entry RLL Cardiovascular: Tachycardic, S1-S2, regular Abdomen: Soft, nontender nondistended Extremities: Dry, no edema, L hip drain with dark maroon drainage  Neuro: Left-sided hemiparesis, following commands with the RUE and RLE GU: Foley in place  Assessment & Plan:   Septic Shock, shock state resolved pressures stable after fluid resuscitation Suspected Left Hip Abscess S/P left hip arthroplasty 07/2020 Catheter Associated UTI MSSA bacteremia Plan: -Antibiotics deescalated to Ancef, ID following, will need to be treated as a peri-prosthetic joint infection with long term antibiotic therapy and patient is not a surgical candidate, anticipate adding Rifampin after clearance of blood cultures if MSSA is susceptible to Rifampin. -repeat BC drawn 12/5 - echo to look for veg is pending -Was initially moved to ICU for hypotension and pressor requirement, now off pressors >48hrs.  Plan to transfer back to progressive today  SVT HR still persistently >120 with PVC's Plan: -As needed IV metoprolol for rates greater than 160. -Increase scheduled metoprolol to 25 mg every 8 hours down NG tube.  Acute Metabolic Encephalopathy History of CVA Plan: -Related to sepsis and pain, improved and following commands -Continue to observe, baseline dementia vascular dementia and previous stroke  AKI Prerenal Secondary to Sepsis Hypernatremia Hyperkalemia Plan -creatinine improving today with 1300cc UOP yesterday -continue tube feeds with free water to help correct sodium, increase to 250cc/hr -Continue rehydration with gut if possible -Continue to follow urine output and trend serum creatinine.  Microcytic Anemia History of iron deficiency anemia. Baseline Hgb of 12.3, appears to  be at baseline. Will not give iron in context of active infection. Plan: -hgb stable and no sign of active bleeding.  -Elevated Liver Enzymes Appears to be secondary to septic shock. Plan: Improved  T2DM  Last A1c of 8.1 Plan: -glucose 290-340 over last day, change SSI from very sensitive to sensitive, continue TF coverage and Lantus 10 units qhs  Hx of Dysphagia NG tube Plan: -continue  Best practice:   Diet: Tube feeds Pain/Anxiety/Delirium protocol (if indicated): tylenol 650 mg oral/rectal mild pain/fever, dilaudid .5 mg q3h PRN severe pain. VAP protocol (if indicated): None DVT prophylaxis: SCD GI prophylaxis: Protonix Glucose control: SSI Mobility: Bed Rest Code Status: Full Family Communication: Spoke with Patient's son and updated by phone Disposition: stable to transfer back to the floor today  Labs   CBC: Recent Labs  Lab 11/17/2020 0100 10/24/2020 0404 10/22/20 0032 10/22/20 0512 10/23/20 0454  WBC 13.2*  --   --  9.9 8.0  NEUTROABS 11.7*  --   --  8.1* 5.8  HGB 12.4* 9.5* 10.6* 10.5* 10.1*  HCT 37.1* 28.0* 31.3* 29.9* 29.1*  MCV 78.1*  --   --  74.9* 75.4*  PLT 268  --   --  210 165    Basic Metabolic Panel: Recent Labs  Lab 10/23/2020 1010 11/12/2020 1437 11/05/2020 2057 10/22/20 0032 10/22/20 0512 10/22/20  4235 10/22/20 1841 10/23/20 0454  NA 146* 145 146* 145 146*  --   --   --   K 5.3* 6.0* 6.0* 5.7* 4.9  --   --   --   CL 116* 115* 115* 115* 115*  --   --   --   CO2 17* 15* 18* 18* 20*  --   --   --   GLUCOSE 241* 280* 305* 257* 202*  --   --   --   BUN 73* 75* 74* 77* 75*  --   --   --   CREATININE 3.65* 3.65* 3.28* 3.39* 3.16*  --   --   --   CALCIUM 8.0* 8.2* 8.0* 7.9* 8.0*  --   --   --   MG  --   --   --   --  2.4 2.4 2.6* 2.6*  PHOS  --   --   --   --  4.5 4.5 4.1 3.5   GFR: Estimated Creatinine Clearance: 19.2 mL/min (A) (by C-G formula based on SCr of 3.16 mg/dL (H)). Recent Labs  Lab 11-10-20 0100 11/10/20 0102 11/10/2020 0722  November 10, 2020 1010 Nov 10, 2020 1437 11/10/2020 2057 10/22/20 0512 10/23/20 0454  PROCALCITON  --   --  11.07  --   --   --   --   --   WBC 13.2*  --   --   --   --   --  9.9 8.0  LATICACIDVEN  --    < > 2.6* 3.1* 4.4* 2.6*  --   --    < > = values in this interval not displayed.    Liver Function Tests: Recent Labs  Lab 10-Nov-2020 0100 10/22/20 0512  AST 123* 70*  ALT 58* 39  ALKPHOS 203* 138*  BILITOT 1.6* 0.5  PROT 8.1 6.4*  ALBUMIN 1.5* 1.2*   No results for input(s): LIPASE, AMYLASE in the last 168 hours. Recent Labs  Lab 11/10/2020 0722  AMMONIA <9*    ABG    Component Value Date/Time   HCO3 15.5 (L) Nov 10, 2020 0404   TCO2 16 (L) 2020-11-10 0404   ACIDBASEDEF 7.0 (H) November 10, 2020 0404   O2SAT 79.0 November 10, 2020 0404     Coagulation Profile: Recent Labs  Lab 11/10/2020 0100  INR 1.4*    Cardiac Enzymes: No results for input(s): CKTOTAL, CKMB, CKMBINDEX, TROPONINI in the last 168 hours.  HbA1C: Hgb A1c MFr Bld  Date/Time Value Ref Range Status  2020/11/10 01:00 AM 8.1 (H) 4.8 - 5.6 % Final    Comment:    (Downs) Pre diabetes:          5.7%-6.4%  Diabetes:              >6.4%  Glycemic control for   <7.0% adults with diabetes   08/06/2020 06:01 PM 7.7 (H) 4.8 - 5.6 % Final    Comment:    (Downs) Pre diabetes:          5.7%-6.4%  Diabetes:              >6.4%  Glycemic control for   <7.0% adults with diabetes     CBG: Recent Labs  Lab 10/22/20 1103 10/22/20 1536 10/22/20 2014 10/22/20 2350 10/23/20 0339  GLUCAP 223* 302* 266* 310* 289*    CRITICAL CARE Performed by: Darcella Gasman Gleason   Total critical care time: 35 minutes  Critical care time was exclusive of separately billable procedures and treating other patients.  Critical  care was necessary to treat or prevent imminent or life-threatening deterioration.  Critical care was time spent personally by me on the following activities: development of treatment plan with patient and/or surrogate as  well as nursing, discussions with consultants, evaluation of patient's response to treatment, examination of patient, obtaining history from patient or surrogate, ordering and performing treatments and interventions, ordering and review of laboratory studies, ordering and review of radiographic studies, pulse oximetry and re-evaluation of patient's condition.  Darcella Gasman Gleason, PA-C [facility]  Pager# [phone], if no answer [phone]   Pulmonary critical care attending:  This is a 79 year old gentleman past medical history of vascular dementia, prior CVA, left-sided hemiparesis and weakness at baseline, hypertension, type 2 diabetes found to have a left-sided hip infection, history of left hemiarthroplasty in September 2021.  Patient halves staph bacteremia.  Currently on cefazolin.  BP 137/65 (BP Location: Right Arm)   Pulse (!) 103   Temp 99 F (37.2 C) (Axillary)   Resp 18   Ht 6' (1.829 m)   Wt 70.4 kg   SpO2 97%   BMI 21.05 kg/m   General: Elderly gentleman chronically ill-appearing resting in bed. HEENT: NG tube in place Heart: Tachycardic, regular, S1-S2 Lungs: Diminished bilaterally no crackles no wheeze Hip: Left drain in place with brownish-tan fluid.  Labs: Reviewed  Echocardiogram pending  Assessment: Septic shock, resolved MSSA bacteremia Left hip abscess status post drain Has hardware in place. SVT Acute metabolic encephalopathy History of dementia History of CVA AKI  Plan: Patient stable for transfer from the intensive care unit. Continue Ancef Once blood cultures documented is clear will likely need PICC line for prolonged antibiotic course. Appreciate infectious disease input. Echocardiogram pending. Uptitrated oral metoprolol to help with rate control. NG tube swapped out to core track Continue tube feeds  Josephine Igo, DO [facility] 10/23/2020 2:21 PM

## 2020-10-23 NOTE — Progress Notes (Signed)
Subjective: Patient minimally responsive today   Antibiotics:  Anti-infectives (From admission, onward)   Start     Dose/Rate Route Frequency Ordered Stop   10/22/20 1000  ceFAZolin (ANCEF) IVPB 1 g/50 mL premix        1 g 100 mL/hr over 30 Minutes Intravenous Every 12 hours Jan 10, 2020 1745     Jan 10, 2020 2200  ceFAZolin (ANCEF) IVPB 2g/100 mL premix        2 g 200 mL/hr over 30 Minutes Intravenous  Once Jan 10, 2020 1739 Jan 10, 2020 2239   Jan 10, 2020 2100  cefTRIAXone (ROCEPHIN) 2 g in sodium chloride 0.9 % 100 mL IVPB  Status:  Discontinued        2 g 200 mL/hr over 30 Minutes Intravenous Every 24 hours Jan 10, 2020 1117 Jan 10, 2020 1129   Jan 10, 2020 1730  linezolid (ZYVOX) IVPB 600 mg  Status:  Discontinued        600 mg 300 mL/hr over 60 Minutes Intravenous Every 12 hours Jan 10, 2020 1154 Jan 10, 2020 1739   Jan 10, 2020 1130  cefTRIAXone (ROCEPHIN) 2 g in sodium chloride 0.9 % 100 mL IVPB  Status:  Discontinued        2 g 200 mL/hr over 30 Minutes Intravenous Every 24 hours Jan 10, 2020 1129 Jan 10, 2020 1739   Jan 10, 2020 1000  meropenem (MERREM) 500 mg in sodium chloride 0.9 % 100 mL IVPB  Status:  Discontinued        500 mg 200 mL/hr over 30 Minutes Intravenous Every 12 hours Jan 10, 2020 0330 Jan 10, 2020 1117   Jan 10, 2020 0400  linezolid (ZYVOX) IVPB 600 mg        600 mg 300 mL/hr over 60 Minutes Intravenous  Once Jan 10, 2020 0309 Jan 10, 2020 0654   Jan 10, 2020 0215  ceFEPIme (MAXIPIME) 2 g in sodium chloride 0.9 % 100 mL IVPB        2 g 200 mL/hr over 30 Minutes Intravenous  Once Jan 10, 2020 0212 Jan 10, 2020 0306   Jan 10, 2020 0215  ampicillin-sulbactam (UNASYN) 1.5 g in sodium chloride 0.9 % 100 mL IVPB  Status:  Discontinued        1.5 g 200 mL/hr over 30 Minutes Intravenous  Once Jan 10, 2020 0212 Jan 10, 2020 0214   Jan 10, 2020 0215  vancomycin (VANCOCIN) IVPB 1000 mg/200 mL premix  Status:  Discontinued        1,000 mg 200 mL/hr over 60 Minutes Intravenous  Once Jan 10, 2020 0212 Jan 10, 2020 0314      Medications: Scheduled Meds: .  brimonidine  1 drop Both Eyes TID  . Chlorhexidine Gluconate Cloth  6 each Topical Daily  . feeding supplement (PROSource TF)  45 mL Per Tube BID  . feeding supplement (VITAL HIGH PROTEIN)  1,000 mL Per Tube Q24H  . free water  200 mL Per Tube Q4H  . heparin injection (subcutaneous)  5,000 Units Subcutaneous Q8H  . insulin aspart  0-6 Units Subcutaneous Q4H  . insulin aspart  2 Units Subcutaneous Q4H  . insulin glargine  10 Units Subcutaneous BID  . latanoprost  1 drop Both Eyes QHS  . metoprolol tartrate  12.5 mg Per Tube Q8H  . pantoprazole (PROTONIX) IV  40 mg Intravenous QHS  . sodium chloride flush  5 mL Intracatheter Q8H   Continuous Infusions: . sodium chloride 10 mL/hr at 10/23/20 0400  .  ceFAZolin (ANCEF) IV 1 g (10/23/20 0913)   PRN Meds:.sodium chloride, acetaminophen, acetaminophen, docusate, HYDROmorphone (DILAUDID) injection, oxyCODONE, polyethylene glycol    Objective: Weight change: 1.6 kg  Intake/Output Summary (Last 24 hours) at 10/23/2020  1041 Last data filed at 10/23/2020 0915 Gross per 24 hour  Intake 1552.3 ml  Output 2024 ml  Net -471.7 ml   Blood pressure 132/74, pulse (!) 126, temperature 99.7 F (37.6 C), temperature source Axillary, resp. rate (!) 35, height 6' (1.829 m), weight 70.4 kg, SpO2 100 %. Temp:  [97.4 F (36.3 C)-99.7 F (37.6 C)] 99.7 F (37.6 C) (12/06 0751) Pulse Rate:  [78-126] 126 (12/06 0904) Resp:  [16-35] 35 (12/06 0800) BP: (86-138)/(53-87) 132/74 (12/06 0904) SpO2:  [100 %] 100 % (12/06 0800) Weight:  [70.4 kg] 70.4 kg (12/06 0700)  Physical Exam: General: Somnolent HEENT: anicteric sclera, EOMI CVS tachycardic regular rate, normal  Chest: , no wheezing, no respiratory distress Abdomen: soft non-distended,  Extremities: Left hip with drain in place Skin: no rashes Neuro: nonfocal  CBC:    BMET Recent Labs    10/22/20 0512 10/23/20 0454  NA 146* 146*  K 4.9 3.8  CL 115* 117*  CO2 20* 17*  GLUCOSE 202*  342*  BUN 75* 75*  CREATININE 3.16* 2.07*  CALCIUM 8.0* 8.0*     Liver Panel  Recent Labs    11/12/2020 0100 10/22/20 0512  PROT 8.1 6.4*  ALBUMIN 1.5* 1.2*  AST 123* 70*  ALT 58* 39  ALKPHOS 203* 138*  BILITOT 1.6* 0.5  BILIDIR  --  0.3*  IBILI  --  0.2*       Sedimentation Rate Recent Labs    10/22/20 0512  ESRSEDRATE 105*   C-Reactive Protein Recent Labs    10/22/20 0512  CRP 27.6*    Micro Results: Recent Results (from the past 720 hour(s))  Culture, blood (Routine x 2)     Status: Abnormal   Collection Time: 11/10/2020  1:00 AM   Specimen: BLOOD  Result Value Ref Range Status   Specimen Description BLOOD SITE NOT SPECIFIED  Final   Special Requests   Final    BOTTLES DRAWN AEROBIC AND ANAEROBIC Blood Culture adequate volume   Culture  Setup Time   Final    GRAM POSITIVE COCCI IN CLUSTERS IN BOTH AEROBIC AND ANAEROBIC BOTTLES CRITICAL VALUE NOTED.  VALUE IS CONSISTENT WITH PREVIOUSLY REPORTED AND CALLED VALUE.    Culture (A)  Final    STAPHYLOCOCCUS AUREUS SUSCEPTIBILITIES PERFORMED ON PREVIOUS CULTURE WITHIN THE LAST 5 DAYS. Performed at [facility], [address], [place], [place] [address]    Report Status 10/23/2020 FINAL  Final  Culture, blood (Routine x 2)     Status: Abnormal   Collection Time: 11/13/2020  1:00 AM   Specimen: BLOOD  Result Value Ref Range Status   Specimen Description BLOOD SITE NOT SPECIFIED  Final   Special Requests   Final    BOTTLES DRAWN AEROBIC AND ANAEROBIC Blood Culture results may not be optimal due to an inadequate volume of blood received in culture bottles   Culture  Setup Time   Final    GRAM POSITIVE COCCI IN CLUSTERS IN BOTH AEROBIC AND ANAEROBIC BOTTLES CRITICAL RESULT CALLED TO, READ BACK BY AND VERIFIED WITH: E MARTEN,PHARMD AT 1717 11/02/2020 BY L BENFIELD Performed at [facility], [address] [place], [place] [address]    Culture STAPHYLOCOCCUS AUREUS (A)  Final   Report Status  10/23/2020 FINAL  Final   Organism ID, Bacteria STAPHYLOCOCCUS AUREUS  Final      Susceptibility   Staphylococcus aureus - MIC*    CIPROFLOXACIN >=8 RESISTANT Resistant  ERYTHROMYCIN <=0.25 SENSITIVE Sensitive     GENTAMICIN <=0.5 SENSITIVE Sensitive     OXACILLIN 0.5 SENSITIVE Sensitive     TETRACYCLINE <=1 SENSITIVE Sensitive     VANCOMYCIN <=0.5 SENSITIVE Sensitive     TRIMETH/SULFA <=10 SENSITIVE Sensitive     CLINDAMYCIN <=0.25 SENSITIVE Sensitive     RIFAMPIN <=0.5 SENSITIVE Sensitive     Inducible Clindamycin NEGATIVE Sensitive     * STAPHYLOCOCCUS AUREUS  Blood Culture ID Panel (Reflexed)     Status: Abnormal   Collection Time: 2020/10/26  1:00 AM  Result Value Ref Range Status   Enterococcus faecalis NOT DETECTED NOT DETECTED Final   Enterococcus Faecium NOT DETECTED NOT DETECTED Final   Listeria monocytogenes NOT DETECTED NOT DETECTED Final   Staphylococcus species DETECTED (A) NOT DETECTED Final    Comment: CRITICAL RESULT CALLED TO, READ BACK BY AND VERIFIED WITH: E MARTEN,PHARMD AT 1717 10-26-20 BY L BENFIELD    Staphylococcus aureus (BCID) DETECTED (A) NOT DETECTED Final    Comment: CRITICAL RESULT CALLED TO, READ BACK BY AND VERIFIED WITH: E MARTEN,PHARMD AT 1717 Oct 26, 2020 BY L BENFIELD    Staphylococcus epidermidis NOT DETECTED NOT DETECTED Final   Staphylococcus lugdunensis NOT DETECTED NOT DETECTED Final   Streptococcus species NOT DETECTED NOT DETECTED Final   Streptococcus agalactiae NOT DETECTED NOT DETECTED Final   Streptococcus pneumoniae NOT DETECTED NOT DETECTED Final   Streptococcus pyogenes NOT DETECTED NOT DETECTED Final   A.calcoaceticus-baumannii NOT DETECTED NOT DETECTED Final   Bacteroides fragilis NOT DETECTED NOT DETECTED Final   Enterobacterales NOT DETECTED NOT DETECTED Final   Enterobacter cloacae complex NOT DETECTED NOT DETECTED Final   Escherichia coli NOT DETECTED NOT DETECTED Final   Klebsiella aerogenes NOT DETECTED NOT DETECTED Final    Klebsiella oxytoca NOT DETECTED NOT DETECTED Final   Klebsiella pneumoniae NOT DETECTED NOT DETECTED Final   Proteus species NOT DETECTED NOT DETECTED Final   Salmonella species NOT DETECTED NOT DETECTED Final   Serratia marcescens NOT DETECTED NOT DETECTED Final   Haemophilus influenzae NOT DETECTED NOT DETECTED Final   Neisseria meningitidis NOT DETECTED NOT DETECTED Final   Pseudomonas aeruginosa NOT DETECTED NOT DETECTED Final   Stenotrophomonas maltophilia NOT DETECTED NOT DETECTED Final   Candida albicans NOT DETECTED NOT DETECTED Final   Candida auris NOT DETECTED NOT DETECTED Final   Candida glabrata NOT DETECTED NOT DETECTED Final   Candida krusei NOT DETECTED NOT DETECTED Final   Candida parapsilosis NOT DETECTED NOT DETECTED Final   Candida tropicalis NOT DETECTED NOT DETECTED Final   Cryptococcus neoformans/gattii NOT DETECTED NOT DETECTED Final   Meth resistant mecA/C and MREJ NOT DETECTED NOT DETECTED Final    Comment: Performed at [facility], [address], [place], [place] [address]  Resp Panel by RT-PCR (Flu A&B, Covid) Nasopharyngeal Swab     Status: None   Collection Time: 10/26/2020  2:46 AM   Specimen: Nasopharyngeal Swab; Nasopharyngeal(NP) swabs in vial transport medium  Result Value Ref Range Status   SARS Coronavirus 2 by RT PCR NEGATIVE NEGATIVE Final    Comment: (NOTE) SARS-CoV-2 target nucleic acids are NOT DETECTED.  The SARS-CoV-2 RNA is generally detectable in upper respiratory specimens during the acute phase of infection. The lowest concentration of SARS-CoV-2 viral copies this assay can detect is 138 copies/mL. A negative result does not preclude SARS-Cov-2 infection and should not be used as the sole basis for treatment or other patient management decisions. A negative result may occur with  improper specimen collection/handling, submission of specimen other than nasopharyngeal swab, presence of viral mutation(s) within the areas targeted  by this assay, and inadequate number of viral copies(<138 copies/mL). A negative result must be combined with clinical observations, patient history, and epidemiological information. The expected result is Negative.  Fact Sheet for Patients:  BloggerCourse.com  Fact Sheet for Healthcare Providers:  SeriousBroker.it  This test is no t yet approved or cleared by the [place] FDA and  has been authorized for detection and/or diagnosis of SARS-CoV-2 by FDA under an Emergency Use Authorization (EUA). This EUA will remain  in effect (meaning this test can be used) for the duration of the COVID-19 declaration under Section 564(b)(1) of the Act, 21 U.S.C.section 360bbb-3(b)(1), unless the authorization is terminated  or revoked sooner.       Influenza A by PCR NEGATIVE NEGATIVE Final   Influenza B by PCR NEGATIVE NEGATIVE Final    Comment: (NOTE) The Xpert Xpress SARS-CoV-2/FLU/RSV plus assay is intended as an aid in the diagnosis of influenza from Nasopharyngeal swab specimens and should not be used as a sole basis for treatment. Nasal washings and aspirates are unacceptable for Xpert Xpress SARS-CoV-2/FLU/RSV testing.  Fact Sheet for Patients: BloggerCourse.com  Fact Sheet for Healthcare Providers: SeriousBroker.it  This test is not yet approved or cleared by the [place] FDA and has been authorized for detection and/or diagnosis of SARS-CoV-2 by FDA under an Emergency Use Authorization (EUA). This EUA will remain in effect (meaning this test can be used) for the duration of the COVID-19 declaration under Section 564(b)(1) of the Act, 21 U.S.C. section 360bbb-3(b)(1), unless the authorization is terminated or revoked.  Performed at [facility], [address] [place], [place] [address]   Urine culture     Status: Abnormal (Preliminary result)   Collection Time:  Oct 29, 2020  2:48 AM   Specimen: In/Out Cath Urine  Result Value Ref Range Status   Specimen Description IN/OUT CATH URINE  Final   Special Requests NONE  Final   Culture (A)  Final    >=100,000 COLONIES/mL STAPHYLOCOCCUS AUREUS SUSCEPTIBILITIES TO FOLLOW Performed at [facility], [address] [place], [place] [address]    Report Status PENDING  Incomplete  Aerobic/Anaerobic Culture (surgical/deep wound)     Status: None (Preliminary result)   Collection Time: 10-29-20  1:47 PM   Specimen: Abscess  Result Value Ref Range Status   Specimen Description ABSCESS  Final   Special Requests LEFT THIGH/HIP ABSC  Final   Gram Stain   Final    FEW WBC PRESENT, PREDOMINANTLY PMN MODERATE GRAM POSITIVE COCCI IN PAIRS IN CLUSTERS    Culture   Final    ABUNDANT STAPHYLOCOCCUS AUREUS SUSCEPTIBILITIES TO FOLLOW Performed at [facility], [address], [place], [place] [address]    Report Status PENDING  Incomplete  MRSA PCR Screening     Status: None   Collection Time: 2020-10-29  3:12 PM   Specimen: Nasopharyngeal  Result Value Ref Range Status   MRSA by PCR NEGATIVE NEGATIVE Final    Comment:        The GeneXpert MRSA Assay (FDA approved for NASAL specimens only), is one component of a comprehensive MRSA colonization surveillance program. It is not intended to diagnose MRSA infection nor to guide or monitor treatment for MRSA infections. Performed at [facility], [address], [place], [place] [address]   Culture, blood (routine x 2)  Status: None (Preliminary result)   Collection Time: 10/22/20 11:40 AM   Specimen: BLOOD LEFT ARM  Result Value Ref Range Status   Specimen Description BLOOD LEFT ARM  Final   Special Requests   Final    BOTTLES DRAWN AEROBIC AND ANAEROBIC Blood Culture adequate volume   Culture   Final    NO GROWTH < 24 HOURS Performed at [facility], [address] [place], [place] [address]    Report Status PENDING  Incomplete   Culture, blood (routine x 2)     Status: None (Preliminary result)   Collection Time: 10/22/20 11:55 AM   Specimen: BLOOD LEFT HAND  Result Value Ref Range Status   Specimen Description BLOOD LEFT HAND  Final   Special Requests   Final    BOTTLES DRAWN AEROBIC AND ANAEROBIC Blood Culture adequate volume   Culture   Final    NO GROWTH < 24 HOURS Performed at [facility], [address] [place], [place] [address]    Report Status PENDING  Incomplete    Studies/Results: DG Abd Portable 1V  Result Date: 2020-10-23 CLINICAL DATA:  NG tube placement. EXAM: PORTABLE ABDOMEN - 1 VIEW COMPARISON:  None. FINDINGS: NG tube tip is in the stomach. Proximal side port of the NG tube is below the GE junction. IMPRESSION: NG tube tip is in the stomach. Electronically Signed   By: Kennith Center M.D.   On: 10/23/20 18:39   CT IMAGE GUIDED DRAINAGE BY PERCUTANEOUS CATHETER  Result Date: 10/23/2020 INDICATION: 79 year old with sepsis and evidence for an abscess in the left upper thigh and hip region. History of left hip arthroplasty. EXAM: CT-GUIDED DRAIN PLACEMENT IN LEFT THIGH ABSCESS MEDICATIONS: None ANESTHESIA/SEDATION: None COMPLICATIONS: None immediate. PROCEDURE: Informed consent was obtained from the patient's son after a thorough discussion of the procedural risks, benefits and alternatives. All questions were addressed. A timeout was performed prior to the initiation of the procedure. Patient was placed on the CT scanner and images through the left upper thigh were obtained. Abscess in the lateral left upper thigh was identified with CT. The overlying skin was prepped with chlorhexidine and sterile field was created. Maximal barrier sterile technique was utilized including caps, mask, sterile gowns, sterile gloves, sterile drape, hand hygiene and skin antiseptic. Skin was anesthetized using 1% lidocaine. Using CT guidance, an 18 gauge trocar needle was directed into the abscess and pink  colored purulent fluid was aspirated. Superstiff Amplatz wire was advanced into the abscess and tract was dilated to accommodate a 12 [place] multipurpose drain. 180 mL of purulent fluid was removed. Follow up CT images were obtained. Catheter was sutured to skin and attached to a suction bulb. FINDINGS: Patient has a left hip arthroplasty. Extensive subcutaneous edema in the left upper thigh with air-fluid collection along the lateral aspect of the left hip and left upper thigh. In addition, there is low-density and enlargement of the left obturator externus muscle and the left gluteus medius muscle. There is concern for additional abscess collections in these areas. The large abscess along the left lateral thigh was decompressed following drain placement. IMPRESSION: CT-guided drainage of the abscess in the left lateral thigh and hip region. 180 mL of purulent fluid was removed. Fluid sample sent for culture. Concern for additional intramuscular abscesses around the left hip involving the left obturator externus and left gluteal musculature. Electronically Signed   By: Richarda Overlie M.D.   On: 23-Oct-2020 14:07  Assessment/Plan:  INTERVAL HISTORY: Culture sent   Principal Problem:   MSSA bacteremia Active Problems:   Severe sepsis (HCC)    Marcus Downs. is a 79 y.o. male with with MSSA bacteremia and infected left total hip arthroplasty status post IR guided aspirate of fluid is of course also growing staph aureus repeat blood cultures been taken  Orthopedics do not feel that he is a good operative candidate at this point in time.  2D echocardiogram has been done but not yet read.  Repeat blood cultures been taken.  Continue cefazolin for now I do not think he has clear-cut evidence of embolization to his central nervous system but certainly should he develop focal signs I would switch him to nafcillin.  We will plan on adding rifampin once his blood cultures clear.     LOS: 2  days   Acey Lav 10/23/2020, 10:41 AM

## 2020-10-23 NOTE — Progress Notes (Signed)
PT Cancellation Note  Patient Details Name: Marcus Downs. MRN: [id number] DOB: [birth year]   Cancelled Treatment:    Reason Eval/Treat Not Completed: Patient not medically ready (pt currently with HR at rest 128, discussed with critical care NP who stated bedrest)   Maija B Prater 10/23/2020, 8:47 AM [name], PT Acute Rehabilitation Services Pager: [phone] Office: [phone]

## 2020-10-23 NOTE — Progress Notes (Signed)
OT Cancellation Note  Patient Details Name: Marcus Downs. MRN: [id number] DOB: May 24, 1941   Cancelled Treatment:    Reason Eval/Treat Not Completed: Patient not medically ready--pt currently with HR at rest 128, PT discussed with critical care NP who stated bedrest at this time. Will follow and see as able/appropriate.   Barry Brunner, OT Acute Rehabilitation Services Pager [phone] Office [phone]  Chancy Milroy 10/23/2020, 9:04 AM

## 2020-10-23 NOTE — Progress Notes (Signed)
Initial Nutrition Assessment  DOCUMENTATION CODES:   Severe malnutrition in context of chronic illness  INTERVENTION:   Continue tube feeds via Cortrak: - Change to Osmolite 1.2 @ 65 ml/hr (1560 ml/day) - ProSource 45 ml daily - Free water per CCM, currently 250 ml q 4 hours  Tube feeding regimen provides 1912 kcal, 98 grams of protein, and 1279 ml of H2O.  Total free water with flushes: 2779 ml  NUTRITION DIAGNOSIS:   Severe Malnutrition related to chronic illness (dementia) as evidenced by severe fat depletion, severe muscle depletion, percent weight loss (15.4% weight loss in 9 months).  GOAL:   Patient will meet greater than or equal to 90% of their needs  MONITOR:   Diet advancement, Labs, Weight trends, TF tolerance, Skin, I & O's  REASON FOR ASSESSMENT:   Consult Enteral/tube feeding initiation and management  ASSESSMENT:   78 year old male who presented to the ED on 12/04 as a code sepsis. PMH of HTN, vascular dementia, T2DM, GERD, dysphagia, chronic urinary retention with indwelling foley catheter, HLD, previous CVA with left hemiparesis, left femoral neck fracture s/p left hemiarthroplasty in September 2021. Pt admitted with severe sepsis secondary to UTI, MSSA bacteremia, possible left hip abscess.   12/04 - s/p CT guided abscess drain placement by IR, NG tube placed 12/06 - NG tube replaced with Cortrak, tip gastric  Discussed pt with RN and during ICU rounds. Consult received for tube feeding initiation and management. Will adjust TF orders to better meet pt's needs.  Met with pt at bedside. Cortrak placed today. Pt unable to communicate with RD at this time.  Reviewed weight history in chart. Pt with a 12.8 kg weight loss since 01/31/20. This is a 15.4% weight loss in 9 months which is significant for timeframe. Pt meets criteria for severe malnutrition.  Admit weight: 70 kg Current weight: 70.4 kg  Current TF: Vital High Protein @ 40 ml/hr, ProSource TF  45 ml BID, free water 250 ml q 4 hours  Medications reviewed and include: SSI q 4 hours, novolog 2 units q 4 hours, lantus 10 units BID, IV protonix, IV abx  Labs reviewed: sodium 146, BUN 75, creatinine 2.07, magnesium 2.6 CBG's: 266-326 x 24 hours  UOP: 1350 ml x 24 hours NGT output: 250 ml x 24 hours Left hip JP drain: 165 ml x 24 hours I/O's: +2.5 L since admit  NUTRITION - FOCUSED PHYSICAL EXAM:    Most Recent Value  Orbital Region Moderate depletion  Upper Arm Region Moderate depletion  Thoracic and Lumbar Region Severe depletion  Buccal Region Severe depletion  Temple Region Moderate depletion  Clavicle Bone Region Severe depletion  Clavicle and Acromion Bone Region Severe depletion  Scapular Bone Region Moderate depletion  Dorsal Hand Moderate depletion  Patellar Region Severe depletion  Anterior Thigh Region Severe depletion  Posterior Calf Region Severe depletion  Edema (RD Assessment) None  Hair Reviewed  Eyes Reviewed  Mouth Reviewed  Skin Reviewed  Nails Reviewed       Diet Order:   Diet Order            Diet NPO time specified  Diet effective now                 EDUCATION NEEDS:   Not appropriate for education at this time  Skin:  Skin Assessment: Skin Integrity Issues: Stage II: right buttocks  Last BM:  10/22/20 medium type 5  Height:   Ht Readings from Last 1 Encounters:  11/06/2020 6' (1.829 m)    Weight:   Wt Readings from Last 1 Encounters:  10/23/20 70.4 kg    BMI:  Body mass index is 21.05 kg/m.  Estimated Nutritional Needs:   Kcal:  1900-2100  Protein:  95-115 grams  Fluid:  >/= 2.0 L    Gustavus Bryant, MS, RD, LDN Inpatient Clinical Dietitian Please see AMiON for contact information.

## 2020-10-23 NOTE — Progress Notes (Signed)
SLP Cancellation Note  Patient Details Name: Marcus Downs. MRN: [id number] DOB: 05/16/1941   Cancelled treatment:        Pt received Oxycodone this am for pain. Briefly attempted to arouse- opens eyes however cannot maintain. Continue NPO with oral care. ST will continue efforts.    Royce Macadamia 10/23/2020, 9:13 AM    Breck Coons Lonell Face.Ed Nurse, children's [phone] Office [phone]

## 2020-10-23 NOTE — Progress Notes (Signed)
ORTHO  Remains Afeb and WBC down further. Seems less tender to hip ROM as well.  Plan to leave drain until output is scant. Continues on IV kefzol for MSSA.  Follow up CT later in week.  Hope is that we can leave components and suppress with long term antibiotic coverage.  Obviously not ideal treatment but think the stress of surgery and significant blood loss associated with explant/[place] would come with significant risk of mortality.

## 2020-10-23 NOTE — Progress Notes (Signed)
PHARMACY NOTE:  ANTIMICROBIAL RENAL DOSAGE ADJUSTMENT  Current antimicrobial regimen includes a mismatch between antimicrobial dosage and estimated renal function.  As per policy approved by the Pharmacy & Therapeutics and Medical Executive Committees, the antimicrobial dosage will be adjusted accordingly.  Current antimicrobial dosage:  Cefazolin 1g IV every 12 hours  Indication: MSSA bacteremia and hip infection with hardware  Renal Function: Improving some - adjusting up to aggressive dosing with bacteremia  Estimated Creatinine Clearance: 29.3 mL/min (A) (by C-G formula based on SCr of 2.07 mg/dL (H)). []      On intermittent HD, scheduled: []      On CRRT    Antimicrobial dosage has been changed to:  Cefazolin 2g IV every 12 hours   Additional comments:   Thank you for allowing pharmacy to be a part of this patient's care.  , PharmD, BCPS, BCCCP Clinical Pharmacist Please refer to [facility] for [facility] numbers 10/23/2020 2:07 PM

## 2020-10-23 NOTE — Procedures (Signed)
Cortrak  Person Inserting Tube:  Otilia Kareem, RD Tube Type:  Cortrak - 43 inches Tube Location:  Left nare Initial Placement:  Stomach Secured by: Bridle Technique Used to Measure Tube Placement:  Documented cm marking at nare/ corner of mouth Cortrak Secured At:  60 cm   No x-ray is required. RN may begin using tube.   If the tube becomes dislodged please keep the tube and contact the Cort[facility] team at [email or web address] (password [email or web address]) for replacement.  If after hours and replacement cannot be delayed, place a NG tube and confirm placement with an abdominal x-ray.    Noelle Sease RD, LDN Clinical Nutrition Pager listed in AMION   

## 2020-10-23 NOTE — Progress Notes (Signed)
Referring Physician(s): Choi, J/Icard, B.  Supervising Physician: Ruel Favors  Patient Status:  [facility] - In-pt  Chief Complaint: Left hip/thigh abscess s/p drain placed in IR 2020/11/16.   Subjective: Patient in bed, mostly unresponsive; occasional soft moaning. No obvious pain to palpation of left thigh drain site.   Allergies: Tape  Medications: Prior to Admission medications   Medication Sig Start Date End Date Taking? Authorizing Provider  acetaminophen (TYLENOL) 325 MG tablet Take 650 mg by mouth every 6 (six) hours as needed for mild pain or fever. 08/14/20  Yes [provider]  Amino Acids-Protein Hydrolys (FEEDING SUPPLEMENT, PRO-STAT 64,) LIQD Take 30 mLs by mouth in the morning and at bedtime. For wound healing 08/15/20  Yes [provider]  aspirin 325 MG tablet Take 325 mg by mouth daily.   Yes [provider]  atorvastatin (LIPITOR) 40 MG tablet Take 1 tablet (40 mg total) by mouth daily. 08/14/20  Yes Rhetta Mura, MD  bisacodyl (DULCOLAX) 10 MG suppository Place 10 mg rectally as needed for moderate constipation.   Yes [provider]  brimonidine (ALPHAGAN P) 0.1 % SOLN Place 1 drop into both eyes 3 (three) times daily.   Yes [provider]  ferrous sulfate 325 (65 FE) MG tablet Take 325 mg by mouth daily.  08/31/18  Yes [name], MD  Insulin Aspart Prot & Aspart (NOVOLOG MIX 70/30 FLEXPEN [place]) Inject 14-18 Units into the skin See admin instructions. Inject 14 Units after breakfast,  Inject 18 units after lunch, inject 16 uints at dinner.   Yes [provider]  latanoprost (XALATAN) 0.005 % ophthalmic solution Place 1 drop into both eyes at bedtime.   Yes [provider]  magnesium hydroxide (MILK OF MAGNESIA) 400 MG/5ML suspension Take 30 mLs by mouth daily as needed for mild constipation.    Yes [provider]  metoprolol tartrate (LOPRESSOR) 25 MG tablet Take 1 tablet (25 mg total) by  mouth 2 (two) times daily. 08/14/20  Yes Rhetta Mura, MD  MULTIPLE VITAMIN PO Take 1 tablet by mouth daily. W/ Minerals to promote wound healing   Yes [provider]  NON FORMULARY Medpass TID d/t weight loss. 09/13/20  Yes [provider]  potassium chloride (KLOR-CON) 10 MEQ tablet TAKE 3 TABLETS BY MOUTH DAILY Patient taking differently: 30 mEq daily.  09/11/20  Yes [name], MD  Sodium Phosphates (RA SALINE ENEMA RE) Place 1 enema rectally daily as needed (for constipation).   Yes [provider]  nitrofurantoin (MACRODANTIN) 100 MG capsule Take 100 mg by mouth 2 (two) times daily. For UTI 10/19/20 10/23/20  [provider]  sodium chloride 0.9 % infusion Inject 1,000 mLs into the vein daily. 272ml/ 1 hour    [provider]  sulfamethoxazole-trimethoprim (BACTRIM DS) 800-160 MG tablet Take 1 tablet by mouth 2 (two) times daily. For 10 days    [provider]  sulfamethoxazole-trimethoprim (BACTRIM) 400-80 MG tablet Take 1 tablet by mouth 2 (two) times daily. For 10 days    [provider]     Vital Signs: BP 137/65 (BP Location: Right Arm)   Pulse (!) 103   Temp 99 F (37.2 C) (Axillary)   Resp 18   Ht 6' (1.829 m)   Wt 155 lb 3.3 oz (70.4 kg)   SpO2 97%   BMI 21.05 kg/m   Physical Exam Constitutional:      General: He is not in acute distress.  Appearance: He is ill-appearing.     Comments: Eyes closed, occasional moaning.   Pulmonary:     Effort: Pulmonary effort is normal.  Musculoskeletal:     Comments: Left hip/thigh abscess drain. Dressing is clean and dry. Approximately 25 ml of sanguinopurulent fluid in JP bulb.      Imaging: CT ABDOMEN PELVIS WO CONTRAST  Result Date: November 09, 2020 CLINICAL DATA:  Sepsis EXAM: CT ABDOMEN AND PELVIS WITHOUT CONTRAST TECHNIQUE: Multidetector CT imaging of the abdomen and pelvis was performed following the standard protocol without IV contrast.  COMPARISON:  None. FINDINGS: Lower chest: Mild bibasilar atelectasis. Hepatobiliary: No focal liver abnormality is seen. Gallbladder is unremarkable. No bile duct dilatation. Pancreas: Partially infiltrated with fat but otherwise unremarkable. No peripancreatic fluid. Spleen: Unremarkable, although evaluation is limited by motion artifact. Adrenals/Urinary Tract: Adrenal glands are unremarkable. Kidneys are unremarkable without stone or hydronephrosis. No perinephric fluid. Bladder is decompressed by Foley catheter. Stomach/Bowel: No dilated large or small bowel loops. No convincing evidence of an acute bowel wall inflammation. Stomach is unremarkable, partially decompressed. Vascular/Lymphatic: Aortic atherosclerosis. No enlarged lymph nodes are seen in the abdomen or pelvis. Reproductive: Prostate is unremarkable. Other: No free fluid or abscess collection is seen within the abdomen or pelvis. No free intraperitoneal air. Musculoskeletal: Degenerative spondylosis of the lumbar spine, moderate in degree. No acute appearing osseous abnormality. LEFT hip arthroplasty hardware in place. Prominent fluid-like collection lateral to the LEFT hip arthroplasty hardware, incompletely imaged, of uncertain age, containing small foci of air. IMPRESSION: 1. Prominent fluid-like collection lateral to the LEFT hip arthroplasty hardware, incompletely imaged, of uncertain age but small foci of air are seen within the collection which could indicate acute abscess. Alternatively, this could represent chronic seroma or hematoma. Consider further characterization with ultrasound. 2. No additional acute or significant findings within the abdomen or pelvis. No bowel obstruction or evidence of an acute bowel wall inflammation. No free fluid or abscess collection is seen within the abdomen or pelvis. No evidence of acute solid organ abnormality. No renal or ureteral stone. Aortic Atherosclerosis (ICD10-I70.0). Electronically Signed   By:  Bary Richard M.D.   On: 2020/11/09 04:51   DG Chest 1 View  Result Date: 11-09-2020 CLINICAL DATA:  Suspected sepsis. EXAM: CHEST  1 VIEW COMPARISON:  08/09/2020 FINDINGS: The heart size and mediastinal contours are within normal limits. Both lungs are clear. The visualized skeletal structures are unremarkable. IMPRESSION: No active disease. Electronically Signed   By: Katherine Mantle M.D.   On: 2020-11-09 02:12   DG Abd Portable 1V  Result Date: 11/09/20 CLINICAL DATA:  NG tube placement. EXAM: PORTABLE ABDOMEN - 1 VIEW COMPARISON:  None. FINDINGS: NG tube tip is in the stomach. Proximal side port of the NG tube is below the GE junction. IMPRESSION: NG tube tip is in the stomach. Electronically Signed   By: Kennith Center M.D.   On: 11/09/20 18:39   ECHOCARDIOGRAM COMPLETE  Result Date: 10/23/2020    ECHOCARDIOGRAM REPORT   Patient Name:   Marcus Downs. Date of Exam: 10/23/2020 Medical Rec #:  [id number]         Height:       72.0 in Accession #:    [id number]        Weight:       155.2 lb Date of Birth:  [birth year]          BSA:          1.913 m Patient Age:  79 years          BP:           135/78 mmHg Patient Gender: M                 HR:           136 bpm. Exam Location:  Inpatient Procedure: 2D Echo, Cardiac Doppler and Color Doppler Indications:    Bacteremia.  History:        Patient has no prior history of Echocardiogram examinations.                 Abnormal ECG and Ventricular bigeminy; Risk                 Factors:Hypertension, Diabetes, Dyslipidemia and Former Smoker.  Sonographer:    Sheralyn Boatman RDCS Referring Phys: [id number] BRADLEY L ICARD IMPRESSIONS  1. Very difficult study due to intermittent tachycardia and intermittent ventricular bigeminy. EF appears low normal 50-55%. Left ventricular ejection fraction, by estimation, is 50 to 55%. The left ventricle has low normal function. The left ventricle has no regional wall motion abnormalities. Left ventricular diastolic function could  not be evaluated.  2. Right ventricular systolic function is normal. The right ventricular size is normal. Tricuspid regurgitation signal is inadequate for assessing PA pressure.  3. The mitral valve is grossly normal. No evidence of mitral valve regurgitation. No evidence of mitral stenosis.  4. The NCC contains focal calcification. There is no regurgitation to suggest this is vegetation. The aortic valve is tricuspid. There is mild calcification of the aortic valve. Aortic valve regurgitation is not visualized. Mild aortic valve sclerosis is present, with no evidence of aortic valve stenosis.  5. The inferior vena cava is normal in size with greater than 50% respiratory variability, suggesting right atrial pressure of 3 mmHg. Conclusion(s)/Recommendation(s): No evidence of valvular vegetations on this transthoracic echocardiogram. Would recommend a transesophageal echocardiogram to exclude infective endocarditis if clinically indicated. FINDINGS  Left Ventricle: Very difficult study due to intermittent tachycardia and intermittent ventricular bigeminy. EF appears low normal 50-55%. Left ventricular ejection fraction, by estimation, is 50 to 55%. The left ventricle has low normal function. The left ventricle has no regional wall motion abnormalities. The left ventricular internal cavity size was normal in size. There is no left ventricular hypertrophy. Left ventricular diastolic function could not be evaluated due to nondiagnostic images. Left  ventricular diastolic function could not be evaluated. Right Ventricle: The right ventricular size is normal. No increase in right ventricular wall thickness. Right ventricular systolic function is normal. Tricuspid regurgitation signal is inadequate for assessing PA pressure. Left Atrium: Left atrial size was normal in size. Right Atrium: Right atrial size was normal in size. Pericardium: There is no evidence of pericardial effusion. Mitral Valve: The mitral valve is  grossly normal. No evidence of mitral valve regurgitation. No evidence of mitral valve stenosis. Tricuspid Valve: The tricuspid valve is grossly normal. Tricuspid valve regurgitation is trivial. No evidence of tricuspid stenosis. Aortic Valve: The NCC contains focal calcification. There is no regurgitation to suggest this is vegetation. The aortic valve is tricuspid. There is mild calcification of the aortic valve. Aortic valve regurgitation is not visualized. Mild aortic valve sclerosis is present, with no evidence of aortic valve stenosis. Pulmonic Valve: The pulmonic valve was grossly normal. Pulmonic valve regurgitation is not visualized. No evidence of pulmonic stenosis. Aorta: The aortic root and ascending aorta are structurally normal, with no evidence of dilitation. Venous: The inferior  vena cava is normal in size with greater than 50% respiratory variability, suggesting right atrial pressure of 3 mmHg. IAS/Shunts: The atrial septum is grossly normal. Additional Comments: There is a small pleural effusion in the left lateral region.  LEFT VENTRICLE PLAX 2D LVIDd:         4.50 cm LVIDs:         3.20 cm LV PW:         1.30 cm LV IVS:        0.90 cm LVOT diam:     2.00 cm LV SV:         57 LV SV Index:   30 LVOT Area:     3.14 cm  LV Volumes (MOD) LV vol d, MOD A2C: 42.9 ml LV vol d, MOD A4C: 62.1 ml LV vol s, MOD A2C: 27.5 ml LV vol s, MOD A4C: 24.8 ml LV SV MOD A2C:     15.4 ml LV SV MOD A4C:     62.1 ml LV SV MOD BP:      25.9 ml RIGHT VENTRICLE            IVC RV S prime:     9.32 cm/s  IVC diam: 1.80 cm TAPSE (M-mode): 1.4 cm LEFT ATRIUM             Index       RIGHT ATRIUM           Index LA diam:        2.50 cm 1.31 cm/m  RA Area:     11.40 cm LA Vol (A2C):   16.2 ml 8.47 ml/m  RA Volume:   25.00 ml  13.07 ml/m LA Vol (A4C):   20.0 ml 10.46 ml/m LA Biplane Vol: 18.8 ml 9.83 ml/m  AORTIC VALVE LVOT Vmax:   99.20 cm/s LVOT Vmean:  70.000 cm/s LVOT VTI:    0.183 m  AORTA Ao Root diam: 3.30 cm Ao Asc  diam:  3.00 cm MITRAL VALVE MV Area (PHT): 4.15 cm    SHUNTS MV Decel Time: 183 msec    Systemic VTI:  0.18 m MV E velocity: 67.50 cm/s  Systemic Diam: 2.00 cm MV A velocity: 88.70 cm/s MV E/A ratio:  0.76 Lennie Odor MD Electronically signed by Lennie Odor MD Signature Date/Time: 10/23/2020/11:56:59 AM    Final    [place] LT LOWER EXTREM LTD SOFT TISSUE NON VASCULAR  Result Date: 10/27/2020 CLINICAL DATA:  Possible abscess EXAM: ULTRASOUND LEFT LOWER EXTREMITY LIMITED TECHNIQUE: Ultrasound examination of the lower extremity soft tissues was performed in the area of clinical concern. COMPARISON:  None. FINDINGS: Scanning in the area of clinical concern adjacent to the left hip prosthesis demonstrates a complex fluid collection likely representing a postoperative seroma/hematoma. Possibility of underlying infection could not be totally excluded on the basis of this exam. This corresponds to soft tissue density lateral to the proximal femur on recent CT examination. IMPRESSION: Complex fluid collection as described likely representing postoperative hematoma/seroma. This extends for at least 12 cm along the lateral aspect of the left thigh. Electronically Signed   By: Alcide Clever M.D.   On: 11/03/2020 08:26   CT IMAGE GUIDED DRAINAGE BY PERCUTANEOUS CATHETER  Result Date: 11/12/2020 INDICATION: 79 year old with sepsis and evidence for an abscess in the left upper thigh and hip region. History of left hip arthroplasty. EXAM: CT-GUIDED DRAIN PLACEMENT IN LEFT THIGH ABSCESS MEDICATIONS: None ANESTHESIA/SEDATION: None COMPLICATIONS: None immediate. PROCEDURE: Informed consent was obtained from  the patient's son after a thorough discussion of the procedural risks, benefits and alternatives. All questions were addressed. A timeout was performed prior to the initiation of the procedure. Patient was placed on the CT scanner and images through the left upper thigh were obtained. Abscess in the lateral left upper thigh  was identified with CT. The overlying skin was prepped with chlorhexidine and sterile field was created. Maximal barrier sterile technique was utilized including caps, mask, sterile gowns, sterile gloves, sterile drape, hand hygiene and skin antiseptic. Skin was anesthetized using 1% lidocaine. Using CT guidance, an 18 gauge trocar needle was directed into the abscess and pink colored purulent fluid was aspirated. Superstiff Amplatz wire was advanced into the abscess and tract was dilated to accommodate a 12 [place] multipurpose drain. 180 mL of purulent fluid was removed. Follow up CT images were obtained. Catheter was sutured to skin and attached to a suction bulb. FINDINGS: Patient has a left hip arthroplasty. Extensive subcutaneous edema in the left upper thigh with air-fluid collection along the lateral aspect of the left hip and left upper thigh. In addition, there is low-density and enlargement of the left obturator externus muscle and the left gluteus medius muscle. There is concern for additional abscess collections in these areas. The large abscess along the left lateral thigh was decompressed following drain placement. IMPRESSION: CT-guided drainage of the abscess in the left lateral thigh and hip region. 180 mL of purulent fluid was removed. Fluid sample sent for culture. Concern for additional intramuscular abscesses around the left hip involving the left obturator externus and left gluteal musculature. Electronically Signed   By: Richarda Overlie M.D.   On: 10/27/2020 14:07    Labs:  CBC: Recent Labs    09/25/20 0000 09/25/20 0000 11/01/2020 0100 10/20/2020 0100 11/15/2020 0404 10/22/20 0032 10/22/20 0512 10/23/20 0454  WBC 10.5  --  13.2*  --   --   --  9.9 8.0  HGB 14.6   < > 12.4*   < > 9.5* 10.6* 10.5* 10.1*  HCT 43   < > 37.1*   < > 28.0* 31.3* 29.9* 29.1*  PLT 383  --  268  --   --   --  210 165   < > = values in this interval not displayed.    COAGS: Recent Labs    10/23/2020 0100   INR 1.4*  APTT 42*    BMP: Recent Labs    08/12/20 0540 08/12/20 0540 08/13/20 0548 08/13/20 0548 08/21/20 0000 08/21/20 0000 09/25/20 0000 10/31/2020 0100 11/07/2020 2057 10/22/20 0032 10/22/20 0512 10/23/20 0454  NA 146*   < > 142  --  138   < > 142   < > 146* 145 146* 146*  K 3.8   < > 3.9   < > 4.7   < > 4.3   < > 6.0* 5.7* 4.9 3.8  CL 108   < > 110   < > 102   < > 100   < > 115* 115* 115* 117*  CO2 26   < > 22   < > 25*   < > 23*   < > 18* 18* 20* 17*  GLUCOSE 221*   < > 161*  --   --   --   --    < > 305* 257* 202* 342*  BUN 25*   < > 24*  --  10   < > 9   < > 74* 77* 75*  75*  CALCIUM 7.9*   < > 7.6*   < > 8.5*   < > 9.7   < > 8.0* 7.9* 8.0* 8.0*  CREATININE 1.34*   < > 1.04  --  0.8   < > 1.1   < > 3.28* 3.39* 3.16* 2.07*  GFRNONAA 50*   < > >60   < > 83.93   < > 62.96   < > 19* 18* 19* 32*  GFRAA 58*  --  >60  --  >90  --  72.97  --   --   --   --   --    < > = values in this interval not displayed.    LIVER FUNCTION TESTS: Recent Labs    08/11/20 0553 08/11/20 0553 08/12/20 0540 08/21/20 0000 Apr 03, 2020 0100 10/22/20 0512  BILITOT 1.2  --  1.1  --  1.6* 0.5  AST 129*   < > 109* 23 123* 70*  ALT 52*   < > 54* 21 58* 39  ALKPHOS 139*  --  146*  --  203* 138*  PROT 6.3*  --  6.4*  --  8.1 6.4*  ALBUMIN 2.2*  --  2.1*  --  1.5* 1.2*   < > = values in this interval not displayed.    Assessment and Plan:  Left hip/thigh abscess s/p drain placed in IR 11/12/2020: Afebrile, WBC count 8.0. Epic 24 hour output is 165 ml. Patient had another 25 ml of sanguinopurulent fluid in bulb.   Continue flushing drain three times daily and document output. Change dressing daily or as needed. Will plan to repeat imaging once output is minimal or if clinical status worsens.   Other plans per primary teams.   Electronically Signed: Alwyn RenJamie Covington, AGACNP-BC [phone] 10/23/2020, 2:24 PM   I spent a total of 15 Minutes at the the patient's bedside AND on the patient's  hospital floor or unit, greater than 50% of which was counseling/coordinating care for left hip/thigh abscess drain.

## 2020-10-24 ENCOUNTER — Inpatient Hospital Stay (HOSPITAL_COMMUNITY): Payer: Medicare Other

## 2020-10-24 DIAGNOSIS — R7989 Other specified abnormal findings of blood chemistry: Secondary | ICD-10-CM

## 2020-10-24 DIAGNOSIS — A4101 Sepsis due to Methicillin susceptible Staphylococcus aureus: Secondary | ICD-10-CM | POA: Diagnosis not present

## 2020-10-24 DIAGNOSIS — E870 Hyperosmolality and hypernatremia: Secondary | ICD-10-CM

## 2020-10-24 DIAGNOSIS — R7881 Bacteremia: Secondary | ICD-10-CM

## 2020-10-24 DIAGNOSIS — N179 Acute kidney failure, unspecified: Secondary | ICD-10-CM

## 2020-10-24 DIAGNOSIS — L02416 Cutaneous abscess of left lower limb: Secondary | ICD-10-CM

## 2020-10-24 DIAGNOSIS — T8459XA Infection and inflammatory reaction due to other internal joint prosthesis, initial encounter: Secondary | ICD-10-CM | POA: Diagnosis not present

## 2020-10-24 DIAGNOSIS — B9561 Methicillin susceptible Staphylococcus aureus infection as the cause of diseases classified elsewhere: Secondary | ICD-10-CM | POA: Diagnosis not present

## 2020-10-24 DIAGNOSIS — R6520 Severe sepsis without septic shock: Secondary | ICD-10-CM | POA: Diagnosis not present

## 2020-10-24 DIAGNOSIS — E43 Unspecified severe protein-calorie malnutrition: Secondary | ICD-10-CM

## 2020-10-24 LAB — CBC WITH DIFFERENTIAL/PLATELET
Abs Immature Granulocytes: 0.02 10*3/uL (ref 0.00–0.07)
Basophils Absolute: 0 10*3/uL (ref 0.0–0.1)
Basophils Relative: 0 %
Eosinophils Absolute: 0.3 10*3/uL (ref 0.0–0.5)
Eosinophils Relative: 4 %
HCT: 30.6 % — ABNORMAL LOW (ref 39.0–52.0)
Hemoglobin: 10.7 g/dL — ABNORMAL LOW (ref 13.0–17.0)
Immature Granulocytes: 0 %
Lymphocytes Relative: 15 %
Lymphs Abs: 1.1 10*3/uL (ref 0.7–4.0)
MCH: 26.2 pg (ref 26.0–34.0)
MCHC: 35 g/dL (ref 30.0–36.0)
MCV: 74.8 fL — ABNORMAL LOW (ref 80.0–100.0)
Monocytes Absolute: 0.2 10*3/uL (ref 0.1–1.0)
Monocytes Relative: 3 %
Neutro Abs: 5.5 10*3/uL (ref 1.7–7.7)
Neutrophils Relative %: 78 %
Platelets: 166 10*3/uL (ref 150–400)
RBC: 4.09 MIL/uL — ABNORMAL LOW (ref 4.22–5.81)
RDW: 17.9 % — ABNORMAL HIGH (ref 11.5–15.5)
WBC: 7.1 10*3/uL (ref 4.0–10.5)
nRBC: 0 % (ref 0.0–0.2)

## 2020-10-24 LAB — GLUCOSE, CAPILLARY
Glucose-Capillary: 303 mg/dL — ABNORMAL HIGH (ref 70–99)
Glucose-Capillary: 266 mg/dL — ABNORMAL HIGH (ref 70–99)
Glucose-Capillary: 256 mg/dL — ABNORMAL HIGH (ref 70–99)
Glucose-Capillary: 269 mg/dL — ABNORMAL HIGH (ref 70–99)
Glucose-Capillary: 190 mg/dL — ABNORMAL HIGH (ref 70–99)
Glucose-Capillary: 244 mg/dL — ABNORMAL HIGH (ref 70–99)

## 2020-10-24 LAB — BASIC METABOLIC PANEL
Anion gap: 10 (ref 5–15)
BUN: 64 mg/dL — ABNORMAL HIGH (ref 8–23)
CO2: 22 mmol/L (ref 22–32)
Calcium: 8.1 mg/dL — ABNORMAL LOW (ref 8.9–10.3)
Chloride: 116 mmol/L — ABNORMAL HIGH (ref 98–111)
Creatinine, Ser: 1.8 mg/dL — ABNORMAL HIGH (ref 0.61–1.24)
GFR, Estimated: 38 mL/min — ABNORMAL LOW (ref >60)
Glucose, Bld: 372 mg/dL — ABNORMAL HIGH (ref 70–99)
Potassium: 3.8 mmol/L (ref 3.5–5.1)
Sodium: 148 mmol/L — ABNORMAL HIGH (ref 135–145)

## 2020-10-24 MED ORDER — LORAZEPAM 2 MG/ML IJ SOLN
0.5000 mg | Freq: Once | INTRAMUSCULAR | Status: AC | PRN
  Administered 2020-10-27: 0.5 mg via INTRAVENOUS
  Filled 2020-10-24: qty 1

## 2020-10-24 MED ORDER — LORAZEPAM 2 MG/ML IJ SOLN
0.5000 mg | Freq: Once | INTRAMUSCULAR | Status: AC
  Administered 2020-10-24: 0.5 mg via INTRAVENOUS
  Filled 2020-10-24: qty 1

## 2020-10-24 MED ORDER — INSULIN GLARGINE 100 UNIT/ML ~~LOC~~ SOLN
20.0000 [IU] | Freq: Two times a day (BID) | SUBCUTANEOUS | Status: DC
  Administered 2020-10-24 – 2020-10-25 (×2): 20 [IU] via SUBCUTANEOUS
  Filled 2020-10-24 (×4): qty 0.2

## 2020-10-24 MED ORDER — METOPROLOL TARTRATE 25 MG/10 ML ORAL SUSPENSION
25.0000 mg | Freq: Three times a day (TID) | ORAL | Status: DC
  Administered 2020-10-24 – 2020-10-25 (×3): 25 mg
  Filled 2020-10-24 (×3): qty 10

## 2020-10-24 MED ORDER — INSULIN GLARGINE 100 UNIT/ML ~~LOC~~ SOLN
10.0000 [IU] | Freq: Once | SUBCUTANEOUS | Status: AC
  Administered 2020-10-24: 10 [IU] via SUBCUTANEOUS
  Filled 2020-10-24: qty 0.1

## 2020-10-24 MED ORDER — CEFAZOLIN SODIUM-DEXTROSE 2-4 GM/100ML-% IV SOLN
2.0000 g | Freq: Three times a day (TID) | INTRAVENOUS | Status: DC
  Administered 2020-10-24 – 2020-11-13 (×61): 2 g via INTRAVENOUS
  Filled 2020-10-24 (×72): qty 100

## 2020-10-24 NOTE — Progress Notes (Signed)
OT Cancellation Note  Patient Details Name: Graves Nipp. MRN: [id number] DOB: 21-Jul-1941   Cancelled Treatment:    Reason Eval/Treat Not Completed: Patient not medically ready --pt remains with tachycardia and bedrest. OT will sign off and await new order when appropriate.    Barry Brunner, OT Acute Rehabilitation Services Pager [phone] Office [phone]   Chancy Milroy 10/24/2020, 8:00 AM

## 2020-10-24 NOTE — Progress Notes (Signed)
2200: Tele monitor alarming Vent Bigeminy - EKG obtained and placed in chart. Elink called about results. Morning labs already ordered and Metoprolol is scheduled. Will continue to monitor.

## 2020-10-24 NOTE — Progress Notes (Signed)
PHARMACY NOTE:  ANTIMICROBIAL RENAL DOSAGE ADJUSTMENT  Current antimicrobial regimen includes a mismatch between antimicrobial dosage and estimated renal function.  As per policy approved by the Pharmacy & Therapeutics and Medical Executive Committees, the antimicrobial dosage will be adjusted accordingly.  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 []      On intermittent HD, scheduled: []      On CRRT    Antimicrobial dosage has been changed to:  Cefazolin 2g IV every 8 hours   Additional comments:   Thank you for allowing pharmacy to be a part of this patient's care.  , PharmD, BCPS, BCIDP Infectious Diseases Clinical Pharmacist Phone: [phone] Please refer to AMION for [facility] numbers 10/24/2020 8:04 AM

## 2020-10-24 NOTE — Progress Notes (Signed)
SLP Cancellation Note  Patient Details Name: Marcus Downs. MRN: [id number] DOB: [birth year]   Cancelled treatment:       Reason Eval/Treat Not Completed: Patient's level of consciousness. Pt not sufficiently responsive for PO trials   Cable Fearn, Riley Nearing 10/24/2020, 9:30 AM

## 2020-10-24 NOTE — Progress Notes (Signed)
Subjective: More interactive but still fairly somnolent  Antibiotics:  Anti-infectives (From admission, onward)   Start     Dose/Rate Route Frequency Ordered Stop   10/24/20 0815  ceFAZolin (ANCEF) IVPB 2g/100 mL premix        2 g 200 mL/hr over 30 Minutes Intravenous Every 8 hours 10/24/20 0804     10/23/20 2200  ceFAZolin (ANCEF) IVPB 2g/100 mL premix  Status:  Discontinued        2 g 200 mL/hr over 30 Minutes Intravenous Every 12 hours 10/23/20 1406 10/24/20 0804   10/22/20 1000  ceFAZolin (ANCEF) IVPB 1 g/50 mL premix  Status:  Discontinued        1 g 100 mL/hr over 30 Minutes Intravenous Every 12 hours 10/29/2020 1745 10/23/20 1406   11/08/2020 2200  ceFAZolin (ANCEF) IVPB 2g/100 mL premix        2 g 200 mL/hr over 30 Minutes Intravenous  Once 10/25/2020 1739 11/09/2020 2239   10/26/2020 2100  cefTRIAXone (ROCEPHIN) 2 g in sodium chloride 0.9 % 100 mL IVPB  Status:  Discontinued        2 g 200 mL/hr over 30 Minutes Intravenous Every 24 hours 11/09/2020 1117 11/10/2020 1129   10/22/2020 1730  linezolid (ZYVOX) IVPB 600 mg  Status:  Discontinued        600 mg 300 mL/hr over 60 Minutes Intravenous Every 12 hours 10/30/2020 1154 10/27/2020 1739   10/29/2020 1130  cefTRIAXone (ROCEPHIN) 2 g in sodium chloride 0.9 % 100 mL IVPB  Status:  Discontinued        2 g 200 mL/hr over 30 Minutes Intravenous Every 24 hours 11/11/2020 1129 10/24/2020 1739   10/20/2020 1000  meropenem (MERREM) 500 mg in sodium chloride 0.9 % 100 mL IVPB  Status:  Discontinued        500 mg 200 mL/hr over 30 Minutes Intravenous Every 12 hours 10/20/2020 0330 10/22/2020 1117   11/04/2020 0400  linezolid (ZYVOX) IVPB 600 mg        600 mg 300 mL/hr over 60 Minutes Intravenous  Once 11/09/2020 0309 11/14/2020 0654   10/27/2020 0215  ceFEPIme (MAXIPIME) 2 g in sodium chloride 0.9 % 100 mL IVPB        2 g 200 mL/hr over 30 Minutes Intravenous  Once 11/05/2020 0212 11/09/2020 0306   11/08/2020 0215  ampicillin-sulbactam (UNASYN) 1.5 g in sodium  chloride 0.9 % 100 mL IVPB  Status:  Discontinued        1.5 g 200 mL/hr over 30 Minutes Intravenous  Once 10/31/2020 0212 11/01/2020 0214   11/09/2020 0215  vancomycin (VANCOCIN) IVPB 1000 mg/200 mL premix  Status:  Discontinued        1,000 mg 200 mL/hr over 60 Minutes Intravenous  Once 10/25/2020 0212 11/10/2020 0314      Medications: Scheduled Meds: . brimonidine  1 drop Both Eyes TID  . Chlorhexidine Gluconate Cloth  6 each Topical Daily  . feeding supplement (PROSource TF)  45 mL Per Tube Daily  . free water  250 mL Per Tube Q4H  . heparin injection (subcutaneous)  5,000 Units Subcutaneous Q8H  . insulin aspart  0-15 Units Subcutaneous Q4H  . insulin aspart  5 Units Subcutaneous Q4H  . insulin glargine  10 Units Subcutaneous BID  . latanoprost  1 drop Both Eyes QHS  . metoprolol tartrate  12.5 mg Per Tube Q8H  . pantoprazole (PROTONIX) IV  40 mg Intravenous QHS  .  sodium chloride flush  5 mL Intracatheter Q8H   Continuous Infusions: . sodium chloride 10 mL/hr at 10/24/20 0500  .  ceFAZolin (ANCEF) IV 2 g (10/24/20 0901)  . feeding supplement (OSMOLITE 1.2 CAL) 1,000 mL (10/24/20 0711)   PRN Meds:.sodium chloride, acetaminophen, docusate, HYDROmorphone (DILAUDID) injection, oxyCODONE, polyethylene glycol    Objective: Weight change: -0.4 kg  Intake/Output Summary (Last 24 hours) at 10/24/2020 1037 Last data filed at 10/24/2020 0936 Gross per 24 hour  Intake 1360.22 ml  Output 1185 ml  Net 175.22 ml   Blood pressure 135/71, pulse (!) 127, temperature 97.8 F (36.6 C), temperature source Axillary, resp. rate (!) 30, height 6' (1.829 m), weight 70 kg, SpO2 100 %. Temp:  [97.5 F (36.4 C)-99.7 F (37.6 C)] 97.8 F (36.6 C) (12/07 0700) Pulse Rate:  [103-127] 127 (12/07 0907) Resp:  [18-30] 30 (12/07 0600) BP: (107-145)/(48-99) 135/71 (12/07 0907) SpO2:  [95 %-100 %] 100 % (12/07 0600) Weight:  [70 kg] 70 kg (12/07 0500)  Physical Exam: General: Somnolent HEENT: anicteric  sclera, EOMI CVS tachycardic regular rate, normal  Chest: , no wheezing, no respiratory distress Abdomen: soft non-distended,  Extremities: Left hip with drain in place with brown material. Skin: no rashes Neuro: nonfocal  CBC:    BMET Recent Labs    10/23/20 0454 10/24/20 0132  NA 146* 148*  K 3.8 3.8  CL 117* 116*  CO2 17* 22  GLUCOSE 342* 372*  BUN 75* 64*  CREATININE 2.07* 1.80*  CALCIUM 8.0* 8.1*     Liver Panel  Recent Labs    10/22/20 0512  PROT 6.4*  ALBUMIN 1.2*  AST 70*  ALT 39  ALKPHOS 138*  BILITOT 0.5  BILIDIR 0.3*  IBILI 0.2*       Sedimentation Rate Recent Labs    10/22/20 0512  ESRSEDRATE 105*   C-Reactive Protein Recent Labs    10/22/20 0512  CRP 27.6*    Micro Results: Recent Results (from the past 720 hour(s))  Culture, blood (Routine x 2)     Status: Abnormal   Collection Time: 11/12/2020  1:00 AM   Specimen: BLOOD  Result Value Ref Range Status   Specimen Description BLOOD SITE NOT SPECIFIED  Final   Special Requests   Final    BOTTLES DRAWN AEROBIC AND ANAEROBIC Blood Culture adequate volume   Culture  Setup Time   Final    GRAM POSITIVE COCCI IN CLUSTERS IN BOTH AEROBIC AND ANAEROBIC BOTTLES CRITICAL VALUE NOTED.  VALUE IS CONSISTENT WITH PREVIOUSLY REPORTED AND CALLED VALUE.    Culture (A)  Final    STAPHYLOCOCCUS AUREUS SUSCEPTIBILITIES PERFORMED ON PREVIOUS CULTURE WITHIN THE LAST 5 DAYS. Performed at [facility], [address] [place], [place] [address]    Report Status 10/23/2020 FINAL  Final  Culture, blood (Routine x 2)     Status: Abnormal   Collection Time: 11/03/2020  1:00 AM   Specimen: BLOOD  Result Value Ref Range Status   Specimen Description BLOOD SITE NOT SPECIFIED  Final   Special Requests   Final    BOTTLES DRAWN AEROBIC AND ANAEROBIC Blood Culture results may not be optimal due to an inadequate volume of blood received in culture bottles   Culture  Setup Time   Final    GRAM POSITIVE  COCCI IN CLUSTERS IN BOTH AEROBIC AND ANAEROBIC BOTTLES CRITICAL RESULT CALLED TO, READ BACK BY AND VERIFIED WITH: E MARTEN,PHARMD AT 1717 11/06/2020 BY L BENFIELD Performed at  [facility], [address] [place] [place], [place] [address]    Culture STAPHYLOCOCCUS AUREUS (A)  Final   Report Status 10/23/2020 FINAL  Final   Organism ID, Bacteria STAPHYLOCOCCUS AUREUS  Final      Susceptibility   Staphylococcus aureus - MIC*    CIPROFLOXACIN >=8 RESISTANT Resistant     ERYTHROMYCIN <=0.25 SENSITIVE Sensitive     GENTAMICIN <=0.5 SENSITIVE Sensitive     OXACILLIN 0.5 SENSITIVE Sensitive     TETRACYCLINE <=1 SENSITIVE Sensitive     VANCOMYCIN <=0.5 SENSITIVE Sensitive     TRIMETH/SULFA <=10 SENSITIVE Sensitive     CLINDAMYCIN <=0.25 SENSITIVE Sensitive     RIFAMPIN <=0.5 SENSITIVE Sensitive     Inducible Clindamycin NEGATIVE Sensitive     * STAPHYLOCOCCUS AUREUS  Blood Culture ID Panel (Reflexed)     Status: Abnormal   Collection Time: 2020-10-23  1:00 AM  Result Value Ref Range Status   Enterococcus faecalis NOT DETECTED NOT DETECTED Final   Enterococcus Faecium NOT DETECTED NOT DETECTED Final   Listeria monocytogenes NOT DETECTED NOT DETECTED Final   Staphylococcus species DETECTED (A) NOT DETECTED Final    Comment: CRITICAL RESULT CALLED TO, READ BACK BY AND VERIFIED WITH: E MARTEN,PHARMD AT 1717 October 23, 2020 BY L BENFIELD    Staphylococcus aureus (BCID) DETECTED (A) NOT DETECTED Final    Comment: CRITICAL RESULT CALLED TO, READ BACK BY AND VERIFIED WITH: E MARTEN,PHARMD AT 1717 10/23/20 BY L BENFIELD    Staphylococcus epidermidis NOT DETECTED NOT DETECTED Final   Staphylococcus lugdunensis NOT DETECTED NOT DETECTED Final   Streptococcus species NOT DETECTED NOT DETECTED Final   Streptococcus agalactiae NOT DETECTED NOT DETECTED Final   Streptococcus pneumoniae NOT DETECTED NOT DETECTED Final   Streptococcus pyogenes NOT DETECTED NOT DETECTED Final   A.calcoaceticus-baumannii NOT  DETECTED NOT DETECTED Final   Bacteroides fragilis NOT DETECTED NOT DETECTED Final   Enterobacterales NOT DETECTED NOT DETECTED Final   Enterobacter cloacae complex NOT DETECTED NOT DETECTED Final   Escherichia coli NOT DETECTED NOT DETECTED Final   Klebsiella aerogenes NOT DETECTED NOT DETECTED Final   Klebsiella oxytoca NOT DETECTED NOT DETECTED Final   Klebsiella pneumoniae NOT DETECTED NOT DETECTED Final   Proteus species NOT DETECTED NOT DETECTED Final   Salmonella species NOT DETECTED NOT DETECTED Final   Serratia marcescens NOT DETECTED NOT DETECTED Final   Haemophilus influenzae NOT DETECTED NOT DETECTED Final   Neisseria meningitidis NOT DETECTED NOT DETECTED Final   Pseudomonas aeruginosa NOT DETECTED NOT DETECTED Final   Stenotrophomonas maltophilia NOT DETECTED NOT DETECTED Final   Candida albicans NOT DETECTED NOT DETECTED Final   Candida auris NOT DETECTED NOT DETECTED Final   Candida glabrata NOT DETECTED NOT DETECTED Final   Candida krusei NOT DETECTED NOT DETECTED Final   Candida parapsilosis NOT DETECTED NOT DETECTED Final   Candida tropicalis NOT DETECTED NOT DETECTED Final   Cryptococcus neoformans/gattii NOT DETECTED NOT DETECTED Final   Meth resistant mecA/C and MREJ NOT DETECTED NOT DETECTED Final    Comment: Performed at [facility], [address] [place], [place] [address]  Resp Panel by RT-PCR (Flu A&B, Covid) Nasopharyngeal Swab     Status: None   Collection Time: 23-Oct-2020  2:46 AM   Specimen: Nasopharyngeal Swab; Nasopharyngeal(NP) swabs in vial transport medium  Result Value Ref Range Status   SARS Coronavirus 2 by RT PCR NEGATIVE NEGATIVE Final    Comment: (NOTE) SARS-CoV-2 target nucleic acids are NOT DETECTED.  The  SARS-CoV-2 RNA is generally detectable in upper respiratory specimens during the acute phase of infection. The lowest concentration of SARS-CoV-2 viral copies this assay can detect is 138 copies/mL. A negative result does not  preclude SARS-Cov-2 infection and should not be used as the sole basis for treatment or other patient management decisions. A negative result may occur with  improper specimen collection/handling, submission of specimen other than nasopharyngeal swab, presence of viral mutation(s) within the areas targeted by this assay, and inadequate number of viral copies(<138 copies/mL). A negative result must be combined with clinical observations, patient history, and epidemiological information. The expected result is Negative.  Fact Sheet for Patients:  BloggerCourse.com  Fact Sheet for Healthcare Providers:  SeriousBroker.it  This test is no t yet approved or cleared by the [place] FDA and  has been authorized for detection and/or diagnosis of SARS-CoV-2 by FDA under an Emergency Use Authorization (EUA). This EUA will remain  in effect (meaning this test can be used) for the duration of the COVID-19 declaration under Section 564(b)(1) of the Act, 21 U.S.C.section 360bbb-3(b)(1), unless the authorization is terminated  or revoked sooner.       Influenza A by PCR NEGATIVE NEGATIVE Final   Influenza B by PCR NEGATIVE NEGATIVE Final    Comment: (NOTE) The Xpert Xpress SARS-CoV-2/FLU/RSV plus assay is intended as an aid in the diagnosis of influenza from Nasopharyngeal swab specimens and should not be used as a sole basis for treatment. Nasal washings and aspirates are unacceptable for Xpert Xpress SARS-CoV-2/FLU/RSV testing.  Fact Sheet for Patients: BloggerCourse.com  Fact Sheet for Healthcare Providers: SeriousBroker.it  This test is not yet approved or cleared by the [place] FDA and has been authorized for detection and/or diagnosis of SARS-CoV-2 by FDA under an Emergency Use Authorization (EUA). This EUA will remain in effect (meaning this test can be used) for the  duration of the COVID-19 declaration under Section 564(b)(1) of the Act, 21 U.S.C. section 360bbb-3(b)(1), unless the authorization is terminated or revoked.  Performed at [facility], [address], [place], [place] [address]   Urine culture     Status: Abnormal   Collection Time: 10-26-20  2:48 AM   Specimen: In/Out Cath Urine  Result Value Ref Range Status   Specimen Description IN/OUT CATH URINE  Final   Special Requests   Final    NONE Performed at [facility], [address] [place], [place] [address]    Culture >=100,000 COLONIES/mL STAPHYLOCOCCUS AUREUS (A)  Final   Report Status 10/23/2020 FINAL  Final   Organism ID, Bacteria STAPHYLOCOCCUS AUREUS (A)  Final      Susceptibility   Staphylococcus aureus - MIC*    CIPROFLOXACIN >=8 RESISTANT Resistant     GENTAMICIN <=0.5 SENSITIVE Sensitive     NITROFURANTOIN 32 SENSITIVE Sensitive     OXACILLIN 0.5 SENSITIVE Sensitive     TETRACYCLINE <=1 SENSITIVE Sensitive     VANCOMYCIN <=0.5 SENSITIVE Sensitive     TRIMETH/SULFA <=10 SENSITIVE Sensitive     CLINDAMYCIN <=0.25 SENSITIVE Sensitive     RIFAMPIN <=0.5 SENSITIVE Sensitive     Inducible Clindamycin NEGATIVE Sensitive     * >=100,000 COLONIES/mL STAPHYLOCOCCUS AUREUS  Aerobic/Anaerobic Culture (surgical/deep wound)     Status: None (Preliminary result)   Collection Time: 10-26-2020  1:47 PM   Specimen: Abscess  Result Value Ref Range Status   Specimen Description ABSCESS  Final   Special Requests LEFT THIGH/HIP ABSC  Final  Gram Stain   Final    FEW WBC PRESENT, PREDOMINANTLY PMN MODERATE GRAM POSITIVE COCCI IN PAIRS IN CLUSTERS Performed at [facility], [address] [place], [place] [address]    Culture   Final    ABUNDANT STAPHYLOCOCCUS AUREUS NO ANAEROBES ISOLATED; CULTURE IN PROGRESS FOR 5 DAYS    Report Status PENDING  Incomplete   Organism ID, Bacteria STAPHYLOCOCCUS AUREUS  Final      Susceptibility   Staphylococcus aureus - MIC*     CIPROFLOXACIN >=8 RESISTANT Resistant     ERYTHROMYCIN <=0.25 SENSITIVE Sensitive     GENTAMICIN <=0.5 SENSITIVE Sensitive     OXACILLIN 0.5 SENSITIVE Sensitive     TETRACYCLINE <=1 SENSITIVE Sensitive     VANCOMYCIN <=0.5 SENSITIVE Sensitive     TRIMETH/SULFA <=10 SENSITIVE Sensitive     CLINDAMYCIN <=0.25 SENSITIVE Sensitive     RIFAMPIN <=0.5 SENSITIVE Sensitive     Inducible Clindamycin NEGATIVE Sensitive     * ABUNDANT STAPHYLOCOCCUS AUREUS  MRSA PCR Screening     Status: None   Collection Time: 2020-09-23  3:12 PM   Specimen: Nasopharyngeal  Result Value Ref Range Status   MRSA by PCR NEGATIVE NEGATIVE Final    Comment:        The GeneXpert MRSA Assay (FDA approved for NASAL specimens only), is one component of a comprehensive MRSA colonization surveillance program. It is not intended to diagnose MRSA infection nor to guide or monitor treatment for MRSA infections. Performed at [facility], [address] [place], [place] [address]   Culture, blood (routine x 2)     Status: None (Preliminary result)   Collection Time: 10/22/20 11:40 AM   Specimen: BLOOD LEFT ARM  Result Value Ref Range Status   Specimen Description BLOOD LEFT ARM  Final   Special Requests   Final    BOTTLES DRAWN AEROBIC AND ANAEROBIC Blood Culture adequate volume   Culture   Final    NO GROWTH 2 DAYS Performed at [facility], [address] [place], [place] [address]    Report Status PENDING  Incomplete  Culture, blood (routine x 2)     Status: None (Preliminary result)   Collection Time: 10/22/20 11:55 AM   Specimen: BLOOD LEFT HAND  Result Value Ref Range Status   Specimen Description BLOOD LEFT HAND  Final   Special Requests   Final    BOTTLES DRAWN AEROBIC AND ANAEROBIC Blood Culture adequate volume   Culture   Final    NO GROWTH 2 DAYS Performed at [facility], [address] [place], [place] [address]    Report Status PENDING  Incomplete     Studies/Results: ECHOCARDIOGRAM COMPLETE  Result Date: 10/23/2020    ECHOCARDIOGRAM REPORT   Patient Name:   Denton LankSamuel J Lindblad Jr. Date of Exam: 10/23/2020 Medical Rec #:  [id number]         Height:       72.0 in Accession #:    [id number]        Weight:       155.2 lb Date of Birth:  10/07/1941          BSA:          1.913 m Patient Age:    79 years          BP:           135/78 mmHg Patient Gender: M  HR:           136 bpm. Exam Location:  Inpatient Procedure: 2D Echo, Cardiac Doppler and Color Doppler Indications:    Bacteremia.  History:        Patient has no prior history of Echocardiogram examinations.                 Abnormal ECG and Ventricular bigeminy; Risk                 Factors:Hypertension, Diabetes, Dyslipidemia and Former Smoker.  Sonographer:    Sheralyn Boatman RDCS Referring Phys: [id number] BRADLEY L ICARD IMPRESSIONS  1. Very difficult study due to intermittent tachycardia and intermittent ventricular bigeminy. EF appears low normal 50-55%. Left ventricular ejection fraction, by estimation, is 50 to 55%. The left ventricle has low normal function. The left ventricle has no regional wall motion abnormalities. Left ventricular diastolic function could not be evaluated.  2. Right ventricular systolic function is normal. The right ventricular size is normal. Tricuspid regurgitation signal is inadequate for assessing PA pressure.  3. The mitral valve is grossly normal. No evidence of mitral valve regurgitation. No evidence of mitral stenosis.  4. The NCC contains focal calcification. There is no regurgitation to suggest this is vegetation. The aortic valve is tricuspid. There is mild calcification of the aortic valve. Aortic valve regurgitation is not visualized. Mild aortic valve sclerosis is present, with no evidence of aortic valve stenosis.  5. The inferior vena cava is normal in size with greater than 50% respiratory variability, suggesting right atrial pressure of 3 mmHg.  Conclusion(s)/Recommendation(s): No evidence of valvular vegetations on this transthoracic echocardiogram. Would recommend a transesophageal echocardiogram to exclude infective endocarditis if clinically indicated. FINDINGS  Left Ventricle: Very difficult study due to intermittent tachycardia and intermittent ventricular bigeminy. EF appears low normal 50-55%. Left ventricular ejection fraction, by estimation, is 50 to 55%. The left ventricle has low normal function. The left ventricle has no regional wall motion abnormalities. The left ventricular internal cavity size was normal in size. There is no left ventricular hypertrophy. Left ventricular diastolic function could not be evaluated due to nondiagnostic images. Left  ventricular diastolic function could not be evaluated. Right Ventricle: The right ventricular size is normal. No increase in right ventricular wall thickness. Right ventricular systolic function is normal. Tricuspid regurgitation signal is inadequate for assessing PA pressure. Left Atrium: Left atrial size was normal in size. Right Atrium: Right atrial size was normal in size. Pericardium: There is no evidence of pericardial effusion. Mitral Valve: The mitral valve is grossly normal. No evidence of mitral valve regurgitation. No evidence of mitral valve stenosis. Tricuspid Valve: The tricuspid valve is grossly normal. Tricuspid valve regurgitation is trivial. No evidence of tricuspid stenosis. Aortic Valve: The NCC contains focal calcification. There is no regurgitation to suggest this is vegetation. The aortic valve is tricuspid. There is mild calcification of the aortic valve. Aortic valve regurgitation is not visualized. Mild aortic valve sclerosis is present, with no evidence of aortic valve stenosis. Pulmonic Valve: The pulmonic valve was grossly normal. Pulmonic valve regurgitation is not visualized. No evidence of pulmonic stenosis. Aorta: The aortic root and ascending aorta are structurally  normal, with no evidence of dilitation. Venous: The inferior vena cava is normal in size with greater than 50% respiratory variability, suggesting right atrial pressure of 3 mmHg. IAS/Shunts: The atrial septum is grossly normal. Additional Comments: There is a small pleural effusion in the left lateral region.  LEFT VENTRICLE PLAX  2D LVIDd:         4.50 cm LVIDs:         3.20 cm LV PW:         1.30 cm LV IVS:        0.90 cm LVOT diam:     2.00 cm LV SV:         57 LV SV Index:   30 LVOT Area:     3.14 cm  LV Volumes (MOD) LV vol d, MOD A2C: 42.9 ml LV vol d, MOD A4C: 62.1 ml LV vol s, MOD A2C: 27.5 ml LV vol s, MOD A4C: 24.8 ml LV SV MOD A2C:     15.4 ml LV SV MOD A4C:     62.1 ml LV SV MOD BP:      25.9 ml RIGHT VENTRICLE            IVC RV S prime:     9.32 cm/s  IVC diam: 1.80 cm TAPSE (M-mode): 1.4 cm LEFT ATRIUM             Index       RIGHT ATRIUM           Index LA diam:        2.50 cm 1.31 cm/m  RA Area:     11.40 cm LA Vol (A2C):   16.2 ml 8.47 ml/m  RA Volume:   25.00 ml  13.07 ml/m LA Vol (A4C):   20.0 ml 10.46 ml/m LA Biplane Vol: 18.8 ml 9.83 ml/m  AORTIC VALVE LVOT Vmax:   99.20 cm/s LVOT Vmean:  70.000 cm/s LVOT VTI:    0.183 m  AORTA Ao Root diam: 3.30 cm Ao Asc diam:  3.00 cm MITRAL VALVE MV Area (PHT): 4.15 cm    SHUNTS MV Decel Time: 183 msec    Systemic VTI:  0.18 m MV E velocity: 67.50 cm/s  Systemic Diam: 2.00 cm MV A velocity: 88.70 cm/s MV E/A ratio:  0.76 Lennie Odor MD Electronically signed by Lennie Odor MD Signature Date/Time: 10/23/2020/11:56:59 AM    Final       Assessment/Plan:  INTERVAL HISTORY: Culture sent   Principal Problem:   MSSA bacteremia Active Problems:   Protein-calorie malnutrition, severe (HCC)   Severe sepsis (HCC)   Abscess of left hip   Hypernatremia    Laddie Math. is a 79 y.o. male with with MSSA bacteremia and infected left total hip arthroplasty status post IR guided aspirate of fluid is of course also growing staph aureus repeat  blood cultures been taken  Orthopedics do not feel that he is a good operative candidate at this point in time and that he might in fact require a [place] for control of the infection operatively  2D echocardiogram has been done and mentions some ossifications involving the aortic valve and I believe the mitral valve.  There is not an obvious vegetation.  Repeat blood cultures been taken.  Continue cefazolin for now I do not think he has clear-cut evidence of embolization to his central nervous system   I will get MRI of the brain to try to further elucidate that question  We will plan on adding rifampin once his blood cultures clear.     LOS: 3 days   Acey Lav 10/24/2020, 10:37 AM

## 2020-10-24 NOTE — Progress Notes (Signed)
[facility] PROGRESS NOTE   Marcus Lank. 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 Luiz Blare 07/2020. He was sent to the ED from his nursing home for worsening altered mental status, agitation, and findings of AKI with electrolyte abnormalities.  CT A/P revealed prominent fluid collection of the left hip, lateral to arthroplasty hardware. [place] was performed of the area, revealed complex fluid collection.  Reason for Visit: Sepsis secondary to MSSA  Consultants: Orthopedics.  Interventional radiology.  Critical care medicine.  Procedures: 12/4: Left hip fluid collection drainage by IR  Antibiotics: Anti-infectives (From admission, onward)   Start     Dose/Rate Route Frequency Ordered Stop   10/24/20 0815  ceFAZolin (ANCEF) IVPB 2g/100 mL premix        2 g 200 mL/hr over 30 Minutes Intravenous Every 8 hours 10/24/20 0804     10/23/20 2200  ceFAZolin (ANCEF) IVPB 2g/100 mL premix  Status:  Discontinued        2 g 200 mL/hr over 30 Minutes Intravenous Every 12 hours 10/23/20 1406 10/24/20 0804   10/22/20 1000  ceFAZolin (ANCEF) IVPB 1 g/50 mL premix  Status:  Discontinued        1 g 100 mL/hr over 30 Minutes Intravenous Every 12 hours 11/04/2020 1745 10/23/20 1406   11/12/2020 2200  ceFAZolin (ANCEF) IVPB 2g/100 mL premix        2 g 200 mL/hr over 30 Minutes Intravenous  Once 10/24/2020 1739 11/01/2020 2239   11/05/2020 2100  cefTRIAXone (ROCEPHIN) 2 g in sodium chloride 0.9 % 100 mL IVPB  Status:  Discontinued        2 g 200 mL/hr over 30 Minutes Intravenous Every 24 hours 10/28/2020 1117 10/22/2020 1129   11/01/2020 1730  linezolid (ZYVOX) IVPB  600 mg  Status:  Discontinued        600 mg 300 mL/hr over 60 Minutes Intravenous Every 12 hours 11/07/2020 1154 11/04/2020 1739   10/31/2020 1130  cefTRIAXone (ROCEPHIN) 2 g in sodium chloride 0.9 % 100 mL IVPB  Status:  Discontinued        2 g 200 mL/hr over 30 Minutes Intravenous Every 24 hours 11/16/2020 1129 10/24/2020 1739   11/05/2020 1000  meropenem (MERREM) 500 mg in sodium chloride 0.9 % 100 mL IVPB  Status:  Discontinued        500 mg 200 mL/hr over 30 Minutes Intravenous Every 12 hours 10/20/2020 0330 11/13/2020 1117   11/03/2020 0400  linezolid (ZYVOX) IVPB 600 mg        600 mg 300 mL/hr over 60 Minutes Intravenous  Once 10/30/2020 0309 11/01/2020 0654   10/25/2020 0215  ceFEPIme (MAXIPIME) 2 g in sodium chloride 0.9 % 100 mL IVPB        2 g 200 mL/hr over 30 Minutes Intravenous  Once 11/09/2020 0212 10/22/2020 0306   10/26/2020 0215  ampicillin-sulbactam (UNASYN) 1.5 g in sodium chloride 0.9 % 100 mL IVPB  Status:  Discontinued        1.5 g 200 mL/hr over 30 Minutes Intravenous  Once 11/12/2020 0212 10/29/2020 0214   11/06/2020 0215  vancomycin (VANCOCIN) IVPB 1000 mg/200 mL premix  Status:  Discontinued  1,000 mg 200 mL/hr over 60 Minutes Intravenous  Once 10/28/2020 6734 10/23/2020 0314      Subjective/Interval History: Patient noted to be confused.  Unable to provide any history.     Assessment/Plan:  MSSA bacteremia/sepsis secondary to MSSA/left hip abscess/septic shock Patient was in the intensive care unit due to septic shock.  He required pressors.  Has been weaned off of pressors.  Cultures are growing MSSA.  Infectious disease is following.  Patient currently on cefazolin.  Echocardiogram does not show any vegetation.  Repeat blood cultures have been done.  Patient not a good candidate for operative intervention of the left hip.  Interventional radiology was consulted and abscess has been drained. Further management per ID.  Orthopedics continues to follow.  SVT Heart rate remains elevated  greater than 120 with PVCs.  Patient on as needed IV metoprolol.  Also on scheduled metoprolol down the NG tube.  TSH noted to be normal at 1.6.  Acute metabolic encephalopathy in the setting of history of stroke and vascular dementia Patient's baseline mentation not entirely clear.  Discussed with her son who mentions that over the past 3 months patient has had a decline in his mentation.  Continue to reorient.  Acute illness is likely contributing to his altered mental status.  No focal deficits noted.  Acute kidney injury/hypernatremia Likely secondary to sepsis and prerenal acidemia.  Improving gradually.  Patient also noted to have hypernatremia.  Potassium is normal.  Microcytic anemia History of iron deficiency is noted.  Hemoglobin more or less at baseline.  No evidence of overt bleeding.  Transaminitis Thought to be due to septic shock.  Although AST not noted to be significantly elevated.  We will recheck tomorrow.  Had been trending down.  Diabetes mellitus type 2, uncontrolled with hyperglycemia HbA1c 8.1 when last checked.  Monitor CBGs.  May need further titration of his insulin.  Currently noted to be on Lantus 10 units twice a day and SSI.  History of dysphagia Currently on tube feedings via NG tube.  Will eventually need to have speech therapy evaluate him.  Goals of care Long discussion with patient's son regarding patient's trajectory over the last several months.  He was told about his severity of illness.  Also told about the fact that patient has a history of vascular dementia which will not get any better.  Despite all of this patient's wants to continue full scope of care for now.  Pressure injury Pressure Injury 08/07/20 Buttocks Right;Medial;Left Stage 2 -  Partial thickness loss of dermis presenting as a shallow open injury with a red, pink wound bed without slough. (Active)  08/07/20 1914  Location: Buttocks  Location Orientation: Right;Medial;Left  Staging:  Stage 2 -  Partial thickness loss of dermis presenting as a shallow open injury with a red, pink wound bed without slough.  Wound Description (Comments):   Present on Admission: Yes    Severe malnutrition Nutrition Problem: Severe Malnutrition Etiology: chronic illness (dementia)  Signs/Symptoms: severe fat depletion, severe muscle depletion, percent weight loss (15.4% weight loss in 9 months) Percent weight loss: 15.4 % (less than 9 months)  Interventions: Tube feeding, Prostat, MVI   DVT Prophylaxis: Subcutaneous heparin Code Status: Full code.  This was discussed with patient's son in detail. Family Communication: Discussed with patient's son Disposition Plan: Return to SNF when improved  Status is: Inpatient  Remains inpatient appropriate because:Altered mental status, IV treatments appropriate due to intensity of illness or inability to take PO  and Inpatient level of care appropriate due to severity of illness   Dispo:  Patient From: Skilled Nursing Facility  Planned Disposition: Skilled Nursing Facility  Expected discharge date: 10/24/20  Medically stable for discharge: No      Medications:  Scheduled: . brimonidine  1 drop Both Eyes TID  . Chlorhexidine Gluconate Cloth  6 each Topical Daily  . feeding supplement (PROSource TF)  45 mL Per Tube Daily  . free water  250 mL Per Tube Q4H  . heparin injection (subcutaneous)  5,000 Units Subcutaneous Q8H  . insulin aspart  0-15 Units Subcutaneous Q4H  . insulin aspart  5 Units Subcutaneous Q4H  . insulin glargine  10 Units Subcutaneous BID  . latanoprost  1 drop Both Eyes QHS  . metoprolol tartrate  12.5 mg Per Tube Q8H  . pantoprazole (PROTONIX) IV  40 mg Intravenous QHS  . sodium chloride flush  5 mL Intracatheter Q8H   Continuous: . sodium chloride 10 mL/hr at 10/24/20 0800  .  ceFAZolin (ANCEF) IV 2 g (10/24/20 0901)  . feeding supplement (OSMOLITE 1.2 CAL) 1,000 mL (10/24/20 0711)   GYI:RSWNIO chloride,  acetaminophen, docusate, HYDROmorphone (DILAUDID) injection, oxyCODONE, polyethylene glycol   Objective:  Vital Signs  Vitals:   10/24/20 0700 10/24/20 0800 10/24/20 0900 10/24/20 0907  BP: 134/67 139/68 (!) 145/74 135/71  Pulse:  (!) 128 (!) 126 (!) 127  Resp:  (!) 25    Temp: 97.8 F (36.6 C)     TempSrc: Axillary     SpO2:  100%    Weight:      Height:        Intake/Output Summary (Last 24 hours) at 10/24/2020 1148 Last data filed at 10/24/2020 0936 Gross per 24 hour  Intake 1405.22 ml  Output 1185 ml  Net 220.22 ml   Filed Weights   10/22/20 0530 10/23/20 0700 10/24/20 0500  Weight: 68.8 kg 70.4 kg 70 kg    General appearance: He is confused.  Not responding to questions. Resp: Noted to be mildly tachypneic.  Diminished air entry at the bases with few crackles bilaterally.  No wheezing or rhonchi. Cardio: S1-S2 is tachycardic regular.  No S3-S4.  Colic murmur appreciated over the precordium GI: Abdomen is soft.  Nontender nondistended.  Bowel sounds are present normal.  No masses organomegaly Extremities: No edema.   Neurologic: No obvious focal deficits noted.   Lab Results:  Data Reviewed: I have personally reviewed following labs and imaging studies  CBC: Recent Labs  Lab 2020/11/03 0100 Nov 03, 2020 0100 Nov 03, 2020 0404 10/22/20 0032 10/22/20 0512 10/23/20 0454 10/24/20 0132  WBC 13.2*  --   --   --  9.9 8.0 7.1  NEUTROABS 11.7*  --   --   --  8.1* 5.8 5.5  HGB 12.4*   < > 9.5* 10.6* 10.5* 10.1* 10.7*  HCT 37.1*   < > 28.0* 31.3* 29.9* 29.1* 30.6*  MCV 78.1*  --   --   --  74.9* 75.4* 74.8*  PLT 268  --   --   --  210 165 166   < > = values in this interval not displayed.    Basic Metabolic Panel: Recent Labs  Lab 03-Nov-2020 2057 10/22/20 0032 10/22/20 0512 10/22/20 0933 10/22/20 1841 10/23/20 0454 10/23/20 1807 10/24/20 0132  NA 146* 145 146*  --   --  146*  --  148*  K 6.0* 5.7* 4.9  --   --  3.8  --  3.8  CL 115* 115* 115*  --   --  117*  --   116*  CO2 18* 18* 20*  --   --  17*  --  22  GLUCOSE 305* 257* 202*  --   --  342*  --  372*  BUN 74* 77* 75*  --   --  75*  --  64*  CREATININE 3.28* 3.39* 3.16*  --   --  2.07*  --  1.80*  CALCIUM 8.0* 7.9* 8.0*  --   --  8.0*  --  8.1*  MG  --   --  2.4 2.4 2.6* 2.6* 2.5*  --   PHOS  --   --  4.5 4.5 4.1 3.5 3.0  --     GFR: Estimated Creatinine Clearance: 33.5 mL/min (A) (by C-G formula based on SCr of 1.8 mg/dL (H)).  Liver Function Tests: Recent Labs  Lab 10/27/2020 0100 10/22/20 0512  AST 123* 70*  ALT 58* 39  ALKPHOS 203* 138*  BILITOT 1.6* 0.5  PROT 8.1 6.4*  ALBUMIN 1.5* 1.2*     Recent Labs  Lab 11/07/2020 0722  AMMONIA <9*    Coagulation Profile: Recent Labs  Lab 10/28/2020 0100  INR 1.4*    CBG: Recent Labs  Lab 10/23/20 1909 10/23/20 2322 10/24/20 0308 10/24/20 0715 10/24/20 1121  GLUCAP 309* 310* 303* 266* 256*      Recent Results (from the past 240 hour(s))  Culture, blood (Routine x 2)     Status: Abnormal   Collection Time: 10/19/2020  1:00 AM   Specimen: BLOOD  Result Value Ref Range Status   Specimen Description BLOOD SITE NOT SPECIFIED  Final   Special Requests   Final    BOTTLES DRAWN AEROBIC AND ANAEROBIC Blood Culture adequate volume   Culture  Setup Time   Final    GRAM POSITIVE COCCI IN CLUSTERS IN BOTH AEROBIC AND ANAEROBIC BOTTLES CRITICAL VALUE NOTED.  VALUE IS CONSISTENT WITH PREVIOUSLY REPORTED AND CALLED VALUE.    Culture (A)  Final    STAPHYLOCOCCUS AUREUS SUSCEPTIBILITIES PERFORMED ON PREVIOUS CULTURE WITHIN THE LAST 5 DAYS. Performed at [facility], [address] [place], [place] [address]    Report Status 10/23/2020 FINAL  Final  Culture, blood (Routine x 2)     Status: Abnormal   Collection Time: 11/02/2020  1:00 AM   Specimen: BLOOD  Result Value Ref Range Status   Specimen Description BLOOD SITE NOT SPECIFIED  Final   Special Requests   Final    BOTTLES DRAWN AEROBIC AND ANAEROBIC Blood Culture results  may not be optimal due to an inadequate volume of blood received in culture bottles   Culture  Setup Time   Final    GRAM POSITIVE COCCI IN CLUSTERS IN BOTH AEROBIC AND ANAEROBIC BOTTLES CRITICAL RESULT CALLED TO, READ BACK BY AND VERIFIED WITH: E MARTEN,PHARMD AT 1717 11/04/2020 BY L BENFIELD Performed at [facility], [address], [place], [place] [address]    Culture STAPHYLOCOCCUS AUREUS (A)  Final   Report Status 10/23/2020 FINAL  Final   Organism ID, Bacteria STAPHYLOCOCCUS AUREUS  Final      Susceptibility   Staphylococcus aureus - MIC*    CIPROFLOXACIN >=8 RESISTANT Resistant     ERYTHROMYCIN <=0.25 SENSITIVE Sensitive     GENTAMICIN <=0.5 SENSITIVE Sensitive     OXACILLIN 0.5 SENSITIVE Sensitive     TETRACYCLINE <=1 SENSITIVE Sensitive     VANCOMYCIN <=0.5 SENSITIVE Sensitive  TRIMETH/SULFA <=10 SENSITIVE Sensitive     CLINDAMYCIN <=0.25 SENSITIVE Sensitive     RIFAMPIN <=0.5 SENSITIVE Sensitive     Inducible Clindamycin NEGATIVE Sensitive     * STAPHYLOCOCCUS AUREUS  Blood Culture ID Panel (Reflexed)     Status: Abnormal   Collection Time: 11-01-20  1:00 AM  Result Value Ref Range Status   Enterococcus faecalis NOT DETECTED NOT DETECTED Final   Enterococcus Faecium NOT DETECTED NOT DETECTED Final   Listeria monocytogenes NOT DETECTED NOT DETECTED Final   Staphylococcus species DETECTED (A) NOT DETECTED Final    Comment: CRITICAL RESULT CALLED TO, READ BACK BY AND VERIFIED WITH: E MARTEN,PHARMD AT 1717 2020/11/01 BY L BENFIELD    Staphylococcus aureus (BCID) DETECTED (A) NOT DETECTED Final    Comment: CRITICAL RESULT CALLED TO, READ BACK BY AND VERIFIED WITH: E MARTEN,PHARMD AT 1717 2020-11-01 BY L BENFIELD    Staphylococcus epidermidis NOT DETECTED NOT DETECTED Final   Staphylococcus lugdunensis NOT DETECTED NOT DETECTED Final   Streptococcus species NOT DETECTED NOT DETECTED Final   Streptococcus agalactiae NOT DETECTED NOT DETECTED Final   Streptococcus  pneumoniae NOT DETECTED NOT DETECTED Final   Streptococcus pyogenes NOT DETECTED NOT DETECTED Final   A.calcoaceticus-baumannii NOT DETECTED NOT DETECTED Final   Bacteroides fragilis NOT DETECTED NOT DETECTED Final   Enterobacterales NOT DETECTED NOT DETECTED Final   Enterobacter cloacae complex NOT DETECTED NOT DETECTED Final   Escherichia coli NOT DETECTED NOT DETECTED Final   Klebsiella aerogenes NOT DETECTED NOT DETECTED Final   Klebsiella oxytoca NOT DETECTED NOT DETECTED Final   Klebsiella pneumoniae NOT DETECTED NOT DETECTED Final   Proteus species NOT DETECTED NOT DETECTED Final   Salmonella species NOT DETECTED NOT DETECTED Final   Serratia marcescens NOT DETECTED NOT DETECTED Final   Haemophilus influenzae NOT DETECTED NOT DETECTED Final   Neisseria meningitidis NOT DETECTED NOT DETECTED Final   Pseudomonas aeruginosa NOT DETECTED NOT DETECTED Final   Stenotrophomonas maltophilia NOT DETECTED NOT DETECTED Final   Candida albicans NOT DETECTED NOT DETECTED Final   Candida auris NOT DETECTED NOT DETECTED Final   Candida glabrata NOT DETECTED NOT DETECTED Final   Candida krusei NOT DETECTED NOT DETECTED Final   Candida parapsilosis NOT DETECTED NOT DETECTED Final   Candida tropicalis NOT DETECTED NOT DETECTED Final   Cryptococcus neoformans/gattii NOT DETECTED NOT DETECTED Final   Meth resistant mecA/C and MREJ NOT DETECTED NOT DETECTED Final    Comment: Performed at [facility], [address] [place], [place] [address]  Resp Panel by RT-PCR (Flu A&B, Covid) Nasopharyngeal Swab     Status: None   Collection Time: 11-01-20  2:46 AM   Specimen: Nasopharyngeal Swab; Nasopharyngeal(NP) swabs in vial transport medium  Result Value Ref Range Status   SARS Coronavirus 2 by RT PCR NEGATIVE NEGATIVE Final    Comment: (NOTE) SARS-CoV-2 target nucleic acids are NOT DETECTED.  The SARS-CoV-2 RNA is generally detectable in upper respiratory specimens during the acute phase of  infection. The lowest concentration of SARS-CoV-2 viral copies this assay can detect is 138 copies/mL. A negative result does not preclude SARS-Cov-2 infection and should not be used as the sole basis for treatment or other patient management decisions. A negative result may occur with  improper specimen collection/handling, submission of specimen other than nasopharyngeal swab, presence of viral mutation(s) within the areas targeted by this assay, and inadequate number of viral copies(<138 copies/mL). A negative result must be combined with clinical observations, patient history,  and epidemiological information. The expected result is Negative.  Fact Sheet for Patients:  BloggerCourse.com  Fact Sheet for Healthcare Providers:  SeriousBroker.it  This test is no t yet approved or cleared by the [place] FDA and  has been authorized for detection and/or diagnosis of SARS-CoV-2 by FDA under an Emergency Use Authorization (EUA). This EUA will remain  in effect (meaning this test can be used) for the duration of the COVID-19 declaration under Section 564(b)(1) of the Act, 21 U.S.C.section 360bbb-3(b)(1), unless the authorization is terminated  or revoked sooner.       Influenza A by PCR NEGATIVE NEGATIVE Final   Influenza B by PCR NEGATIVE NEGATIVE Final    Comment: (NOTE) The Xpert Xpress SARS-CoV-2/FLU/RSV plus assay is intended as an aid in the diagnosis of influenza from Nasopharyngeal swab specimens and should not be used as a sole basis for treatment. Nasal washings and aspirates are unacceptable for Xpert Xpress SARS-CoV-2/FLU/RSV testing.  Fact Sheet for Patients: BloggerCourse.com  Fact Sheet for Healthcare Providers: SeriousBroker.it  This test is not yet approved or cleared by the [place] FDA and has been authorized for detection and/or diagnosis of SARS-CoV-2  by FDA under an Emergency Use Authorization (EUA). This EUA will remain in effect (meaning this test can be used) for the duration of the COVID-19 declaration under Section 564(b)(1) of the Act, 21 U.S.C. section 360bbb-3(b)(1), unless the authorization is terminated or revoked.  Performed at [facility], [address], [place], [place] [address]   Urine culture     Status: Abnormal   Collection Time: 11/10/2020  2:48 AM   Specimen: In/Out Cath Urine  Result Value Ref Range Status   Specimen Description IN/OUT CATH URINE  Final   Special Requests   Final    NONE Performed at [facility], [address], [place], [place] [address]    Culture >=100,000 COLONIES/mL STAPHYLOCOCCUS AUREUS (A)  Final   Report Status 10/23/2020 FINAL  Final   Organism ID, Bacteria STAPHYLOCOCCUS AUREUS (A)  Final      Susceptibility   Staphylococcus aureus - MIC*    CIPROFLOXACIN >=8 RESISTANT Resistant     GENTAMICIN <=0.5 SENSITIVE Sensitive     NITROFURANTOIN 32 SENSITIVE Sensitive     OXACILLIN 0.5 SENSITIVE Sensitive     TETRACYCLINE <=1 SENSITIVE Sensitive     VANCOMYCIN <=0.5 SENSITIVE Sensitive     TRIMETH/SULFA <=10 SENSITIVE Sensitive     CLINDAMYCIN <=0.25 SENSITIVE Sensitive     RIFAMPIN <=0.5 SENSITIVE Sensitive     Inducible Clindamycin NEGATIVE Sensitive     * >=100,000 COLONIES/mL STAPHYLOCOCCUS AUREUS  Aerobic/Anaerobic Culture (surgical/deep wound)     Status: None (Preliminary result)   Collection Time: 10/27/2020  1:47 PM   Specimen: Abscess  Result Value Ref Range Status   Specimen Description ABSCESS  Final   Special Requests LEFT THIGH/HIP ABSC  Final   Gram Stain   Final    FEW WBC PRESENT, PREDOMINANTLY PMN MODERATE GRAM POSITIVE COCCI IN PAIRS IN CLUSTERS Performed at [facility], [address] [place], [place] [address]    Culture   Final    ABUNDANT STAPHYLOCOCCUS AUREUS NO ANAEROBES ISOLATED; CULTURE IN PROGRESS FOR 5 DAYS    Report Status  PENDING  Incomplete   Organism ID, Bacteria STAPHYLOCOCCUS AUREUS  Final      Susceptibility   Staphylococcus aureus - MIC*    CIPROFLOXACIN >=8 RESISTANT Resistant     ERYTHROMYCIN <=0.25  SENSITIVE Sensitive     GENTAMICIN <=0.5 SENSITIVE Sensitive     OXACILLIN 0.5 SENSITIVE Sensitive     TETRACYCLINE <=1 SENSITIVE Sensitive     VANCOMYCIN <=0.5 SENSITIVE Sensitive     TRIMETH/SULFA <=10 SENSITIVE Sensitive     CLINDAMYCIN <=0.25 SENSITIVE Sensitive     RIFAMPIN <=0.5 SENSITIVE Sensitive     Inducible Clindamycin NEGATIVE Sensitive     * ABUNDANT STAPHYLOCOCCUS AUREUS  MRSA PCR Screening     Status: None   Collection Time: 11/08/2020  3:12 PM   Specimen: Nasopharyngeal  Result Value Ref Range Status   MRSA by PCR NEGATIVE NEGATIVE Final    Comment:        The GeneXpert MRSA Assay (FDA approved for NASAL specimens only), is one component of a comprehensive MRSA colonization surveillance program. It is not intended to diagnose MRSA infection nor to guide or monitor treatment for MRSA infections. Performed at [facility], [address] [place], [place] [address]   Culture, blood (routine x 2)     Status: None (Preliminary result)   Collection Time: 10/22/20 11:40 AM   Specimen: BLOOD LEFT ARM  Result Value Ref Range Status   Specimen Description BLOOD LEFT ARM  Final   Special Requests   Final    BOTTLES DRAWN AEROBIC AND ANAEROBIC Blood Culture adequate volume   Culture   Final    NO GROWTH 2 DAYS Performed at [facility], [address], [place], [place] [address]    Report Status PENDING  Incomplete  Culture, blood (routine x 2)     Status: None (Preliminary result)   Collection Time: 10/22/20 11:55 AM   Specimen: BLOOD LEFT HAND  Result Value Ref Range Status   Specimen Description BLOOD LEFT HAND  Final   Special Requests   Final    BOTTLES DRAWN AEROBIC AND ANAEROBIC Blood Culture adequate volume   Culture   Final    NO GROWTH 2 DAYS Performed  at [facility], [address], [place], [place] [address]    Report Status PENDING  Incomplete      Radiology Studies: ECHOCARDIOGRAM COMPLETE  Result Date: 10/23/2020    ECHOCARDIOGRAM REPORT   Patient Name:   Marcus Downs. Date of Exam: 10/23/2020 Medical Rec #:  [id number]         Height:       72.0 in Accession #:    [id number]        Weight:       155.2 lb Date of Birth:  Mar 28, 1941          BSA:          1.913 m Patient Age:    79 years          BP:           135/78 mmHg Patient Gender: M                 HR:           136 bpm. Exam Location:  Inpatient Procedure: 2D Echo, Cardiac Doppler and Color Doppler Indications:    Bacteremia.  History:        Patient has no prior history of Echocardiogram examinations.                 Abnormal ECG and Ventricular bigeminy; Risk                 Factors:Hypertension, Diabetes,  Dyslipidemia and Former Smoker.  Sonographer:    Sheralyn Boatman[place] West RDCS Referring Phys: [id number] BRADLEY L ICARD IMPRESSIONS  1. Very difficult study due to intermittent tachycardia and intermittent ventricular bigeminy. EF appears low normal 50-55%. Left ventricular ejection fraction, by estimation, is 50 to 55%. The left ventricle has low normal function. The left ventricle has no regional wall motion abnormalities. Left ventricular diastolic function could not be evaluated.  2. Right ventricular systolic function is normal. The right ventricular size is normal. Tricuspid regurgitation signal is inadequate for assessing PA pressure.  3. The mitral valve is grossly normal. No evidence of mitral valve regurgitation. No evidence of mitral stenosis.  4. The NCC contains focal calcification. There is no regurgitation to suggest this is vegetation. The aortic valve is tricuspid. There is mild calcification of the aortic valve. Aortic valve regurgitation is not visualized. Mild aortic valve sclerosis is present, with no evidence of aortic valve stenosis.  5. The inferior vena cava is normal in  size with greater than 50% respiratory variability, suggesting right atrial pressure of 3 mmHg. Conclusion(s)/Recommendation(s): No evidence of valvular vegetations on this transthoracic echocardiogram. Would recommend a transesophageal echocardiogram to exclude infective endocarditis if clinically indicated. FINDINGS  Left Ventricle: Very difficult study due to intermittent tachycardia and intermittent ventricular bigeminy. EF appears low normal 50-55%. Left ventricular ejection fraction, by estimation, is 50 to 55%. The left ventricle has low normal function. The left ventricle has no regional wall motion abnormalities. The left ventricular internal cavity size was normal in size. There is no left ventricular hypertrophy. Left ventricular diastolic function could not be evaluated due to nondiagnostic images. Left  ventricular diastolic function could not be evaluated. Right Ventricle: The right ventricular size is normal. No increase in right ventricular wall thickness. Right ventricular systolic function is normal. Tricuspid regurgitation signal is inadequate for assessing PA pressure. Left Atrium: Left atrial size was normal in size. Right Atrium: Right atrial size was normal in size. Pericardium: There is no evidence of pericardial effusion. Mitral Valve: The mitral valve is grossly normal. No evidence of mitral valve regurgitation. No evidence of mitral valve stenosis. Tricuspid Valve: The tricuspid valve is grossly normal. Tricuspid valve regurgitation is trivial. No evidence of tricuspid stenosis. Aortic Valve: The NCC contains focal calcification. There is no regurgitation to suggest this is vegetation. The aortic valve is tricuspid. There is mild calcification of the aortic valve. Aortic valve regurgitation is not visualized. Mild aortic valve sclerosis is present, with no evidence of aortic valve stenosis. Pulmonic Valve: The pulmonic valve was grossly normal. Pulmonic valve regurgitation is not  visualized. No evidence of pulmonic stenosis. Aorta: The aortic root and ascending aorta are structurally normal, with no evidence of dilitation. Venous: The inferior vena cava is normal in size with greater than 50% respiratory variability, suggesting right atrial pressure of 3 mmHg. IAS/Shunts: The atrial septum is grossly normal. Additional Comments: There is a small pleural effusion in the left lateral region.  LEFT VENTRICLE PLAX 2D LVIDd:         4.50 cm LVIDs:         3.20 cm LV PW:         1.30 cm LV IVS:        0.90 cm LVOT diam:     2.00 cm LV SV:         57 LV SV Index:   30 LVOT Area:     3.14 cm  LV Volumes (MOD) LV vol d, MOD  A2C: 42.9 ml LV vol d, MOD A4C: 62.1 ml LV vol s, MOD A2C: 27.5 ml LV vol s, MOD A4C: 24.8 ml LV SV MOD A2C:     15.4 ml LV SV MOD A4C:     62.1 ml LV SV MOD BP:      25.9 ml RIGHT VENTRICLE            IVC RV S prime:     9.32 cm/s  IVC diam: 1.80 cm TAPSE (M-mode): 1.4 cm LEFT ATRIUM             Index       RIGHT ATRIUM           Index LA diam:        2.50 cm 1.31 cm/m  RA Area:     11.40 cm LA Vol (A2C):   16.2 ml 8.47 ml/m  RA Volume:   25.00 ml  13.07 ml/m LA Vol (A4C):   20.0 ml 10.46 ml/m LA Biplane Vol: 18.8 ml 9.83 ml/m  AORTIC VALVE LVOT Vmax:   99.20 cm/s LVOT Vmean:  70.000 cm/s LVOT VTI:    0.183 m  AORTA Ao Root diam: 3.30 cm Ao Asc diam:  3.00 cm MITRAL VALVE MV Area (PHT): 4.15 cm    SHUNTS MV Decel Time: 183 msec    Systemic VTI:  0.18 m MV E velocity: 67.50 cm/s  Systemic Diam: 2.00 cm MV A velocity: 88.70 cm/s MV E/A ratio:  0.76 Lennie Odor MD Electronically signed by Lennie Odor MD Signature Date/Time: 10/23/2020/11:56:59 AM    Final        LOS: 3 days   Osvaldo Shipper  [facility] Pager on [email or web address]  10/24/2020, 11:48 AM

## 2020-10-24 NOTE — Progress Notes (Signed)
PT Cancellation Note  Patient Details Name: Marcus Downs. MRN: [id number] DOB: 06-Feb-1941   Cancelled Treatment:    Reason Eval/Treat Not Completed: Patient not medically ready (pt remains with tachycardia and bedrest. Will sign off and await new order when appropriate)   [name] [name] 10/24/2020, 6:36 AM [name], PT [facility] Pager: [phone] Office: [phone]

## 2020-10-25 DIAGNOSIS — A4101 Sepsis due to Methicillin susceptible Staphylococcus aureus: Secondary | ICD-10-CM | POA: Diagnosis not present

## 2020-10-25 DIAGNOSIS — T8459XA Infection and inflammatory reaction due to other internal joint prosthesis, initial encounter: Secondary | ICD-10-CM | POA: Diagnosis not present

## 2020-10-25 DIAGNOSIS — E870 Hyperosmolality and hypernatremia: Secondary | ICD-10-CM

## 2020-10-25 DIAGNOSIS — R6520 Severe sepsis without septic shock: Secondary | ICD-10-CM | POA: Diagnosis not present

## 2020-10-25 DIAGNOSIS — B9561 Methicillin susceptible Staphylococcus aureus infection as the cause of diseases classified elsewhere: Secondary | ICD-10-CM | POA: Diagnosis not present

## 2020-10-25 DIAGNOSIS — R7989 Other specified abnormal findings of blood chemistry: Secondary | ICD-10-CM

## 2020-10-25 DIAGNOSIS — N179 Acute kidney failure, unspecified: Secondary | ICD-10-CM

## 2020-10-25 DIAGNOSIS — L0291 Cutaneous abscess, unspecified: Secondary | ICD-10-CM

## 2020-10-25 DIAGNOSIS — Z96649 Presence of unspecified artificial hip joint: Secondary | ICD-10-CM

## 2020-10-25 DIAGNOSIS — E860 Dehydration: Secondary | ICD-10-CM

## 2020-10-25 LAB — COMPREHENSIVE METABOLIC PANEL
ALT: 45 U/L — ABNORMAL HIGH (ref 0–44)
AST: 153 U/L — ABNORMAL HIGH (ref 15–41)
Albumin: 1.5 g/dL — ABNORMAL LOW (ref 3.5–5.0)
Alkaline Phosphatase: 188 U/L — ABNORMAL HIGH (ref 38–126)
Anion gap: 9 (ref 5–15)
BUN: 46 mg/dL — ABNORMAL HIGH (ref 8–23)
CO2: 23 mmol/L (ref 22–32)
Calcium: 8.3 mg/dL — ABNORMAL LOW (ref 8.9–10.3)
Chloride: 121 mmol/L — ABNORMAL HIGH (ref 98–111)
Creatinine, Ser: 1.23 mg/dL (ref 0.61–1.24)
GFR, Estimated: >60 mL/min (ref >60)
Glucose, Bld: 231 mg/dL — ABNORMAL HIGH (ref 70–99)
Potassium: 4.1 mmol/L (ref 3.5–5.1)
Sodium: 153 mmol/L — ABNORMAL HIGH (ref 135–145)
Total Bilirubin: 0.8 mg/dL (ref 0.3–1.2)
Total Protein: 6.8 g/dL (ref 6.5–8.1)

## 2020-10-25 LAB — CBC WITH DIFFERENTIAL/PLATELET
Abs Immature Granulocytes: 0.07 10*3/uL (ref 0.00–0.07)
Basophils Absolute: 0 10*3/uL (ref 0.0–0.1)
Basophils Relative: 0 %
Eosinophils Absolute: 0.3 10*3/uL (ref 0.0–0.5)
Eosinophils Relative: 3 %
HCT: 33.8 % — ABNORMAL LOW (ref 39.0–52.0)
Hemoglobin: 11.4 g/dL — ABNORMAL LOW (ref 13.0–17.0)
Immature Granulocytes: 1 %
Lymphocytes Relative: 15 %
Lymphs Abs: 1.3 10*3/uL (ref 0.7–4.0)
MCH: 25.7 pg — ABNORMAL LOW (ref 26.0–34.0)
MCHC: 33.7 g/dL (ref 30.0–36.0)
MCV: 76.3 fL — ABNORMAL LOW (ref 80.0–100.0)
Monocytes Absolute: 0.3 10*3/uL (ref 0.1–1.0)
Monocytes Relative: 4 %
Neutro Abs: 6.6 10*3/uL (ref 1.7–7.7)
Neutrophils Relative %: 77 %
Platelets: 167 10*3/uL (ref 150–400)
RBC: 4.43 MIL/uL (ref 4.22–5.81)
RDW: 19.7 % — ABNORMAL HIGH (ref 11.5–15.5)
WBC: 8.5 10*3/uL (ref 4.0–10.5)
nRBC: 0 % (ref 0.0–0.2)

## 2020-10-25 LAB — BASIC METABOLIC PANEL
Anion gap: 10 (ref 5–15)
BUN: 38 mg/dL — ABNORMAL HIGH (ref 8–23)
CO2: 21 mmol/L — ABNORMAL LOW (ref 22–32)
Calcium: 8.4 mg/dL — ABNORMAL LOW (ref 8.9–10.3)
Chloride: 120 mmol/L — ABNORMAL HIGH (ref 98–111)
Creatinine, Ser: 1.18 mg/dL (ref 0.61–1.24)
GFR, Estimated: >60 mL/min (ref >60)
Glucose, Bld: 282 mg/dL — ABNORMAL HIGH (ref 70–99)
Potassium: 4.7 mmol/L (ref 3.5–5.1)
Sodium: 151 mmol/L — ABNORMAL HIGH (ref 135–145)

## 2020-10-25 LAB — GLUCOSE, CAPILLARY
Glucose-Capillary: 204 mg/dL — ABNORMAL HIGH (ref 70–99)
Glucose-Capillary: 215 mg/dL — ABNORMAL HIGH (ref 70–99)
Glucose-Capillary: 228 mg/dL — ABNORMAL HIGH (ref 70–99)
Glucose-Capillary: 229 mg/dL — ABNORMAL HIGH (ref 70–99)
Glucose-Capillary: 243 mg/dL — ABNORMAL HIGH (ref 70–99)
Glucose-Capillary: 270 mg/dL — ABNORMAL HIGH (ref 70–99)

## 2020-10-25 MED ORDER — INSULIN GLARGINE 100 UNIT/ML ~~LOC~~ SOLN
25.0000 [IU] | Freq: Two times a day (BID) | SUBCUTANEOUS | Status: DC
  Administered 2020-10-25 – 2020-10-26 (×2): 25 [IU] via SUBCUTANEOUS
  Filled 2020-10-25 (×3): qty 0.25

## 2020-10-25 MED ORDER — METOPROLOL TARTRATE 25 MG/10 ML ORAL SUSPENSION
50.0000 mg | Freq: Three times a day (TID) | ORAL | Status: DC
  Administered 2020-10-25 – 2020-10-26 (×4): 50 mg
  Filled 2020-10-25 (×4): qty 20

## 2020-10-25 MED ORDER — FREE WATER
300.0000 mL | Status: DC
  Administered 2020-10-25 (×3): 300 mL

## 2020-10-25 MED ORDER — PNEUMOCOCCAL VAC POLYVALENT 25 MCG/0.5ML IJ INJ
0.5000 mL | INJECTION | INTRAMUSCULAR | Status: AC | PRN
  Administered 2020-11-10: 0.5 mL via INTRAMUSCULAR
  Filled 2020-10-25: qty 0.5

## 2020-10-25 MED ORDER — METOPROLOL TARTRATE 5 MG/5ML IV SOLN
2.5000 mg | Freq: Four times a day (QID) | INTRAVENOUS | Status: DC | PRN
  Administered 2020-10-25 – 2020-10-27 (×5): 2.5 mg via INTRAVENOUS
  Filled 2020-10-25 (×6): qty 5

## 2020-10-25 MED ORDER — FREE WATER
400.0000 mL | Status: DC
  Administered 2020-10-25 – 2020-10-30 (×27): 400 mL

## 2020-10-25 NOTE — Progress Notes (Signed)
[facility] PROGRESS NOTE   Denton Lank. ERX:[id number] DOB: 1941-08-09 DOA: 11/12/2020  PCP: [name], MD  Brief History/Interval Summary: Mr. 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 Luiz Blare 07/2020. He was sent to the ED from his nursing home for worsening altered mental status, agitation, and findings of AKI with electrolyte abnormalities.  CT A/P revealed prominent fluid collection of the left hip, lateral to arthroplasty hardware. [place] was performed of the area, revealed complex fluid collection.  Reason for Visit: Sepsis secondary to MSSA  Consultants: Orthopedics.  Interventional radiology.  Critical care medicine.  Procedures: 12/4: Left hip fluid collection drainage by IR  Antibiotics: Anti-infectives (From admission, onward)   Start     Dose/Rate Route Frequency Ordered Stop   10/24/20 0815  ceFAZolin (ANCEF) IVPB 2g/100 mL premix        2 g 200 mL/hr over 30 Minutes Intravenous Every 8 hours 10/24/20 0804     10/23/20 2200  ceFAZolin (ANCEF) IVPB 2g/100 mL premix  Status:  Discontinued        2 g 200 mL/hr over 30 Minutes Intravenous Every 12 hours 10/23/20 1406 10/24/20 0804   10/22/20 1000  ceFAZolin (ANCEF) IVPB 1 g/50 mL premix  Status:  Discontinued        1 g 100 mL/hr over 30 Minutes Intravenous Every 12 hours 11/04/2020 1745 10/23/20 1406   11/12/2020 2200  ceFAZolin (ANCEF) IVPB 2g/100 mL premix        2 g 200 mL/hr over 30 Minutes Intravenous  Once 10/24/2020 1739 11/01/2020 2239   11/05/2020 2100  cefTRIAXone (ROCEPHIN) 2 g in sodium chloride 0.9 % 100 mL IVPB  Status:  Discontinued        2 g 200 mL/hr over 30 Minutes Intravenous Every 24 hours 10/28/2020 1117 10/22/2020 1129   11/01/2020 1730  linezolid (ZYVOX) IVPB  600 mg  Status:  Discontinued        600 mg 300 mL/hr over 60 Minutes Intravenous Every 12 hours 11/07/2020 1154 11/04/2020 1739   10/31/2020 1130  cefTRIAXone (ROCEPHIN) 2 g in sodium chloride 0.9 % 100 mL IVPB  Status:  Discontinued        2 g 200 mL/hr over 30 Minutes Intravenous Every 24 hours 11/16/2020 1129 10/24/2020 1739   11/05/2020 1000  meropenem (MERREM) 500 mg in sodium chloride 0.9 % 100 mL IVPB  Status:  Discontinued        500 mg 200 mL/hr over 30 Minutes Intravenous Every 12 hours 10/20/2020 0330 11/13/2020 1117   11/03/2020 0400  linezolid (ZYVOX) IVPB 600 mg        600 mg 300 mL/hr over 60 Minutes Intravenous  Once 10/30/2020 0309 11/01/2020 0654   10/25/2020 0215  ceFEPIme (MAXIPIME) 2 g in sodium chloride 0.9 % 100 mL IVPB        2 g 200 mL/hr over 30 Minutes Intravenous  Once 11/09/2020 0212 10/22/2020 0306   10/26/2020 0215  ampicillin-sulbactam (UNASYN) 1.5 g in sodium chloride 0.9 % 100 mL IVPB  Status:  Discontinued        1.5 g 200 mL/hr over 30 Minutes Intravenous  Once 11/12/2020 0212 10/29/2020 0214   11/06/2020 0215  vancomycin (VANCOCIN) IVPB 1000 mg/200 mL premix  Status:  Discontinued  1,000 mg 200 mL/hr over 60 Minutes Intravenous  Once 10/25/2020 1610 11/15/2020 0314      Subjective/Interval History: Patient noted to be lethargic this morning.  Does not open his eyes.  No overnight events noted.     Assessment/Plan:  MSSA bacteremia/sepsis secondary to MSSA/left hip abscess/septic shock Patient was in the intensive care unit due to septic shock.  He required pressors.  Has been weaned off of pressors.  Cultures are growing MSSA.  Infectious disease is following.   Patient currently on cefazolin.  Echocardiogram does not show any vegetation.  Repeat blood cultures have been done.   Patient not a good candidate for operative intervention of the left hip.  Interventional radiology was consulted and abscess has been drained. Further management per ID.  Orthopedics continues to  follow.  SVT/sinus tachycardia Heart rate remains elevated.  Multiple EKGs have been done all of which suggest sinus tachycardia.  Discussed also with cardiology who reviewed the EKG.  They also feel that this is sinus tachycardia with some ectopy.  Continue with beta-blocker.  Dose was increased yesterday.  May need to go up again today if there is no improvement.  Continue to treat underlying cause.  TSH was noted to be normal at 1.6.    Acute metabolic encephalopathy in the setting of history of stroke and vascular dementia Patient's baseline mentation not entirely clear.  Discussed with her son who mentions that over the past 3 months patient has had a decline in his mentation.  Seems to be lethargic today.  Recent brain imaging studies did not show any acute process.  Acute illness likely contributing to his encephalopathy.  Continue to monitor.    Acute kidney injury/hypernatremia Likely secondary to sepsis and prerenal acidemia.  Renal function has improved.  Sodium however continues to climb.  We will increase the free water down his NG tube.  Recheck his sodium level this afternoon.  Potassium normal.    Microcytic anemia History of iron deficiency is noted.  Hemoglobin more or less at baseline.  No evidence of overt bleeding.  Transaminitis Thought to be due to septic shock.  Although AST was not noted to be significantly elevated.  LFTs remain elevated but stable.  Continue to monitor.  Diabetes mellitus type 2, uncontrolled with hyperglycemia HbA1c 8.1 when last checked.  CBGs elevated yesterday.  Lantus dose was adjusted yesterday.  Better today but may need further dose titration.  Continue Lantus and SSI.    History of dysphagia Currently on tube feedings via NG tube.  Will eventually need to have speech therapy evaluate him but currently is too lethargic and encephalopathic to cooperate..  Goals of care Long discussion with patient's son regarding patient's trajectory over the  last several months.  He was told about his severity of illness.  Also told about the fact that patient has a history of vascular dementia which will not get any better.  Despite all of this patient's wants to continue full scope of care for now. Patient's prognosis seems to be guarded at this time.  Pressure injury Pressure Injury 08/07/20 Buttocks Right;Medial;Left Stage 2 -  Partial thickness loss of dermis presenting as a shallow open injury with a red, pink wound bed without slough. (Active)  08/07/20 1914  Location: Buttocks  Location Orientation: Right;Medial;Left  Staging: Stage 2 -  Partial thickness loss of dermis presenting as a shallow open injury with a red, pink wound bed without slough.  Wound Description (Comments):   Present  on Admission: Yes    Severe malnutrition Nutrition Problem: Severe Malnutrition Etiology: chronic illness (dementia)  Signs/Symptoms: severe fat depletion, severe muscle depletion, percent weight loss (15.4% weight loss in 9 months) Percent weight loss: 15.4 % (less than 9 months)  Interventions: Tube feeding, Prostat, MVI   DVT Prophylaxis: Subcutaneous heparin Code Status: Full code.  This was discussed with patient's son in detail. Family Communication: Discussed with patient's son yesterday Disposition Plan: Return to SNF when improved  Status is: Inpatient  Remains inpatient appropriate because:Altered mental status, IV treatments appropriate due to intensity of illness or inability to take PO and Inpatient level of care appropriate due to severity of illness   Dispo:  Patient From: Skilled Nursing Facility  Planned Disposition: Skilled Nursing Facility  Expected discharge date: 10/28/20  Medically stable for discharge: No      Medications:  Scheduled: . brimonidine  1 drop Both Eyes TID  . Chlorhexidine Gluconate Cloth  6 each Topical Daily  . feeding supplement (PROSource TF)  45 mL Per Tube Daily  . free water  300 mL Per  Tube Q4H  . heparin injection (subcutaneous)  5,000 Units Subcutaneous Q8H  . insulin aspart  0-15 Units Subcutaneous Q4H  . insulin aspart  5 Units Subcutaneous Q4H  . insulin glargine  20 Units Subcutaneous BID  . latanoprost  1 drop Both Eyes QHS  . metoprolol tartrate  25 mg Per Tube Q8H  . pantoprazole (PROTONIX) IV  40 mg Intravenous QHS  . sodium chloride flush  5 mL Intracatheter Q8H   Continuous: . sodium chloride Stopped (10/25/20 0603)  .  ceFAZolin (ANCEF) IV 2 g (10/25/20 0849)  . feeding supplement (OSMOLITE 1.2 CAL) 1,000 mL (10/24/20 0711)   ZOX:WRUEAV chloride, acetaminophen, docusate, HYDROmorphone (DILAUDID) injection, LORazepam, metoprolol tartrate, oxyCODONE, polyethylene glycol   Objective:  Vital Signs  Vitals:   10/25/20 0500 10/25/20 0600 10/25/20 0711 10/25/20 0900  BP: 126/82 131/68  130/77  Pulse: (!) 128 (!) 135  (!) 129  Resp: (!) 29 (!) 33    Temp:   98.2 F (36.8 C)   TempSrc:   Oral   SpO2: 100% 100%    Weight:      Height:        Intake/Output Summary (Last 24 hours) at 10/25/2020 1014 Last data filed at 10/25/2020 0800 Gross per 24 hour  Intake 1962.24 ml  Output 2446 ml  Net -483.76 ml   Filed Weights   10/23/20 0700 10/24/20 0500 10/25/20 0332  Weight: 70.4 kg 70 kg 69.3 kg    General appearance: Lethargic.  In no distress Resp: Mildly tachypneic.  Diminished air entry at the bases with few crackles bilaterally.  No wheezing or rhonchi. Cardio: S1-S2 is tachycardic regular.  No S3-S4.  Systolic murmur appreciated over the precordium GI: Abdomen is soft.  Nontender nondistended.  Bowel sounds are present normal.  No masses organomegaly Extremities: No edema.  Neurologic: No focal neurological deficits.     Lab Results:  Data Reviewed: I have personally reviewed following labs and imaging studies  CBC: Recent Labs  Lab 11/01/2020 0100 11/06/2020 0404 10/22/20 0032 10/22/20 0512 10/23/20 0454 10/24/20 0132 10/25/20 0055   WBC 13.2*  --   --  9.9 8.0 7.1 8.5  NEUTROABS 11.7*  --   --  8.1* 5.8 5.5 6.6  HGB 12.4*   < > 10.6* 10.5* 10.1* 10.7* 11.4*  HCT 37.1*   < > 31.3* 29.9* 29.1* 30.6* 33.8*  MCV  78.1*  --   --  74.9* 75.4* 74.8* 76.3*  PLT 268  --   --  210 165 166 167   < > = values in this interval not displayed.    Basic Metabolic Panel: Recent Labs  Lab 10/22/20 0032 10/22/20 0512 10/22/20 0933 10/22/20 1841 10/23/20 0454 10/23/20 1807 10/24/20 0132 10/25/20 0543  NA 145 146*  --   --  146*  --  148* 153*  K 5.7* 4.9  --   --  3.8  --  3.8 4.1  CL 115* 115*  --   --  117*  --  116* 121*  CO2 18* 20*  --   --  17*  --  22 23  GLUCOSE 257* 202*  --   --  342*  --  372* 231*  BUN 77* 75*  --   --  75*  --  64* 46*  CREATININE 3.39* 3.16*  --   --  2.07*  --  1.80* 1.23  CALCIUM 7.9* 8.0*  --   --  8.0*  --  8.1* 8.3*  MG  --  2.4 2.4 2.6* 2.6* 2.5*  --   --   PHOS  --  4.5 4.5 4.1 3.5 3.0  --   --     GFR: Estimated Creatinine Clearance: 48.5 mL/min (by C-G formula based on SCr of 1.23 mg/dL).  Liver Function Tests: Recent Labs  Lab 11/02/2020 0100 10/22/20 0512 10/25/20 0543  AST 123* 70* 153*  ALT 58* 39 45*  ALKPHOS 203* 138* 188*  BILITOT 1.6* 0.5 0.8  PROT 8.1 6.4* 6.8  ALBUMIN 1.5* 1.2* 1.5*     Recent Labs  Lab 10/27/2020 0722  AMMONIA <9*    Coagulation Profile: Recent Labs  Lab 11/10/2020 0100  INR 1.4*    CBG: Recent Labs  Lab 10/24/20 1502 10/24/20 1913 10/24/20 2305 10/25/20 0306 10/25/20 0710  GLUCAP 269* 190* 244* 229* 215*      Recent Results (from the past 240 hour(s))  Culture, blood (Routine x 2)     Status: Abnormal   Collection Time: 10/20/2020  1:00 AM   Specimen: BLOOD  Result Value Ref Range Status   Specimen Description BLOOD SITE NOT SPECIFIED  Final   Special Requests   Final    BOTTLES DRAWN AEROBIC AND ANAEROBIC Blood Culture adequate volume   Culture  Setup Time   Final    GRAM POSITIVE COCCI IN CLUSTERS IN BOTH AEROBIC AND  ANAEROBIC BOTTLES CRITICAL VALUE NOTED.  VALUE IS CONSISTENT WITH PREVIOUSLY REPORTED AND CALLED VALUE.    Culture (A)  Final    STAPHYLOCOCCUS AUREUS SUSCEPTIBILITIES PERFORMED ON PREVIOUS CULTURE WITHIN THE LAST 5 DAYS. Performed at [facility] Lab, [address] [place], [place] [address]    Report Status 10/23/2020 FINAL  Final  Culture, blood (Routine x 2)     Status: Abnormal   Collection Time: 10/20/2020  1:00 AM   Specimen: BLOOD  Result Value Ref Range Status   Specimen Description BLOOD SITE NOT SPECIFIED  Final   Special Requests   Final    BOTTLES DRAWN AEROBIC AND ANAEROBIC Blood Culture results may not be optimal due to an inadequate volume of blood received in culture bottles   Culture  Setup Time   Final    GRAM POSITIVE COCCI IN CLUSTERS IN BOTH AEROBIC AND ANAEROBIC BOTTLES CRITICAL RESULT CALLED TO, READ BACK BY AND VERIFIED WITH: E MARTEN,PHARMD AT 1717 11/08/2020 BY L  BENFIELD Performed at [facility], [address] [place], [place] [address]    Culture STAPHYLOCOCCUS AUREUS (A)  Final   Report Status 10/23/2020 FINAL  Final   Organism ID, Bacteria STAPHYLOCOCCUS AUREUS  Final      Susceptibility   Staphylococcus aureus - MIC*    CIPROFLOXACIN >=8 RESISTANT Resistant     ERYTHROMYCIN <=0.25 SENSITIVE Sensitive     GENTAMICIN <=0.5 SENSITIVE Sensitive     OXACILLIN 0.5 SENSITIVE Sensitive     TETRACYCLINE <=1 SENSITIVE Sensitive     VANCOMYCIN <=0.5 SENSITIVE Sensitive     TRIMETH/SULFA <=10 SENSITIVE Sensitive     CLINDAMYCIN <=0.25 SENSITIVE Sensitive     RIFAMPIN <=0.5 SENSITIVE Sensitive     Inducible Clindamycin NEGATIVE Sensitive     * STAPHYLOCOCCUS AUREUS  Blood Culture ID Panel (Reflexed)     Status: Abnormal   Collection Time: October 28, 2020  1:00 AM  Result Value Ref Range Status   Enterococcus faecalis NOT DETECTED NOT DETECTED Final   Enterococcus Faecium NOT DETECTED NOT DETECTED Final   Listeria monocytogenes NOT DETECTED NOT DETECTED  Final   Staphylococcus species DETECTED (A) NOT DETECTED Final    Comment: CRITICAL RESULT CALLED TO, READ BACK BY AND VERIFIED WITH: E MARTEN,PHARMD AT 1717 10-28-2020 BY L BENFIELD    Staphylococcus aureus (BCID) DETECTED (A) NOT DETECTED Final    Comment: CRITICAL RESULT CALLED TO, READ BACK BY AND VERIFIED WITH: E MARTEN,PHARMD AT 1717 10-28-20 BY L BENFIELD    Staphylococcus epidermidis NOT DETECTED NOT DETECTED Final   Staphylococcus lugdunensis NOT DETECTED NOT DETECTED Final   Streptococcus species NOT DETECTED NOT DETECTED Final   Streptococcus agalactiae NOT DETECTED NOT DETECTED Final   Streptococcus pneumoniae NOT DETECTED NOT DETECTED Final   Streptococcus pyogenes NOT DETECTED NOT DETECTED Final   A.calcoaceticus-baumannii NOT DETECTED NOT DETECTED Final   Bacteroides fragilis NOT DETECTED NOT DETECTED Final   Enterobacterales NOT DETECTED NOT DETECTED Final   Enterobacter cloacae complex NOT DETECTED NOT DETECTED Final   Escherichia coli NOT DETECTED NOT DETECTED Final   Klebsiella aerogenes NOT DETECTED NOT DETECTED Final   Klebsiella oxytoca NOT DETECTED NOT DETECTED Final   Klebsiella pneumoniae NOT DETECTED NOT DETECTED Final   Proteus species NOT DETECTED NOT DETECTED Final   Salmonella species NOT DETECTED NOT DETECTED Final   Serratia marcescens NOT DETECTED NOT DETECTED Final   Haemophilus influenzae NOT DETECTED NOT DETECTED Final   Neisseria meningitidis NOT DETECTED NOT DETECTED Final   Pseudomonas aeruginosa NOT DETECTED NOT DETECTED Final   Stenotrophomonas maltophilia NOT DETECTED NOT DETECTED Final   Candida albicans NOT DETECTED NOT DETECTED Final   Candida auris NOT DETECTED NOT DETECTED Final   Candida glabrata NOT DETECTED NOT DETECTED Final   Candida krusei NOT DETECTED NOT DETECTED Final   Candida parapsilosis NOT DETECTED NOT DETECTED Final   Candida tropicalis NOT DETECTED NOT DETECTED Final   Cryptococcus neoformans/gattii NOT DETECTED NOT  DETECTED Final   Meth resistant mecA/C and MREJ NOT DETECTED NOT DETECTED Final    Comment: Performed at [facility], [address], [place], [place] [address]  Resp Panel by RT-PCR (Flu A&B, Covid) Nasopharyngeal Swab     Status: None   Collection Time: 2020/10/28  2:46 AM   Specimen: Nasopharyngeal Swab; Nasopharyngeal(NP) swabs in vial transport medium  Result Value Ref Range Status   SARS Coronavirus 2 by RT PCR NEGATIVE NEGATIVE Final    Comment: (NOTE) SARS-CoV-2 target nucleic acids are  NOT DETECTED.  The SARS-CoV-2 RNA is generally detectable in upper respiratory specimens during the acute phase of infection. The lowest concentration of SARS-CoV-2 viral copies this assay can detect is 138 copies/mL. A negative result does not preclude SARS-Cov-2 infection and should not be used as the sole basis for treatment or other patient management decisions. A negative result may occur with  improper specimen collection/handling, submission of specimen other than nasopharyngeal swab, presence of viral mutation(s) within the areas targeted by this assay, and inadequate number of viral copies(<138 copies/mL). A negative result must be combined with clinical observations, patient history, and epidemiological information. The expected result is Negative.  Fact Sheet for Patients:  BloggerCourse.com  Fact Sheet for Healthcare Providers:  SeriousBroker.it  This test is no t yet approved or cleared by the [place] FDA and  has been authorized for detection and/or diagnosis of SARS-CoV-2 by FDA under an Emergency Use Authorization (EUA). This EUA will remain  in effect (meaning this test can be used) for the duration of the COVID-19 declaration under Section 564(b)(1) of the Act, 21 U.S.C.section 360bbb-3(b)(1), unless the authorization is terminated  or revoked sooner.       Influenza A by PCR NEGATIVE NEGATIVE Final    Influenza B by PCR NEGATIVE NEGATIVE Final    Comment: (NOTE) The Xpert Xpress SARS-CoV-2/FLU/RSV plus assay is intended as an aid in the diagnosis of influenza from Nasopharyngeal swab specimens and should not be used as a sole basis for treatment. Nasal washings and aspirates are unacceptable for Xpert Xpress SARS-CoV-2/FLU/RSV testing.  Fact Sheet for Patients: BloggerCourse.com  Fact Sheet for Healthcare Providers: SeriousBroker.it  This test is not yet approved or cleared by the [place] FDA and has been authorized for detection and/or diagnosis of SARS-CoV-2 by FDA under an Emergency Use Authorization (EUA). This EUA will remain in effect (meaning this test can be used) for the duration of the COVID-19 declaration under Section 564(b)(1) of the Act, 21 U.S.C. section 360bbb-3(b)(1), unless the authorization is terminated or revoked.  Performed at [facility] Lab, [address], [place], [place] [address]   Urine culture     Status: Abnormal   Collection Time: 10/25/2020  2:48 AM   Specimen: In/Out Cath Urine  Result Value Ref Range Status   Specimen Description IN/OUT CATH URINE  Final   Special Requests   Final    NONE Performed at [facility], [address], [place], [place] [address]    Culture >=100,000 COLONIES/mL STAPHYLOCOCCUS AUREUS (A)  Final   Report Status 10/23/2020 FINAL  Final   Organism ID, Bacteria STAPHYLOCOCCUS AUREUS (A)  Final      Susceptibility   Staphylococcus aureus - MIC*    CIPROFLOXACIN >=8 RESISTANT Resistant     GENTAMICIN <=0.5 SENSITIVE Sensitive     NITROFURANTOIN 32 SENSITIVE Sensitive     OXACILLIN 0.5 SENSITIVE Sensitive     TETRACYCLINE <=1 SENSITIVE Sensitive     VANCOMYCIN <=0.5 SENSITIVE Sensitive     TRIMETH/SULFA <=10 SENSITIVE Sensitive     CLINDAMYCIN <=0.25 SENSITIVE Sensitive     RIFAMPIN <=0.5 SENSITIVE Sensitive     Inducible Clindamycin NEGATIVE  Sensitive     * >=100,000 COLONIES/mL STAPHYLOCOCCUS AUREUS  Aerobic/Anaerobic Culture (surgical/deep wound)     Status: None (Preliminary result)   Collection Time: 10/22/2020  1:47 PM   Specimen: Abscess  Result Value Ref Range Status   Specimen Description ABSCESS  Final   Special Requests LEFT THIGH/HIP  ABSC  Final   Gram Stain   Final    FEW WBC PRESENT, PREDOMINANTLY PMN MODERATE GRAM POSITIVE COCCI IN PAIRS IN CLUSTERS Performed at [facility] Lab, [address], [place], [place] [address]    Culture   Final    ABUNDANT STAPHYLOCOCCUS AUREUS NO ANAEROBES ISOLATED; CULTURE IN PROGRESS FOR 5 DAYS    Report Status PENDING  Incomplete   Organism ID, Bacteria STAPHYLOCOCCUS AUREUS  Final      Susceptibility   Staphylococcus aureus - MIC*    CIPROFLOXACIN >=8 RESISTANT Resistant     ERYTHROMYCIN <=0.25 SENSITIVE Sensitive     GENTAMICIN <=0.5 SENSITIVE Sensitive     OXACILLIN 0.5 SENSITIVE Sensitive     TETRACYCLINE <=1 SENSITIVE Sensitive     VANCOMYCIN <=0.5 SENSITIVE Sensitive     TRIMETH/SULFA <=10 SENSITIVE Sensitive     CLINDAMYCIN <=0.25 SENSITIVE Sensitive     RIFAMPIN <=0.5 SENSITIVE Sensitive     Inducible Clindamycin NEGATIVE Sensitive     * ABUNDANT STAPHYLOCOCCUS AUREUS  MRSA PCR Screening     Status: None   Collection Time: 11/03/2020  3:12 PM   Specimen: Nasopharyngeal  Result Value Ref Range Status   MRSA by PCR NEGATIVE NEGATIVE Final    Comment:        The GeneXpert MRSA Assay (FDA approved for NASAL specimens only), is one component of a comprehensive MRSA colonization surveillance program. It is not intended to diagnose MRSA infection nor to guide or monitor treatment for MRSA infections. Performed at [facility], [address] [place], [place] [address]   Culture, blood (routine x 2)     Status: None (Preliminary result)   Collection Time: 10/22/20 11:40 AM   Specimen: BLOOD LEFT ARM  Result Value Ref Range Status   Specimen  Description BLOOD LEFT ARM  Final   Special Requests   Final    BOTTLES DRAWN AEROBIC AND ANAEROBIC Blood Culture adequate volume   Culture   Final    NO GROWTH 3 DAYS Performed at [facility], [address], [place], [place] [address]    Report Status PENDING  Incomplete  Culture, blood (routine x 2)     Status: None (Preliminary result)   Collection Time: 10/22/20 11:55 AM   Specimen: BLOOD LEFT HAND  Result Value Ref Range Status   Specimen Description BLOOD LEFT HAND  Final   Special Requests   Final    BOTTLES DRAWN AEROBIC AND ANAEROBIC Blood Culture adequate volume   Culture   Final    NO GROWTH 3 DAYS Performed at [facility], [address] [place], [place] [address]    Report Status PENDING  Incomplete      Radiology Studies: MR BRAIN WO CONTRAST  Result Date: 10/24/2020 CLINICAL DATA:  Initial evaluation for brain abscess, history of MSSA bacteremia. EXAM: MRI HEAD WITHOUT CONTRAST TECHNIQUE: Multiplanar, multiecho pulse sequences of the brain and surrounding structures were obtained without intravenous contrast. COMPARISON:  Prior CT from 08/06/2020. FINDINGS: Brain: Diffuse prominence of the CSF containing spaces compatible generalized age-related cerebral atrophy. Patchy and confluent T2/FLAIR hyperintensity within the periventricular and deep white matter both cerebral hemispheres most consistent with chronic small vessel ischemic disease, moderate in nature. Extensive encephalomalacia and gliosis involving the right cerebral hemisphere consistent with a chronic right MCA distribution infarct. Associated wallerian degeneration extends into the right cerebral peduncle and midbrain/pons. No abnormal foci of restricted diffusion to suggest acute or subacute ischemia.  No diffusion abnormality to suggest frank intraparenchymal abscess. Gray-white matter differentiation otherwise maintained. No evidence for acute or chronic intracranial hemorrhage. No mass lesion,  midline shift or mass effect. Ex vacuo dilatation of the right lateral ventricle related to the chronic right MCA distribution infarct. No hydrocephalus. No extra-axial fluid collection. Pituitary gland suprasellar region normal. Midline structures intact. Vascular: Attenuated flow void within the right MCA territory related to the chronic right MCA territory infarct. Major intracranial vascular flow voids are otherwise maintained. Skull and upper cervical spine: Craniocervical junction within normal limits. Visualized upper cervical spine normal. Bone marrow signal intensity normal. No scalp soft tissue abnormality. Sinuses/Orbits: Patient status post bilateral ocular lens replacement. Globes and orbital soft tissues demonstrate no acute finding. Mild chronic mucosal thickening noted within the ethmoidal air cells and maxillary sinuses. No air-fluid levels to suggest acute sinusitis. Mastoid air cells are largely clear. Inner ear structures grossly normal. Other: None. IMPRESSION: 1. No acute intracranial abnormality. Specifically, no evidence for intracranial abscess or other infection. 2. Chronic right MCA distribution infarct. 3. 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 Electronically Signed   By: Rise Mu M.D.   On: 10/24/2020 19:27   ECHOCARDIOGRAM COMPLETE  Result Date: 10/23/2020    ECHOCARDIOGRAM REPORT   Patient Name:   Winifred Balogh. Date of Exam: 10/23/2020 Medical Rec #:  [id number]         Height:       72.0 in Accession #:    [id number]        Weight:       155.2 lb Date of Birth:  [birth year]          BSA:          1.913 m Patient Age:    79 years          BP:           135/78 mmHg Patient Gender: M                 HR:           136 bpm. Exam Location:  Inpatient Procedure: 2D Echo, Cardiac Doppler and Color Doppler Indications:    Bacteremia.  History:        Patient has no prior history of Echocardiogram examinations.                  Abnormal ECG and Ventricular bigeminy; Risk                 Factors:Hypertension, Diabetes, Dyslipidemia and Former Smoker.  Sonographer:    Sheralyn Boatman RDCS Referring Phys: [id number] BRADLEY L ICARD IMPRESSIONS  1. Very difficult study due to intermittent tachycardia and intermittent ventricular bigeminy. EF appears low normal 50-55%. Left ventricular ejection fraction, by estimation, is 50 to 55%. The left ventricle has low normal function. The left ventricle has no regional wall motion abnormalities. Left ventricular diastolic function could not be evaluated.  2. Right ventricular systolic function is normal. The right ventricular size is normal. Tricuspid regurgitation signal is inadequate for assessing PA pressure.  3. The mitral valve is grossly normal. No evidence of mitral valve regurgitation. No evidence of mitral stenosis.  4. The NCC contains focal calcification. There is no regurgitation to suggest this is vegetation. The aortic valve is tricuspid. There is mild calcification of the aortic valve. Aortic valve regurgitation is not visualized. Mild aortic valve sclerosis is present, with no evidence of aortic valve  stenosis.  5. The inferior vena cava is normal in size with greater than 50% respiratory variability, suggesting right atrial pressure of 3 mmHg. Conclusion(s)/Recommendation(s): No evidence of valvular vegetations on this transthoracic echocardiogram. Would recommend a transesophageal echocardiogram to exclude infective endocarditis if clinically indicated. FINDINGS  Left Ventricle: Very difficult study due to intermittent tachycardia and intermittent ventricular bigeminy. EF appears low normal 50-55%. Left ventricular ejection fraction, by estimation, is 50 to 55%. The left ventricle has low normal function. The left ventricle has no regional wall motion abnormalities. The left ventricular internal cavity size was normal in size. There is no left ventricular hypertrophy. Left ventricular  diastolic function could not be evaluated due to nondiagnostic images. Left  ventricular diastolic function could not be evaluated. Right Ventricle: The right ventricular size is normal. No increase in right ventricular wall thickness. Right ventricular systolic function is normal. Tricuspid regurgitation signal is inadequate for assessing PA pressure. Left Atrium: Left atrial size was normal in size. Right Atrium: Right atrial size was normal in size. Pericardium: There is no evidence of pericardial effusion. Mitral Valve: The mitral valve is grossly normal. No evidence of mitral valve regurgitation. No evidence of mitral valve stenosis. Tricuspid Valve: The tricuspid valve is grossly normal. Tricuspid valve regurgitation is trivial. No evidence of tricuspid stenosis. Aortic Valve: The NCC contains focal calcification. There is no regurgitation to suggest this is vegetation. The aortic valve is tricuspid. There is mild calcification of the aortic valve. Aortic valve regurgitation is not visualized. Mild aortic valve sclerosis is present, with no evidence of aortic valve stenosis. Pulmonic Valve: The pulmonic valve was grossly normal. Pulmonic valve regurgitation is not visualized. No evidence of pulmonic stenosis. Aorta: The aortic root and ascending aorta are structurally normal, with no evidence of dilitation. Venous: The inferior vena cava is normal in size with greater than 50% respiratory variability, suggesting right atrial pressure of 3 mmHg. IAS/Shunts: The atrial septum is grossly normal. Additional Comments: There is a small pleural effusion in the left lateral region.  LEFT VENTRICLE PLAX 2D LVIDd:         4.50 cm LVIDs:         3.20 cm LV PW:         1.30 cm LV IVS:        0.90 cm LVOT diam:     2.00 cm LV SV:         57 LV SV Index:   30 LVOT Area:     3.14 cm  LV Volumes (MOD) LV vol d, MOD A2C: 42.9 ml LV vol d, MOD A4C: 62.1 ml LV vol s, MOD A2C: 27.5 ml LV vol s, MOD A4C: 24.8 ml LV SV MOD A2C:      15.4 ml LV SV MOD A4C:     62.1 ml LV SV MOD BP:      25.9 ml RIGHT VENTRICLE            IVC RV S prime:     9.32 cm/s  IVC diam: 1.80 cm TAPSE (M-mode): 1.4 cm LEFT ATRIUM             Index       RIGHT ATRIUM           Index LA diam:        2.50 cm 1.31 cm/m  RA Area:     11.40 cm LA Vol (A2C):   16.2 ml 8.47 ml/m  RA Volume:   25.00 ml  13.07 ml/m LA Vol (A4C):   20.0 ml 10.46 ml/m LA Biplane Vol: 18.8 ml 9.83 ml/m  AORTIC VALVE LVOT Vmax:   99.20 cm/s LVOT Vmean:  70.000 cm/s LVOT VTI:    0.183 m  AORTA Ao Root diam: 3.30 cm Ao Asc diam:  3.00 cm MITRAL VALVE MV Area (PHT): 4.15 cm    SHUNTS MV Decel Time: 183 msec    Systemic VTI:  0.18 m MV E velocity: 67.50 cm/s  Systemic Diam: 2.00 cm MV A velocity: 88.70 cm/s MV E/A ratio:  0.76 Lennie Odor MD Electronically signed by Lennie Odor MD Signature Date/Time: 10/23/2020/11:56:59 AM    Final        LOS: 4 days   Osvaldo Shipper  [facility] Pager on [email or web address]  10/25/2020, 10:14 AM

## 2020-10-25 NOTE — Progress Notes (Signed)
Referring Physician(s): Dr Daiva Eves  Supervising Physician: Irish Lack  Patient Status:  [facility] - In-pt  Chief Complaint:   MSSA bacteremia and infected left total hip arthroplasty  Left lateral thigh abscess drain placed in IR 12/4  Subjective:  Asleep No reaction to my entering room    Allergies: Tape  Medications: Prior to Admission medications   Medication Sig Start Date End Date Taking? Authorizing Provider  acetaminophen (TYLENOL) 325 MG tablet Take 650 mg by mouth every 6 (six) hours as needed for mild pain or fever. 08/14/20  Yes [provider]  Amino Acids-Protein Hydrolys (FEEDING SUPPLEMENT, PRO-STAT 64,) LIQD Take 30 mLs by mouth in the morning and at bedtime. For wound healing 08/15/20  Yes [provider]  aspirin 325 MG tablet Take 325 mg by mouth daily.   Yes [provider]  atorvastatin (LIPITOR) 40 MG tablet Take 1 tablet (40 mg total) by mouth daily. 08/14/20  Yes Rhetta Mura, MD  bisacodyl (DULCOLAX) 10 MG suppository Place 10 mg rectally as needed for moderate constipation.   Yes [provider]  brimonidine (ALPHAGAN P) 0.1 % SOLN Place 1 drop into both eyes 3 (three) times daily.   Yes [provider]  ferrous sulfate 325 (65 FE) MG tablet Take 325 mg by mouth daily.  08/31/18  Yes [name], MD  Insulin Aspart Prot & Aspart (NOVOLOG MIX 70/30 FLEXPEN [place]) Inject 14-18 Units into the skin See admin instructions. Inject 14 Units after breakfast,  Inject 18 units after lunch, inject 16 uints at dinner.   Yes [provider]  latanoprost (XALATAN) 0.005 % ophthalmic solution Place 1 drop into both eyes at bedtime.   Yes [provider]  magnesium hydroxide (MILK OF MAGNESIA) 400 MG/5ML suspension Take 30 mLs by mouth daily as needed for mild constipation.    Yes [provider]  metoprolol tartrate (LOPRESSOR) 25 MG tablet Take 1 tablet (25 mg total) by mouth 2 (two)  times daily. 08/14/20  Yes Rhetta Mura, MD  MULTIPLE VITAMIN PO Take 1 tablet by mouth daily. W/ Minerals to promote wound healing   Yes [provider]  NON FORMULARY Medpass TID d/t weight loss. 09/13/20  Yes [provider]  potassium chloride (KLOR-CON) 10 MEQ tablet TAKE 3 TABLETS BY MOUTH DAILY Patient taking differently: 30 mEq daily.  09/11/20  Yes [name], MD  Sodium Phosphates (RA SALINE ENEMA RE) Place 1 enema rectally daily as needed (for constipation).   Yes [provider]  sodium chloride 0.9 % infusion Inject 1,000 mLs into the vein daily. 249ml/ 1 hour    [provider]  sulfamethoxazole-trimethoprim (BACTRIM DS) 800-160 MG tablet Take 1 tablet by mouth 2 (two) times daily. For 10 days    [provider]  sulfamethoxazole-trimethoprim (BACTRIM) 400-80 MG tablet Take 1 tablet by mouth 2 (two) times daily. For 10 days    [provider]     Vital Signs: BP 130/77   Pulse (!) 129   Temp 98.3 F (36.8 C) (Oral)   Resp (!) 33   Ht 6' (1.829 m)   Wt 152 lb 12.5 oz (69.3 kg)   SpO2 100%   BMI 20.72 kg/m   Physical Exam Skin:    General: Skin is warm.     Comments: Left hip drain is intact OP serous color Minimal in JP now 150 cc yesterday Organism ID, Bacteria STAPHYLOCOCCUS AUREUS       Imaging:  MR BRAIN WO CONTRAST  Result Date: 10/24/2020 CLINICAL DATA:  Initial evaluation for brain abscess, history of MSSA bacteremia. EXAM: MRI HEAD WITHOUT CONTRAST TECHNIQUE: Multiplanar, multiecho pulse sequences of the brain and surrounding structures were obtained without intravenous contrast. COMPARISON:  Prior CT from 08/06/2020. FINDINGS: Brain: Diffuse prominence of the CSF containing spaces compatible generalized age-related cerebral atrophy. Patchy and confluent T2/FLAIR hyperintensity within the periventricular and deep white matter both cerebral hemispheres most consistent with chronic small  vessel ischemic disease, moderate in nature. Extensive encephalomalacia and gliosis involving the right cerebral hemisphere consistent with a chronic right MCA distribution infarct. Associated wallerian degeneration extends into the right cerebral peduncle and midbrain/pons. No abnormal foci of restricted diffusion to suggest acute or subacute ischemia. No diffusion abnormality to suggest frank intraparenchymal abscess. Gray-white matter differentiation otherwise maintained. No evidence for acute or chronic intracranial hemorrhage. No mass lesion, midline shift or mass effect. Ex vacuo dilatation of the right lateral ventricle related to the chronic right MCA distribution infarct. No hydrocephalus. No extra-axial fluid collection. Pituitary gland suprasellar region normal. Midline structures intact. Vascular: Attenuated flow void within the right MCA territory related to the chronic right MCA territory infarct. Major intracranial vascular flow voids are otherwise maintained. Skull and upper cervical spine: Craniocervical junction within normal limits. Visualized upper cervical spine normal. Bone marrow signal intensity normal. No scalp soft tissue abnormality. Sinuses/Orbits: Patient status post bilateral ocular lens replacement. Globes and orbital soft tissues demonstrate no acute finding. Mild chronic mucosal thickening noted within the ethmoidal air cells and maxillary sinuses. No air-fluid levels to suggest acute sinusitis. Mastoid air cells are largely clear. Inner ear structures grossly normal. Other: None. IMPRESSION: 1. No acute intracranial abnormality. Specifically, no evidence for intracranial abscess or other infection. 2. Chronic right MCA distribution infarct. 3. 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 Electronically Signed   By: Rise MuBenjamin  McClintock M.D.   On: 10/24/2020 19:27   DG Abd Portable 1V  Result Date: 11/14/2020 CLINICAL DATA:  NG tube  placement. EXAM: PORTABLE ABDOMEN - 1 VIEW COMPARISON:  None. FINDINGS: NG tube tip is in the stomach. Proximal side port of the NG tube is below the GE junction. IMPRESSION: NG tube tip is in the stomach. Electronically Signed   By: Kennith CenterEric  Mansell M.D.   On: 10/24/2020 18:39   ECHOCARDIOGRAM COMPLETE  Result Date: 10/23/2020    ECHOCARDIOGRAM REPORT   Patient Name:   Marcus LankSamuel J Goltz Jr. Date of Exam: 10/23/2020 Medical Rec #:  [id number]         Height:       72.0 in Accession #:    [id number]        Weight:       155.2 lb Date of Birth:  03/12/1941          BSA:          1.913 m Patient Age:    79 years          BP:           135/78 mmHg Patient Gender: M                 HR:           136 bpm. Exam Location:  Inpatient Procedure: 2D Echo, Cardiac Doppler and Color Doppler Indications:    Bacteremia.  History:        Patient has no prior history of Echocardiogram examinations.  Abnormal ECG and Ventricular bigeminy; Risk                 Factors:Hypertension, Diabetes, Dyslipidemia and Former Smoker.  Sonographer:    Sheralyn Boatman RDCS Referring Phys: [id number] BRADLEY L ICARD IMPRESSIONS  1. Very difficult study due to intermittent tachycardia and intermittent ventricular bigeminy. EF appears low normal 50-55%. Left ventricular ejection fraction, by estimation, is 50 to 55%. The left ventricle has low normal function. The left ventricle has no regional wall motion abnormalities. Left ventricular diastolic function could not be evaluated.  2. Right ventricular systolic function is normal. The right ventricular size is normal. Tricuspid regurgitation signal is inadequate for assessing PA pressure.  3. The mitral valve is grossly normal. No evidence of mitral valve regurgitation. No evidence of mitral stenosis.  4. The NCC contains focal calcification. There is no regurgitation to suggest this is vegetation. The aortic valve is tricuspid. There is mild calcification of the aortic valve. Aortic valve  regurgitation is not visualized. Mild aortic valve sclerosis is present, with no evidence of aortic valve stenosis.  5. The inferior vena cava is normal in size with greater than 50% respiratory variability, suggesting right atrial pressure of 3 mmHg. Conclusion(s)/Recommendation(s): No evidence of valvular vegetations on this transthoracic echocardiogram. Would recommend a transesophageal echocardiogram to exclude infective endocarditis if clinically indicated. FINDINGS  Left Ventricle: Very difficult study due to intermittent tachycardia and intermittent ventricular bigeminy. EF appears low normal 50-55%. Left ventricular ejection fraction, by estimation, is 50 to 55%. The left ventricle has low normal function. The left ventricle has no regional wall motion abnormalities. The left ventricular internal cavity size was normal in size. There is no left ventricular hypertrophy. Left ventricular diastolic function could not be evaluated due to nondiagnostic images. Left  ventricular diastolic function could not be evaluated. Right Ventricle: The right ventricular size is normal. No increase in right ventricular wall thickness. Right ventricular systolic function is normal. Tricuspid regurgitation signal is inadequate for assessing PA pressure. Left Atrium: Left atrial size was normal in size. Right Atrium: Right atrial size was normal in size. Pericardium: There is no evidence of pericardial effusion. Mitral Valve: The mitral valve is grossly normal. No evidence of mitral valve regurgitation. No evidence of mitral valve stenosis. Tricuspid Valve: The tricuspid valve is grossly normal. Tricuspid valve regurgitation is trivial. No evidence of tricuspid stenosis. Aortic Valve: The NCC contains focal calcification. There is no regurgitation to suggest this is vegetation. The aortic valve is tricuspid. There is mild calcification of the aortic valve. Aortic valve regurgitation is not visualized. Mild aortic valve sclerosis  is present, with no evidence of aortic valve stenosis. Pulmonic Valve: The pulmonic valve was grossly normal. Pulmonic valve regurgitation is not visualized. No evidence of pulmonic stenosis. Aorta: The aortic root and ascending aorta are structurally normal, with no evidence of dilitation. Venous: The inferior vena cava is normal in size with greater than 50% respiratory variability, suggesting right atrial pressure of 3 mmHg. IAS/Shunts: The atrial septum is grossly normal. Additional Comments: There is a small pleural effusion in the left lateral region.  LEFT VENTRICLE PLAX 2D LVIDd:         4.50 cm LVIDs:         3.20 cm LV PW:         1.30 cm LV IVS:        0.90 cm LVOT diam:     2.00 cm LV SV:  57 LV SV Index:   30 LVOT Area:     3.14 cm  LV Volumes (MOD) LV vol d, MOD A2C: 42.9 ml LV vol d, MOD A4C: 62.1 ml LV vol s, MOD A2C: 27.5 ml LV vol s, MOD A4C: 24.8 ml LV SV MOD A2C:     15.4 ml LV SV MOD A4C:     62.1 ml LV SV MOD BP:      25.9 ml RIGHT VENTRICLE            IVC RV S prime:     9.32 cm/s  IVC diam: 1.80 cm TAPSE (M-mode): 1.4 cm LEFT ATRIUM             Index       RIGHT ATRIUM           Index LA diam:        2.50 cm 1.31 cm/m  RA Area:     11.40 cm LA Vol (A2C):   16.2 ml 8.47 ml/m  RA Volume:   25.00 ml  13.07 ml/m LA Vol (A4C):   20.0 ml 10.46 ml/m LA Biplane Vol: 18.8 ml 9.83 ml/m  AORTIC VALVE LVOT Vmax:   99.20 cm/s LVOT Vmean:  70.000 cm/s LVOT VTI:    0.183 m  AORTA Ao Root diam: 3.30 cm Ao Asc diam:  3.00 cm MITRAL VALVE MV Area (PHT): 4.15 cm    SHUNTS MV Decel Time: 183 msec    Systemic VTI:  0.18 m MV E velocity: 67.50 cm/s  Systemic Diam: 2.00 cm MV A velocity: 88.70 cm/s MV E/A ratio:  0.76 Lennie Odor MD Electronically signed by Lennie Odor MD Signature Date/Time: 10/23/2020/11:56:59 AM    Final    CT IMAGE GUIDED DRAINAGE BY PERCUTANEOUS CATHETER  Result Date: 11/10/2020 INDICATION: 79 year old with sepsis and evidence for an abscess in the left upper thigh and  hip region. History of left hip arthroplasty. EXAM: CT-GUIDED DRAIN PLACEMENT IN LEFT THIGH ABSCESS MEDICATIONS: None ANESTHESIA/SEDATION: None COMPLICATIONS: None immediate. PROCEDURE: Informed consent was obtained from the patient's son after a thorough discussion of the procedural risks, benefits and alternatives. All questions were addressed. A timeout was performed prior to the initiation of the procedure. Patient was placed on the CT scanner and images through the left upper thigh were obtained. Abscess in the lateral left upper thigh was identified with CT. The overlying skin was prepped with chlorhexidine and sterile field was created. Maximal barrier sterile technique was utilized including caps, mask, sterile gowns, sterile gloves, sterile drape, hand hygiene and skin antiseptic. Skin was anesthetized using 1% lidocaine. Using CT guidance, an 18 gauge trocar needle was directed into the abscess and pink colored purulent fluid was aspirated. Superstiff Amplatz wire was advanced into the abscess and tract was dilated to accommodate a 12 [place] multipurpose drain. 180 mL of purulent fluid was removed. Follow up CT images were obtained. Catheter was sutured to skin and attached to a suction bulb. FINDINGS: Patient has a left hip arthroplasty. Extensive subcutaneous edema in the left upper thigh with air-fluid collection along the lateral aspect of the left hip and left upper thigh. In addition, there is low-density and enlargement of the left obturator externus muscle and the left gluteus medius muscle. There is concern for additional abscess collections in these areas. The large abscess along the left lateral thigh was decompressed following drain placement. IMPRESSION: CT-guided drainage of the abscess in the left lateral thigh and hip region. 180 mL of  purulent fluid was removed. Fluid sample sent for culture. Concern for additional intramuscular abscesses around the left hip involving the left obturator  externus and left gluteal musculature. Electronically Signed   By: Richarda Overlie M.D.   On: 10/20/2020 14:07    Labs:  CBC: Recent Labs    10/22/20 0512 10/23/20 0454 10/24/20 0132 10/25/20 0055  WBC 9.9 8.0 7.1 8.5  HGB 10.5* 10.1* 10.7* 11.4*  HCT 29.9* 29.1* 30.6* 33.8*  PLT 210 165 166 167    COAGS: Recent Labs    10/28/2020 0100  INR 1.4*  APTT 42*    BMP: Recent Labs    08/12/20 0540 08/12/20 0540 08/13/20 0548 08/13/20 0548 08/21/20 0000 08/21/20 0000 09/25/20 0000 10/30/2020 0100 10/22/20 0512 10/23/20 0454 10/24/20 0132 10/25/20 0543  NA 146*   < > 142  --  138   < > 142   < > 146* 146* 148* 153*  K 3.8   < > 3.9   < > 4.7   < > 4.3   < > 4.9 3.8 3.8 4.1  CL 108   < > 110   < > 102   < > 100   < > 115* 117* 116* 121*  CO2 26   < > 22   < > 25*   < > 23*   < > 20* 17* 22 23  GLUCOSE 221*   < > 161*  --   --   --   --    < > 202* 342* 372* 231*  BUN 25*   < > 24*  --  10   < > 9   < > 75* 75* 64* 46*  CALCIUM 7.9*   < > 7.6*   < > 8.5*   < > 9.7   < > 8.0* 8.0* 8.1* 8.3*  CREATININE 1.34*   < > 1.04  --  0.8   < > 1.1   < > 3.16* 2.07* 1.80* 1.23  GFRNONAA 50*   < > >60   < > 83.93   < > 62.96   < > 19* 32* 38* >60  GFRAA 58*  --  >60  --  >90  --  72.97  --   --   --   --   --    < > = values in this interval not displayed.    LIVER FUNCTION TESTS: Recent Labs    08/12/20 0540 08/12/20 0540 08/21/20 0000 10/20/2020 0100 10/22/20 0512 10/25/20 0543  BILITOT 1.1  --   --  1.6* 0.5 0.8  AST 109*   < > 23 123* 70* 153*  ALT 54*   < > 21 58* 39 45*  ALKPHOS 146*  --   --  203* 138* 188*  PROT 6.4*  --   --  8.1 6.4* 6.8  ALBUMIN 2.1*  --   --  1.5* 1.2* 1.5*   < > = values in this interval not displayed.    Assessment and Plan:  Left hip abscess drain placed in IR 12/4 Will follow ID following pt   Electronically Signed: Robet Leu, PA-C 10/25/2020, 11:37 AM   I spent a total of 15 Minutes at the the patient's bedside AND on the  patient's hospital floor or unit, greater than 50% of which was counseling/coordinating care for left hip abscess drain

## 2020-10-25 NOTE — Progress Notes (Signed)
  Speech Language Pathology Treatment: Dysphagia  Patient Details Name: Marcus Downs. MRN: [id number] DOB: 09/27/1941 Today's Date: 10/25/2020 Time: 9702-6378 SLP Time Calculation (min) (ACUTE ONLY): 10 min  Assessment / Plan / Recommendation Clinical Impression  Pt was seen for dysphagia treatment. His level of alertness remains suboptimal. He did not open his eyes throughout the session but intermittently vocalized. Oral care was provided by SLP and pt exhibited lingual manipulation of the toothbrush during oral care as well as sucking. Bolus manipulation was noted with ice chips which resulted in melting, but pt did not swallow any of the resultant thin liquids or boluses of thin liquids via tsp. No volitional swallow was palpated despite verbal and tactile cues. Anterior spillage was therefore noted with these consistencies and additional trials were deferred. Pt's level of alertness continues to place him at increased aspiration risk and it is recommended that his current NPO status be maintained. SLP will continue to follow pt to assess improvement.    HPI HPI: Patient is a 79 y.o. male with PMH: CVA with residual dysphagia and hemiplegia, GERD, DM-2, HTN, HLD, weight loss, s/p hip hemiarthroplasty. He presented to hospital from SNF, s/p hip fracture and surgery 3 months ago and is now septic and being treated for UTI and hip abscess.      SLP Plan  Continue with current plan of care       Recommendations  Diet recommendations: NPO Medication Administration: Via alternative means                Oral Care Recommendations: Oral care QID Follow up Recommendations: Skilled Nursing facility SLP Visit Diagnosis: Dysphagia, unspecified (R13.10) Plan: Continue with current plan of care       Victorio Creeden I. Vear Clock, MS, CCC-SLP Acute Rehabilitation Services Office number [phone] Pager [phone]                Scheryl Marten 10/25/2020, 12:30 PM

## 2020-10-25 NOTE — Progress Notes (Signed)
Subjective: Somnolent Antibiotics:  Anti-infectives (From admission, onward)   Start     Dose/Rate Route Frequency Ordered Stop   10/24/20 0815  ceFAZolin (ANCEF) IVPB 2g/100 mL premix        2 g 200 mL/hr over 30 Minutes Intravenous Every 8 hours 10/24/20 0804     10/23/20 2200  ceFAZolin (ANCEF) IVPB 2g/100 mL premix  Status:  Discontinued        2 g 200 mL/hr over 30 Minutes Intravenous Every 12 hours 10/23/20 1406 10/24/20 0804   10/22/20 1000  ceFAZolin (ANCEF) IVPB 1 g/50 mL premix  Status:  Discontinued        1 g 100 mL/hr over 30 Minutes Intravenous Every 12 hours 11-03-20 1745 10/23/20 1406   Nov 03, 2020 2200  ceFAZolin (ANCEF) IVPB 2g/100 mL premix        2 g 200 mL/hr over 30 Minutes Intravenous  Once November 03, 2020 1739 11/03/2020 2239   2020/11/03 2100  cefTRIAXone (ROCEPHIN) 2 g in sodium chloride 0.9 % 100 mL IVPB  Status:  Discontinued        2 g 200 mL/hr over 30 Minutes Intravenous Every 24 hours 11/03/2020 1117 11/03/2020 1129   November 03, 2020 1730  linezolid (ZYVOX) IVPB 600 mg  Status:  Discontinued        600 mg 300 mL/hr over 60 Minutes Intravenous Every 12 hours 11-03-2020 1154 Nov 03, 2020 1739   11-03-20 1130  cefTRIAXone (ROCEPHIN) 2 g in sodium chloride 0.9 % 100 mL IVPB  Status:  Discontinued        2 g 200 mL/hr over 30 Minutes Intravenous Every 24 hours 2020/11/03 1129 2020/11/03 1739   11-03-2020 1000  meropenem (MERREM) 500 mg in sodium chloride 0.9 % 100 mL IVPB  Status:  Discontinued        500 mg 200 mL/hr over 30 Minutes Intravenous Every 12 hours Nov 03, 2020 0330 11/03/20 1117   2020/11/03 0400  linezolid (ZYVOX) IVPB 600 mg        600 mg 300 mL/hr over 60 Minutes Intravenous  Once 11-03-2020 0309 Nov 03, 2020 0654   11-03-20 0215  ceFEPIme (MAXIPIME) 2 g in sodium chloride 0.9 % 100 mL IVPB        2 g 200 mL/hr over 30 Minutes Intravenous  Once 11-03-2020 0212 11/03/20 0306   Nov 03, 2020 0215  ampicillin-sulbactam (UNASYN) 1.5 g in sodium chloride 0.9 % 100 mL IVPB  Status:   Discontinued        1.5 g 200 mL/hr over 30 Minutes Intravenous  Once 03-Nov-2020 0212 Nov 03, 2020 0214   11/03/2020 0215  vancomycin (VANCOCIN) IVPB 1000 mg/200 mL premix  Status:  Discontinued        1,000 mg 200 mL/hr over 60 Minutes Intravenous  Once 03-Nov-2020 0212 2020/11/03 0314      Medications: Scheduled Meds: . brimonidine  1 drop Both Eyes TID  . Chlorhexidine Gluconate Cloth  6 each Topical Daily  . feeding supplement (PROSource TF)  45 mL Per Tube Daily  . free water  300 mL Per Tube Q4H  . heparin injection (subcutaneous)  5,000 Units Subcutaneous Q8H  . insulin aspart  0-15 Units Subcutaneous Q4H  . insulin aspart  5 Units Subcutaneous Q4H  . insulin glargine  20 Units Subcutaneous BID  . latanoprost  1 drop Both Eyes QHS  . metoprolol tartrate  25 mg Per Tube Q8H  . pantoprazole (PROTONIX) IV  40 mg Intravenous QHS  . sodium chloride flush  5 mL Intracatheter Q8H   Continuous Infusions: . sodium chloride Stopped (10/25/20 0603)  .  ceFAZolin (ANCEF) IV 2 g (10/25/20 0849)  . feeding supplement (OSMOLITE 1.2 CAL) 1,000 mL (10/24/20 0711)   PRN Meds:.sodium chloride, acetaminophen, docusate, HYDROmorphone (DILAUDID) injection, LORazepam, metoprolol tartrate, oxyCODONE, polyethylene glycol    Objective: Weight change: -0.7 kg  Intake/Output Summary (Last 24 hours) at 10/25/2020 1042 Last data filed at 10/25/2020 0800 Gross per 24 hour  Intake 1962.24 ml  Output 2446 ml  Net -483.76 ml   Blood pressure 130/77, pulse (!) 129, temperature 98.2 F (36.8 C), temperature source Oral, resp. rate (!) 33, height 6' (1.829 m), weight 69.3 kg, SpO2 100 %. Temp:  [97.7 F (36.5 C)-98.6 F (37 C)] 98.2 F (36.8 C) (12/08 0711) Pulse Rate:  [66-142] 129 (12/08 0900) Resp:  [17-33] 33 (12/08 0600) BP: (106-150)/(55-99) 130/77 (12/08 0900) SpO2:  [95 %-100 %] 100 % (12/08 0600) Weight:  [69.3 kg] 69.3 kg (12/08 0332)  Physical Exam: General: Somnolent HEENT: anicteric sclera,  EOMI CVS tachycardic regular rate, normal  Chest: , no wheezing, no respiratory distress Abdomen: soft non-distended,  Extremities: Left hip with drain in place with brown material. Skin: no rashes Neuro: nonfocal  CBC:    BMET Recent Labs    10/24/20 0132 10/25/20 0543  NA 148* 153*  K 3.8 4.1  CL 116* 121*  CO2 22 23  GLUCOSE 372* 231*  BUN 64* 46*  CREATININE 1.80* 1.23  CALCIUM 8.1* 8.3*     Liver Panel  Recent Labs    10/25/20 0543  PROT 6.8  ALBUMIN 1.5*  AST 153*  ALT 45*  ALKPHOS 188*  BILITOT 0.8       Sedimentation Rate No results for input(s): ESRSEDRATE in the last 72 hours. C-Reactive Protein No results for input(s): CRP in the last 72 hours.  Micro Results: Recent Results (from the past 720 hour(s))  Culture, blood (Routine x 2)     Status: Abnormal   Collection Time: 10/28/2020  1:00 AM   Specimen: BLOOD  Result Value Ref Range Status   Specimen Description BLOOD SITE NOT SPECIFIED  Final   Special Requests   Final    BOTTLES DRAWN AEROBIC AND ANAEROBIC Blood Culture adequate volume   Culture  Setup Time   Final    GRAM POSITIVE COCCI IN CLUSTERS IN BOTH AEROBIC AND ANAEROBIC BOTTLES CRITICAL VALUE NOTED.  VALUE IS CONSISTENT WITH PREVIOUSLY REPORTED AND CALLED VALUE.    Culture (A)  Final    STAPHYLOCOCCUS AUREUS SUSCEPTIBILITIES PERFORMED ON PREVIOUS CULTURE WITHIN THE LAST 5 DAYS. Performed at [facility], [address], [place], [place] [address]    Report Status 10/23/2020 FINAL  Final  Culture, blood (Routine x 2)     Status: Abnormal   Collection Time: 11/05/2020  1:00 AM   Specimen: BLOOD  Result Value Ref Range Status   Specimen Description BLOOD SITE NOT SPECIFIED  Final   Special Requests   Final    BOTTLES DRAWN AEROBIC AND ANAEROBIC Blood Culture results may not be optimal due to an inadequate volume of blood received in culture bottles   Culture  Setup Time   Final    GRAM POSITIVE COCCI IN CLUSTERS IN BOTH  AEROBIC AND ANAEROBIC BOTTLES CRITICAL RESULT CALLED TO, READ BACK BY AND VERIFIED WITH: E MARTEN,PHARMD AT 1717 11/09/2020 BY L BENFIELD Performed at [facility], [address] [place], [place] [address]  Culture STAPHYLOCOCCUS AUREUS (A)  Final   Report Status 10/23/2020 FINAL  Final   Organism ID, Bacteria STAPHYLOCOCCUS AUREUS  Final      Susceptibility   Staphylococcus aureus - MIC*    CIPROFLOXACIN >=8 RESISTANT Resistant     ERYTHROMYCIN <=0.25 SENSITIVE Sensitive     GENTAMICIN <=0.5 SENSITIVE Sensitive     OXACILLIN 0.5 SENSITIVE Sensitive     TETRACYCLINE <=1 SENSITIVE Sensitive     VANCOMYCIN <=0.5 SENSITIVE Sensitive     TRIMETH/SULFA <=10 SENSITIVE Sensitive     CLINDAMYCIN <=0.25 SENSITIVE Sensitive     RIFAMPIN <=0.5 SENSITIVE Sensitive     Inducible Clindamycin NEGATIVE Sensitive     * STAPHYLOCOCCUS AUREUS  Blood Culture ID Panel (Reflexed)     Status: Abnormal   Collection Time: 11/09/2020  1:00 AM  Result Value Ref Range Status   Enterococcus faecalis NOT DETECTED NOT DETECTED Final   Enterococcus Faecium NOT DETECTED NOT DETECTED Final   Listeria monocytogenes NOT DETECTED NOT DETECTED Final   Staphylococcus species DETECTED (A) NOT DETECTED Final    Comment: CRITICAL RESULT CALLED TO, READ BACK BY AND VERIFIED WITH: E MARTEN,PHARMD AT 1717 11/03/2020 BY L BENFIELD    Staphylococcus aureus (BCID) DETECTED (A) NOT DETECTED Final    Comment: CRITICAL RESULT CALLED TO, READ BACK BY AND VERIFIED WITH: E MARTEN,PHARMD AT 1717 11/06/2020 BY L BENFIELD    Staphylococcus epidermidis NOT DETECTED NOT DETECTED Final   Staphylococcus lugdunensis NOT DETECTED NOT DETECTED Final   Streptococcus species NOT DETECTED NOT DETECTED Final   Streptococcus agalactiae NOT DETECTED NOT DETECTED Final   Streptococcus pneumoniae NOT DETECTED NOT DETECTED Final   Streptococcus pyogenes NOT DETECTED NOT DETECTED Final   A.calcoaceticus-baumannii NOT DETECTED NOT DETECTED Final    Bacteroides fragilis NOT DETECTED NOT DETECTED Final   Enterobacterales NOT DETECTED NOT DETECTED Final   Enterobacter cloacae complex NOT DETECTED NOT DETECTED Final   Escherichia coli NOT DETECTED NOT DETECTED Final   Klebsiella aerogenes NOT DETECTED NOT DETECTED Final   Klebsiella oxytoca NOT DETECTED NOT DETECTED Final   Klebsiella pneumoniae NOT DETECTED NOT DETECTED Final   Proteus species NOT DETECTED NOT DETECTED Final   Salmonella species NOT DETECTED NOT DETECTED Final   Serratia marcescens NOT DETECTED NOT DETECTED Final   Haemophilus influenzae NOT DETECTED NOT DETECTED Final   Neisseria meningitidis NOT DETECTED NOT DETECTED Final   Pseudomonas aeruginosa NOT DETECTED NOT DETECTED Final   Stenotrophomonas maltophilia NOT DETECTED NOT DETECTED Final   Candida albicans NOT DETECTED NOT DETECTED Final   Candida auris NOT DETECTED NOT DETECTED Final   Candida glabrata NOT DETECTED NOT DETECTED Final   Candida krusei NOT DETECTED NOT DETECTED Final   Candida parapsilosis NOT DETECTED NOT DETECTED Final   Candida tropicalis NOT DETECTED NOT DETECTED Final   Cryptococcus neoformans/gattii NOT DETECTED NOT DETECTED Final   Meth resistant mecA/C and MREJ NOT DETECTED NOT DETECTED Final    Comment: Performed at [facility], [address], [place], [place] [address]  Resp Panel by RT-PCR (Flu A&B, Covid) Nasopharyngeal Swab     Status: None   Collection Time: 10/29/2020  2:46 AM   Specimen: Nasopharyngeal Swab; Nasopharyngeal(NP) swabs in vial transport medium  Result Value Ref Range Status   SARS Coronavirus 2 by RT PCR NEGATIVE NEGATIVE Final    Comment: (NOTE) SARS-CoV-2 target nucleic acids are NOT DETECTED.  The SARS-CoV-2 RNA is generally detectable in upper respiratory specimens during the acute phase of  infection. The lowest concentration of SARS-CoV-2 viral copies this assay can detect is 138 copies/mL. A negative result does not preclude SARS-Cov-2 infection and  should not be used as the sole basis for treatment or other patient management decisions. A negative result may occur with  improper specimen collection/handling, submission of specimen other than nasopharyngeal swab, presence of viral mutation(s) within the areas targeted by this assay, and inadequate number of viral copies(<138 copies/mL). A negative result must be combined with clinical observations, patient history, and epidemiological information. The expected result is Negative.  Fact Sheet for Patients:  BloggerCourse.com  Fact Sheet for Healthcare Providers:  SeriousBroker.it  This test is no t yet approved or cleared by the [place] FDA and  has been authorized for detection and/or diagnosis of SARS-CoV-2 by FDA under an Emergency Use Authorization (EUA). This EUA will remain  in effect (meaning this test can be used) for the duration of the COVID-19 declaration under Section 564(b)(1) of the Act, 21 U.S.C.section 360bbb-3(b)(1), unless the authorization is terminated  or revoked sooner.       Influenza A by PCR NEGATIVE NEGATIVE Final   Influenza B by PCR NEGATIVE NEGATIVE Final    Comment: (NOTE) The Xpert Xpress SARS-CoV-2/FLU/RSV plus assay is intended as an aid in the diagnosis of influenza from Nasopharyngeal swab specimens and should not be used as a sole basis for treatment. Nasal washings and aspirates are unacceptable for Xpert Xpress SARS-CoV-2/FLU/RSV testing.  Fact Sheet for Patients: BloggerCourse.com  Fact Sheet for Healthcare Providers: SeriousBroker.it  This test is not yet approved or cleared by the [place] FDA and has been authorized for detection and/or diagnosis of SARS-CoV-2 by FDA under an Emergency Use Authorization (EUA). This EUA will remain in effect (meaning this test can be used) for the duration of the COVID-19 declaration  under Section 564(b)(1) of the Act, 21 U.S.C. section 360bbb-3(b)(1), unless the authorization is terminated or revoked.  Performed at [facility], [address], [place], [place] [address]   Urine culture     Status: Abnormal   Collection Time: 11/03/2020  2:48 AM   Specimen: In/Out Cath Urine  Result Value Ref Range Status   Specimen Description IN/OUT CATH URINE  Final   Special Requests   Final    NONE Performed at [facility], [address], [place], [place] [address]    Culture >=100,000 COLONIES/mL STAPHYLOCOCCUS AUREUS (A)  Final   Report Status 10/23/2020 FINAL  Final   Organism ID, Bacteria STAPHYLOCOCCUS AUREUS (A)  Final      Susceptibility   Staphylococcus aureus - MIC*    CIPROFLOXACIN >=8 RESISTANT Resistant     GENTAMICIN <=0.5 SENSITIVE Sensitive     NITROFURANTOIN 32 SENSITIVE Sensitive     OXACILLIN 0.5 SENSITIVE Sensitive     TETRACYCLINE <=1 SENSITIVE Sensitive     VANCOMYCIN <=0.5 SENSITIVE Sensitive     TRIMETH/SULFA <=10 SENSITIVE Sensitive     CLINDAMYCIN <=0.25 SENSITIVE Sensitive     RIFAMPIN <=0.5 SENSITIVE Sensitive     Inducible Clindamycin NEGATIVE Sensitive     * >=100,000 COLONIES/mL STAPHYLOCOCCUS AUREUS  Aerobic/Anaerobic Culture (surgical/deep wound)     Status: None (Preliminary result)   Collection Time: 10/18/2020  1:47 PM   Specimen: Abscess  Result Value Ref Range Status   Specimen Description ABSCESS  Final   Special Requests LEFT THIGH/HIP ABSC  Final   Gram Stain   Final    FEW WBC PRESENT, PREDOMINANTLY PMN  MODERATE GRAM POSITIVE COCCI IN PAIRS IN CLUSTERS Performed at [facility], [address] [place], [place] [address]    Culture   Final    ABUNDANT STAPHYLOCOCCUS AUREUS NO ANAEROBES ISOLATED; CULTURE IN PROGRESS FOR 5 DAYS    Report Status PENDING  Incomplete   Organism ID, Bacteria STAPHYLOCOCCUS AUREUS  Final      Susceptibility   Staphylococcus aureus - MIC*    CIPROFLOXACIN >=8 RESISTANT  Resistant     ERYTHROMYCIN <=0.25 SENSITIVE Sensitive     GENTAMICIN <=0.5 SENSITIVE Sensitive     OXACILLIN 0.5 SENSITIVE Sensitive     TETRACYCLINE <=1 SENSITIVE Sensitive     VANCOMYCIN <=0.5 SENSITIVE Sensitive     TRIMETH/SULFA <=10 SENSITIVE Sensitive     CLINDAMYCIN <=0.25 SENSITIVE Sensitive     RIFAMPIN <=0.5 SENSITIVE Sensitive     Inducible Clindamycin NEGATIVE Sensitive     * ABUNDANT STAPHYLOCOCCUS AUREUS  MRSA PCR Screening     Status: None   Collection Time: 10/19/2020  3:12 PM   Specimen: Nasopharyngeal  Result Value Ref Range Status   MRSA by PCR NEGATIVE NEGATIVE Final    Comment:        The GeneXpert MRSA Assay (FDA approved for NASAL specimens only), is one component of a comprehensive MRSA colonization surveillance program. It is not intended to diagnose MRSA infection nor to guide or monitor treatment for MRSA infections. Performed at [facility], [address] [place], [place] [address]   Culture, blood (routine x 2)     Status: None (Preliminary result)   Collection Time: 10/22/20 11:40 AM   Specimen: BLOOD LEFT ARM  Result Value Ref Range Status   Specimen Description BLOOD LEFT ARM  Final   Special Requests   Final    BOTTLES DRAWN AEROBIC AND ANAEROBIC Blood Culture adequate volume   Culture   Final    NO GROWTH 3 DAYS Performed at [facility], [address] [place], [place] [address]    Report Status PENDING  Incomplete  Culture, blood (routine x 2)     Status: None (Preliminary result)   Collection Time: 10/22/20 11:55 AM   Specimen: BLOOD LEFT HAND  Result Value Ref Range Status   Specimen Description BLOOD LEFT HAND  Final   Special Requests   Final    BOTTLES DRAWN AEROBIC AND ANAEROBIC Blood Culture adequate volume   Culture   Final    NO GROWTH 3 DAYS Performed at [facility], [address] [place], [place] [address]    Report Status PENDING  Incomplete    Studies/Results: MR BRAIN WO CONTRAST  Result  Date: 10/24/2020 CLINICAL DATA:  Initial evaluation for brain abscess, history of MSSA bacteremia. EXAM: MRI HEAD WITHOUT CONTRAST TECHNIQUE: Multiplanar, multiecho pulse sequences of the brain and surrounding structures were obtained without intravenous contrast. COMPARISON:  Prior CT from 08/06/2020. FINDINGS: Brain: Diffuse prominence of the CSF containing spaces compatible generalized age-related cerebral atrophy. Patchy and confluent T2/FLAIR hyperintensity within the periventricular and deep white matter both cerebral hemispheres most consistent with chronic small vessel ischemic disease, moderate in nature. Extensive encephalomalacia and gliosis involving the right cerebral hemisphere consistent with a chronic right MCA distribution infarct. Associated wallerian degeneration extends into the right cerebral peduncle and midbrain/pons. No abnormal foci of restricted diffusion to suggest acute or subacute ischemia. No diffusion abnormality to suggest frank intraparenchymal abscess. Gray-white matter differentiation otherwise maintained. No evidence for acute or chronic intracranial  hemorrhage. No mass lesion, midline shift or mass effect. Ex vacuo dilatation of the right lateral ventricle related to the chronic right MCA distribution infarct. No hydrocephalus. No extra-axial fluid collection. Pituitary gland suprasellar region normal. Midline structures intact. Vascular: Attenuated flow void within the right MCA territory related to the chronic right MCA territory infarct. Major intracranial vascular flow voids are otherwise maintained. Skull and upper cervical spine: Craniocervical junction within normal limits. Visualized upper cervical spine normal. Bone marrow signal intensity normal. No scalp soft tissue abnormality. Sinuses/Orbits: Patient status post bilateral ocular lens replacement. Globes and orbital soft tissues demonstrate no acute finding. Mild chronic mucosal thickening noted within the ethmoidal  air cells and maxillary sinuses. No air-fluid levels to suggest acute sinusitis. Mastoid air cells are largely clear. Inner ear structures grossly normal. Other: None. IMPRESSION: 1. No acute intracranial abnormality. Specifically, no evidence for intracranial abscess or other infection. 2. Chronic right MCA distribution infarct. 3. 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 Electronically Signed   By: Rise MuBenjamin  McClintock M.D.   On: 10/24/2020 19:27   ECHOCARDIOGRAM COMPLETE  Result Date: 10/23/2020    ECHOCARDIOGRAM REPORT   Patient Name:   Denton LankSamuel J Pensabene Jr. Date of Exam: 10/23/2020 Medical Rec #:  [id number]         Height:       72.0 in Accession #:    [id number]        Weight:       155.2 lb Date of Birth:  08/15/1941          BSA:          1.913 m Patient Age:    79 years          BP:           135/78 mmHg Patient Gender: M                 HR:           136 bpm. Exam Location:  Inpatient Procedure: 2D Echo, Cardiac Doppler and Color Doppler Indications:    Bacteremia.  History:        Patient has no prior history of Echocardiogram examinations.                 Abnormal ECG and Ventricular bigeminy; Risk                 Factors:Hypertension, Diabetes, Dyslipidemia and Former Smoker.  Sonographer:    Sheralyn Boatmanina West RDCS Referring Phys: [id number] BRADLEY L ICARD IMPRESSIONS  1. Very difficult study due to intermittent tachycardia and intermittent ventricular bigeminy. EF appears low normal 50-55%. Left ventricular ejection fraction, by estimation, is 50 to 55%. The left ventricle has low normal function. The left ventricle has no regional wall motion abnormalities. Left ventricular diastolic function could not be evaluated.  2. Right ventricular systolic function is normal. The right ventricular size is normal. Tricuspid regurgitation signal is inadequate for assessing PA pressure.  3. The mitral valve is grossly normal. No evidence of mitral valve regurgitation. No  evidence of mitral stenosis.  4. The NCC contains focal calcification. There is no regurgitation to suggest this is vegetation. The aortic valve is tricuspid. There is mild calcification of the aortic valve. Aortic valve regurgitation is not visualized. Mild aortic valve sclerosis is present, with no evidence of aortic valve stenosis.  5. The inferior vena cava is normal in size with greater than 50% respiratory variability, suggesting right atrial  pressure of 3 mmHg. Conclusion(s)/Recommendation(s): No evidence of valvular vegetations on this transthoracic echocardiogram. Would recommend a transesophageal echocardiogram to exclude infective endocarditis if clinically indicated. FINDINGS  Left Ventricle: Very difficult study due to intermittent tachycardia and intermittent ventricular bigeminy. EF appears low normal 50-55%. Left ventricular ejection fraction, by estimation, is 50 to 55%. The left ventricle has low normal function. The left ventricle has no regional wall motion abnormalities. The left ventricular internal cavity size was normal in size. There is no left ventricular hypertrophy. Left ventricular diastolic function could not be evaluated due to nondiagnostic images. Left  ventricular diastolic function could not be evaluated. Right Ventricle: The right ventricular size is normal. No increase in right ventricular wall thickness. Right ventricular systolic function is normal. Tricuspid regurgitation signal is inadequate for assessing PA pressure. Left Atrium: Left atrial size was normal in size. Right Atrium: Right atrial size was normal in size. Pericardium: There is no evidence of pericardial effusion. Mitral Valve: The mitral valve is grossly normal. No evidence of mitral valve regurgitation. No evidence of mitral valve stenosis. Tricuspid Valve: The tricuspid valve is grossly normal. Tricuspid valve regurgitation is trivial. No evidence of tricuspid stenosis. Aortic Valve: The NCC contains focal  calcification. There is no regurgitation to suggest this is vegetation. The aortic valve is tricuspid. There is mild calcification of the aortic valve. Aortic valve regurgitation is not visualized. Mild aortic valve sclerosis is present, with no evidence of aortic valve stenosis. Pulmonic Valve: The pulmonic valve was grossly normal. Pulmonic valve regurgitation is not visualized. No evidence of pulmonic stenosis. Aorta: The aortic root and ascending aorta are structurally normal, with no evidence of dilitation. Venous: The inferior vena cava is normal in size with greater than 50% respiratory variability, suggesting right atrial pressure of 3 mmHg. IAS/Shunts: The atrial septum is grossly normal. Additional Comments: There is a small pleural effusion in the left lateral region.  LEFT VENTRICLE PLAX 2D LVIDd:         4.50 cm LVIDs:         3.20 cm LV PW:         1.30 cm LV IVS:        0.90 cm LVOT diam:     2.00 cm LV SV:         57 LV SV Index:   30 LVOT Area:     3.14 cm  LV Volumes (MOD) LV vol d, MOD A2C: 42.9 ml LV vol d, MOD A4C: 62.1 ml LV vol s, MOD A2C: 27.5 ml LV vol s, MOD A4C: 24.8 ml LV SV MOD A2C:     15.4 ml LV SV MOD A4C:     62.1 ml LV SV MOD BP:      25.9 ml RIGHT VENTRICLE            IVC RV S prime:     9.32 cm/s  IVC diam: 1.80 cm TAPSE (M-mode): 1.4 cm LEFT ATRIUM             Index       RIGHT ATRIUM           Index LA diam:        2.50 cm 1.31 cm/m  RA Area:     11.40 cm LA Vol (A2C):   16.2 ml 8.47 ml/m  RA Volume:   25.00 ml  13.07 ml/m LA Vol (A4C):   20.0 ml 10.46 ml/m LA Biplane Vol: 18.8 ml 9.83 ml/m  AORTIC VALVE  LVOT Vmax:   99.20 cm/s LVOT Vmean:  70.000 cm/s LVOT VTI:    0.183 m  AORTA Ao Root diam: 3.30 cm Ao Asc diam:  3.00 cm MITRAL VALVE MV Area (PHT): 4.15 cm    SHUNTS MV Decel Time: 183 msec    Systemic VTI:  0.18 m MV E velocity: 67.50 cm/s  Systemic Diam: 2.00 cm MV A velocity: 88.70 cm/s MV E/A ratio:  0.76 Lennie Odor MD Electronically signed by Lennie Odor MD  Signature Date/Time: 10/23/2020/11:56:59 AM    Final       Assessment/Plan:  INTERVAL HISTORY:   MRI of the brain without evidence of septic emboli   Principal Problem:   MSSA bacteremia Active Problems:   Protein-calorie malnutrition, severe (HCC)   Severe sepsis (HCC)   Abscess of left hip   Hypernatremia    Marcus Downs. is a 79 y.o. male with with MSSA bacteremia and infected left total hip arthroplasty status post IR guided aspirate of fluid is of course also growing staph aureus repeat blood cultures been taken  Orthopedics do not feel that he is a good operative candidate at this point in time and that he might in fact require a [place] for control of the infection operatively  2D echocardiogram has been done and mentions some ossifications involving the aortic valve and I believe the mitral valve.  There is not an obvious vegetation.  Repeat blood cultures been taken and negative to date  MRI brain with infarct but not an embolic one  Continue cefazolin   We will plan on adding rifampin once his blood cultures clear.  He will need 2 weeks of cefazolin and oral rifampin followed by indefinite Keflex and rifampin.      LOS: 4 days   Acey Lav 10/25/2020, 10:42 AM

## 2020-10-25 NOTE — Progress Notes (Addendum)
Inpatient Diabetes Program Recommendations  AACE/ADA: New Consensus Statement on Inpatient Glycemic Control (2015)  Target Ranges:  Prepandial:   less than 140 mg/dL      Peak postprandial:   less than 180 mg/dL (1-2 hours)      Critically ill patients:  140 - 180 mg/dL   Lab Results  Component Value Date   GLUCAP 215 (H) 10/25/2020   HGBA1C 8.1 (H) 11-02-2020    Review of Glycemic Control Results for Marcus Downs, Marcus Downs (MRN [id number]) as of 10/25/2020 10:56  Ref. Range 10/24/2020 15:02 10/24/2020 19:13 10/24/2020 23:05 10/25/2020 03:06 10/25/2020 07:10  Glucose-Capillary Latest Ref Range: 70 - 99 mg/dL 409 (H) 811 (H) 914 (H) 229 (H) 215 (H)   Current orders for Inpatient glycemic control:  Lantus 20 units BID Novolog 0-15 units Q4H Novolog 5 units Q4H Osmolite 1.5 @ 65 ml/hr  Inpatient Diabetes Program Recommendations:    -Lantus 25 units BID (could give 1x dose of 5 units now) -Novolog 8 units Q4H tube feed coverage.  Stop if feeds are held or discontinued.  Will continue to follow while inpatient.  Thank you, Dulce Sellar, RN, BSN Diabetes Coordinator Inpatient Diabetes Program [phone] (team pager from 8a-5p)

## 2020-10-26 ENCOUNTER — Encounter (HOSPITAL_COMMUNITY): Payer: Self-pay | Admitting: Orthopedic Surgery

## 2020-10-26 DIAGNOSIS — E860 Dehydration: Secondary | ICD-10-CM

## 2020-10-26 DIAGNOSIS — T8459XA Infection and inflammatory reaction due to other internal joint prosthesis, initial encounter: Secondary | ICD-10-CM | POA: Diagnosis not present

## 2020-10-26 DIAGNOSIS — T8459XD Infection and inflammatory reaction due to other internal joint prosthesis, subsequent encounter: Secondary | ICD-10-CM

## 2020-10-26 DIAGNOSIS — B9561 Methicillin susceptible Staphylococcus aureus infection as the cause of diseases classified elsewhere: Secondary | ICD-10-CM | POA: Diagnosis not present

## 2020-10-26 DIAGNOSIS — L0291 Cutaneous abscess, unspecified: Secondary | ICD-10-CM

## 2020-10-26 DIAGNOSIS — R6520 Severe sepsis without septic shock: Secondary | ICD-10-CM | POA: Diagnosis not present

## 2020-10-26 DIAGNOSIS — A4101 Sepsis due to Methicillin susceptible Staphylococcus aureus: Secondary | ICD-10-CM | POA: Diagnosis not present

## 2020-10-26 DIAGNOSIS — Z4659 Encounter for fitting and adjustment of other gastrointestinal appliance and device: Secondary | ICD-10-CM

## 2020-10-26 DIAGNOSIS — E875 Hyperkalemia: Secondary | ICD-10-CM

## 2020-10-26 DIAGNOSIS — Z96649 Presence of unspecified artificial hip joint: Secondary | ICD-10-CM

## 2020-10-26 LAB — COMPREHENSIVE METABOLIC PANEL
ALT: 47 U/L — ABNORMAL HIGH (ref 0–44)
AST: 132 U/L — ABNORMAL HIGH (ref 15–41)
Albumin: 1.3 g/dL — ABNORMAL LOW (ref 3.5–5.0)
Alkaline Phosphatase: 189 U/L — ABNORMAL HIGH (ref 38–126)
Anion gap: 9 (ref 5–15)
BUN: 33 mg/dL — ABNORMAL HIGH (ref 8–23)
CO2: 23 mmol/L (ref 22–32)
Calcium: 8.2 mg/dL — ABNORMAL LOW (ref 8.9–10.3)
Chloride: 118 mmol/L — ABNORMAL HIGH (ref 98–111)
Creatinine, Ser: 1.07 mg/dL (ref 0.61–1.24)
GFR, Estimated: >60 mL/min (ref >60)
Glucose, Bld: 272 mg/dL — ABNORMAL HIGH (ref 70–99)
Potassium: 4.8 mmol/L (ref 3.5–5.1)
Sodium: 150 mmol/L — ABNORMAL HIGH (ref 135–145)
Total Bilirubin: 0.5 mg/dL (ref 0.3–1.2)
Total Protein: 6.8 g/dL (ref 6.5–8.1)

## 2020-10-26 LAB — CBC WITH DIFFERENTIAL/PLATELET
Abs Immature Granulocytes: 0.05 10*3/uL (ref 0.00–0.07)
Basophils Absolute: 0 10*3/uL (ref 0.0–0.1)
Basophils Relative: 0 %
Eosinophils Absolute: 0.3 10*3/uL (ref 0.0–0.5)
Eosinophils Relative: 4 %
HCT: 31.1 % — ABNORMAL LOW (ref 39.0–52.0)
Hemoglobin: 10.8 g/dL — ABNORMAL LOW (ref 13.0–17.0)
Immature Granulocytes: 1 %
Lymphocytes Relative: 18 %
Lymphs Abs: 1.6 10*3/uL (ref 0.7–4.0)
MCH: 26.4 pg (ref 26.0–34.0)
MCHC: 34.7 g/dL (ref 30.0–36.0)
MCV: 76 fL — ABNORMAL LOW (ref 80.0–100.0)
Monocytes Absolute: 0.4 10*3/uL (ref 0.1–1.0)
Monocytes Relative: 5 %
Neutro Abs: 6.5 10*3/uL (ref 1.7–7.7)
Neutrophils Relative %: 72 %
Platelets: 155 10*3/uL (ref 150–400)
RBC: 4.09 MIL/uL — ABNORMAL LOW (ref 4.22–5.81)
RDW: 18.6 % — ABNORMAL HIGH (ref 11.5–15.5)
WBC: 8.9 10*3/uL (ref 4.0–10.5)
nRBC: 0 % (ref 0.0–0.2)

## 2020-10-26 LAB — AEROBIC/ANAEROBIC CULTURE W GRAM STAIN (SURGICAL/DEEP WOUND)

## 2020-10-26 LAB — GLUCOSE, CAPILLARY
Glucose-Capillary: 303 mg/dL — ABNORMAL HIGH (ref 70–99)
Glucose-Capillary: 245 mg/dL — ABNORMAL HIGH (ref 70–99)
Glucose-Capillary: 143 mg/dL — ABNORMAL HIGH (ref 70–99)
Glucose-Capillary: 93 mg/dL (ref 70–99)
Glucose-Capillary: 103 mg/dL — ABNORMAL HIGH (ref 70–99)
Glucose-Capillary: 144 mg/dL — ABNORMAL HIGH (ref 70–99)

## 2020-10-26 MED ORDER — METOPROLOL TARTRATE 25 MG/10 ML ORAL SUSPENSION
75.0000 mg | Freq: Three times a day (TID) | ORAL | Status: DC
  Administered 2020-10-26: 75 mg
  Filled 2020-10-26 (×2): qty 30

## 2020-10-26 MED ORDER — INSULIN GLARGINE 100 UNIT/ML ~~LOC~~ SOLN
30.0000 [IU] | Freq: Two times a day (BID) | SUBCUTANEOUS | Status: DC
  Administered 2020-10-26: 30 [IU] via SUBCUTANEOUS
  Filled 2020-10-26 (×3): qty 0.3

## 2020-10-26 MED ORDER — INSULIN ASPART 100 UNIT/ML ~~LOC~~ SOLN
8.0000 [IU] | SUBCUTANEOUS | Status: DC
  Administered 2020-10-26 – 2020-10-27 (×3): 8 [IU] via SUBCUTANEOUS

## 2020-10-26 MED ORDER — SODIUM CHLORIDE 0.9 % IV SOLN
300.0000 mg | Freq: Two times a day (BID) | INTRAVENOUS | Status: DC
  Administered 2020-10-26 – 2020-11-06 (×23): 300 mg via INTRAVENOUS
  Filled 2020-10-26 (×26): qty 300

## 2020-10-26 MED ORDER — METOPROLOL TARTRATE 5 MG/5ML IV SOLN
5.0000 mg | Freq: Once | INTRAVENOUS | Status: AC
  Administered 2020-10-26: 5 mg via INTRAVENOUS

## 2020-10-26 NOTE — Progress Notes (Signed)
Pt MEWS red/yellow per shift as with previous shifts. PRN med given for HR. Medical team is aware.

## 2020-10-26 NOTE — Progress Notes (Signed)
ORTHO  AFEB and WBC wnl  Drainage decreasing  Out of ICU  Leg still tender to ROM but less so  PLAN is to remove drain when output is scant and follow ID recs of 6 weeks IV cefazolin and chronic keflex thereafter.

## 2020-10-26 NOTE — Plan of Care (Signed)
  Problem: Education: Goal: Knowledge of General Education information will improve Description: Including pain rating scale, medication(s)/side effects and non-pharmacologic comfort measures Outcome: Not Progressing   Problem: Health Behavior/Discharge Planning: Goal: Ability to manage health-related needs will improve Outcome: Not Progressing   Problem: Clinical Measurements: Goal: Ability to maintain clinical measurements within normal limits will improve Outcome: Not Progressing Goal: Will remain free from infection Outcome: Not Progressing Goal: Diagnostic test results will improve Outcome: Not Progressing Goal: Respiratory complications will improve Outcome: Not Progressing Goal: Cardiovascular complication will be avoided Outcome: Not Progressing   Problem: Activity: Goal: Risk for activity intolerance will decrease Outcome: Not Progressing   Problem: Nutrition: Goal: Adequate nutrition will be maintained Outcome: Not Progressing   Problem: Coping: Goal: Level of anxiety will decrease Outcome: Not Progressing   Problem: Elimination: Goal: Will not experience complications related to bowel motility Outcome: Not Progressing Goal: Will not experience complications related to urinary retention Outcome: Not Progressing   Problem: Pain Managment: Goal: General experience of comfort will improve Outcome: Not Progressing   Problem: Safety: Goal: Ability to remain free from injury will improve Outcome: Not Progressing   Problem: Skin Integrity: Goal: Risk for impaired skin integrity will decrease Outcome: Not Progressing   Problem: Nutrition Goal: Patient maintains adequate hydration Outcome: Not Progressing Goal: Patient maintains weight Outcome: Not Progressing Goal: Patient/Family demonstrates understanding of diet Outcome: Not Progressing Goal: Patient/Family independently completes tube feeding Outcome: Not Progressing Goal: Patient will have no more  than 5 lb weight change during LOS Outcome: Not Progressing Goal: Patient will utilize adaptive techniques to administer nutrition Outcome: Not Progressing Goal: Patient will verbalize dietary restrictions Outcome: Not Progressing   Problem: Urinary Elimination: Goal: Signs and symptoms of infection will decrease Outcome: Not Progressing

## 2020-10-26 NOTE — Plan of Care (Signed)
  Problem: Education: Goal: Knowledge of General Education information will improve Description: Including pain rating scale, medication(s)/side effects and non-pharmacologic comfort measures Outcome: Progressing   Problem: Health Behavior/Discharge Planning: Goal: Ability to manage health-related needs will improve Outcome: Progressing   Problem: Clinical Measurements: Goal: Ability to maintain clinical measurements within normal limits will improve Outcome: Progressing Goal: Will remain free from infection Outcome: Progressing Goal: Diagnostic test results will improve Outcome: Progressing Goal: Respiratory complications will improve Outcome: Progressing Goal: Cardiovascular complication will be avoided Outcome: Progressing   Problem: Activity: Goal: Risk for activity intolerance will decrease Outcome: Progressing   Problem: Nutrition: Goal: Adequate nutrition will be maintained Outcome: Progressing   Problem: Coping: Goal: Level of anxiety will decrease Outcome: Progressing   Problem: Elimination: Goal: Will not experience complications related to bowel motility Outcome: Progressing Goal: Will not experience complications related to urinary retention Outcome: Progressing   Problem: Pain Managment: Goal: General experience of comfort will improve Outcome: Progressing   Problem: Safety: Goal: Ability to remain free from injury will improve Outcome: Progressing   Problem: Skin Integrity: Goal: Risk for impaired skin integrity will decrease Outcome: Progressing   Problem: Nutrition Goal: Patient maintains adequate hydration Outcome: Progressing Goal: Patient maintains weight Outcome: Progressing Goal: Patient/Family demonstrates understanding of diet Outcome: Progressing Goal: Patient/Family independently completes tube feeding Outcome: Progressing Goal: Patient will have no more than 5 lb weight change during LOS Outcome: Progressing Goal: Patient will  utilize adaptive techniques to administer nutrition Outcome: Progressing Goal: Patient will verbalize dietary restrictions Outcome: Progressing   Problem: Urinary Elimination: Goal: Signs and symptoms of infection will decrease Outcome: Progressing   Problem: Safety: Goal: Non-violent Restraint(s) Outcome: Progressing

## 2020-10-26 NOTE — Progress Notes (Signed)
Referring Physician(s): Noralee Stainhoi, Jennifer [facility])  Supervising Physician: Oley BalmHassell, Daniel  [facility]  [facility]  Chief Complaint: None- lethargic  Subjective:  History of sepsis presumed secondary to left thigh/hip abscess s/p left thigh/hip drain in IR 11/15/2020. Patient laying in bed resting comfortably. Appears lethargic- does not open eyes to voice and does not follow simple commands. Left thigh/hip drain site c/d/i.   Allergies: Tape  Medications: Prior to Admission medications   Medication Sig Start Date End Date Taking? Authorizing Provider  acetaminophen (TYLENOL) 325 MG tablet Take 650 mg by mouth every 6 (six) hours as needed for mild pain or fever. 08/14/20  Yes [provider]  Amino Acids-Protein Hydrolys (FEEDING SUPPLEMENT, PRO-STAT 64,) LIQD Take 30 mLs by mouth in the morning and at bedtime. For wound healing 08/15/20  Yes [provider]  aspirin 325 MG tablet Take 325 mg by mouth daily.   Yes [provider]  atorvastatin (LIPITOR) 40 MG tablet Take 1 tablet (40 mg total) by mouth daily. 08/14/20  Yes Rhetta MuraSamtani, Jai-Gurmukh, MD  bisacodyl (DULCOLAX) 10 MG suppository Place 10 mg rectally as needed for moderate constipation.   Yes [provider]  brimonidine (ALPHAGAN P) 0.1 % SOLN Place 1 drop into both eyes 3 (three) times daily.   Yes [provider]  ferrous sulfate 325 (65 FE) MG tablet Take 325 mg by mouth daily.  08/31/18  Yes Eustaquio BoydenGutierrez, Javier, MD  Insulin Aspart Prot & Aspart (NOVOLOG MIX 70/30 FLEXPEN [place]) Inject 14-18 Units into the skin See admin instructions. Inject 14 Units after breakfast,  Inject 18 units after lunch, inject 16 uints at dinner.   Yes [provider]  latanoprost (XALATAN) 0.005 % ophthalmic solution Place 1 drop into both eyes at bedtime.   Yes [provider]  magnesium hydroxide (MILK OF MAGNESIA) 400 MG/5ML suspension Take 30 mLs by mouth daily as needed for mild  constipation.    Yes [provider]  metoprolol tartrate (LOPRESSOR) 25 MG tablet Take 1 tablet (25 mg total) by mouth 2 (two) times daily. 08/14/20  Yes Rhetta MuraSamtani, Jai-Gurmukh, MD  MULTIPLE VITAMIN PO Take 1 tablet by mouth daily. W/ Minerals to promote wound healing   Yes [provider]  NON FORMULARY 120mL Medpass TID d/t weight loss. 09/13/20  Yes [provider]  potassium chloride (KLOR-CON) 10 MEQ tablet TAKE 3 TABLETS BY MOUTH DAILY Patient taking differently: 30 mEq daily.  09/11/20  Yes Eustaquio BoydenGutierrez, Javier, MD  Sodium Phosphates (RA SALINE ENEMA RE) Place 1 enema rectally daily as needed (for constipation).   Yes [provider]  sodium chloride 0.9 % infusion Inject 1,000 mLs into the vein daily. 26300ml/ 1 hour    [provider]  sulfamethoxazole-trimethoprim (BACTRIM DS) 800-160 MG tablet Take 1 tablet by mouth 2 (two) times daily. For 10 days    [provider]  sulfamethoxazole-trimethoprim (BACTRIM) 400-80 MG tablet Take 1 tablet by mouth 2 (two) times daily. For 10 days    [provider]     Vital Signs: BP 139/65   Pulse (!) 131   Temp 98.4 F (36.9 C) (Oral)   Resp 20   Ht 6' (1.829 m)   Wt 153 lb 3.5 oz (69.5 kg)   SpO2 100%   BMI 20.78 kg/m   Physical Exam Vitals and nursing note reviewed.  Constitutional:      General: He is not in acute distress.    Comments: Lethargic.  Pulmonary:  Effort: Pulmonary effort is normal. No respiratory distress.  Musculoskeletal:     Comments: Left thigh/hip drain site without erythema, drainage, or active bleeding; approximately 15-20 cc bloody fluid in suction bulb; drain flushes/aspirates without resistance.  Skin:    General: Skin is warm and dry.  Neurological:     Comments: Lethargic- does not open eyes to voice and does not follow simple commands.     Imaging: MR BRAIN WO CONTRAST  Result Date: 10/24/2020 CLINICAL DATA:  Initial evaluation for brain  abscess, history of MSSA bacteremia. EXAM: MRI HEAD WITHOUT CONTRAST TECHNIQUE: Multiplanar, multiecho pulse sequences of the brain and surrounding structures were obtained without intravenous contrast. COMPARISON:  Prior CT from 08/06/2020. FINDINGS: Brain: Diffuse prominence of the CSF containing spaces compatible generalized age-related cerebral atrophy. Patchy and confluent T2/FLAIR hyperintensity within the periventricular and deep white matter both cerebral hemispheres most consistent with chronic small vessel ischemic disease, moderate in nature. Extensive encephalomalacia and gliosis involving the right cerebral hemisphere consistent with a chronic right MCA distribution infarct. Associated wallerian degeneration extends into the right cerebral peduncle and midbrain/pons. No abnormal foci of restricted diffusion to suggest acute or subacute ischemia. No diffusion abnormality to suggest frank intraparenchymal abscess. Gray-white matter differentiation otherwise maintained. No evidence for acute or chronic intracranial hemorrhage. No mass lesion, midline shift or mass effect. Ex vacuo dilatation of the right lateral ventricle related to the chronic right MCA distribution infarct. No hydrocephalus. No extra-axial fluid collection. Pituitary gland suprasellar region normal. Midline structures intact. Vascular: Attenuated flow void within the right MCA territory related to the chronic right MCA territory infarct. Major intracranial vascular flow voids are otherwise maintained. Skull and upper cervical spine: Craniocervical junction within normal limits. Visualized upper cervical spine normal. Bone marrow signal intensity normal. No scalp soft tissue abnormality. Sinuses/Orbits: Patient status post bilateral ocular lens replacement. Globes and orbital soft tissues demonstrate no acute finding. Mild chronic mucosal thickening noted within the ethmoidal air cells and maxillary sinuses. No air-fluid levels to suggest  acute sinusitis. Mastoid air cells are largely clear. Inner ear structures grossly normal. Other: None. IMPRESSION: 1. No acute intracranial abnormality. Specifically, no evidence for intracranial abscess or other infection. 2. Chronic right MCA distribution infarct. 3. 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 Electronically Signed   By: Rise Mu M.D.   On: 10/24/2020 19:27   ECHOCARDIOGRAM COMPLETE  Result Date: 10/23/2020    ECHOCARDIOGRAM REPORT   Patient Name:   Marcus Downs. Date of Exam: 10/23/2020 Medical Rec #:  [id number]         Height:       72.0 in Accession #:    [id number]        Weight:       155.2 lb Date of Birth:  08/20/1941          BSA:          1.913 m Patient Age:    79 years          BP:           135/78 mmHg Patient Gender: M                 HR:           136 bpm. Exam Location:  Inpatient Procedure: 2D Echo, Cardiac Doppler and Color Doppler Indications:    Bacteremia.  History:        Patient has no prior history of Echocardiogram examinations.  Abnormal ECG and Ventricular bigeminy; Risk                 Factors:Hypertension, Diabetes, Dyslipidemia and Former Smoker.  Sonographer:    Sheralyn Boatman RDCS Referring Phys: [id number] BRADLEY L ICARD IMPRESSIONS  1. Very difficult study due to intermittent tachycardia and intermittent ventricular bigeminy. EF appears low normal 50-55%. Left ventricular ejection fraction, by estimation, is 50 to 55%. The left ventricle has low normal function. The left ventricle has no regional wall motion abnormalities. Left ventricular diastolic function could not be evaluated.  2. Right ventricular systolic function is normal. The right ventricular size is normal. Tricuspid regurgitation signal is inadequate for assessing PA pressure.  3. The mitral valve is grossly normal. No evidence of mitral valve regurgitation. No evidence of mitral stenosis.  4. The NCC contains focal  calcification. There is no regurgitation to suggest this is vegetation. The aortic valve is tricuspid. There is mild calcification of the aortic valve. Aortic valve regurgitation is not visualized. Mild aortic valve sclerosis is present, with no evidence of aortic valve stenosis.  5. The inferior vena cava is normal in size with greater than 50% respiratory variability, suggesting right atrial pressure of 3 mmHg. Conclusion(s)/Recommendation(s): No evidence of valvular vegetations on this transthoracic echocardiogram. Would recommend a transesophageal echocardiogram to exclude infective endocarditis if clinically indicated. FINDINGS  Left Ventricle: Very difficult study due to intermittent tachycardia and intermittent ventricular bigeminy. EF appears low normal 50-55%. Left ventricular ejection fraction, by estimation, is 50 to 55%. The left ventricle has low normal function. The left ventricle has no regional wall motion abnormalities. The left ventricular internal cavity size was normal in size. There is no left ventricular hypertrophy. Left ventricular diastolic function could not be evaluated due to nondiagnostic images. Left  ventricular diastolic function could not be evaluated. Right Ventricle: The right ventricular size is normal. No increase in right ventricular wall thickness. Right ventricular systolic function is normal. Tricuspid regurgitation signal is inadequate for assessing PA pressure. Left Atrium: Left atrial size was normal in size. Right Atrium: Right atrial size was normal in size. Pericardium: There is no evidence of pericardial effusion. Mitral Valve: The mitral valve is grossly normal. No evidence of mitral valve regurgitation. No evidence of mitral valve stenosis. Tricuspid Valve: The tricuspid valve is grossly normal. Tricuspid valve regurgitation is trivial. No evidence of tricuspid stenosis. Aortic Valve: The NCC contains focal calcification. There is no regurgitation to suggest this is  vegetation. The aortic valve is tricuspid. There is mild calcification of the aortic valve. Aortic valve regurgitation is not visualized. Mild aortic valve sclerosis is present, with no evidence of aortic valve stenosis. Pulmonic Valve: The pulmonic valve was grossly normal. Pulmonic valve regurgitation is not visualized. No evidence of pulmonic stenosis. Aorta: The aortic root and ascending aorta are structurally normal, with no evidence of dilitation. Venous: The inferior vena cava is normal in size with greater than 50% respiratory variability, suggesting right atrial pressure of 3 mmHg. IAS/Shunts: The atrial septum is grossly normal. Additional Comments: There is a small pleural effusion in the left lateral region.  LEFT VENTRICLE PLAX 2D LVIDd:         4.50 cm LVIDs:         3.20 cm LV PW:         1.30 cm LV IVS:        0.90 cm LVOT diam:     2.00 cm LV SV:  57 LV SV Index:   30 LVOT Area:     3.14 cm  LV Volumes (MOD) LV vol d, MOD A2C: 42.9 ml LV vol d, MOD A4C: 62.1 ml LV vol s, MOD A2C: 27.5 ml LV vol s, MOD A4C: 24.8 ml LV SV MOD A2C:     15.4 ml LV SV MOD A4C:     62.1 ml LV SV MOD BP:      25.9 ml RIGHT VENTRICLE            IVC RV S prime:     9.32 cm/s  IVC diam: 1.80 cm TAPSE (M-mode): 1.4 cm LEFT ATRIUM             Index       RIGHT ATRIUM           Index LA diam:        2.50 cm 1.31 cm/m  RA Area:     11.40 cm LA Vol (A2C):   16.2 ml 8.47 ml/m  RA Volume:   25.00 ml  13.07 ml/m LA Vol (A4C):   20.0 ml 10.46 ml/m LA Biplane Vol: 18.8 ml 9.83 ml/m  AORTIC VALVE LVOT Vmax:   99.20 cm/s LVOT Vmean:  70.000 cm/s LVOT VTI:    0.183 m  AORTA Ao Root diam: 3.30 cm Ao Asc diam:  3.00 cm MITRAL VALVE MV Area (PHT): 4.15 cm    SHUNTS MV Decel Time: 183 msec    Systemic VTI:  0.18 m MV E velocity: 67.50 cm/s  Systemic Diam: 2.00 cm MV A velocity: 88.70 cm/s MV E/A ratio:  0.76 Lennie Odor MD Electronically signed by Lennie Odor MD Signature Date/Time: 10/23/2020/11:56:59 AM    Final      Labs:  CBC: Recent Labs    10/23/20 0454 10/24/20 0132 10/25/20 0055 10/26/20 0212  WBC 8.0 7.1 8.5 8.9  HGB 10.1* 10.7* 11.4* 10.8*  HCT 29.1* 30.6* 33.8* 31.1*  PLT 165 166 167 155    COAGS: Recent Labs    10/30/2020 0100  INR 1.4*  APTT 42*    BMP: Recent Labs    08/12/20 0540 08/13/20 0548 08/13/20 0548 08/21/20 0000 09/25/20 0000 10/22/2020 0100 10/24/20 0132 10/25/20 0543 10/25/20 1323 10/26/20 0212  NA 146* 142   < > 138 142   < > 148* 153* 151* 150*  K 3.8 3.9  --  4.7 4.3   < > 3.8 4.1 4.7 4.8  CL 108 110  --  102 100   < > 116* 121* 120* 118*  CO2 26 22  --  25* 23*   < > 22 23 21* 23  GLUCOSE 221* 161*  --   --   --    < > 372* 231* 282* 272*  BUN 25* 24*   < > 10 9   < > 64* 46* 38* 33*  CALCIUM 7.9* 7.6*  --  8.5* 9.7   < > 8.1* 8.3* 8.4* 8.2*  CREATININE 1.34* 1.04   < > 0.8 1.1   < > 1.80* 1.23 1.18 1.07  GFRNONAA 50* >60  --  83.93 62.96   < > 38* >60 >60 >60  GFRAA 58* >60  --  >90 72.97  --   --   --   --   --    < > = values in this interval not displayed.    LIVER FUNCTION TESTS: Recent Labs    11/08/2020 0100 10/22/20 0512 10/25/20 0543 10/26/20  0212  BILITOT 1.6* 0.5 0.8 0.5  AST 123* 70* 153* 132*  ALT 58* 39 45* 47*  ALKPHOS 203* 138* 188* 189*  PROT 8.1 6.4* 6.8 6.8  ALBUMIN 1.5* 1.2* 1.5* 1.3*    Assessment and Plan:  History of sepsis presumed secondary to left thigh/hip abscess s/p left thigh/hip drain in IR 2020/10/30. Left thigh/hip drain stable with approximately 15-20 cc bloody fluid in suction bulb (additional 40 cc output from drain in past 24 hours per chart). Continue current drain management- continue with Qshift flushes/monitor of output. Plan for repeat CT when output <10 cc/day (assess for possible removal). Further plans per [facility]- appreciate and agree with management. IR to follow.   Electronically Signed: Elwin Mocha, PA-C 10/26/2020, 10:18 AM   I spent a total of 25 Minutes at the the patient's  bedside AND on the patient's hospital floor or unit, greater than 50% of which was counseling/coordinating care for left thigh/hip abscess s/p left thigh/hip drain placement.

## 2020-10-26 NOTE — Progress Notes (Addendum)
Subjective: Somnolent Antibiotics:  Anti-infectives (From admission, onward)   Start     Dose/Rate Route Frequency Ordered Stop   10/26/20 1100  rifampin (RIFADIN) 300 mg in sodium chloride 0.9 % 100 mL IVPB        300 mg 200 mL/hr over 30 Minutes Intravenous Every 12 hours 10/26/20 1007     10/24/20 0815  ceFAZolin (ANCEF) IVPB 2g/100 mL premix        2 g 200 mL/hr over 30 Minutes Intravenous Every 8 hours 10/24/20 0804     10/23/20 2200  ceFAZolin (ANCEF) IVPB 2g/100 mL premix  Status:  Discontinued        2 g 200 mL/hr over 30 Minutes Intravenous Every 12 hours 10/23/20 1406 10/24/20 0804   10/22/20 1000  ceFAZolin (ANCEF) IVPB 1 g/50 mL premix  Status:  Discontinued        1 g 100 mL/hr over 30 Minutes Intravenous Every 12 hours 11/19/20 1745 10/23/20 1406   Nov 19, 2020 2200  ceFAZolin (ANCEF) IVPB 2g/100 mL premix        2 g 200 mL/hr over 30 Minutes Intravenous  Once 11/19/2020 1739 11/19/20 2239   11-19-20 2100  cefTRIAXone (ROCEPHIN) 2 g in sodium chloride 0.9 % 100 mL IVPB  Status:  Discontinued        2 g 200 mL/hr over 30 Minutes Intravenous Every 24 hours 19-Nov-2020 1117 11/19/20 1129   19-Nov-2020 1730  linezolid (ZYVOX) IVPB 600 mg  Status:  Discontinued        600 mg 300 mL/hr over 60 Minutes Intravenous Every 12 hours Nov 19, 2020 1154 2020-11-19 1739   11-19-20 1130  cefTRIAXone (ROCEPHIN) 2 g in sodium chloride 0.9 % 100 mL IVPB  Status:  Discontinued        2 g 200 mL/hr over 30 Minutes Intravenous Every 24 hours 19-Nov-2020 1129 11-19-20 1739   Nov 19, 2020 1000  meropenem (MERREM) 500 mg in sodium chloride 0.9 % 100 mL IVPB  Status:  Discontinued        500 mg 200 mL/hr over 30 Minutes Intravenous Every 12 hours 11/19/2020 0330 2020-11-19 1117   11/19/20 0400  linezolid (ZYVOX) IVPB 600 mg        600 mg 300 mL/hr over 60 Minutes Intravenous  Once 2020-11-19 0309 2020/11/19 0654   11/19/2020 0215  ceFEPIme (MAXIPIME) 2 g in sodium chloride 0.9 % 100 mL IVPB        2 g 200 mL/hr  over 30 Minutes Intravenous  Once 11/19/20 0212 November 19, 2020 0306   Nov 19, 2020 0215  ampicillin-sulbactam (UNASYN) 1.5 g in sodium chloride 0.9 % 100 mL IVPB  Status:  Discontinued        1.5 g 200 mL/hr over 30 Minutes Intravenous  Once November 19, 2020 0212 November 19, 2020 0214   2020/11/19 0215  vancomycin (VANCOCIN) IVPB 1000 mg/200 mL premix  Status:  Discontinued        1,000 mg 200 mL/hr over 60 Minutes Intravenous  Once 11-19-20 0212 19-Nov-2020 0314      Medications: Scheduled Meds: . brimonidine  1 drop Both Eyes TID  . Chlorhexidine Gluconate Cloth  6 each Topical Daily  . feeding supplement (PROSource TF)  45 mL Per Tube Daily  . free water  400 mL Per Tube Q4H  . heparin injection (subcutaneous)  5,000 Units Subcutaneous Q8H  . insulin aspart  0-15 Units Subcutaneous Q4H  . insulin aspart  8 Units Subcutaneous Q4H  . insulin glargine  30 Units Subcutaneous BID  . latanoprost  1 drop Both Eyes QHS  . metoprolol tartrate  50 mg Per Tube Q8H  . pantoprazole (PROTONIX) IV  40 mg Intravenous QHS  . sodium chloride flush  5 mL Intracatheter Q8H   Continuous Infusions: . sodium chloride Stopped (10/25/20 0603)  .  ceFAZolin (ANCEF) IV 2 g (10/26/20 0803)  . feeding supplement (OSMOLITE 1.2 CAL) 1,000 mL (10/25/20 1852)  . rifampin (RIFADIN) IVPB     PRN Meds:.sodium chloride, acetaminophen, docusate, HYDROmorphone (DILAUDID) injection, LORazepam, metoprolol tartrate, oxyCODONE, [START ON 10/30/2020] pneumococcal 23 valent vaccine, polyethylene glycol    Objective: Weight change:   Intake/Output Summary (Last 24 hours) at 10/26/2020 1114 Last data filed at 10/26/2020 0500 Gross per 24 hour  Intake 1090 ml  Output 2040 ml  Net -950 ml   Blood pressure 139/65, pulse (!) 131, temperature 98.4 F (36.9 C), temperature source Oral, resp. rate 20, height 6' (1.829 m), weight 69.5 kg, SpO2 100 %. Temp:  [98.3 F (36.8 C)-99.7 F (37.6 C)] 98.4 F (36.9 C) (12/09 0259) Pulse Rate:  [113-134] 131  (12/09 0259) Resp:  [19-31] 20 (12/09 0300) BP: (121-155)/(53-88) 139/65 (12/09 0259) SpO2:  [97 %-100 %] 100 % (12/09 0300) Weight:  [69.5 kg] 69.5 kg (12/09 0815)  Physical Exam: Physical Exam Constitutional:      Appearance: He is well-developed and well-nourished.  HENT:     Head: Normocephalic and atraumatic.  Eyes:     General:        Right eye: No discharge.        Left eye: No discharge.     Extraocular Movements: EOM normal.  Cardiovascular:     Rate and Rhythm: Normal rate and regular rhythm.  Pulmonary:     Effort: Pulmonary effort is normal. No respiratory distress.     Breath sounds: No wheezing.  Abdominal:     General: There is no distension.     Palpations: Abdomen is soft.  Musculoskeletal:        General: No edema. Normal range of motion.     Cervical back: Normal range of motion and neck supple.  Skin:    General: Skin is warm and dry.     Findings: No erythema or rash.  Neurological:     General: No focal deficit present.     Mental Status: He is disoriented.     Motor: Weakness present.  Psychiatric:        Mood and Affect: Mood and affect normal.        Speech: He is noncommunicative.    Drain with material in place CBC:    BMET Recent Labs    10/25/20 1323 10/26/20 0212  NA 151* 150*  K 4.7 4.8  CL 120* 118*  CO2 21* 23  GLUCOSE 282* 272*  BUN 38* 33*  CREATININE 1.18 1.07  CALCIUM 8.4* 8.2*     Liver Panel  Recent Labs    10/25/20 0543 10/26/20 0212  PROT 6.8 6.8  ALBUMIN 1.5* 1.3*  AST 153* 132*  ALT 45* 47*  ALKPHOS 188* 189*  BILITOT 0.8 0.5       Sedimentation Rate No results for input(s): ESRSEDRATE in the last 72 hours. C-Reactive Protein No results for input(s): CRP in the last 72 hours.  Micro Results: Recent Results (from the past 720 hour(s))  Culture, blood (Routine x 2)     Status: Abnormal   Collection Time: 2020-10-09  1:00 AM  Specimen: BLOOD  Result Value Ref Range Status   Specimen  Description BLOOD SITE NOT SPECIFIED  Final   Special Requests   Final    BOTTLES DRAWN AEROBIC AND ANAEROBIC Blood Culture adequate volume   Culture  Setup Time   Final    GRAM POSITIVE COCCI IN CLUSTERS IN BOTH AEROBIC AND ANAEROBIC BOTTLES CRITICAL VALUE NOTED.  VALUE IS CONSISTENT WITH PREVIOUSLY REPORTED AND CALLED VALUE.    Culture (A)  Final    STAPHYLOCOCCUS AUREUS SUSCEPTIBILITIES PERFORMED ON PREVIOUS CULTURE WITHIN THE LAST 5 DAYS. Performed at [facility], [address] [place], [place] [address]    Report Status 10/23/2020 FINAL  Final  Culture, blood (Routine x 2)     Status: Abnormal   Collection Time: 11/03/2020  1:00 AM   Specimen: BLOOD  Result Value Ref Range Status   Specimen Description BLOOD SITE NOT SPECIFIED  Final   Special Requests   Final    BOTTLES DRAWN AEROBIC AND ANAEROBIC Blood Culture results may not be optimal due to an inadequate volume of blood received in culture bottles   Culture  Setup Time   Final    GRAM POSITIVE COCCI IN CLUSTERS IN BOTH AEROBIC AND ANAEROBIC BOTTLES CRITICAL RESULT CALLED TO, READ BACK BY AND VERIFIED WITH: E MARTEN,PHARMD AT 1717 10/24/2020 BY L BENFIELD Performed at [facility], [address], [place], [place] [address]    Culture STAPHYLOCOCCUS AUREUS (A)  Final   Report Status 10/23/2020 FINAL  Final   Organism ID, Bacteria STAPHYLOCOCCUS AUREUS  Final      Susceptibility   Staphylococcus aureus - MIC*    CIPROFLOXACIN >=8 RESISTANT Resistant     ERYTHROMYCIN <=0.25 SENSITIVE Sensitive     GENTAMICIN <=0.5 SENSITIVE Sensitive     OXACILLIN 0.5 SENSITIVE Sensitive     TETRACYCLINE <=1 SENSITIVE Sensitive     VANCOMYCIN <=0.5 SENSITIVE Sensitive     TRIMETH/SULFA <=10 SENSITIVE Sensitive     CLINDAMYCIN <=0.25 SENSITIVE Sensitive     RIFAMPIN <=0.5 SENSITIVE Sensitive     Inducible Clindamycin NEGATIVE Sensitive     * STAPHYLOCOCCUS AUREUS  Blood Culture ID Panel (Reflexed)     Status: Abnormal    Collection Time: 11/15/2020  1:00 AM  Result Value Ref Range Status   Enterococcus faecalis NOT DETECTED NOT DETECTED Final   Enterococcus Faecium NOT DETECTED NOT DETECTED Final   Listeria monocytogenes NOT DETECTED NOT DETECTED Final   Staphylococcus species DETECTED (A) NOT DETECTED Final    Comment: CRITICAL RESULT CALLED TO, READ BACK BY AND VERIFIED WITH: E MARTEN,PHARMD AT 1717 10/20/2020 BY L BENFIELD    Staphylococcus aureus (BCID) DETECTED (A) NOT DETECTED Final    Comment: CRITICAL RESULT CALLED TO, READ BACK BY AND VERIFIED WITH: E MARTEN,PHARMD AT 1717 10/29/2020 BY L BENFIELD    Staphylococcus epidermidis NOT DETECTED NOT DETECTED Final   Staphylococcus lugdunensis NOT DETECTED NOT DETECTED Final   Streptococcus species NOT DETECTED NOT DETECTED Final   Streptococcus agalactiae NOT DETECTED NOT DETECTED Final   Streptococcus pneumoniae NOT DETECTED NOT DETECTED Final   Streptococcus pyogenes NOT DETECTED NOT DETECTED Final   A.calcoaceticus-baumannii NOT DETECTED NOT DETECTED Final   Bacteroides fragilis NOT DETECTED NOT DETECTED Final   Enterobacterales NOT DETECTED NOT DETECTED Final   Enterobacter cloacae complex NOT DETECTED NOT DETECTED Final   Escherichia coli NOT DETECTED NOT DETECTED Final   Klebsiella aerogenes NOT DETECTED NOT DETECTED Final   Klebsiella oxytoca NOT  DETECTED NOT DETECTED Final   Klebsiella pneumoniae NOT DETECTED NOT DETECTED Final   Proteus species NOT DETECTED NOT DETECTED Final   Salmonella species NOT DETECTED NOT DETECTED Final   Serratia marcescens NOT DETECTED NOT DETECTED Final   Haemophilus influenzae NOT DETECTED NOT DETECTED Final   Neisseria meningitidis NOT DETECTED NOT DETECTED Final   Pseudomonas aeruginosa NOT DETECTED NOT DETECTED Final   Stenotrophomonas maltophilia NOT DETECTED NOT DETECTED Final   Candida albicans NOT DETECTED NOT DETECTED Final   Candida auris NOT DETECTED NOT DETECTED Final   Candida glabrata NOT DETECTED NOT  DETECTED Final   Candida krusei NOT DETECTED NOT DETECTED Final   Candida parapsilosis NOT DETECTED NOT DETECTED Final   Candida tropicalis NOT DETECTED NOT DETECTED Final   Cryptococcus neoformans/gattii NOT DETECTED NOT DETECTED Final   Meth resistant mecA/C and MREJ NOT DETECTED NOT DETECTED Final    Comment: Performed at [facility], [address] [place], [place] [address]  Resp Panel by RT-PCR (Flu A&B, Covid) Nasopharyngeal Swab     Status: None   Collection Time: 10/29/2020  2:46 AM   Specimen: Nasopharyngeal Swab; Nasopharyngeal(NP) swabs in vial transport medium  Result Value Ref Range Status   SARS Coronavirus 2 by RT PCR NEGATIVE NEGATIVE Final    Comment: (NOTE) SARS-CoV-2 target nucleic acids are NOT DETECTED.  The SARS-CoV-2 RNA is generally detectable in upper respiratory specimens during the acute phase of infection. The lowest concentration of SARS-CoV-2 viral copies this assay can detect is 138 copies/mL. A negative result does not preclude SARS-Cov-2 infection and should not be used as the sole basis for treatment or other patient management decisions. A negative result may occur with  improper specimen collection/handling, submission of specimen other than nasopharyngeal swab, presence of viral mutation(s) within the areas targeted by this assay, and inadequate number of viral copies(<138 copies/mL). A negative result must be combined with clinical observations, patient history, and epidemiological information. The expected result is Negative.  Fact Sheet for Patients:  BloggerCourse.com  Fact Sheet for Healthcare Providers:  SeriousBroker.it  This test is no t yet approved or cleared by the [place] FDA and  has been authorized for detection and/or diagnosis of SARS-CoV-2 by FDA under an Emergency Use Authorization (EUA). This EUA will remain  in effect (meaning this test can be used) for the  duration of the COVID-19 declaration under Section 564(b)(1) of the Act, 21 U.S.C.section 360bbb-3(b)(1), unless the authorization is terminated  or revoked sooner.       Influenza A by PCR NEGATIVE NEGATIVE Final   Influenza B by PCR NEGATIVE NEGATIVE Final    Comment: (NOTE) The Xpert Xpress SARS-CoV-2/FLU/RSV plus assay is intended as an aid in the diagnosis of influenza from Nasopharyngeal swab specimens and should not be used as a sole basis for treatment. Nasal washings and aspirates are unacceptable for Xpert Xpress SARS-CoV-2/FLU/RSV testing.  Fact Sheet for Patients: BloggerCourse.com  Fact Sheet for Healthcare Providers: SeriousBroker.it  This test is not yet approved or cleared by the [place] FDA and has been authorized for detection and/or diagnosis of SARS-CoV-2 by FDA under an Emergency Use Authorization (EUA). This EUA will remain in effect (meaning this test can be used) for the duration of the COVID-19 declaration under Section 564(b)(1) of the Act, 21 U.S.C. section 360bbb-3(b)(1), unless the authorization is terminated or revoked.  Performed at [facility], [address], [place], [place] [address]   Urine culture  Status: Abnormal   Collection Time: 11/10/2020  2:48 AM   Specimen: In/Out Cath Urine  Result Value Ref Range Status   Specimen Description IN/OUT CATH URINE  Final   Special Requests   Final    NONE Performed at [facility], [address], [place], [place] [address]    Culture >=100,000 COLONIES/mL STAPHYLOCOCCUS AUREUS (A)  Final   Report Status 10/23/2020 FINAL  Final   Organism ID, Bacteria STAPHYLOCOCCUS AUREUS (A)  Final      Susceptibility   Staphylococcus aureus - MIC*    CIPROFLOXACIN >=8 RESISTANT Resistant     GENTAMICIN <=0.5 SENSITIVE Sensitive     NITROFURANTOIN 32 SENSITIVE Sensitive     OXACILLIN 0.5 SENSITIVE Sensitive     TETRACYCLINE <=1 SENSITIVE  Sensitive     VANCOMYCIN <=0.5 SENSITIVE Sensitive     TRIMETH/SULFA <=10 SENSITIVE Sensitive     CLINDAMYCIN <=0.25 SENSITIVE Sensitive     RIFAMPIN <=0.5 SENSITIVE Sensitive     Inducible Clindamycin NEGATIVE Sensitive     * >=100,000 COLONIES/mL STAPHYLOCOCCUS AUREUS  Aerobic/Anaerobic Culture (surgical/deep wound)     Status: None (Preliminary result)   Collection Time: 10/31/2020  1:47 PM   Specimen: Abscess  Result Value Ref Range Status   Specimen Description ABSCESS  Final   Special Requests LEFT THIGH/HIP ABSC  Final   Gram Stain   Final    FEW WBC PRESENT, PREDOMINANTLY PMN MODERATE GRAM POSITIVE COCCI IN PAIRS IN CLUSTERS Performed at [facility], [address], [place], [place] [address]    Culture   Final    ABUNDANT STAPHYLOCOCCUS AUREUS NO ANAEROBES ISOLATED; CULTURE IN PROGRESS FOR 5 DAYS    Report Status PENDING  Incomplete   Organism ID, Bacteria STAPHYLOCOCCUS AUREUS  Final      Susceptibility   Staphylococcus aureus - MIC*    CIPROFLOXACIN >=8 RESISTANT Resistant     ERYTHROMYCIN <=0.25 SENSITIVE Sensitive     GENTAMICIN <=0.5 SENSITIVE Sensitive     OXACILLIN 0.5 SENSITIVE Sensitive     TETRACYCLINE <=1 SENSITIVE Sensitive     VANCOMYCIN <=0.5 SENSITIVE Sensitive     TRIMETH/SULFA <=10 SENSITIVE Sensitive     CLINDAMYCIN <=0.25 SENSITIVE Sensitive     RIFAMPIN <=0.5 SENSITIVE Sensitive     Inducible Clindamycin NEGATIVE Sensitive     * ABUNDANT STAPHYLOCOCCUS AUREUS  MRSA PCR Screening     Status: None   Collection Time: 11/03/2020  3:12 PM   Specimen: Nasopharyngeal  Result Value Ref Range Status   MRSA by PCR NEGATIVE NEGATIVE Final    Comment:        The GeneXpert MRSA Assay (FDA approved for NASAL specimens only), is one component of a comprehensive MRSA colonization surveillance program. It is not intended to diagnose MRSA infection nor to guide or monitor treatment for MRSA infections. Performed at [facility], [address] [place], [place] [address]   Culture, blood (routine x 2)     Status: None (Preliminary result)   Collection Time: 10/22/20 11:40 AM   Specimen: BLOOD LEFT ARM  Result Value Ref Range Status   Specimen Description BLOOD LEFT ARM  Final   Special Requests   Final    BOTTLES DRAWN AEROBIC AND ANAEROBIC Blood Culture adequate volume   Culture   Final    NO GROWTH 4 DAYS Performed at [facility], [address] [place], [place] [address]    Report Status PENDING  Incomplete  Culture, blood (routine x 2)     Status: None (Preliminary result)   Collection Time: 10/22/20 11:55 AM   Specimen: BLOOD LEFT HAND  Result Value Ref Range Status   Specimen Description BLOOD LEFT HAND  Final   Special Requests   Final    BOTTLES DRAWN AEROBIC AND ANAEROBIC Blood Culture adequate volume   Culture   Final    NO GROWTH 4 DAYS Performed at [facility], [address], [place], [place] [address]    Report Status PENDING  Incomplete    Studies/Results: MR BRAIN WO CONTRAST  Result Date: 10/24/2020 CLINICAL DATA:  Initial evaluation for brain abscess, history of MSSA bacteremia. EXAM: MRI HEAD WITHOUT CONTRAST TECHNIQUE: Multiplanar, multiecho pulse sequences of the brain and surrounding structures were obtained without intravenous contrast. COMPARISON:  Prior CT from 08/06/2020. FINDINGS: Brain: Diffuse prominence of the CSF containing spaces compatible generalized age-related cerebral atrophy. Patchy and confluent T2/FLAIR hyperintensity within the periventricular and deep white matter both cerebral hemispheres most consistent with chronic small vessel ischemic disease, moderate in nature. Extensive encephalomalacia and gliosis involving the right cerebral hemisphere consistent with a chronic right MCA distribution infarct. Associated wallerian degeneration extends into the right cerebral peduncle and midbrain/pons. No abnormal foci of restricted diffusion to suggest acute or subacute  ischemia. No diffusion abnormality to suggest frank intraparenchymal abscess. Gray-white matter differentiation otherwise maintained. No evidence for acute or chronic intracranial hemorrhage. No mass lesion, midline shift or mass effect. Ex vacuo dilatation of the right lateral ventricle related to the chronic right MCA distribution infarct. No hydrocephalus. No extra-axial fluid collection. Pituitary gland suprasellar region normal. Midline structures intact. Vascular: Attenuated flow void within the right MCA territory related to the chronic right MCA territory infarct. Major intracranial vascular flow voids are otherwise maintained. Skull and upper cervical spine: Craniocervical junction within normal limits. Visualized upper cervical spine normal. Bone marrow signal intensity normal. No scalp soft tissue abnormality. Sinuses/Orbits: Patient status post bilateral ocular lens replacement. Globes and orbital soft tissues demonstrate no acute finding. Mild chronic mucosal thickening noted within the ethmoidal air cells and maxillary sinuses. No air-fluid levels to suggest acute sinusitis. Mastoid air cells are largely clear. Inner ear structures grossly normal. Other: None. IMPRESSION: 1. No acute intracranial abnormality. Specifically, no evidence for intracranial abscess or other infection. 2. Chronic right MCA distribution infarct. 3. 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 Electronically Signed   By: Rise Mu M.D.   On: 10/24/2020 19:27      Assessment/Plan:  INTERVAL HISTORY:     Principal Problem:   MSSA bacteremia Active Problems:   Protein-calorie malnutrition, severe (HCC)   Encounter for nasogastric (NG) tube placement   Severe sepsis (HCC)   Abscess of left hip   Hypernatremia   Abscess   Acute kidney injury (nontraumatic) (HCC)   Dehydration   Elevated liver function tests   Prosthetic hip infection  (HCC)    Marcus Downs. is a 79 y.o. 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  Orthopedics do not feel that he is a good operative candidate at this point in time and that he might in fact require a [place] for control of the infection operatively  Echocardiogram does not show obvious endocarditis. Repeat blood cultures been taken and negative to date  MRI of the brain did not show any evidence of embolizations  We will add rifampin today he will need  SIX weeks of IV cefazolin and po rifampin followed by protracted indefinite oral Keflex and rifampin.  I will make a follow-up appointment for him in my clinic  Kemarion Abbey. has an appointment on 11/22/2020 at 1115 with Dr. Daiva Eves  The [facility] for Infectious Disease is located in the [facility] at  [address] [place] in [place].  [address], which is located to the left of the elevators.  Phone: [phone]  Fax: [phone]  [email or web address]   He should arrive 15 minutes prior to his appt   I  will sign off for now please call with further  Questions    LOS: 5 days   Paulette Blanch Dam 10/26/2020, 11:14 AM

## 2020-10-26 NOTE — Progress Notes (Signed)
PHARMACY CONSULT NOTE FOR:  OUTPATIENT  PARENTERAL ANTIBIOTIC THERAPY (OPAT)  Indication: MSSA bacteremia/prosthetic hip infection  Regimen: Cefazolin 2 gm IV Q 8 hours + Rifampin 300 mg PO BID  End date: 12/03/20  IV antibiotic discharge orders are pended. To discharging provider:  please sign these orders via discharge navigator,  Select New Orders & click on the button choice - Manage This Unsigned Work.     Thank you for allowing pharmacy to be a part of this patient's care.  Sharin Mons, PharmD, BCPS, BCIDP Infectious Diseases Clinical Pharmacist Phone: [phone] 10/26/2020, 11:35 AM

## 2020-10-26 NOTE — Progress Notes (Signed)
[facility] PROGRESS NOTE   Marcus Downs. XTG:[id number] DOB: 1941/10/09 DOA: 11/08/2020  PCP: [name], MD  Brief History/Interval Summary: 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 Luiz Blare 07/2020. He was sent to the ED from his nursing home for worsening altered mental status, agitation, and findings of AKI with electrolyte abnormalities.  CT A/P revealed prominent fluid collection of the left hip, lateral to arthroplasty hardware. [place] was performed of the area, revealed complex fluid collection.  Reason for Visit: Sepsis secondary to MSSA  Consultants: Orthopedics.  Interventional radiology.  Critical care medicine.  Procedures: 12/4: Left hip fluid collection drainage by IR  Antibiotics: Anti-infectives (From admission, onward)   Start     Dose/Rate Route Frequency Ordered Stop   10/26/20 1100  rifampin (RIFADIN) 300 mg in sodium chloride 0.9 % 100 mL IVPB        300 mg 200 mL/hr over 30 Minutes Intravenous Every 12 hours 10/26/20 1007     10/24/20 0815  ceFAZolin (ANCEF) IVPB 2g/100 mL premix        2 g 200 mL/hr over 30 Minutes Intravenous Every 8 hours 10/24/20 0804     10/23/20 2200  ceFAZolin (ANCEF) IVPB 2g/100 mL premix  Status:  Discontinued        2 g 200 mL/hr over 30 Minutes Intravenous Every 12 hours 10/23/20 1406 10/24/20 0804   10/22/20 1000  ceFAZolin (ANCEF) IVPB 1 g/50 mL premix  Status:  Discontinued        1 g 100 mL/hr over 30 Minutes Intravenous Every 12 hours 11/15/2020 1745 10/23/20 1406   10/18/2020 2200  ceFAZolin (ANCEF) IVPB 2g/100 mL premix        2 g 200 mL/hr over 30 Minutes Intravenous  Once 10/27/2020 1739 10/29/2020 2239   11/04/2020 2100  cefTRIAXone (ROCEPHIN) 2 g in sodium chloride 0.9 % 100  mL IVPB  Status:  Discontinued        2 g 200 mL/hr over 30 Minutes Intravenous Every 24 hours 11/12/2020 1117 10/25/2020 1129   11/17/2020 1730  linezolid (ZYVOX) IVPB 600 mg  Status:  Discontinued        600 mg 300 mL/hr over 60 Minutes Intravenous Every 12 hours 11/11/2020 1154 10/19/2020 1739   10/29/2020 1130  cefTRIAXone (ROCEPHIN) 2 g in sodium chloride 0.9 % 100 mL IVPB  Status:  Discontinued        2 g 200 mL/hr over 30 Minutes Intravenous Every 24 hours 10/29/2020 1129 11/07/2020 1739   10/27/2020 1000  meropenem (MERREM) 500 mg in sodium chloride 0.9 % 100 mL IVPB  Status:  Discontinued        500 mg 200 mL/hr over 30 Minutes Intravenous Every 12 hours 11/16/2020 0330 10/23/2020 1117   11/04/2020 0400  linezolid (ZYVOX) IVPB 600 mg        600 mg 300 mL/hr over 60 Minutes Intravenous  Once 10/29/2020 0309 10/18/2020 0654   10/22/2020 0215  ceFEPIme (MAXIPIME) 2 g in sodium chloride 0.9 % 100 mL IVPB        2 g 200 mL/hr over 30 Minutes Intravenous  Once 10/26/2020 0212 11/08/2020 0306   10/27/2020 0215  ampicillin-sulbactam (UNASYN) 1.5 g in sodium chloride 0.9 % 100 mL IVPB  Status:  Discontinued        1.5 g 200 mL/hr over 30 Minutes Intravenous  Once 10/29/2020 0212 11/14/2020 0214   11/05/2020 0215  vancomycin (VANCOCIN) IVPB 1000 mg/200 mL premix  Status:  Discontinued        1,000 mg 200 mL/hr over 60 Minutes Intravenous  Once 10/31/2020 8299 11/16/2020 0314      Subjective/Interval History: Patient seems to be little bit more responsive today compared to yesterday though still not very communicative. No overnight events noted.    Assessment/Plan:  MSSA bacteremia/sepsis secondary to MSSA/left hip abscess/septic shock Patient was in the intensive care unit due to septic shock.  He required pressors.  Was eventually weaned off of pressors.  Cultures are growing MSSA.   Patient currently on cefazolin.  Echocardiogram does not show any vegetation. Blood cultures repeated on 12/5 and without any growth so  far. Patient not a good candidate for operative intervention of the left hip.  Interventional radiology was consulted and abscess has been drained. Continue to watch drain output. Plan is to repeat CT scan when output has decreased. Further management per ID.  Orthopedics continues to follow.  SVT/sinus tachycardia Heart rate remains elevated.  Multiple EKGs have been done all of which suggest sinus tachycardia.  Discussed also with cardiology who reviewed the EKG.  They also feel that this is sinus tachycardia with some ectopy.   Dose of metoprolol was increased yesterday. Heart rate seems to be better today. TSH normal at 1.6. Continue to treat underlying cause of sinus tachycardia which includes infection agitation.   Acute metabolic encephalopathy in the setting of history of stroke and vascular dementia Patient's baseline mentation not entirely clear.  Discussed with her son who mentions that over the past 3 months patient has had a decline in his mentation.   Recent brain MRI did not show any acute process. Acute illness likely contributing to his encephalopathy along with the baseline dementia. Continue to monitor.   Acute kidney injury/hypernatremia Likely secondary to sepsis and prerenal acidemia.  Renal function has improved.   Sodium was noted to be elevated yesterday. Increase free water down his NG tube. Noted to be trending downwards. Recheck tomorrow. Potassium is normal.  Microcytic anemia History of iron deficiency is noted.  Hemoglobin more or less at baseline.  No evidence for overt bleeding.  Transaminitis Thought to be due to septic shock.  Although AST was not noted to be significantly elevated.  LFTs remain elevated but stable.  Continue to monitor.  Diabetes mellitus type 2, uncontrolled with hyperglycemia HbA1c 8.1 when last checked. CBGs remain poorly controlled despite titration of Lantus dose. Will increase dose further today. Continue SSI.    History of  dysphagia Currently on tube feedings via NG tube.  Will eventually need to have speech therapy evaluate him but currently is too lethargic and encephalopathic to cooperate..  Goals of care Long discussion with patient's son regarding patient's trajectory over the last several months.  He was told about his severity of illness.  Also told about the fact that patient has a history of vascular dementia which will not get any better.  Despite all of this patient's son wants to continue full scope of care for now. Patient's prognosis seems to be guarded at this time. If there is no significant improvement over the next 2 to 3 days may need to involve palliative care to have another discussion with patient's son and perhaps the rest of the family.  Pressure injury  Pressure Injury 08/07/20 Buttocks Right;Medial;Left Stage 2 -  Partial thickness loss of dermis presenting as a shallow open injury with a red, pink wound bed without slough. (Active)  08/07/20 1914  Location: Buttocks  Location Orientation: Right;Medial;Left  Staging: Stage 2 -  Partial thickness loss of dermis presenting as a shallow open injury with a red, pink wound bed without slough.  Wound Description (Comments):   Present on Admission: Yes    Severe malnutrition Nutrition Problem: Severe Malnutrition Etiology: chronic illness (dementia)  Signs/Symptoms: severe fat depletion,severe muscle depletion,percent weight loss (15.4% weight loss in 9 months) Percent weight loss: 15.4 % (less than 9 months)  Interventions: Tube feeding,Prostat,MVI   DVT Prophylaxis: Subcutaneous heparin Code Status: Full code.  This was discussed with patient's son in detail. Family Communication: Son being updated periodically. Yesterday Disposition Plan: Return to SNF when improved  Status is: Inpatient  Remains inpatient appropriate because:Altered mental status, IV treatments appropriate due to intensity of illness or inability to take PO and  Inpatient level of care appropriate due to severity of illness   Dispo:  Patient From: Skilled Nursing Facility  Planned Disposition: Skilled Nursing Facility  Expected discharge date: 10/28/2020  Medically stable for discharge: No      Medications:  Scheduled: . brimonidine  1 drop Both Eyes TID  . Chlorhexidine Gluconate Cloth  6 each Topical Daily  . feeding supplement (PROSource TF)  45 mL Per Tube Daily  . free water  400 mL Per Tube Q4H  . heparin injection (subcutaneous)  5,000 Units Subcutaneous Q8H  . insulin aspart  0-15 Units Subcutaneous Q4H  . insulin aspart  5 Units Subcutaneous Q4H  . insulin glargine  25 Units Subcutaneous BID  . latanoprost  1 drop Both Eyes QHS  . metoprolol tartrate  50 mg Per Tube Q8H  . pantoprazole (PROTONIX) IV  40 mg Intravenous QHS  . sodium chloride flush  5 mL Intracatheter Q8H   Continuous: . sodium chloride Stopped (10/25/20 0603)  .  ceFAZolin (ANCEF) IV 2 g (10/26/20 0803)  . feeding supplement (OSMOLITE 1.2 CAL) 1,000 mL (10/25/20 1852)  . rifampin (RIFADIN) IVPB     ZOX:WRUEAV chloride, acetaminophen, docusate, HYDROmorphone (DILAUDID) injection, LORazepam, metoprolol tartrate, oxyCODONE, [START ON 10/30/2020] pneumococcal 23 valent vaccine, polyethylene glycol   Objective:  Vital Signs  Vitals:   10/26/20 0034 10/26/20 0259 10/26/20 0300 10/26/20 0815  BP: 133/75 139/65    Pulse: (!) 120 (!) 131    Resp: Temp:  98.4 F (36.9 C)    TempSrc:  Oral    SpO2: 100% 100% 100%   Weight:    69.5 kg  Height:        Intake/Output Summary (Last 24 hours) at 10/26/2020 1047 Last data filed at 10/26/2020 0500 Gross per 24 hour  Intake 1155 ml  Output 2040 ml  Net -885 ml   Filed Weights   10/24/20 0500 10/25/20 0332 10/26/20 0815  Weight: 70 kg 69.3 kg 69.5 kg    General appearance: Lethargic. Resp: Mildly tachypneic. Diminished air entry at the base of the heart no wheezing or rhonchi. Few  crackles. Cardio: S1-S2 is tachycardic regular.  No S3-S4.  No rubs murmurs or bruit. Telemetry shows sinus tachycardia. Rate seems to be better this morning compared to yesterday. GI: Abdomen is soft.  Nontender nondistended.  Bowel sounds are present normal.  No masses organomegaly Extremities: No edema.   Neurologic: No focal neurological deficits.  Lab Results:  Data Reviewed: I have personally reviewed following labs and imaging studies  CBC: Recent Labs  Lab 10/22/20 0512 10/23/20 0454 10/24/20 0132 10/25/20 0055 10/26/20 0212  WBC 9.9 8.0 7.1 8.5 8.9  NEUTROABS 8.1* 5.8 5.5 6.6 6.5  HGB 10.5* 10.1* 10.7* 11.4* 10.8*  HCT 29.9* 29.1* 30.6* 33.8* 31.1*  MCV 74.9* 75.4* 74.8* 76.3* 76.0*  PLT 210 165 166 167 155    Basic Metabolic Panel: Recent Labs  Lab 10/22/20 0512 10/22/20 0933 10/22/20 1841 10/23/20 0454 10/23/20 1807 10/24/20 0132 10/25/20 0543 10/25/20 1323 10/26/20 0212  NA 146*  --   --  146*  --  148* 153* 151* 150*  K 4.9  --   --  3.8  --  3.8 4.1 4.7 4.8  CL 115*  --   --  117*  --  116* 121* 120* 118*  CO2 20*  --   --  17*  --  22 23 21* 23  GLUCOSE 202*  --   --  342*  --  372* 231* 282* 272*  BUN 75*  --   --  75*  --  64* 46* 38* 33*  CREATININE 3.16*  --   --  2.07*  --  1.80* 1.23 1.18 1.07  CALCIUM 8.0*  --   --  8.0*  --  8.1* 8.3* 8.4* 8.2*  MG 2.4 2.4 2.6* 2.6* 2.5*  --   --   --   --   PHOS 4.5 4.5 4.1 3.5 3.0  --   --   --   --     GFR: Estimated Creatinine Clearance: 55.9 mL/min (by C-G formula based on SCr of 1.07 mg/dL).  Liver Function Tests: Recent Labs  Lab 2020-11-18 0100 10/22/20 0512 10/25/20 0543 10/26/20 0212  AST 123* 70* 153* 132*  ALT 58* 39 45* 47*  ALKPHOS 203* 138* 188* 189*  BILITOT 1.6* 0.5 0.8 0.5  PROT 8.1 6.4* 6.8 6.8  ALBUMIN 1.5* 1.2* 1.5* 1.3*     Recent Labs  Lab 11-18-2020 0722  AMMONIA <9*    Coagulation Profile: Recent Labs  Lab 11-18-20 0100  INR 1.4*    CBG: Recent Labs   Lab 10/25/20 1517 10/25/20 1920 10/25/20 2345 10/26/20 0407 10/26/20 0723  GLUCAP 228* 204* 243* 303* 245*      Recent Results (from the past 240 hour(s))  Culture, blood (Routine x 2)     Status: Abnormal   Collection Time: 11/18/2020  1:00 AM   Specimen: BLOOD  Result Value Ref Range Status   Specimen Description BLOOD SITE NOT SPECIFIED  Final   Special Requests   Final    BOTTLES DRAWN AEROBIC AND ANAEROBIC Blood Culture adequate volume   Culture  Setup Time   Final    GRAM POSITIVE COCCI IN CLUSTERS IN BOTH AEROBIC AND ANAEROBIC BOTTLES CRITICAL VALUE NOTED.  VALUE IS CONSISTENT WITH PREVIOUSLY REPORTED AND CALLED VALUE.    Culture (A)  Final    STAPHYLOCOCCUS AUREUS SUSCEPTIBILITIES PERFORMED ON PREVIOUS CULTURE WITHIN THE LAST 5 DAYS. Performed at [facility], [address], [place], [place] [address]    Report Status 10/23/2020 FINAL  Final  Culture, blood (Routine x 2)     Status: Abnormal   Collection Time: 11/18/2020  1:00 AM   Specimen: BLOOD  Result Value Ref Range Status   Specimen Description BLOOD SITE NOT SPECIFIED  Final   Special Requests   Final    BOTTLES DRAWN  AEROBIC AND ANAEROBIC Blood Culture results may not be optimal due to an inadequate volume of blood received in culture bottles   Culture  Setup Time   Final    GRAM POSITIVE COCCI IN CLUSTERS IN BOTH AEROBIC AND ANAEROBIC BOTTLES CRITICAL RESULT CALLED TO, READ BACK BY AND VERIFIED WITH: E MARTEN,PHARMD AT 1717 11/07/2020 BY L BENFIELD Performed at [facility], [address] [place], [place] [address]    Culture STAPHYLOCOCCUS AUREUS (A)  Final   Report Status 10/23/2020 FINAL  Final   Organism ID, Bacteria STAPHYLOCOCCUS AUREUS  Final      Susceptibility   Staphylococcus aureus - MIC*    CIPROFLOXACIN >=8 RESISTANT Resistant     ERYTHROMYCIN <=0.25 SENSITIVE Sensitive     GENTAMICIN <=0.5 SENSITIVE Sensitive     OXACILLIN 0.5 SENSITIVE Sensitive     TETRACYCLINE <=1  SENSITIVE Sensitive     VANCOMYCIN <=0.5 SENSITIVE Sensitive     TRIMETH/SULFA <=10 SENSITIVE Sensitive     CLINDAMYCIN <=0.25 SENSITIVE Sensitive     RIFAMPIN <=0.5 SENSITIVE Sensitive     Inducible Clindamycin NEGATIVE Sensitive     * STAPHYLOCOCCUS AUREUS  Blood Culture ID Panel (Reflexed)     Status: Abnormal   Collection Time: 10/23/2020  1:00 AM  Result Value Ref Range Status   Enterococcus faecalis NOT DETECTED NOT DETECTED Final   Enterococcus Faecium NOT DETECTED NOT DETECTED Final   Listeria monocytogenes NOT DETECTED NOT DETECTED Final   Staphylococcus species DETECTED (A) NOT DETECTED Final    Comment: CRITICAL RESULT CALLED TO, READ BACK BY AND VERIFIED WITH: E MARTEN,PHARMD AT 1717 10/18/2020 BY L BENFIELD    Staphylococcus aureus (BCID) DETECTED (A) NOT DETECTED Final    Comment: CRITICAL RESULT CALLED TO, READ BACK BY AND VERIFIED WITH: E MARTEN,PHARMD AT 1717 10/25/2020 BY L BENFIELD    Staphylococcus epidermidis NOT DETECTED NOT DETECTED Final   Staphylococcus lugdunensis NOT DETECTED NOT DETECTED Final   Streptococcus species NOT DETECTED NOT DETECTED Final   Streptococcus agalactiae NOT DETECTED NOT DETECTED Final   Streptococcus pneumoniae NOT DETECTED NOT DETECTED Final   Streptococcus pyogenes NOT DETECTED NOT DETECTED Final   A.calcoaceticus-baumannii NOT DETECTED NOT DETECTED Final   Bacteroides fragilis NOT DETECTED NOT DETECTED Final   Enterobacterales NOT DETECTED NOT DETECTED Final   Enterobacter cloacae complex NOT DETECTED NOT DETECTED Final   Escherichia coli NOT DETECTED NOT DETECTED Final   Klebsiella aerogenes NOT DETECTED NOT DETECTED Final   Klebsiella oxytoca NOT DETECTED NOT DETECTED Final   Klebsiella pneumoniae NOT DETECTED NOT DETECTED Final   Proteus species NOT DETECTED NOT DETECTED Final   Salmonella species NOT DETECTED NOT DETECTED Final   Serratia marcescens NOT DETECTED NOT DETECTED Final   Haemophilus influenzae NOT DETECTED NOT DETECTED  Final   Neisseria meningitidis NOT DETECTED NOT DETECTED Final   Pseudomonas aeruginosa NOT DETECTED NOT DETECTED Final   Stenotrophomonas maltophilia NOT DETECTED NOT DETECTED Final   Candida albicans NOT DETECTED NOT DETECTED Final   Candida auris NOT DETECTED NOT DETECTED Final   Candida glabrata NOT DETECTED NOT DETECTED Final   Candida krusei NOT DETECTED NOT DETECTED Final   Candida parapsilosis NOT DETECTED NOT DETECTED Final   Candida tropicalis NOT DETECTED NOT DETECTED Final   Cryptococcus neoformans/gattii NOT DETECTED NOT DETECTED Final   Meth resistant mecA/C and MREJ NOT DETECTED NOT DETECTED Final    Comment: Performed at [facility], [address] [place], [place] [address]  Resp Panel by RT-PCR (Flu A&B, Covid) Nasopharyngeal Swab     Status: None   Collection Time: June 01, 2020  2:46 AM   Specimen: Nasopharyngeal Swab; Nasopharyngeal(NP) swabs in vial transport medium  Result Value Ref Range Status   SARS Coronavirus 2 by RT PCR NEGATIVE NEGATIVE Final    Comment: (NOTE) SARS-CoV-2 target nucleic acids are NOT DETECTED.  The SARS-CoV-2 RNA is generally detectable in upper respiratory specimens during the acute phase of infection. The lowest concentration of SARS-CoV-2 viral copies this assay can detect is 138 copies/mL. A negative result does not preclude SARS-Cov-2 infection and should not be used as the sole basis for treatment or other patient management decisions. A negative result may occur with  improper specimen collection/handling, submission of specimen other than nasopharyngeal swab, presence of viral mutation(s) within the areas targeted by this assay, and inadequate number of viral copies(<138 copies/mL). A negative result must be combined with clinical observations, patient history, and epidemiological information. The expected result is Negative.  Fact Sheet for Patients:  BloggerCourse.comhttps://www.fda.gov/media/152166/download  Fact Sheet for Healthcare  Providers:  SeriousBroker.ithttps://www.fda.gov/media/152162/download  This test is no t yet approved or cleared by the [place] FDA and  has been authorized for detection and/or diagnosis of SARS-CoV-2 by FDA under an Emergency Use Authorization (EUA). This EUA will remain  in effect (meaning this test can be used) for the duration of the COVID-19 declaration under Section 564(b)(1) of the Act, 21 U.S.C.section 360bbb-3(b)(1), unless the authorization is terminated  or revoked sooner.       Influenza A by PCR NEGATIVE NEGATIVE Final   Influenza B by PCR NEGATIVE NEGATIVE Final    Comment: (NOTE) The Xpert Xpress SARS-CoV-2/FLU/RSV plus assay is intended as an aid in the diagnosis of influenza from Nasopharyngeal swab specimens and should not be used as a sole basis for treatment. Nasal washings and aspirates are unacceptable for Xpert Xpress SARS-CoV-2/FLU/RSV testing.  Fact Sheet for Patients: BloggerCourse.comhttps://www.fda.gov/media/152166/download  Fact Sheet for Healthcare Providers: SeriousBroker.ithttps://www.fda.gov/media/152162/download  This test is not yet approved or cleared by the [place] FDA and has been authorized for detection and/or diagnosis of SARS-CoV-2 by FDA under an Emergency Use Authorization (EUA). This EUA will remain in effect (meaning this test can be used) for the duration of the COVID-19 declaration under Section 564(b)(1) of the Act, 21 U.S.C. section 360bbb-3(b)(1), unless the authorization is terminated or revoked.  Performed at [facility], [address] [place], [place] [address]   Urine culture     Status: Abnormal   Collection Time: June 01, 2020  2:48 AM   Specimen: In/Out Cath Urine  Result Value Ref Range Status   Specimen Description IN/OUT CATH URINE  Final   Special Requests   Final    NONE Performed at [facility], [address] [place], [place] [address]    Culture >=100,000 COLONIES/mL STAPHYLOCOCCUS AUREUS (A)  Final   Report Status 10/23/2020  FINAL  Final   Organism ID, Bacteria STAPHYLOCOCCUS AUREUS (A)  Final      Susceptibility   Staphylococcus aureus - MIC*    CIPROFLOXACIN >=8 RESISTANT Resistant     GENTAMICIN <=0.5 SENSITIVE Sensitive     NITROFURANTOIN 32 SENSITIVE Sensitive     OXACILLIN 0.5 SENSITIVE Sensitive     TETRACYCLINE <=1 SENSITIVE Sensitive     VANCOMYCIN <=0.5 SENSITIVE Sensitive     TRIMETH/SULFA <=10 SENSITIVE Sensitive     CLINDAMYCIN <=0.25 SENSITIVE Sensitive     RIFAMPIN <=0.5 SENSITIVE Sensitive  Inducible Clindamycin NEGATIVE Sensitive     * >=100,000 COLONIES/mL STAPHYLOCOCCUS AUREUS  Aerobic/Anaerobic Culture (surgical/deep wound)     Status: None (Preliminary result)   Collection Time: 11/03/2020  1:47 PM   Specimen: Abscess  Result Value Ref Range Status   Specimen Description ABSCESS  Final   Special Requests LEFT THIGH/HIP ABSC  Final   Gram Stain   Final    FEW WBC PRESENT, PREDOMINANTLY PMN MODERATE GRAM POSITIVE COCCI IN PAIRS IN CLUSTERS Performed at [facility], [address], [place], [place] [address]    Culture   Final    ABUNDANT STAPHYLOCOCCUS AUREUS NO ANAEROBES ISOLATED; CULTURE IN PROGRESS FOR 5 DAYS    Report Status PENDING  Incomplete   Organism ID, Bacteria STAPHYLOCOCCUS AUREUS  Final      Susceptibility   Staphylococcus aureus - MIC*    CIPROFLOXACIN >=8 RESISTANT Resistant     ERYTHROMYCIN <=0.25 SENSITIVE Sensitive     GENTAMICIN <=0.5 SENSITIVE Sensitive     OXACILLIN 0.5 SENSITIVE Sensitive     TETRACYCLINE <=1 SENSITIVE Sensitive     VANCOMYCIN <=0.5 SENSITIVE Sensitive     TRIMETH/SULFA <=10 SENSITIVE Sensitive     CLINDAMYCIN <=0.25 SENSITIVE Sensitive     RIFAMPIN <=0.5 SENSITIVE Sensitive     Inducible Clindamycin NEGATIVE Sensitive     * ABUNDANT STAPHYLOCOCCUS AUREUS  MRSA PCR Screening     Status: None   Collection Time: 11/11/2020  3:12 PM   Specimen: Nasopharyngeal  Result Value Ref Range Status   MRSA by PCR NEGATIVE NEGATIVE Final     Comment:        The GeneXpert MRSA Assay (FDA approved for NASAL specimens only), is one component of a comprehensive MRSA colonization surveillance program. It is not intended to diagnose MRSA infection nor to guide or monitor treatment for MRSA infections. Performed at [facility], [address] [place], [place] [address]   Culture, blood (routine x 2)     Status: None (Preliminary result)   Collection Time: 10/22/20 11:40 AM   Specimen: BLOOD LEFT ARM  Result Value Ref Range Status   Specimen Description BLOOD LEFT ARM  Final   Special Requests   Final    BOTTLES DRAWN AEROBIC AND ANAEROBIC Blood Culture adequate volume   Culture   Final    NO GROWTH 4 DAYS Performed at [facility], [address], [place], [place] [address]    Report Status PENDING  Incomplete  Culture, blood (routine x 2)     Status: None (Preliminary result)   Collection Time: 10/22/20 11:55 AM   Specimen: BLOOD LEFT HAND  Result Value Ref Range Status   Specimen Description BLOOD LEFT HAND  Final   Special Requests   Final    BOTTLES DRAWN AEROBIC AND ANAEROBIC Blood Culture adequate volume   Culture   Final    NO GROWTH 4 DAYS Performed at [facility], [address] [place], [place] [address]    Report Status PENDING  Incomplete      Radiology Studies: MR BRAIN WO CONTRAST  Result Date: 10/24/2020 CLINICAL DATA:  Initial evaluation for brain abscess, history of MSSA bacteremia. EXAM: MRI HEAD WITHOUT CONTRAST TECHNIQUE: Multiplanar, multiecho pulse sequences of the brain and surrounding structures were obtained without intravenous contrast. COMPARISON:  Prior CT from 08/06/2020. FINDINGS: Brain: Diffuse prominence of the CSF containing spaces compatible generalized age-related cerebral atrophy. Patchy and confluent T2/FLAIR hyperintensity within the periventricular and  deep white matter both cerebral hemispheres most consistent with chronic small vessel ischemic disease,  moderate in nature. Extensive encephalomalacia and gliosis involving the right cerebral hemisphere consistent with a chronic right MCA distribution infarct. Associated wallerian degeneration extends into the right cerebral peduncle and midbrain/pons. No abnormal foci of restricted diffusion to suggest acute or subacute ischemia. No diffusion abnormality to suggest frank intraparenchymal abscess. Gray-white matter differentiation otherwise maintained. No evidence for acute or chronic intracranial hemorrhage. No mass lesion, midline shift or mass effect. Ex vacuo dilatation of the right lateral ventricle related to the chronic right MCA distribution infarct. No hydrocephalus. No extra-axial fluid collection. Pituitary gland suprasellar region normal. Midline structures intact. Vascular: Attenuated flow void within the right MCA territory related to the chronic right MCA territory infarct. Major intracranial vascular flow voids are otherwise maintained. Skull and upper cervical spine: Craniocervical junction within normal limits. Visualized upper cervical spine normal. Bone marrow signal intensity normal. No scalp soft tissue abnormality. Sinuses/Orbits: Patient status post bilateral ocular lens replacement. Globes and orbital soft tissues demonstrate no acute finding. Mild chronic mucosal thickening noted within the ethmoidal air cells and maxillary sinuses. No air-fluid levels to suggest acute sinusitis. Mastoid air cells are largely clear. Inner ear structures grossly normal. Other: None. IMPRESSION: 1. No acute intracranial abnormality. Specifically, no evidence for intracranial abscess or other infection. 2. Chronic right MCA distribution infarct. 3. 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 Electronically Signed   By: Rise Mu M.D.   On: 10/24/2020 19:27       LOS: 5 days   Rayn Shorb Rito Ehrlich  [facility] Pager on  [email or web address]  10/26/2020, 10:47 AM

## 2020-10-26 NOTE — OR Nursing (Signed)
I added the correct PA to the case. The Physician Assistant name was incorrect.

## 2020-10-26 NOTE — Progress Notes (Signed)
Telemetry called and said pt. HR sustaining in 140's. PRN metoprolol previously given for HR sustaining in 130s. MD made aware. New order received and to bedside to assess.

## 2020-10-27 LAB — CULTURE, BLOOD (ROUTINE X 2)
Culture: NO GROWTH
Culture: NO GROWTH
Special Requests: ADEQUATE
Special Requests: ADEQUATE

## 2020-10-27 LAB — GLUCOSE, CAPILLARY
Glucose-Capillary: 171 mg/dL — ABNORMAL HIGH (ref 70–99)
Glucose-Capillary: 67 mg/dL — ABNORMAL LOW (ref 70–99)
Glucose-Capillary: 138 mg/dL — ABNORMAL HIGH (ref 70–99)
Glucose-Capillary: 147 mg/dL — ABNORMAL HIGH (ref 70–99)
Glucose-Capillary: 177 mg/dL — ABNORMAL HIGH (ref 70–99)
Glucose-Capillary: 244 mg/dL — ABNORMAL HIGH (ref 70–99)

## 2020-10-27 LAB — COMPREHENSIVE METABOLIC PANEL
ALT: 30 U/L (ref 0–44)
AST: 71 U/L — ABNORMAL HIGH (ref 15–41)
Albumin: 1.3 g/dL — ABNORMAL LOW (ref 3.5–5.0)
Alkaline Phosphatase: 176 U/L — ABNORMAL HIGH (ref 38–126)
Anion gap: 8 (ref 5–15)
BUN: 25 mg/dL — ABNORMAL HIGH (ref 8–23)
CO2: 23 mmol/L (ref 22–32)
Calcium: 7.7 mg/dL — ABNORMAL LOW (ref 8.9–10.3)
Chloride: 116 mmol/L — ABNORMAL HIGH (ref 98–111)
Creatinine, Ser: 0.97 mg/dL (ref 0.61–1.24)
GFR, Estimated: >60 mL/min (ref >60)
Glucose, Bld: 192 mg/dL — ABNORMAL HIGH (ref 70–99)
Potassium: 3.8 mmol/L (ref 3.5–5.1)
Sodium: 147 mmol/L — ABNORMAL HIGH (ref 135–145)
Total Bilirubin: 1.1 mg/dL (ref 0.3–1.2)
Total Protein: 6.5 g/dL (ref 6.5–8.1)

## 2020-10-27 MED ORDER — DILTIAZEM 12 MG/ML ORAL SUSPENSION
30.0000 mg | Freq: Three times a day (TID) | ORAL | Status: DC
  Administered 2020-10-27 – 2020-10-29 (×5): 30 mg
  Filled 2020-10-27 (×7): qty 3

## 2020-10-27 MED ORDER — METOPROLOL TARTRATE 25 MG/10 ML ORAL SUSPENSION
50.0000 mg | Freq: Three times a day (TID) | ORAL | Status: DC
  Administered 2020-10-27 – 2020-10-28 (×5): 50 mg
  Filled 2020-10-27 (×5): qty 20

## 2020-10-27 MED ORDER — INSULIN GLARGINE 100 UNIT/ML ~~LOC~~ SOLN
25.0000 [IU] | Freq: Two times a day (BID) | SUBCUTANEOUS | Status: DC
  Administered 2020-10-27 – 2020-10-29 (×5): 25 [IU] via SUBCUTANEOUS
  Filled 2020-10-27 (×7): qty 0.25

## 2020-10-27 MED ORDER — DEXTROSE 50 % IV SOLN
1.0000 | Freq: Once | INTRAVENOUS | Status: AC
  Administered 2020-10-27: 50 mL via INTRAVENOUS
  Filled 2020-10-27: qty 50

## 2020-10-27 MED ORDER — INSULIN ASPART 100 UNIT/ML ~~LOC~~ SOLN
4.0000 [IU] | SUBCUTANEOUS | Status: DC
  Administered 2020-10-27 – 2020-10-29 (×14): 4 [IU] via SUBCUTANEOUS

## 2020-10-27 NOTE — Plan of Care (Signed)
  Problem: Education: Goal: Knowledge of General Education information will improve Description Including pain rating scale, medication(s)/side effects and non-pharmacologic comfort measures Outcome: Progressing   

## 2020-10-27 NOTE — Plan of Care (Signed)
  Problem: Education: Goal: Knowledge of General Education information will improve Description: Including pain rating scale, medication(s)/side effects and non-pharmacologic comfort measures Outcome: Not Progressing   Problem: Health Behavior/Discharge Planning: Goal: Ability to manage health-related needs will improve Outcome: Not Progressing   Problem: Clinical Measurements: Goal: Ability to maintain clinical measurements within normal limits will improve Outcome: Not Progressing Goal: Will remain free from infection Outcome: Not Progressing Goal: Diagnostic test results will improve Outcome: Not Progressing Goal: Respiratory complications will improve Outcome: Not Progressing Goal: Cardiovascular complication will be avoided Outcome: Not Progressing   Problem: Activity: Goal: Risk for activity intolerance will decrease Outcome: Not Progressing   Problem: Nutrition: Goal: Adequate nutrition will be maintained Outcome: Not Progressing   Problem: Coping: Goal: Level of anxiety will decrease Outcome: Not Progressing   Problem: Elimination: Goal: Will not experience complications related to bowel motility Outcome: Not Progressing Goal: Will not experience complications related to urinary retention Outcome: Not Progressing   Problem: Pain Managment: Goal: General experience of comfort will improve Outcome: Not Progressing   Problem: Safety: Goal: Ability to remain free from injury will improve Outcome: Not Progressing   Problem: Skin Integrity: Goal: Risk for impaired skin integrity will decrease Outcome: Not Progressing   Problem: Nutrition Goal: Patient maintains adequate hydration Outcome: Not Progressing Goal: Patient maintains weight Outcome: Not Progressing Goal: Patient/Family demonstrates understanding of diet Outcome: Not Progressing Goal: Patient/Family independently completes tube feeding Outcome: Not Progressing Goal: Patient will have no more  than 5 lb weight change during LOS Outcome: Not Progressing Goal: Patient will utilize adaptive techniques to administer nutrition Outcome: Not Progressing Goal: Patient will verbalize dietary restrictions Outcome: Not Progressing   Problem: Urinary Elimination: Goal: Signs and symptoms of infection will decrease Outcome: Not Progressing   Problem: Safety: Goal: Non-violent Restraint(s) Outcome: Not Progressing

## 2020-10-27 NOTE — Progress Notes (Signed)
  Speech Language Pathology Treatment: Dysphagia  Patient Details Name: Marcus Downs. MRN: [id number] DOB: 1941-04-17 Today's Date: 10/27/2020 Time: 6283-1517 SLP Time Calculation (min) (ACUTE ONLY): 11 min  Assessment / Plan / Recommendation Clinical Impression  Pt was seen for dysphagia treatment. He was lethargic and vocalized during the session but did not open his eyes, respond to the SLP's questions, or verbalize. Oral care was completed by the SLP and pt exhibited some limited lingual movement. However, bolus awareness continues to be impaired and he did not demonstrate any bolus manipulation of ice chips, liquids or purees. Pt still does not present as a candidate for safe oral intake at this time; it is recommended that his NPO status be maintained and that nonoral alimentation be continued. Pt has been followed by SLP since 12/4, but his participation has been limited initially due to agitation, but subsequently and most consistently due to lethargy. SLP will sign off at this time. However, please re-consult as clinically indicated when/if pt's level of alertness improves.    HPI HPI: Patient is a 79 y.o. male with PMH: CVA with residual dysphagia and hemiplegia, GERD, DM-2, HTN, HLD, weight loss, s/p hip hemiarthroplasty. He presented to hospital from SNF, s/p hip fracture and surgery 3 months ago and is now septic and being treated for UTI and hip abscess.      SLP Plan  Discharge SLP treatment due to (comment) (Pt unable to participate in treatment due to lethargy; please re-consult when/if clinically indicated)       Recommendations  Diet recommendations: NPO Medication Administration: Via alternative means                Oral Care Recommendations: Oral care QID Follow up Recommendations: Skilled Nursing facility SLP Visit Diagnosis: Dysphagia, unspecified (R13.10) Plan: Discharge SLP treatment due to (comment) (Pt unable to participate in treatment due to  lethargy)       Jaanvi Fizer I. Vear Clock, MS, CCC-SLP Acute Rehabilitation Services Office number [phone] Pager [phone]               Scheryl Marten 10/27/2020, 5:25 PM

## 2020-10-27 NOTE — Progress Notes (Signed)
[facility] PROGRESS NOTE   Marcus Downs. 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 Luiz Blare 07/2020. He was sent to the ED from his nursing home for worsening altered mental status, agitation, and findings of AKI with electrolyte abnormalities.  CT A/P revealed prominent fluid collection of the left hip, lateral to arthroplasty hardware. [place] was performed of the area, revealed complex fluid collection.  Reason for Visit: Sepsis secondary to MSSA  Consultants: Orthopedics.  Interventional radiology.  Critical care medicine.  Procedures: 12/4: Left hip fluid collection drainage by IR  Antibiotics: Anti-infectives (From admission, onward)   Start     Dose/Rate Route Frequency Ordered Stop   10/26/20 1100  rifampin (RIFADIN) 300 mg in sodium chloride 0.9 % 100 mL IVPB        300 mg 200 mL/hr over 30 Minutes Intravenous Every 12 hours 10/26/20 1007     10/24/20 0815  ceFAZolin (ANCEF) IVPB 2g/100 mL premix        2 g 200 mL/hr over 30 Minutes Intravenous Every 8 hours 10/24/20 0804     10/23/20 2200  ceFAZolin (ANCEF) IVPB 2g/100 mL premix  Status:  Discontinued        2 g 200 mL/hr over 30 Minutes Intravenous Every 12 hours 10/23/20 1406 10/24/20 0804   10/22/20 1000  ceFAZolin (ANCEF) IVPB 1 g/50 mL premix  Status:  Discontinued        1 g 100 mL/hr over 30 Minutes Intravenous Every 12 hours 11/15/2020 1745 10/23/20 1406   10/18/2020 2200  ceFAZolin (ANCEF) IVPB 2g/100 mL premix        2 g 200 mL/hr over 30 Minutes Intravenous  Once 10/27/2020 1739 10/29/2020 2239   11/04/2020 2100  cefTRIAXone (ROCEPHIN) 2 g in sodium chloride 0.9 % 100  mL IVPB  Status:  Discontinued        2 g 200 mL/hr over 30 Minutes Intravenous Every 24 hours 11/12/2020 1117 10/25/2020 1129   11/17/2020 1730  linezolid (ZYVOX) IVPB 600 mg  Status:  Discontinued        600 mg 300 mL/hr over 60 Minutes Intravenous Every 12 hours 11/11/2020 1154 10/19/2020 1739   10/29/2020 1130  cefTRIAXone (ROCEPHIN) 2 g in sodium chloride 0.9 % 100 mL IVPB  Status:  Discontinued        2 g 200 mL/hr over 30 Minutes Intravenous Every 24 hours 10/29/2020 1129 11/07/2020 1739   10/27/2020 1000  meropenem (MERREM) 500 mg in sodium chloride 0.9 % 100 mL IVPB  Status:  Discontinued        500 mg 200 mL/hr over 30 Minutes Intravenous Every 12 hours 11/16/2020 0330 10/23/2020 1117   11/04/2020 0400  linezolid (ZYVOX) IVPB 600 mg        600 mg 300 mL/hr over 60 Minutes Intravenous  Once 10/29/2020 0309 10/18/2020 0654   10/22/2020 0215  ceFEPIme (MAXIPIME) 2 g in sodium chloride 0.9 % 100 mL IVPB        2 g 200 mL/hr over 30 Minutes Intravenous  Once 10/26/2020 0212 11/08/2020 0306   10/27/2020 0215  ampicillin-sulbactam (UNASYN) 1.5 g in sodium chloride 0.9 % 100 mL IVPB  Status:  Discontinued        1.5 g 200 mL/hr over 30 Minutes Intravenous  Once 11-05-2020 0212 11-05-2020 0214   2020/11/05 0215  vancomycin (VANCOCIN) IVPB 1000 mg/200 mL premix  Status:  Discontinued        1,000 mg 200 mL/hr over 60 Minutes Intravenous  Once 11/05/2020 1610 Nov 05, 2020 0314      Subjective/Interval History: Patient remains lethargic.  Not very communicative.  Does not even open his eyes.  Noted to have hypoglycemia this morning.  See below.   Assessment/Plan:  MSSA bacteremia/sepsis secondary to MSSA/left hip abscess/septic shock Patient was in the intensive care unit due to septic shock.  He required pressors.  Was eventually weaned off of pressors.  Cultures are growing MSSA.   Patient currently on cefazolin.  Rifampin was added by ID on 12/9.  Plan is for 6 weeks of IV cefazolin and oral rifampin followed by protracted  indefinite course of oral Keflex and oral rifampin. Echocardiogram does not show any vegetation. Blood cultures repeated on 12/5 and without any growth so far. Patient not a good candidate for operative intervention of the left hip.  Interventional radiology was consulted and abscess has been drained. Continue to watch drain output. Plan is to repeat CT scan when output has decreased. Orthopedics and interventional radiology continues to follow.  SVT/sinus tachycardia Heart rate remains elevated.  This is despite escalating dose of metoprolol.  EKGs were reviewed with cardiology to make sure there is no other arrhythmia.  They also feel that this is all sinus tachycardia.  Likely being driven by his acute illness.  May consider switching him from metoprolol to Cardizem to see if that provides better heart rate control.  TSH was normal at 1.6.  Echocardiogram was also done as discussed above.    Acute metabolic encephalopathy in the setting of history of stroke and vascular dementia Patient's baseline mentation not entirely clear.  Discussed with her son who mentions that over the past 3 months patient has had a decline in his mentation.   Recent brain MRI did not show any acute process. Acute illness likely contributing to his encephalopathy along with the baseline dementia.  Patient has not shown any improvement in the last several days.  If there is no appreciable change in the next 48 hours then will consult palliative care as we may need to discuss again with the patient's son regarding his prognosis which appears to be quite guarded at this time.  Acute kidney injury/hypernatremia Likely secondary to sepsis and prerenal acidemia.  Renal function has improved.   Increase in free water down his NG tube.  Potassium is normal.    Microcytic anemia History of iron deficiency is noted.  Hemoglobin more or less at baseline.  No evidence for overt bleeding.  Transaminitis Thought to be due to septic  shock.  Although AST was not noted to be significantly elevated.  LFTs remain elevated but stable.  Continue to monitor.  Diabetes mellitus type 2, uncontrolled with hyperglycemia HbA1c 8.1 when last checked.  CBGs are finally getting better.  Doses of his insulin regimen will be adjusted.  This morning he had an hypoglycemic episode.  Will cut back on Lantus dose and his scheduled NovoLog dosage.     History of dysphagia Currently on tube feedings via NG tube.  Will eventually need to have speech therapy evaluate him but currently is too lethargic and encephalopathic to cooperate..  Goals of care Long discussion with patient's son regarding  patient's trajectory over the last several months.  He was told about his severity of illness.  Also told about the fact that patient has a history of vascular dementia which will not get any better.  Despite all of this patient's son wants to continue full scope of care for now. Patient's prognosis seems to be guarded at this time. If there is no significant improvement over the next 2 days may need to involve palliative care to have another discussion with patient's son and perhaps the rest of the family.  Pressure injury Pressure Injury 08/07/20 Buttocks Right;Medial;Left Stage 2 -  Partial thickness loss of dermis presenting as a shallow open injury with a red, pink wound bed without slough. (Active)  08/07/20 1914  Location: Buttocks  Location Orientation: Right;Medial;Left  Staging: Stage 2 -  Partial thickness loss of dermis presenting as a shallow open injury with a red, pink wound bed without slough.  Wound Description (Comments):   Present on Admission: Yes    Severe malnutrition Nutrition Problem: Severe Malnutrition Etiology: chronic illness (dementia)  Signs/Symptoms: severe fat depletion,severe muscle depletion,percent weight loss (15.4% weight loss in 9 months) Percent weight loss: 15.4 % (less than 9 months)  Interventions: Tube  feeding,Prostat,MVI   DVT Prophylaxis: Subcutaneous heparin Code Status: Full code.  This was discussed with patient's son in detail. Family Communication: Son being updated periodically. Disposition Plan: Return to SNF when improved  Status is: Inpatient  Remains inpatient appropriate because:Altered mental status, IV treatments appropriate due to intensity of illness or inability to take PO and Inpatient level of care appropriate due to severity of illness   Dispo:  Patient From: Skilled Nursing Facility  Planned Disposition: Skilled Nursing Facility  Expected discharge date: 10/30/2020  Medically stable for discharge: No      Medications:  Scheduled: . brimonidine  1 drop Both Eyes TID  . Chlorhexidine Gluconate Cloth  6 each Topical Daily  . feeding supplement (PROSource TF)  45 mL Per Tube Daily  . free water  400 mL Per Tube Q4H  . heparin injection (subcutaneous)  5,000 Units Subcutaneous Q8H  . insulin aspart  0-15 Units Subcutaneous Q4H  . insulin aspart  4 Units Subcutaneous Q4H  . insulin glargine  25 Units Subcutaneous BID  . latanoprost  1 drop Both Eyes QHS  . metoprolol tartrate  75 mg Per Tube Q8H  . pantoprazole (PROTONIX) IV  40 mg Intravenous QHS  . sodium chloride flush  5 mL Intracatheter Q8H   Continuous: . sodium chloride Stopped (10/25/20 0603)  .  ceFAZolin (ANCEF) IV 2 g (10/27/20 0144)  . feeding supplement (OSMOLITE 1.2 CAL) 1,000 mL (10/27/20 0738)  . rifampin (RIFADIN) IVPB 300 mg (10/26/20 2302)   SWN:IOEVOJ chloride, acetaminophen, docusate, HYDROmorphone (DILAUDID) injection, LORazepam, metoprolol tartrate, oxyCODONE, [START ON 10/30/2020] pneumococcal 23 valent vaccine, polyethylene glycol   Objective:  Vital Signs  Vitals:   10/27/20 0239 10/27/20 0300 10/27/20 0400 10/27/20 0532  BP: 128/73   (!) 142/71  Pulse: (!) 112   98  Resp: 20  20 20   Temp:    97.9 F (36.6 C)  TempSrc:    Oral  SpO2: 99% 100% 98% 99%  Weight:       Height:        Intake/Output Summary (Last 24 hours) at 10/27/2020 0931 Last data filed at 10/27/2020 0600 Gross per 24 hour  Intake 415.13 ml  Output 2955 ml  Net -2539.87 ml   14/08/2020  10/24/20 0500 10/25/20 0332 10/26/20 0815  Weight: 70 kg 69.3 kg 69.5 kg    General appearance: Remains lethargic Resp: Mildly tachypneic.  Diminished air entry at the bases.  No wheezing or rhonchi.  Few crackles.   Cardio: S1-S2 is tachycardic regular.  No S3-S4.  No rubs murmurs or bruit telemetry shows sinus tachycardia rate varying from 110 to 130. GI: Abdomen is soft.  Nontender nondistended.  Bowel sounds are present normal.  No masses organomegaly Extremities: No edema.  Neurologic: No focal neurological deficits.       Lab Results:  Data Reviewed: I have personally reviewed following labs and imaging studies  CBC: Recent Labs  Lab 10/22/20 0512 10/23/20 0454 10/24/20 0132 10/25/20 0055 10/26/20 0212  WBC 9.9 8.0 7.1 8.5 8.9  NEUTROABS 8.1* 5.8 5.5 6.6 6.5  HGB 10.5* 10.1* 10.7* 11.4* 10.8*  HCT 29.9* 29.1* 30.6* 33.8* 31.1*  MCV 74.9* 75.4* 74.8* 76.3* 76.0*  PLT 210 165 166 167 155    Basic Metabolic Panel: Recent Labs  Lab 10/22/20 0512 10/22/20 0933 10/22/20 1841 10/23/20 0454 10/23/20 1807 10/24/20 0132 10/25/20 0543 10/25/20 1323 10/26/20 0212 10/27/20 0244  NA 146*  --   --  146*  --  148* 153* 151* 150* 147*  K 4.9  --   --  3.8  --  3.8 4.1 4.7 4.8 3.8  CL 115*  --   --  117*  --  116* 121* 120* 118* 116*  CO2 20*  --   --  17*  --  22 23 21* 23 23  GLUCOSE 202*  --   --  342*  --  372* 231* 282* 272* 192*  BUN 75*  --   --  75*  --  64* 46* 38* 33* 25*  CREATININE 3.16*  --   --  2.07*  --  1.80* 1.23 1.18 1.07 0.97  CALCIUM 8.0*  --   --  8.0*  --  8.1* 8.3* 8.4* 8.2* 7.7*  MG 2.4 2.4 2.6* 2.6* 2.5*  --   --   --   --   --   PHOS 4.5 4.5 4.1 3.5 3.0  --   --   --   --   --     GFR: Estimated Creatinine Clearance: 61.7 mL/min (by C-G  formula based on SCr of 0.97 mg/dL).  Liver Function Tests: Recent Labs  Lab 11/04/2020 0100 10/22/20 0512 10/25/20 0543 10/26/20 0212 10/27/20 0244  AST 123* 70* 153* 132* 71*  ALT 58* 39 45* 47* 30  ALKPHOS 203* 138* 188* 189* 176*  BILITOT 1.6* 0.5 0.8 0.5 1.1  PROT 8.1 6.4* 6.8 6.8 6.5  ALBUMIN 1.5* 1.2* 1.5* 1.3* 1.3*     Recent Labs  Lab 11/16/2020 0722  AMMONIA <9*    Coagulation Profile: Recent Labs  Lab 11/08/2020 0100  INR 1.4*    CBG: Recent Labs  Lab 10/26/20 1930 10/26/20 2313 10/27/20 0308 10/27/20 0752 10/27/20 0858  GLUCAP 103* 144* 171* 67* 138*      Recent Results (from the past 240 hour(s))  Culture, blood (Routine x 2)     Status: Abnormal   Collection Time: 11/03/2020  1:00 AM   Specimen: BLOOD  Result Value Ref Range Status   Specimen Description BLOOD SITE NOT SPECIFIED  Final   Special Requests   Final    BOTTLES DRAWN AEROBIC AND ANAEROBIC Blood Culture adequate volume   Culture  Setup Time   Final  GRAM POSITIVE COCCI IN CLUSTERS IN BOTH AEROBIC AND ANAEROBIC BOTTLES CRITICAL VALUE NOTED.  VALUE IS CONSISTENT WITH PREVIOUSLY REPORTED AND CALLED VALUE.    Culture (A)  Final    STAPHYLOCOCCUS AUREUS SUSCEPTIBILITIES PERFORMED ON PREVIOUS CULTURE WITHIN THE LAST 5 DAYS. Performed at [facility] Lab, [address], [place], [place] [address]    Report Status 10/23/2020 FINAL  Final  Culture, blood (Routine x 2)     Status: Abnormal   Collection Time: 11/08/2020  1:00 AM   Specimen: BLOOD  Result Value Ref Range Status   Specimen Description BLOOD SITE NOT SPECIFIED  Final   Special Requests   Final    BOTTLES DRAWN AEROBIC AND ANAEROBIC Blood Culture results may not be optimal due to an inadequate volume of blood received in culture bottles   Culture  Setup Time   Final    GRAM POSITIVE COCCI IN CLUSTERS IN BOTH AEROBIC AND ANAEROBIC BOTTLES CRITICAL RESULT CALLED TO, READ BACK BY AND VERIFIED WITH: E MARTEN,PHARMD AT 1717  11/04/2020 BY L BENFIELD Performed at [facility], [address] [place], [place] [address]    Culture STAPHYLOCOCCUS AUREUS (A)  Final   Report Status 10/23/2020 FINAL  Final   Organism ID, Bacteria STAPHYLOCOCCUS AUREUS  Final      Susceptibility   Staphylococcus aureus - MIC*    CIPROFLOXACIN >=8 RESISTANT Resistant     ERYTHROMYCIN <=0.25 SENSITIVE Sensitive     GENTAMICIN <=0.5 SENSITIVE Sensitive     OXACILLIN 0.5 SENSITIVE Sensitive     TETRACYCLINE <=1 SENSITIVE Sensitive     VANCOMYCIN <=0.5 SENSITIVE Sensitive     TRIMETH/SULFA <=10 SENSITIVE Sensitive     CLINDAMYCIN <=0.25 SENSITIVE Sensitive     RIFAMPIN <=0.5 SENSITIVE Sensitive     Inducible Clindamycin NEGATIVE Sensitive     * STAPHYLOCOCCUS AUREUS  Blood Culture ID Panel (Reflexed)     Status: Abnormal   Collection Time: 11/05/2020  1:00 AM  Result Value Ref Range Status   Enterococcus faecalis NOT DETECTED NOT DETECTED Final   Enterococcus Faecium NOT DETECTED NOT DETECTED Final   Listeria monocytogenes NOT DETECTED NOT DETECTED Final   Staphylococcus species DETECTED (A) NOT DETECTED Final    Comment: CRITICAL RESULT CALLED TO, READ BACK BY AND VERIFIED WITH: E MARTEN,PHARMD AT 1717 10/26/2020 BY L BENFIELD    Staphylococcus aureus (BCID) DETECTED (A) NOT DETECTED Final    Comment: CRITICAL RESULT CALLED TO, READ BACK BY AND VERIFIED WITH: E MARTEN,PHARMD AT 1717 10/23/2020 BY L BENFIELD    Staphylococcus epidermidis NOT DETECTED NOT DETECTED Final   Staphylococcus lugdunensis NOT DETECTED NOT DETECTED Final   Streptococcus species NOT DETECTED NOT DETECTED Final   Streptococcus agalactiae NOT DETECTED NOT DETECTED Final   Streptococcus pneumoniae NOT DETECTED NOT DETECTED Final   Streptococcus pyogenes NOT DETECTED NOT DETECTED Final   A.calcoaceticus-baumannii NOT DETECTED NOT DETECTED Final   Bacteroides fragilis NOT DETECTED NOT DETECTED Final   Enterobacterales NOT DETECTED NOT DETECTED Final    Enterobacter cloacae complex NOT DETECTED NOT DETECTED Final   Escherichia coli NOT DETECTED NOT DETECTED Final   Klebsiella aerogenes NOT DETECTED NOT DETECTED Final   Klebsiella oxytoca NOT DETECTED NOT DETECTED Final   Klebsiella pneumoniae NOT DETECTED NOT DETECTED Final   Proteus species NOT DETECTED NOT DETECTED Final   Salmonella species NOT DETECTED NOT DETECTED Final   Serratia marcescens NOT DETECTED NOT DETECTED Final   Haemophilus influenzae NOT DETECTED NOT DETECTED Final  Neisseria meningitidis NOT DETECTED NOT DETECTED Final   Pseudomonas aeruginosa NOT DETECTED NOT DETECTED Final   Stenotrophomonas maltophilia NOT DETECTED NOT DETECTED Final   Candida albicans NOT DETECTED NOT DETECTED Final   Candida auris NOT DETECTED NOT DETECTED Final   Candida glabrata NOT DETECTED NOT DETECTED Final   Candida krusei NOT DETECTED NOT DETECTED Final   Candida parapsilosis NOT DETECTED NOT DETECTED Final   Candida tropicalis NOT DETECTED NOT DETECTED Final   Cryptococcus neoformans/gattii NOT DETECTED NOT DETECTED Final   Meth resistant mecA/C and MREJ NOT DETECTED NOT DETECTED Final    Comment: Performed at [facility], [address] [place], [place] [address]  Resp Panel by RT-PCR (Flu A&B, Covid) Nasopharyngeal Swab     Status: None   Collection Time: 11/19/2020  2:46 AM   Specimen: Nasopharyngeal Swab; Nasopharyngeal(NP) swabs in vial transport medium  Result Value Ref Range Status   SARS Coronavirus 2 by RT PCR NEGATIVE NEGATIVE Final    Comment: (NOTE) SARS-CoV-2 target nucleic acids are NOT DETECTED.  The SARS-CoV-2 RNA is generally detectable in upper respiratory specimens during the acute phase of infection. The lowest concentration of SARS-CoV-2 viral copies this assay can detect is 138 copies/mL. A negative result does not preclude SARS-Cov-2 infection and should not be used as the sole basis for treatment or other patient management decisions. A negative result  may occur with  improper specimen collection/handling, submission of specimen other than nasopharyngeal swab, presence of viral mutation(s) within the areas targeted by this assay, and inadequate number of viral copies(<138 copies/mL). A negative result must be combined with clinical observations, patient history, and epidemiological information. The expected result is Negative.  Fact Sheet for Patients:  BloggerCourse.com  Fact Sheet for Healthcare Providers:  SeriousBroker.it  This test is no t yet approved or cleared by the [place] FDA and  has been authorized for detection and/or diagnosis of SARS-CoV-2 by FDA under an Emergency Use Authorization (EUA). This EUA will remain  in effect (meaning this test can be used) for the duration of the COVID-19 declaration under Section 564(b)(1) of the Act, 21 U.S.C.section 360bbb-3(b)(1), unless the authorization is terminated  or revoked sooner.       Influenza A by PCR NEGATIVE NEGATIVE Final   Influenza B by PCR NEGATIVE NEGATIVE Final    Comment: (NOTE) The Xpert Xpress SARS-CoV-2/FLU/RSV plus assay is intended as an aid in the diagnosis of influenza from Nasopharyngeal swab specimens and should not be used as a sole basis for treatment. Nasal washings and aspirates are unacceptable for Xpert Xpress SARS-CoV-2/FLU/RSV testing.  Fact Sheet for Patients: BloggerCourse.com  Fact Sheet for Healthcare Providers: SeriousBroker.it  This test is not yet approved or cleared by the [place] FDA and has been authorized for detection and/or diagnosis of SARS-CoV-2 by FDA under an Emergency Use Authorization (EUA). This EUA will remain in effect (meaning this test can be used) for the duration of the COVID-19 declaration under Section 564(b)(1) of the Act, 21 U.S.C. section 360bbb-3(b)(1), unless the authorization is terminated  or revoked.  Performed at [facility] Lab, [address], [place], [place] [address]   Urine culture     Status: Abnormal   Collection Time: 11-19-2020  2:48 AM   Specimen: In/Out Cath Urine  Result Value Ref Range Status   Specimen Description IN/OUT CATH URINE  Final   Special Requests   Final    NONE Performed at [facility], [address] [place], [place] [address]    Culture >=100,000 COLONIES/mL STAPHYLOCOCCUS AUREUS (A)  Final   Report Status 10/23/2020 FINAL  Final   Organism ID, Bacteria STAPHYLOCOCCUS AUREUS (A)  Final      Susceptibility   Staphylococcus aureus - MIC*    CIPROFLOXACIN >=8 RESISTANT Resistant     GENTAMICIN <=0.5 SENSITIVE Sensitive     NITROFURANTOIN 32 SENSITIVE Sensitive     OXACILLIN 0.5 SENSITIVE Sensitive     TETRACYCLINE <=1 SENSITIVE Sensitive     VANCOMYCIN <=0.5 SENSITIVE Sensitive     TRIMETH/SULFA <=10 SENSITIVE Sensitive     CLINDAMYCIN <=0.25 SENSITIVE Sensitive     RIFAMPIN <=0.5 SENSITIVE Sensitive     Inducible Clindamycin NEGATIVE Sensitive     * >=100,000 COLONIES/mL STAPHYLOCOCCUS AUREUS  Aerobic/Anaerobic Culture (surgical/deep wound)     Status: None   Collection Time: 2020-11-12  1:47 PM   Specimen: Abscess  Result Value Ref Range Status   Specimen Description ABSCESS  Final   Special Requests LEFT THIGH/HIP ABSC  Final   Gram Stain   Final    FEW WBC PRESENT, PREDOMINANTLY PMN MODERATE GRAM POSITIVE COCCI IN PAIRS IN CLUSTERS    Culture   Final    ABUNDANT STAPHYLOCOCCUS AUREUS NO ANAEROBES ISOLATED Performed at [facility], [address] [place], [place] [address]    Report Status 10/26/2020 FINAL  Final   Organism ID, Bacteria STAPHYLOCOCCUS AUREUS  Final      Susceptibility   Staphylococcus aureus - MIC*    CIPROFLOXACIN >=8 RESISTANT Resistant     ERYTHROMYCIN <=0.25 SENSITIVE Sensitive     GENTAMICIN <=0.5 SENSITIVE Sensitive     OXACILLIN 0.5 SENSITIVE Sensitive     TETRACYCLINE <=1  SENSITIVE Sensitive     VANCOMYCIN <=0.5 SENSITIVE Sensitive     TRIMETH/SULFA <=10 SENSITIVE Sensitive     CLINDAMYCIN <=0.25 SENSITIVE Sensitive     RIFAMPIN <=0.5 SENSITIVE Sensitive     Inducible Clindamycin NEGATIVE Sensitive     * ABUNDANT STAPHYLOCOCCUS AUREUS  MRSA PCR Screening     Status: None   Collection Time: 11/12/2020  3:12 PM   Specimen: Nasopharyngeal  Result Value Ref Range Status   MRSA by PCR NEGATIVE NEGATIVE Final    Comment:        The GeneXpert MRSA Assay (FDA approved for NASAL specimens only), is one component of a comprehensive MRSA colonization surveillance program. It is not intended to diagnose MRSA infection nor to guide or monitor treatment for MRSA infections. Performed at [facility], [address] [place], [place] [address]   Culture, blood (routine x 2)     Status: None   Collection Time: 10/22/20 11:40 AM   Specimen: BLOOD LEFT ARM  Result Value Ref Range Status   Specimen Description BLOOD LEFT ARM  Final   Special Requests   Final    BOTTLES DRAWN AEROBIC AND ANAEROBIC Blood Culture adequate volume   Culture   Final    NO GROWTH 5 DAYS Performed at [facility], [address], [place], [place] [address]    Report Status 10/27/2020 FINAL  Final  Culture, blood (routine x 2)     Status: None   Collection Time: 10/22/20 11:55 AM   Specimen: BLOOD LEFT HAND  Result Value Ref Range Status   Specimen Description BLOOD LEFT HAND  Final   Special Requests   Final    BOTTLES DRAWN AEROBIC AND ANAEROBIC Blood Culture adequate  volume   Culture   Final    NO GROWTH 5 DAYS Performed at [facility] Lab, [address], [place], [place] [address]    Report Status 10/27/2020 FINAL  Final      Radiology Studies: No results found.     LOS: 6 days   Harlan Ervine [employer] on [email or web address]  10/27/2020, 9:31 AM

## 2020-10-28 LAB — CBC
HCT: 25.7 % — ABNORMAL LOW (ref 39.0–52.0)
Hemoglobin: 8.8 g/dL — ABNORMAL LOW (ref 13.0–17.0)
MCH: 25.7 pg — ABNORMAL LOW (ref 26.0–34.0)
MCHC: 34.2 g/dL (ref 30.0–36.0)
MCV: 75.1 fL — ABNORMAL LOW (ref 80.0–100.0)
Platelets: 200 10*3/uL (ref 150–400)
RBC: 3.42 MIL/uL — ABNORMAL LOW (ref 4.22–5.81)
RDW: 18.3 % — ABNORMAL HIGH (ref 11.5–15.5)
WBC: 9 10*3/uL (ref 4.0–10.5)
nRBC: 0 % (ref 0.0–0.2)

## 2020-10-28 LAB — BASIC METABOLIC PANEL
Anion gap: 9 (ref 5–15)
BUN: 22 mg/dL (ref 8–23)
CO2: 20 mmol/L — ABNORMAL LOW (ref 22–32)
Calcium: 7.3 mg/dL — ABNORMAL LOW (ref 8.9–10.3)
Chloride: 112 mmol/L — ABNORMAL HIGH (ref 98–111)
Creatinine, Ser: 0.95 mg/dL (ref 0.61–1.24)
GFR, Estimated: >60 mL/min (ref >60)
Glucose, Bld: 276 mg/dL — ABNORMAL HIGH (ref 70–99)
Potassium: 4 mmol/L (ref 3.5–5.1)
Sodium: 141 mmol/L (ref 135–145)

## 2020-10-28 LAB — GLUCOSE, CAPILLARY
Glucose-Capillary: 252 mg/dL — ABNORMAL HIGH (ref 70–99)
Glucose-Capillary: 220 mg/dL — ABNORMAL HIGH (ref 70–99)
Glucose-Capillary: 172 mg/dL — ABNORMAL HIGH (ref 70–99)
Glucose-Capillary: 167 mg/dL — ABNORMAL HIGH (ref 70–99)
Glucose-Capillary: 109 mg/dL — ABNORMAL HIGH (ref 70–99)
Glucose-Capillary: 127 mg/dL — ABNORMAL HIGH (ref 70–99)

## 2020-10-28 NOTE — Progress Notes (Signed)
[facility] PROGRESS NOTE   Marcus Downs. XTG:[id number] DOB: 1941/10/09 DOA: 11/08/2020  PCP: [name], MD  Brief History/Interval Summary: 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 Luiz Blare 07/2020. He was sent to the ED from his nursing home for worsening altered mental status, agitation, and findings of AKI with electrolyte abnormalities.  CT A/P revealed prominent fluid collection of the left hip, lateral to arthroplasty hardware. [place] was performed of the area, revealed complex fluid collection.  Reason for Visit: Sepsis secondary to MSSA  Consultants: Orthopedics.  Interventional radiology.  Critical care medicine.  Procedures: 12/4: Left hip fluid collection drainage by IR  Antibiotics: Anti-infectives (From admission, onward)   Start     Dose/Rate Route Frequency Ordered Stop   10/26/20 1100  rifampin (RIFADIN) 300 mg in sodium chloride 0.9 % 100 mL IVPB        300 mg 200 mL/hr over 30 Minutes Intravenous Every 12 hours 10/26/20 1007     10/24/20 0815  ceFAZolin (ANCEF) IVPB 2g/100 mL premix        2 g 200 mL/hr over 30 Minutes Intravenous Every 8 hours 10/24/20 0804     10/23/20 2200  ceFAZolin (ANCEF) IVPB 2g/100 mL premix  Status:  Discontinued        2 g 200 mL/hr over 30 Minutes Intravenous Every 12 hours 10/23/20 1406 10/24/20 0804   10/22/20 1000  ceFAZolin (ANCEF) IVPB 1 g/50 mL premix  Status:  Discontinued        1 g 100 mL/hr over 30 Minutes Intravenous Every 12 hours 11/15/2020 1745 10/23/20 1406   10/18/2020 2200  ceFAZolin (ANCEF) IVPB 2g/100 mL premix        2 g 200 mL/hr over 30 Minutes Intravenous  Once 10/27/2020 1739 10/29/2020 2239   11/04/2020 2100  cefTRIAXone (ROCEPHIN) 2 g in sodium chloride 0.9 % 100  mL IVPB  Status:  Discontinued        2 g 200 mL/hr over 30 Minutes Intravenous Every 24 hours 11/12/2020 1117 10/25/2020 1129   11/17/2020 1730  linezolid (ZYVOX) IVPB 600 mg  Status:  Discontinued        600 mg 300 mL/hr over 60 Minutes Intravenous Every 12 hours 11/11/2020 1154 10/19/2020 1739   10/29/2020 1130  cefTRIAXone (ROCEPHIN) 2 g in sodium chloride 0.9 % 100 mL IVPB  Status:  Discontinued        2 g 200 mL/hr over 30 Minutes Intravenous Every 24 hours 10/29/2020 1129 11/07/2020 1739   10/27/2020 1000  meropenem (MERREM) 500 mg in sodium chloride 0.9 % 100 mL IVPB  Status:  Discontinued        500 mg 200 mL/hr over 30 Minutes Intravenous Every 12 hours 11/16/2020 0330 10/23/2020 1117   11/04/2020 0400  linezolid (ZYVOX) IVPB 600 mg        600 mg 300 mL/hr over 60 Minutes Intravenous  Once 10/29/2020 0309 10/18/2020 0654   10/22/2020 0215  ceFEPIme (MAXIPIME) 2 g in sodium chloride 0.9 % 100 mL IVPB        2 g 200 mL/hr over 30 Minutes Intravenous  Once 10/26/2020 0212 11/08/2020 0306   10/27/2020 0215  ampicillin-sulbactam (UNASYN) 1.5 g in sodium chloride 0.9 % 100 mL IVPB  Status:  Discontinued        1.5 g 200 mL/hr over 30 Minutes Intravenous  Once 11/02/2020 0212 10/29/2020 0214   11/04/2020 0215  vancomycin (VANCOCIN) IVPB 1000 mg/200 mL premix  Status:  Discontinued        1,000 mg 200 mL/hr over 60 Minutes Intravenous  Once 10/27/2020 2841 10/25/2020 0314      Subjective/Interval History: Patient remains lethargic although he is noted to be more communicative today.  He opened his eyes and he repeated my name when I told him who I was.     Assessment/Plan:  MSSA bacteremia/sepsis secondary to MSSA/left hip abscess/septic shock Patient was in the intensive care unit due to septic shock.  He required pressors.  Was eventually weaned off of pressors.  Cultures are growing MSSA.   Patient currently on cefazolin.  Rifampin was added by ID on 12/9.  Plan is for 6 weeks of IV cefazolin and oral rifampin followed by  protracted indefinite course of oral Keflex and oral rifampin. Echocardiogram does not show any vegetation. Blood cultures repeated on 12/5 and without any growth so far. Patient not a good candidate for operative intervention of the left hip.  Interventional radiology was consulted and abscess has been drained. Continue to watch drain output. Plan is to repeat CT scan when output has decreased. Orthopedics and interventional radiology continues to follow.  SVT/sinus tachycardia Patient remained persistently in sinus tachycardia despite escalating doses of metoprolol.   EKGs were reviewed with cardiology to make sure there is no other arrhythmia.  They also feel that this is all sinus tachycardia.  Likely being driven by his acute illness.   Patient was started on Cardizem yesterday which may perhaps work better than the metoprolol.  Heart rate seems to be better.  Will increase the dose of Cardizem and slowly wean him off of metoprolol.  TSH was normal at 1.6.   Echocardiogram was also done as discussed above.   Systolic function was noted to be normal.  No significant valvular abnormalities noted.  Acute metabolic encephalopathy in the setting of history of stroke and vascular dementia Patient's baseline mentation not entirely clear.  Discussed with son who mentions that over the past 3 months patient has had a decline in his mentation.   Recent brain MRI did not show any acute process. Acute illness likely contributing to his encephalopathy along with the baseline dementia.   Patient had not shown any improvement over the last several days.  Today he appears to be a little bit more communicative.  Perhaps we are seeing some signs of recovery.  Continue to monitor closely.  If there is no appreciable improvement or if patient declines again will involve palliative care.    Acute kidney injury/hypernatremia Likely secondary to sepsis and prerenal acidemia.  Renal function has improved.   We  increased the dose of free water down has NG tube.  Sodium level is now normal.  Potassium is normal as well.  Recheck her sodium levels tomorrow.  May need to cut back on the dose of free water.  Microcytic anemia History of iron deficiency is noted.  Drop in hemoglobin noted today which is likely dilutional.  No overt bleeding noted.  Recheck tomorrow.    Transaminitis Thought to be due to septic shock.  Although AST was not noted to be significantly elevated.  LFTs remain elevated but stable.  Continue to monitor periodically.  Diabetes mellitus type 2, uncontrolled with hyperglycemia/hypoglycemia HbA1c 8.1 when last checked.  CBGs stable over the last 24 hours.  No further episodes of hypoglycemia.  Dose of insulin was decreased yesterday.  Continue current dose for now.  He is on Lantus and SSI as well as scheduled NovoLog.  History of dysphagia Currently on tube feedings via NG tube.  Not yet ready for swallow evaluation due to altered mental status.  Goals of care Long discussion with patient's son regarding patient's trajectory over the last several months.  He was told about his severity of illness.  Also told about the fact that patient has a history of vascular dementia which will not get any better.  Despite all of this patient's son wants to continue full scope of care for now. Patient's prognosis seems to be guarded at this time. He seems to be showing some improvement in his mentation today.  Unclear if this will be a sustained improvement. If there is no significant improvement over the next 2 days may need to involve palliative care to have another discussion with patient's son and perhaps the rest of the family.  Pressure injury Pressure Injury 08/07/20 Buttocks Right;Medial;Left Stage 2 -  Partial thickness loss of dermis presenting as a shallow open injury with a red, pink wound bed without slough. (Active)  08/07/20 1914  Location: Buttocks  Location Orientation:  Right;Medial;Left  Staging: Stage 2 -  Partial thickness loss of dermis presenting as a shallow open injury with a red, pink wound bed without slough.  Wound Description (Comments):   Present on Admission: Yes    Severe malnutrition Nutrition Problem: Severe Malnutrition Etiology: chronic illness (dementia)  Signs/Symptoms: severe fat depletion,severe muscle depletion,percent weight loss (15.4% weight loss in 9 months) Percent weight loss: 15.4 % (less than 9 months)  Interventions: Tube feeding,Prostat,MVI   DVT Prophylaxis: Subcutaneous heparin Code Status: Full code.  This was discussed with patient's son in detail. Family Communication: Son being updated periodically.  Last updated on 12/10. Disposition Plan: Return to SNF when improved  Status is: Inpatient  Remains inpatient appropriate because:Altered mental status, IV treatments appropriate due to intensity of illness or inability to take PO and Inpatient level of care appropriate due to severity of illness   Dispo:  Patient From: Skilled Nursing Facility  Planned Disposition: Skilled Nursing Facility  Expected discharge date: 10/30/2020  Medically stable for discharge: No      Medications:  Scheduled: . brimonidine  1 drop Both Eyes TID  . Chlorhexidine Gluconate Cloth  6 each Topical Daily  . diltiazem  30 mg Per Tube Q8H  . feeding supplement (PROSource TF)  45 mL Per Tube Daily  . free water  400 mL Per Tube Q4H  . heparin injection (subcutaneous)  5,000 Units Subcutaneous Q8H  . insulin aspart  0-15 Units Subcutaneous Q4H  . insulin aspart  4 Units Subcutaneous Q4H  . insulin glargine  25 Units Subcutaneous BID  . latanoprost  1 drop Both Eyes QHS  . metoprolol tartrate  50 mg Per Tube Q8H  . pantoprazole (PROTONIX) IV  40 mg Intravenous QHS  . sodium chloride flush  5 mL Intracatheter Q8H   Continuous: . sodium chloride Stopped (10/25/20 0603)  .  ceFAZolin (ANCEF) IV 2 g (10/28/20 1015)  . feeding  supplement (OSMOLITE 1.2 CAL) 1,000 mL (10/28/20 0203)  . rifampin (RIFADIN) IVPB 300 mg (10/27/20 2337)   ZOX:WRUEAV chloride, acetaminophen, docusate, HYDROmorphone (DILAUDID) injection, metoprolol tartrate, oxyCODONE, [START ON 10/30/2020] pneumococcal 23 valent vaccine, polyethylene glycol   Objective:  Vital Signs  Vitals:   10/27/20 2340 10/27/20 2342 10/28/20 0306 10/28/20 1012  BP: (!) 148/55 (!) 148/55 109/60 (!) 136/56  Pulse: (!) 128 (!) 128 (!) 110 (!) 120  Resp:   18   Temp:   98.5 F (36.9 C)   TempSrc:      SpO2:   99%   Weight:      Height:        Intake/Output Summary (Last 24 hours) at 10/28/2020 1101 Last data filed at 10/28/2020 0145 Gross per 24 hour  Intake 200 ml  Output 2200 ml  Net -2000 ml   Filed Weights   10/24/20 0500 10/25/20 0332 10/26/20 0815  Weight: 70 kg 69.3 kg 69.5 kg    General appearance: Lethargic but more arousable today. Resp: Mildly tachypneic.  Diminished air entry at the bases.  No wheezing or rhonchi.  Few crackles. Cardio: S1-S2 is tachycardic regular. No S3-S4.  No rubs murmurs or bruit.  Sinus rhythm several hours sinus tachycardia.  Heart rate seems to be better today compared to yesterday. Ranging between 110-120 as opposed to 130's yesterday GI: Abdomen is soft.  Nontender nondistended.  Bowel sounds are present normal.  No masses organomegaly Extremities: No edema.   Neurologic:  No new focal neurological deficits.        Lab Results:  Data Reviewed: I have personally reviewed following labs and imaging studies  CBC: Recent Labs  Lab 10/22/20 0512 10/23/20 0454 10/24/20 0132 10/25/20 0055 10/26/20 0212 10/28/20 0048  WBC 9.9 8.0 7.1 8.5 8.9 9.0  NEUTROABS 8.1* 5.8 5.5 6.6 6.5  --   HGB 10.5* 10.1* 10.7* 11.4* 10.8* 8.8*  HCT 29.9* 29.1* 30.6* 33.8* 31.1* 25.7*  MCV 74.9* 75.4* 74.8* 76.3* 76.0* 75.1*  PLT 210 165 166 167 155 200    Basic Metabolic Panel: Recent Labs  Lab 10/22/20 0512  10/22/20 0933 10/22/20 1841 10/23/20 0454 10/23/20 1807 10/24/20 0132 10/25/20 0543 10/25/20 1323 10/26/20 0212 10/27/20 0244 10/28/20 0048  NA 146*  --   --  146*  --    < > 153* 151* 150* 147* 141  K 4.9  --   --  3.8  --    < > 4.1 4.7 4.8 3.8 4.0  CL 115*  --   --  117*  --    < > 121* 120* 118* 116* 112*  CO2 20*  --   --  17*  --    < > 23 21* 23 23 20*  GLUCOSE 202*  --   --  342*  --    < > 231* 282* 272* 192* 276*  BUN 75*  --   --  75*  --    < > 46* 38* 33* 25* 22  CREATININE 3.16*  --   --  2.07*  --    < > 1.23 1.18 1.07 0.97 0.95  CALCIUM 8.0*  --   --  8.0*  --    < > 8.3* 8.4* 8.2* 7.7* 7.3*  MG 2.4 2.4 2.6* 2.6* 2.5*  --   --   --   --   --   --   PHOS 4.5 4.5 4.1 3.5 3.0  --   --   --   --   --   --    < > = values in this interval not displayed.    GFR: Estimated Creatinine Clearance: 63 mL/min (by C-G formula based on SCr of 0.95 mg/dL).  Liver Function Tests: Recent  Labs  Lab 10/22/20 0512 10/25/20 0543 10/26/20 0212 10/27/20 0244  AST 70* 153* 132* 71*  ALT 39 45* 47* 30  ALKPHOS 138* 188* 189* 176*  BILITOT 0.5 0.8 0.5 1.1  PROT 6.4* 6.8 6.8 6.5  ALBUMIN 1.2* 1.5* 1.3* 1.3*     CBG: Recent Labs  Lab 10/27/20 1628 10/27/20 2116 10/28/20 0127 10/28/20 0541 10/28/20 0742  GLUCAP 177* 244* 252* 220* 172*      Recent Results (from the past 240 hour(s))  Culture, blood (Routine x 2)     Status: Abnormal   Collection Time: 2020-11-14  1:00 AM   Specimen: BLOOD  Result Value Ref Range Status   Specimen Description BLOOD SITE NOT SPECIFIED  Final   Special Requests   Final    BOTTLES DRAWN AEROBIC AND ANAEROBIC Blood Culture adequate volume   Culture  Setup Time   Final    GRAM POSITIVE COCCI IN CLUSTERS IN BOTH AEROBIC AND ANAEROBIC BOTTLES CRITICAL VALUE NOTED.  VALUE IS CONSISTENT WITH PREVIOUSLY REPORTED AND CALLED VALUE.    Culture (A)  Final    STAPHYLOCOCCUS AUREUS SUSCEPTIBILITIES PERFORMED ON PREVIOUS CULTURE WITHIN THE LAST  5 DAYS. Performed at [facility], [address] [place], [place] [address]    Report Status 10/23/2020 FINAL  Final  Culture, blood (Routine x 2)     Status: Abnormal   Collection Time: 11-14-2020  1:00 AM   Specimen: BLOOD  Result Value Ref Range Status   Specimen Description BLOOD SITE NOT SPECIFIED  Final   Special Requests   Final    BOTTLES DRAWN AEROBIC AND ANAEROBIC Blood Culture results may not be optimal due to an inadequate volume of blood received in culture bottles   Culture  Setup Time   Final    GRAM POSITIVE COCCI IN CLUSTERS IN BOTH AEROBIC AND ANAEROBIC BOTTLES CRITICAL RESULT CALLED TO, READ BACK BY AND VERIFIED WITH: E MARTEN,PHARMD AT 1717 11/14/2020 BY L BENFIELD Performed at [facility], [address] [place] [address] [place], [place] [address]    Culture STAPHYLOCOCCUS AUREUS (A)  Final   Report Status 10/23/2020 FINAL  Final   Organism ID, Bacteria STAPHYLOCOCCUS AUREUS  Final      Susceptibility   Staphylococcus aureus - MIC*    CIPROFLOXACIN >=8 RESISTANT Resistant     ERYTHROMYCIN <=0.25 SENSITIVE Sensitive     GENTAMICIN <=0.5 SENSITIVE Sensitive     OXACILLIN 0.5 SENSITIVE Sensitive     TETRACYCLINE <=1 SENSITIVE Sensitive     VANCOMYCIN <=0.5 SENSITIVE Sensitive     TRIMETH/SULFA <=10 SENSITIVE Sensitive     CLINDAMYCIN <=0.25 SENSITIVE Sensitive     RIFAMPIN <=0.5 SENSITIVE Sensitive     Inducible Clindamycin NEGATIVE Sensitive     * STAPHYLOCOCCUS AUREUS  Blood Culture ID Panel (Reflexed)     Status: Abnormal   Collection Time: November 14, 2020  1:00 AM  Result Value Ref Range Status   Enterococcus faecalis NOT DETECTED NOT DETECTED Final   Enterococcus Faecium NOT DETECTED NOT DETECTED Final   Listeria monocytogenes NOT DETECTED NOT DETECTED Final   Staphylococcus species DETECTED (A) NOT DETECTED Final    Comment: CRITICAL RESULT CALLED TO, READ BACK BY AND VERIFIED WITH: E MARTEN,PHARMD AT 1717 November 14, 2020 BY L BENFIELD    Staphylococcus aureus (BCID)  DETECTED (A) NOT DETECTED Final    Comment: CRITICAL RESULT CALLED TO, READ BACK BY AND VERIFIED WITH: E MARTEN,PHARMD AT 1717 November 14, 2020 BY L BENFIELD    Staphylococcus epidermidis NOT  DETECTED NOT DETECTED Final   Staphylococcus lugdunensis NOT DETECTED NOT DETECTED Final   Streptococcus species NOT DETECTED NOT DETECTED Final   Streptococcus agalactiae NOT DETECTED NOT DETECTED Final   Streptococcus pneumoniae NOT DETECTED NOT DETECTED Final   Streptococcus pyogenes NOT DETECTED NOT DETECTED Final   A.calcoaceticus-baumannii NOT DETECTED NOT DETECTED Final   Bacteroides fragilis NOT DETECTED NOT DETECTED Final   Enterobacterales NOT DETECTED NOT DETECTED Final   Enterobacter cloacae complex NOT DETECTED NOT DETECTED Final   Escherichia coli NOT DETECTED NOT DETECTED Final   Klebsiella aerogenes NOT DETECTED NOT DETECTED Final   Klebsiella oxytoca NOT DETECTED NOT DETECTED Final   Klebsiella pneumoniae NOT DETECTED NOT DETECTED Final   Proteus species NOT DETECTED NOT DETECTED Final   Salmonella species NOT DETECTED NOT DETECTED Final   Serratia marcescens NOT DETECTED NOT DETECTED Final   Haemophilus influenzae NOT DETECTED NOT DETECTED Final   Neisseria meningitidis NOT DETECTED NOT DETECTED Final   Pseudomonas aeruginosa NOT DETECTED NOT DETECTED Final   Stenotrophomonas maltophilia NOT DETECTED NOT DETECTED Final   Candida albicans NOT DETECTED NOT DETECTED Final   Candida auris NOT DETECTED NOT DETECTED Final   Candida glabrata NOT DETECTED NOT DETECTED Final   Candida krusei NOT DETECTED NOT DETECTED Final   Candida parapsilosis NOT DETECTED NOT DETECTED Final   Candida tropicalis NOT DETECTED NOT DETECTED Final   Cryptococcus neoformans/gattii NOT DETECTED NOT DETECTED Final   Meth resistant mecA/C and MREJ NOT DETECTED NOT DETECTED Final    Comment: Performed at [facility], [address] [place], [place] [address]  Resp Panel by RT-PCR (Flu A&B, Covid)  Nasopharyngeal Swab     Status: None   Collection Time: 10/24/2020  2:46 AM   Specimen: Nasopharyngeal Swab; Nasopharyngeal(NP) swabs in vial transport medium  Result Value Ref Range Status   SARS Coronavirus 2 by RT PCR NEGATIVE NEGATIVE Final    Comment: (NOTE) SARS-CoV-2 target nucleic acids are NOT DETECTED.  The SARS-CoV-2 RNA is generally detectable in upper respiratory specimens during the acute phase of infection. The lowest concentration of SARS-CoV-2 viral copies this assay can detect is 138 copies/mL. A negative result does not preclude SARS-Cov-2 infection and should not be used as the sole basis for treatment or other patient management decisions. A negative result may occur with  improper specimen collection/handling, submission of specimen other than nasopharyngeal swab, presence of viral mutation(s) within the areas targeted by this assay, and inadequate number of viral copies(<138 copies/mL). A negative result must be combined with clinical observations, patient history, and epidemiological information. The expected result is Negative.  Fact Sheet for Patients:  BloggerCourse.com  Fact Sheet for Healthcare Providers:  SeriousBroker.it  This test is no t yet approved or cleared by the [place] FDA and  has been authorized for detection and/or diagnosis of SARS-CoV-2 by FDA under an Emergency Use Authorization (EUA). This EUA will remain  in effect (meaning this test can be used) for the duration of the COVID-19 declaration under Section 564(b)(1) of the Act, 21 U.S.C.section 360bbb-3(b)(1), unless the authorization is terminated  or revoked sooner.       Influenza A by PCR NEGATIVE NEGATIVE Final   Influenza B by PCR NEGATIVE NEGATIVE Final    Comment: (NOTE) The Xpert Xpress SARS-CoV-2/FLU/RSV plus assay is intended as an aid in the diagnosis of influenza from Nasopharyngeal swab specimens and should not be  used as a sole basis for treatment. Nasal washings and aspirates are  unacceptable for Xpert Xpress SARS-CoV-2/FLU/RSV testing.  Fact Sheet for Patients: BloggerCourse.comhttps://www.fda.gov/media/152166/download  Fact Sheet for Healthcare Providers: SeriousBroker.ithttps://www.fda.gov/media/152162/download  This test is not yet approved or cleared by the [place] FDA and has been authorized for detection and/or diagnosis of SARS-CoV-2 by FDA under an Emergency Use Authorization (EUA). This EUA will remain in effect (meaning this test can be used) for the duration of the COVID-19 declaration under Section 564(b)(1) of the Act, 21 U.S.C. section 360bbb-3(b)(1), unless the authorization is terminated or revoked.  Performed at [facility], [address] [place], [place] [address]   Urine culture     Status: Abnormal   Collection Time: 03-05-20  2:48 AM   Specimen: In/Out Cath Urine  Result Value Ref Range Status   Specimen Description IN/OUT CATH URINE  Final   Special Requests   Final    NONE Performed at [facility], [address] [place], [place] [address]    Culture >=100,000 COLONIES/mL STAPHYLOCOCCUS AUREUS (A)  Final   Report Status 10/23/2020 FINAL  Final   Organism ID, Bacteria STAPHYLOCOCCUS AUREUS (A)  Final      Susceptibility   Staphylococcus aureus - MIC*    CIPROFLOXACIN >=8 RESISTANT Resistant     GENTAMICIN <=0.5 SENSITIVE Sensitive     NITROFURANTOIN 32 SENSITIVE Sensitive     OXACILLIN 0.5 SENSITIVE Sensitive     TETRACYCLINE <=1 SENSITIVE Sensitive     VANCOMYCIN <=0.5 SENSITIVE Sensitive     TRIMETH/SULFA <=10 SENSITIVE Sensitive     CLINDAMYCIN <=0.25 SENSITIVE Sensitive     RIFAMPIN <=0.5 SENSITIVE Sensitive     Inducible Clindamycin NEGATIVE Sensitive     * >=100,000 COLONIES/mL STAPHYLOCOCCUS AUREUS  Aerobic/Anaerobic Culture (surgical/deep wound)     Status: None   Collection Time: 03-05-20  1:47 PM   Specimen: Abscess  Result Value Ref Range Status    Specimen Description ABSCESS  Final   Special Requests LEFT THIGH/HIP ABSC  Final   Gram Stain   Final    FEW WBC PRESENT, PREDOMINANTLY PMN MODERATE GRAM POSITIVE COCCI IN PAIRS IN CLUSTERS    Culture   Final    ABUNDANT STAPHYLOCOCCUS AUREUS NO ANAEROBES ISOLATED Performed at [facility], [address] [place], [place] [address]    Report Status 10/26/2020 FINAL  Final   Organism ID, Bacteria STAPHYLOCOCCUS AUREUS  Final      Susceptibility   Staphylococcus aureus - MIC*    CIPROFLOXACIN >=8 RESISTANT Resistant     ERYTHROMYCIN <=0.25 SENSITIVE Sensitive     GENTAMICIN <=0.5 SENSITIVE Sensitive     OXACILLIN 0.5 SENSITIVE Sensitive     TETRACYCLINE <=1 SENSITIVE Sensitive     VANCOMYCIN <=0.5 SENSITIVE Sensitive     TRIMETH/SULFA <=10 SENSITIVE Sensitive     CLINDAMYCIN <=0.25 SENSITIVE Sensitive     RIFAMPIN <=0.5 SENSITIVE Sensitive     Inducible Clindamycin NEGATIVE Sensitive     * ABUNDANT STAPHYLOCOCCUS AUREUS  MRSA PCR Screening     Status: None   Collection Time: 03-05-20  3:12 PM   Specimen: Nasopharyngeal  Result Value Ref Range Status   MRSA by PCR NEGATIVE NEGATIVE Final    Comment:        The GeneXpert MRSA Assay (FDA approved for NASAL specimens only), is one component of a comprehensive MRSA colonization surveillance program. It is not intended to diagnose MRSA infection nor to guide or monitor treatment for MRSA infections. Performed at [facility], [address] [place], [place] [address]   Culture, blood (routine x 2)     Status: None   Collection Time: 10/22/20 11:40 AM   Specimen: BLOOD LEFT ARM  Result Value Ref Range Status   Specimen Description BLOOD LEFT ARM  Final   Special Requests   Final    BOTTLES DRAWN AEROBIC AND ANAEROBIC Blood Culture adequate volume   Culture   Final    NO GROWTH 5 DAYS Performed at [facility], [address], [place], [place] [address]    Report Status 10/27/2020 FINAL  Final   Culture, blood (routine x 2)     Status: None   Collection Time: 10/22/20 11:55 AM   Specimen: BLOOD LEFT HAND  Result Value Ref Range Status   Specimen Description BLOOD LEFT HAND  Final   Special Requests   Final    BOTTLES DRAWN AEROBIC AND ANAEROBIC Blood Culture adequate volume   Culture   Final    NO GROWTH 5 DAYS Performed at [facility], [address], [place], [place] [address]    Report Status 10/27/2020 FINAL  Final      Radiology Studies: No results found.     LOS: 7 days   Tyrin Herbers [employer] on [email or web address]  10/28/2020, 11:01 AM

## 2020-10-29 LAB — BASIC METABOLIC PANEL
Anion gap: 8 (ref 5–15)
BUN: 19 mg/dL (ref 8–23)
CO2: 24 mmol/L (ref 22–32)
Calcium: 7.7 mg/dL — ABNORMAL LOW (ref 8.9–10.3)
Chloride: 110 mmol/L (ref 98–111)
Creatinine, Ser: 0.88 mg/dL (ref 0.61–1.24)
GFR, Estimated: >60 mL/min (ref >60)
Glucose, Bld: 132 mg/dL — ABNORMAL HIGH (ref 70–99)
Potassium: 4.8 mmol/L (ref 3.5–5.1)
Sodium: 142 mmol/L (ref 135–145)

## 2020-10-29 LAB — GLUCOSE, CAPILLARY
Glucose-Capillary: 127 mg/dL — ABNORMAL HIGH (ref 70–99)
Glucose-Capillary: 130 mg/dL — ABNORMAL HIGH (ref 70–99)
Glucose-Capillary: 140 mg/dL — ABNORMAL HIGH (ref 70–99)
Glucose-Capillary: 145 mg/dL — ABNORMAL HIGH (ref 70–99)
Glucose-Capillary: 163 mg/dL — ABNORMAL HIGH (ref 70–99)
Glucose-Capillary: 237 mg/dL — ABNORMAL HIGH (ref 70–99)
Glucose-Capillary: 83 mg/dL (ref 70–99)

## 2020-10-29 LAB — CORTISOL-AM, BLOOD: Cortisol - AM: 12.2 ug/dL (ref 6.7–22.6)

## 2020-10-29 MED ORDER — METOPROLOL TARTRATE 25 MG/10 ML ORAL SUSPENSION
50.0000 mg | Freq: Two times a day (BID) | ORAL | Status: DC
  Administered 2020-10-29 – 2020-11-08 (×21): 50 mg
  Filled 2020-10-29 (×20): qty 20

## 2020-10-29 MED ORDER — DILTIAZEM 12 MG/ML ORAL SUSPENSION
60.0000 mg | Freq: Three times a day (TID) | ORAL | Status: DC
  Administered 2020-10-29 – 2020-11-08 (×31): 60 mg
  Filled 2020-10-29 (×32): qty 6

## 2020-10-29 NOTE — Progress Notes (Addendum)
[facility] PROGRESS NOTE   Marcus Downs. 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 Luiz Blare 07/2020. He was sent to the ED from his nursing home for worsening altered mental status, agitation, and findings of AKI with electrolyte abnormalities.  CT A/P revealed prominent fluid collection of the left hip, lateral to arthroplasty hardware. [place] was performed of the area, revealed complex fluid collection.  Reason for Visit: Sepsis secondary to MSSA  Consultants: Orthopedics.  Interventional radiology.  Critical care medicine.  Procedures: 12/4: Left hip fluid collection drainage by IR  Antibiotics: Anti-infectives (From admission, onward)   Start     Dose/Rate Route Frequency Ordered Stop   10/26/20 1100  rifampin (RIFADIN) 300 mg in sodium chloride 0.9 % 100 mL IVPB        300 mg 200 mL/hr over 30 Minutes Intravenous Every 12 hours 10/26/20 1007     10/24/20 0815  ceFAZolin (ANCEF) IVPB 2g/100 mL premix        2 g 200 mL/hr over 30 Minutes Intravenous Every 8 hours 10/24/20 0804     10/23/20 2200  ceFAZolin (ANCEF) IVPB 2g/100 mL premix  Status:  Discontinued        2 g 200 mL/hr over 30 Minutes Intravenous Every 12 hours 10/23/20 1406 10/24/20 0804   10/22/20 1000  ceFAZolin (ANCEF) IVPB 1 g/50 mL premix  Status:  Discontinued        1 g 100 mL/hr over 30 Minutes Intravenous Every 12 hours November 05, 2020 1745 10/23/20 1406   2020/11/05 2200  ceFAZolin (ANCEF) IVPB 2g/100 mL premix        2 g 200 mL/hr over 30 Minutes Intravenous  Once 11-05-2020 1739 11-05-20 2239   11/05/20 2100  cefTRIAXone (ROCEPHIN) 2 g in sodium chloride 0.9 % 100  mL IVPB  Status:  Discontinued        2 g 200 mL/hr over 30 Minutes Intravenous Every 24 hours 11-05-2020 1117 Nov 05, 2020 1129   11-05-20 1730  linezolid (ZYVOX) IVPB 600 mg  Status:  Discontinued        600 mg 300 mL/hr over 60 Minutes Intravenous Every 12 hours 05-Nov-2020 1154 November 05, 2020 1739   Nov 05, 2020 1130  cefTRIAXone (ROCEPHIN) 2 g in sodium chloride 0.9 % 100 mL IVPB  Status:  Discontinued        2 g 200 mL/hr over 30 Minutes Intravenous Every 24 hours 11-05-2020 1129 11/05/2020 1739   Nov 05, 2020 1000  meropenem (MERREM) 500 mg in sodium chloride 0.9 % 100 mL IVPB  Status:  Discontinued        500 mg 200 mL/hr over 30 Minutes Intravenous Every 12 hours 11-05-2020 0330 November 05, 2020 1117   11-05-20 0400  linezolid (ZYVOX) IVPB 600 mg        600 mg 300 mL/hr over 60 Minutes Intravenous  Once 11-05-20 0309 Nov 05, 2020 0654   11/05/20 0215  ceFEPIme (MAXIPIME) 2 g in sodium chloride 0.9 % 100 mL IVPB        2 g 200 mL/hr over 30 Minutes Intravenous  Once 11/05/20 0212 Nov 05, 2020 0306   11/05/2020 0215  ampicillin-sulbactam (UNASYN) 1.5 g in sodium chloride 0.9 % 100 mL IVPB  Status:  Discontinued        1.5 g 200 mL/hr over 30 Minutes Intravenous  Once 11/30/19 0212 11/30/19 0214   11/30/19 0215  vancomycin (VANCOCIN) IVPB 1000 mg/200 mL premix  Status:  Discontinued        1,000 mg 200 mL/hr over 60 Minutes Intravenous  Once 11/30/19 [id number] 11/30/19 0314      Subjective/Interval History: Patient remains lethargic but has been more responsive over the last 48 hours.  Still not very communicative however.  He did follow a few commands.  He did move his right leg when I asked him to.   Assessment/Plan:  MSSA bacteremia/sepsis secondary to MSSA/left hip abscess/septic shock Patient was in the intensive care unit due to septic shock.  He required pressors.  Was eventually weaned off of pressors.  Cultures are growing MSSA.   Patient currently on cefazolin.  Rifampin was added by ID on 12/9.  Plan is for 6 weeks  of IV cefazolin and oral rifampin followed by protracted indefinite course of oral Keflex and oral rifampin. Echocardiogram does not show any vegetation. Blood cultures repeated on 12/5 and without any growth so far. Patient not considered a good candidate for operative intervention of the left hip.  Interventional radiology was consulted and abscess has been drained. Continue to watch drain output. Plan is to repeat CT scan when output has decreased. Orthopedics and interventional radiology continues to follow.  SVT/sinus tachycardia EKGs were reviewed with cardiology to make sure there is no other arrhythmia.  They also feel that this is all sinus tachycardia.  Likely being driven by his acute illness. Heart rate remained elevated despite escalating doses of metoprolol.  Subsequently patient was started on Cardizem.  That seems to be helping as heart rate seems to be better.  Will increase the dose of Cardizem and slowly wean him off of metoprolol.  TSH was normal at 1.6. Echocardiogram was also done as discussed above.   Systolic function was noted to be normal.  No significant valvular abnormalities noted.  Acute metabolic encephalopathy in the setting of history of stroke and vascular dementia Patient's baseline mentation not entirely clear.  Discussed with son who mentions that over the past 3 months patient has had a decline in his mentation.   Recent brain MRI did not show any acute process. Acute illness likely contributing to his encephalopathy along with the baseline dementia.  Cortisol level noted to be normal. Over the last 48 hours patient has been little bit more responsive and following commands a bit more than before.  Perhaps we are seeing some signs of recovery.  Continue to monitor closely.  Acute kidney injury/hypernatremia Likely secondary to sepsis and prerenal acidemia.  Renal function has improved.   We increased the dose of free water down has NG tube.  Odium and potassium  levels remain normal.  Monitor urine output.    Microcytic anemia History of iron deficiency is noted.  Drop in hemoglobin noted yesterday which was likely dilutional.  No overt bleeding noted.  We will recheck tomorrow.  Transaminitis Thought to be due to septic shock.  Although AST was not noted to be significantly elevated.  LFTs remain elevated but stable.  Continue to monitor periodically.  Diabetes mellitus type 2, uncontrolled with hyperglycemia/hypoglycemia HbA1c 8.1 when last checked.  No further episodes of hypoglycemia noted.  CBGs are stable.  Will not change insulin dosage at this time.  Continue Lantus and SSI as well as scheduled NovoLog.  History of  dysphagia Currently on tube feedings via NG tube.  Not yet ready for swallow evaluation due to altered mental status.  However his mentation seems to be gradually improving.  Speech therapy has been seeing him every so often.  Goals of care Have had multiple discussions with patient's son regarding patient's trajectory over the last several months.  He was told about his severity of illness.  Also told about the fact that patient has a history of vascular dementia which will not get any better.  Despite all of this patient's son wants to continue full scope of care for now. Even though patient seems to be showing some signs of improvement his prognosis still remains guarded. Discussed with the son today.  He visited his father yesterday and saw how sick he was.  He understands that patient may not ever recover.  He requests a DNR status.  Pressure injury Pressure Injury 08/07/20 Buttocks Right;Medial;Left Stage 2 -  Partial thickness loss of dermis presenting as a shallow open injury with a red, pink wound bed without slough. (Active)  08/07/20 1914  Location: Buttocks  Location Orientation: Right;Medial;Left  Staging: Stage 2 -  Partial thickness loss of dermis presenting as a shallow open injury with a red, pink wound bed without  slough.  Wound Description (Comments):   Present on Admission: Yes    Severe malnutrition Nutrition Problem: Severe Malnutrition Etiology: chronic illness (dementia)  Signs/Symptoms: severe fat depletion,severe muscle depletion,percent weight loss (15.4% weight loss in 9 months) Percent weight loss: 15.4 % (less than 9 months)  Interventions: Tube feeding,Prostat,MVI   DVT Prophylaxis: Subcutaneous heparin Code Status: Today his Son requested we change him to DNR status.  He saw his father yesterday and does not want him to undergo any kind of heroic measures. Family Communication: Son being updated periodically.  Last updated on 12/12. Disposition Plan: Return to SNF when improved  Status is: Inpatient  Remains inpatient appropriate because:Altered mental status, IV treatments appropriate due to intensity of illness or inability to take PO and Inpatient level of care appropriate due to severity of illness   Dispo:  Patient From: Skilled Nursing Facility  Planned Disposition: Skilled Nursing Facility  Expected discharge date: 10/30/2020  Medically stable for discharge: No      Medications:  Scheduled:  brimonidine  1 drop Both Eyes TID   Chlorhexidine Gluconate Cloth  6 each Topical Daily   diltiazem  30 mg Per Tube Q8H   feeding supplement (PROSource TF)  45 mL Per Tube Daily   free water  400 mL Per Tube Q4H   heparin injection (subcutaneous)  5,000 Units Subcutaneous Q8H   insulin aspart  0-15 Units Subcutaneous Q4H   insulin aspart  4 Units Subcutaneous Q4H   insulin glargine  25 Units Subcutaneous BID   latanoprost  1 drop Both Eyes QHS   metoprolol tartrate  50 mg Per Tube Q8H   pantoprazole (PROTONIX) IV  40 mg Intravenous QHS   sodium chloride flush  5 mL Intracatheter Q8H   Continuous:  sodium chloride Stopped (10/25/20 0603)    ceFAZolin (ANCEF) IV 2 g (10/29/20 0527)   feeding supplement (OSMOLITE 1.2 CAL) 1,000 mL (10/28/20 0203)    rifampin (RIFADIN) IVPB 300 mg (10/28/20 2145)   WUJ:WJXBJY chloride, acetaminophen, docusate, HYDROmorphone (DILAUDID) injection, metoprolol tartrate, oxyCODONE, [START ON 10/30/2020] pneumococcal 23 valent vaccine, polyethylene glycol   Objective:  Vital Signs  Vitals:   10/28/20 1857 10/28/20 1953 10/28/20 2356 10/29/20 0500  BP: (P)  128/63 120/63 136/63 (!) 149/68  Pulse: (!) (P) 117 (!) 115 (!) 118 (!) 121  Resp: (P) 20 18  20   Temp: (P) 98.2 F (36.8 C) 98.2 F (36.8 C) 97.8 F (36.6 C) 97.7 F (36.5 C)  TempSrc: (P) Oral Axillary Axillary Axillary  SpO2:   100% 100%  Weight:      Height:        Intake/Output Summary (Last 24 hours) at 10/29/2020 0922 Last data filed at 10/29/2020 0500 Gross per 24 hour  Intake --  Output 2310 ml  Net -2310 ml   Filed Weights   10/24/20 0500 10/25/20 0332 10/26/20 0815  Weight: 70 kg 69.3 kg 69.5 kg    General appearance: Lethargic but much more arousable.  Following commands today. Resp: Mildly tachypneic.  Diminished air entry at the bases without any wheezing or rhonchi.  Few crackles.   Cardio: S1-S2 is tachycardic regular.  No S3-S4.  No rubs murmurs or bruit.  Telemetry shows sinus tachycardia.  Heart rate range seems to be better. GI: Abdomen is soft.  Nontender nondistended.  Bowel sounds are present normal.  No masses organomegaly Extremities: No edema.  Neurologic: Known left hemiparesis.  No new deficits.       Lab Results:  Data Reviewed: I have personally reviewed following labs and imaging studies  CBC: Recent Labs  Lab 10/23/20 0454 10/24/20 0132 10/25/20 0055 10/26/20 0212 10/28/20 0048  WBC 8.0 7.1 8.5 8.9 9.0  NEUTROABS 5.8 5.5 6.6 6.5  --   HGB 10.1* 10.7* 11.4* 10.8* 8.8*  HCT 29.1* 30.6* 33.8* 31.1* 25.7*  MCV 75.4* 74.8* 76.3* 76.0* 75.1*  PLT 165 166 167 155 200    Basic Metabolic Panel: Recent Labs  Lab 10/22/20 0933 10/22/20 1841 10/23/20 0454 10/23/20 1807 10/24/20 0132  10/25/20 1323 10/26/20 0212 10/27/20 0244 10/28/20 0048 10/29/20 0313  NA  --   --  146*  --    < > 151* 150* 147* 141 142  K  --   --  3.8  --    < > 4.7 4.8 3.8 4.0 4.8  CL  --   --  117*  --    < > 120* 118* 116* 112* 110  CO2  --   --  17*  --    < > 21* 23 23 20* 24  GLUCOSE  --   --  342*  --    < > 282* 272* 192* 276* 132*  BUN  --   --  75*  --    < > 38* 33* 25* 22 19  CREATININE  --   --  2.07*  --    < > 1.18 1.07 0.97 0.95 0.88  CALCIUM  --   --  8.0*  --    < > 8.4* 8.2* 7.7* 7.3* 7.7*  MG 2.4 2.6* 2.6* 2.5*  --   --   --   --   --   --   PHOS 4.5 4.1 3.5 3.0  --   --   --   --   --   --    < > = values in this interval not displayed.    GFR: Estimated Creatinine Clearance: 68 mL/min (by C-G formula based on SCr of 0.88 mg/dL).  Liver Function Tests: Recent Labs  Lab 10/25/20 0543 10/26/20 0212 10/27/20 0244  AST 153* 132* 71*  ALT 45* 47* 30  ALKPHOS 188* 189* 176*  BILITOT 0.8 0.5 1.1  PROT 6.8 6.8 6.5  ALBUMIN 1.5* 1.3* 1.3*     CBG: Recent Labs  Lab 10/28/20 1659 10/28/20 2131 10/29/20 0005 10/29/20 0329 10/29/20 0743  GLUCAP 109* 127* 140* 130* 145*      Recent Results (from the past 240 hour(s))  Culture, blood (Routine x 2)     Status: Abnormal   Collection Time: 10/20/2020  1:00 AM   Specimen: BLOOD  Result Value Ref Range Status   Specimen Description BLOOD SITE NOT SPECIFIED  Final   Special Requests   Final    BOTTLES DRAWN AEROBIC AND ANAEROBIC Blood Culture adequate volume   Culture  Setup Time   Final    GRAM POSITIVE COCCI IN CLUSTERS IN BOTH AEROBIC AND ANAEROBIC BOTTLES CRITICAL VALUE NOTED.  VALUE IS CONSISTENT WITH PREVIOUSLY REPORTED AND CALLED VALUE.    Culture (A)  Final    STAPHYLOCOCCUS AUREUS SUSCEPTIBILITIES PERFORMED ON PREVIOUS CULTURE WITHIN THE LAST 5 DAYS. Performed at [facility], [address] [place], [place] [address]    Report Status 10/23/2020 FINAL  Final  Culture, blood (Routine x 2)      Status: Abnormal   Collection Time: 10/24/2020  1:00 AM   Specimen: BLOOD  Result Value Ref Range Status   Specimen Description BLOOD SITE NOT SPECIFIED  Final   Special Requests   Final    BOTTLES DRAWN AEROBIC AND ANAEROBIC Blood Culture results may not be optimal due to an inadequate volume of blood received in culture bottles   Culture  Setup Time   Final    GRAM POSITIVE COCCI IN CLUSTERS IN BOTH AEROBIC AND ANAEROBIC BOTTLES CRITICAL RESULT CALLED TO, READ BACK BY AND VERIFIED WITH: E MARTEN,PHARMD AT 1717 11/03/2020 BY L BENFIELD Performed at [facility], [address] [place], [place] [address]    Culture STAPHYLOCOCCUS AUREUS (A)  Final   Report Status 10/23/2020 FINAL  Final   Organism ID, Bacteria STAPHYLOCOCCUS AUREUS  Final      Susceptibility   Staphylococcus aureus - MIC*    CIPROFLOXACIN >=8 RESISTANT Resistant     ERYTHROMYCIN <=0.25 SENSITIVE Sensitive     GENTAMICIN <=0.5 SENSITIVE Sensitive     OXACILLIN 0.5 SENSITIVE Sensitive     TETRACYCLINE <=1 SENSITIVE Sensitive     VANCOMYCIN <=0.5 SENSITIVE Sensitive     TRIMETH/SULFA <=10 SENSITIVE Sensitive     CLINDAMYCIN <=0.25 SENSITIVE Sensitive     RIFAMPIN <=0.5 SENSITIVE Sensitive     Inducible Clindamycin NEGATIVE Sensitive     * STAPHYLOCOCCUS AUREUS  Blood Culture ID Panel (Reflexed)     Status: Abnormal   Collection Time: 11/08/2020  1:00 AM  Result Value Ref Range Status   Enterococcus faecalis NOT DETECTED NOT DETECTED Final   Enterococcus Faecium NOT DETECTED NOT DETECTED Final   Listeria monocytogenes NOT DETECTED NOT DETECTED Final   Staphylococcus species DETECTED (A) NOT DETECTED Final    Comment: CRITICAL RESULT CALLED TO, READ BACK BY AND VERIFIED WITH: E MARTEN,PHARMD AT 1717 11/11/2020 BY L BENFIELD    Staphylococcus aureus (BCID) DETECTED (A) NOT DETECTED Final    Comment: CRITICAL RESULT CALLED TO, READ BACK BY AND VERIFIED WITH: E MARTEN,PHARMD AT 1717 11/10/2020 BY L BENFIELD     Staphylococcus epidermidis NOT DETECTED NOT DETECTED Final   Staphylococcus lugdunensis NOT DETECTED NOT DETECTED Final   Streptococcus species NOT DETECTED NOT DETECTED Final   Streptococcus agalactiae NOT DETECTED NOT DETECTED Final   Streptococcus pneumoniae NOT DETECTED NOT  DETECTED Final   Streptococcus pyogenes NOT DETECTED NOT DETECTED Final   A.calcoaceticus-baumannii NOT DETECTED NOT DETECTED Final   Bacteroides fragilis NOT DETECTED NOT DETECTED Final   Enterobacterales NOT DETECTED NOT DETECTED Final   Enterobacter cloacae complex NOT DETECTED NOT DETECTED Final   Escherichia coli NOT DETECTED NOT DETECTED Final   Klebsiella aerogenes NOT DETECTED NOT DETECTED Final   Klebsiella oxytoca NOT DETECTED NOT DETECTED Final   Klebsiella pneumoniae NOT DETECTED NOT DETECTED Final   Proteus species NOT DETECTED NOT DETECTED Final   Salmonella species NOT DETECTED NOT DETECTED Final   Serratia marcescens NOT DETECTED NOT DETECTED Final   Haemophilus influenzae NOT DETECTED NOT DETECTED Final   Neisseria meningitidis NOT DETECTED NOT DETECTED Final   Pseudomonas aeruginosa NOT DETECTED NOT DETECTED Final   Stenotrophomonas maltophilia NOT DETECTED NOT DETECTED Final   Candida albicans NOT DETECTED NOT DETECTED Final   Candida auris NOT DETECTED NOT DETECTED Final   Candida glabrata NOT DETECTED NOT DETECTED Final   Candida krusei NOT DETECTED NOT DETECTED Final   Candida parapsilosis NOT DETECTED NOT DETECTED Final   Candida tropicalis NOT DETECTED NOT DETECTED Final   Cryptococcus neoformans/gattii NOT DETECTED NOT DETECTED Final   Meth resistant mecA/C and MREJ NOT DETECTED NOT DETECTED Final    Comment: Performed at [facility], [address] [place], [place] [address]  Resp Panel by RT-PCR (Flu A&B, Covid) Nasopharyngeal Swab     Status: None   Collection Time: 10/20/2020  2:46 AM   Specimen: Nasopharyngeal Swab; Nasopharyngeal(NP) swabs in vial transport medium  Result  Value Ref Range Status   SARS Coronavirus 2 by RT PCR NEGATIVE NEGATIVE Final    Comment: (NOTE) SARS-CoV-2 target nucleic acids are NOT DETECTED.  The SARS-CoV-2 RNA is generally detectable in upper respiratory specimens during the acute phase of infection. The lowest concentration of SARS-CoV-2 viral copies this assay can detect is 138 copies/mL. A negative result does not preclude SARS-Cov-2 infection and should not be used as the sole basis for treatment or other patient management decisions. A negative result may occur with  improper specimen collection/handling, submission of specimen other than nasopharyngeal swab, presence of viral mutation(s) within the areas targeted by this assay, and inadequate number of viral copies(<138 copies/mL). A negative result must be combined with clinical observations, patient history, and epidemiological information. The expected result is Negative.  Fact Sheet for Patients:  BloggerCourse.com  Fact Sheet for Healthcare Providers:  SeriousBroker.it  This test is no t yet approved or cleared by the [place] FDA and  has been authorized for detection and/or diagnosis of SARS-CoV-2 by FDA under an Emergency Use Authorization (EUA). This EUA will remain  in effect (meaning this test can be used) for the duration of the COVID-19 declaration under Section 564(b)(1) of the Act, 21 U.S.C.section 360bbb-3(b)(1), unless the authorization is terminated  or revoked sooner.       Influenza A by PCR NEGATIVE NEGATIVE Final   Influenza B by PCR NEGATIVE NEGATIVE Final    Comment: (NOTE) The Xpert Xpress SARS-CoV-2/FLU/RSV plus assay is intended as an aid in the diagnosis of influenza from Nasopharyngeal swab specimens and should not be used as a sole basis for treatment. Nasal washings and aspirates are unacceptable for Xpert Xpress SARS-CoV-2/FLU/RSV testing.  Fact Sheet for  Patients: BloggerCourse.com  Fact Sheet for Healthcare Providers: SeriousBroker.it  This test is not yet approved or cleared by the [place] and has been authorized for  detection and/or diagnosis of SARS-CoV-2 by FDA under an Emergency Use Authorization (EUA). This EUA will remain in effect (meaning this test can be used) for the duration of the COVID-19 declaration under Section 564(b)(1) of the Act, 21 U.S.C. section 360bbb-3(b)(1), unless the authorization is terminated or revoked.  Performed at [facility], [address], [place], [place] [address]   Urine culture     Status: Abnormal   Collection Time: 11/19/20  2:48 AM   Specimen: In/Out Cath Urine  Result Value Ref Range Status   Specimen Description IN/OUT CATH URINE  Final   Special Requests   Final    NONE Performed at [facility], [address] [place], [place] [address]    Culture >=100,000 COLONIES/mL STAPHYLOCOCCUS AUREUS (A)  Final   Report Status 10/23/2020 FINAL  Final   Organism ID, Bacteria STAPHYLOCOCCUS AUREUS (A)  Final      Susceptibility   Staphylococcus aureus - MIC*    CIPROFLOXACIN >=8 RESISTANT Resistant     GENTAMICIN <=0.5 SENSITIVE Sensitive     NITROFURANTOIN 32 SENSITIVE Sensitive     OXACILLIN 0.5 SENSITIVE Sensitive     TETRACYCLINE <=1 SENSITIVE Sensitive     VANCOMYCIN <=0.5 SENSITIVE Sensitive     TRIMETH/SULFA <=10 SENSITIVE Sensitive     CLINDAMYCIN <=0.25 SENSITIVE Sensitive     RIFAMPIN <=0.5 SENSITIVE Sensitive     Inducible Clindamycin NEGATIVE Sensitive     * >=100,000 COLONIES/mL STAPHYLOCOCCUS AUREUS  Aerobic/Anaerobic Culture (surgical/deep wound)     Status: None   Collection Time: 11/19/20  1:47 PM   Specimen: Abscess  Result Value Ref Range Status   Specimen Description ABSCESS  Final   Special Requests LEFT THIGH/HIP ABSC  Final   Gram Stain   Final    FEW WBC PRESENT, PREDOMINANTLY  PMN MODERATE GRAM POSITIVE COCCI IN PAIRS IN CLUSTERS    Culture   Final    ABUNDANT STAPHYLOCOCCUS AUREUS NO ANAEROBES ISOLATED Performed at [facility], [address] [place], [place] [address]    Report Status 10/26/2020 FINAL  Final   Organism ID, Bacteria STAPHYLOCOCCUS AUREUS  Final      Susceptibility   Staphylococcus aureus - MIC*    CIPROFLOXACIN >=8 RESISTANT Resistant     ERYTHROMYCIN <=0.25 SENSITIVE Sensitive     GENTAMICIN <=0.5 SENSITIVE Sensitive     OXACILLIN 0.5 SENSITIVE Sensitive     TETRACYCLINE <=1 SENSITIVE Sensitive     VANCOMYCIN <=0.5 SENSITIVE Sensitive     TRIMETH/SULFA <=10 SENSITIVE Sensitive     CLINDAMYCIN <=0.25 SENSITIVE Sensitive     RIFAMPIN <=0.5 SENSITIVE Sensitive     Inducible Clindamycin NEGATIVE Sensitive     * ABUNDANT STAPHYLOCOCCUS AUREUS  MRSA PCR Screening     Status: None   Collection Time: 2020-11-19  3:12 PM   Specimen: Nasopharyngeal  Result Value Ref Range Status   MRSA by PCR NEGATIVE NEGATIVE Final    Comment:        The GeneXpert MRSA Assay (FDA approved for NASAL specimens only), is one component of a comprehensive MRSA colonization surveillance program. It is not intended to diagnose MRSA infection nor to guide or monitor treatment for MRSA infections. Performed at [facility], [address], [place], [place] [address]   Culture, blood (routine x 2)     Status: None   Collection Time: 10/22/20 11:40 AM   Specimen: BLOOD LEFT ARM  Result Value Ref Range Status  Specimen Description BLOOD LEFT ARM  Final   Special Requests   Final    BOTTLES DRAWN AEROBIC AND ANAEROBIC Blood Culture adequate volume   Culture   Final    NO GROWTH 5 DAYS Performed at [facility], [address] [place], [place] [address]    Report Status 10/27/2020 FINAL  Final  Culture, blood (routine x 2)     Status: None   Collection Time: 10/22/20 11:55 AM   Specimen: BLOOD LEFT HAND  Result Value Ref Range Status    Specimen Description BLOOD LEFT HAND  Final   Special Requests   Final    BOTTLES DRAWN AEROBIC AND ANAEROBIC Blood Culture adequate volume   Culture   Final    NO GROWTH 5 DAYS Performed at [facility], [address] [place], [place] [address]    Report Status 10/27/2020 FINAL  Final      Radiology Studies: No results found.     LOS: 8 days   Ankit Degregorio [employer] on [email or web address]  10/29/2020, 9:22 AM

## 2020-10-29 NOTE — Progress Notes (Signed)
   10/29/20 0952  Vitals  Temp (!) 97.4 F (36.3 C)  Temp Source Axillary  BP (!) 148/67  MAP (mmHg) 90  BP Location Right Arm  BP Method Automatic  Patient Position (if appropriate) Lying  Pulse Rate (!) 118  Pulse Rate Source Monitor  ECG Heart Rate (!) 123  Resp (!) 24  Level of Consciousness  Level of Consciousness Responds to Pain  MEWS COLOR  MEWS Score Color Red  Oxygen Therapy  SpO2 100 %  O2 Device Room Air  MEWS Score  MEWS Temp 0  MEWS Systolic 0  MEWS Pulse 2  MEWS RR 1  MEWS LOC 2  MEWS Score 5

## 2020-10-30 DIAGNOSIS — R627 Adult failure to thrive: Secondary | ICD-10-CM

## 2020-10-30 DIAGNOSIS — Z7189 Other specified counseling: Secondary | ICD-10-CM

## 2020-10-30 DIAGNOSIS — Z515 Encounter for palliative care: Secondary | ICD-10-CM

## 2020-10-30 LAB — CBC
HCT: 25.2 % — ABNORMAL LOW (ref 39.0–52.0)
Hemoglobin: 8.8 g/dL — ABNORMAL LOW (ref 13.0–17.0)
MCH: 26.3 pg (ref 26.0–34.0)
MCHC: 34.9 g/dL (ref 30.0–36.0)
MCV: 75.4 fL — ABNORMAL LOW (ref 80.0–100.0)
Platelets: 286 10*3/uL (ref 150–400)
RBC: 3.34 MIL/uL — ABNORMAL LOW (ref 4.22–5.81)
RDW: 19.2 % — ABNORMAL HIGH (ref 11.5–15.5)
WBC: 9.9 10*3/uL (ref 4.0–10.5)
nRBC: 0 % (ref 0.0–0.2)

## 2020-10-30 LAB — BASIC METABOLIC PANEL
Anion gap: 9 (ref 5–15)
BUN: 16 mg/dL (ref 8–23)
CO2: 24 mmol/L (ref 22–32)
Calcium: 7.6 mg/dL — ABNORMAL LOW (ref 8.9–10.3)
Chloride: 106 mmol/L (ref 98–111)
Creatinine, Ser: 0.81 mg/dL (ref 0.61–1.24)
GFR, Estimated: >60 mL/min (ref >60)
Glucose, Bld: 147 mg/dL — ABNORMAL HIGH (ref 70–99)
Potassium: 4.6 mmol/L (ref 3.5–5.1)
Sodium: 139 mmol/L (ref 135–145)

## 2020-10-30 LAB — GLUCOSE, CAPILLARY
Glucose-Capillary: 125 mg/dL — ABNORMAL HIGH (ref 70–99)
Glucose-Capillary: 149 mg/dL — ABNORMAL HIGH (ref 70–99)
Glucose-Capillary: 175 mg/dL — ABNORMAL HIGH (ref 70–99)
Glucose-Capillary: 220 mg/dL — ABNORMAL HIGH (ref 70–99)
Glucose-Capillary: 232 mg/dL — ABNORMAL HIGH (ref 70–99)
Glucose-Capillary: 240 mg/dL — ABNORMAL HIGH (ref 70–99)
Glucose-Capillary: 249 mg/dL — ABNORMAL HIGH (ref 70–99)
Glucose-Capillary: 88 mg/dL (ref 70–99)

## 2020-10-30 MED ORDER — FREE WATER
300.0000 mL | Status: DC
  Administered 2020-10-30 – 2020-11-06 (×42): 300 mL

## 2020-10-30 MED ORDER — INSULIN GLARGINE 100 UNIT/ML ~~LOC~~ SOLN
22.0000 [IU] | Freq: Two times a day (BID) | SUBCUTANEOUS | Status: DC
  Administered 2020-10-30 – 2020-11-05 (×14): 22 [IU] via SUBCUTANEOUS
  Filled 2020-10-30 (×17): qty 0.22

## 2020-10-30 MED ORDER — DEXTROSE 50 % IV SOLN
12.5000 g | INTRAVENOUS | Status: AC
  Administered 2020-10-30: 12.5 g via INTRAVENOUS
  Filled 2020-10-30: qty 50

## 2020-10-30 NOTE — Progress Notes (Signed)
[facility] PROGRESS NOTE   Denton Lank. RSW:[id number] DOB: 08-04-1941 DOA: 10/28/2020  PCP: [name], MD  Brief History/Interval Summary: 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 Luiz Blare 07/2020. He was sent to the ED from his nursing home for worsening altered mental status, agitation, and findings of AKI with electrolyte abnormalities.  CT A/P revealed prominent fluid collection of the left hip, lateral to arthroplasty hardware. [place] was performed of the area, revealed complex fluid collection.  Reason for Visit: Sepsis secondary to MSSA  Consultants:  Orthopedics.   Interventional radiology.   Critical care medicine. Palliative care  Procedures: 12/4: Left hip fluid collection drainage by IR  Antibiotics: Anti-infectives (From admission, onward)   Start     Dose/Rate Route Frequency Ordered Stop   10/26/20 1100  rifampin (RIFADIN) 300 mg in sodium chloride 0.9 % 100 mL IVPB        300 mg 200 mL/hr over 30 Minutes Intravenous Every 12 hours 10/26/20 1007     10/24/20 0815  ceFAZolin (ANCEF) IVPB 2g/100 mL premix        2 g 200 mL/hr over 30 Minutes Intravenous Every 8 hours 10/24/20 0804     10/23/20 2200  ceFAZolin (ANCEF) IVPB 2g/100 mL premix  Status:  Discontinued        2 g 200 mL/hr over 30 Minutes Intravenous Every 12 hours 10/23/20 1406 10/24/20 0804   10/22/20 1000  ceFAZolin (ANCEF) IVPB 1 g/50 mL premix  Status:  Discontinued        1 g 100 mL/hr over 30 Minutes Intravenous Every 12 hours 11/04/2020 1745 10/23/20 1406   11/13/2020 2200  ceFAZolin (ANCEF) IVPB 2g/100 mL premix        2 g 200 mL/hr over 30 Minutes Intravenous  Once 11/17/2020 1739 11/11/2020 2239   11/12/2020 2100  cefTRIAXone (ROCEPHIN) 2 g in  sodium chloride 0.9 % 100 mL IVPB  Status:  Discontinued        2 g 200 mL/hr over 30 Minutes Intravenous Every 24 hours 11/01/2020 1117 11/08/2020 1129   10/19/2020 1730  linezolid (ZYVOX) IVPB 600 mg  Status:  Discontinued        600 mg 300 mL/hr over 60 Minutes Intravenous Every 12 hours 11/08/2020 1154 10/25/2020 1739   11/12/2020 1130  cefTRIAXone (ROCEPHIN) 2 g in sodium chloride 0.9 % 100 mL IVPB  Status:  Discontinued        2 g 200 mL/hr over 30 Minutes Intravenous Every 24 hours 10/19/2020 1129 11/15/2020 1739   10/31/2020 1000  meropenem (MERREM) 500 mg in sodium chloride 0.9 % 100 mL IVPB  Status:  Discontinued        500 mg 200 mL/hr over 30 Minutes Intravenous Every 12 hours 11/13/2020 0330 11/01/2020 1117   10/30/2020 0400  linezolid (ZYVOX) IVPB 600 mg        600 mg 300 mL/hr over 60 Minutes Intravenous  Once 11/13/2020 0309 10/29/2020 0654   11/10/2020 0215  ceFEPIme (MAXIPIME) 2 g in sodium chloride 0.9 % 100 mL IVPB        2 g 200 mL/hr over 30 Minutes Intravenous  Once 11/11/2020 0212 10/20/2020 0306   10/24/2020 0215  ampicillin-sulbactam (UNASYN) 1.5 g in sodium chloride 0.9 %  100 mL IVPB  Status:  Discontinued        1.5 g 200 mL/hr over 30 Minutes Intravenous  Once 10/24/2020 0212 11/05/2020 0214   10/19/2020 0215  vancomycin (VANCOCIN) IVPB 1000 mg/200 mL premix  Status:  Discontinued        1,000 mg 200 mL/hr over 60 Minutes Intravenous  Once 11/05/2020 1610 11/04/2020 0314      Subjective/Interval History: Patient remains lethargic.  Occasionally responds.   Assessment/Plan:  MSSA bacteremia/sepsis secondary to MSSA/left hip abscess/septic shock Patient was in the intensive care unit due to septic shock.  He required pressors.  Was eventually weaned off of pressors.  Cultures are growing MSSA.   Patient currently on cefazolin.  Rifampin was added by ID on 12/9.  Plan is for 6 weeks of IV cefazolin and oral rifampin followed by protracted indefinite course of oral Keflex and oral rifampin.  Echocardiogram does not show any vegetation. Blood cultures repeated on 12/5 and without any growth so far. Patient not considered a good candidate for operative intervention of the left hip.  Interventional radiology was consulted and abscess has been drained. Continue to watch drain output. Plan is to repeat CT scan when output has decreased. Orthopedics and interventional radiology continues to follow.  SVT/sinus tachycardia EKGs were reviewed with cardiology to make sure there is no other arrhythmia.  They also feel that this is all sinus tachycardia.  Likely being driven by his acute illness. Heart rate remained elevated despite escalating doses of metoprolol.  Subsequently patient was started on Cardizem.  That seems to be helping as heart rate seems to be better..  Continue to monitor heart rate.  TSH was normal at 1.6.  Slowly taper off metoprolol. Echocardiogram was also done as discussed above.   Systolic function was noted to be normal.  No significant valvular abnormalities noted.  Acute metabolic encephalopathy in the setting of history of stroke and vascular dementia Patient's baseline mentation not entirely clear.  Discussed with son who mentions that over the past 3 months patient has had a decline in his mentation.   Recent brain MRI did not show any acute process. Acute illness likely contributing to his encephalopathy along with the baseline dementia.  Cortisol level noted to be normal. Patient also with history of stroke with left hemiparesis. Over the last 3 days we felt that patient was perhaps a bit more responsive suggesting that he might make improvement.  Patient has plateaued.  Considering his significant other comorbidities his prognosis remains poor.  Acute kidney injury/hypernatremia Likely secondary to sepsis and prerenal acidemia.  Renal function has improved.  Free water dose was increased.  Sodium level has improved.  Cut back on the dose of the free  water.  Microcytic anemia History of iron deficiency is noted.  Drop in hemoglobin dilutional.  No evidence of overt bleeding   Transaminitis Thought to be due to septic shock.  Although AST was not noted to be significantly elevated.  LFTs remain elevated but stable.  Continue to monitor periodically.  Diabetes mellitus type 2, uncontrolled with hyperglycemia/hypoglycemia HbA1c 8.1 when last checked.  No further episodes of hypoglycemia noted.  CBGs are stable.  Will not change insulin dosage at this time.  Continue Lantus and SSI as well as scheduled NovoLog.  History of dysphagia Currently on tube feedings via NG tube.  Not yet ready for swallow evaluation due to altered mental status.  However his mentation seems to be gradually improving.  Speech therapy  has been seeing him every so often.  Goals of care Have had multiple discussions with patient's son regarding patient's trajectory over the last several months.  He was told about his severity of illness.  Also told about the fact that patient has a history of vascular dementia which will not get any better.  Initially patient's son wanted to continue full scope of care.   Subsequently son came and visited the patient and he realized that the patient was sicker than he had expected.  CODE STATUS was changed to DNR.  Patient has not shown much improvement in the last several days despite maximal treatment and correction of electrolyte abnormalities and renal function.  I had broached the topic of palliative care with the son and he seemed open to it yesterday.  Tried to reach the son today without success so far.  We will consult palliative care to assist with the situation as I feel that transition to hospice/comfort care may be the best option for this patient.  Pressure injury Pressure Injury 08/07/20 Buttocks Right;Medial;Left Stage 2 -  Partial thickness loss of dermis presenting as a shallow open injury with a red, pink wound bed  without slough. (Active)  08/07/20 1914  Location: Buttocks  Location Orientation: Right;Medial;Left  Staging: Stage 2 -  Partial thickness loss of dermis presenting as a shallow open injury with a red, pink wound bed without slough.  Wound Description (Comments):   Present on Admission: Yes    Severe malnutrition Nutrition Problem: Severe Malnutrition Etiology: chronic illness (dementia)  Signs/Symptoms: severe fat depletion,severe muscle depletion,percent weight loss (15.4% weight loss in 9 months) Percent weight loss: 15.4 % (less than 9 months)  Interventions: Tube feeding,Prostat,MVI   DVT Prophylaxis: Subcutaneous heparin Code Status: DNR Family Communication: Son being updated periodically.  Last updated on 12/12.  Tried today without success so far. Disposition Plan: Palliative care to be consulted.  Disposition to be determined.  Status is: Inpatient  Remains inpatient appropriate because:Altered mental status, IV treatments appropriate due to intensity of illness or inability to take PO and Inpatient level of care appropriate due to severity of illness   Dispo:  Patient From: Skilled Nursing Facility  Planned Disposition: Skilled Nursing Facility  Expected discharge date: 10/30/2020  Medically stable for discharge: No      Medications:  Scheduled: . brimonidine  1 drop Both Eyes TID  . Chlorhexidine Gluconate Cloth  6 each Topical Daily  . diltiazem  60 mg Per Tube Q8H  . feeding supplement (PROSource TF)  45 mL Per Tube Daily  . free water  400 mL Per Tube Q4H  . heparin injection (subcutaneous)  5,000 Units Subcutaneous Q8H  . insulin aspart  0-15 Units Subcutaneous Q4H  . insulin glargine  25 Units Subcutaneous BID  . latanoprost  1 drop Both Eyes QHS  . metoprolol tartrate  50 mg Per Tube BID  . pantoprazole (PROTONIX) IV  40 mg Intravenous QHS  . sodium chloride flush  5 mL Intracatheter Q8H   Continuous: . sodium chloride Stopped (10/25/20 0603)   .  ceFAZolin (ANCEF) IV 2 g (10/30/20 0526)  . feeding supplement (OSMOLITE 1.2 CAL) 1,000 mL (10/30/20 0120)  . rifampin (RIFADIN) IVPB 300 mg (10/29/20 2205)   GNF:AOZHYQPRN:sodium chloride, acetaminophen, docusate, HYDROmorphone (DILAUDID) injection, metoprolol tartrate, oxyCODONE, pneumococcal 23 valent vaccine, polyethylene glycol   Objective:  Vital Signs  Vitals:   10/30/20 0000 10/30/20 0055 10/30/20 0107 10/30/20 0334  BP:  (!) 144/74  Marland Kitchen(!)  144/69  Pulse: (!) 106 (!) 106  (!) 117  Resp: Temp:    100.1 F (37.8 C)  TempSrc:    Axillary  SpO2: 100% 100%  100%  Weight:   74.3 kg   Height:        Intake/Output Summary (Last 24 hours) at 10/30/2020 0859 Last data filed at 10/30/2020 0841 Gross per 24 hour  Intake 65 ml  Output 6310 ml  Net -6245 ml   Filed Weights   10/26/20 0815 10/29/20 2128 10/30/20 0107  Weight: 69.5 kg 74.3 kg 74.3 kg    General appearance: Lethargic.  Arousable.  Does not really communicate. Feeding tube noted in the nostril Resp: Coarse sounds bilaterally with few crackles at the bases.  Mildly tachypneic. Cardio: S1-S2 is tachycardic regular.  No S3-S4.  No rubs murmurs or bruit GI: Abdomen is soft.  Nontender nondistended.  Bowel sounds are present normal.  No masses organomegaly Extremities: No edema.  Neurologic: Known left hemiparesis        Lab Results:  Data Reviewed: I have personally reviewed following labs and imaging studies  CBC: Recent Labs  Lab 10/24/20 0132 10/25/20 0055 10/26/20 0212 10/28/20 0048 10/30/20 0301  WBC 7.1 8.5 8.9 9.0 9.9  NEUTROABS 5.5 6.6 6.5  --   --   HGB 10.7* 11.4* 10.8* 8.8* 8.8*  HCT 30.6* 33.8* 31.1* 25.7* 25.2*  MCV 74.8* 76.3* 76.0* 75.1* 75.4*  PLT 166 167 155 200 286    Basic Metabolic Panel: Recent Labs  Lab 10/23/20 1807 10/24/20 0132 10/26/20 0212 10/27/20 0244 10/28/20 0048 10/29/20 0313 10/30/20 0301  NA  --    < > 150* 147* 141 142 139  K  --    < > 4.8 3.8  4.0 4.8 4.6  CL  --    < > 118* 116* 112* 110 106  CO2  --    < > 23 23 20* 24 24  GLUCOSE  --    < > 272* 192* 276* 132* 147*  BUN  --    < > 33* 25* CREATININE  --    < > 1.07 0.97 0.95 0.88 0.81  CALCIUM  --    < > 8.2* 7.7* 7.3* 7.7* 7.6*  MG 2.5*  --   --   --   --   --   --   PHOS 3.0  --   --   --   --   --   --    < > = values in this interval not displayed.    GFR: Estimated Creatinine Clearance: 79 mL/min (by C-G formula based on SCr of 0.81 mg/dL).  Liver Function Tests: Recent Labs  Lab 10/25/20 0543 10/26/20 0212 10/27/20 0244  AST 153* 132* 71*  ALT 45* 47* 30  ALKPHOS 188* 189* 176*  BILITOT 0.8 0.5 1.1  PROT 6.8 6.8 6.5  ALBUMIN 1.5* 1.3* 1.3*     CBG: Recent Labs  Lab 10/29/20 2316 10/30/20 0016 10/30/20 0113 10/30/20 0337 10/30/20 0727  GLUCAP 83 88 149* 125* 175*      Recent Results (from the past 240 hour(s))  Culture, blood (Routine x 2)     Status: Abnormal   Collection Time: 10/20/2020  1:00 AM   Specimen: BLOOD  Result Value Ref Range Status   Specimen Description BLOOD SITE NOT SPECIFIED  Final   Special Requests   Final    BOTTLES DRAWN AEROBIC  AND ANAEROBIC Blood Culture adequate volume   Culture  Setup Time   Final    GRAM POSITIVE COCCI IN CLUSTERS IN BOTH AEROBIC AND ANAEROBIC BOTTLES CRITICAL VALUE NOTED.  VALUE IS CONSISTENT WITH PREVIOUSLY REPORTED AND CALLED VALUE.    Culture (A)  Final    STAPHYLOCOCCUS AUREUS SUSCEPTIBILITIES PERFORMED ON PREVIOUS CULTURE WITHIN THE LAST 5 DAYS. Performed at [facility], [address], [place], [place] [address]    Report Status 10/23/2020 FINAL  Final  Culture, blood (Routine x 2)     Status: Abnormal   Collection Time: October 28, 2020  1:00 AM   Specimen: BLOOD  Result Value Ref Range Status   Specimen Description BLOOD SITE NOT SPECIFIED  Final   Special Requests   Final    BOTTLES DRAWN AEROBIC AND ANAEROBIC Blood Culture results may not be optimal due to an  inadequate volume of blood received in culture bottles   Culture  Setup Time   Final    GRAM POSITIVE COCCI IN CLUSTERS IN BOTH AEROBIC AND ANAEROBIC BOTTLES CRITICAL RESULT CALLED TO, READ BACK BY AND VERIFIED WITH: E MARTEN,PHARMD AT 1717 2020/10/28 BY L BENFIELD Performed at [facility], [address], [place], [place] [address]    Culture STAPHYLOCOCCUS AUREUS (A)  Final   Report Status 10/23/2020 FINAL  Final   Organism ID, Bacteria STAPHYLOCOCCUS AUREUS  Final      Susceptibility   Staphylococcus aureus - MIC*    CIPROFLOXACIN >=8 RESISTANT Resistant     ERYTHROMYCIN <=0.25 SENSITIVE Sensitive     GENTAMICIN <=0.5 SENSITIVE Sensitive     OXACILLIN 0.5 SENSITIVE Sensitive     TETRACYCLINE <=1 SENSITIVE Sensitive     VANCOMYCIN <=0.5 SENSITIVE Sensitive     TRIMETH/SULFA <=10 SENSITIVE Sensitive     CLINDAMYCIN <=0.25 SENSITIVE Sensitive     RIFAMPIN <=0.5 SENSITIVE Sensitive     Inducible Clindamycin NEGATIVE Sensitive     * STAPHYLOCOCCUS AUREUS  Blood Culture ID Panel (Reflexed)     Status: Abnormal   Collection Time: 10/28/20  1:00 AM  Result Value Ref Range Status   Enterococcus faecalis NOT DETECTED NOT DETECTED Final   Enterococcus Faecium NOT DETECTED NOT DETECTED Final   Listeria monocytogenes NOT DETECTED NOT DETECTED Final   Staphylococcus species DETECTED (A) NOT DETECTED Final    Comment: CRITICAL RESULT CALLED TO, READ BACK BY AND VERIFIED WITH: E MARTEN,PHARMD AT 1717 Oct 28, 2020 BY L BENFIELD    Staphylococcus aureus (BCID) DETECTED (A) NOT DETECTED Final    Comment: CRITICAL RESULT CALLED TO, READ BACK BY AND VERIFIED WITH: E MARTEN,PHARMD AT 1717 October 28, 2020 BY L BENFIELD    Staphylococcus epidermidis NOT DETECTED NOT DETECTED Final   Staphylococcus lugdunensis NOT DETECTED NOT DETECTED Final   Streptococcus species NOT DETECTED NOT DETECTED Final   Streptococcus agalactiae NOT DETECTED NOT DETECTED Final   Streptococcus pneumoniae NOT DETECTED NOT DETECTED  Final   Streptococcus pyogenes NOT DETECTED NOT DETECTED Final   A.calcoaceticus-baumannii NOT DETECTED NOT DETECTED Final   Bacteroides fragilis NOT DETECTED NOT DETECTED Final   Enterobacterales NOT DETECTED NOT DETECTED Final   Enterobacter cloacae complex NOT DETECTED NOT DETECTED Final   Escherichia coli NOT DETECTED NOT DETECTED Final   Klebsiella aerogenes NOT DETECTED NOT DETECTED Final   Klebsiella oxytoca NOT DETECTED NOT DETECTED Final   Klebsiella pneumoniae NOT DETECTED NOT DETECTED Final   Proteus species NOT DETECTED NOT DETECTED Final   Salmonella species NOT DETECTED NOT DETECTED Final  Serratia marcescens NOT DETECTED NOT DETECTED Final   Haemophilus influenzae NOT DETECTED NOT DETECTED Final   Neisseria meningitidis NOT DETECTED NOT DETECTED Final   Pseudomonas aeruginosa NOT DETECTED NOT DETECTED Final   Stenotrophomonas maltophilia NOT DETECTED NOT DETECTED Final   Candida albicans NOT DETECTED NOT DETECTED Final   Candida auris NOT DETECTED NOT DETECTED Final   Candida glabrata NOT DETECTED NOT DETECTED Final   Candida krusei NOT DETECTED NOT DETECTED Final   Candida parapsilosis NOT DETECTED NOT DETECTED Final   Candida tropicalis NOT DETECTED NOT DETECTED Final   Cryptococcus neoformans/gattii NOT DETECTED NOT DETECTED Final   Meth resistant mecA/C and MREJ NOT DETECTED NOT DETECTED Final    Comment: Performed at [facility], [address] [place], [place] [address]  Resp Panel by RT-PCR (Flu A&B, Covid) Nasopharyngeal Swab     Status: None   Collection Time: 11-06-20  2:46 AM   Specimen: Nasopharyngeal Swab; Nasopharyngeal(NP) swabs in vial transport medium  Result Value Ref Range Status   SARS Coronavirus 2 by RT PCR NEGATIVE NEGATIVE Final    Comment: (NOTE) SARS-CoV-2 target nucleic acids are NOT DETECTED.  The SARS-CoV-2 RNA is generally detectable in upper respiratory specimens during the acute phase of infection. The lowest concentration of  SARS-CoV-2 viral copies this assay can detect is 138 copies/mL. A negative result does not preclude SARS-Cov-2 infection and should not be used as the sole basis for treatment or other patient management decisions. A negative result may occur with  improper specimen collection/handling, submission of specimen other than nasopharyngeal swab, presence of viral mutation(s) within the areas targeted by this assay, and inadequate number of viral copies(<138 copies/mL). A negative result must be combined with clinical observations, patient history, and epidemiological information. The expected result is Negative.  Fact Sheet for Patients:  BloggerCourse.com  Fact Sheet for Healthcare Providers:  SeriousBroker.it  This test is no t yet approved or cleared by the [place] FDA and  has been authorized for detection and/or diagnosis of SARS-CoV-2 by FDA under an Emergency Use Authorization (EUA). This EUA will remain  in effect (meaning this test can be used) for the duration of the COVID-19 declaration under Section 564(b)(1) of the Act, 21 U.S.C.section 360bbb-3(b)(1), unless the authorization is terminated  or revoked sooner.       Influenza A by PCR NEGATIVE NEGATIVE Final   Influenza B by PCR NEGATIVE NEGATIVE Final    Comment: (NOTE) The Xpert Xpress SARS-CoV-2/FLU/RSV plus assay is intended as an aid in the diagnosis of influenza from Nasopharyngeal swab specimens and should not be used as a sole basis for treatment. Nasal washings and aspirates are unacceptable for Xpert Xpress SARS-CoV-2/FLU/RSV testing.  Fact Sheet for Patients: BloggerCourse.com  Fact Sheet for Healthcare Providers: SeriousBroker.it  This test is not yet approved or cleared by the [place] FDA and has been authorized for detection and/or diagnosis of SARS-CoV-2 by FDA under an Emergency Use  Authorization (EUA). This EUA will remain in effect (meaning this test can be used) for the duration of the COVID-19 declaration under Section 564(b)(1) of the Act, 21 U.S.C. section 360bbb-3(b)(1), unless the authorization is terminated or revoked.  Performed at [facility] Lab, [address] [place], [place] [address]   Urine culture     Status: Abnormal   Collection Time: 11-06-20  2:48 AM   Specimen: In/Out Cath Urine  Result Value Ref Range Status   Specimen Description IN/OUT CATH URINE  Final  Special Requests   Final    NONE Performed at [facility], [address], [place], [place] [address]    Culture >=100,000 COLONIES/mL STAPHYLOCOCCUS AUREUS (A)  Final   Report Status 10/23/2020 FINAL  Final   Organism ID, Bacteria STAPHYLOCOCCUS AUREUS (A)  Final      Susceptibility   Staphylococcus aureus - MIC*    CIPROFLOXACIN >=8 RESISTANT Resistant     GENTAMICIN <=0.5 SENSITIVE Sensitive     NITROFURANTOIN 32 SENSITIVE Sensitive     OXACILLIN 0.5 SENSITIVE Sensitive     TETRACYCLINE <=1 SENSITIVE Sensitive     VANCOMYCIN <=0.5 SENSITIVE Sensitive     TRIMETH/SULFA <=10 SENSITIVE Sensitive     CLINDAMYCIN <=0.25 SENSITIVE Sensitive     RIFAMPIN <=0.5 SENSITIVE Sensitive     Inducible Clindamycin NEGATIVE Sensitive     * >=100,000 COLONIES/mL STAPHYLOCOCCUS AUREUS  Aerobic/Anaerobic Culture (surgical/deep wound)     Status: None   Collection Time: Oct 26, 2020  1:47 PM   Specimen: Abscess  Result Value Ref Range Status   Specimen Description ABSCESS  Final   Special Requests LEFT THIGH/HIP ABSC  Final   Gram Stain   Final    FEW WBC PRESENT, PREDOMINANTLY PMN MODERATE GRAM POSITIVE COCCI IN PAIRS IN CLUSTERS    Culture   Final    ABUNDANT STAPHYLOCOCCUS AUREUS NO ANAEROBES ISOLATED Performed at [facility], [address] [place], [place] [address]    Report Status 10/26/2020 FINAL  Final   Organism ID, Bacteria STAPHYLOCOCCUS AUREUS  Final       Susceptibility   Staphylococcus aureus - MIC*    CIPROFLOXACIN >=8 RESISTANT Resistant     ERYTHROMYCIN <=0.25 SENSITIVE Sensitive     GENTAMICIN <=0.5 SENSITIVE Sensitive     OXACILLIN 0.5 SENSITIVE Sensitive     TETRACYCLINE <=1 SENSITIVE Sensitive     VANCOMYCIN <=0.5 SENSITIVE Sensitive     TRIMETH/SULFA <=10 SENSITIVE Sensitive     CLINDAMYCIN <=0.25 SENSITIVE Sensitive     RIFAMPIN <=0.5 SENSITIVE Sensitive     Inducible Clindamycin NEGATIVE Sensitive     * ABUNDANT STAPHYLOCOCCUS AUREUS  MRSA PCR Screening     Status: None   Collection Time: October 26, 2020  3:12 PM   Specimen: Nasopharyngeal  Result Value Ref Range Status   MRSA by PCR NEGATIVE NEGATIVE Final    Comment:        The GeneXpert MRSA Assay (FDA approved for NASAL specimens only), is one component of a comprehensive MRSA colonization surveillance program. It is not intended to diagnose MRSA infection nor to guide or monitor treatment for MRSA infections. Performed at [facility], [address] [place], [place] [address]   Culture, blood (routine x 2)     Status: None   Collection Time: 10/22/20 11:40 AM   Specimen: BLOOD LEFT ARM  Result Value Ref Range Status   Specimen Description BLOOD LEFT ARM  Final   Special Requests   Final    BOTTLES DRAWN AEROBIC AND ANAEROBIC Blood Culture adequate volume   Culture   Final    NO GROWTH 5 DAYS Performed at [facility], [address], [place], [place] [address]    Report Status 10/27/2020 FINAL  Final  Culture, blood (routine x 2)     Status: None   Collection Time: 10/22/20 11:55 AM   Specimen: BLOOD LEFT HAND  Result Value Ref Range Status   Specimen Description BLOOD LEFT HAND  Final  Special Requests   Final    BOTTLES DRAWN AEROBIC AND ANAEROBIC Blood Culture adequate volume   Culture   Final    NO GROWTH 5 DAYS Performed at [facility], [address], [place], [place] [address]    Report Status 10/27/2020 FINAL  Final       Radiology Studies: No results found.     LOS: 9 days   Amand Lemoine [employer] on [email or web address]  10/30/2020, 8:59 AM

## 2020-10-30 NOTE — Plan of Care (Signed)
°  Problem: Clinical Measurements: Goal: Ability to maintain clinical measurements within normal limits will improve Outcome: Progressing Goal: Diagnostic test results will improve Outcome: Progressing Goal: Respiratory complications will improve Outcome: Progressing Goal: Cardiovascular complication will be avoided Outcome: Progressing   Problem: Elimination: Goal: Will not experience complications related to bowel motility Outcome: Progressing Goal: Will not experience complications related to urinary retention Outcome: Progressing   Problem: Nutrition Goal: Patient maintains adequate hydration Outcome: Progressing

## 2020-10-30 NOTE — Progress Notes (Signed)
Referring Physician(s): Noralee Stain [facility])  Supervising Physician: Oley Balm  Patient Status:  Marcus Downs  Chief Complaint: None- lethargic  Subjective:  History of sepsis presumed secondary to left thigh/hip abscess s/p left thigh/hip drain in IR 10-31-2020. Patient laying in bed resting comfortably. Appears lethargic- does not open eyes to voice and does not follow simple commands. Left thigh/hip drain site c/d/i.   Allergies: Tape  Medications: Prior to Admission medications   Medication Sig Start Date End Date Taking? Authorizing Provider  acetaminophen (TYLENOL) 325 MG tablet Take 650 mg by mouth every 6 (six) hours as needed for mild pain or fever. 08/14/20  Yes [provider]  Amino Acids-Protein Hydrolys (FEEDING SUPPLEMENT, PRO-STAT 64,) LIQD Take 30 mLs by mouth in the morning and at bedtime. For wound healing 08/15/20  Yes [provider]  aspirin 325 MG tablet Take 325 mg by mouth daily.   Yes [provider]  atorvastatin (LIPITOR) 40 MG tablet Take 1 tablet (40 mg total) by mouth daily. 08/14/20  Yes Rhetta Mura, MD  bisacodyl (DULCOLAX) 10 MG suppository Place 10 mg rectally as needed for moderate constipation.   Yes [provider]  brimonidine (ALPHAGAN P) 0.1 % SOLN Place 1 drop into both eyes 3 (three) times daily.   Yes [provider]  ferrous sulfate 325 (65 FE) MG tablet Take 325 mg by mouth daily.  08/31/18  Yes [name], MD  Insulin Aspart Prot & Aspart (NOVOLOG MIX 70/30 FLEXPEN ) Inject 14-18 Units into the skin See admin instructions. Inject 14 Units after breakfast,  Inject 18 units after lunch, inject 16 uints at dinner.   Yes [provider]  latanoprost (XALATAN) 0.005 % ophthalmic solution Place 1 drop into both eyes at bedtime.   Yes [provider]  magnesium hydroxide (MILK OF MAGNESIA) 400 MG/5ML suspension Take 30 mLs by mouth daily as needed for mild  constipation.    Yes [provider]  metoprolol tartrate (LOPRESSOR) 25 MG tablet Take 1 tablet (25 mg total) by mouth 2 (two) times daily. 08/14/20  Yes Rhetta Mura, MD  MULTIPLE VITAMIN PO Take 1 tablet by mouth daily. W/ Minerals to promote wound healing   Yes [provider]  NON FORMULARY Medpass TID d/t weight loss. 09/13/20  Yes [provider]  potassium chloride (KLOR-CON) 10 MEQ tablet TAKE 3 TABLETS BY MOUTH DAILY Patient taking differently: 30 mEq daily.  09/11/20  Yes [name], MD  Sodium Phosphates (RA SALINE ENEMA RE) Place 1 enema rectally daily as needed (for constipation).   Yes [provider]  sodium chloride 0.9 % infusion Inject 1,000 mLs into the vein daily. 221ml/ 1 hour    [provider]  sulfamethoxazole-trimethoprim (BACTRIM DS) 800-160 MG tablet Take 1 tablet by mouth 2 (two) times daily. For 10 days    [provider]  sulfamethoxazole-trimethoprim (BACTRIM) 400-80 MG tablet Take 1 tablet by mouth 2 (two) times daily. For 10 days    [provider]     Vital Signs: BP (!) 151/62 (BP Location: Left Arm)   Pulse (!) 124   Temp 100.2 F (37.9 C) (Axillary)   Resp 20   Ht 6' (1.829 m)   Wt 74.3 kg   SpO2 99%   BMI 22.22 kg/m   Physical Exam Vitals and nursing note reviewed.  Constitutional:      General: He is not in acute distress.    Comments: Lethargic.  Pulmonary:     Effort: Pulmonary effort is normal. No respiratory distress.  Musculoskeletal:     Comments: Left thigh/hip drain site without erythema, drainage, or active bleeding; approximately 10 cc serosanguinous fluid in suction bulb; drain flushes/aspirates without resistance.  Skin:    General: Skin is warm and dry.  Neurological:     Comments: Lethargic- does not open eyes to voice and does not follow simple commands.     Imaging: No results found.  Labs:  CBC: Recent Labs    10/25/20 0055  10/26/20 0212 10/28/20 0048 10/30/20 0301  WBC 8.5 8.9 9.0 9.9  HGB 11.4* 10.8* 8.8* 8.8*  HCT 33.8* 31.1* 25.7* 25.2*  PLT 167 155 200 286    COAGS: Recent Labs    25-Oct-2020 0100  INR 1.4*  APTT 42*    BMP: Recent Labs    08/12/20 0540 08/13/20 0548 08/13/20 0548 08/21/20 0000 09/25/20 0000 10/25/20 0100 10/27/20 0244 10/28/20 0048 10/29/20 0313 10/30/20 0301  NA 146* 142   < > 138 142   < > 147* 141 142 139  K 3.8 3.9  --  4.7 4.3   < > 3.8 4.0 4.8 4.6  CL 108 110  --  102 100   < > 116* 112* 110 106  CO2 26 22  --  25* 23*   < > 23 20* 24 24  GLUCOSE 221* 161*  --   --   --    < > 192* 276* 132* 147*  BUN 25* 24*   < > 10 9   < > 25* 22 19 16   CALCIUM 7.9* 7.6*  --  8.5* 9.7   < > 7.7* 7.3* 7.7* 7.6*  CREATININE 1.34* 1.04   < > 0.8 1.1   < > 0.97 0.95 0.88 0.81  GFRNONAA 50* >60  --  83.93 62.96   < > >60 >60 >60 >60  GFRAA 58* >60  --  >90 72.97  --   --   --   --   --    < > = values in this interval not displayed.    LIVER FUNCTION TESTS: Recent Labs    10/22/20 0512 10/25/20 0543 10/26/20 0212 10/27/20 0244  BILITOT 0.5 0.8 0.5 1.1  AST 70* 153* 132* 71*  ALT 39 45* 47* 30  ALKPHOS 138* 188* 189* 176*  PROT 6.4* 6.8 6.8 6.5  ALBUMIN 1.2* 1.5* 1.3* 1.3*    Assessment and Plan:  History of sepsis presumed secondary to left thigh/hip abscess s/p left thigh/hip drain in IR 10/25/20. Left thigh/hip drain stable with approximately 15-20 cc bloody fluid in suction bulb (additional 40 cc output from drain in past 24 hours per chart). Continue current drain management- continue with Qshift flushes/monitor of output. Plan for repeat CT when output <10 cc/day (assess for possible removal). Further plans per [facility]- appreciate and agree with management. IR to follow.   Electronically Signed: [facility], PA-C 10/30/2020, 11:15 AM   I spent a total of 25 Minutes at the the patient's bedside AND on the patient's hospital floor or unit, greater than  50% of which was counseling/coordinating care for left thigh/hip abscess s/p left thigh/hip drain placement.

## 2020-10-30 NOTE — Plan of Care (Signed)
  Problem: Education: Goal: Knowledge of General Education information will improve Description: Including pain rating scale, medication(s)/side effects and non-pharmacologic comfort measures Outcome: Not Applicable   Problem: Health Behavior/Discharge Planning: Goal: Ability to manage health-related needs will improve Outcome: Not Applicable   Problem: Clinical Measurements: Goal: Ability to maintain clinical measurements within normal limits will improve Outcome: Not Applicable Goal: Diagnostic test results will improve Outcome: Not Applicable Goal: Respiratory complications will improve Outcome: Not Applicable Goal: Cardiovascular complication will be avoided Outcome: Not Applicable   Problem: Activity: Goal: Risk for activity intolerance will decrease Outcome: Not Applicable   Problem: Nutrition: Goal: Adequate nutrition will be maintained Outcome: Not Applicable   Problem: Coping: Goal: Level of anxiety will decrease Outcome: Not Applicable   Problem: Elimination: Goal: Will not experience complications related to bowel motility Outcome: Not Applicable Goal: Will not experience complications related to urinary retention Outcome: Not Applicable   Problem: Pain Managment: Goal: General experience of comfort will improve Outcome: Not Applicable   Problem: Safety: Goal: Ability to remain free from injury will improve Outcome: Not Applicable   Problem: Skin Integrity: Goal: Risk for impaired skin integrity will decrease Outcome: Not Applicable   Problem: Nutrition Goal: Patient maintains adequate hydration Outcome: Not Applicable Goal: Patient maintains weight Outcome: Not Applicable Goal: Patient/Family demonstrates understanding of diet Outcome: Not Applicable Goal: Patient/Family independently completes tube feeding Outcome: Not Applicable Goal: Patient will have no more than 5 lb weight change during LOS Outcome: Not Applicable Goal: Patient will  utilize adaptive techniques to administer nutrition Outcome: Not Applicable Goal: Patient will verbalize dietary restrictions Outcome: Not Applicable   Problem: Urinary Elimination: Goal: Signs and symptoms of infection will decrease Outcome: Not Applicable   Problem: Safety: Goal: Non-violent Restraint(s) Outcome: Not Applicable

## 2020-10-30 NOTE — Plan of Care (Signed)
  Problem: Education: Goal: Knowledge of General Education information will improve Description: Including pain rating scale, medication(s)/side effects and non-pharmacologic comfort measures Outcome: Not Progressing   Problem: Health Behavior/Discharge Planning: Goal: Ability to manage health-related needs will improve Outcome: Not Progressing   Problem: Clinical Measurements: Goal: Ability to maintain clinical measurements within normal limits will improve Outcome: Not Progressing Goal: Diagnostic test results will improve Outcome: Not Progressing Goal: Respiratory complications will improve Outcome: Not Progressing Goal: Cardiovascular complication will be avoided Outcome: Not Progressing   Problem: Activity: Goal: Risk for activity intolerance will decrease Outcome: Not Progressing   Problem: Nutrition: Goal: Adequate nutrition will be maintained Outcome: Not Progressing   Problem: Coping: Goal: Level of anxiety will decrease Outcome: Not Progressing   Problem: Elimination: Goal: Will not experience complications related to bowel motility Outcome: Not Progressing Goal: Will not experience complications related to urinary retention Outcome: Not Progressing   Problem: Pain Managment: Goal: General experience of comfort will improve Outcome: Not Progressing   Problem: Safety: Goal: Ability to remain free from injury will improve Outcome: Not Progressing   Problem: Skin Integrity: Goal: Risk for impaired skin integrity will decrease Outcome: Not Progressing   Problem: Nutrition Goal: Patient maintains adequate hydration Outcome: Not Progressing Goal: Patient maintains weight Outcome: Not Progressing Goal: Patient/Family demonstrates understanding of diet Outcome: Not Progressing Goal: Patient/Family independently completes tube feeding Outcome: Not Progressing Goal: Patient will have no more than 5 lb weight change during LOS Outcome: Not Progressing Goal:  Patient will utilize adaptive techniques to administer nutrition Outcome: Not Progressing Goal: Patient will verbalize dietary restrictions Outcome: Not Progressing   Problem: Urinary Elimination: Goal: Signs and symptoms of infection will decrease Outcome: Not Progressing   Problem: Safety: Goal: Non-violent Restraint(s) Outcome: Not Progressing

## 2020-10-30 NOTE — Progress Notes (Signed)
Nutrition Follow Up  DOCUMENTATION CODES:   Severe malnutrition in context of chronic illness  INTERVENTION:   Continue tube feeds via Cortrak: - Osmolite 1.2 @ 65 ml/hr (1560 ml/day) - ProSource 45 ml daily - Free water 300 ml Q4 hours   Tube feeding regimen provides 1912 kcal, 98 grams of protein, and 1279 ml of H2O.  Total free water with flushes: 3079 ml  NUTRITION DIAGNOSIS:   Severe Malnutrition related to chronic illness (dementia) as evidenced by severe fat depletion,severe muscle depletion,percent weight loss (15.4% weight loss in 9 months).  Ongoing  GOAL:   Patient will meet greater than or equal to 90% of their needs   Addressed via TF  MONITOR:   Diet advancement,Labs,Weight trends,TF tolerance,Skin,I & O's  REASON FOR ASSESSMENT:   Consult Enteral/tube feeding initiation and management  ASSESSMENT:   79 year old male who presented to the ED on 12/04 as a code sepsis. PMH of HTN, vascular dementia, T2DM, GERD, dysphagia, chronic urinary retention with indwelling foley catheter, HLD, previous CVA with left hemiparesis, left femoral neck fracture s/p left hemiarthroplasty in September 2021. Pt admitted with severe sepsis secondary to UTI, MSSA bacteremia, possible left hip abscess.   12/04 - s/p CT guided abscess drain placement by IR, NG tube placed 12/06 - NG tube replaced with Cortrak, tip gastric  Plan for repeat CT of L hip/thigh once drain output <10 ml/day (for possible removal).   Per RN pt tolerating tube feeding at goal rate. Having BMs. SLP saw pt 12/10, swallowing function remains impaired and unsafe for oral intake. Palliative spoke with family regarding GOC. Plan to transition to comfort if no improvement in next 24-48 hours. Continue tube feeding at this time. Follow for ongoing GOC discussions.   Admit weight: 70 kg Current weight: 74.3 kg  L hip/thigh drain: 40 ml x 24 hrs   UOP: 5250 ml x 24 hrs   Medications: SS novolog,  lantus Labs: CBG 83-220  Diet Order:   Diet Order            Diet NPO time specified  Diet effective now                 EDUCATION NEEDS:   Not appropriate for education at this time  Skin:  Skin Assessment: Skin Integrity Issues: Stage II: right buttocks  Last BM:  12/12  Height:   Ht Readings from Last 1 Encounters:  10/24/2020 6' (1.829 m)    Weight:   Wt Readings from Last 1 Encounters:  10/30/20 74.3 kg    BMI:  Body mass index is 22.22 kg/m.  Estimated Nutritional Needs:   Kcal:  1900-2100  Protein:  95-115 grams  Fluid:  >/= 2.0 L   Vanessa Kick RD, LDN Clinical Nutrition Pager listed in AMION

## 2020-10-30 NOTE — Consult Note (Signed)
Consultation Note Date: 10/30/2020   Patient Name: Marcus Downs.  DOB: 1941-08-08  MRN: [id number]  Age / Sex: 79 y.o., male  PCP: Ria Bush, MD Referring Physician: Bonnielee Haff, MD  Reason for Consultation: Establishing goals of care, Hospice Evaluation  HPI/Patient Profile: 79 y.o. male  with past medical history of stroke, vascular dementia, hypertension, hyperlipidemia, diabetes, GERD, dysphagia, chronic urinary retention with indwelling catheter due to BPH, L hemiarthroplasty 07/2020 admitted on 11/15/2020 from SNF with UTI and sepsis. CT A/P found left hip abscess with MSSA bacteremia. Has Cortrak in place with ongoing decreased mental status. Overall prognosis poor. Palliative care requested to continue goals of care conversations with family.   Clinical Assessment and Goals of Care: I met today with Marcus Downs. He opens his eyes just a little but unable to open them completely and he does mumble a little when asked a questions but unable to make out response. He does not always respond with a verbal response. He remains extremely lethargic and unable to follow any commands.   I called and spoke with son, Jeneen Rinks, regarding Marcus Downs status and goals. Jeneen Rinks shares with me that he is aware of the severity of his father's illness. He reports that his father was wheelchair bound and with cognitive decline due to dementia prior to hospitalization. We discussed that his acute infection and hospitalization has further impacted his dementia and I worry about what this may look like for him moving forward. I expressed concern for Marcus Downs quality of life and the benefit of ongoing aggressive care with antibiotic treatment and tube feedings if he is not showing further improvement. We discussed aggressive vs comfort path and Jeneen Rinks does share that he does not feel his father would want his life prolonged in  her current situation and quality. However, Jeneen Rinks also feels that he has been told that there have been some improvement and does not want to make a decision for comfort care too hastily. I express understanding. We discussed a plan to continue to monitor over the next 1-2 days if not showing improvement in alertness and ability to at least interact with SLP then we would consider transition to comfort care. We did discuss hospice options with hospice at SNF vs hospice facility. We will discuss more tomorrow.   All questions/concerns addressed. Emotional support provided.   Primary Decision Maker NEXT OF KIN son Jeneen Rinks    SUMMARY OF RECOMMENDATIONS   - DNR - Consider comfort care if no improvement over next ~1-2 days  Code Status/Advance Care Planning:  DNR   Symptom Management:   Per attending.   Palliative Prophylaxis:   Aspiration, Bowel Regimen, Delirium Protocol, Eye Care, Frequent Pain Assessment, Oral Care and Turn Reposition  Prognosis:   Overall prognosis poor with failure to thrive in setting of functional and cognitive decline chronically and worsened with acute infection and hospitalization. Would be eligible for hospice facility if transitioned to comfort care.   Discharge Planning: To Be Determined  Primary Diagnoses: Present on Admission: . Severe sepsis ([place]) . Protein-calorie malnutrition, severe ([place])   I have reviewed the medical record, interviewed the patient and family, and examined the patient. The following aspects are pertinent.  Past Medical History:  Diagnosis Date  . CRI (chronic renal insufficiency)   . 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 Diabetes mellitus type II 1992  . HLD (hyperlipidemia) 10/1998  . HTN (hypertension) 10/1998  . Primary open angle glaucoma    [name]  . RBBB   . Stroke [facility])    Left sided weakness   Social History   Socioeconomic History  . Marital status: Divorced     Spouse name: Not on file  . Number of children: 2  . Years of education: Not on file  . Highest education level: Not on file  Occupational History  . Occupation: Retired medically from [employer]  Tobacco Use  . Smoking status: Former Smoker    Packs/day: 1.00    Years: 30.00    Pack years: 30.00    Types: Cigarettes    Quit date: 09/07/1996    Years since quitting: 24.1  . Smokeless tobacco: Never Used  . Tobacco comment: quit after CVA  Vaping Use  . Vaping Use: Never used  Substance and Sexual Activity  . Alcohol use: No  . Drug use: No  . Sexual activity: Not Currently  Other Topics Concern  . Not on file  Social History Narrative   Caffeine: neg   Lives alone. Friend comes and helps sometimes. Son's brother in law helps with medicines. Family nearby - 2 sisters and nieces/nephews in area.   Sons live on other side of [place]       Ophtho - Dr. Venetia Maxon at [facility] [employer]   Social Determinants of Health   Financial Resource Strain: Not on file  Food Insecurity: Not on file  Transportation Needs: Not on file  Physical Activity: Not on file  Stress: Not on file  Social Connections: Not on file   Family History  Problem Relation Age of Onset  . Heart attack Father 61  . Hypertension Sister        Multiple medical problems  . Diabetes Sister   . Stroke Neg Hx   . Cancer Neg Hx    Scheduled Meds: . brimonidine  1 drop Both Eyes TID  . Chlorhexidine Gluconate Cloth  6 each Topical Daily  . diltiazem  60 mg Per Tube Q8H  . feeding supplement (PROSource TF)  45 mL Per Tube Daily  . free water  300 mL Per Tube Q4H  . heparin injection (subcutaneous)  5,000 Units Subcutaneous Q8H  . insulin aspart  0-15 Units Subcutaneous Q4H  . insulin glargine  22 Units Subcutaneous BID  . latanoprost  1 drop Both Eyes QHS  . metoprolol tartrate  50 mg Per Tube BID  . pantoprazole (PROTONIX) IV  40 mg Intravenous QHS  . sodium chloride flush  5 mL  Intracatheter Q8H   Continuous Infusions: . sodium chloride Stopped (10/25/20 0603)  .  ceFAZolin (ANCEF) IV 2 g (10/30/20 0526)  . feeding supplement (OSMOLITE 1.2 CAL) 1,000 mL (10/30/20 0120)  . rifampin (RIFADIN) IVPB 300 mg (10/29/20 2205)   PRN Meds:.sodium chloride, acetaminophen, docusate, HYDROmorphone (DILAUDID) injection, metoprolol tartrate, oxyCODONE, pneumococcal 23 valent vaccine, polyethylene glycol Allergies  Allergen Reactions  . Tape Rash    B[place]   Review of Systems  Unable to perform ROS: Acuity of condition    Physical Exam Vitals and nursing note reviewed.  Constitutional:      General: He is not in acute distress.    Appearance: He is ill-appearing.  Cardiovascular:     Rate and Rhythm: Tachycardia present.  Pulmonary:     Effort: Pulmonary effort is normal. No tachypnea, accessory muscle usage or respiratory distress.  Abdominal:     General: Abdomen is flat.     Palpations: Abdomen is soft.  Neurological:     Mental Status: He is lethargic and confused.     Vital Signs: BP (!) 151/62 (BP Location: Left Arm)   Pulse (!) 124   Temp 100.1 F (37.8 C) (Axillary)   Resp 20   Ht 6' (1.829 m)   Wt 74.3 kg   SpO2 99%   BMI 22.22 kg/m  Pain Scale: 0-10 POSS *See Group Information*: 1-Acceptable,Awake and alert Pain Score: 0-No pain   SpO2: SpO2: 99 % O2 Device:SpO2: 99 % O2 Flow Rate: .   IO: Intake/output summary:   Intake/Output Summary (Last 24 hours) at 10/30/2020 0946 Last data filed at 10/30/2020 0841 Gross per 24 hour  Intake 65 ml  Output 6310 ml  Net -6245 ml    LBM: Last BM Date: 10/29/20 Baseline Weight: Weight: 70 kg Most recent weight: Weight: 74.3 kg     Palliative Assessment/Data:     Time In: 1155 Time Out: 1245 Time Total: 50 min Greater than 50%  of this time was spent counseling and coordinating care related to the above assessment and plan.  Signed by: Vinie Sill, NP Palliative Medicine Team Pager  # [phone] (M-F 8a-5p) Team Phone # [phone] (Nights/Weekends)

## 2020-10-31 LAB — BASIC METABOLIC PANEL
Anion gap: 9 (ref 5–15)
BUN: 18 mg/dL (ref 8–23)
CO2: 23 mmol/L (ref 22–32)
Calcium: 7.4 mg/dL — ABNORMAL LOW (ref 8.9–10.3)
Chloride: 104 mmol/L (ref 98–111)
Creatinine, Ser: 0.96 mg/dL (ref 0.61–1.24)
GFR, Estimated: >60 mL/min (ref >60)
Glucose, Bld: 238 mg/dL — ABNORMAL HIGH (ref 70–99)
Potassium: 4.4 mmol/L (ref 3.5–5.1)
Sodium: 136 mmol/L (ref 135–145)

## 2020-10-31 LAB — GLUCOSE, CAPILLARY
Glucose-Capillary: 238 mg/dL — ABNORMAL HIGH (ref 70–99)
Glucose-Capillary: 218 mg/dL — ABNORMAL HIGH (ref 70–99)
Glucose-Capillary: 136 mg/dL — ABNORMAL HIGH (ref 70–99)
Glucose-Capillary: 263 mg/dL — ABNORMAL HIGH (ref 70–99)
Glucose-Capillary: 230 mg/dL — ABNORMAL HIGH (ref 70–99)
Glucose-Capillary: 210 mg/dL — ABNORMAL HIGH (ref 70–99)
Glucose-Capillary: 206 mg/dL — ABNORMAL HIGH (ref 70–99)

## 2020-10-31 NOTE — Care Management Important Message (Signed)
Important Message  Patient Details  Name: Marcus Downs. MRN: [id number] Date of Birth: 10/14/1941   Medicare Important Message Given:  Yes     Oralia Rud Devri Kreher 10/31/2020, 12:34 PM

## 2020-10-31 NOTE — Progress Notes (Addendum)
Referring Physician(s): Noralee Stain  Supervising Physician: Marliss Coots  Patient Status:  [facility] - In-pt  Chief Complaint: Left thigh abscess s/p abscess drain placement on 12.4.21 by Dr. Lowella Dandy  Subjective:  None. Patient not verbalizing but will moan with physical touch.   Allergies: Tape  Medications: Prior to Admission medications   Medication Sig Start Date End Date Taking? Authorizing Provider  acetaminophen (TYLENOL) 325 MG tablet Take 650 mg by mouth every 6 (six) hours as needed for mild pain or fever. 08/14/20  Yes [provider]  Amino Acids-Protein Hydrolys (FEEDING SUPPLEMENT, PRO-STAT 64,) LIQD Take 30 mLs by mouth in the morning and at bedtime. For wound healing 08/15/20  Yes [provider]  aspirin 325 MG tablet Take 325 mg by mouth daily.   Yes [provider]  atorvastatin (LIPITOR) 40 MG tablet Take 1 tablet (40 mg total) by mouth daily. 08/14/20  Yes Rhetta Mura, MD  bisacodyl (DULCOLAX) 10 MG suppository Place 10 mg rectally as needed for moderate constipation.   Yes [provider]  brimonidine (ALPHAGAN P) 0.1 % SOLN Place 1 drop into both eyes 3 (three) times daily.   Yes [provider]  ferrous sulfate 325 (65 FE) MG tablet Take 325 mg by mouth daily.  08/31/18  Yes [name], MD  Insulin Aspart Prot & Aspart (NOVOLOG MIX 70/30 FLEXPEN [place]) Inject 14-18 Units into the skin See admin instructions. Inject 14 Units after breakfast,  Inject 18 units after lunch, inject 16 uints at dinner.   Yes [provider]  latanoprost (XALATAN) 0.005 % ophthalmic solution Place 1 drop into both eyes at bedtime.   Yes [provider]  magnesium hydroxide (MILK OF MAGNESIA) 400 MG/5ML suspension Take 30 mLs by mouth daily as needed for mild constipation.    Yes [provider]  metoprolol tartrate (LOPRESSOR) 25 MG tablet Take 1 tablet (25 mg total) by mouth 2 (two) times daily. 08/14/20   Yes Rhetta Mura, MD  MULTIPLE VITAMIN PO Take 1 tablet by mouth daily. W/ Minerals to promote wound healing   Yes [provider]  NON FORMULARY Medpass TID d/t weight loss. 09/13/20  Yes [provider]  potassium chloride (KLOR-CON) 10 MEQ tablet TAKE 3 TABLETS BY MOUTH DAILY Patient taking differently: 30 mEq daily.  09/11/20  Yes [name], MD  Sodium Phosphates (RA SALINE ENEMA RE) Place 1 enema rectally daily as needed (for constipation).   Yes [provider]  sodium chloride 0.9 % infusion Inject 1,000 mLs into the vein daily. 2101ml/ 1 hour    [provider]  sulfamethoxazole-trimethoprim (BACTRIM DS) 800-160 MG tablet Take 1 tablet by mouth 2 (two) times daily. For 10 days    [provider]  sulfamethoxazole-trimethoprim (BACTRIM) 400-80 MG tablet Take 1 tablet by mouth 2 (two) times daily. For 10 days    [provider]     Vital Signs: BP (!) 130/54   Pulse (!) 108   Temp (!) 97.1 F (36.2 C) (Axillary)   Resp (!) 22   Ht 6' (1.829 m)   Wt 165 lb 12.6 oz (75.2 kg)   SpO2 100%   BMI 22.48 kg/m   Physical Exam Vitals and nursing note reviewed.  Constitutional:      Appearance: He is well-developed and well-nourished.     Comments: Patient moans with physical stimuli.  HENT:     Head: Normocephalic.  Pulmonary:     Effort: Pulmonary effort  is normal.  Abdominal:     Comments: Positive left thigh  drain to suction. Site is unremarkable with no erythema, edema, tenderness, bleeding or drainage noted at exit site. Suture and stat lock in place. Dressing is clean dry and intact. 25 ml of  serosanguinous  colored fluid noted in bulb suction device. Drain is able to be flushed easily.    Musculoskeletal:        General: Normal range of motion.     Cervical back: Normal range of motion.  Skin:    General: Skin is dry.  Neurological:     Mental Status: He is alert.  Psychiatric:        Mood and  Affect: Mood and affect normal.     Imaging: No results found.  Labs:  CBC: Recent Labs    10/25/20 0055 10/26/20 0212 10/28/20 0048 10/30/20 0301  WBC 8.5 8.9 9.0 9.9  HGB 11.4* 10.8* 8.8* 8.8*  HCT 33.8* 31.1* 25.7* 25.2*  PLT 167 155 200 286    COAGS: Recent Labs    10/23/20 0100  INR 1.4*  APTT 42*    BMP: Recent Labs    08/12/20 0540 08/13/20 0548 08/13/20 0548 08/21/20 0000 09/25/20 0000 October 23, 2020 0100 10/28/20 0048 10/29/20 0313 10/30/20 0301 10/31/20 0333  NA 146* 142   < > 138 142   < > 141 142 139 136  K 3.8 3.9  --  4.7 4.3   < > 4.0 4.8 4.6 4.4  CL 108 110  --  102 100   < > 112* 110 106 104  CO2 26 22  --  25* 23*   < > 20* 24 24 23   GLUCOSE 221* 161*  --   --   --    < > 276* 132* 147* 238*  BUN 25* 24*   < > 10 9   < > 22 19 16 18   CALCIUM 7.9* 7.6*  --  8.5* 9.7   < > 7.3* 7.7* 7.6* 7.4*  CREATININE 1.34* 1.04   < > 0.8 1.1   < > 0.95 0.88 0.81 0.96  GFRNONAA 50* >60  --  83.93 62.96   < > >60 >60 >60 >60  GFRAA 58* >60  --  >90 72.97  --   --   --   --   --    < > = values in this interval not displayed.    LIVER FUNCTION TESTS: Recent Labs    10/22/20 0512 10/25/20 0543 10/26/20 0212 10/27/20 0244  BILITOT 0.5 0.8 0.5 1.1  AST 70* 153* 132* 71*  ALT 39 45* 47* 30  ALKPHOS 138* 188* 189* 176*  PROT 6.4* 6.8 6.8 6.5  ALBUMIN 1.2* 1.5* 1.3* 1.3*    Assessment and Plan:  79 y.o. male inpatient. History of CVA, dementia, HTN, HLD, DM, GERD, BPH with chronic indwelling foley. Patient was admitted on 10-23-2020 for UTI and sepsis found to have a left hip abscess with cultures showing MSSA bacteremia. IR placed an abscess drain in the patient's left thigh on Oct 23, 2020. Per Epic output is 95 ml, 60 ml, 60 ml. WBC is WNL, patient is afebrile. Patient is being seen by Palliative care.   Recommend team continue with flushing TID, output recording q shift and dressing changes as needed. Would consider additional imaging when output is less  than 10 ml for 24 hours not including flush material.   Continue current treatment plans as per Primary team.  Electronically Signed: Alene Mires, NP 10/31/2020, 9:44 AM   I spent a total of 15 Minutes at the patient's bedside AND on the patient's hospital floor or unit, greater than 50% of which was counseling/coordinating care for abscess drain.

## 2020-10-31 NOTE — Progress Notes (Signed)
Palliative:  HPI: 79 y.o. male  with past medical history of stroke, vascular dementia, hypertension, hyperlipidemia, diabetes, GERD, dysphagia, chronic urinary retention with indwelling catheter due to BPH, L hemiarthroplasty 07/2020 admitted on 11/01/2020 from SNF with UTI and sepsis. CT A/P found left hip abscess with MSSA bacteremia. Has Cortrak in place with ongoing decreased mental status. Overall prognosis poor. Palliative care requested to continue goals of care conversations with family.   I met this morning with Marcus Downs. No family at bedside. He initially opens his eyes and looks at me but unable to keep them open for long. He does attempt to open mouth when I ask but after multiple times asking and with much difficulty. After this he closes his eyes and goes back to sleep. He feels very warm and fevers noted overnight. No family at bedside. Discussed concerns and decline with Dr. [place] Pink.   I called and spoke to son, Marcus Downs. I explained my concern with fevers, ongoing lethargy, and lack of improvement. Marcus Downs was confused and shares that he was told that his father was improving by a visitor yesterday. We discussed the differing views of improvement versus the significant improvement his father would have to make to thrive and continue to prolong his life. He is not alert enough for intake. I worry that he could be aspirating. Marcus Downs does share that he does not feel that his father would want to be kept alive with feeding tube. However, Marcus Downs is also struggling with decisions to move forward. Unfortunately, Marcus Downs is not showing much improvement and path moving forward is unclear. Marcus Downs does seem to be conflicted by receiving differing reports on his father's status. I discussed with Marcus Downs that I feel it would be helpful if he came to bedside to visit and see his father again himself. I also suggested that we could meet in person (along with anyone else that needs to be involved in decision making)  at this time. Marcus Downs agrees and will discuss with his brother and wife and let me know when they would like to meet.   I received message from [place] regarding meeting but was unable to get him when I returned his call. Will reach out again tomorrow.   Exam: Lethargic. Does awaken but for very brief period. Skin very warm to touch. Breathing more labored today but otherwise resting comfortably.   Plan: - Ongoing goals of care discussions.   [place], NP Palliative Medicine Team Pager [phone] (Please see [email or web address] for schedule) Team Phone [phone]    Greater than 50%  of this time was spent counseling and coordinating care related to the above assessment and plan

## 2020-10-31 NOTE — Progress Notes (Signed)
[facility] PROGRESS NOTE   Marcus Downs. RSW:[id number] DOB: 08-04-1941 DOA: 10/28/2020  PCP: [name], MD  Brief History/Interval Summary: 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 Luiz Blare 07/2020. He was sent to the ED from his nursing home for worsening altered mental status, agitation, and findings of AKI with electrolyte abnormalities.  CT A/P revealed prominent fluid collection of the left hip, lateral to arthroplasty hardware. [place] was performed of the area, revealed complex fluid collection.  Reason for Visit: Sepsis secondary to MSSA  Consultants:  Orthopedics.   Interventional radiology.   Critical care medicine. Palliative care  Procedures: 12/4: Left hip fluid collection drainage by IR  Antibiotics: Anti-infectives (From admission, onward)   Start     Dose/Rate Route Frequency Ordered Stop   10/26/20 1100  rifampin (RIFADIN) 300 mg in sodium chloride 0.9 % 100 mL IVPB        300 mg 200 mL/hr over 30 Minutes Intravenous Every 12 hours 10/26/20 1007     10/24/20 0815  ceFAZolin (ANCEF) IVPB 2g/100 mL premix        2 g 200 mL/hr over 30 Minutes Intravenous Every 8 hours 10/24/20 0804     10/23/20 2200  ceFAZolin (ANCEF) IVPB 2g/100 mL premix  Status:  Discontinued        2 g 200 mL/hr over 30 Minutes Intravenous Every 12 hours 10/23/20 1406 10/24/20 0804   10/22/20 1000  ceFAZolin (ANCEF) IVPB 1 g/50 mL premix  Status:  Discontinued        1 g 100 mL/hr over 30 Minutes Intravenous Every 12 hours 11/04/2020 1745 10/23/20 1406   11/13/2020 2200  ceFAZolin (ANCEF) IVPB 2g/100 mL premix        2 g 200 mL/hr over 30 Minutes Intravenous  Once 11/17/2020 1739 11/11/2020 2239   11/12/2020 2100  cefTRIAXone (ROCEPHIN) 2 g in  sodium chloride 0.9 % 100 mL IVPB  Status:  Discontinued        2 g 200 mL/hr over 30 Minutes Intravenous Every 24 hours 11/01/2020 1117 11/08/2020 1129   10/19/2020 1730  linezolid (ZYVOX) IVPB 600 mg  Status:  Discontinued        600 mg 300 mL/hr over 60 Minutes Intravenous Every 12 hours 11/08/2020 1154 10/25/2020 1739   11/12/2020 1130  cefTRIAXone (ROCEPHIN) 2 g in sodium chloride 0.9 % 100 mL IVPB  Status:  Discontinued        2 g 200 mL/hr over 30 Minutes Intravenous Every 24 hours 10/19/2020 1129 11/15/2020 1739   10/31/2020 1000  meropenem (MERREM) 500 mg in sodium chloride 0.9 % 100 mL IVPB  Status:  Discontinued        500 mg 200 mL/hr over 30 Minutes Intravenous Every 12 hours 11/13/2020 0330 11/01/2020 1117   10/30/2020 0400  linezolid (ZYVOX) IVPB 600 mg        600 mg 300 mL/hr over 60 Minutes Intravenous  Once 11/13/2020 0309 10/29/2020 0654   11/10/2020 0215  ceFEPIme (MAXIPIME) 2 g in sodium chloride 0.9 % 100 mL IVPB        2 g 200 mL/hr over 30 Minutes Intravenous  Once 11/11/2020 0212 10/20/2020 0306   10/24/2020 0215  ampicillin-sulbactam (UNASYN) 1.5 g in sodium chloride 0.9 %  100 mL IVPB  Status:  Discontinued        1.5 g 200 mL/hr over 30 Minutes Intravenous  Once 11/02/2020 0212 10/30/2020 0214   11/07/2020 0215  vancomycin (VANCOCIN) IVPB 1000 mg/200 mL premix  Status:  Discontinued        1,000 mg 200 mL/hr over 60 Minutes Intravenous  Once 10/22/2020 6045 11/13/2020 0314      Subjective/Interval History: Patient noted to be more lethargic this morning compared to yesterday.  Not very communicative.   Assessment/Plan:  MSSA bacteremia/sepsis secondary to MSSA/left hip abscess/septic shock Patient was in the intensive care unit due to septic shock.  He required pressors.  Was eventually weaned off of pressors.  Cultures are growing MSSA.   Patient currently on cefazolin.  Rifampin was added by ID on 12/9.  Plan is for 6 weeks of IV cefazolin and oral rifampin followed by protracted indefinite course  of oral Keflex and oral rifampin. Echocardiogram does not show any vegetation. Blood cultures repeated on 12/5 and without any growth so far. Patient not considered a good candidate for operative intervention of the left hip.  Interventional radiology was consulted and abscess has been drained. Continue to watch drain output. Plan is to repeat CT scan when output has decreased. Orthopedics and interventional radiology have been following  SVT/sinus tachycardia EKGs were reviewed with cardiology to make sure there is no other arrhythmia.  They also feel that this is all sinus tachycardia.  Likely being driven by his acute illness. Heart rate remained elevated despite escalating doses of metoprolol.  Subsequently patient was started on Cardizem.  Rate seems to be better controlled but still remains elevated.   TSH was normal at 1.6.  Slowly taper off metoprolol. Echocardiogram was also done as discussed above.   Systolic function was noted to be normal.  No significant valvular abnormalities noted.  Acute metabolic encephalopathy in the setting of history of stroke and vascular dementia Patient's baseline mentation not entirely clear.  Discussed with son who mentions that over the past 3 months patient has had a decline in his mentation.   Recent brain MRI did not show any acute process. Acute illness likely contributing to his encephalopathy along with the baseline dementia.  Cortisol level noted to be normal. Patient also with history of stroke with left hemiparesis. Patient was thought to be responding more over the last 3 days however today he is more lethargic.  He has had more fevers in the last 24 to 48 hours.  Considering all of his other significant comorbidities his prognosis remains poor.    Acute kidney injury/hypernatremia Likely secondary to sepsis and prerenal acidemia.  Renal function has improved.   Due to hypernatremia free water dose was increased.  Sodium level has improved.   Reported dosage was decreased.    Microcytic anemia History of iron deficiency is noted.  Drop in hemoglobin dilutional.  No evidence of overt bleeding   Transaminitis Thought to be due to septic shock.  Although AST was not noted to be significantly elevated.  LFTs remain elevated but stable.  Continue to monitor periodically.  Diabetes mellitus type 2, uncontrolled with hyperglycemia/hypoglycemia HbA1c 8.1 when last checked.  No further episodes of hypoglycemia noted.  CBGs are stable.  Will not change insulin dosage at this time.  Continue Lantus and SSI as well as scheduled NovoLog.  History of dysphagia Currently on tube feedings via NG tube.  Not yet ready for swallow evaluation due to altered mental  status.  However his mentation seems to be gradually improving.  Speech therapy has been seeing him every so often.  Goals of care Have had multiple discussions with patient's son regarding patient's trajectory over the last several months.  He was told about his severity of illness.  Also told about the fact that patient has a history of vascular dementia which will not get any better.  Initially patient's son wanted to continue full scope of care.   Subsequently son came and visited the patient and he realized that the patient was sicker than he had expected.  CODE STATUS was changed to DNR.   Palliative care was subsequently consulted as patient was not showing much improvement.  Patient actually looks worse today compared to yesterday.  I still feel that transitioning him to comfort care and hospice is the best course of action at this time.  Palliative care to continue conversation with son.  He is leaning towards transitioning if no improvement.   Pressure injury Pressure Injury 08/07/20 Buttocks Right;Medial;Left Stage 2 -  Partial thickness loss of dermis presenting as a shallow open injury with a red, pink wound bed without slough. (Active)  08/07/20 1914  Location: Buttocks   Location Orientation: Right;Medial;Left  Staging: Stage 2 -  Partial thickness loss of dermis presenting as a shallow open injury with a red, pink wound bed without slough.  Wound Description (Comments):   Present on Admission: Yes    Severe malnutrition Nutrition Problem: Severe Malnutrition Etiology: chronic illness (dementia)  Signs/Symptoms: severe fat depletion,severe muscle depletion,percent weight loss (15.4% weight loss in 9 months) Percent weight loss: 15.4 % (less than 9 months)  Interventions: Tube feeding,Prostat,MVI   DVT Prophylaxis: Subcutaneous heparin Code Status: DNR Family Communication: Son has been updated periodically.  Now palliative care is in conversation with him. Disposition Plan: Probably looking at transitioning to comfort care soon.  Status is: Inpatient  Remains inpatient appropriate because:Altered mental status, IV treatments appropriate due to intensity of illness or inability to take PO and Inpatient level of care appropriate due to severity of illness   Dispo:  Patient From: Skilled Nursing Facility  Planned Disposition: Skilled Nursing Facility  Expected discharge date: 11/01/2020  Medically stable for discharge: No      Medications:  Scheduled: . brimonidine  1 drop Both Eyes TID  . Chlorhexidine Gluconate Cloth  6 each Topical Daily  . diltiazem  60 mg Per Tube Q8H  . feeding supplement (PROSource TF)  45 mL Per Tube Daily  . free water  300 mL Per Tube Q4H  . heparin injection (subcutaneous)  5,000 Units Subcutaneous Q8H  . insulin aspart  0-15 Units Subcutaneous Q4H  . insulin glargine  22 Units Subcutaneous BID  . latanoprost  1 drop Both Eyes QHS  . metoprolol tartrate  50 mg Per Tube BID  . pantoprazole (PROTONIX) IV  40 mg Intravenous QHS  . sodium chloride flush  5 mL Intracatheter Q8H   Continuous: . sodium chloride Stopped (10/25/20 0603)  .  ceFAZolin (ANCEF) IV 2 g (10/31/20 0526)  . feeding supplement (OSMOLITE  1.2 CAL) 1,000 mL (10/31/20 0100)  . rifampin (RIFADIN) IVPB 300 mg (10/31/20 0941)   ERD:EYCXKG chloride, acetaminophen, docusate, HYDROmorphone (DILAUDID) injection, metoprolol tartrate, oxyCODONE, pneumococcal 23 valent vaccine, polyethylene glycol   Objective:  Vital Signs  Vitals:   10/31/20 0301 10/31/20 0500 10/31/20 0744 10/31/20 0829  BP: (!) 93/49 (!) 108/54 (!) 126/36 (!) 130/54  Pulse: (!) 107 (!) 108  Resp: (!) 22  (!) 22   Temp: 99.3 F (37.4 C)  (!) 97.1 F (36.2 C)   TempSrc: Axillary  Axillary   SpO2: 99% 100% 100%   Weight:      Height:        Intake/Output Summary (Last 24 hours) at 10/31/2020 1044 Last data filed at 10/31/2020 1000 Gross per 24 hour  Intake 10 ml  Output 2145 ml  Net -2135 ml   Filed Weights   10/30/20 0107 10/30/20 2116 10/31/20 0131  Weight: 74.3 kg 75.2 kg 75.2 kg    General appearance: More lethargic today.  Opens his eyes for brief moment.   Resp: Noted to be more tachypneic.  Coarse breath sounds bilaterally. Cardio: S1-S2 is tachycardic regular.  No S3-S4.  No rubs murmurs or bruit GI: Abdomen is soft.  Nontender nondistended.  Bowel sounds are present normal.  No masses organomegaly Extremities: No edema.   Neurologic: Known left hemiparesis      Lab Results:  Data Reviewed: I have personally reviewed following labs and imaging studies  CBC: Recent Labs  Lab 10/25/20 0055 10/26/20 0212 10/28/20 0048 10/30/20 0301  WBC 8.5 8.9 9.0 9.9  NEUTROABS 6.6 6.5  --   --   HGB 11.4* 10.8* 8.8* 8.8*  HCT 33.8* 31.1* 25.7* 25.2*  MCV 76.3* 76.0* 75.1* 75.4*  PLT 167 155 200 286    Basic Metabolic Panel: Recent Labs  Lab 10/27/20 0244 10/28/20 0048 10/29/20 0313 10/30/20 0301 10/31/20 0333  NA 147* 141 142 139 136  K 3.8 4.0 4.8 4.6 4.4  CL 116* 112* 110 106 104  CO2 23 20* 24 24 23   GLUCOSE 192* 276* 132* 147* 238*  BUN 25* 22 19 16 18   CREATININE 0.97 0.95 0.88 0.81 0.96  CALCIUM 7.7* 7.3* 7.7* 7.6*  7.4*    GFR: Estimated Creatinine Clearance: 67.5 mL/min (by C-G formula based on SCr of 0.96 mg/dL).  Liver Function Tests: Recent Labs  Lab 10/25/20 0543 10/26/20 0212 10/27/20 0244  AST 153* 132* 71*  ALT 45* 47* 30  ALKPHOS 188* 189* 176*  BILITOT 0.8 0.5 1.1  PROT 6.8 6.8 6.5  ALBUMIN 1.5* 1.3* 1.3*     CBG: Recent Labs  Lab 10/30/20 1919 10/30/20 2303 10/31/20 0302 10/31/20 0518 10/31/20 0741  GLUCAP 232* 249* 238* 218* 136*      Recent Results (from the past 240 hour(s))  Aerobic/Anaerobic Culture (surgical/deep wound)     Status: None   Collection Time: 10/31/2020  1:47 PM   Specimen: Abscess  Result Value Ref Range Status   Specimen Description ABSCESS  Final   Special Requests LEFT THIGH/HIP ABSC  Final   Gram Stain   Final    FEW WBC PRESENT, PREDOMINANTLY PMN MODERATE GRAM POSITIVE COCCI IN PAIRS IN CLUSTERS    Culture   Final    ABUNDANT STAPHYLOCOCCUS AUREUS NO ANAEROBES ISOLATED Performed at [facility], [address] [place], [place] [id number]    Report Status 10/26/2020 FINAL  Final   Organism ID, Bacteria STAPHYLOCOCCUS AUREUS  Final      Susceptibility   Staphylococcus aureus - MIC*    CIPROFLOXACIN >=8 RESISTANT Resistant     ERYTHROMYCIN <=0.25 SENSITIVE Sensitive     GENTAMICIN <=0.5 SENSITIVE Sensitive     OXACILLIN 0.5 SENSITIVE Sensitive     TETRACYCLINE <=1 SENSITIVE Sensitive     VANCOMYCIN <=0.5 SENSITIVE Sensitive     TRIMETH/SULFA <=10 SENSITIVE Sensitive  CLINDAMYCIN <=0.25 SENSITIVE Sensitive     RIFAMPIN <=0.5 SENSITIVE Sensitive     Inducible Clindamycin NEGATIVE Sensitive     * ABUNDANT STAPHYLOCOCCUS AUREUS  MRSA PCR Screening     Status: None   Collection Time: 10/18/2020  3:12 PM   Specimen: Nasopharyngeal  Result Value Ref Range Status   MRSA by PCR NEGATIVE NEGATIVE Final    Comment:        The GeneXpert MRSA Assay (FDA approved for NASAL specimens only), is one component of a comprehensive MRSA  colonization surveillance program. It is not intended to diagnose MRSA infection nor to guide or monitor treatment for MRSA infections. Performed at [facility], [address] [place], [place] [address]   Culture, blood (routine x 2)     Status: None   Collection Time: 10/22/20 11:40 AM   Specimen: BLOOD LEFT ARM  Result Value Ref Range Status   Specimen Description BLOOD LEFT ARM  Final   Special Requests   Final    BOTTLES DRAWN AEROBIC AND ANAEROBIC Blood Culture adequate volume   Culture   Final    NO GROWTH 5 DAYS Performed at [facility], [address], [place], [place] [address]    Report Status 10/27/2020 FINAL  Final  Culture, blood (routine x 2)     Status: None   Collection Time: 10/22/20 11:55 AM   Specimen: BLOOD LEFT HAND  Result Value Ref Range Status   Specimen Description BLOOD LEFT HAND  Final   Special Requests   Final    BOTTLES DRAWN AEROBIC AND ANAEROBIC Blood Culture adequate volume   Culture   Final    NO GROWTH 5 DAYS Performed at [facility], [address] [place], [place] [address]    Report Status 10/27/2020 FINAL  Final      Radiology Studies: No results found.     LOS: 10 days   Bambi Fehnel [employer] on [email or web address]  10/31/2020, 10:44 AM

## 2020-11-01 LAB — GLUCOSE, CAPILLARY
Glucose-Capillary: 238 mg/dL — ABNORMAL HIGH (ref 70–99)
Glucose-Capillary: 211 mg/dL — ABNORMAL HIGH (ref 70–99)
Glucose-Capillary: 202 mg/dL — ABNORMAL HIGH (ref 70–99)
Glucose-Capillary: 163 mg/dL — ABNORMAL HIGH (ref 70–99)
Glucose-Capillary: 154 mg/dL — ABNORMAL HIGH (ref 70–99)
Glucose-Capillary: 177 mg/dL — ABNORMAL HIGH (ref 70–99)

## 2020-11-01 LAB — BASIC METABOLIC PANEL
Anion gap: 8 (ref 5–15)
BUN: 18 mg/dL (ref 8–23)
CO2: 26 mmol/L (ref 22–32)
Calcium: 7.7 mg/dL — ABNORMAL LOW (ref 8.9–10.3)
Chloride: 101 mmol/L (ref 98–111)
Creatinine, Ser: 0.9 mg/dL (ref 0.61–1.24)
GFR, Estimated: >60 mL/min (ref >60)
Glucose, Bld: 241 mg/dL — ABNORMAL HIGH (ref 70–99)
Potassium: 4.5 mmol/L (ref 3.5–5.1)
Sodium: 135 mmol/L (ref 135–145)

## 2020-11-01 NOTE — Progress Notes (Signed)
Referring Physician(s): Choi,J  Supervising Physician: Malachy Moan  Patient Status:  [facility] - In-pt  Chief Complaint:  Left thigh abscess  Subjective: Pt remains lethargic, will moan if moved/examined   Allergies: Tape  Medications: Prior to Admission medications   Medication Sig Start Date End Date Taking? Authorizing Provider  acetaminophen (TYLENOL) 325 MG tablet Take 650 mg by mouth every 6 (six) hours as needed for mild pain or fever. 08/14/20  Yes [provider]  Amino Acids-Protein Hydrolys (FEEDING SUPPLEMENT, PRO-STAT 64,) LIQD Take 30 mLs by mouth in the morning and at bedtime. For wound healing 08/15/20  Yes [provider]  aspirin 325 MG tablet Take 325 mg by mouth daily.   Yes [provider]  atorvastatin (LIPITOR) 40 MG tablet Take 1 tablet (40 mg total) by mouth daily. 08/14/20  Yes Rhetta Mura, MD  bisacodyl (DULCOLAX) 10 MG suppository Place 10 mg rectally as needed for moderate constipation.   Yes [provider]  brimonidine (ALPHAGAN P) 0.1 % SOLN Place 1 drop into both eyes 3 (three) times daily.   Yes [provider]  ferrous sulfate 325 (65 FE) MG tablet Take 325 mg by mouth daily.  08/31/18  Yes [name], MD  Insulin Aspart Prot & Aspart (NOVOLOG MIX 70/30 FLEXPEN [place]) Inject 14-18 Units into the skin See admin instructions. Inject 14 Units after breakfast,  Inject 18 units after lunch, inject 16 uints at dinner.   Yes [provider]  latanoprost (XALATAN) 0.005 % ophthalmic solution Place 1 drop into both eyes at bedtime.   Yes [provider]  magnesium hydroxide (MILK OF MAGNESIA) 400 MG/5ML suspension Take 30 mLs by mouth daily as needed for mild constipation.    Yes [provider]  metoprolol tartrate (LOPRESSOR) 25 MG tablet Take 1 tablet (25 mg total) by mouth 2 (two) times daily. 08/14/20  Yes Rhetta Mura, MD  MULTIPLE VITAMIN PO Take 1 tablet by  mouth daily. W/ Minerals to promote wound healing   Yes [provider]  NON FORMULARY Medpass TID d/t weight loss. 09/13/20  Yes [provider]  potassium chloride (KLOR-CON) 10 MEQ tablet TAKE 3 TABLETS BY MOUTH DAILY Patient taking differently: 30 mEq daily.  09/11/20  Yes [name], MD  Sodium Phosphates (RA SALINE ENEMA RE) Place 1 enema rectally daily as needed (for constipation).   Yes [provider]  sodium chloride 0.9 % infusion Inject 1,000 mLs into the vein daily. 21ml/ 1 hour    [provider]  sulfamethoxazole-trimethoprim (BACTRIM DS) 800-160 MG tablet Take 1 tablet by mouth 2 (two) times daily. For 10 days    [provider]  sulfamethoxazole-trimethoprim (BACTRIM) 400-80 MG tablet Take 1 tablet by mouth 2 (two) times daily. For 10 days    [provider]     Vital Signs: BP (!) 114/49 (BP Location: Right Arm)   Pulse (!) 105   Temp (!) 97.2 F (36.2 C) (Oral)   Resp 20   Ht 6' (1.829 m)   Wt 165 lb 5.5 oz (75 kg)   SpO2 100%   BMI 22.42 kg/m   Physical Exam pt lethargic, in no acute distress; left thigh drain intact, insertion site ok, OP 80 cc blood-tinged fluid  Imaging: No results found.  Labs:  CBC: Recent Labs    10/25/20 0055 10/26/20 0212 10/28/20 0048 10/30/20 0301  WBC 8.5 8.9 9.0 9.9  HGB 11.4* 10.8* 8.8* 8.8*  HCT 33.8*  31.1* 25.7* 25.2*  PLT 167 155 200 286    COAGS: Recent Labs    November 18, 2020 0100  INR 1.4*  APTT 42*    BMP: Recent Labs    08/12/20 0540 08/13/20 0548 08/13/20 0548 08/21/20 0000 09/25/20 0000 11-18-2020 0100 10/29/20 0313 10/30/20 0301 10/31/20 0333 11/01/20 0601  NA 146* 142   < > 138 142   < > 142 139 136 135  K 3.8 3.9  --  4.7 4.3   < > 4.8 4.6 4.4 4.5  CL 108 110  --  102 100   < > 110 106 104 101  CO2 26 22  --  25* 23*   < > 24 24 23 26   GLUCOSE 221* 161*  --   --   --    < > 132* 147* 238* 241*  BUN 25* 24*   < > 10 9   < > 19 16  18 18   CALCIUM 7.9* 7.6*  --  8.5* 9.7   < > 7.7* 7.6* 7.4* 7.7*  CREATININE 1.34* 1.04   < > 0.8 1.1   < > 0.88 0.81 0.96 0.90  GFRNONAA 50* >60  --  83.93 62.96   < > >60 >60 >60 >60  GFRAA 58* >60  --  >90 72.97  --   --   --   --   --    < > = values in this interval not displayed.    LIVER FUNCTION TESTS: Recent Labs    10/22/20 0512 10/25/20 0543 10/26/20 0212 10/27/20 0244  BILITOT 0.5 0.8 0.5 1.1  AST 70* 153* 132* 71*  ALT 39 45* 47* 30  ALKPHOS 138* 188* 189* 176*  PROT 6.4* 6.8 6.8 6.5  ALBUMIN 1.2* 1.5* 1.3* 1.3*    Assessment and Plan: 79 y.o. male inpatient. History of CVA, dementia, HTN, HLD, DM, GERD, BPH with chronic indwelling foley. Patient was admitted on 2020/11/18 for UTI and sepsis found to have a left hip abscess with cultures showing MSSA bacteremia. IR placed an abscess drain in the patient's left thigh on Nov 18, 2020.' afebrile; creat nl; latest blood cx neg; cont current tx/drain irrigation; once drain OP < 10-15 cc/day for 2-3 consecutive days or if clinical status worsens obtain f/u CT   Electronically Signed: D. 14.4.21, PA-C 11/01/2020, 10:21 AM   I spent a total of 15 minutes at the the patient's bedside AND on the patient's hospital floor or unit, greater than 50% of which was counseling/coordinating care for left thigh abscess drain    Patient ID: Jeananne Rama., male   DOB: January 04, 1941, 79 y.o.   MRN: [id number]

## 2020-11-01 NOTE — Plan of Care (Signed)

## 2020-11-01 NOTE — Plan of Care (Signed)

## 2020-11-01 NOTE — Consult Note (Signed)
   [facility] CM Inpatient Consult   11/01/2020  General Wearing [birth year] [id number]   [facility] [[facility]]  Accountable Care Organization [ACO] Patient:  Medicare   Patient screened for high risk score for unplanned readmission risk and for length of stay [11 days] hospitalization and to check if potential [facility]  [[facility]] Care Management post hospital needs.  Review of patient's medical record reveals patient was recently at [facility] for long term care level of care. Also, reviewed palliative care consult notes today, and notes regarding patient's current disposition needs.  Plan:  Continue to follow progress and disposition, as he is currently reviewed as lethargic.  For questions contact:   Charlesetta Shanks, RN BSN CCM [facility]  [phone] business mobile phone Toll free office [phone]  Fax number: [phone] [email or web address]

## 2020-11-01 NOTE — Progress Notes (Signed)
[facility] PROGRESS NOTE   Marcus Downs. RSW:[id number] DOB: 08-04-1941 DOA: 10/28/2020  PCP: [name], MD  Brief History/Interval Summary: 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 Luiz Blare 07/2020. He was sent to the ED from his nursing home for worsening altered mental status, agitation, and findings of AKI with electrolyte abnormalities.  CT A/P revealed prominent fluid collection of the left hip, lateral to arthroplasty hardware. [place] was performed of the area, revealed complex fluid collection.  Reason for Visit: Sepsis secondary to MSSA  Consultants:  Orthopedics.   Interventional radiology.   Critical care medicine. Palliative care  Procedures: 12/4: Left hip fluid collection drainage by IR  Antibiotics: Anti-infectives (From admission, onward)   Start     Dose/Rate Route Frequency Ordered Stop   10/26/20 1100  rifampin (RIFADIN) 300 mg in sodium chloride 0.9 % 100 mL IVPB        300 mg 200 mL/hr over 30 Minutes Intravenous Every 12 hours 10/26/20 1007     10/24/20 0815  ceFAZolin (ANCEF) IVPB 2g/100 mL premix        2 g 200 mL/hr over 30 Minutes Intravenous Every 8 hours 10/24/20 0804     10/23/20 2200  ceFAZolin (ANCEF) IVPB 2g/100 mL premix  Status:  Discontinued        2 g 200 mL/hr over 30 Minutes Intravenous Every 12 hours 10/23/20 1406 10/24/20 0804   10/22/20 1000  ceFAZolin (ANCEF) IVPB 1 g/50 mL premix  Status:  Discontinued        1 g 100 mL/hr over 30 Minutes Intravenous Every 12 hours 11/04/2020 1745 10/23/20 1406   11/13/2020 2200  ceFAZolin (ANCEF) IVPB 2g/100 mL premix        2 g 200 mL/hr over 30 Minutes Intravenous  Once 11/17/2020 1739 11/11/2020 2239   11/12/2020 2100  cefTRIAXone (ROCEPHIN) 2 g in  sodium chloride 0.9 % 100 mL IVPB  Status:  Discontinued        2 g 200 mL/hr over 30 Minutes Intravenous Every 24 hours 11/01/2020 1117 11/08/2020 1129   10/19/2020 1730  linezolid (ZYVOX) IVPB 600 mg  Status:  Discontinued        600 mg 300 mL/hr over 60 Minutes Intravenous Every 12 hours 11/08/2020 1154 10/25/2020 1739   11/12/2020 1130  cefTRIAXone (ROCEPHIN) 2 g in sodium chloride 0.9 % 100 mL IVPB  Status:  Discontinued        2 g 200 mL/hr over 30 Minutes Intravenous Every 24 hours 10/19/2020 1129 11/15/2020 1739   10/31/2020 1000  meropenem (MERREM) 500 mg in sodium chloride 0.9 % 100 mL IVPB  Status:  Discontinued        500 mg 200 mL/hr over 30 Minutes Intravenous Every 12 hours 11/13/2020 0330 11/01/2020 1117   10/30/2020 0400  linezolid (ZYVOX) IVPB 600 mg        600 mg 300 mL/hr over 60 Minutes Intravenous  Once 11/13/2020 0309 10/29/2020 0654   11/10/2020 0215  ceFEPIme (MAXIPIME) 2 g in sodium chloride 0.9 % 100 mL IVPB        2 g 200 mL/hr over 30 Minutes Intravenous  Once 11/11/2020 0212 10/20/2020 0306   10/24/2020 0215  ampicillin-sulbactam (UNASYN) 1.5 g in sodium chloride 0.9 %  100 mL IVPB  Status:  Discontinued        1.5 g 200 mL/hr over 30 Minutes Intravenous  Once 12-20-19 0212 12-20-19 0214   12-20-19 0215  vancomycin (VANCOCIN) IVPB 1000 mg/200 mL premix  Status:  Discontinued        1,000 mg 200 mL/hr over 60 Minutes Intravenous  Once 12-20-19 [id number] 12-20-19 0314      Subjective/Interval History: Patient noted to be moaning this morning.  Remains lethargic.  Does not answer any questions    Assessment/Plan:  MSSA bacteremia/sepsis secondary to MSSA/left hip abscess/septic shock Patient was in the intensive care unit due to septic shock.  He required pressors.  Was eventually weaned off of pressors.  Cultures are growing MSSA.   Patient currently on cefazolin.  Rifampin was added by ID on 12/9.  Plan is for 6 weeks of IV cefazolin and oral rifampin followed by protracted indefinite course of  oral Keflex and oral rifampin. Echocardiogram does not show any vegetation. Blood cultures repeated on 12/5 and without any growth so far. Patient was not considered a good candidate for operative intervention of the left hip.  Interventional radiology was consulted and abscess has been drained. Continue to watch drain output. Plan is to repeat CT scan when output has decreased. Orthopedics and interventional radiology have been following  SVT/sinus tachycardia EKGs were reviewed with cardiology to make sure there is no other arrhythmia.  They also feel that this is all sinus tachycardia.  Likely being driven by his acute illness. Heart rate remained elevated despite escalating doses of metoprolol.  Subsequently patient was started on Cardizem.  Rate seems to be better controlled but still remains elevated.   TSH was normal at 1.6.  Slowly taper off metoprolol.  Heart rate is stable. Echocardiogram was also done as discussed above.   Systolic function was noted to be normal.  No significant valvular abnormalities noted.  Acute metabolic encephalopathy in the setting of history of stroke and vascular dementia Patient's baseline mentation not entirely clear.  Discussed with son who mentions that over the past 3 months patient has had a decline in his mentation.   Recent brain MRI did not show any acute process. Acute illness likely contributing to his encephalopathy along with the baseline dementia.  Cortisol level noted to be normal. Patient also with history of stroke with left hemiparesis. Patient has not made any progress over the last several days.  He appears to be slowly declining.  Considering all of his significant comorbidities prognosis remains poor   Acute kidney injury/hypernatremia Likely secondary to sepsis and prerenal acidemia.  Renal function has improved.   Due to hypernatremia free water dose was increased.  Sodium level has improved.  Free water dosage was decreased.     Microcytic anemia History of iron deficiency is noted.  Drop in hemoglobin dilutional.  No evidence of overt bleeding   Transaminitis Thought to be due to septic shock.  Although AST was not noted to be significantly elevated.  LFTs remain elevated but stable.  Continue to monitor periodically.  Diabetes mellitus type 2, uncontrolled with hyperglycemia/hypoglycemia HbA1c 8.1 when last checked.  No further episodes of hypoglycemia noted.  CBGs are stable.  Continue current dose of Lantus and SSI for now.  Also on scheduled NovoLog.    History of dysphagia Currently on tube feedings via NG tube.  Remains quite encephalopathic.  Not safe for oral trials yet.  Speech therapy has been following intermittently.  Goals  of care Have had multiple discussions with patient's son regarding patient's trajectory over the last several months.  He was told about his severity of illness.  Also told about the fact that patient has a history of vascular dementia which will not get any better.  Initially patient's son wanted to continue full scope of care.   Subsequently son came and visited the patient and he realized that the patient was sicker than he had expected.  CODE STATUS was changed to DNR.   Palliative care was subsequently consulted as patient was not showing much improvement.  Patient has been slowly declining.  Discussed with palliative care.  Apparently patient's son's are having difficulty reconciling with patient's condition.  They will continue to attempt to talk to them and hopefully transition patient to comfort care and hospice.  Patient not a candidate for escalation of care.   Pressure injury Pressure Injury 08/07/20 Buttocks Right;Medial;Left Stage 2 -  Partial thickness loss of dermis presenting as a shallow open injury with a red, pink wound bed without slough. (Active)  08/07/20 1914  Location: Buttocks  Location Orientation: Right;Medial;Left  Staging: Stage 2 -  Partial thickness  loss of dermis presenting as a shallow open injury with a red, pink wound bed without slough.  Wound Description (Comments):   Present on Admission: Yes    Severe malnutrition Nutrition Problem: Severe Malnutrition Etiology: chronic illness (dementia)  Signs/Symptoms: severe fat depletion,severe muscle depletion,percent weight loss (15.4% weight loss in 9 months) Percent weight loss: 15.4 % (less than 9 months)  Interventions: Tube feeding,Prostat,MVI   DVT Prophylaxis: Subcutaneous heparin Code Status: DNR Family Communication: Son has been updated periodically.  Now palliative care is in conversation with him. Disposition Plan: We are looking at transition to comfort care/hospice.  Palliative care is following.  Status is: Inpatient  Remains inpatient appropriate because:Altered mental status, IV treatments appropriate due to intensity of illness or inability to take PO and Inpatient level of care appropriate due to severity of illness   Dispo:  Patient From: Skilled Nursing Facility  Planned Disposition: Skilled Nursing Facility  Expected discharge date: 11/02/2020  Medically stable for discharge: No      Medications:  Scheduled: . brimonidine  1 drop Both Eyes TID  . Chlorhexidine Gluconate Cloth  6 each Topical Daily  . diltiazem  60 mg Per Tube Q8H  . feeding supplement (PROSource TF)  45 mL Per Tube Daily  . free water  300 mL Per Tube Q4H  . heparin injection (subcutaneous)  5,000 Units Subcutaneous Q8H  . insulin aspart  0-15 Units Subcutaneous Q4H  . insulin glargine  22 Units Subcutaneous BID  . latanoprost  1 drop Both Eyes QHS  . metoprolol tartrate  50 mg Per Tube BID  . pantoprazole (PROTONIX) IV  40 mg Intravenous QHS  . sodium chloride flush  5 mL Intracatheter Q8H   Continuous: . sodium chloride Stopped (10/25/20 0603)  .  ceFAZolin (ANCEF) IV 2 g (11/01/20 0535)  . feeding supplement (OSMOLITE 1.2 CAL) 65 mL/hr at 11/01/20 0628  . rifampin  (RIFADIN) IVPB 300 mg (11/01/20 0093)   GHW:EXHBZJ chloride, acetaminophen, docusate, HYDROmorphone (DILAUDID) injection, metoprolol tartrate, oxyCODONE, pneumococcal 23 valent vaccine, polyethylene glycol   Objective:  Vital Signs  Vitals:   10/31/20 1922 10/31/20 2311 11/01/20 0300 11/01/20 0347  BP: 122/71 123/63 (!) 114/49   Pulse: (!) 110 (!) 105    Resp: 20 18 20    Temp: (!) 97.1 F (36.2 C) (!)  97.2 F (36.2 C) (!) 97.2 F (36.2 C)   TempSrc: Oral Oral Oral   SpO2: 100% 100% 100%   Weight:    75 kg  Height:        Intake/Output Summary (Last 24 hours) at 11/01/2020 0946 Last data filed at 11/01/2020 5456 Gross per 24 hour  Intake 5 ml  Output 3945 ml  Net -3940 ml   Filed Weights   10/30/20 2116 10/31/20 0131 11/01/20 0347  Weight: 75.2 kg 75.2 kg 75 kg    General appearance: Lethargic.  Opens his eyes briefly.  Noted to be moaning. Resp: Tachypnea noted.  However lungs appear to be without any wheezing rales or rhonchi.  Coarse breath sounds noted.   Cardio: S1-S2 is tachycardic regular.  No S3-S4.  No rubs murmurs or bruit GI: Abdomen is soft.  Nontender nondistended.  Bowel sounds are present normal.  No masses organomegaly Extremities: No edema.   Neurologic: known left hemiparesis        Lab Results:  Data Reviewed: I have personally reviewed following labs and imaging studies  CBC: Recent Labs  Lab 10/26/20 0212 10/28/20 0048 10/30/20 0301  WBC 8.9 9.0 9.9  NEUTROABS 6.5  --   --   HGB 10.8* 8.8* 8.8*  HCT 31.1* 25.7* 25.2*  MCV 76.0* 75.1* 75.4*  PLT 155 200 286    Basic Metabolic Panel: Recent Labs  Lab 10/28/20 0048 10/29/20 0313 10/30/20 0301 10/31/20 0333 11/01/20 0601  NA 141 142 139 136 135  K 4.0 4.8 4.6 4.4 4.5  CL 112* 110 106 104 101  CO2 20* 24 24 23 26   GLUCOSE 276* 132* 147* 238* 241*  BUN 22 19 16 18 18   CREATININE 0.95 0.88 0.81 0.96 0.90  CALCIUM 7.3* 7.7* 7.6* 7.4* 7.7*    GFR: Estimated Creatinine  Clearance: 71.8 mL/min (by C-G formula based on SCr of 0.9 mg/dL).  Liver Function Tests: Recent Labs  Lab 10/26/20 0212 10/27/20 0244  AST 132* 71*  ALT 47* 30  ALKPHOS 189* 176*  BILITOT 0.5 1.1  PROT 6.8 6.5  ALBUMIN 1.3* 1.3*     CBG: Recent Labs  Lab 10/31/20 1625 10/31/20 1916 10/31/20 2308 11/01/20 0331 11/01/20 0729  GLUCAP 230* 210* 206* 238* 211*      Recent Results (from the past 240 hour(s))  Culture, blood (routine x 2)     Status: None   Collection Time: 10/22/20 11:40 AM   Specimen: BLOOD LEFT ARM  Result Value Ref Range Status   Specimen Description BLOOD LEFT ARM  Final   Special Requests   Final    BOTTLES DRAWN AEROBIC AND ANAEROBIC Blood Culture adequate volume   Culture   Final    NO GROWTH 5 DAYS Performed at [facility], [address], [place], [address] [place]    Report Status 10/27/2020 FINAL  Final  Culture, blood (routine x 2)     Status: None   Collection Time: 10/22/20 11:55 AM   Specimen: BLOOD LEFT HAND  Result Value Ref Range Status   Specimen Description BLOOD LEFT HAND  Final   Special Requests   Final    BOTTLES DRAWN AEROBIC AND ANAEROBIC Blood Culture adequate volume   Culture   Final    NO GROWTH 5 DAYS Performed at [facility], [address], [place], [address] [place]    Report Status 10/27/2020 FINAL  Final      Radiology Studies: No results found.  LOS: 11 days   Darrio Bade [employer] on [email or web address]  11/01/2020, 9:46 AM

## 2020-11-01 NOTE — Consult Note (Addendum)
WOC Nurse Consult Note: Patient receiving care in [facility] 2W29 Consult completed remotely after review of chart and [place] to bedside nurse Reason for Consult: Sacral wound Wound type: Unstageable Pressure Injury POA: Yes Wound bed: Moist with slough Drainage (amount, consistency, odor) Moderate amount of sanguinous drainage.  Dressing procedure/placement/frequency: Clean sacral wound with no rinse cleanser. Pat dry then apply Aquacel Advantage [place] # [phone]) to the wound and cover with foam dressing. Change daily.   Monitor the wound area(s) for worsening of condition such as: Signs/symptoms of infection, increase in size, development of or worsening of odor, development of pain, or increased pain at the affected locations.   Notify the medical team if any of these develop.  Thank you for the consult. WOC nurse will not follow at this time.   Please re-consult the WOC team if needed.  Renaldo Reel Katrinka Blazing, MSN, RN, CMSRN, Angus Seller, [facility] Wound Treatment Associate Pager [phone]

## 2020-11-01 NOTE — Progress Notes (Signed)
Palliative:  HPI: 79 y.o.malewith past medical history of stroke, vascular dementia, hypertension, hyperlipidemia, diabetes, GERD, dysphagia, chronic urinary retention with indwelling catheter due to BPH, L hemiarthroplasty 09/2021[facility].CT A/P found left hip abscess with MSSA bacteremia. Has Cortrak in place with ongoing decreased mental status. Overall prognosis poor. Palliative care requested to continue goals of care conversations with family.  I met again today at Mr. Johannes bedside but no visitors present. He appears extremely lethargic and with ongoing mild labored breathing. Unable to follow commands. I do feel that he is at end of life.   I called and spoke with son, Marcus Downs. Marcus Downs along with other family members: Mirian Capuchin (Cassandra), Shirlene will meet with me tomorrow 11/02/20 1100 am at bedside to further discuss goals of care and where we go from here. No changes today.   Exam: Somnolent. Attempts to open eyes but even more difficult to arouse today. Breathing continues to be mildly labored but otherwise resting comfortably.   Plan:  - Family meeting for goals of care tomorrow 11/02/20 4849 am.   15 min  Vinie Sill, NP Palliative Medicine Team Pager [phone] (Please see [email or web address] for schedule) Team Phone [phone]    Greater than 50%  of this time was spent counseling and coordinating care related to the above assessment and plan

## 2020-11-02 LAB — COMPREHENSIVE METABOLIC PANEL
ALT: 10 U/L (ref 0–44)
AST: 22 U/L (ref 15–41)
Albumin: 1 g/dL — ABNORMAL LOW (ref 3.5–5.0)
Alkaline Phosphatase: 192 U/L — ABNORMAL HIGH (ref 38–126)
Anion gap: 7 (ref 5–15)
BUN: 18 mg/dL (ref 8–23)
CO2: 25 mmol/L (ref 22–32)
Calcium: 7.6 mg/dL — ABNORMAL LOW (ref 8.9–10.3)
Chloride: 104 mmol/L (ref 98–111)
Creatinine, Ser: 0.95 mg/dL (ref 0.61–1.24)
GFR, Estimated: >60 mL/min (ref >60)
Glucose, Bld: 206 mg/dL — ABNORMAL HIGH (ref 70–99)
Potassium: 4.8 mmol/L (ref 3.5–5.1)
Sodium: 136 mmol/L (ref 135–145)
Total Bilirubin: 0.7 mg/dL (ref 0.3–1.2)
Total Protein: 6.9 g/dL (ref 6.5–8.1)

## 2020-11-02 LAB — URINALYSIS, ROUTINE W REFLEX MICROSCOPIC
Bilirubin Urine: NEGATIVE
Glucose, UA: NEGATIVE mg/dL
Ketones, ur: NEGATIVE mg/dL
Nitrite: NEGATIVE
Protein, ur: NEGATIVE mg/dL
Specific Gravity, Urine: 1.005 (ref 1.005–1.030)
WBC, UA: >50 WBC/hpf — ABNORMAL HIGH (ref 0–5)
pH: 6 (ref 5.0–8.0)

## 2020-11-02 LAB — CBC
HCT: 26.4 % — ABNORMAL LOW (ref 39.0–52.0)
Hemoglobin: 8.9 g/dL — ABNORMAL LOW (ref 13.0–17.0)
MCH: 25.9 pg — ABNORMAL LOW (ref 26.0–34.0)
MCHC: 33.7 g/dL (ref 30.0–36.0)
MCV: 76.7 fL — ABNORMAL LOW (ref 80.0–100.0)
Platelets: 351 10*3/uL (ref 150–400)
RBC: 3.44 MIL/uL — ABNORMAL LOW (ref 4.22–5.81)
RDW: 19.8 % — ABNORMAL HIGH (ref 11.5–15.5)
WBC: 9.6 10*3/uL (ref 4.0–10.5)
nRBC: 0 % (ref 0.0–0.2)

## 2020-11-02 LAB — GLUCOSE, CAPILLARY
Glucose-Capillary: 121 mg/dL — ABNORMAL HIGH (ref 70–99)
Glucose-Capillary: 125 mg/dL — ABNORMAL HIGH (ref 70–99)
Glucose-Capillary: 157 mg/dL — ABNORMAL HIGH (ref 70–99)
Glucose-Capillary: 236 mg/dL — ABNORMAL HIGH (ref 70–99)
Glucose-Capillary: 215 mg/dL — ABNORMAL HIGH (ref 70–99)
Glucose-Capillary: 220 mg/dL — ABNORMAL HIGH (ref 70–99)
Glucose-Capillary: 222 mg/dL — ABNORMAL HIGH (ref 70–99)

## 2020-11-02 MED ORDER — MUPIROCIN 2 % EX OINT
TOPICAL_OINTMENT | Freq: Two times a day (BID) | CUTANEOUS | Status: DC
  Administered 2020-11-10: 1 via TOPICAL
  Filled 2020-11-02 (×6): qty 22

## 2020-11-02 NOTE — Progress Notes (Signed)
Foley catheter exchanged due to leaking of urine around foley. Old foley catheter removed and noted sediment and pus at end of catheter. Old catheter irrigated and assessed. No holes present and catheter patent. New 16 Fr. Non-latex foley inserted without resistance. Noted yellow/cloudy urine with large amounts sediment returned. Patient tolerated well. Provider paged and updated on above and request for urine culture order at this time.

## 2020-11-02 NOTE — Progress Notes (Signed)
PROGRESS NOTE    Marcus Downs.  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  He was found to have abscess surrounding the arthroplasty hardware.  Not a surgical candidate. Remains in poor overall clinical status.  Treated for MSSA infection.   Assessment & Plan:   Principal Problem:   MSSA bacteremia Active Problems:   Protein-calorie malnutrition, severe (HCC)   Encounter for nasogastric (NG) tube placement   Severe sepsis (HCC)   Abscess of left hip   Hypernatremia   Abscess   Acute kidney injury (nontraumatic) (HCC)   Dehydration   Elevated liver function tests   Prosthetic hip infection (HCC)   Hyperkalemia  MSSA bacteremia/sepsis secondary to MSSA left hip abscess with septic shock: Treated with vasopressors and weaned off. Blood cultures 12/4, MSSA bacteremia Hip abscess 12/4, MSSA Urine culture 12/4, MSSA Repeat blood cultures negative.  Echocardiogram without any vegetation.  Not a good candidate for operative removal/spacer or multistage surgery. IR guided abscess drain, continues to drain.  Overall with poor clinical improvement. Currently remains on cefazolin and rifampicin.  Plan for 6 weeks of therapy and chronic suppressive therapy if he is able to survive this infection.  SVT and sinus tachycardia: Improving.  Treated with Cardizem.  Now on Cardizem and metoprolol and acceptable.  TSH 1.6.  Acute metabolic encephalopathy in the setting of history of a stroke and  underlying vascular dementia: Gradual worsening of the mental status.  Repeat MRI with no evidence of acute process.  Does have history of vascular dementia.  Poor prognosis.  Not waking up. Remains on Dobbhoff tube feeding. Poor mentation.  No appropriate clinical response. Followed by palliative.  Scheduled for palliative meeting today. Hospice appropriate if agreed by family.  Acute kidney injury with hypernatremia: Sodium level improved with increasing free water flush.  Type 2 diabetes, uncontrolled with hyperglycemia: A1c 8.1.  Appropriate on current doses of Lantus and sliding scale insulin.  Dysphagia: Currently getting feeding through NG tube.  Remains encephalopathic.  Not safe for oral trials.  Speech therapy following.  Not a good candidate for PEG tube with poor clinical status and short span of life.  Palliative discussion. Family DNR.  Pressure Injury 08/07/20 Buttocks Right;Medial;Left Stage 2 -  Partial thickness loss of dermis presenting as a shallow open injury with a red, pink wound bed without slough. (Active)  08/07/20 1914  Location: Buttocks  Location Orientation: Right;Medial;Left  Staging: Stage 2 -  Partial thickness loss of dermis presenting as a shallow open injury with a red, pink wound bed without slough.  Wound Description (Comments):   Present on Admission: Yes     Pressure Injury 11/02/20 Penis (Active)  11/02/20 0344  Location: Penis (distal meatus)  Location Orientation:   Staging:   Wound Description (Comments):   Present on Admission:    Severe protein calorie malnutrition: Augmented with tube feeding.  Continue. Nutrition Status: Nutrition Problem: Severe Malnutrition Etiology: chronic illness (dementia) Signs/Symptoms: severe fat depletion,severe muscle depletion,percent weight loss (15.4% weight loss in 9 months) Percent weight loss: 15.4 % (less than 9 months) Interventions: Tube feeding,Prostat,MVI  DVT prophylaxis: heparin  injection 5,000 Units Start: 10/22/20 1400 SCDs Start: 11/11/2020 1213   Code Status: DNR Family Communication: Palliative care meeting with patient's family today. Disposition Plan: Status is: Inpatient  Remains inpatient appropriate because:Altered mental status and Inpatient level of care appropriate due to severity of illness   Dispo:  Patient From: Skilled Nursing Facility  Planned Disposition: Skilled Nursing Facility  Expected discharge date: 11/06/2020  Medically stable for discharge: No          Consultants:   Orthopedics  Interventional radiology  Palliative medicine  ID  Procedures:   Left hip drain  Antimicrobials:  Antibiotics Given (last 72 hours)    Date/Time Action Medication Dose Rate   10/30/20 2202 New Bag/Given   ceFAZolin (ANCEF) IVPB 2g/100 mL premix 2 g 200 mL/hr   10/30/20 2229 New Bag/Given   rifampin (RIFADIN) 300 mg in sodium chloride 0.9 % 100 mL IVPB 300 mg 200 mL/hr   10/31/20 0526 New Bag/Given   ceFAZolin (ANCEF) IVPB 2g/100 mL premix 2 g 200 mL/hr   10/31/20 0941 New Bag/Given   rifampin (RIFADIN) 300 mg in sodium chloride 0.9 % 100 mL IVPB 300 mg 200 mL/hr   10/31/20 1319 New Bag/Given   ceFAZolin (ANCEF) IVPB 2g/100 mL premix 2 g 200 mL/hr   10/31/20 2145 New Bag/Given   ceFAZolin (ANCEF) IVPB 2g/100 mL premix 2 g 200 mL/hr   10/31/20 2254 New Bag/Given   rifampin (RIFADIN) 300 mg in sodium chloride 0.9 % 100 mL IVPB 300 mg 200 mL/hr   11/01/20 0535 New Bag/Given   ceFAZolin (ANCEF) IVPB 2g/100 mL premix 2 g 200 mL/hr   11/01/20 4166 New Bag/Given   rifampin (RIFADIN) 300 mg in sodium chloride 0.9 % 100 mL IVPB 300 mg 200 mL/hr   11/01/20 1402 New Bag/Given   ceFAZolin (ANCEF) IVPB 2g/100 mL premix 2 g 200 mL/hr   11/01/20 2146 New Bag/Given   rifampin (RIFADIN) 300 mg in sodium chloride 0.9 % 100 mL IVPB 300 mg 200 mL/hr   11/01/20 2239 New Bag/Given   ceFAZolin (ANCEF) IVPB 2g/100 mL premix 2 g 200 mL/hr   11/02/20  0631 New Bag/Given   ceFAZolin (ANCEF) IVPB 2g/100 mL premix 2 g 200 mL/hr   11/02/20 1000 New Bag/Given   rifampin (RIFADIN) 300 mg in sodium chloride 0.9 % 100 mL IVPB 300 mg 200 mL/hr   11/02/20 1253 New Bag/Given   ceFAZolin (ANCEF) IVPB 2g/100 mL premix 2 g 200 mL/hr         Subjective: Patient seen and examined.  Was not able to communicate.  He remains persistently lethargic and encephalopathic.  He moaned on stimulation. Foley catheter was replaced overnight.  Afebrile.  Objective: Vitals:   11/01/20 2000 11/01/20 2256 11/02/20 0516 11/02/20 0825  BP:  (!) 116/53 (!) 151/69   Pulse:  92 97   Resp:  18 20   Temp:  98 F (36.7 C) 98.2 F (36.8 C)   TempSrc:   Oral   SpO2: 100% 100% 100%   Weight:    71.5 kg  Height:        Intake/Output Summary (Last 24 hours) at 11/02/2020 1315 Last data filed at 11/02/2020 1303 Gross per 24 hour  Intake 875 ml  Output 3320 ml  Net -2445 ml   Filed Weights   10/31/20 0131 11/01/20 0347 11/02/20 0825  Weight: 75.2 kg 75 kg 71.5 kg    Examination:  General exam: Sick looking gentleman who  is mostly sleepy and lethargic.  Unable to have communication. Not following any commands. Respiratory system: Clear to auscultation. Respiratory effort normal.  Mostly conducted airway sounds. Cardiovascular system: S1 & S2 heard, RRR Gastrointestinal system: Soft.  Not distended.  Bowel sounds present.   Central nervous system: Sleepy, lethargic.  Inappropriate response to painful stimuli. Psychiatry: Judgement and insight appear compromised. Left hip with JP drain, serosanguineous drainage present.    Data Reviewed: I have personally reviewed following labs and imaging studies  CBC: Recent Labs  Lab 10/28/20 0048 10/30/20 0301 11/02/20 0025  WBC 9.0 9.9 9.6  HGB 8.8* 8.8* 8.9*  HCT 25.7* 25.2* 26.4*  MCV 75.1* 75.4* 76.7*  PLT 200 286 351   Basic Metabolic Panel: Recent Labs  Lab 10/29/20 0313 10/30/20 0301  10/31/20 0333 11/01/20 0601 11/02/20 0025  NA 142 139 136 135 136  K 4.8 4.6 4.4 4.5 4.8  CL 110 106 104 101 104  CO2 24 24 23 26 25   GLUCOSE 132* 147* 238* 241* 206*  BUN 19 16 18 18 18   CREATININE 0.88 0.81 0.96 0.90 0.95  CALCIUM 7.7* 7.6* 7.4* 7.7* 7.6*   GFR: Estimated Creatinine Clearance: 64.8 mL/min (by C-G formula based on SCr of 0.95 mg/dL). Liver Function Tests: Recent Labs  Lab 10/27/20 0244 11/02/20 0025  AST 71* 22  ALT 30 10  ALKPHOS 176* 192*  BILITOT 1.1 0.7  PROT 6.5 6.9  ALBUMIN 1.3* 1.0*   No results for input(s): LIPASE, AMYLASE in the last 168 hours. No results for input(s): AMMONIA in the last 168 hours. Coagulation Profile: No results for input(s): INR, PROTIME in the last 168 hours. Cardiac Enzymes: No results for input(s): CKTOTAL, CKMB, CKMBINDEX, TROPONINI in the last 168 hours. BNP (last 3 results) No results for input(s): PROBNP in the last 8760 hours. HbA1C: No results for input(s): HGBA1C in the last 72 hours. CBG: Recent Labs  Lab 11/01/20 2255 11/02/20 0258 11/02/20 0514 11/02/20 0725 11/02/20 1131  GLUCAP 177* 121* 125* 157* 236*   Lipid Profile: No results for input(s): CHOL, HDL, LDLCALC, TRIG, CHOLHDL, LDLDIRECT in the last 72 hours. Thyroid Function Tests: No results for input(s): TSH, T4TOTAL, FREET4, T3FREE, THYROIDAB in the last 72 hours. Anemia Panel: No results for input(s): VITAMINB12, FOLATE, FERRITIN, TIBC, IRON, RETICCTPCT in the last 72 hours. Sepsis Labs: No results for input(s): PROCALCITON, LATICACIDVEN in the last 168 hours.  No results found for this or any previous visit (from the past 240 hour(s)).       Radiology Studies: No results found.      Scheduled Meds: . brimonidine  1 drop Both Eyes TID  . Chlorhexidine Gluconate Cloth  6 each Topical Daily  . diltiazem  60 mg Per Tube Q8H  . feeding supplement (PROSource TF)  45 mL Per Tube Daily  . free water  300 mL Per Tube Q4H  . heparin  injection (subcutaneous)  5,000 Units Subcutaneous Q8H  . insulin aspart  0-15 Units Subcutaneous Q4H  . insulin glargine  22 Units Subcutaneous BID  . latanoprost  1 drop Both Eyes QHS  . metoprolol tartrate  50 mg Per Tube BID  . mupirocin ointment   Topical BID  . pantoprazole (PROTONIX) IV  40 mg Intravenous QHS  . sodium chloride flush  5 mL Intracatheter Q8H   Continuous Infusions: . sodium chloride 250 mL (11/01/20 2145)  .  ceFAZolin (ANCEF) IV 2 g (11/02/20 1253)  . feeding supplement (OSMOLITE 1.2 CAL)  1,000 mL (11/02/20 0518)  . rifampin (RIFADIN) IVPB 300 mg (11/02/20 1000)     LOS: 12 days    Time spent: 30 minutes    Dorcas CarrowKuber Heraclio Seidman, MD [facility] Pager [phone]

## 2020-11-02 NOTE — Progress Notes (Signed)
Noted that patient leaking large amounts of urine from penis around foley catheter exit site of meatus. Also noted pressure injury to distal meatus where foley catheter rests. Patient confused and often rubbing and figeting hand around penis and folwy catheter with safety mitt on hand. Called Provider Opyd and updated. Provider advised to replace foley catheter for possible hole in catheter causing leakage. Wound and ostomy nursing services consulted.

## 2020-11-02 NOTE — Plan of Care (Signed)

## 2020-11-02 NOTE — Consult Note (Signed)
WOC Nurse Consult Note: Patient receiving care in [facility] 2W29. Reason for Consult: urinary meatal wound Wound type: ulceration for chronic foley catheter use Pressure Injury POA: unknown Measurement: na Wound bed: pink Drainage (amount, consistency, odor) thick yellow Periwound: intact Dressing procedure/placement/frequency: Cleanse urinary meatus with CHG wipes as a part of routine foley care, then apply mupirocin ointment.   I have also entered an order to ensure his penis is on top of his legs, not tucked under the thighs.  Monitor the wound area(s) for worsening of condition such as: Signs/symptoms of infection,  Increase in size,  Development of or worsening of odor, Development of pain, or increased pain at the affected locations.  Notify the medical team if any of these develop.  Thank you for the consult.  Discussed plan of care with the patient and bedside nurse.  WOC nurse will not follow at this time.  Please re-consult the WOC team if needed.  Helmut Muster, RN, MSN, CWOCN, CNS-BC, pager [phone]

## 2020-11-02 NOTE — Plan of Care (Signed)

## 2020-11-02 NOTE — Progress Notes (Signed)
Palliative:  HPI:79 y.o.malewith past medical history of stroke, vascular dementia, hypertension, hyperlipidemia, diabetes, GERD, dysphagia, chronic urinary retention with indwelling catheter due to BPH, L hemiarthroplasty 09/2021[facility].CT A/P found left hip abscess with MSSA bacteremia. Has Cortrak in place with ongoing decreased mental status. Overall prognosis poor. Palliative care requested to continue goals of care conversations with family.  I met today with Marcus Downs family: Marcus Downs, Marcus Downs, Marcus Downs. Marcus Downs continues to be sleepy but with all his family there he is more interactive Marcus talkative although difficult to understand. He does not open his eyes. He does stick out his tongue Marcus open mouth but not following any other commands. He does become tired Marcus goes back to sleep so we stepped out to talk privately.   We discussed Marcus Downs general physical Marcus cognitive decline that really began to decline ~1 month ago after a fall. He did show some improvement but continued with more deficits. We discussed the main concern today that Marcus Downs continues with significant impairments Marcus lack of expected progress Marcus improvement. We feel this is due to his progressing dementia that is preventing him from bouncing back from his acute illness even though we are adequately treating his infections.   We discussed main concern that we are prolonging his life with artificial tubing Marcus concern that we have nothing more to offer to help him to improve further. Family agree that he would not want a long term feeding tube placed to prolong life in this state. When we discussed what Marcus Downs would tell us it became clear that he would not want to live in this condition Marcus with this poor quality of life. However, the question now for family is making sure that they have provided Marcus Downs with adequate time to give him  adequate opportunity to show this improvement.   We agree to continue current measures. No plans for escalation of care or moving to ICU. Will need to call Marcus discuss further with family comfort measures if he was further decline.   All questions/concerns addressed. Emotional support provided.   Exam: Slightly more alert when surrounded by family Marcus more verbal but still with garbled speech difficult to understand. Able to follow just a couple simple commands. Does not open eyes. Breathing more stable today Marcus not as labored. Concern for aspiration. Cortrak in place. HR 90s.   Plan: - Continue current level of care.  - NO plans to pursue any escalation of care.  - Ongoing support to family (will ask one of my colleagues to follow up).   [id number] 70 min  Vinie Sill, NP Palliative Medicine Team Pager [phone] (Please see [email or web address] for schedule) Team Phone [phone]    Greater than 50%  of this time was spent counseling Marcus coordinating care related to the above assessment Marcus plan

## 2020-11-03 LAB — GLUCOSE, CAPILLARY
Glucose-Capillary: 172 mg/dL — ABNORMAL HIGH (ref 70–99)
Glucose-Capillary: 141 mg/dL — ABNORMAL HIGH (ref 70–99)
Glucose-Capillary: 217 mg/dL — ABNORMAL HIGH (ref 70–99)
Glucose-Capillary: 207 mg/dL — ABNORMAL HIGH (ref 70–99)
Glucose-Capillary: 228 mg/dL — ABNORMAL HIGH (ref 70–99)

## 2020-11-03 LAB — URINE CULTURE: Culture: 40000 — AB

## 2020-11-03 NOTE — Progress Notes (Signed)
PROGRESS NOTE    Marcus J Slevin Jr.  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  He was found to have abscess surrounding the arthroplasty hardware.  Not a surgical candidate. Remains in poor overall clinical status.  Treated for MSSA infection.   Assessment & Plan:   Principal Problem:   MSSA bacteremia Active Problems:   Protein-calorie malnutrition, severe (HCC)   Encounter for nasogastric (NG) tube placement   Severe sepsis (HCC)   Abscess of left hip   Hypernatremia   Abscess   Acute kidney injury (nontraumatic) (HCC)   Dehydration   Elevated liver function tests   Prosthetic hip infection (HCC)   Hyperkalemia  MSSA bacteremia/sepsis secondary to MSSA left hip abscess with septic shock: Treated with vasopressors and weaned off. Blood cultures 12/4, MSSA bacteremia Hip abscess 12/4, MSSA Urine culture 12/4, MSSA Repeat blood cultures negative.  Echocardiogram without any vegetation.  Not a good candidate for operative removal/spacer or multistage surgery. IR guided abscess drain, continues to drain.  Overall with poor clinical improvement. Currently remains on cefazolin and rifampicin.  Total 6 weeks of antibiotics planned.  SVT and sinus tachycardia: Improving.  Treated with Cardizem.  Now on Cardizem and metoprolol and acceptable.  TSH 1.6.  Acute metabolic encephalopathy in the setting of history of stroke and underlying vascular dementia: Gradual worsening of the mental status.   Repeat MRI with no evidence of acute process.  Does have history of vascular dementia.   Remains on Dobbhoff tube feeding.  Not awake enough to have a swallow evaluation. Followed by palliative.  Hospice appropriate if agreed by family. Family continues to have difficult time making decisions, they would like to see how he does.  Acute kidney injury with hypernatremia: Sodium level improved with increasing free water flush.  Recheck tomorrow.  Type 2 diabetes, uncontrolled with hyperglycemia: A1c 8.1.  Appropriate on current doses of Lantus and sliding scale insulin.  Dysphagia: Currently getting feeding through NG tube.  Remains with poor mentation.  Not safe for oral trials.  Speech therapy following.  Not a good candidate for PEG tube with poor clinical status and short span of life.  Not desired to have PEG tube feeding. We can see next few days and continue to have palliative discussion. Pressure Injury 08/07/20 Buttocks Right;Medial;Left Stage 2 -  Partial thickness loss of dermis presenting as a shallow open injury with a red, pink wound bed without slough. (Active)  08/07/20 1914  Location: Buttocks  Location Orientation: Right;Medial;Left  Staging: Stage 2 -  Partial thickness loss of dermis presenting as a shallow open injury with a red, pink wound bed without slough.  Wound Description (Comments):   Present on Admission: Yes     Pressure Injury 11/02/20 Penis (Active)  11/02/20 0344  Location: Penis (distal meatus)  Location Orientation:   Staging:   Wound Description (Comments):   Present on Admission:    Severe protein calorie malnutrition: Augmented with tube feeding.  Continue. Nutrition Status: Nutrition Problem: Severe Malnutrition Etiology: chronic illness (dementia) Signs/Symptoms: severe fat depletion,severe muscle depletion,percent weight loss (15.4% weight loss in  9 months) Percent weight loss: 15.4 % (less than 9 months) Interventions: Tube  feeding,Prostat,MVI     DVT prophylaxis: heparin injection 5,000 Units Start: 10/22/20 1400 SCDs Start: 11-11-20 1213   Code Status: DNR Family Communication: Patient's son updated on the phone. Disposition Plan: Status is: Inpatient  Remains inpatient appropriate because:Altered mental status and Inpatient level of care appropriate due to severity of illness   Dispo:  Patient From: Skilled Nursing Facility  Planned Disposition: Skilled Nursing Facility  Expected discharge date: 11/06/2020  Medically stable for discharge: No          Consultants:   Orthopedics  Interventional radiology  Palliative medicine  ID  Procedures:   Left hip drain  Antimicrobials:  Antibiotics Given (last 72 hours)    Date/Time Action Medication Dose Rate   10/31/20 1319 New Bag/Given   ceFAZolin (ANCEF) IVPB 2g/100 mL premix 2 g 200 mL/hr   10/31/20 2145 New Bag/Given   ceFAZolin (ANCEF) IVPB 2g/100 mL premix 2 g 200 mL/hr   10/31/20 2254 New Bag/Given   rifampin (RIFADIN) 300 mg in sodium chloride 0.9 % 100 mL IVPB 300 mg 200 mL/hr   11/01/20 0535 New Bag/Given   ceFAZolin (ANCEF) IVPB 2g/100 mL premix 2 g 200 mL/hr   11/01/20 6387 New Bag/Given   rifampin (RIFADIN) 300 mg in sodium chloride 0.9 % 100 mL IVPB 300 mg 200 mL/hr   11/01/20 1402 New Bag/Given   ceFAZolin (ANCEF) IVPB 2g/100 mL premix 2 g 200 mL/hr   11/01/20 2146 New Bag/Given   rifampin (RIFADIN) 300 mg in sodium chloride 0.9 % 100 mL IVPB 300 mg 200 mL/hr   11/01/20 2239 New Bag/Given   ceFAZolin (ANCEF) IVPB 2g/100 mL premix 2 g 200 mL/hr   11/02/20 0631 New Bag/Given   ceFAZolin (ANCEF) IVPB 2g/100 mL premix 2 g 200 mL/hr   11/02/20 1000 New Bag/Given   rifampin (RIFADIN) 300 mg in sodium chloride 0.9 % 100 mL IVPB 300 mg 200 mL/hr   11/02/20 1253 New Bag/Given   ceFAZolin (ANCEF) IVPB 2g/100 mL premix 2 g 200 mL/hr   11/02/20 2114 New Bag/Given   rifampin (RIFADIN) 300 mg in sodium chloride 0.9 % 100 mL  IVPB 300 mg 200 mL/hr   11/02/20 2309 New Bag/Given  [medication not given at scheduled time due to unavailability. Pharmacy re-send.]   ceFAZolin (ANCEF) IVPB 2g/100 mL premix 2 g 200 mL/hr   11/03/20 0524 New Bag/Given   ceFAZolin (ANCEF) IVPB 2g/100 mL premix 2 g 200 mL/hr   11/03/20 1007 New Bag/Given   rifampin (RIFADIN) 300 mg in sodium chloride 0.9 % 100 mL IVPB 300 mg 200 mL/hr         Subjective: Patient seen and examined.  No overnight events.  Unable to communicate, follows simple commands.  Remains lethargic and sleepy. Drain output reported 110 cc last 24 hours.  Afebrile.  No family at the bedside.  Objective: Vitals:   11/02/20 2051 11/03/20 0047 11/03/20 0508 11/03/20 1002  BP: (!) 141/60 127/62 135/64 134/82  Pulse: (!) 113 (!) 102 100 (!) 113  Resp: 18 (!) 24 20 20   Temp: 99.5 F (37.5 C)  98 F (36.7 C) 97.9 F (36.6 C)  TempSrc: Oral  Oral Axillary  SpO2: 100% 100% 99% 100%  Weight:      Height:        Intake/Output Summary (Last 24 hours) at 11/03/2020 1111 Last data filed at 11/03/2020 1007 Gross per 24 hour  Intake  988 ml  Output 2760 ml  Net -1772 ml   Filed Weights   10/31/20 0131 11/01/20 0347 11/02/20 0825  Weight: 75.2 kg 75 kg 71.5 kg    Examination:  General exam: Sick looking gentleman who is mostly sleepy.  Wakes up on his stimulation and follows simple commands.  Tries to answer some questions, however incomprehensible.  Respiratory system: Clear to auscultation. Respiratory effort normal.  Mostly conducted airway sounds. Cardiovascular system: S1 & S2 heard, RRR Gastrointestinal system: Soft.  Not distended.  Bowel sounds present.   Central nervous system: Sleepy, lethargic.   Psychiatry: Judgement and insight appear compromised. Left hip with JP drain, serosanguineous drainage present.    Data Reviewed: I have personally reviewed following labs and imaging studies  CBC: Recent Labs  Lab 10/28/20 0048 10/30/20 0301  11/02/20 0025  WBC 9.0 9.9 9.6  HGB 8.8* 8.8* 8.9*  HCT 25.7* 25.2* 26.4*  MCV 75.1* 75.4* 76.7*  PLT 200 286 351   Basic Metabolic Panel: Recent Labs  Lab 10/29/20 0313 10/30/20 0301 10/31/20 0333 11/01/20 0601 11/02/20 0025  NA 142 139 136 135 136  K 4.8 4.6 4.4 4.5 4.8  CL 110 106 104 101 104  CO2 24 24 23 26 25   GLUCOSE 132* 147* 238* 241* 206*  BUN 19 16 18 18 18   CREATININE 0.88 0.81 0.96 0.90 0.95  CALCIUM 7.7* 7.6* 7.4* 7.7* 7.6*   GFR: Estimated Creatinine Clearance: 64.8 mL/min (by C-G formula based on SCr of 0.95 mg/dL). Liver Function Tests: Recent Labs  Lab 11/02/20 0025  AST 22  ALT 10  ALKPHOS 192*  BILITOT 0.7  PROT 6.9  ALBUMIN 1.0*   No results for input(s): LIPASE, AMYLASE in the last 168 hours. No results for input(s): AMMONIA in the last 168 hours. Coagulation Profile: No results for input(s): INR, PROTIME in the last 168 hours. Cardiac Enzymes: No results for input(s): CKTOTAL, CKMB, CKMBINDEX, TROPONINI in the last 168 hours. BNP (last 3 results) No results for input(s): PROBNP in the last 8760 hours. HbA1C: No results for input(s): HGBA1C in the last 72 hours. CBG: Recent Labs  Lab 11/02/20 1535 11/02/20 1951 11/02/20 2246 11/03/20 0308 11/03/20 0730  GLUCAP 215* 220* 222* 172* 141*   Lipid Profile: No results for input(s): CHOL, HDL, LDLCALC, TRIG, CHOLHDL, LDLDIRECT in the last 72 hours. Thyroid Function Tests: No results for input(s): TSH, T4TOTAL, FREET4, T3FREE, THYROIDAB in the last 72 hours. Anemia Panel: No results for input(s): VITAMINB12, FOLATE, FERRITIN, TIBC, IRON, RETICCTPCT in the last 72 hours. Sepsis Labs: No results for input(s): PROCALCITON, LATICACIDVEN in the last 168 hours.  Recent Results (from the past 240 hour(s))  Culture, Urine     Status: None (Preliminary result)   Collection Time: 11/02/20  5:08 AM   Specimen: Urine, Random  Result Value Ref Range Status   Specimen Description URINE, RANDOM   Final   Special Requests NONE  Final   Culture   Final    CULTURE REINCUBATED FOR BETTER GROWTH Performed at [facility], [address], [place], [address] [place]    Report Status PENDING  Incomplete         Radiology Studies: No results found.      Scheduled Meds: . brimonidine  1 drop Both Eyes TID  . Chlorhexidine Gluconate Cloth  6 each Topical Daily  . diltiazem  60 mg Per Tube Q8H  . feeding supplement (PROSource TF)  45 mL Per Tube Daily  .  free water  300 mL Per Tube Q4H  . heparin injection (subcutaneous)  5,000 Units Subcutaneous Q8H  . insulin aspart  0-15 Units Subcutaneous Q4H  . insulin glargine  22 Units Subcutaneous BID  . latanoprost  1 drop Both Eyes QHS  . metoprolol tartrate  50 mg Per Tube BID  . mupirocin ointment   Topical BID  . pantoprazole (PROTONIX) IV  40 mg Intravenous QHS  . sodium chloride flush  5 mL Intracatheter Q8H   Continuous Infusions: . sodium chloride 250 mL (11/01/20 2145)  .  ceFAZolin (ANCEF) IV 2 g (11/03/20 0524)  . feeding supplement (OSMOLITE 1.2 CAL) 65 mL/hr at 11/03/20 0524  . rifampin (RIFADIN) IVPB 300 mg (11/03/20 1007)     LOS: 13 days    Time spent: 30 minutes    Dorcas Carrow, MD [facility] Pager [phone]

## 2020-11-03 NOTE — Progress Notes (Signed)
Referring Physician(s): Choi,J  Supervising Physician: Irish Lack  Patient Status:  [facility] - In-pt  Chief Complaint: Left thigh abscess  Subjective: Patient alert today, in right arm mitten.  Seems aware of presence, but not oriented or answering/engaging.  Light thigh drain intact.   Allergies: Tape  Medications: Prior to Admission medications   Medication Sig Start Date End Date Taking? Authorizing Provider  acetaminophen (TYLENOL) 325 MG tablet Take 650 mg by mouth every 6 (six) hours as needed for mild pain or fever. 08/14/20  Yes [provider]  Amino Acids-Protein Hydrolys (FEEDING SUPPLEMENT, PRO-STAT 64,) LIQD Take 30 mLs by mouth in the morning and at bedtime. For wound healing 08/15/20  Yes [provider]  aspirin 325 MG tablet Take 325 mg by mouth daily.   Yes [provider]  atorvastatin (LIPITOR) 40 MG tablet Take 1 tablet (40 mg total) by mouth daily. 08/14/20  Yes Rhetta Mura, MD  bisacodyl (DULCOLAX) 10 MG suppository Place 10 mg rectally as needed for moderate constipation.   Yes [provider]  brimonidine (ALPHAGAN P) 0.1 % SOLN Place 1 drop into both eyes 3 (three) times daily.   Yes [provider]  ferrous sulfate 325 (65 FE) MG tablet Take 325 mg by mouth daily.  08/31/18  Yes [name], MD  Insulin Aspart Prot & Aspart (NOVOLOG MIX 70/30 FLEXPEN [place]) Inject 14-18 Units into the skin See admin instructions. Inject 14 Units after breakfast,  Inject 18 units after lunch, inject 16 uints at dinner.   Yes [provider]  latanoprost (XALATAN) 0.005 % ophthalmic solution Place 1 drop into both eyes at bedtime.   Yes [provider]  magnesium hydroxide (MILK OF MAGNESIA) 400 MG/5ML suspension Take 30 mLs by mouth daily as needed for mild constipation.    Yes [provider]  metoprolol tartrate (LOPRESSOR) 25 MG tablet Take 1 tablet (25 mg total) by mouth 2 (two) times  daily. 08/14/20  Yes Rhetta Mura, MD  MULTIPLE VITAMIN PO Take 1 tablet by mouth daily. W/ Minerals to promote wound healing   Yes [provider]  NON FORMULARY Medpass TID d/t weight loss. 09/13/20  Yes [provider]  potassium chloride (KLOR-CON) 10 MEQ tablet TAKE 3 TABLETS BY MOUTH DAILY Patient taking differently: 30 mEq daily.  09/11/20  Yes [name], MD  Sodium Phosphates (RA SALINE ENEMA RE) Place 1 enema rectally daily as needed (for constipation).   Yes [provider]  sodium chloride 0.9 % infusion Inject 1,000 mLs into the vein daily. 215ml/ 1 hour    [provider]  sulfamethoxazole-trimethoprim (BACTRIM DS) 800-160 MG tablet Take 1 tablet by mouth 2 (two) times daily. For 10 days    [provider]  sulfamethoxazole-trimethoprim (BACTRIM) 400-80 MG tablet Take 1 tablet by mouth 2 (two) times daily. For 10 days    [provider]     Vital Signs: BP 134/82 (BP Location: Left Arm)   Pulse (!) 113   Temp 97.9 F (36.6 C) (Axillary)   Resp 20   Ht 6' (1.829 m)   Wt 157 lb 10.1 oz (71.5 kg)   SpO2 100%   BMI 21.38 kg/m   Physical Exam  NAD, alert but not engaging Left thigh drain intact, insertion site ok, serous output-110 mL documented yesterday.  Flushes easily.   Imaging: No results found.  Labs:  CBC: Recent Labs    10/26/20 0212 10/28/20 0048 10/30/20 0301 11/02/20  0025  WBC 8.9 9.0 9.9 9.6  HGB 10.8* 8.8* 8.8* 8.9*  HCT 31.1* 25.7* 25.2* 26.4*  PLT 155 200 286 351    COAGS: Recent Labs    11/04/2020 0100  INR 1.4*  APTT 42*    BMP: Recent Labs    08/12/20 0540 08/13/20 0548 08/13/20 0548 08/21/20 0000 09/25/20 0000 11-04-2020 0100 10/30/20 0301 10/31/20 0333 11/01/20 0601 11/02/20 0025  NA 146* 142   < > 138 142   < > 139 136 135 136  K 3.8 3.9  --  4.7 4.3   < > 4.6 4.4 4.5 4.8  CL 108 110  --  102 100   < > 106 104 101 104  CO2 26 22  --  25* 23*   < >  24 23 26 25   GLUCOSE 221* 161*  --   --   --    < > 147* 238* 241* 206*  BUN 25* 24*   < > 10 9   < > 16 18 18 18   CALCIUM 7.9* 7.6*  --  8.5* 9.7   < > 7.6* 7.4* 7.7* 7.6*  CREATININE 1.34* 1.04   < > 0.8 1.1   < > 0.81 0.96 0.90 0.95  GFRNONAA 50* >60  --  83.93 62.96   < > >60 >60 >60 >60  GFRAA 58* >60  --  >90 72.97  --   --   --   --   --    < > = values in this interval not displayed.    LIVER FUNCTION TESTS: Recent Labs    10/25/20 0543 10/26/20 0212 10/27/20 0244 11/02/20 0025  BILITOT 0.8 0.5 1.1 0.7  AST 153* 132* 71* 22  ALT 45* 47* 30 10  ALKPHOS 188* 189* 176* 192*  PROT 6.8 6.8 6.5 6.9  ALBUMIN 1.5* 1.3* 1.3* 1.0*    Assessment and Plan: Left hip abscess s/p abscess drain to the left thigh on 2020/11/04 Afebrile, more alert today but still confused Ongoing increased serous output from drain-- 110 mL overnight, 50 mL so far today.  Continue current drain management.  Monitor output trends.   Disposition pending, meeting with Palliative Care Team.   Electronically Signed: 11/04/20, PA 11/03/2020, 2:46 PM   I spent a total of 15 minutes at the the patient's bedside AND on the patient's hospital floor or unit, greater than 50% of which was counseling/coordinating care for left thigh abscess.

## 2020-11-03 NOTE — Progress Notes (Signed)
Daily Progress Note   Patient Name: Marcus Downs.       Date: 11/03/2020 DOB: 09/12/1941  Age: 79 y.o. MRN#: [id number] Attending Physician: Barb Merino, MD Primary Care Physician: Ria Bush, MD Admit Date: 10/22/2020  Reason for Consultation/Follow-up: To discuss complex medical decision making related to patient's goals of care    Subjective: Patient awake and speaking with me.  I understand about 25% of what he says, but the answers I understand are appropriate to my questions.  Patient indicates that yes, he is comfortable.  I read him the names of several family members and he tells me Yes they are my family.  Spoke with son Jeneen Rinks on the telephone.  He explains that they met with Palliative Medicine yesterday and would really like to give his father more time before removing the feeding tube.    I asked about where the patient would go on discharge from the hospital - Jeneen Rinks replied either to the skilled nursing facility or to [facility] depending on how he is doing.  Assessment: 79 yo male s/p CVA and with vascular dementia.  Has left hip abscess with drain in place on antibiotic therapy.  He is not a surgical candidate.  Patient is awake and talking at the moment.  Mental status may wax and wane.   Patient Profile/HPI:  79 y.o. male  with past medical history of stroke, vascular dementia, hypertension, hyperlipidemia, diabetes, GERD, dysphagia, chronic urinary retention with indwelling catheter due to BPH, L hemiarthroplasty 07/2020 admitted on 11/13/2020 from SNF with UTI and sepsis. CT A/P found left hip abscess with MSSA bacteremia. Has Cortrak in place with ongoing decreased mental status. Overall prognosis poor. Palliative care requested to continue goals of care  conversations with family.    Length of Stay: 13   Vital Signs: BP 134/82 (BP Location: Left Arm)   Pulse (!) 113   Temp 97.9 F (36.6 C) (Axillary)   Resp 20   Ht 6' (1.829 m)   Wt 71.5 kg   SpO2 100%   BMI 21.38 kg/m  SpO2: SpO2: 100 % O2 Device: O2 Device: Room Air O2 Flow Rate:         Palliative Assessment/Data: 20%     Palliative Care Plan    Recommendations/Plan:  Will order SLP eval as  patient is more alert.  Mental status may wax and wane.  PMT will continue to follow with you.  Patient is DNR, No escalation of care - but family would like to leave feeding tube in place for a while to give him a chance.  PMT will continue to follow with you.  Code Status:  DNR  Prognosis:  Poor prognosis given vascular dementia and weakness he is at high risk for acute decline or death from aspiration or lack of PO intake.  Discharge Planning:  To Be Determined  SNF vs [facility] was discussed with family.  Thank you for allowing the Palliative Medicine Team to assist in the care of this patient.  Total time spent:   35 min.     Greater than 50%  of this time was spent counseling and coordinating care related to the above assessment and plan.  Florentina Jenny, PA-C Palliative Medicine  Please contact Palliative MedicineTeam phone at [phone] for questions and concerns between 7 am - 7 pm.   Please see AMION for individual provider pager numbers.

## 2020-11-03 NOTE — Evaluation (Signed)
Clinical/Bedside Swallow Evaluation Patient Details  Name: Marcus Downs. MRN: [id number] Date of Birth: September 10, 1941  Today's Date: 11/03/2020 Time: SLP Start Time (ACUTE ONLY): 1508 SLP Stop Time (ACUTE ONLY): 1525 SLP Time Calculation (min) (ACUTE ONLY): 17 min  Past Medical History:  Past Medical History:  Diagnosis Date  . CRI (chronic renal insufficiency)   . 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 Diabetes mellitus type II 1992  . HLD (hyperlipidemia) 10/1998  . HTN (hypertension) 10/1998  . Primary open angle glaucoma    [name]  . RBBB   . Stroke [facility])    Left sided weakness   Past Surgical History:  Past Surgical History:  Procedure Laterality Date  . CATARACT EXTRACTION W/PHACO  09/09/2012   Procedure: CATARACT EXTRACTION PHACO AND INTRAOCULAR LENS PLACEMENT (IOC);  Surgeon: [name], MD;  Location: [facility] OR;  Service: Ophthalmology;  Laterality: Right;  . CATARACT EXTRACTION W/PHACO Left 03/31/2013   Procedure: CATARACT EXTRACTION PHACO AND INTRAOCULAR LENS PLACEMENT (IOC);  Surgeon: [name], MD;  Location: [facility] OR;  Service: Ophthalmology;  Laterality: Left;  . EYE SURGERY    . HIP ARTHROPLASTY Left 08/07/2020   Procedure: ARTHROPLASTY BIPOLAR HIP (HEMIARTHROPLASTY);  Surgeon: [name], MD;  Location: [facility] ORS;  Service: Orthopedics;  Laterality: Left;  . lipoma removal  1970's   Right shoulder  . US ECHOCARDIOGRAPHY  03/2013   mod LVH, EF 60%, normal wall motion   HPI:  Patient is a 79 y.o. male with PMH: CVA with residual dysphagia (dysphagia 1 and thin liquids was recommened on 08/14/20) and hemiplegia, GERD, DM-2, HTN, HLD, weight loss, s/p hip hemiarthroplasty. He presented to hospital from SNF, s/p hip fracture and surgery 3 months ago and was found to be septic wih a UTI and hip abscess. SLP was following pt from 12/4-12/10 but services were discontinued since lethargy was consistency limiting his participation and it was  requested that SLP be reconsulted if pt's status improved. Palliative care was consulted and prognosis judged to be poor "given vascular dementia and weakness he is at high risk for acute decline or death from aspiration or lack of PO intake." Pt was more during meeting with palliative care they therefore requested re-evaluation on 12/17.   Assessment / Plan / Recommendation Clinical Impression  Pt was seen for bedside swallow re-evaluation. His level of alertness was notably improved compared to when he was last seen by this SLP on 12/10. He communicated verbally with reduced speech intelligibility secondary to articulatory imprecision and reduced vocal intensity.  Oral mechanism exam was limited due to pt's difficulty following commands. He was edentulous and dentures were not found in the room by this SLP or seen by pt's RN. He exhibited a single cough after 4-5 consecutive swallows of thin liquids via straw, but he tolerated individual and up to 3 consecutive swallows without overt s/sx of aspiration. Mild lingual residue was noted with puree solids, but was cleared with a liquid wash and symptoms of pharyngeal residue were not observed. A dysphagia 1 (puree) diet with thin liquids is recommended at this time and SLP will follow pt. SLP Visit Diagnosis: Dysphagia, unspecified (R13.10)    Aspiration Risk  Mild aspiration risk    Diet Recommendation Dysphagia 1 (Puree);Thin liquid   Liquid Administration via: Cup;Straw Medication Administration: Via alternative means (via Cortrak or crushed and given p.o. with puree) Supervision: Full supervision/cueing for compensatory strategies Compensations: Slow rate;Small sips/bites Postural Changes: Seated upright  at 90 degrees    Other  Recommendations Oral Care Recommendations: Oral care BID   Follow up Recommendations Skilled Nursing facility      Frequency and Duration min 2x/week  2 weeks       Prognosis Prognosis for Safe Diet Advancement:  Good Barriers to Reach Goals: Cognitive deficits      Swallow Study   General Date of Onset: 11/10/2020 HPI: Patient is a 79 y.o. male with PMH: CVA with residual dysphagia (dysphagia 1 and thin liquids was recommened on 08/14/20) and hemiplegia, GERD, DM-2, HTN, HLD, weight loss, s/p hip hemiarthroplasty. He presented to hospital from SNF, s/p hip fracture and surgery 3 months ago and was found to be septic wih a UTI and hip abscess. SLP was following pt from 12/4-12/10 but services were discontinued since lethargy was consistency limiting his participation and it was requested that SLP be reconsulted if pt's status improved. Palliative care was consulted and prognosis judged to be poor "given vascular dementia and weakness he is at high risk for acute decline or death from aspiration or lack of PO intake." Pt was more during meeting with palliative care they therefore requested re-evaluation on 12/17. Type of Study: Bedside Swallow Evaluation Previous Swallow Assessment: 11/05/2020 Diet Prior to this Study: NPO;NG Tube Temperature Spikes Noted: No Respiratory Status: Room air History of Recent Intubation: No Behavior/Cognition: Requires cueing;Alert;Cooperative Oral Cavity Assessment: Within Functional Limits Oral Care Completed by SLP: Yes Oral Cavity - Dentition: Edentulous Baseline Vocal Quality: Other (comment);Normal Volitional Cough: Cognitively unable to elicit Volitional Swallow: Unable to elicit    Oral/Motor/Sensory Function Overall Oral Motor/Sensory Function:  (Pt unable to participate in OME due to difficulty following commands.)   Ice Chips Ice chips: Within functional limits Presentation: Spoon   Thin Liquid Thin Liquid: Impaired Presentation: Straw;Spoon Pharyngeal  Phase Impairments: Cough - Immediate    Nectar Thick Nectar Thick Liquid: Not tested   Honey Thick Honey Thick Liquid: Not tested   Puree Puree: Within functional limits Presentation: Spoon   Solid      Solid: Not tested     Lowell I. Vear Clock, MS, CCC-SLP Acute Rehabilitation Services Office number [phone] Pager [phone]  Scheryl Marten 11/03/2020,3:29 PM

## 2020-11-03 NOTE — Progress Notes (Signed)
Nutrition Follow-up  DOCUMENTATION CODES:   Severe malnutrition in context of chronic illness  INTERVENTION:  Continue tube feeds via Cortrak: - Osmolite 1.2 @ 65 ml/hr (1560 ml/day) - 67ml ProSource TF daily - Free water 300 ml Q4H  Tube feeding regimen provides 1912 kcal, 98 grams of protein, and 1279 ml of H2O.  Total free water with flushes: 3079 ml   NUTRITION DIAGNOSIS:   Severe Malnutrition related to chronic illness (dementia) as evidenced by severe fat depletion,severe muscle depletion,percent weight loss (15.4% weight loss in 9 months). -- ongoing  GOAL:   Patient will meet greater than or equal to 90% of their needs -- Met with TF  MONITOR:   Diet advancement,Labs,Weight trends,TF tolerance,Skin,I & O's  REASON FOR ASSESSMENT:   Consult Enteral/tube feeding initiation and management  ASSESSMENT:   79 year old male who presented to the ED on 12/04 as a code sepsis. PMH of HTN, vascular dementia, T2DM, GERD, dysphagia, chronic urinary retention with indwelling foley catheter, HLD, previous CVA with left hemiparesis, left femoral neck fracture s/p left hemiarthroplasty in September 2021. Pt admitted with severe sepsis secondary to UTI, MSSA bacteremia, possible left hip abscess.  12/04 - s/p CT guided abscess drain placement by IR, NG tube placed 12/06 - NG tube replaced with Cortrak, tip gastric  Plan for repeat CT of L hip/thigh once drain output <10 ml/day (for possible removal).   Discussed pt with RN.   Pt continues to have poor mentation and is still not safe for po trials per MD. Palliative Medicine has been working with pt/pt's family. After [place] discussion yesterday, decision was made to continue with the current plan of care with no plans for escalation of care. Pt's family does not wish to pursue long-term feeding tube, but would like to continue with Cortrak at this time and allow pt a few more days to see how he responds to current treatment  modalities. PMT plans to have ongoing discussions with family and will plan to discuss comfort measure if pt declines.    Current TF regimen: Osmolite 1.2 cal @ 46ml/hr, 1ml Prosource TF daily, 319ml free water Q4H. Tube feeding regimen provides 1912 kcal, 98 grams of protein, and 1279 ml of H2O (total free water with flushes: 3079 ml). Will continue with current nutrition plan of care.   Pt with deep pitting edema to BUE and moderate pitting edema to BLE per RN assessment.   Admit wt: 70 kg Current wt: 71.5 kg  UOP: 2370ml x24 hours L Hip/Thigh drain: 159ml output x24 hours  Labs:CBGs 141-222 Medications: ss novolog Q4H, 22 units lantus BID   Diet Order:   Diet Order            Diet NPO time specified  Diet effective now                 EDUCATION NEEDS:   Not appropriate for education at this time  Skin:  Skin Assessment: Skin Integrity Issues: Skin Integrity Issues:: Stage II,Other (Comment) Stage II: bilateral buttocks Other: pressure injury to penis (not staged)  Last BM:  12/14 type 5/6  Height:   Ht Readings from Last 1 Encounters:  10/22/2020 6' (1.829 m)    Weight:   Wt Readings from Last 1 Encounters:  11/02/20 71.5 kg   BMI:  Body mass index is 21.38 kg/m.  Estimated Nutritional Needs:   Kcal:  1900-2100  Protein:  95-115 grams  Fluid:  >/= 2.0 L   Larkin Ina, MS,  RD, LDN RD pager number and weekend/on-call pager number located in [place].

## 2020-11-04 LAB — PHOSPHORUS: Phosphorus: 2.1 mg/dL — ABNORMAL LOW (ref 2.5–4.6)

## 2020-11-04 LAB — CBC WITH DIFFERENTIAL/PLATELET
Abs Immature Granulocytes: 0.03 10*3/uL (ref 0.00–0.07)
Basophils Absolute: 0 10*3/uL (ref 0.0–0.1)
Basophils Relative: 1 %
Eosinophils Absolute: 0.3 10*3/uL (ref 0.0–0.5)
Eosinophils Relative: 5 %
HCT: 24.6 % — ABNORMAL LOW (ref 39.0–52.0)
Hemoglobin: 8.3 g/dL — ABNORMAL LOW (ref 13.0–17.0)
Immature Granulocytes: 1 %
Lymphocytes Relative: 20 %
Lymphs Abs: 1.2 10*3/uL (ref 0.7–4.0)
MCH: 26 pg (ref 26.0–34.0)
MCHC: 33.7 g/dL (ref 30.0–36.0)
MCV: 77.1 fL — ABNORMAL LOW (ref 80.0–100.0)
Monocytes Absolute: 0.4 10*3/uL (ref 0.1–1.0)
Monocytes Relative: 7 %
Neutro Abs: 4.1 10*3/uL (ref 1.7–7.7)
Neutrophils Relative %: 66 %
Platelets: 432 10*3/uL — ABNORMAL HIGH (ref 150–400)
RBC: 3.19 MIL/uL — ABNORMAL LOW (ref 4.22–5.81)
RDW: 19.4 % — ABNORMAL HIGH (ref 11.5–15.5)
WBC: 6.2 10*3/uL (ref 4.0–10.5)
nRBC: 0 % (ref 0.0–0.2)

## 2020-11-04 LAB — GLUCOSE, CAPILLARY
Glucose-Capillary: 129 mg/dL — ABNORMAL HIGH (ref 70–99)
Glucose-Capillary: 131 mg/dL — ABNORMAL HIGH (ref 70–99)
Glucose-Capillary: 141 mg/dL — ABNORMAL HIGH (ref 70–99)
Glucose-Capillary: 152 mg/dL — ABNORMAL HIGH (ref 70–99)
Glucose-Capillary: 166 mg/dL — ABNORMAL HIGH (ref 70–99)
Glucose-Capillary: 177 mg/dL — ABNORMAL HIGH (ref 70–99)
Glucose-Capillary: 276 mg/dL — ABNORMAL HIGH (ref 70–99)

## 2020-11-04 LAB — MAGNESIUM: Magnesium: 1.7 mg/dL (ref 1.7–2.4)

## 2020-11-04 LAB — COMPREHENSIVE METABOLIC PANEL
ALT: 8 U/L (ref 0–44)
AST: 22 U/L (ref 15–41)
Albumin: 1 g/dL — ABNORMAL LOW (ref 3.5–5.0)
Alkaline Phosphatase: 190 U/L — ABNORMAL HIGH (ref 38–126)
Anion gap: 5 (ref 5–15)
BUN: 12 mg/dL (ref 8–23)
CO2: 26 mmol/L (ref 22–32)
Calcium: 7.5 mg/dL — ABNORMAL LOW (ref 8.9–10.3)
Chloride: 106 mmol/L (ref 98–111)
Creatinine, Ser: 0.68 mg/dL (ref 0.61–1.24)
GFR, Estimated: >60 mL/min (ref >60)
Glucose, Bld: 234 mg/dL — ABNORMAL HIGH (ref 70–99)
Potassium: 3.8 mmol/L (ref 3.5–5.1)
Sodium: 137 mmol/L (ref 135–145)
Total Bilirubin: 0.6 mg/dL (ref 0.3–1.2)
Total Protein: 6.9 g/dL (ref 6.5–8.1)

## 2020-11-04 MED ORDER — MAGNESIUM SULFATE 2 GM/50ML IV SOLN
2.0000 g | Freq: Once | INTRAVENOUS | Status: AC
  Administered 2020-11-04: 2 g via INTRAVENOUS
  Filled 2020-11-04: qty 50

## 2020-11-04 MED ORDER — POTASSIUM PHOSPHATES 15 MMOLE/5ML IV SOLN
20.0000 mmol | Freq: Once | INTRAVENOUS | Status: AC
  Administered 2020-11-04: 20 mmol via INTRAVENOUS
  Filled 2020-11-04: qty 6.67

## 2020-11-04 NOTE — Progress Notes (Signed)
PROGRESS NOTE    Marcus Downs.  KZL:[id number] DOB: 07/23/1941 DOA: 10/19/2020 PCP: [name], MD    Brief Narrative:  79 year old gentleman with history of vascular dementia, previous CVA with residual left upper extremity weakness, essential hypertension, type 2 diabetes, dysphagia and hyperlipidemia, chronic urinary retention with indwelling Foley catheter secondary to BPH and recent left femoral neck fracture status post left hemiarthroplasty on 9/21 presented back from nursing home with altered mental status, agitation and AKI with electrolyte abnormalities.  He was found to have abscess surrounding the arthroplasty hardware.  Not a surgical candidate. Remains in poor overall clinical status.  Treated for MSSA infection.   Assessment & Plan:   Principal Problem:   MSSA bacteremia Active Problems:   Protein-calorie malnutrition, severe (HCC)   Encounter for nasogastric (NG) tube placement   Severe sepsis (HCC)   Abscess of left hip   Hypernatremia   Abscess   Acute kidney injury (nontraumatic) (HCC)   Dehydration   Elevated liver function tests   Prosthetic hip infection (HCC)   Hyperkalemia  MSSA bacteremia/sepsis secondary to MSSA left hip abscess with septic shock: Treated with vasopressors and weaned off. Blood cultures 12/4, MSSA bacteremia Hip abscess 12/4, MSSA Urine culture 12/4, MSSA Repeat blood cultures negative.  Echocardiogram without any vegetation.  Not a good candidate for operative removal/spacer or multistage surgery. IR guided abscess drain, continues to drain.  Overall with poor clinical improvement. Currently remains on cefazolin and rifampicin.  Total 6 weeks of antibiotics planned. Antibiotic end date 12/03/2020. Patient is not appropriately improving, unsure whether he will be able to go to skilled nursing facility.  SVT and sinus tachycardia: Improving.  Treated with Cardizem.  Now on Cardizem and metoprolol and acceptable.  TSH  1.6.  Acute metabolic encephalopathy in the setting of history of stroke and underlying vascular dementia: Gradual worsening of the mental status.  Repeat MRI with no evidence of acute process.  Does have history of vascular dementia.   Remains on Dobbhoff tube feeding.  Not awake enough to have a swallow evaluation. Followed by palliative.  Hospice appropriate if agreed by family. Family continues to have difficult time making decisions, they would like to see how he does. Speech recommended dysphagia 1 diet, however patient is somnolent.  Nursing will help eating and document.  Acute kidney injury with hypernatremia: Sodium level improved with increasing free water flush.  Normalized.  Type 2 diabetes, uncontrolled with hyperglycemia: A1c 8.1.  Appropriate on current doses of Lantus and sliding scale insulin.  Dysphagia: Currently getting feeding through NG tube.  Remains with poor mentation.  Try supervised oral trial.  Speech therapy following.  Not a good candidate for PEG tube with poor clinical status and short span of life.  Not desired to have PEG tube feeding. We can see next few days and continue to have palliative discussion. Pressure Injury 08/07/20 Buttocks Right;Medial;Left Stage 2 -  Partial thickness loss of dermis presenting as a shallow open injury with a red, pink wound bed without slough. (Active)  08/07/20 1914  Location: Buttocks  Location Orientation: Right;Medial;Left  Staging: Stage 2 -  Partial thickness loss of dermis presenting as a shallow open injury with a red, pink wound bed without slough.  Wound Description (Comments):   Present on Admission: Yes     Pressure Injury 11/02/20 Penis (Active)  11/02/20 0344  Location: Penis (distal meatus)  Location Orientation:   Staging:   Wound Description (Comments):   Present on Admission:  Severe protein calorie malnutrition: Augmented with tube feeding.  Continue. Nutrition Status: Nutrition Problem: Severe  Malnutrition Etiology: chronic illness (dementia) Signs/Symptoms: severe fat depletion,severe muscle depletion,percent weight loss (15.4% weight loss in 9 months) Percent weight loss: 15.4 % (less than 9 months) Interventions: Tube feeding,Prostat,MVI     DVT prophylaxis: heparin injection 5,000 Units Start: 10/22/20 1400 SCDs Start: 10/30/2020 1213   Code Status: DNR Family Communication: Patient's son updated on the phone 12/17 Disposition Plan: Status is: Inpatient  Remains inpatient appropriate because:Altered mental status and Inpatient level of care appropriate due to severity of illness   Dispo:  Patient From: Skilled Nursing Facility  Planned Disposition: Skilled Nursing Facility  Expected discharge date: 11/06/2020  Medically stable for discharge: No          Consultants:   Orthopedics  Interventional radiology  Palliative medicine  ID  Procedures:   Left hip drain  Antimicrobials:  Antibiotics Given (last 72 hours)    Date/Time Action Medication Dose Rate   11/01/20 1402 New Bag/Given   ceFAZolin (ANCEF) IVPB 2g/100 mL premix 2 g 200 mL/hr   11/01/20 2146 New Bag/Given   rifampin (RIFADIN) 300 mg in sodium chloride 0.9 % 100 mL IVPB 300 mg 200 mL/hr   11/01/20 2239 New Bag/Given   ceFAZolin (ANCEF) IVPB 2g/100 mL premix 2 g 200 mL/hr   11/02/20 0631 New Bag/Given   ceFAZolin (ANCEF) IVPB 2g/100 mL premix 2 g 200 mL/hr   11/02/20 1000 New Bag/Given   rifampin (RIFADIN) 300 mg in sodium chloride 0.9 % 100 mL IVPB 300 mg 200 mL/hr   11/02/20 1253 New Bag/Given   ceFAZolin (ANCEF) IVPB 2g/100 mL premix 2 g 200 mL/hr   11/02/20 2114 New Bag/Given   rifampin (RIFADIN) 300 mg in sodium chloride 0.9 % 100 mL IVPB 300 mg 200 mL/hr   11/02/20 2309 New Bag/Given  [medication not given at scheduled time due to unavailability. Pharmacy re-send.]   ceFAZolin (ANCEF) IVPB 2g/100 mL premix 2 g 200 mL/hr   11/03/20 0524 New Bag/Given   ceFAZolin (ANCEF) IVPB  2g/100 mL premix 2 g 200 mL/hr   11/03/20 1007 New Bag/Given   rifampin (RIFADIN) 300 mg in sodium chloride 0.9 % 100 mL IVPB 300 mg 200 mL/hr   11/03/20 1314 New Bag/Given   ceFAZolin (ANCEF) IVPB 2g/100 mL premix 2 g 200 mL/hr   11/03/20 2145 New Bag/Given   rifampin (RIFADIN) 300 mg in sodium chloride 0.9 % 100 mL IVPB 300 mg 200 mL/hr   11/03/20 2146 New Bag/Given   ceFAZolin (ANCEF) IVPB 2g/100 mL premix 2 g 200 mL/hr   11/04/20 0555 New Bag/Given   ceFAZolin (ANCEF) IVPB 2g/100 mL premix 2 g 200 mL/hr   11/04/20 0908 New Bag/Given   rifampin (RIFADIN) 300 mg in sodium chloride 0.9 % 100 mL IVPB 300 mg 200 mL/hr         Subjective: Patient seen and examined.  No overnight events.  Remains about the same.  He is hardly communicating.  Lethargic and sleepy all the time.  We are not sure he can even be alert to eat.  Objective: Vitals:   11/03/20 0508 11/03/20 1002 11/03/20 1950 11/04/20 0854  BP: 135/64 134/82 (!) 152/77 (!) 153/81  Pulse: 100 (!) 113 100 (!) 108  Resp: 20 20 16 20   Temp: 98 F (36.7 C) 97.9 F (36.6 C) 98 F (36.7 C) 98.1 F (36.7 C)  TempSrc: Oral Axillary  Axillary  SpO2: 99% 100% 100%  100%  Weight:      Height:        Intake/Output Summary (Last 24 hours) at 11/04/2020 1100 Last data filed at 11/04/2020 0656 Gross per 24 hour  Intake --  Output 3250 ml  Net -3250 ml   Filed Weights   10/31/20 0131 11/01/20 0347 11/02/20 0825  Weight: 75.2 kg 75 kg 71.5 kg    Examination:  General exam: Sick looking gentleman who is very frail and debilitated.  Sleepy.  Wakes up on his stimulation and follows simple commands.  Tries to answer some questions, however incomprehensible.  Respiratory system: Clear to auscultation. Respiratory effort normal.  Mostly conducted airway sounds. Cardiovascular system: S1 & S2 heard, RRR Gastrointestinal system: Soft.  Not distended.  Bowel sounds present.   Central nervous system: Sleepy, lethargic.   Psychiatry:  Judgement and insight appear compromised. Left hip with JP drain, serosanguineous drainage present.    Data Reviewed: I have personally reviewed following labs and imaging studies  CBC: Recent Labs  Lab 10/30/20 0301 11/02/20 0025 11/04/20 0150  WBC 9.9 9.6 6.2  NEUTROABS  --   --  4.1  HGB 8.8* 8.9* 8.3*  HCT 25.2* 26.4* 24.6*  MCV 75.4* 76.7* 77.1*  PLT 286 351 432*   Basic Metabolic Panel: Recent Labs  Lab 10/30/20 0301 10/31/20 0333 11/01/20 0601 11/02/20 0025 11/04/20 0150  NA 139 136 135 136 137  K 4.6 4.4 4.5 4.8 3.8  CL 106 104 101 104 106  CO2 24 23 26 25 26   GLUCOSE 147* 238* 241* 206* 234*  BUN 16 18 18 18 12   CREATININE 0.81 0.96 0.90 0.95 0.68  CALCIUM 7.6* 7.4* 7.7* 7.6* 7.5*  MG  --   --   --   --  1.7  PHOS  --   --   --   --  2.1*   GFR: Estimated Creatinine Clearance: 77 mL/min (by C-G formula based on SCr of 0.68 mg/dL). Liver Function Tests: Recent Labs  Lab 11/02/20 0025 11/04/20 0150  AST 22 22  ALT 10 8  ALKPHOS 192* 190*  BILITOT 0.7 0.6  PROT 6.9 6.9  ALBUMIN 1.0* 1.0*   No results for input(s): LIPASE, AMYLASE in the last 168 hours. No results for input(s): AMMONIA in the last 168 hours. Coagulation Profile: No results for input(s): INR, PROTIME in the last 168 hours. Cardiac Enzymes: No results for input(s): CKTOTAL, CKMB, CKMBINDEX, TROPONINI in the last 168 hours. BNP (last 3 results) No results for input(s): PROBNP in the last 8760 hours. HbA1C: No results for input(s): HGBA1C in the last 72 hours. CBG: Recent Labs  Lab 11/03/20 1620 11/03/20 1950 11/04/20 0004 11/04/20 0351 11/04/20 0801  GLUCAP 207* 228* 276* 152* 131*   Lipid Profile: No results for input(s): CHOL, HDL, LDLCALC, TRIG, CHOLHDL, LDLDIRECT in the last 72 hours. Thyroid Function Tests: No results for input(s): TSH, T4TOTAL, FREET4, T3FREE, THYROIDAB in the last 72 hours. Anemia Panel: No results for input(s): VITAMINB12, FOLATE, FERRITIN, TIBC,  IRON, RETICCTPCT in the last 72 hours. Sepsis Labs: No results for input(s): PROCALCITON, LATICACIDVEN in the last 168 hours.  Recent Results (from the past 240 hour(s))  Culture, Urine     Status: Abnormal   Collection Time: 11/02/20  5:08 AM   Specimen: Urine, Random  Result Value Ref Range Status   Specimen Description URINE, RANDOM  Final   Special Requests   Final    NONE Performed at [facility], [address] [place], [place] [address]    Culture 40,000 COLONIES/mL YEAST (A)  Final   Report Status 11/03/2020 FINAL  Final         Radiology Studies: No results found.      Scheduled Meds: . brimonidine  1 drop Both Eyes TID  . Chlorhexidine Gluconate Cloth  6 each Topical Daily  . diltiazem  60 mg Per Tube Q8H  . feeding supplement (PROSource TF)  45 mL Per Tube Daily  . free water  300 mL Per Tube Q4H  . heparin injection (subcutaneous)  5,000 Units Subcutaneous Q8H  . insulin aspart  0-15 Units Subcutaneous Q4H  . insulin glargine  22 Units Subcutaneous BID  . latanoprost  1 drop Both Eyes QHS  . metoprolol tartrate  50 mg Per Tube BID  . mupirocin ointment   Topical BID  . pantoprazole (PROTONIX) IV  40 mg Intravenous QHS  . sodium chloride flush  5 mL Intracatheter Q8H   Continuous Infusions: . sodium chloride 250 mL (11/01/20 2145)  .  ceFAZolin (ANCEF) IV 2 g (11/04/20 0555)  . feeding supplement (OSMOLITE 1.2 CAL) 1,000 mL (11/04/20 1045)  . potassium PHOSPHATE IVPB (in mmol) 20 mmol (11/04/20 0920)  . rifampin (RIFADIN) IVPB 300 mg (11/04/20 0908)     LOS: 14 days    Time spent: 30 minutes    Dorcas Carrow, MD [facility] Pager [phone]

## 2020-11-05 DIAGNOSIS — F0150 Vascular dementia without behavioral disturbance: Secondary | ICD-10-CM

## 2020-11-05 LAB — CBC WITH DIFFERENTIAL/PLATELET
Abs Immature Granulocytes: 0.03 10*3/uL (ref 0.00–0.07)
Basophils Absolute: 0 10*3/uL (ref 0.0–0.1)
Basophils Relative: 1 %
Eosinophils Absolute: 0.4 10*3/uL (ref 0.0–0.5)
Eosinophils Relative: 6 %
HCT: 25.5 % — ABNORMAL LOW (ref 39.0–52.0)
Hemoglobin: 8.9 g/dL — ABNORMAL LOW (ref 13.0–17.0)
Immature Granulocytes: 1 %
Lymphocytes Relative: 19 %
Lymphs Abs: 1.2 10*3/uL (ref 0.7–4.0)
MCH: 27.1 pg (ref 26.0–34.0)
MCHC: 34.9 g/dL (ref 30.0–36.0)
MCV: 77.7 fL — ABNORMAL LOW (ref 80.0–100.0)
Monocytes Absolute: 0.5 10*3/uL (ref 0.1–1.0)
Monocytes Relative: 8 %
Neutro Abs: 4.3 10*3/uL (ref 1.7–7.7)
Neutrophils Relative %: 65 %
Platelets: 345 10*3/uL (ref 150–400)
RBC: 3.28 MIL/uL — ABNORMAL LOW (ref 4.22–5.81)
RDW: 19.9 % — ABNORMAL HIGH (ref 11.5–15.5)
WBC: 6.4 10*3/uL (ref 4.0–10.5)
nRBC: 0 % (ref 0.0–0.2)

## 2020-11-05 LAB — COMPREHENSIVE METABOLIC PANEL
ALT: 10 U/L (ref 0–44)
AST: 24 U/L (ref 15–41)
Albumin: 1.1 g/dL — ABNORMAL LOW (ref 3.5–5.0)
Alkaline Phosphatase: 215 U/L — ABNORMAL HIGH (ref 38–126)
Anion gap: 10 (ref 5–15)
BUN: 13 mg/dL (ref 8–23)
CO2: 21 mmol/L — ABNORMAL LOW (ref 22–32)
Calcium: 7.3 mg/dL — ABNORMAL LOW (ref 8.9–10.3)
Chloride: 101 mmol/L (ref 98–111)
Creatinine, Ser: 0.7 mg/dL (ref 0.61–1.24)
GFR, Estimated: >60 mL/min (ref >60)
Glucose, Bld: 250 mg/dL — ABNORMAL HIGH (ref 70–99)
Potassium: 4.3 mmol/L (ref 3.5–5.1)
Sodium: 132 mmol/L — ABNORMAL LOW (ref 135–145)
Total Bilirubin: 0.5 mg/dL (ref 0.3–1.2)
Total Protein: 7 g/dL (ref 6.5–8.1)

## 2020-11-05 LAB — GLUCOSE, CAPILLARY
Glucose-Capillary: 194 mg/dL — ABNORMAL HIGH (ref 70–99)
Glucose-Capillary: 141 mg/dL — ABNORMAL HIGH (ref 70–99)
Glucose-Capillary: 159 mg/dL — ABNORMAL HIGH (ref 70–99)
Glucose-Capillary: 155 mg/dL — ABNORMAL HIGH (ref 70–99)
Glucose-Capillary: 100 mg/dL — ABNORMAL HIGH (ref 70–99)
Glucose-Capillary: 91 mg/dL (ref 70–99)

## 2020-11-05 MED ORDER — PANTOPRAZOLE SODIUM 40 MG PO PACK
40.0000 mg | PACK | Freq: Every day | ORAL | Status: DC
  Administered 2020-11-05 – 2020-11-07 (×3): 40 mg
  Filled 2020-11-05 (×3): qty 20

## 2020-11-05 NOTE — Progress Notes (Signed)
Patient able to tolerate eating approximately 15% of meal.

## 2020-11-05 NOTE — Plan of Care (Signed)

## 2020-11-05 NOTE — Progress Notes (Signed)
Daily Progress Note   Patient Name: Marcus Downs.       Date: 11/05/2020 DOB: May 21, 1941  Age: 79 y.o. MRN#: [id number] Attending Physician: Dorcas Carrow, MD Primary Care Physician: [name], MD Admit Date: 10/18/2020  Reason for Consultation/Follow-up:  To discuss complex medical decision making related to patient's goals of care Patient awake and alert.  Responds to me appropriately.   Unfortunately I can only understand 25% of what he says.   Subjective:  He does tell me that he's hungry and he wants to watch football.  I fed him applesauce (no coughing or difficulty.  Swallowed without queuing).  He ate half and then told me he was full.    Assessment: Patient comfortable, alert, no distress.  Able to swallow without difficulty.   Patient Profile/HPI:  79 y.o.malewith past medical history of stroke, vascular dementia, hypertension, hyperlipidemia, diabetes, GERD, dysphagia, chronic urinary retention with indwelling catheter due to BPH, L hemiarthroplasty 09/2021admitted on 12/4/2021from SNFwith[facility]I and sepsis.CT A/P found left hip abscess with MSSA bacteremia. Has Cortrak in place with ongoing decreased mental status. Overall prognosis poor. Palliative care requested to continue goals of care conversations with family.  Length of Stay: 15   Vital Signs: BP 135/69 (BP Location: Left Arm)   Pulse 98   Temp 98.6 F (37 C) (Axillary)   Resp 20   Ht 6' (1.829 m)   Wt 72.8 kg   SpO2 100%   BMI 21.77 kg/m  SpO2: SpO2: 100 % O2 Device: O2 Device: Room Air O2 Flow Rate:         Palliative Assessment/Data: 20-30%     Palliative Care Plan    Recommendations/Plan:  Continue current care.    I suggested to Dr. Jerral Ralph that we pause his tube feeds to  see if he would eat more on his own.  (would not pull Cor trak out yet)  Code Status:  DNR  Prognosis:   Unable to determine   Discharge Planning:  Skilled Nursing Facility for rehab with Palliative care service follow-up  Care plan was discussed with attending physician  Thank you for allowing the Palliative Medicine Team to assist in the care of this patient.  Total time spent:  25 min.     Greater than 50%  of this time was spent counseling  and coordinating care related to the above assessment and plan.  Norvel Richards, PA-C Palliative Medicine  Please contact Palliative MedicineTeam phone at [phone] for questions and concerns between 7 am - 7 pm.   Please see AMION for individual provider pager numbers.

## 2020-11-05 NOTE — Progress Notes (Signed)
PROGRESS NOTE    Marcus Downs.  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  He was found to have abscess surrounding the arthroplasty hardware.  Not a surgical candidate. Remains in poor overall clinical status.  Treated for MSSA infection. Multiple conversations with family about palliative or hospice, family not ready yet.   Assessment & Plan:   Principal Problem:   MSSA bacteremia Active Problems:   Protein-calorie malnutrition, severe (HCC)   Encounter for nasogastric (NG) tube placement   Severe sepsis (HCC)   Abscess of left hip   Hypernatremia   Abscess   Acute kidney injury (nontraumatic) (HCC)   Dehydration   Elevated liver function tests   Prosthetic hip infection (HCC)   Hyperkalemia  MSSA bacteremia/sepsis secondary to MSSA left hip abscess with septic shock: Treated with vasopressors and weaned off. Blood cultures 12/4, MSSA bacteremia Hip abscess 12/4, MSSA Urine culture 12/4, MSSA Repeat blood cultures negative.  Echocardiogram without any vegetation.  Not a good candidate for operative removal/spacer or multistage surgery. IR guided abscess drain, continues to drain.  Overall with poor clinical improvement. Currently remains on cefazolin and rifampicin.  Total 6 weeks of antibiotics planned. Antibiotic end date 12/03/2020. Patient is not appropriately improving, unsure whether he will be able to go to skilled nursing facility.  SVT and sinus tachycardia: Improving.   Treated with Cardizem.  Now on Cardizem and metoprolol and acceptable.  TSH 1.6.  Acute metabolic encephalopathy in the setting of history of stroke and underlying vascular dementia: Gradual worsening of the mental status.  Repeat MRI with no evidence of acute process.  Does have history of vascular dementia.   Remains on Dobbhoff tube feeding.  Not awake enough to have a swallow evaluation. Followed by palliative.  Hospice appropriate if agreed by family. Family continues to have difficult time making decisions, they would like to see how he does. Speech recommended dysphagia 1 diet, however patient is somnolent.  Nursing will help eating and document.  Acute kidney injury with hypernatremia: Sodium level improved with increasing free water flush.  Normalized.  Type 2 diabetes, uncontrolled with hyperglycemia: A1c 8.1.  Appropriate on current doses of Lantus and sliding scale insulin.  Dysphagia: Currently getting feeding through NG tube.  Remains with poor mentation.  Try supervised oral trial.  Speech therapy following.  Not a good candidate for PEG tube with poor clinical status and short span of life.  Not desired to have PEG tube feeding. We can see next few days and continue to have palliative discussion. Pressure Injury 08/07/20 Buttocks Right;Medial;Left Stage 2 -  Partial thickness loss of dermis presenting as a shallow open injury with a red, pink wound bed without slough. (Active)  08/07/20 1914  Location: Buttocks  Location Orientation: Right;Medial;Left  Staging: Stage 2 -  Partial thickness loss of dermis presenting as a shallow open injury with a red, pink wound bed without slough.  Wound Description (Comments):   Present on Admission: Yes     Pressure Injury 11/02/20 Penis (Active)  11/02/20 0344  Location: Penis (distal meatus)  Location Orientation:  Staging:   Wound Description (Comments):   Present on Admission:    Severe protein calorie malnutrition: Augmented  with tube feeding.  Continue. Nutrition Status: Nutrition Problem: Severe Malnutrition Etiology: chronic illness (dementia) Signs/Symptoms: severe fat depletion,severe muscle depletion,percent weight loss (15.4% weight loss in 9 months) Percent weight loss: 15.4 % (less than 9 months) Interventions: Tube feeding,Prostat,MVI     DVT prophylaxis: heparin injection 5,000 Units Start: 10/22/20 1400 SCDs Start: 2020-10-29 1213   Code Status: DNR Family Communication: Patient's James called and updated. Disposition Plan: Status is: Inpatient  Remains inpatient appropriate because:Altered mental status and Inpatient level of care appropriate due to severity of illness   Dispo:  Patient From: Skilled Nursing Facility  Planned Disposition: Skilled Nursing Facility  Expected discharge date: 11/06/2020  Medically stable for discharge: No          Consultants:   Orthopedics  Interventional radiology  Palliative medicine  ID  Procedures:   Left hip drain  Antimicrobials:  Antibiotics Given (last 72 hours)    Date/Time Action Medication Dose Rate   11/02/20 1253 New Bag/Given   ceFAZolin (ANCEF) IVPB 2g/100 mL premix 2 g 200 mL/hr   11/02/20 2114 New Bag/Given   rifampin (RIFADIN) 300 mg in sodium chloride 0.9 % 100 mL IVPB 300 mg 200 mL/hr   11/02/20 2309 New Bag/Given  [medication not given at scheduled time due to unavailability. Pharmacy re-send.]   ceFAZolin (ANCEF) IVPB 2g/100 mL premix 2 g 200 mL/hr   11/03/20 0524 New Bag/Given   ceFAZolin (ANCEF) IVPB 2g/100 mL premix 2 g 200 mL/hr   11/03/20 1007 New Bag/Given   rifampin (RIFADIN) 300 mg in sodium chloride 0.9 % 100 mL IVPB 300 mg 200 mL/hr   11/03/20 1314 New Bag/Given   ceFAZolin (ANCEF) IVPB 2g/100 mL premix 2 g 200 mL/hr   11/03/20 2145 New Bag/Given   rifampin (RIFADIN) 300 mg in sodium chloride 0.9 % 100 mL IVPB 300 mg 200 mL/hr   11/03/20 2146 New Bag/Given   ceFAZolin (ANCEF) IVPB 2g/100 mL premix  2 g 200 mL/hr   11/04/20 0555 New Bag/Given   ceFAZolin (ANCEF) IVPB 2g/100 mL premix 2 g 200 mL/hr   11/04/20 0908 New Bag/Given   rifampin (RIFADIN) 300 mg in sodium chloride 0.9 % 100 mL IVPB 300 mg 200 mL/hr   11/04/20 1226 New Bag/Given   ceFAZolin (ANCEF) IVPB 2g/100 mL premix 2 g 200 mL/hr   11/04/20 2251 New Bag/Given   rifampin (RIFADIN) 300 mg in sodium chloride 0.9 % 100 mL IVPB 300 mg 200 mL/hr   11/04/20 2339 New Bag/Given  [justcame from pharmacy]   ceFAZolin (ANCEF) IVPB 2g/100 mL premix 2 g 200 mL/hr   11/05/20 8338 New Bag/Given   ceFAZolin (ANCEF) IVPB 2g/100 mL premix 2 g 200 mL/hr   11/05/20 2505 New Bag/Given   rifampin (RIFADIN) 300 mg in sodium chloride 0.9 % 100 mL IVPB 300 mg 200 mL/hr         Subjective: Patient seen and examined.  No overnight events.  Looks uncomfortable on touching.  Does not respond.  Objective: Vitals:   11/05/20 0035 11/05/20 0040 11/05/20 0400 11/05/20 0500  BP: 127/78  135/69   Pulse: (!) 102 94 98   Resp: (!) 22 20 20    Temp: 99 F (37.2 C)  98.6 F (37 C)   TempSrc: Axillary  Axillary   SpO2: 100%  100%   Weight:    72.8 kg  Height:  Intake/Output Summary (Last 24 hours) at 11/05/2020 1136 Last data filed at 11/05/2020 1048 Gross per 24 hour  Intake 2639.81 ml  Output 3230 ml  Net -590.19 ml   Filed Weights   11/01/20 0347 11/02/20 0825 11/05/20 0500  Weight: 75 kg 71.5 kg 72.8 kg    Examination:  General exam: Sick looking gentleman who is very frail and debilitated.  Wakes up on his stimulation and does not follow commands.  Incomprehensible words on attempted examination. Respiratory system: Clear to auscultation. Respiratory effort normal.  Mostly conducted airway sounds. Cardiovascular system: S1 & S2 heard, RRR Gastrointestinal system: Soft.  Not distended.  Bowel sounds present.   Central nervous system: Sleepy, lethargic.   Patient has less movement on left side compared to right  side. Psychiatry: Judgement and insight appear compromised. Left hip with JP drain, serosanguineous drainage present. Tenderness on palpation.   Data Reviewed: I have personally reviewed following labs and imaging studies  CBC: Recent Labs  Lab 10/30/20 0301 11/02/20 0025 11/04/20 0150 11/05/20 0023  WBC 9.9 9.6 6.2 6.4  NEUTROABS  --   --  4.1 4.3  HGB 8.8* 8.9* 8.3* 8.9*  HCT 25.2* 26.4* 24.6* 25.5*  MCV 75.4* 76.7* 77.1* 77.7*  PLT 286 351 432* 345   Basic Metabolic Panel: Recent Labs  Lab 10/31/20 0333 11/01/20 0601 11/02/20 0025 11/04/20 0150 11/05/20 0023  NA 136 135 136 137 132*  K 4.4 4.5 4.8 3.8 4.3  CL 104 101 104 106 101  CO2 23 26 25 26  21*  GLUCOSE 238* 241* 206* 234* 250*  BUN 18 18 18 12 13   CREATININE 0.96 0.90 0.95 0.68 0.70  CALCIUM 7.4* 7.7* 7.6* 7.5* 7.3*  MG  --   --   --  1.7  --   PHOS  --   --   --  2.1*  --    GFR: Estimated Creatinine Clearance: 78.4 mL/min (by C-G formula based on SCr of 0.7 mg/dL). Liver Function Tests: Recent Labs  Lab 11/02/20 0025 11/04/20 0150 11/05/20 0023  AST 22 22 24   ALT 10 8 10   ALKPHOS 192* 190* 215*  BILITOT 0.7 0.6 0.5  PROT 6.9 6.9 7.0  ALBUMIN 1.0* 1.0* 1.1*   No results for input(s): LIPASE, AMYLASE in the last 168 hours. No results for input(s): AMMONIA in the last 168 hours. Coagulation Profile: No results for input(s): INR, PROTIME in the last 168 hours. Cardiac Enzymes: No results for input(s): CKTOTAL, CKMB, CKMBINDEX, TROPONINI in the last 168 hours. BNP (last 3 results) No results for input(s): PROBNP in the last 8760 hours. HbA1C: No results for input(s): HGBA1C in the last 72 hours. CBG: Recent Labs  Lab 11/04/20 1535 11/04/20 2025 11/04/20 2330 11/05/20 0330 11/05/20 0746  GLUCAP 166* 141* 177* 194* 141*   Lipid Profile: No results for input(s): CHOL, HDL, LDLCALC, TRIG, CHOLHDL, LDLDIRECT in the last 72 hours. Thyroid Function Tests: No results for input(s): TSH,  T4TOTAL, FREET4, T3FREE, THYROIDAB in the last 72 hours. Anemia Panel: No results for input(s): VITAMINB12, FOLATE, FERRITIN, TIBC, IRON, RETICCTPCT in the last 72 hours. Sepsis Labs: No results for input(s): PROCALCITON, LATICACIDVEN in the last 168 hours.  Recent Results (from the past 240 hour(s))  Culture, Urine     Status: Abnormal   Collection Time: 11/02/20  5:08 AM   Specimen: Urine, Random  Result Value Ref Range Status   Specimen Description URINE, RANDOM  Final   Special Requests   Final  NONE Performed at [facility], [address], [place], [place] [address]    Culture 40,000 COLONIES/mL YEAST (A)  Final   Report Status 11/03/2020 FINAL  Final         Radiology Studies: No results found.      Scheduled Meds: . brimonidine  1 drop Both Eyes TID  . Chlorhexidine Gluconate Cloth  6 each Topical Daily  . diltiazem  60 mg Per Tube Q8H  . feeding supplement (PROSource TF)  45 mL Per Tube Daily  . free water  300 mL Per Tube Q4H  . heparin injection (subcutaneous)  5,000 Units Subcutaneous Q8H  . insulin aspart  0-15 Units Subcutaneous Q4H  . insulin glargine  22 Units Subcutaneous BID  . latanoprost  1 drop Both Eyes QHS  . metoprolol tartrate  50 mg Per Tube BID  . mupirocin ointment   Topical BID  . pantoprazole sodium  40 mg Per Tube QHS  . sodium chloride flush  5 mL Intracatheter Q8H   Continuous Infusions: . sodium chloride 250 mL (11/01/20 2145)  .  ceFAZolin (ANCEF) IV 2 g (11/05/20 6203)  . feeding supplement (OSMOLITE 1.2 CAL) 65 mL/hr at 11/04/20 2006  . rifampin (RIFADIN) IVPB 200 mL/hr at 11/05/20 1048     LOS: 15 days    Time spent: 30 minutes    Dorcas Carrow, MD [facility] Pager [phone]

## 2020-11-06 LAB — CBC WITH DIFFERENTIAL/PLATELET
Abs Immature Granulocytes: 0.04 10*3/uL (ref 0.00–0.07)
Basophils Absolute: 0 10*3/uL (ref 0.0–0.1)
Basophils Relative: 1 %
Eosinophils Absolute: 0.4 10*3/uL (ref 0.0–0.5)
Eosinophils Relative: 6 %
HCT: 25.6 % — ABNORMAL LOW (ref 39.0–52.0)
Hemoglobin: 8.7 g/dL — ABNORMAL LOW (ref 13.0–17.0)
Immature Granulocytes: 1 %
Lymphocytes Relative: 26 %
Lymphs Abs: 1.8 10*3/uL (ref 0.7–4.0)
MCH: 26.4 pg (ref 26.0–34.0)
MCHC: 34 g/dL (ref 30.0–36.0)
MCV: 77.8 fL — ABNORMAL LOW (ref 80.0–100.0)
Monocytes Absolute: 0.8 10*3/uL (ref 0.1–1.0)
Monocytes Relative: 12 %
Neutro Abs: 3.7 10*3/uL (ref 1.7–7.7)
Neutrophils Relative %: 54 %
Platelets: 455 10*3/uL — ABNORMAL HIGH (ref 150–400)
RBC: 3.29 MIL/uL — ABNORMAL LOW (ref 4.22–5.81)
RDW: 19.7 % — ABNORMAL HIGH (ref 11.5–15.5)
WBC: 6.8 10*3/uL (ref 4.0–10.5)
nRBC: 0.3 % — ABNORMAL HIGH (ref 0.0–0.2)

## 2020-11-06 LAB — COMPREHENSIVE METABOLIC PANEL
ALT: 9 U/L (ref 0–44)
AST: 20 U/L (ref 15–41)
Albumin: 1.1 g/dL — ABNORMAL LOW (ref 3.5–5.0)
Alkaline Phosphatase: 242 U/L — ABNORMAL HIGH (ref 38–126)
Anion gap: 5 (ref 5–15)
BUN: 12 mg/dL (ref 8–23)
CO2: 26 mmol/L (ref 22–32)
Calcium: 7.6 mg/dL — ABNORMAL LOW (ref 8.9–10.3)
Chloride: 105 mmol/L (ref 98–111)
Creatinine, Ser: 0.71 mg/dL (ref 0.61–1.24)
GFR, Estimated: >60 mL/min (ref >60)
Glucose, Bld: 54 mg/dL — ABNORMAL LOW (ref 70–99)
Potassium: 3.9 mmol/L (ref 3.5–5.1)
Sodium: 136 mmol/L (ref 135–145)
Total Bilirubin: 0.6 mg/dL (ref 0.3–1.2)
Total Protein: 7 g/dL (ref 6.5–8.1)

## 2020-11-06 LAB — GLUCOSE, CAPILLARY
Glucose-Capillary: 29 mg/dL — CL (ref 70–99)
Glucose-Capillary: 85 mg/dL (ref 70–99)
Glucose-Capillary: 49 mg/dL — ABNORMAL LOW (ref 70–99)
Glucose-Capillary: 43 mg/dL — CL (ref 70–99)
Glucose-Capillary: 88 mg/dL (ref 70–99)
Glucose-Capillary: 78 mg/dL (ref 70–99)
Glucose-Capillary: 95 mg/dL (ref 70–99)
Glucose-Capillary: 146 mg/dL — ABNORMAL HIGH (ref 70–99)
Glucose-Capillary: 209 mg/dL — ABNORMAL HIGH (ref 70–99)

## 2020-11-06 MED ORDER — GLUCOSE 40 % PO GEL
ORAL | Status: AC
  Administered 2020-11-06: 37.5 g
  Filled 2020-11-06: qty 2

## 2020-11-06 MED ORDER — INSULIN ASPART 100 UNIT/ML ~~LOC~~ SOLN
0.0000 [IU] | Freq: Every day | SUBCUTANEOUS | Status: DC
  Administered 2020-11-08 – 2020-11-09 (×2): 4 [IU] via SUBCUTANEOUS

## 2020-11-06 MED ORDER — GLUCAGON HCL RDNA (DIAGNOSTIC) 1 MG IJ SOLR
INTRAMUSCULAR | Status: AC
  Filled 2020-11-06: qty 1

## 2020-11-06 MED ORDER — INSULIN GLARGINE 100 UNIT/ML ~~LOC~~ SOLN
8.0000 [IU] | Freq: Two times a day (BID) | SUBCUTANEOUS | Status: DC
  Filled 2020-11-06: qty 0.08

## 2020-11-06 MED ORDER — RIFAMPIN ORAL SUSPENSION 25 MG/ML
300.0000 mg | Freq: Two times a day (BID) | ORAL | Status: DC
  Administered 2020-11-06 – 2020-11-08 (×4): 300 mg
  Filled 2020-11-06 (×5): qty 12

## 2020-11-06 MED ORDER — INSULIN ASPART 100 UNIT/ML ~~LOC~~ SOLN
0.0000 [IU] | Freq: Three times a day (TID) | SUBCUTANEOUS | Status: DC
  Administered 2020-11-07 (×2): 2 [IU] via SUBCUTANEOUS
  Administered 2020-11-07 – 2020-11-08 (×2): 1 [IU] via SUBCUTANEOUS
  Administered 2020-11-08: 2 [IU] via SUBCUTANEOUS
  Administered 2020-11-08: 3 [IU] via SUBCUTANEOUS
  Administered 2020-11-09: 2 [IU] via SUBCUTANEOUS
  Administered 2020-11-09: 3 [IU] via SUBCUTANEOUS
  Administered 2020-11-09 – 2020-11-10 (×3): 7 [IU] via SUBCUTANEOUS
  Administered 2020-11-10 – 2020-11-11 (×2): 3 [IU] via SUBCUTANEOUS
  Administered 2020-11-11 – 2020-11-12 (×4): 2 [IU] via SUBCUTANEOUS
  Administered 2020-11-12: 1 [IU] via SUBCUTANEOUS
  Administered 2020-11-13 (×2): 5 [IU] via SUBCUTANEOUS
  Administered 2020-11-13: 3 [IU] via SUBCUTANEOUS
  Administered 2020-11-14 – 2020-11-15 (×3): 1 [IU] via SUBCUTANEOUS
  Administered 2020-11-15: 18:00:00 5 [IU] via SUBCUTANEOUS
  Administered 2020-11-15: 13:00:00 2 [IU] via SUBCUTANEOUS
  Administered 2020-11-17: 1 [IU] via SUBCUTANEOUS
  Administered 2020-11-17 (×2): 2 [IU] via SUBCUTANEOUS

## 2020-11-06 MED ORDER — DEXTROSE 50 % IV SOLN: 25.0000 g | Freq: Once | INTRAVENOUS | Status: AC

## 2020-11-06 MED ORDER — DEXTROSE 50 % IV SOLN
INTRAVENOUS | Status: AC
  Administered 2020-11-06: 25 g via INTRAVENOUS
  Filled 2020-11-06: qty 50

## 2020-11-06 NOTE — Plan of Care (Signed)
  Problem: Education: Goal: Knowledge of General Education information will improve Description: Including pain rating scale, medication(s)/side effects and non-pharmacologic comfort measures Outcome: Progressing   Problem: Health Behavior/Discharge Planning: Goal: Ability to manage health-related needs will improve Outcome: Progressing   Problem: Clinical Measurements: Goal: Ability to maintain clinical measurements within normal limits will improve Outcome: Progressing Goal: Will remain free from infection Outcome: Progressing Goal: Diagnostic test results will improve Outcome: Progressing Goal: Respiratory complications will improve Outcome: Progressing Goal: Cardiovascular complication will be avoided Outcome: Progressing   Problem: Activity: Goal: Risk for activity intolerance will decrease Outcome: Progressing   Problem: Nutrition: Goal: Adequate nutrition will be maintained Outcome: Progressing   Problem: Coping: Goal: Level of anxiety will decrease Outcome: Progressing   Problem: Elimination: Goal: Will not experience complications related to bowel motility Outcome: Progressing Goal: Will not experience complications related to urinary retention Outcome: Progressing   Problem: Pain Managment: Goal: General experience of comfort will improve Outcome: Progressing   Problem: Safety: Goal: Ability to remain free from injury will improve Outcome: Progressing   Problem: Skin Integrity: Goal: Risk for impaired skin integrity will decrease Outcome: Progressing   Problem: Nutrition Goal: Patient maintains adequate hydration Outcome: Progressing Goal: Patient maintains weight Outcome: Progressing Goal: Patient/Family demonstrates understanding of diet Outcome: Progressing Goal: Patient/Family independently completes tube feeding Outcome: Progressing Goal: Patient will have no more than 5 lb weight change during LOS Outcome: Progressing Goal: Patient will  utilize adaptive techniques to administer nutrition Outcome: Progressing Goal: Patient will verbalize dietary restrictions Outcome: Progressing   

## 2020-11-06 NOTE — Progress Notes (Signed)
Referring Physician(s): Dr Corrie Mckusick  Supervising Physician: Ruel Favors  Patient Status:  [facility] - In-pt  Chief Complaint:  Left hip abscess drain placement In IR 12/4  Subjective:  Left hip drain intact OP still significant Serous bloody color  Palliative seeing pt   Allergies: Tape  Medications: Prior to Admission medications   Medication Sig Start Date End Date Taking? Authorizing Provider  acetaminophen (TYLENOL) 325 MG tablet Take 650 mg by mouth every 6 (six) hours as needed for mild pain or fever. 08/14/20  Yes [provider]  Amino Acids-Protein Hydrolys (FEEDING SUPPLEMENT, PRO-STAT 64,) LIQD Take 30 mLs by mouth in the morning and at bedtime. For wound healing 08/15/20  Yes [provider]  aspirin 325 MG tablet Take 325 mg by mouth daily.   Yes [provider]  atorvastatin (LIPITOR) 40 MG tablet Take 1 tablet (40 mg total) by mouth daily. 08/14/20  Yes Rhetta Mura, MD  bisacodyl (DULCOLAX) 10 MG suppository Place 10 mg rectally as needed for moderate constipation.   Yes [provider]  brimonidine (ALPHAGAN P) 0.1 % SOLN Place 1 drop into both eyes 3 (three) times daily.   Yes [provider]  ferrous sulfate 325 (65 FE) MG tablet Take 325 mg by mouth daily.  08/31/18  Yes [name], MD  Insulin Aspart Prot & Aspart (NOVOLOG MIX 70/30 FLEXPEN [place]) Inject 14-18 Units into the skin See admin instructions. Inject 14 Units after breakfast,  Inject 18 units after lunch, inject 16 uints at dinner.   Yes [provider]  latanoprost (XALATAN) 0.005 % ophthalmic solution Place 1 drop into both eyes at bedtime.   Yes [provider]  magnesium hydroxide (MILK OF MAGNESIA) 400 MG/5ML suspension Take 30 mLs by mouth daily as needed for mild constipation.    Yes [provider]  metoprolol tartrate (LOPRESSOR) 25 MG tablet Take 1 tablet (25 mg total) by mouth 2 (two) times daily. 08/14/20   Yes Rhetta Mura, MD  MULTIPLE VITAMIN PO Take 1 tablet by mouth daily. W/ Minerals to promote wound healing   Yes [provider]  NON FORMULARY Medpass TID d/t weight loss. 09/13/20  Yes [provider]  potassium chloride (KLOR-CON) 10 MEQ tablet TAKE 3 TABLETS BY MOUTH DAILY Patient taking differently: 30 mEq daily.  09/11/20  Yes [name], MD  Sodium Phosphates (RA SALINE ENEMA RE) Place 1 enema rectally daily as needed (for constipation).   Yes [provider]  sodium chloride 0.9 % infusion Inject 1,000 mLs into the vein daily. 259ml/ 1 hour    [provider]  sulfamethoxazole-trimethoprim (BACTRIM DS) 800-160 MG tablet Take 1 tablet by mouth 2 (two) times daily. For 10 days    [provider]  sulfamethoxazole-trimethoprim (BACTRIM) 400-80 MG tablet Take 1 tablet by mouth 2 (two) times daily. For 10 days    [provider]     Vital Signs: BP 110/61 (BP Location: Right Arm)   Pulse (!) 107   Temp 97.8 F (36.6 C)   Resp 20   Ht 6' (1.829 m)   Wt 160 lb 7.9 oz (72.8 kg)   SpO2 100%   BMI 21.77 kg/m   Physical Exam Vitals reviewed.  Skin:    General: Skin is warm.     Comments: Site of drain is clean and dry NT no bleeding OP bloody 75 cc yesterday 20 cc in JP     Imaging: No results found.  Labs:  CBC: Recent Labs    11/02/20 0025 11/04/20 0150 11/05/20 0023 11/06/20 0146  WBC 9.6 6.2 6.4 6.8  HGB 8.9* 8.3* 8.9* 8.7*  HCT 26.4* 24.6* 25.5* 25.6*  PLT 351 432* 345 455*    COAGS: Recent Labs    Nov 18, 2020 0100  INR 1.4*  APTT 42*    BMP: Recent Labs    08/12/20 0540 08/13/20 0548 08/13/20 0548 08/21/20 0000 09/25/20 0000 11-18-2020 0100 11/02/20 0025 11/04/20 0150 11/05/20 0023 11/06/20 0146  NA 146* 142   < > 138 142   < > 136 137 132* 136  K 3.8 3.9  --  4.7 4.3   < > 4.8 3.8 4.3 3.9  CL 108 110  --  102 100   < > 104 106 101 105  CO2 26 22  --  25* 23*   < >  25 26 21* 26  GLUCOSE 221* 161*  --   --   --    < > 206* 234* 250* 54*  BUN 25* 24*   < > 10 9   < > 18 12 13 12   CALCIUM 7.9* 7.6*  --  8.5* 9.7   < > 7.6* 7.5* 7.3* 7.6*  CREATININE 1.34* 1.04   < > 0.8 1.1   < > 0.95 0.68 0.70 0.71  GFRNONAA 50* >60  --  83.93 62.96   < > >60 >60 >60 >60  GFRAA 58* >60  --  >90 72.97  --   --   --   --   --    < > = values in this interval not displayed.    LIVER FUNCTION TESTS: Recent Labs    11/02/20 0025 11/04/20 0150 11/05/20 0023 11/06/20 0146  BILITOT 0.7 0.6 0.5 0.6  AST 22 22 24 20   ALT 10 8 10 9   ALKPHOS 192* 190* 215* 242*  PROT 6.9 6.9 7.0 7.0  ALBUMIN 1.0* 1.0* 1.1* 1.1*    Assessment and Plan:  Left hip abscess drain intact Will follow OP still significant Consider re CT when OP less than 15 cc/24hrs   Electronically Signed: 11/08/20, PA-C 11/06/2020, 2:04 PM   I spent a total of 15 Minutes at the the patient's bedside AND on the patient's hospital floor or unit, greater than 50% of which was counseling/coordinating care for left hip abscess drain

## 2020-11-06 NOTE — Progress Notes (Signed)
Daily Progress Note   Patient Name: Marcus Downs.       Date: 11/06/2020 DOB: 02-19-1941  Age: 79 y.o. MRN#: [id number] Attending Physician: Dorcas Carrow, MD Primary Care Physician: [name], MD Admit Date: 11-04-2020  Reason for Consultation/Follow-up:  To discuss complex medical decision making related to patient's goals of care  Subjective: Visited patient at bedside.  He is awake, but lethargic.  He responds to my Hello with a "hello".  I ask if he is comfortable and he responds that he is.  I ask if he is hungry and he is not (SLP was here recently). He took a sip of water for me and after 4 swallows was able to get it down.  I spoke with [name] on the phone.  I explained that 2 days ago his father was started on a diet, and as of last evening we paused his tube feeds to see if he would eat more.  I expressed to [name] that he is able to eat, but does not have the desire to eat or drink much.  I'm worried that he will not take in enough food or liquid to sustain himself long term.    [name] asked about his father's presentation - was he alert and speaking?  I replied that he is very drowsy, but responsive and comfortable - and that is the way I see him remaining.  I do not believe he will get to the point of being alert and conversational for a few hours at a time.  This is the natural way of things with dementia - eventually people with dementia don't care about eating or drinking any more.  [name] indicated that he and the family had decided to give his father until after the New Year to see how he does.  I asked if they have considered Hospice at home or Hospice at a Hospice Facility.   [name] indicated that if he went with hospice he would need a hospice facility.  I expressed  that I don't feel he will eat/drink enough to sustain himself and that he will be a good candidate for a Hospice Facility.  [name] thanked me for seeing his father.   Assessment: Patient comfortable.  Tube feeds paused, cor trak left in place.   Patient likely not to eat/drink  enough to sustain.  Will ask for close tracking of his PO intake.   Patient Profile/HPI:  79 y.o.malewith past medical history of stroke, vascular dementia, hypertension, hyperlipidemia, diabetes, GERD, dysphagia, chronic urinary retention with indwelling catheter due to BPH, L hemiarthroplasty 09/2021[facility] and sepsis.CT A/P found left hip abscess with MSSA bacteremia. Has Cortrak in place with ongoing decreased mental status. Overall prognosis poor. Palliative care requested to continue goals of care conversations with family.     Length of Stay: 16   Vital Signs: BP 110/61 (BP Location: Right Arm)   Pulse (!) 107   Temp 97.8 F (36.6 C)   Resp 20   Ht 6' (1.829 m)   Wt 72.8 kg   SpO2 100%   BMI 21.77 kg/m  SpO2: SpO2: 100 % O2 Device: O2 Device: Room Air O2 Flow Rate:         Palliative Assessment/Data: 20%     Palliative Care Plan    Recommendations/Plan:  Close tracking of PO intake.  Feeding assistance.  Aspiration precautions.  PMT will continue to follow with you.  I will ask my colleague Yong Channel to see him when she returns to the hospital.  Code Status:  DNR  Prognosis:  Likely weeks given minimal PO intake in the setting of vascular dementia.  Discharge Planning:  To Be Determined  Care plan was discussed with son [name].  Thank you for allowing the Palliative Medicine Team to assist in the care of this patient.  Total time spent:  35 min.     Greater than 50%  of this time was spent counseling and coordinating care related to the above assessment and plan.  Norvel Richards, PA-C Palliative Medicine  Please contact  Palliative MedicineTeam phone at [phone] for questions and concerns between 7 am - 7 pm.   Please see AMION for individual provider pager numbers.

## 2020-11-06 NOTE — Progress Notes (Signed)
PROGRESS NOTE    Marcus Downs.  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  He was found to have abscess surrounding the arthroplasty hardware.  Not a surgical candidate. Remains in poor overall clinical status.  Treated for MSSA infection. Multiple conversations with family about palliative or hospice, family not ready yet. 12/20, patient is eating 25 to 50% now, core track to be removed today.   Assessment & Plan:   Principal Problem:   MSSA bacteremia Active Problems:   Protein-calorie malnutrition, severe (HCC)   Encounter for nasogastric (NG) tube placement   Severe sepsis (HCC)   Abscess of left hip   Hypernatremia   Abscess   Acute kidney injury (nontraumatic) (HCC)   Dehydration   Elevated liver function tests   Prosthetic hip infection (HCC)   Hyperkalemia  MSSA bacteremia/sepsis secondary to MSSA left hip abscess with septic shock: Treated with vasopressors and weaned off. Blood cultures 12/4, MSSA bacteremia Hip abscess 12/4, MSSA Urine culture 12/4, MSSA Repeat blood cultures negative.  Echocardiogram without any vegetation.  Not a good candidate for operative removal/spacer or multistage surgery. IR guided abscess drain, continues to drain.  Overall with poor clinical improvement. Currently remains on cefazolin and rifampicin.  Total 6 weeks of antibiotics planned. Antibiotic end date 12/03/2020. Will discharge to NH when oral intake established.  SVT and sinus  tachycardia: Improving.  Treated with Cardizem.  Now on Cardizem and metoprolol and acceptable.  TSH 1.6.  Acute metabolic encephalopathy in the setting of history of stroke and underlying vascular dementia: Gradual worsening of the mental status.  Repeat MRI with no evidence of acute process.  Does have history of vascular dementia.   Poor awakening and over all clinical status. Followed by palliative.  Hospice appropriate if agreed by family. Family continues to have difficult time making decisions, they would like to see how he does over next few weeks.  Tube feeding stopped, nursing reported taking 25 to 50% of morning meal. Discontinue NG tube and encourage oral feeding. If adequate oral feeding will discharge to long-term nursing home with IV antibiotics with outpatient palliative care follow-up. If does not do well, will continue hospice discussion with family.  Acute kidney injury with hypernatremia: Sodium level improved with increasing free water flush.  Normalized.  Type 2 diabetes, uncontrolled with hyperglycemia: A1c 8.1.  12/20, hypoglycemic episodes after stopping tube feeding and continuing on long-acting insulin. Decrease dose of long-acting insulin, sliding scale insulin to sensitive scale. If he is not eating well, he will not need that much insulin.  Dysphagia: Remains with poor mentation.  Try supervised oral trial.  Speech therapy following.  Not a good candidate for PEG tube with poor clinical status and short span of life.  Not desired to have PEG tube feeding. We can see next few days and continue to have palliative discussion. Pressure Injury 08/07/20 Buttocks Right;Medial;Left Stage 2 -  Partial thickness loss of dermis presenting as a shallow open injury with a red, pink wound bed without slough. (Active)  08/07/20 1914  Location: Buttocks  Location Orientation: Right;Medial;Left  Staging:  Stage 2 -  Partial thickness loss of dermis presenting as a shallow open  injury with a red, pink wound bed without slough.  Wound Description (Comments):   Present on Admission: Yes     Pressure Injury 11/02/20 Penis (Active)  11/02/20 0344  Location: Penis (distal meatus)  Location Orientation:   Staging:   Wound Description (Comments):   Present on Admission:    Severe protein calorie malnutrition: Augmented with tube feeding.  Continue. Nutrition Status: Nutrition Problem: Severe Malnutrition Etiology: chronic illness (dementia) Signs/Symptoms: severe fat depletion,severe muscle depletion,percent weight loss (15.4% weight loss in 9 months) Percent weight loss: 15.4 % (less than 9 months) Interventions: Tube feeding,Prostat,MVI     DVT prophylaxis: heparin injection 5,000 Units Start: 10/22/20 1400 SCDs Start: 10/23/2020 1213   Code Status: DNR Family Communication: Patient's James called and updated 12/19. Disposition Plan: Status is: Inpatient  Remains inpatient appropriate because:Altered mental status and Inpatient level of care appropriate due to severity of illness   Dispo:  Patient From: Skilled Nursing Facility  Planned Disposition: Skilled Nursing Facility  Expected discharge date: Unknown. Needs to eat.  Medically stable for discharge: No          Consultants:   Orthopedics  Interventional radiology  Palliative medicine  ID  Procedures:   Left hip drain  Antimicrobials:  Antibiotics Given (last 72 hours)    Date/Time Action Medication Dose Rate   11/03/20 1314 New Bag/Given   ceFAZolin (ANCEF) IVPB 2g/100 mL premix 2 g 200 mL/hr   11/03/20 2145 New Bag/Given   rifampin (RIFADIN) 300 mg in sodium chloride 0.9 % 100 mL IVPB 300 mg 200 mL/hr   11/03/20 2146 New Bag/Given   ceFAZolin (ANCEF) IVPB 2g/100 mL premix 2 g 200 mL/hr   11/04/20 0555 New Bag/Given   ceFAZolin (ANCEF) IVPB 2g/100 mL premix 2 g 200 mL/hr   11/04/20 0908 New Bag/Given   rifampin (RIFADIN) 300 mg in sodium chloride 0.9 % 100 mL IVPB 300  mg 200 mL/hr   11/04/20 1226 New Bag/Given   ceFAZolin (ANCEF) IVPB 2g/100 mL premix 2 g 200 mL/hr   11/04/20 2251 New Bag/Given   rifampin (RIFADIN) 300 mg in sodium chloride 0.9 % 100 mL IVPB 300 mg 200 mL/hr   11/04/20 2339 New Bag/Given  [justcame from pharmacy]   ceFAZolin (ANCEF) IVPB 2g/100 mL premix 2 g 200 mL/hr   11/05/20 1062 New Bag/Given   ceFAZolin (ANCEF) IVPB 2g/100 mL premix 2 g 200 mL/hr   11/05/20 6948 New Bag/Given   rifampin (RIFADIN) 300 mg in sodium chloride 0.9 % 100 mL IVPB 300 mg 200 mL/hr   11/05/20 1311 New Bag/Given   ceFAZolin (ANCEF) IVPB 2g/100 mL premix 2 g 200 mL/hr   11/05/20 2132 New Bag/Given   rifampin (RIFADIN) 300 mg in sodium chloride 0.9 % 100 mL IVPB 300 mg 200 mL/hr   11/05/20 2228 New Bag/Given   ceFAZolin (ANCEF) IVPB 2g/100 mL premix 2 g 200 mL/hr   11/06/20 5462 New Bag/Given   ceFAZolin (ANCEF) IVPB 2g/100 mL premix 2 g 200 mL/hr   11/06/20 7035 New Bag/Given   rifampin (RIFADIN) 300 mg in sodium chloride 0.9 % 100 mL IVPB 300 mg 200 mL/hr         Subjective: Patient seen and examined. He replied however when asked how is he doing. Today he was able to have some conversation. He denies any pain. Looks very lethargic and sleepy. No other overnight events.  Objective: Vitals:  11/05/20 1917 11/05/20 2000 11/05/20 2233 11/06/20 0418  BP: 140/86  104/60 110/61  Pulse: (!) 113  99 (!) 107  Resp: 20  20 20   Temp: 98 F (36.7 C)  99.8 F (37.7 C) 97.8 F (36.6 C)  TempSrc:      SpO2: 100% 100% 100% 100%  Weight:      Height:        Intake/Output Summary (Last 24 hours) at 11/06/2020 1215 Last data filed at 11/06/2020 1152 Gross per 24 hour  Intake 1005.14 ml  Output 2450 ml  Net -1444.86 ml   Filed Weights   11/01/20 0347 11/02/20 0825 11/05/20 0500  Weight: 75 kg 71.5 kg 72.8 kg    Examination:  General exam: Sick looking gentleman who is very frail and debilitated.  Wakes up on voice, answers few simple  questions. Respiratory system: Clear to auscultation. Respiratory effort normal.  Mostly conducted airway sounds. Cardiovascular system: S1 & S2 heard, RRR Gastrointestinal system: Soft.  Not distended.  Bowel sounds present.   Central nervous system: Mostly sleepy. Left hemiplegic. Foley catheter with clear urine. Psychiatry: Judgement and insight appear compromised. Left hip with JP drain, serosanguineous drainage present.    Data Reviewed: I have personally reviewed following labs and imaging studies  CBC: Recent Labs  Lab 11/02/20 0025 11/04/20 0150 11/05/20 0023 11/06/20 0146  WBC 9.6 6.2 6.4 6.8  NEUTROABS  --  4.1 4.3 3.7  HGB 8.9* 8.3* 8.9* 8.7*  HCT 26.4* 24.6* 25.5* 25.6*  MCV 76.7* 77.1* 77.7* 77.8*  PLT 351 432* 345 455*   Basic Metabolic Panel: Recent Labs  Lab 11/01/20 0601 11/02/20 0025 11/04/20 0150 11/05/20 0023 11/06/20 0146  NA 135 136 137 132* 136  K 4.5 4.8 3.8 4.3 3.9  CL 101 104 106 101 105  CO2 26 25 26  21* 26  GLUCOSE 241* 206* 234* 250* 54*  BUN 18 18 12 13 12   CREATININE 0.90 0.95 0.68 0.70 0.71  CALCIUM 7.7* 7.6* 7.5* 7.3* 7.6*  MG  --   --  1.7  --   --   PHOS  --   --  2.1*  --   --    GFR: Estimated Creatinine Clearance: 78.4 mL/min (by C-G formula based on SCr of 0.71 mg/dL). Liver Function Tests: Recent Labs  Lab 11/02/20 0025 11/04/20 0150 11/05/20 0023 11/06/20 0146  AST 22 22 24 20   ALT 10 8 10 9   ALKPHOS 192* 190* 215* 242*  BILITOT 0.7 0.6 0.5 0.6  PROT 6.9 6.9 7.0 7.0  ALBUMIN 1.0* 1.0* 1.1* 1.1*   No results for input(s): LIPASE, AMYLASE in the last 168 hours. No results for input(s): AMMONIA in the last 168 hours. Coagulation Profile: No results for input(s): INR, PROTIME in the last 168 hours. Cardiac Enzymes: No results for input(s): CKTOTAL, CKMB, CKMBINDEX, TROPONINI in the last 168 hours. BNP (last 3 results) No results for input(s): PROBNP in the last 8760 hours. HbA1C: No results for input(s):  HGBA1C in the last 72 hours. CBG: Recent Labs  Lab 11/06/20 0415 11/06/20 0509 11/06/20 0739 11/06/20 0858 11/06/20 0937  GLUCAP 29* 85 49* 43* 88   Lipid Profile: No results for input(s): CHOL, HDL, LDLCALC, TRIG, CHOLHDL, LDLDIRECT in the last 72 hours. Thyroid Function Tests: No results for input(s): TSH, T4TOTAL, FREET4, T3FREE, THYROIDAB in the last 72 hours. Anemia Panel: No results for input(s): VITAMINB12, FOLATE, FERRITIN, TIBC, IRON, RETICCTPCT in the last 72 hours. Sepsis Labs: No results for  input(s): PROCALCITON, LATICACIDVEN in the last 168 hours.  Recent Results (from the past 240 hour(s))  Culture, Urine     Status: Abnormal   Collection Time: 11/02/20  5:08 AM   Specimen: Urine, Random  Result Value Ref Range Status   Specimen Description URINE, RANDOM  Final   Special Requests   Final    NONE Performed at [facility], [address], [place], [place] [address]    Culture 40,000 COLONIES/mL YEAST (A)  Final   Report Status 11/03/2020 FINAL  Final         Radiology Studies: No results found.      Scheduled Meds: . brimonidine  1 drop Both Eyes TID  . Chlorhexidine Gluconate Cloth  6 each Topical Daily  . diltiazem  60 mg Per Tube Q8H  . feeding supplement (PROSource TF)  45 mL Per Tube Daily  . free water  300 mL Per Tube Q4H  . heparin injection (subcutaneous)  5,000 Units Subcutaneous Q8H  . insulin aspart  0-5 Units Subcutaneous QHS  . insulin aspart  0-9 Units Subcutaneous TID WC  . latanoprost  1 drop Both Eyes QHS  . metoprolol tartrate  50 mg Per Tube BID  . mupirocin ointment   Topical BID  . pantoprazole sodium  40 mg Per Tube QHS  . rifampin  300 mg Per Tube Q12H  . sodium chloride flush  5 mL Intracatheter Q8H   Continuous Infusions: . sodium chloride 250 mL (11/01/20 2145)  .  ceFAZolin (ANCEF) IV Stopped (11/06/20 0534)     LOS: 16 days    Time spent: 30 minutes    Dorcas Carrow, MD [facility] Pager  [phone]

## 2020-11-06 NOTE — Progress Notes (Signed)
  Speech Language Pathology Treatment: Dysphagia  Patient Details Name: Marcus Downs. MRN: [id number] DOB: 24-Sep-1941 Today's Date: 11/06/2020 Time: 2423-5361 SLP Time Calculation (min) (ACUTE ONLY): 10 min  Assessment / Plan / Recommendation Clinical Impression  Pt was seen for dysphagia treatment. He was alert and cooperative during the session and RN reported that the pt has been consuming ~50% of meals without difficulty. No s/sx of aspiration were noted with solids or liquids. Pt exhibited prolonged, minimal mastication with trials of dysphagia 2 solids and frequently sucked solids prior to swallowing them. Mild-moderate oral residue was noted with this consistently, but this was cleared with a liquid wash. It is recommended that dysphagia 1 solids with thin liquids be continued and SLP will continue to follow pt.    HPI HPI: Patient is a 79 y.o. male with PMH: CVA with residual dysphagia (dysphagia 1 and thin liquids was recommened on 08/14/20) and hemiplegia, GERD, DM-2, HTN, HLD, weight loss, s/p hip hemiarthroplasty. He presented to hospital from SNF, s/p hip fracture and surgery 3 months ago and was found to be septic wih a UTI and hip abscess. SLP was following pt from 12/4-12/10 but services were discontinued since lethargy was consistency limiting his participation and it was requested that SLP be reconsulted if pt's status improved. Palliative care was consulted and prognosis judged to be poor "given vascular dementia and weakness he is at high risk for acute decline or death from aspiration or lack of PO intake." Pt was more during meeting with palliative care they therefore requested re-evaluation on 12/17.      SLP Plan  Continue with current plan of care       Recommendations  Diet recommendations: Dysphagia 1 (puree);Thin liquid Liquids provided via: Cup;Straw Medication Administration: Whole meds with puree Supervision: Staff to assist with self feeding Compensations:  Slow rate;Small sips/bites Postural Changes and/or Swallow Maneuvers: Seated upright 90 degrees                Oral Care Recommendations: Oral care BID Follow up Recommendations: Skilled Nursing facility SLP Visit Diagnosis: Dysphagia, unspecified (R13.10) Plan: Continue with current plan of care       Rontavious Albright I. Vear Clock, MS, CCC-SLP Acute Rehabilitation Services Office number [phone] Pager [phone]                Scheryl Marten 11/06/2020, 10:17 AM

## 2020-11-07 LAB — CBC WITH DIFFERENTIAL/PLATELET
Abs Immature Granulocytes: 0.03 10*3/uL (ref 0.00–0.07)
Basophils Absolute: 0.1 10*3/uL (ref 0.0–0.1)
Basophils Relative: 1 %
Eosinophils Absolute: 0.5 10*3/uL (ref 0.0–0.5)
Eosinophils Relative: 6 %
HCT: 28.5 % — ABNORMAL LOW (ref 39.0–52.0)
Hemoglobin: 9.5 g/dL — ABNORMAL LOW (ref 13.0–17.0)
Immature Granulocytes: 0 %
Lymphocytes Relative: 18 %
Lymphs Abs: 1.3 10*3/uL (ref 0.7–4.0)
MCH: 26.5 pg (ref 26.0–34.0)
MCHC: 33.3 g/dL (ref 30.0–36.0)
MCV: 79.6 fL — ABNORMAL LOW (ref 80.0–100.0)
Monocytes Absolute: 0.7 10*3/uL (ref 0.1–1.0)
Monocytes Relative: 9 %
Neutro Abs: 4.9 10*3/uL (ref 1.7–7.7)
Neutrophils Relative %: 66 %
Platelets: 499 10*3/uL — ABNORMAL HIGH (ref 150–400)
RBC: 3.58 MIL/uL — ABNORMAL LOW (ref 4.22–5.81)
RDW: 20.4 % — ABNORMAL HIGH (ref 11.5–15.5)
WBC: 7.4 10*3/uL (ref 4.0–10.5)
nRBC: 0 % (ref 0.0–0.2)

## 2020-11-07 LAB — GLUCOSE, CAPILLARY
Glucose-Capillary: 173 mg/dL — ABNORMAL HIGH (ref 70–99)
Glucose-Capillary: 163 mg/dL — ABNORMAL HIGH (ref 70–99)
Glucose-Capillary: 135 mg/dL — ABNORMAL HIGH (ref 70–99)
Glucose-Capillary: 171 mg/dL — ABNORMAL HIGH (ref 70–99)
Glucose-Capillary: 163 mg/dL — ABNORMAL HIGH (ref 70–99)

## 2020-11-07 MED ORDER — ADULT MULTIVITAMIN W/MINERALS CH
1.0000 | ORAL_TABLET | Freq: Every day | ORAL | Status: DC
  Administered 2020-11-07 – 2020-11-18 (×7): 1 via ORAL
  Filled 2020-11-07 (×12): qty 1

## 2020-11-07 MED ORDER — GLUCERNA SHAKE PO LIQD
237.0000 mL | Freq: Three times a day (TID) | ORAL | Status: DC
  Administered 2020-11-07 – 2020-11-10 (×8): 237 mL via ORAL

## 2020-11-07 NOTE — Progress Notes (Signed)
Nutrition Follow-up  DOCUMENTATION CODES:   Severe malnutrition in context of chronic illness  INTERVENTION:   48-hour calorie count per MD  Glucerna Shake po TID, each supplement provides 220 kcal and 10 grams of protein  Magic cup TID with meals, each supplement provides 290 kcal and 9 grams of protein  MVI daily  NUTRITION DIAGNOSIS:   Severe Malnutrition related to chronic illness (dementia) as evidenced by severe fat depletion,severe muscle depletion,percent weight loss (15.4% weight loss in 9 months).  ongoing  GOAL:   Patient will meet greater than or equal to 90% of their needs  progressing  MONITOR:   PO intake,Supplement acceptance,Diet advancement,Skin,Weight trends,Labs,I & O's  REASON FOR ASSESSMENT:   Consult Enteral/tube feeding initiation and management  ASSESSMENT:   Pt admitted with severe sepsis secondary to UTI, MSSA bacteremia/sepsis 2/2 MSSA L hip abscess with septic shock. PMH of HTN, vascular dementia, T2DM, GERD, dysphagia, chronic urinary retention with indwelling foley catheter, HLD, previous CVA with left hemiparesis, left femoral neck fracture s/p left hemiarthroplasty in September 2021.  12/04 - s/p CT guided abscess drain placement by IR, NG tube placed 12/06 - NG tube replaced with Cortrak, tip gastric 12/20 - Cortrak removed  Pt sleeping at time of RD visit and did not wake to RD voice. MD ordered calorie count to begin yesterday; however, no envelope was placed on the door. Meal completion for yesterday is charted as 35-50% completion. Discussed pt with MD. Per MD, if pt is able to establish adequate oral intake, pt will d/c to long-term nursing home with IV antibiotics with outpatient palliative care follow-up. Pt has declined Hospice at this time. Pt/family do not desire PEG tube. Will order oral nutrition supplements and follow-up with results of calorie count.   IR following pt. L hip drain intact.  Output from drain: x24  hours  UOP: x24 hours  Mild pitting edema noted to LUE and LLE; non-pitting edema noted to RLE.   Admit wt: 70 kg Current wt: 72.8 kg  Labs: CBGs 135-173 Medications: ss novolog TID w/ meals and bedtime  Diet Order:   Diet Order            DIET - DYS 1 Room service appropriate? Yes; Fluid consistency: Thin  Diet effective now                 EDUCATION NEEDS:   Not appropriate for education at this time  Skin:  Skin Assessment: Skin Integrity Issues: Skin Integrity Issues:: Stage II,Other (Comment) Stage II: bilateral buttocks Other: pressure injury to penis (not staged)  Last BM:  12/20 type 7  Height:   Ht Readings from Last 1 Encounters:  11/04/2020 6' (1.829 m)    Weight:   Wt Readings from Last 1 Encounters:  11/05/20 72.8 kg   BMI:  Body mass index is 21.77 kg/m.  Estimated Nutritional Needs:   Kcal:  1900-2100  Protein:  95-115 grams  Fluid:  >/= 2.0 L    Eugene Gavia, MS, RD, LDN RD pager number and weekend/on-call pager number located in [place].

## 2020-11-07 NOTE — Progress Notes (Addendum)
Daily Progress Note   Patient Name: Kdyn Vonbehren.       Date: 11/07/2020 DOB: [birth year]  Age: 79 y.o. MRN#: [id number] Attending Physician: Dorcas Carrow, MD Primary Care Physician: [name], MD Admit Date: 10/30/2020  Reason for Consultation/Follow-up: goals of care Per Epic patient eating 30 - 50% of meals.  Cor trak out.   Glucose is maintaining well.  Subjective: Spoke with son [name] on the phone and provided and update.  [name] was pleased to hear his father is eating some.  Explained he will probably go back to SNF tomorrow or the next day.  They will need to feed him carefully.   Explained I'm still very concerned about him for the long term (eating and drinking enough).    [name] understood and thanked me for calling.   Assessment: Patient improved but still very fragile.  Concerned for longer term dehydration and malnutrition.  Hopefully he will continue to build his eating.  Drain in hip for MSSA abscess and will remain on IV antibiotics after discharge.     Patient Profile/HPI:  79 y.o.malewith past medical history of stroke, vascular dementia, hypertension, hyperlipidemia, diabetes, GERD, dysphagia, chronic urinary retention with indwelling catheter due to BPH, L hemiarthroplasty 09/2021admitted on 12/4/2021from SNFwithUTI and sepsis.CT A/P found left hip abscess with MSSA bacteremia. Has Cortrak in place with ongoing decreased mental status. Overall prognosis poor. Palliative care requested to continue goals of care conversations with family.  Length of Stay: 17   Vital Signs: BP (!) 141/73 (BP Location: Left Arm)   Pulse 99   Temp 98 F (36.7 C)   Resp (!) 22   Ht 6' (1.829 m)   Wt 72.8 kg   SpO2 100%   BMI 21.77 kg/m  SpO2: SpO2: 100 % O2  Device: O2 Device: Room Air O2 Flow Rate:         Palliative Assessment/Data: 30%     Palliative Care Plan    Recommendations/Plan:  Careful feeding assistance   Aspiration precautions  Palliative care to follow at SNF please  Code Status:  DNR  Prognosis:   < 12 months frailty with MSSA bacteremia and abscess in left hip where there is hardware.  Not a surgical candidate.  Poor PO intake.  Advancing dementia.  Albumin < 1.0  Discharge Planning:  Skilled Nursing Facility for rehab with Palliative care service follow-up  Care plan was discussed with son [name].  Thank you for allowing the Palliative Medicine Team to assist in the care of this patient.  Total time spent:  25 min.     Greater than 50%  of this time was spent counseling and coordinating care related to the above assessment and plan.  Norvel Richards, PA-C Palliative Medicine  Please contact Palliative MedicineTeam phone at [phone] for questions and concerns between 7 am - 7 pm.   Please see AMION for individual provider pager numbers.

## 2020-11-07 NOTE — [place] FL2 (Signed)
[place] MEDICAID FL2 LEVEL OF CARE SCREENING TOOL     IDENTIFICATION  Patient Name: Marcus Downs. Birthdate: Jul 25, 1941 Sex: male Admission Date (Current Location): 10/27/2020  [facility] and [place] Number:  Producer, television/film/video and Address:  [facility] [place], [address], [place], [place] [address]      Provider Number: [id number]  Attending Physician Name and Address:  Dorcas Carrow, MD  Relative Name and Phone Number:  Yong, Grieser [phone]    Current Level of Care: Hospital Recommended Level of Care: Skilled Nursing Facility Prior Approval Number:    Date Approved/Denied:   PASRR Number: [id number]  Discharge Plan: SNF    Current Diagnoses: Patient Active Problem List   Diagnosis Date Noted  . Hyperkalemia   . Abscess   . Acute kidney injury (nontraumatic) (HCC)   . Dehydration   . Elevated liver function tests   . Prosthetic hip infection (HCC)   . MSSA bacteremia 10/23/2020  . Abscess of left hip   . Hypernatremia   . Severe sepsis (HCC) 11/12/2020  . Dysphagia as late effect of stroke 08/22/2020  . Urinary retention 08/22/2020  . Tachycardia   . Status post hip hemiarthroplasty 08/07/2020  . S/P hip hemiarthroplasty 08/07/2020  . Pressure injury of skin 08/07/2020  . Closed left hip fracture (HCC) 08/06/2020  . Elevated alkaline phosphatase level 11/01/2019  . Type 2 diabetes mellitus with vascular disease (HCC) 08/16/2019  . Diabetes mellitus with cataract (HCC) 08/05/2019  . Weight loss 04/30/2019  . Iron deficiency anemia 04/29/2018  . Pedal edema 07/11/2017  . Primary open angle glaucoma   . Encounter for nasogastric (NG) tube placement 02/20/2015  . Benign prostatic hyperplasia 02/20/2015  . Falls 11/08/2013  . Protein-calorie malnutrition, severe (HCC) 11/08/2013  . Medicare annual wellness visit, subsequent 05/25/2012  . 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 Pain due to  onychomycosis of toenails of both feet 09/10/2010  . Renal insufficiency 04/13/2009  . GERD 03/30/2009  . Controlled diabetes mellitus type 2 with complications (HCC) 05/04/2007  . Hyperlipidemia associated with type 2 diabetes mellitus (HCC) 05/04/2007  . Essential hypertension 05/04/2007  . SYMPTOM, INCONTINENCE, MIXED, URGE/STRESS 05/04/2007  . Ex-smoker 05/04/2007    Orientation RESPIRATION BLADDER Height & Weight     Self (self only)  Normal Incontinent,Indwelling catheter Weight: 72.8 kg Height:  6' (182.9 cm)  BEHAVIORAL SYMPTOMS/MOOD NEUROLOGICAL BOWEL NUTRITION STATUS   (n/a)  (n/a) Incontinent Diet  AMBULATORY STATUS COMMUNICATION OF NEEDS Skin   Total Care Verbally (slurred/ dysarthria) PU Stage and Appropriate Care   PU Stage 2 Dressing: Daily                   Personal Care Assistance Level of Assistance  Bathing,Feeding,Dressing,Total care Bathing Assistance: Maximum assistance Feeding assistance: Maximum assistance Dressing Assistance: Maximum assistance Total Care Assistance: Maximum assistance   Functional Limitations Info  Sight,Hearing,Speech Sight Info: Adequate Hearing Info: Adequate Speech Info: Impaired    SPECIAL CARE FACTORS FREQUENCY  PT (By licensed PT),OT (By licensed OT)     PT Frequency: 3X OT Frequency: 3X            Contractures Contractures Info: Not present    Additional Factors Info  Code Status,Allergies,Psychotropic,Insulin Sliding Scale,Isolation Precautions,Suctioning Needs Code Status Info: DNR Allergies Info: tape Psychotropic Info: n/a Insulin Sliding Scale Info: see d/c summary for sliding scale information Isolation Precautions Info: none Suctioning Needs: as needed   Current  Medications (11/07/2020):  This is the current hospital active medication list Current Facility-Administered Medications  Medication Dose Route Frequency Provider Last Rate Last Admin  . 0.9 %  sodium chloride infusion   Intravenous PRN  Icard, Bradley L, DO 10 mL/hr at 11/01/20 2145 250 mL at 11/01/20 2145  . acetaminophen (TYLENOL) tablet 650 mg  650 mg Per Tube Q6H PRN Icard, Bradley L, DO   650 mg at 11/01/20 1537  . brimonidine (ALPHAGAN) 0.2 % ophthalmic solution 1 drop  1 drop Both Eyes TID Theotis Barrio, MD   1 drop at 11/07/20 0912  . ceFAZolin (ANCEF) IVPB 2g/100 mL premix  2 g Intravenous Q8H Della Goo, RPH 200 mL/hr at 11/07/20 0536 2 g at 11/07/20 0536  . Chlorhexidine Gluconate Cloth 2 % PADS 6 each  6 each Topical Daily Icard, Bradley L, DO   6 each at 11/06/20 0942  . diltiazem (CARDIZEM) 10 mg/ml oral suspension 60 mg  60 mg Per Tube Q8H Osvaldo Shipper, MD   60 mg at 11/07/20 0535  . docusate (COLACE) 50 MG/5ML liquid 100 mg  100 mg Per Tube BID PRN Icard, Bradley L, DO      . feeding supplement (GLUCERNA SHAKE) (GLUCERNA SHAKE) liquid 237 mL  237 mL Oral TID BM Dorcas Carrow, MD      . heparin injection 5,000 Units  5,000 Units Subcutaneous Q8H Icard, Bradley L, DO   5,000 Units at 11/07/20 0535  . HYDROmorphone (DILAUDID) injection 0.5 mg  0.5 mg Intravenous Q3H PRN Theotis Barrio, MD   0.5 mg at 11/02/20 0211  . insulin aspart (novoLOG) injection 0-5 Units  0-5 Units Subcutaneous QHS Dorcas Carrow, MD      . insulin aspart (novoLOG) injection 0-9 Units  0-9 Units Subcutaneous TID WC Dorcas Carrow, MD   1 Units at 11/07/20 1307  . latanoprost (XALATAN) 0.005 % ophthalmic solution 1 drop  1 drop Both Eyes QHS Theotis Barrio, MD   1 drop at 11/06/20 2230  . metoprolol tartrate (LOPRESSOR) 25 mg/10 mL oral suspension 50 mg  50 mg Per Tube BID Osvaldo Shipper, MD   50 mg at 11/07/20 0913  . metoprolol tartrate (LOPRESSOR) injection 2.5 mg  2.5 mg Intravenous Q6H PRN Osvaldo Shipper, MD   2.5 mg at 10/27/20 1028  . multivitamin with minerals tablet 1 tablet  1 tablet Oral Daily Dorcas Carrow, MD      . mupirocin ointment (BACTROBAN) 2 %   Topical BID Dorcas Carrow, MD   Given at 11/07/20 0915  . oxyCODONE  (ROXICODONE) 5 MG/5ML solution 5 mg  5 mg Per Tube Q6H PRN Icard, Bradley L, DO   5 mg at 11/05/20 5681  . pantoprazole sodium (PROTONIX) 40 mg/20 mL oral suspension 40 mg  40 mg Per Tube QHS Calton Dach I, RPH   40 mg at 11/06/20 2229  . pneumococcal 23 valent vaccine (PNEUMOVAX-23) injection 0.5 mL  0.5 mL Intramuscular Prior to discharge Osvaldo Shipper, MD      . polyethylene glycol (MIRALAX / GLYCOLAX) packet 17 g  17 g Per Tube Daily PRN Icard, Bradley L, DO      . rifampin (RIFADIN) 25 mg/mL oral suspension 300 mg  300 mg Per Tube Q12H Dorcas Carrow, MD   300 mg at 11/07/20 0914  . sodium chloride flush (NS) 0.9 % injection 5 mL  5 mL Intracatheter Q8H Richarda Overlie, MD   5 mL at 11/07/20 [phone]  Discharge Medications: Please see discharge summary for a list of discharge medications.  Relevant Imaging Results:  Relevant Lab Results:   Additional Information SS# [id number]  Beckie Busing, RN

## 2020-11-07 NOTE — Plan of Care (Signed)
  Problem: Education: Goal: Knowledge of General Education information will improve Description: Including pain rating scale, medication(s)/side effects and non-pharmacologic comfort measures Outcome: Progressing   Problem: Health Behavior/Discharge Planning: Goal: Ability to manage health-related needs will improve Outcome: Progressing   Problem: Clinical Measurements: Goal: Ability to maintain clinical measurements within normal limits will improve Outcome: Progressing Goal: Will remain free from infection Outcome: Progressing Goal: Diagnostic test results will improve Outcome: Progressing Goal: Respiratory complications will improve Outcome: Progressing Goal: Cardiovascular complication will be avoided Outcome: Progressing   Problem: Activity: Goal: Risk for activity intolerance will decrease Outcome: Progressing   Problem: Nutrition: Goal: Adequate nutrition will be maintained Outcome: Progressing   Problem: Coping: Goal: Level of anxiety will decrease Outcome: Progressing   Problem: Elimination: Goal: Will not experience complications related to bowel motility Outcome: Progressing Goal: Will not experience complications related to urinary retention Outcome: Progressing   Problem: Pain Managment: Goal: General experience of comfort will improve Outcome: Progressing   Problem: Safety: Goal: Ability to remain free from injury will improve Outcome: Progressing   Problem: Skin Integrity: Goal: Risk for impaired skin integrity will decrease Outcome: Progressing   Problem: Nutrition Goal: Patient maintains adequate hydration Outcome: Progressing Goal: Patient maintains weight Outcome: Progressing Goal: Patient/Family demonstrates understanding of diet Outcome: Progressing Goal: Patient/Family independently completes tube feeding Outcome: Progressing Goal: Patient will have no more than 5 lb weight change during LOS Outcome: Progressing Goal: Patient will  utilize adaptive techniques to administer nutrition Outcome: Progressing Goal: Patient will verbalize dietary restrictions Outcome: Progressing   

## 2020-11-07 NOTE — Progress Notes (Signed)
PROGRESS NOTE    Marcus LankSamuel J Debes Jr.  ZOX:[id number] DOB: 01/10/1941 DOA: 11/11/2020 PCP: Eustaquio BoydenGutierrez, Javier, MD    Brief Narrative:  79 year old gentleman with history of vascular dementia, previous CVA with residual left hemiplegia, essential hypertension, type 2 diabetes, dysphagia and hyperlipidemia, chronic urinary retention with indwelling Foley catheter secondary to BPH and recent left femoral neck fracture status post left hemiarthroplasty on 9/21 presented back from nursing home with altered mental status, agitation and AKI with electrolyte abnormalities.  He was found to have abscess surrounding the arthroplasty hardware.  Not a surgical candidate.  Remains in poor overall clinical status.  Treated for MSSA infection. Multiple conversations with family about palliative or hospice, family not ready yet. 12/20-21, Dobbhoff tube was removed.  Patient is eating about 50% of his meals. IR following for left hip drain.   Assessment & Plan:   Principal Problem:   MSSA bacteremia Active Problems:   Protein-calorie malnutrition, severe (HCC)   Encounter for nasogastric (NG) tube placement   Severe sepsis (HCC)   Abscess of left hip   Hypernatremia   Abscess   Acute kidney injury (nontraumatic) (HCC)   Dehydration   Elevated liver function tests   Prosthetic hip infection (HCC)   Hyperkalemia  MSSA bacteremia/sepsis secondary to MSSA left hip abscess with septic shock: Prosthetic hip infection. Treated with vasopressors and weaned off. Blood cultures 12/4, MSSA bacteremia Hip abscess 12/4, MSSA Urine culture 12/4, MSSA Repeat blood cultures negative.  Echocardiogram without any vegetation.  Not a good candidate for operative removal/spacer or multistage surgery. IR guided abscess drain, continues to drain.  Overall with poor clinical improvement. Currently remains on cefazolin and rifampicin.  Total 6 weeks of antibiotics planned. Antibiotic end date 12/03/2020. Oral intake is  better for last 24 hours after removing NG tube. Will transfer to nursing home in next 24 to 48 hours if he is able to eat at least 50% of his meals.  SVT and sinus tachycardia: Improving.  Treated with Cardizem.  Now on Cardizem and metoprolol and acceptable.  TSH 1.6.  Acute metabolic encephalopathy in the setting of history of stroke and underlying vascular dementia: Gradual worsening of the mental status.  Repeat MRI with no evidence of acute process.  Does have history of vascular dementia.   Followed by palliative.  Hospice recommended, family declined and could not make decisions. 12/20, patient started eating.  Dobbhoff tube discontinued. Some improvement in mental status, however overall remains in very mild debilitated state.  His albumin is 1.  Acute kidney injury with hypernatremia: Sodium level improved with increasing free water flush.  Normalized.  Type 2 diabetes, uncontrolled with hyperglycemia: A1c 8.1.  12/20, hypoglycemic episodes after stopping tube feeding and continuing on long-acting insulin. Decrease dose of long-acting insulin, sliding scale insulin to sensitive scale. If he is not eating well, he will not need that much insulin.  Dysphagia: Remained with poor mentation.  Now eating with help.   No PEG tube desired.   No reinsertion of Dobbhoff tube recommended.   We can see next few days and continue to have palliative discussion. Pressure Injury 08/07/20 Buttocks Right;Medial;Left Stage 2 -  Partial thickness loss of dermis presenting as a shallow open injury with a red, pink wound bed without slough. (Active)  08/07/20 1914  Location: Buttocks  Location Orientation: Right;Medial;Left  Staging: Stage 2 -  Partial thickness loss of dermis presenting as a shallow open injury with a red, pink wound bed without slough.  Wound  Description (Comments):   Present on Admission: Yes     Pressure Injury 11/02/20 Penis (Active)  11/02/20 0344  Location: Penis (distal  meatus)  Location Orientation:   Staging:   Wound Description (Comments):   Present on Admission:    Severe protein calorie malnutrition: Augmented with tube feeding.  Continue. Nutrition Status: Nutrition Problem: Severe Malnutrition Etiology: chronic illness (dementia) Signs/Symptoms: severe fat depletion,severe muscle depletion,percent weight loss (15.4% weight loss in 9 months) Percent weight loss: 15.4 % (less than 9 months) Interventions: Tube feeding,Prostat,MVI     DVT prophylaxis: heparin injection 5,000 Units Start: 10/22/20 1400 SCDs Start: 10/22/2020 1213   Code Status: DNR Family Communication: Patient's James called and updated 12/19. Called, unable to connect. Disposition Plan: Status is: Inpatient  Remains inpatient appropriate because:Altered mental status and Inpatient level of care appropriate due to severity of illness   Dispo:  Patient From: Skilled Nursing Facility  Planned Disposition: Skilled Nursing Facility  Expected discharge date: Tomorrow.  Medically stable for discharge: No  Patient is stabilizing.  Remains in poor overall clinical situation.  If he starts eating adequate at least more than 50% of the meal, will transfer to nursing home with IV antibiotics plan. He has a drain on his left hip that is still significantly draining, discharged with drain with radiology follow-up.        Consultants:   Orthopedics  Interventional radiology  Palliative medicine  ID  Procedures:   Left hip drain  Antimicrobials:  Antibiotics Given (last 72 hours)    Date/Time Action Medication Dose Rate   11/04/20 2251 New Bag/Given   rifampin (RIFADIN) 300 mg in sodium chloride 0.9 % 100 mL IVPB 300 mg 200 mL/hr   11/04/20 2339 New Bag/Given  [justcame from pharmacy]   ceFAZolin (ANCEF) IVPB 2g/100 mL premix 2 g 200 mL/hr   11/05/20 0607 New Bag/Given   ceFAZolin (ANCEF) IVPB 2g/100 mL premix 2 g 200 mL/hr   11/05/20 0824 New Bag/Given    rifampin (RIFADIN) 300 mg in sodium chloride 0.9 % 100 mL IVPB 300 mg 200 mL/hr   11/05/20 1311 New Bag/Given   ceFAZolin (ANCEF) IVPB 2g/100 mL premix 2 g 200 mL/hr   11/05/20 2132 New Bag/Given   rifampin (RIFADIN) 300 mg in sodium chloride 0.9 % 100 mL IVPB 300 mg 200 mL/hr   11/05/20 2228 New Bag/Given   ceFAZolin (ANCEF) IVPB 2g/100 mL premix 2 g 200 mL/hr   11/06/20 0504 New Bag/Given   ceFAZolin (ANCEF) IVPB 2g/100 mL premix 2 g 200 mL/hr   11/06/20 0263 New Bag/Given   rifampin (RIFADIN) 300 mg in sodium chloride 0.9 % 100 mL IVPB 300 mg 200 mL/hr   11/06/20 1443 New Bag/Given   ceFAZolin (ANCEF) IVPB 2g/100 mL premix 2 g 200 mL/hr   11/06/20 2229 Given   rifampin (RIFADIN) 25 mg/mL oral suspension 300 mg 300 mg    11/06/20 2233 New Bag/Given   ceFAZolin (ANCEF) IVPB 2g/100 mL premix 2 g 200 mL/hr   11/07/20 0536 New Bag/Given   ceFAZolin (ANCEF) IVPB 2g/100 mL premix 2 g 200 mL/hr   11/07/20 7858 Given   rifampin (RIFADIN) 25 mg/mL oral suspension 300 mg 300 mg          Subjective: Patient seen and examined.  No overnight events.  Nurse technician reported that he ate 75% of his dinner last night. Patient himself was somehow this morning today.  I asked him how are you, he answered "I am doing well  and how are you?"  Denies any pain.  Denies any needs.  Objective: Vitals:   11/06/20 1855 11/06/20 2043 11/07/20 0440 11/07/20 0747  BP: (!) 142/86 (!) 146/69 (!) 144/86 (!) 141/73  Pulse: (!) 103 (!) 110 (!) 109 99  Resp: 20 19 20  (!) 22  Temp: 97.7 F (36.5 C) 97.9 F (36.6 C) 98 F (36.7 C) 98 F (36.7 C)  TempSrc:   Oral   SpO2: 100% 99% 100% 100%  Weight:      Height:        Intake/Output Summary (Last 24 hours) at 11/07/2020 1304 Last data filed at 11/07/2020 11/09/2020 Gross per 24 hour  Intake 135 ml  Output 2840 ml  Net -2705 ml   Filed Weights   11/01/20 0347 11/02/20 0825 11/05/20 0500  Weight: 75 kg 71.5 kg 72.8 kg    Examination:  General exam:  Sick looking gentleman who is very frail and debilitated.  Wakes up on voice, answers few simple questions. Respiratory system: Clear to auscultation. Respiratory effort normal.  Mostly conducted airway sounds. Cardiovascular system: S1 & S2 heard, RRR Gastrointestinal system: Soft.  Not distended.  Bowel sounds present.   Central nervous system: Mostly sleepy but wakes up on voice. Left hemiplegic. Foley catheter with clear urine. Psychiatry: Judgement and insight appear compromised. Left hip with JP drain, serosanguineous drainage present.    Data Reviewed: I have personally reviewed following labs and imaging studies  CBC: Recent Labs  Lab 11/02/20 0025 11/04/20 0150 11/05/20 0023 11/06/20 0146 11/07/20 0132  WBC 9.6 6.2 6.4 6.8 7.4  NEUTROABS  --  4.1 4.3 3.7 4.9  HGB 8.9* 8.3* 8.9* 8.7* 9.5*  HCT 26.4* 24.6* 25.5* 25.6* 28.5*  MCV 76.7* 77.1* 77.7* 77.8* 79.6*  PLT 351 432* 345 455* 499*   Basic Metabolic Panel: Recent Labs  Lab 11/01/20 0601 11/02/20 0025 11/04/20 0150 11/05/20 0023 11/06/20 0146  NA 135 136 137 132* 136  K 4.5 4.8 3.8 4.3 3.9  CL 101 104 106 101 105  CO2 26 25 26  21* 26  GLUCOSE 241* 206* 234* 250* 54*  BUN 18 18 12 13 12   CREATININE 0.90 0.95 0.68 0.70 0.71  CALCIUM 7.7* 7.6* 7.5* 7.3* 7.6*  MG  --   --  1.7  --   --   PHOS  --   --  2.1*  --   --    GFR: Estimated Creatinine Clearance: 78.4 mL/min (by C-G formula based on SCr of 0.71 mg/dL). Liver Function Tests: Recent Labs  Lab 11/02/20 0025 11/04/20 0150 11/05/20 0023 11/06/20 0146  AST 22 22 24 20   ALT 10 8 10 9   ALKPHOS 192* 190* 215* 242*  BILITOT 0.7 0.6 0.5 0.6  PROT 6.9 6.9 7.0 7.0  ALBUMIN 1.0* 1.0* 1.1* 1.1*   No results for input(s): LIPASE, AMYLASE in the last 168 hours. No results for input(s): AMMONIA in the last 168 hours. Coagulation Profile: No results for input(s): INR, PROTIME in the last 168 hours. Cardiac Enzymes: No results for input(s): CKTOTAL,  CKMB, CKMBINDEX, TROPONINI in the last 168 hours. BNP (last 3 results) No results for input(s): PROBNP in the last 8760 hours. HbA1C: No results for input(s): HGBA1C in the last 72 hours. CBG: Recent Labs  Lab 11/06/20 1915 11/06/20 2257 11/07/20 0452 11/07/20 0746 11/07/20 1255  GLUCAP 146* 209* 173* 163* 135*   Lipid Profile: No results for input(s): CHOL, HDL, LDLCALC, TRIG, CHOLHDL, LDLDIRECT in the last 72 hours.  Thyroid Function Tests: No results for input(s): TSH, T4TOTAL, FREET4, T3FREE, THYROIDAB in the last 72 hours. Anemia Panel: No results for input(s): VITAMINB12, FOLATE, FERRITIN, TIBC, IRON, RETICCTPCT in the last 72 hours. Sepsis Labs: No results for input(s): PROCALCITON, LATICACIDVEN in the last 168 hours.  Recent Results (from the past 240 hour(s))  Culture, Urine     Status: Abnormal   Collection Time: 11/02/20  5:08 AM   Specimen: Urine, Random  Result Value Ref Range Status   Specimen Description URINE, RANDOM  Final   Special Requests   Final    NONE Performed at [facility], [address], [place], [place] [address]    Culture 40,000 COLONIES/mL YEAST (A)  Final   Report Status 11/03/2020 FINAL  Final         Radiology Studies: No results found.      Scheduled Meds: . brimonidine  1 drop Both Eyes TID  . Chlorhexidine Gluconate Cloth  6 each Topical Daily  . diltiazem  60 mg Per Tube Q8H  . feeding supplement (PROSource TF)  45 mL Per Tube Daily  . heparin injection (subcutaneous)  5,000 Units Subcutaneous Q8H  . insulin aspart  0-5 Units Subcutaneous QHS  . insulin aspart  0-9 Units Subcutaneous TID WC  . latanoprost  1 drop Both Eyes QHS  . metoprolol tartrate  50 mg Per Tube BID  . mupirocin ointment   Topical BID  . pantoprazole sodium  40 mg Per Tube QHS  . rifampin  300 mg Per Tube Q12H  . sodium chloride flush  5 mL Intracatheter Q8H   Continuous Infusions: . sodium chloride 250 mL (11/01/20 2145)  .  ceFAZolin  (ANCEF) IV 2 g (11/07/20 0536)     LOS: 17 days    Time spent: 30 minutes    Dorcas Carrow, MD [facility] Pager [phone]

## 2020-11-07 NOTE — TOC Progression Note (Addendum)
Transition of Care (TOC) - Progression Note    Patient Details  Name: Marcus Downs. MRN: [id number] Date of Birth: 08-11-1941  Transition of Care [facility]) CM/SW Contact  Beckie Busing, RN Phone Number: [phone]  11/07/2020, 1:56 PM  Clinical Narrative:    CM received secure chat from MD stating that patient may be able to return to [facility] 12/22. CM called [employer] and spoke with Lowella Bandy who states that she will need to look at patients information. Updated FL2 is needed. CM to complete and documented.  1500 CM received call from [place] at [facility] to review patients condition and need for IV antibiotics. Lowella Bandy will check with her pharmacy to ensure that the facility will be able to provide IV antibiotics. Lowella Bandy states that we can plan for patients return tomorrow (11/08/20).   1545 FL2 completed and sent to [employer]    Barriers to Discharge: Continued Medical Work up  Expected Discharge Plan and Services                                                 Social Determinants of Health (SDOH) Interventions    Readmission Risk Interventions Readmission Risk Prevention Plan 11/01/2020  Transportation Screening Complete  PCP or Specialist Appt within 3-5 Days Complete  HRI or Home Care Consult Not Complete  HRI or Home Care Consult comments n/a returning back to snf  Social Work Consult for Recovery Care Planning/Counseling Complete  Palliative Care Screening Complete  Medication Review [employer]) Referral to Pharmacy  Some recent data might be hidden

## 2020-11-07 NOTE — Progress Notes (Signed)
  Speech Language Pathology Treatment: Dysphagia  Patient Details Name: Marcus Downs. MRN: [id number] DOB: February 15, 1941 Today's Date: 11/07/2020 Time: 7829-5621 SLP Time Calculation (min) (ACUTE ONLY): 16 min  Assessment / Plan / Recommendation Clinical Impression  Followed up for dysphagia intervention. Pt assessed with puree and thin liquid lunch tray. Appetite remains reduced, with little PO intake. Pt agreeable to magic cup supplement and thin liquids, declined meal following 1-2 bites, despite encouragement. Oral manipulation of puree was slowed with decreased bolus cohesion and oral residuals. Pt edentulous, no dentures noted at bedside. Pt with generalized oral musculature weakness. Pt not appearing good candidate for diet upgrade with cognitive deficits, lack of dentition, musculature weakness, and suspected low endurance. Continue dysphagia 1 (puree) and thin liquids with medicines crushed in puree. No further ST needs identified.      HPI HPI: Patient is a 79 y.o. male with PMH: CVA with residual dysphagia (dysphagia 1 and thin liquids was recommened on 08/14/20) and hemiplegia, GERD, DM-2, HTN, HLD, weight loss, s/p hip hemiarthroplasty. He presented to hospital from SNF, s/p hip fracture and surgery 3 months ago and was found to be septic wih a UTI and hip abscess. SLP was following pt from 12/4-12/10 but services were discontinued since lethargy was consistency limiting his participation and it was requested that SLP be reconsulted if pt's status improved. Palliative care was consulted and prognosis judged to be poor "given vascular dementia and weakness he is at high risk for acute decline or death from aspiration or lack of PO intake." Pt was more during meeting with palliative care they therefore requested re-evaluation on 12/17.      SLP Plan  Discharge SLP treatment due to (comment) (maximum progress exhibited)       Recommendations  Diet recommendations: Dysphagia 1  (puree);Thin liquid Liquids provided via: Cup;Straw Medication Administration: Crushed with puree Supervision: Staff to assist with self feeding;Full supervision/cueing for compensatory strategies Compensations: Slow rate;Minimize environmental distractions;Small sips/bites;Follow solids with liquid Postural Changes and/or Swallow Maneuvers: Seated upright 90 degrees;Upright 30-60 min after meal                Oral Care Recommendations: Oral care BID Follow up Recommendations: Skilled Nursing facility SLP Visit Diagnosis: Dysphagia, unspecified (R13.10) Plan: Discharge SLP treatment due to (comment) (maximum progress exhibited)       GO                Raina Sole E Mayetta Castleman MA, CCC-SLP Acute Rehabilitation Services  11/07/2020, 1:59 PM

## 2020-11-08 DIAGNOSIS — R638 Other symptoms and signs concerning food and fluid intake: Secondary | ICD-10-CM

## 2020-11-08 DIAGNOSIS — R6521 Severe sepsis with septic shock: Secondary | ICD-10-CM

## 2020-11-08 DIAGNOSIS — E875 Hyperkalemia: Secondary | ICD-10-CM

## 2020-11-08 LAB — MAGNESIUM: Magnesium: 2 mg/dL (ref 1.7–2.4)

## 2020-11-08 LAB — CBC WITH DIFFERENTIAL/PLATELET
Abs Immature Granulocytes: 0.04 10*3/uL (ref 0.00–0.07)
Basophils Absolute: 0 10*3/uL (ref 0.0–0.1)
Basophils Relative: 1 %
Eosinophils Absolute: 0.5 10*3/uL (ref 0.0–0.5)
Eosinophils Relative: 9 %
HCT: 27.4 % — ABNORMAL LOW (ref 39.0–52.0)
Hemoglobin: 9.6 g/dL — ABNORMAL LOW (ref 13.0–17.0)
Immature Granulocytes: 1 %
Lymphocytes Relative: 28 %
Lymphs Abs: 1.6 10*3/uL (ref 0.7–4.0)
MCH: 26.9 pg (ref 26.0–34.0)
MCHC: 35 g/dL (ref 30.0–36.0)
MCV: 76.8 fL — ABNORMAL LOW (ref 80.0–100.0)
Monocytes Absolute: 0.6 10*3/uL (ref 0.1–1.0)
Monocytes Relative: 10 %
Neutro Abs: 2.9 10*3/uL (ref 1.7–7.7)
Neutrophils Relative %: 51 %
Platelets: 405 10*3/uL — ABNORMAL HIGH (ref 150–400)
RBC: 3.57 MIL/uL — ABNORMAL LOW (ref 4.22–5.81)
RDW: 20.3 % — ABNORMAL HIGH (ref 11.5–15.5)
WBC: 5.5 10*3/uL (ref 4.0–10.5)
nRBC: 0 % (ref 0.0–0.2)

## 2020-11-08 LAB — GLUCOSE, CAPILLARY
Glucose-Capillary: 182 mg/dL — ABNORMAL HIGH (ref 70–99)
Glucose-Capillary: 139 mg/dL — ABNORMAL HIGH (ref 70–99)
Glucose-Capillary: 239 mg/dL — ABNORMAL HIGH (ref 70–99)
Glucose-Capillary: 334 mg/dL — ABNORMAL HIGH (ref 70–99)

## 2020-11-08 LAB — BASIC METABOLIC PANEL
Anion gap: 8 (ref 5–15)
BUN: 13 mg/dL (ref 8–23)
CO2: 21 mmol/L — ABNORMAL LOW (ref 22–32)
Calcium: 7.7 mg/dL — ABNORMAL LOW (ref 8.9–10.3)
Chloride: 107 mmol/L (ref 98–111)
Creatinine, Ser: 0.79 mg/dL (ref 0.61–1.24)
GFR, Estimated: >60 mL/min (ref >60)
Glucose, Bld: 196 mg/dL — ABNORMAL HIGH (ref 70–99)
Potassium: 4.1 mmol/L (ref 3.5–5.1)
Sodium: 136 mmol/L (ref 135–145)

## 2020-11-08 LAB — PHOSPHORUS: Phosphorus: 2.8 mg/dL (ref 2.5–4.6)

## 2020-11-08 MED ORDER — CALCIUM GLUCONATE-NACL 1-0.675 GM/50ML-% IV SOLN
1.0000 g | Freq: Once | INTRAVENOUS | Status: AC
  Administered 2020-11-08: 1000 mg via INTRAVENOUS
  Filled 2020-11-08: qty 50

## 2020-11-08 MED ORDER — RIFAMPIN 300 MG PO CAPS
300.0000 mg | ORAL_CAPSULE | Freq: Two times a day (BID) | ORAL | Status: DC
  Filled 2020-11-08: qty 1

## 2020-11-08 MED ORDER — METOPROLOL TARTRATE 25 MG/10 ML ORAL SUSPENSION
50.0000 mg | Freq: Two times a day (BID) | ORAL | Status: DC
  Administered 2020-11-08 – 2020-11-18 (×15): 50 mg via ORAL
  Filled 2020-11-08 (×22): qty 20

## 2020-11-08 MED ORDER — PANTOPRAZOLE SODIUM 40 MG PO TBEC: 40.0000 mg | DELAYED_RELEASE_TABLET | Freq: Every day | ORAL | Status: DC

## 2020-11-08 MED ORDER — RIFAMPIN ORAL SUSPENSION 25 MG/ML
300.0000 mg | Freq: Two times a day (BID) | ORAL | Status: DC
  Administered 2020-11-08 – 2020-11-18 (×15): 300 mg via ORAL
  Filled 2020-11-08 (×24): qty 12

## 2020-11-08 MED ORDER — OXYCODONE HCL 5 MG/5ML PO SOLN
5.0000 mg | Freq: Four times a day (QID) | ORAL | Status: DC | PRN
  Administered 2020-11-08 – 2020-11-16 (×5): 5 mg via ORAL
  Filled 2020-11-08 (×5): qty 5

## 2020-11-08 MED ORDER — PANTOPRAZOLE SODIUM 40 MG PO PACK
40.0000 mg | PACK | Freq: Every day | ORAL | Status: DC
  Administered 2020-11-08 – 2020-11-17 (×8): 40 mg via ORAL
  Filled 2020-11-08 (×11): qty 20

## 2020-11-08 MED ORDER — DOCUSATE SODIUM 50 MG/5ML PO LIQD: 100.0000 mg | Freq: Two times a day (BID) | ORAL | Status: DC | PRN

## 2020-11-08 MED ORDER — ACETAMINOPHEN 325 MG PO TABS
650.0000 mg | ORAL_TABLET | Freq: Four times a day (QID) | ORAL | Status: DC | PRN
  Filled 2020-11-08: qty 2

## 2020-11-08 MED ORDER — POLYETHYLENE GLYCOL 3350 17 G PO PACK: 17.0000 g | PACK | Freq: Every day | ORAL | Status: DC | PRN

## 2020-11-08 MED ORDER — DILTIAZEM 12 MG/ML ORAL SUSPENSION
60.0000 mg | Freq: Three times a day (TID) | ORAL | Status: DC
  Administered 2020-11-08 – 2020-11-19 (×25): 60 mg via ORAL
  Filled 2020-11-08 (×34): qty 6

## 2020-11-08 NOTE — Progress Notes (Signed)
Calorie Count Note  48 hour calorie count ordered.  Diet: Dysphagia 1 with thin liquids  Supplements: Glucerna shakes po TID, Magic Cup po TID  Day 1 Results (11/07/20): Breakfast: no meal documentation available Lunch: 159 kcals, 11 grams of protein Dinner: no meal documentation available Supplements: 290 kcals, 9 grams of protein  Total intake: 449 kcal (23% of minimum estimated needs)  20g protein (21% of minimum estimated needs)  Day 2 Results (11/08/20): Breakfast: 50 kcals, 0 grams of protein Lunch: will follow-up with results tomorrow Dinner: will follow-up with results tomorrow Supplements: 290 kcals, 9 grams of protein   Very limited meal documentation has been completed by nursing staff. Discussed with MD, RN, and NT. Stressed importance of keeping accurate documentation of meal/supplement consumption with RN/NT. Given limited documentation, it is unclear at this time if the pt has been consuming Glucerna well. RN documentation shows pt accepted the shakes each time they were offered, but there is no indication of the amount the pt actually consumed. Discussed supplement consumption with RN who could not speak for the pt's intake yesterday, but reports pt currently drinking a shake. RN to document percentage consumed of each supplement.   Nutrition Dx: Severe Malnutrition related to chronic illness (dementia) as evidenced by severe fat depletion,severe muscle depletion,percent weight loss (15.4% weight loss in 9 months).  Goal: Patient will meet greater than or equal to 90% of their needs  Intervention:  Continue 48-hour calorie count per MD  Continue Glucerna Shake po TID, each supplement provides 220 kcal and 10 grams of protein  Continue Magic cup TID with meals, each supplement provides 290 kcal and 9 grams of protein  Continue MVI daily   Eugene Gavia, MS, RD, LDN RD pager number and weekend/on-call pager number located in Amion.

## 2020-11-08 NOTE — Progress Notes (Signed)
PROGRESS NOTE    Marcus Downs.  BTY:[id number] DOB: 1940/12/24 DOA: Nov 14, 2020 PCP: [name], MD   Brief Narrative:  Patient is a 79 year old African-American male with a past medical history significant for but not limited to vascular dementia, previous CVA with residual left-sided hemiplegia, essential hypertension, type 2 diabetes mellitus, dysphagia and hyperlipidemia as well as chronic urine retention with indwelling Foley catheter secondary to BPH and recent left femoral neck fracture status post left hemiarthroplasty on 08/08/2020 who presented from his nursing facility with altered mental status, agitation and acute kidney injury with electrolyte abnormalities with hyponatremia.  He was in septic shock and found to have an abscess around his arthroplasty hardware and an MSSA bacteremia and was deemed not a surgical candidate.  He was empirically treated with IV antibiotics for his MSSA infection and ended up having a left hip drain. He was transferred back to [facility] service 10/24/20. Overall his prognosis and clinical status remained poor and my colleagues have had multiple conversations with family about palliative care or hospice and family not ready yet.  On 11/08/2020 is pain to tube was removed and he was eating about 50% of his meals but this is not been adequately documented.  We will ask for a calorie count to see if the patient actually nutrition orally.  If he does not he may be hospice appropriate.  Palliative care following appreciate continue goals of care discussion.  Assessment & Plan:   Principal Problem:   MSSA bacteremia Active Problems:   Protein-calorie malnutrition, severe (HCC)   Encounter for nasogastric (NG) tube placement   Severe sepsis (HCC)   Abscess of left hip   Hypernatremia   Abscess   Acute kidney injury (nontraumatic) (HCC)   Dehydration   Elevated liver function tests   Prosthetic hip infection (HCC)   Hyperkalemia   MSSA bacteremia/sepsis  secondary to MSSA left hip abscess with septic shock: Prosthetic hip infection. poA Catheter Associated UTI -Treated with vasopressors and weaned off. -Blood cultures 12/4, MSSA bacteremia -Hip abscess 12/4, MSSA -Urine culture 12/4, MSSA -Repeat blood cultures negative.  Echocardiogram without any vegetation.   -Not a good candidate for operative removal/spacer or multistage surgery. -IR guided abscess drain, continues to drain.  Overall with poor clinical improvement. -Currently remains on cefazolin and rifampicin.  Total 6 weeks of antibiotics planned. -Antibiotic end date 12/03/2020. -Oral intake was initially better for last 24 hours after removing NG tube but has declined again. -Plan was to transfer to nursing home in next 24 to 48 hours if he is able to eat at least 50% of his meals but calorie count is inaccurate and will need to ensure that patient is adequately meeting his needs; if he is not tolerating p.o. left need to reevaluate goals of care discussion and he may be hospice appropriate as NG tube will not be replaced and PEG tube is not desired.  SVT and Sinus Tachycardia -Improving.  Treated with Cardizem.   -Now on Cardizem 60 mg po q8h and Metoprolol Tartrate 50 mg po BID and IV Metoprolol 2.5 IV q6hprn for HR>120 Sustained -Currently HR acceptable ranging from 98-102.   -TSH 1.6.  Acute metabolic encephalopathy in the setting of history of stroke and underlying vascular dementia -Gradual worsening of the mental status.   -Repeat MRI with no evidence of acute process.  Does have history of vascular dementia.   -Followed by palliative.  -Hospice recommended, family declined and could not make decisions. 12/20, patient started  eating some but was not awake enough to take anything orally today.  Dobbhoff tube discontinued. -Some improvement in mental status, however overall remains in very mild debilitated state.  His albumin is 1.1 on last check -With calorie count and see  how the patient tolerates oral intake if he does  Hypocalcemia -Patient's calcium was 7.7  -Albumin not checked for corrected calcium -Replete with IV calcium gluconate 1 g -We will continue to monitor and trend and repeat CMP in a.m.  Thrombocytosis -Likely reactive  -Patient's platelet count went from 455 -> 499 -> 405 -Continue Monitor and Trend -Repeat CBC in the AM  Microcytic Anemia -Patient's Hgb/Hct went from 8.7/25.6 -> 9.5/28.5 -> 9.6/27.4 and is relatively Stable -Check Anemia Panel in the AM -Continue to Monitor for S/Sx of Bleeding; Currently no overt bleeding noted -Repeat CBC in the AM  GERD/GI Prophylaxis -C/w Pantoprazole 40 mg po Daily   Acute Kidney Injury with Hypernatremia -Sodium level improved with increasing free water flush.   -Renal function and sodium have both normalized. -Patient's sodium this morning was 136 and patient's BUNs/creatinine is now 13/0.79 -Continue to monitor and trend and repeat CMP in a.m.  Type 2 Diabetes, uncontrolled with Hyperglycemia -> Hypoglycemia -HbA1c 8.1.  -On 12/20, hypoglycemic episodes after stopping tube feeding and continuing on long-acting insulin. -Decrease dose of long-acting insulin, sliding scale insulin to sensitive scale. He is now on Sensitive Novolog SSI AC/HS -If he is not eating well, he will not need that much insulin. -CBGs ranging from 135-182; 2 to monitor and adjust blood sugars as necessary  Dysphagia -Remained with poor mentation.  Now eating with help but his oral intake is not being properly documented.   -He does not want no PEG tube desired.   -No reinsertion of Dobbhoff tube recommended.   -We can see next few days and continue to have palliative discussion and if he is not improving may be hospice appropriate  Pressure Injury - Pressure Injury 08/07/20 Buttocks Right;Medial;Left Stage 2 -  Partial thickness loss of dermis presenting as a shallow open injury with a red, pink wound bed  without slough. (Active)  08/07/20 1914  Location: Buttocks  Location Orientation: Right;Medial;Left  Staging: Stage 2 -  Partial thickness loss of dermis presenting as a shallow open injury with a red, pink wound bed without slough.  Wound Description (Comments):   Present on Admission: Yes     Pressure Injury 11/02/20 Penis (Active)  11/02/20 0344  Location: Penis (distal meatus)  Location Orientation:   Staging:   Wound Description (Comments):   Present on Admission:    Severe Malnutrition in the Context of Chronic Illness -Estimated body mass index is 21.77 kg/m as calculated from the following:   Height as of this encounter: 6' (1.829 m).   Weight as of this encounter: 72.8 kg.  -Nutrition Problem: Severe Malnutrition -Etiology: chronic illness (Dementia) -Signs/Symptoms: severe fat depletion,severe muscle depletion,percent weight loss (15.4% weight loss in 9 months) -Percent weight loss: 15.4 % (less than 9 months) -Interventions: Tube feeding,Prostat,MVI; TF now discontinued and Panda Tube Removed -Nutritionist consulted for further evaluation and recommendations -C/w 48 hour Calorie Count but she has not been keeping track of his meals appropriately so we will need to extend this out given the very limited no documentation abdomen completed by nursing staff.  Nutrition was stressing the importance of keeping accurate documentation and it is unclear if the patient is consuming his supplements well.  Will need to make an  informed decision of whether he is able to adequately meet his nutritional needs orally we will need to have continued goals of care discussion and possible transfer to hospice if he does not -Nutritionist recommending c/w Glucerna Shake po TID, Magic Cup TIDwm, and MVI Daily; stressed importance of obtaining a calorie count  DVT prophylaxis: Heparin 5,000 units sq q8h Code Status: DO NOT RESUSCITATE  Family Communication: No family present at bedside   Disposition Plan: Anticipating D/C to SNF if able to tolerate po Intake adequately   Status is: Inpatient  Remains inpatient appropriate because:Unsafe d/c plan, IV treatments appropriate due to intensity of illness or inability to take PO and Inpatient level of care appropriate due to severity of illness   Dispo:  Patient From: Skilled Nursing Facility  Planned Disposition: Skilled Nursing Facility  Expected discharge date: 11/10/2020  Medically stable for discharge: No  Consultants:   Palliative Care  Infectious Diseases  Interventional Radiology  PCCM Transfer  Orthopedic Surgery   Procedures:  Left hip abscess s/p abscess drain to the left thigh on 11/06/2020  Antimicrobials: Anti-infectives (From admission, onward)   Start     Dose/Rate Route Frequency Ordered Stop   11/06/20 2200  rifampin (RIFADIN) 25 mg/mL oral suspension 300 mg        300 mg Per Tube Every 12 hours 11/06/20 1008     10/26/20 1100  rifampin (RIFADIN) 300 mg in sodium chloride 0.9 % 100 mL IVPB  Status:  Discontinued        300 mg 200 mL/hr over 30 Minutes Intravenous Every 12 hours 10/26/20 1007 11/06/20 1008   10/24/20 0815  ceFAZolin (ANCEF) IVPB 2g/100 mL premix        2 g 200 mL/hr over 30 Minutes Intravenous Every 8 hours 10/24/20 0804     10/23/20 2200  ceFAZolin (ANCEF) IVPB 2g/100 mL premix  Status:  Discontinued        2 g 200 mL/hr over 30 Minutes Intravenous Every 12 hours 10/23/20 1406 10/24/20 0804   10/22/20 1000  ceFAZolin (ANCEF) IVPB 1 g/50 mL premix  Status:  Discontinued        1 g 100 mL/hr over 30 Minutes Intravenous Every 12 hours 02-06-20 1745 10/23/20 1406   02-06-20 2200  ceFAZolin (ANCEF) IVPB 2g/100 mL premix        2 g 200 mL/hr over 30 Minutes Intravenous  Once 02-06-20 1739 02-06-20 2239   02-06-20 2100  cefTRIAXone (ROCEPHIN) 2 g in sodium chloride 0.9 % 100 mL IVPB  Status:  Discontinued        2 g 200 mL/hr over 30 Minutes Intravenous Every 24 hours 02-06-20  1117 02-06-20 1129   02-06-20 1730  linezolid (ZYVOX) IVPB 600 mg  Status:  Discontinued        600 mg 300 mL/hr over 60 Minutes Intravenous Every 12 hours 02-06-20 1154 02-06-20 1739   02-06-20 1130  cefTRIAXone (ROCEPHIN) 2 g in sodium chloride 0.9 % 100 mL IVPB  Status:  Discontinued        2 g 200 mL/hr over 30 Minutes Intravenous Every 24 hours 02-06-20 1129 02-06-20 1739   02-06-20 1000  meropenem (MERREM) 500 mg in sodium chloride 0.9 % 100 mL IVPB  Status:  Discontinued        500 mg 200 mL/hr over 30 Minutes Intravenous Every 12 hours 02-06-20 0330 02-06-20 1117   02-06-20 0400  linezolid (ZYVOX) IVPB 600 mg  600 mg 300 mL/hr over 60 Minutes Intravenous  Once 11/06/2020 0309 11/15/2020 0654   11/16/2020 0215  ceFEPIme (MAXIPIME) 2 g in sodium chloride 0.9 % 100 mL IVPB        2 g 200 mL/hr over 30 Minutes Intravenous  Once 11/12/2020 0212 10/29/2020 0306   11/01/2020 0215  ampicillin-sulbactam (UNASYN) 1.5 g in sodium chloride 0.9 % 100 mL IVPB  Status:  Discontinued        1.5 g 200 mL/hr over 30 Minutes Intravenous  Once 10/28/2020 0212 10/29/2020 0214   11/12/2020 0215  vancomycin (VANCOCIN) IVPB 1000 mg/200 mL premix  Status:  Discontinued        1,000 mg 200 mL/hr over 60 Minutes Intravenous  Once 10/31/2020 1610 10/25/2020 0314        Subjective: Seen and examined at bedside and he was not able to provide a subjective history given that he is somnolent and not really aroused today.  Nursing states that he has not been eating very well today.  No complaints noted.  No acute events overnight noted either.  No other concerns or plans at this time and no family is at bedside.  Objective: Vitals:   11/07/20 1405 11/07/20 1530 11/07/20 2302 11/08/20 0632  BP:   (!) 111/59 (!) 122/59  Pulse: (!) 102 99 98 98  Resp:   16 20  Temp:   97.9 F (36.6 C) 97.9 F (36.6 C)  TempSrc:   Oral Oral  SpO2: 100%  100% 100%  Weight:      Height:        Intake/Output Summary (Last 24 hours) at  11/08/2020 0746 Last data filed at 11/08/2020 0522 Gross per 24 hour  Intake 5 ml  Output 1035 ml  Net -1030 ml   Filed Weights   11/01/20 0347 11/02/20 0825 11/05/20 0500  Weight: 75 kg 71.5 kg 72.8 kg   Examination: Physical Exam:  Constitutional: Thin chronically ill-appearing African-American male in no acute distress appears somnolent and does not really interact or provide a subjective history given his current condition Eyes: Lids and conjunctivae normal, sclerae anicteric  ENMT: External Ears, Nose appear normal.  Neck: Appears normal, supple, no cervical masses, normal ROM, no appreciable thyromegaly: No JVD Respiratory: Diminished to auscultation bilaterally, no wheezing, rales, rhonchi or crackles. Normal respiratory effort and patient is not tachypenic. No accessory muscle use.  Unlabored breathing Cardiovascular: RRR, no murmurs / rubs / gallops. S1 and S2 auscultated.  Trace extremity edema Abdomen: Soft, non-tender, non-distended. Bowel sounds positive.  GU: Deferred.  Foley catheter is in place Musculoskeletal: No clubbing / cyanosis of digits/nails.  Has a drain in his hip Skin: Had some pressure ulcers noted. No induration; Warm and dry.  Neurologic: He is not awake and does not really follow commands today Psychiatric: Impaired judgment and insight.  Patient is somnolent and he is not awake or alert and oriented x 3.  Has a calm mood and appropriate affect.   Data Reviewed: I have personally reviewed following labs and imaging studies  CBC: Recent Labs  Lab 11/04/20 0150 11/05/20 0023 11/06/20 0146 11/07/20 0132 11/08/20 0136  WBC 6.2 6.4 6.8 7.4 5.5  NEUTROABS 4.1 4.3 3.7 4.9 2.9  HGB 8.3* 8.9* 8.7* 9.5* 9.6*  HCT 24.6* 25.5* 25.6* 28.5* 27.4*  MCV 77.1* 77.7* 77.8* 79.6* 76.8*  PLT 432* 345 455* 499* 405*   Basic Metabolic Panel: Recent Labs  Lab 11/02/20 0025 11/04/20 0150 11/05/20 0023 11/06/20 0146 11/08/20  0136  NA 136 137 132* 136 136  K  4.8 3.8 4.3 3.9 4.1  CL 104 106 101 105 107  CO2 25 26 21* 26 21*  GLUCOSE 206* 234* 250* 54* 196*  BUN 18 12 13 12 13   CREATININE 0.95 0.68 0.70 0.71 0.79  CALCIUM 7.6* 7.5* 7.3* 7.6* 7.7*  MG  --  1.7  --   --  2.0  PHOS  --  2.1*  --   --  2.8   GFR: Estimated Creatinine Clearance: 78.4 mL/min (by C-G formula based on SCr of 0.79 mg/dL). Liver Function Tests: Recent Labs  Lab 11/02/20 0025 11/04/20 0150 11/05/20 0023 11/06/20 0146  AST 22 22 24 20   ALT 10 8 10 9   ALKPHOS 192* 190* 215* 242*  BILITOT 0.7 0.6 0.5 0.6  PROT 6.9 6.9 7.0 7.0  ALBUMIN 1.0* 1.0* 1.1* 1.1*   No results for input(s): LIPASE, AMYLASE in the last 168 hours. No results for input(s): AMMONIA in the last 168 hours. Coagulation Profile: No results for input(s): INR, PROTIME in the last 168 hours. Cardiac Enzymes: No results for input(s): CKTOTAL, CKMB, CKMBINDEX, TROPONINI in the last 168 hours. BNP (last 3 results) No results for input(s): PROBNP in the last 8760 hours. HbA1C: No results for input(s): HGBA1C in the last 72 hours. CBG: Recent Labs  Lab 11/07/20 0452 11/07/20 0746 11/07/20 1255 11/07/20 1639 11/07/20 2155  GLUCAP 173* 163* 135* 171* 163*   Lipid Profile: No results for input(s): CHOL, HDL, LDLCALC, TRIG, CHOLHDL, LDLDIRECT in the last 72 hours. Thyroid Function Tests: No results for input(s): TSH, T4TOTAL, FREET4, T3FREE, THYROIDAB in the last 72 hours. Anemia Panel: No results for input(s): VITAMINB12, FOLATE, FERRITIN, TIBC, IRON, RETICCTPCT in the last 72 hours. Sepsis Labs: No results for input(s): PROCALCITON, LATICACIDVEN in the last 168 hours.  Recent Results (from the past 240 hour(s))  Culture, Urine     Status: Abnormal   Collection Time: 11/02/20  5:08 AM   Specimen: Urine, Random  Result Value Ref Range Status   Specimen Description URINE, RANDOM  Final   Special Requests   Final    NONE Performed at [facility], [address], [place],  [facility] [address]    Culture 40,000 COLONIES/mL YEAST (A)  Final   Report Status 11/03/2020 FINAL  Final     RN Pressure Injury Documentation: Pressure Injury 08/07/20 Buttocks Right;Medial;Left Stage 2 -  Partial thickness loss of dermis presenting as a shallow open injury with a red, pink wound bed without slough. (Active)  08/07/20 1914  Location: Buttocks  Location Orientation: Right;Medial;Left  Staging: Stage 2 -  Partial thickness loss of dermis presenting as a shallow open injury with a red, pink wound bed without slough.  Wound Description (Comments):   Present on Admission: Yes     Pressure Injury 11/02/20 Penis (Active)  11/02/20 0344  Location: Penis  Location Orientation:   Staging:   Wound Description (Comments):   Present on Admission:      Estimated body mass index is 21.77 kg/m as calculated from the following:   Height as of this encounter: 6' (1.829 m).   Weight as of this encounter: 72.8 kg.  Malnutrition Type:  Nutrition Problem: Severe Malnutrition Etiology: chronic illness (dementia)   Malnutrition Characteristics:  Signs/Symptoms: severe fat depletion,severe muscle depletion,percent weight loss (15.4% weight loss in 9 months) Percent weight loss: 15.4 % (less than 9 months)   Nutrition Interventions:  Interventions: Glucerna shake,MVI,Magic  cup,Calorie Count     Radiology Studies: No results found.  Scheduled Meds: . brimonidine  1 drop Both Eyes TID  . Chlorhexidine Gluconate Cloth  6 each Topical Daily  . diltiazem  60 mg Per Tube Q8H  . feeding supplement (GLUCERNA SHAKE)  237 mL Oral TID BM  . heparin injection (subcutaneous)  5,000 Units Subcutaneous Q8H  . insulin aspart  0-5 Units Subcutaneous QHS  . insulin aspart  0-9 Units Subcutaneous TID WC  . latanoprost  1 drop Both Eyes QHS  . metoprolol tartrate  50 mg Per Tube BID  . multivitamin with minerals  1 tablet Oral Daily  . mupirocin ointment   Topical BID  . pantoprazole sodium   40 mg Per Tube QHS  . rifampin  300 mg Per Tube Q12H  . sodium chloride flush  5 mL Intracatheter Q8H   Continuous Infusions: . sodium chloride 250 mL (11/01/20 2145)  .  ceFAZolin (ANCEF) IV 2 g (11/08/20 0519)    LOS: 18 days   Merlene Laughter, DO [facility] PAGER is on AMION  If 7PM-7AM, please contact night-coverage [email or web address]

## 2020-11-09 LAB — COMPREHENSIVE METABOLIC PANEL
ALT: 7 U/L (ref 0–44)
AST: 18 U/L (ref 15–41)
Albumin: 1.2 g/dL — ABNORMAL LOW (ref 3.5–5.0)
Alkaline Phosphatase: 263 U/L — ABNORMAL HIGH (ref 38–126)
Anion gap: 7 (ref 5–15)
BUN: 11 mg/dL (ref 8–23)
CO2: 23 mmol/L (ref 22–32)
Calcium: 7.9 mg/dL — ABNORMAL LOW (ref 8.9–10.3)
Chloride: 110 mmol/L (ref 98–111)
Creatinine, Ser: 0.88 mg/dL (ref 0.61–1.24)
GFR, Estimated: >60 mL/min (ref >60)
Glucose, Bld: 303 mg/dL — ABNORMAL HIGH (ref 70–99)
Potassium: 4 mmol/L (ref 3.5–5.1)
Sodium: 140 mmol/L (ref 135–145)
Total Bilirubin: 0.4 mg/dL (ref 0.3–1.2)
Total Protein: 7.9 g/dL (ref 6.5–8.1)

## 2020-11-09 LAB — PHOSPHORUS: Phosphorus: 2.7 mg/dL (ref 2.5–4.6)

## 2020-11-09 LAB — CBC WITH DIFFERENTIAL/PLATELET
Abs Immature Granulocytes: 0.02 10*3/uL (ref 0.00–0.07)
Basophils Absolute: 0.1 10*3/uL (ref 0.0–0.1)
Basophils Relative: 1 %
Eosinophils Absolute: 0.6 10*3/uL — ABNORMAL HIGH (ref 0.0–0.5)
Eosinophils Relative: 10 %
HCT: 26 % — ABNORMAL LOW (ref 39.0–52.0)
Hemoglobin: 9 g/dL — ABNORMAL LOW (ref 13.0–17.0)
Immature Granulocytes: 0 %
Lymphocytes Relative: 25 %
Lymphs Abs: 1.4 10*3/uL (ref 0.7–4.0)
MCH: 27.3 pg (ref 26.0–34.0)
MCHC: 34.6 g/dL (ref 30.0–36.0)
MCV: 78.8 fL — ABNORMAL LOW (ref 80.0–100.0)
Monocytes Absolute: 0.6 10*3/uL (ref 0.1–1.0)
Monocytes Relative: 12 %
Neutro Abs: 2.8 10*3/uL (ref 1.7–7.7)
Neutrophils Relative %: 52 %
Platelets: 531 10*3/uL — ABNORMAL HIGH (ref 150–400)
RBC: 3.3 MIL/uL — ABNORMAL LOW (ref 4.22–5.81)
RDW: 20.5 % — ABNORMAL HIGH (ref 11.5–15.5)
WBC: 5.4 10*3/uL (ref 4.0–10.5)
nRBC: 0 % (ref 0.0–0.2)

## 2020-11-09 LAB — GLUCOSE, CAPILLARY
Glucose-Capillary: 236 mg/dL — ABNORMAL HIGH (ref 70–99)
Glucose-Capillary: 316 mg/dL — ABNORMAL HIGH (ref 70–99)
Glucose-Capillary: 177 mg/dL — ABNORMAL HIGH (ref 70–99)
Glucose-Capillary: 306 mg/dL — ABNORMAL HIGH (ref 70–99)

## 2020-11-09 LAB — MAGNESIUM: Magnesium: 2 mg/dL (ref 1.7–2.4)

## 2020-11-09 NOTE — Progress Notes (Signed)
PROGRESS NOTE    Marcus Downs.  FTD:[id number] DOB: May 09, 1941 DOA: 10-30-20 PCP: [name], MD   Brief Narrative:  Patient is a 79 year old African-American male with a past medical history significant for but not limited to vascular dementia, previous CVA with residual left-sided hemiplegia, essential hypertension, type 2 diabetes mellitus, dysphagia and hyperlipidemia as well as chronic urine retention with indwelling Foley catheter secondary to BPH and recent left femoral neck fracture status post left hemiarthroplasty on 08/08/2020 who presented from his nursing facility with altered mental status, agitation and acute kidney injury with electrolyte abnormalities with hyponatremia.  He was in septic shock and found to have an abscess around his arthroplasty hardware and an MSSA bacteremia and was deemed not a surgical candidate.  He was empirically treated with IV antibiotics for his MSSA infection and ended up having a left hip drain. He was transferred back to [facility] service 10/24/20. Overall his prognosis and clinical status remained poor and my colleagues have had multiple conversations with family about palliative care or hospice and family not ready yet.  On 11/08/2020 is pain to tube was removed and he was eating about 50% of his meals but this is not been adequately documented.  We will ask for a calorie count to see if the patient actually nutrition orally.  If he does not he may be hospice appropriate.  Palliative care following appreciate continue goals of care discussion.  11/09/20: No awake enough to participate in Examination and so ? How much he is really taking in orally. IR evaluated and patient's left hip abscess drain is intact and there can continue to follow and recommending a repeat CT scan when his output is minimal and less than 10 cc daily.   Assessment & Plan:   Principal Problem:   MSSA bacteremia Active Problems:   Protein-calorie malnutrition, severe (HCC)    Encounter for nasogastric (NG) tube placement   Severe sepsis (HCC)   Abscess of left hip   Hypernatremia   Abscess   Acute kidney injury (nontraumatic) (HCC)   Dehydration   Elevated liver function tests   Prosthetic hip infection (HCC)   Hyperkalemia   MSSA bacteremia/sepsis secondary to MSSA left hip abscess with septic shock: Prosthetic hip infection. poA Catheter Associated UTI -Treated with vasopressors and weaned off. -Blood cultures 12/4, MSSA bacteremia -Hip abscess 12/4, MSSA -Urine culture 12/4, MSSA -Repeat blood cultures negative.  Echocardiogram without any vegetation.   -Not a good candidate for operative removal/spacer or multistage surgery. -IR guided abscess drain, continues to drain.  Overall with poor clinical improvement. -Currently remains on cefazolin and rifampicin.  Total 6 weeks of antibiotics planned. -Antibiotic end date 12/03/2020. -IR evaluated and recommending repeating CT scanning when his output from his drain is minimal less than 10 cc/day -Oral intake was initially better for last 24 hours after removing NG tube but has declined again and he is not awake enough this morning. -Plan was to transfer to nursing home in next 24 to 48 hours if he is able to eat at least 50% of his meals but calorie count is inaccurate and will need to ensure that patient is adequately meeting his needs; if he is not tolerating p.o. left need to reevaluate goals of care discussion and he may be hospice appropriate as NG tube will not be replaced and PEG tube is not desired.  SVT and Sinus Tachycardia -Improving.  Treated with Cardizem.   -Now on Cardizem 60 mg po q8h and  Metoprolol Tartrate 50 mg po BID and IV Metoprolol 2.5 IV q6hprn for HR>120 Sustained -Currently HR acceptable ranging from from 100-105.   -TSH 1.6.  Acute metabolic encephalopathy in the setting of history of stroke and underlying vascular dementia -Gradual worsening of the mental status today he is  not awake enough to participate in examination.   -Repeat MRI with no evidence of acute process.  Does have history of vascular dementia.   -Followed by palliative.  -Hospice recommended, family declined and could not make decisions. 12/20, patient started eating some but was not awake enough to take anything orally today.  Dobbhoff tube discontinued. -Initially he has had some improvement in mental status, however overall remains in very debilitated state and today he is not awake enough on my examination.  His albumin is 1.1 on last check and today it was 1.2 -With calorie count and see how the patient tolerates oral intake if he does  Hypocalcemia -Patient's calcium was 7.7 to 7.9 after repletion yesterday -Albumin not checked for corrected calcium -Replete with IV calcium gluconate 1 g -We will continue to monitor and trend and repeat CMP in a.m.  Thrombocytosis -Likely reactive  -Patient's platelet count went from 455 -> 499 -> 405 and is now worsened to 531 -Continue Monitor and Trend -Repeat CBC in the AM  Microcytic Anemia -Patient's Hgb/Hct went from 8.7/25.6 -> 9.5/28.5 -> 9.6/27.4 -> 9.0/26.0 and is relatively Stable -Check Anemia Panel in the AM -Continue to Monitor for S/Sx of Bleeding; Currently no overt bleeding noted -Repeat CBC in the AM  GERD/GI Prophylaxis -C/w Pantoprazole 40 mg po Daily   Acute Kidney Injury with Hypernatremia, improved  -Sodium level improved with increasing free water flush.   -Renal function and sodium have both normalized. -Patient's sodium this morning was 140 and patient's BUNs/creatinine is now 11/0.88 -Continue to monitor and trend and repeat CMP in a.m.  Type 2 Diabetes, uncontrolled with Hyperglycemia -> Hypoglycemia -HbA1c 8.1.  -On 12/20, hypoglycemic episodes after stopping tube feeding and continuing on long-acting insulin. -Decrease dose of long-acting insulin, sliding scale insulin to sensitive scale. He is now on Sensitive  Novolog SSI AC/HS -If he is not eating well, he will not need that much insulin. -CBGs ranging from 139-334; Continue to monitor and adjust blood sugars as necessary  Dysphagia -Remained with poor mentation again today.  Initially was eating with help but his oral intake is not being properly documented.   -He does not want a PEG tube  -No reinsertion of Dobbhoff tube recommended.   -We can see next few days and continue to have palliative discussion and if he is not improving may be hospice appropriate  Pressure Injury - Pressure Injury 08/07/20 Buttocks Right;Medial;Left Stage 2 -  Partial thickness loss of dermis presenting as a shallow open injury with a red, pink wound bed without slough. (Active)  08/07/20 1914  Location: Buttocks  Location Orientation: Right;Medial;Left  Staging: Stage 2 -  Partial thickness loss of dermis presenting as a shallow open injury with a red, pink wound bed without slough.  Wound Description (Comments):   Present on Admission: Yes     Pressure Injury 11/02/20 Penis (Active)  11/02/20 0344  Location: Penis (distal meatus)  Location Orientation:   Staging:   Wound Description (Comments):   Present on Admission:    Severe Malnutrition in the Context of Chronic Illness -Estimated body mass index is 21.08 kg/m as calculated from the following:   Height as of this  encounter: 6' (1.829 m).   Weight as of this encounter: 70.5 kg.  -Nutrition Problem: Severe Malnutrition -Etiology: chronic illness (Dementia) -Signs/Symptoms: severe fat depletion,severe muscle depletion,percent weight loss (15.4% weight loss in 9 months) -Percent weight loss: 15.4 % (less than 9 months) -Interventions: Tube feeding,Prostat,MVI; TF now discontinued and Panda Tube Removed -Nutritionist consulted for further evaluation and recommendations -C/w 48 hour Calorie Count but she has not been keeping track of his meals appropriately so we will need to extend this out given the  very limited no documentation abdomen completed by nursing staff.  Nutrition was stressing the importance of keeping accurate documentation and it is unclear if the patient is consuming his supplements well.  Will need to make an informed decision of whether he is able to adequately meet his nutritional needs orally we will need to have continued goals of care discussion and possible transfer to hospice if he does not -Nutritionist recommending c/w Glucerna Shake po TID, Magic Cup TIDwm, and MVI Daily; stressed importance of obtaining a calorie count -Results of Calorie Count still pending   DVT prophylaxis: Heparin 5,000 units sq q8h Code Status: DO NOT RESUSCITATE  Family Communication: No family present at bedside  Disposition Plan: Anticipating D/C to SNF if able to tolerate po Intake adequately vs Hospice Appropriate  Status is: Inpatient  Remains inpatient appropriate because:Unsafe d/c plan, IV treatments appropriate due to intensity of illness or inability to take PO and Inpatient level of care appropriate due to severity of illness   Dispo:  Patient From: Skilled Nursing Facility  Planned Disposition: Skilled Nursing Facility  Expected discharge date: 11/10/2020  Medically stable for discharge: No  Consultants:   Palliative Care  Infectious Diseases  Interventional Radiology  PCCM Transfer  Orthopedic Surgery   Procedures:  Left hip abscess s/p abscess drain to the left thigh on 11/10/20  Antimicrobials: Anti-infectives (From admission, onward)   Start     Dose/Rate Route Frequency Ordered Stop   11/08/20 2200  rifampin (RIFADIN) capsule 300 mg  Status:  Discontinued        300 mg Oral Every 12 hours 11/08/20 1319 11/08/20 1322   11/08/20 2200  rifampin (RIFADIN) 25 mg/mL oral suspension 300 mg        300 mg Oral Every 12 hours 11/08/20 1322     11/06/20 2200  rifampin (RIFADIN) 25 mg/mL oral suspension 300 mg  Status:  Discontinued        300 mg Per Tube Every 12  hours 11/06/20 1008 11/08/20 1319   10/26/20 1100  rifampin (RIFADIN) 300 mg in sodium chloride 0.9 % 100 mL IVPB  Status:  Discontinued        300 mg 200 mL/hr over 30 Minutes Intravenous Every 12 hours 10/26/20 1007 11/06/20 1008   10/24/20 0815  ceFAZolin (ANCEF) IVPB 2g/100 mL premix        2 g 200 mL/hr over 30 Minutes Intravenous Every 8 hours 10/24/20 0804     10/23/20 2200  ceFAZolin (ANCEF) IVPB 2g/100 mL premix  Status:  Discontinued        2 g 200 mL/hr over 30 Minutes Intravenous Every 12 hours 10/23/20 1406 10/24/20 0804   10/22/20 1000  ceFAZolin (ANCEF) IVPB 1 g/50 mL premix  Status:  Discontinued        1 g 100 mL/hr over 30 Minutes Intravenous Every 12 hours 11/10/20 1745 10/23/20 1406   11/10/2020 2200  ceFAZolin (ANCEF) IVPB 2g/100 mL premix  2 g 200 mL/hr over 30 Minutes Intravenous  Once 11-10-2020 1739 November 10, 2020 2239   11/10/2020 2100  cefTRIAXone (ROCEPHIN) 2 g in sodium chloride 0.9 % 100 mL IVPB  Status:  Discontinued        2 g 200 mL/hr over 30 Minutes Intravenous Every 24 hours 2020/11/10 1117 11/10/20 1129   11-10-2020 1730  linezolid (ZYVOX) IVPB 600 mg  Status:  Discontinued        600 mg 300 mL/hr over 60 Minutes Intravenous Every 12 hours 11-10-20 1154 November 10, 2020 1739   11/10/20 1130  cefTRIAXone (ROCEPHIN) 2 g in sodium chloride 0.9 % 100 mL IVPB  Status:  Discontinued        2 g 200 mL/hr over 30 Minutes Intravenous Every 24 hours 2020-11-10 1129 2020-11-10 1739   11/10/20 1000  meropenem (MERREM) 500 mg in sodium chloride 0.9 % 100 mL IVPB  Status:  Discontinued        500 mg 200 mL/hr over 30 Minutes Intravenous Every 12 hours November 10, 2020 0330 11-10-2020 1117   11/10/2020 0400  linezolid (ZYVOX) IVPB 600 mg        600 mg 300 mL/hr over 60 Minutes Intravenous  Once November 10, 2020 0309 11-10-2020 0654   11/10/2020 0215  ceFEPIme (MAXIPIME) 2 g in sodium chloride 0.9 % 100 mL IVPB        2 g 200 mL/hr over 30 Minutes Intravenous  Once 11-10-2020 0212 2020/11/10 0306   10-Nov-2020  0215  ampicillin-sulbactam (UNASYN) 1.5 g in sodium chloride 0.9 % 100 mL IVPB  Status:  Discontinued        1.5 g 200 mL/hr over 30 Minutes Intravenous  Once 11-10-20 0212 2020/11/10 0214   2020/11/10 0215  vancomycin (VANCOCIN) IVPB 1000 mg/200 mL premix  Status:  Discontinued        1,000 mg 200 mL/hr over 60 Minutes Intravenous  Once 2020-11-10 6283 11/10/20 0314        Subjective: Seen and examined at bedside and again he is not able to provide a subjective history given his somnolence and current mental status. He appears calm and comfortable. Still has a drain in place. No nausea or vomiting noted overnight. Interventional radiology evaluated and recommending repeating a CT scan once his output from his drain is less than 10 cc daily. No other concerns or complaints at this time.  Objective: Vitals:   11/08/20 2011 11/09/20 0451 11/09/20 0532 11/09/20 0940  BP: (!) 147/66  (!) 144/75 140/63  Pulse: 100  (!) 105 (!) 101  Resp: 19  20 19   Temp: 98.2 F (36.8 C)  98.2 F (36.8 C) 98 F (36.7 C)  TempSrc: Oral  Axillary Axillary  SpO2: 100%  100% 100%  Weight:  70.5 kg    Height:        Intake/Output Summary (Last 24 hours) at 11/09/2020 1123 Last data filed at 11/09/2020 0533 Gross per 24 hour  Intake 500 ml  Output 440 ml  Net 60 ml   Filed Weights   11/02/20 0825 11/05/20 0500 11/09/20 0451  Weight: 71.5 kg 72.8 kg 70.5 kg   Examination: Physical Exam:  Constitutional: Patient is a thin chronically ill-appearing African-American male appears in no acute distress but is very somnolent and does not interact or follow commands and is unable to provide a subjective history given his current condition Eyes: Lids normal  ENMT: External Ears, Nose appear normal. Neck: Appears normal, supple, no cervical masses, normal ROM, no appreciable thyromegaly; no JVD  Respiratory: Diminished to auscultation bilaterally with coarse breath sounds, no wheezing, rales, rhonchi or crackles.  Normal respiratory effort and patient is not tachypenic. No accessory muscle use. Unlabored breathing and not wearing supplemental oxygen via nasal cannula Cardiovascular: RRR, no murmurs / rubs / gallops. S1 and S2 auscultated. Very minimal extremity edema Abdomen: Soft, non-tender, non-distended. Bowel sounds positive.  GU: Deferred. Foley catheter in place the Musculoskeletal: No clubbing / cyanosis of digits/nails. Has a drain in the left hip Skin: No rashes, lesions, ulcers on limited skin evaluation. No induration; Warm and dry.  Neurologic: Patient is not awake enough and does not follow commands again today Psychiatric: Continues to have impaired judgment and insight. He is not awake or alert and remains somnolent but he is calm.  Data Reviewed: I have personally reviewed following labs and imaging studies  CBC: Recent Labs  Lab 11/05/20 0023 11/06/20 0146 11/07/20 0132 11/08/20 0136 11/09/20 0236  WBC 6.4 6.8 7.4 5.5 5.4  NEUTROABS 4.3 3.7 4.9 2.9 2.8  HGB 8.9* 8.7* 9.5* 9.6* 9.0*  HCT 25.5* 25.6* 28.5* 27.4* 26.0*  MCV 77.7* 77.8* 79.6* 76.8* 78.8*  PLT 345 455* 499* 405* 531*   Basic Metabolic Panel: Recent Labs  Lab 11/04/20 0150 11/05/20 0023 11/06/20 0146 11/08/20 0136 11/09/20 0236  NA 137 132* 136 136 140  K 3.8 4.3 3.9 4.1 4.0  CL 106 101 105 107 110  CO2 26 21* 26 21* 23  GLUCOSE 234* 250* 54* 196* 303*  BUN 12 13 12 13 11   CREATININE 0.68 0.70 0.71 0.79 0.88  CALCIUM 7.5* 7.3* 7.6* 7.7* 7.9*  MG 1.7  --   --  2.0 2.0  PHOS 2.1*  --   --  2.8 2.7   GFR: Estimated Creatinine Clearance: 69 mL/min (by C-G formula based on SCr of 0.88 mg/dL). Liver Function Tests: Recent Labs  Lab 11/04/20 0150 11/05/20 0023 11/06/20 0146 11/09/20 0236  AST 22 24 20 18   ALT 8 10 9 7   ALKPHOS 190* 215* 242* 263*  BILITOT 0.6 0.5 0.6 0.4  PROT 6.9 7.0 7.0 7.9  ALBUMIN 1.0* 1.1* 1.1* 1.2*   No results for input(s): LIPASE, AMYLASE in the last 168 hours. No  results for input(s): AMMONIA in the last 168 hours. Coagulation Profile: No results for input(s): INR, PROTIME in the last 168 hours. Cardiac Enzymes: No results for input(s): CKTOTAL, CKMB, CKMBINDEX, TROPONINI in the last 168 hours. BNP (last 3 results) No results for input(s): PROBNP in the last 8760 hours. HbA1C: No results for input(s): HGBA1C in the last 72 hours. CBG: Recent Labs  Lab 11/08/20 0800 11/08/20 1140 11/08/20 1558 11/08/20 2050 11/09/20 0731  GLUCAP 182* 139* 239* 334* 236*   Lipid Profile: No results for input(s): CHOL, HDL, LDLCALC, TRIG, CHOLHDL, LDLDIRECT in the last 72 hours. Thyroid Function Tests: No results for input(s): TSH, T4TOTAL, FREET4, T3FREE, THYROIDAB in the last 72 hours. Anemia Panel: No results for input(s): VITAMINB12, FOLATE, FERRITIN, TIBC, IRON, RETICCTPCT in the last 72 hours. Sepsis Labs: No results for input(s): PROCALCITON, LATICACIDVEN in the last 168 hours.  Recent Results (from the past 240 hour(s))  Culture, Urine     Status: Abnormal   Collection Time: 11/02/20  5:08 AM   Specimen: Urine, Random  Result Value Ref Range Status   Specimen Description URINE, RANDOM  Final   Special Requests   Final    NONE Performed at [facility], [address], [place], [facility] [address]  Culture 40,000 COLONIES/mL YEAST (A)  Final   Report Status 11/03/2020 FINAL  Final     RN Pressure Injury Documentation: Pressure Injury 08/07/20 Buttocks Right;Medial;Left Stage 2 -  Partial thickness loss of dermis presenting as a shallow open injury with a red, pink wound bed without slough. (Active)  08/07/20 1914  Location: Buttocks  Location Orientation: Right;Medial;Left  Staging: Stage 2 -  Partial thickness loss of dermis presenting as a shallow open injury with a red, pink wound bed without slough.  Wound Description (Comments):   Present on Admission: Yes     Pressure Injury 11/02/20 Penis (Active)  11/02/20 0344  Location:  Penis  Location Orientation:   Staging:   Wound Description (Comments):   Present on Admission:      Estimated body mass index is 21.08 kg/m as calculated from the following:   Height as of this encounter: 6' (1.829 m).   Weight as of this encounter: 70.5 kg.  Malnutrition Type:  Nutrition Problem: Severe Malnutrition Etiology: chronic illness (dementia)   Malnutrition Characteristics:  Signs/Symptoms: severe fat depletion,severe muscle depletion,percent weight loss (15.4% weight loss in 9 months) Percent weight loss: 15.4 % (less than 9 months)   Nutrition Interventions:  Interventions: Glucerna shake,MVI,Magic cup,Calorie Count     Radiology Studies: No results found.  Scheduled Meds: . brimonidine  1 drop Both Eyes TID  . Chlorhexidine Gluconate Cloth  6 each Topical Daily  . diltiazem  60 mg Oral Q8H  . feeding supplement (GLUCERNA SHAKE)  237 mL Oral TID BM  . heparin injection (subcutaneous)  5,000 Units Subcutaneous Q8H  . insulin aspart  0-5 Units Subcutaneous QHS  . insulin aspart  0-9 Units Subcutaneous TID WC  . latanoprost  1 drop Both Eyes QHS  . metoprolol tartrate  50 mg Oral BID  . multivitamin with minerals  1 tablet Oral Daily  . mupirocin ointment   Topical BID  . pantoprazole sodium  40 mg Oral Daily  . rifampin  300 mg Oral Q12H  . sodium chloride flush  5 mL Intracatheter Q8H   Continuous Infusions: . sodium chloride 250 mL (11/01/20 2145)  .  ceFAZolin (ANCEF) IV 2 g (11/09/20 0951)    LOS: 19 days   Merlene Laughtermair Latif Gertha Lichtenberg, DO [facility] is on AMION  If 7PM-7AM, please contact night-coverage [email or web address]

## 2020-11-09 NOTE — Progress Notes (Signed)
Referring Physician(s): Dr Corrie Mckusick Dr De Burrs  Supervising Physician: Richarda Overlie  Patient Status:  [facility] - In-pt  Chief Complaint:  Left hip abscess Drain placed in IR 12/4  Subjective:  OP remains high 90 cc yesterday Resting comfortably in bed  Drain flushes easily  Allergies: Tape  Medications: Prior to Admission medications   Medication Sig Start Date End Date Taking? Authorizing Provider  acetaminophen (TYLENOL) 325 MG tablet Take 650 mg by mouth every 6 (six) hours as needed for mild pain or fever. 08/14/20  Yes [provider]  Amino Acids-Protein Hydrolys (FEEDING SUPPLEMENT, PRO-STAT 64,) LIQD Take 30 mLs by mouth in the morning and at bedtime. For wound healing 08/15/20  Yes [provider]  aspirin 325 MG tablet Take 325 mg by mouth daily.   Yes [provider]  atorvastatin (LIPITOR) 40 MG tablet Take 1 tablet (40 mg total) by mouth daily. 08/14/20  Yes Rhetta Mura, MD  bisacodyl (DULCOLAX) 10 MG suppository Place 10 mg rectally as needed for moderate constipation.   Yes [provider]  brimonidine (ALPHAGAN P) 0.1 % SOLN Place 1 drop into both eyes 3 (three) times daily.   Yes [provider]  ferrous sulfate 325 (65 FE) MG tablet Take 325 mg by mouth daily.  08/31/18  Yes [name], MD  Insulin Aspart Prot & Aspart (NOVOLOG MIX 70/30 FLEXPEN [place]) Inject 14-18 Units into the skin See admin instructions. Inject 14 Units after breakfast,  Inject 18 units after lunch, inject 16 uints at dinner.   Yes [provider]  latanoprost (XALATAN) 0.005 % ophthalmic solution Place 1 drop into both eyes at bedtime.   Yes [provider]  magnesium hydroxide (MILK OF MAGNESIA) 400 MG/5ML suspension Take 30 mLs by mouth daily as needed for mild constipation.    Yes [provider]  metoprolol tartrate (LOPRESSOR) 25 MG tablet Take 1 tablet (25 mg total) by mouth 2 (two) times daily. 08/14/20   Yes Rhetta Mura, MD  MULTIPLE VITAMIN PO Take 1 tablet by mouth daily. W/ Minerals to promote wound healing   Yes [provider]  NON FORMULARY Medpass TID d/t weight loss. 09/13/20  Yes [provider]  potassium chloride (KLOR-CON) 10 MEQ tablet TAKE 3 TABLETS BY MOUTH DAILY Patient taking differently: 30 mEq daily.  09/11/20  Yes [name], MD  Sodium Phosphates (RA SALINE ENEMA RE) Place 1 enema rectally daily as needed (for constipation).   Yes [provider]  sodium chloride 0.9 % infusion Inject 1,000 mLs into the vein daily. 252ml/ 1 hour    [provider]  sulfamethoxazole-trimethoprim (BACTRIM DS) 800-160 MG tablet Take 1 tablet by mouth 2 (two) times daily. For 10 days    [provider]  sulfamethoxazole-trimethoprim (BACTRIM) 400-80 MG tablet Take 1 tablet by mouth 2 (two) times daily. For 10 days    [provider]     Vital Signs: BP (!) 144/75 (BP Location: Left Arm)   Pulse (!) 105   Temp 98.2 F (36.8 C) (Axillary)   Resp 20   Ht 6' (1.829 m)   Wt 155 lb 6.8 oz (70.5 kg)   SpO2 100%   BMI 21.08 kg/m   Physical Exam Skin:    General: Skin is warm.     Comments: Site of drain is c/d/i No bleeding No infection OP significant - 90 cc yesterday Flushes easily OP serous color     Imaging: No  results found.  Labs:  CBC: Recent Labs    11/06/20 0146 11/07/20 0132 11/08/20 0136 11/09/20 0236  WBC 6.8 7.4 5.5 5.4  HGB 8.7* 9.5* 9.6* 9.0*  HCT 25.6* 28.5* 27.4* 26.0*  PLT 455* 499* 405* 531*    COAGS: Recent Labs    11-12-2020 0100  INR 1.4*  APTT 42*    BMP: Recent Labs    08/12/20 0540 08/13/20 0548 08/13/20 0548 08/21/20 0000 09/25/20 0000 11-12-20 0100 11/05/20 0023 11/06/20 0146 11/08/20 0136 11/09/20 0236  NA 146* 142   < > 138 142   < > 132* 136 136 140  K 3.8 3.9  --  4.7 4.3   < > 4.3 3.9 4.1 4.0  CL 108 110  --  102 100   < > 101 105 107 110   CO2 26 22  --  25* 23*   < > 21* 26 21* 23  GLUCOSE 221* 161*  --   --   --    < > 250* 54* 196* 303*  BUN 25* 24*   < > 10 9   < > 13 12 13 11   CALCIUM 7.9* 7.6*  --  8.5* 9.7   < > 7.3* 7.6* 7.7* 7.9*  CREATININE 1.34* 1.04   < > 0.8 1.1   < > 0.70 0.71 0.79 0.88  GFRNONAA 50* >60  --  83.93 62.96   < > >60 >60 >60 >60  GFRAA 58* >60  --  >90 72.97  --   --   --   --   --    < > = values in this interval not displayed.    LIVER FUNCTION TESTS: Recent Labs    11/04/20 0150 11/05/20 0023 11/06/20 0146 11/09/20 0236  BILITOT 0.6 0.5 0.6 0.4  AST 22 24 20 18   ALT 8 10 9 7   ALKPHOS 190* 215* 242* 263*  PROT 6.9 7.0 7.0 7.9  ALBUMIN 1.0* 1.1* 1.1* 1.2*    Assessment and Plan:  Left hip abscess drain intact Will follow Will need re CT when OP is minimal (10 cc daily)   Electronically Signed: 11/11/20, PA-C 11/09/2020, 7:19 AM   I spent a total of 15 Minutes at the the patient's bedside AND on the patient's hospital floor or unit, greater than 50% of which was counseling/coordinating care for Left hip abscess drain

## 2020-11-09 NOTE — Progress Notes (Addendum)
Calorie Count Note  48 hour calorie count ordered.  Diet: Dysphagia 1 with thin liquids  Supplements: Glucerna shakes po TID, Magic Cup po TID  Day 1 Results (11/07/20): Breakfast: no meal documentation available Lunch: 159 kcals, 11 grams of protein Dinner: no meal documentation available Supplements: 290 kcals, 9 grams of protein  Total intake: 449 kcal (23% of minimum estimated needs)  20g protein (21% of minimum estimated needs)  Unable to speak towards consumption of Glucerna for this day due to lack of RN documentation.   Day 2 Results (11/08/20) Breakfast: 50 kcals, 0 grams of protein Lunch: 35 kcals, 0 grams of protein Dinner: 27 kcals, 2 grams of protein Supplements: 1090 kcals, 37 grams of protein  Total intake: 1202 kcal (63% of minimum estimated needs)  39 grams of protein (41% of minimum estimated needs)  Pt doing well with Magic Cup and Glucerna consumption. Discussed pt with NP. NP would like to continue calorie count another day. PT must establish adequate PO intake before he can d/c. RD will follow-up with results from 12/23 tomorrow.    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  Goal: Patient will meet greater than or equal to 90% of their needs  Intervention:  Continue calorie count per NP  Continue Glucerna Shake po TID, each supplement provides 220 kcal and 10 grams of protein  Continue Magic cup TID with meals, each supplement provides 290 kcal and 9 grams of protein  Continue MVI daily   Eugene Gavia, MS, RD, LDN RD pager number and weekend/on-call pager number located in Amion.

## 2020-11-09 NOTE — Progress Notes (Signed)
Inpatient Diabetes Program Recommendations  AACE/ADA: New Consensus Statement on Inpatient Glycemic Control (2015)  Target Ranges:  Prepandial:   less than 140 mg/dL      Peak postprandial:   less than 180 mg/dL (1-2 hours)      Critically ill patients:  140 - 180 mg/dL   Lab Results  Component Value Date   GLUCAP 236 (H) 11/09/2020   HGBA1C 8.1 (H) 11/10/2020    Review of Glycemic Control Results for LJ, MIYAMOTO (MRN [id number]) as of 11/09/2020 11:33  Ref. Range 11/08/2020 08:00 11/08/2020 11:40 11/08/2020 15:58 11/08/2020 20:50 11/09/2020 07:31  Glucose-Capillary Latest Ref Range: 70 - 99 mg/dL 206 (H) 015 (H) 615 (H) 334 (H) 236 (H)    Inpatient Diabetes Program Recommendations:    If CBG's remain elevated might consider Lantus 10 units daily.  Noted was low with 22 units on 12/20 and Lantus was discontinued.    Will continue to follow while inpatient.  Thank you, Dulce Sellar, RN, BSN Diabetes Coordinator Inpatient Diabetes Program [phone] (team pager from 8a-5p)

## 2020-11-10 LAB — COMPREHENSIVE METABOLIC PANEL
ALT: 9 U/L (ref 0–44)
AST: 18 U/L (ref 15–41)
Albumin: 1.3 g/dL — ABNORMAL LOW (ref 3.5–5.0)
Alkaline Phosphatase: 305 U/L — ABNORMAL HIGH (ref 38–126)
Anion gap: 10 (ref 5–15)
BUN: 11 mg/dL (ref 8–23)
CO2: 24 mmol/L (ref 22–32)
Calcium: 8.1 mg/dL — ABNORMAL LOW (ref 8.9–10.3)
Chloride: 110 mmol/L (ref 98–111)
Creatinine, Ser: 1 mg/dL (ref 0.61–1.24)
GFR, Estimated: >60 mL/min (ref >60)
Glucose, Bld: 354 mg/dL — ABNORMAL HIGH (ref 70–99)
Potassium: 3.9 mmol/L (ref 3.5–5.1)
Sodium: 144 mmol/L (ref 135–145)
Total Bilirubin: 0.7 mg/dL (ref 0.3–1.2)
Total Protein: 8.1 g/dL (ref 6.5–8.1)

## 2020-11-10 LAB — IRON AND TIBC
Iron: 25 ug/dL — ABNORMAL LOW (ref 45–182)
Saturation Ratios: 19 % (ref 17.9–39.5)
TIBC: 133 ug/dL — ABNORMAL LOW (ref 250–450)
UIBC: 108 ug/dL

## 2020-11-10 LAB — CBC WITH DIFFERENTIAL/PLATELET
Abs Immature Granulocytes: 0.03 10*3/uL (ref 0.00–0.07)
Basophils Absolute: 0 10*3/uL (ref 0.0–0.1)
Basophils Relative: 1 %
Eosinophils Absolute: 0.4 10*3/uL (ref 0.0–0.5)
Eosinophils Relative: 7 %
HCT: 26.4 % — ABNORMAL LOW (ref 39.0–52.0)
Hemoglobin: 9.3 g/dL — ABNORMAL LOW (ref 13.0–17.0)
Immature Granulocytes: 1 %
Lymphocytes Relative: 23 %
Lymphs Abs: 1.4 10*3/uL (ref 0.7–4.0)
MCH: 27.6 pg (ref 26.0–34.0)
MCHC: 35.2 g/dL (ref 30.0–36.0)
MCV: 78.3 fL — ABNORMAL LOW (ref 80.0–100.0)
Monocytes Absolute: 0.8 10*3/uL (ref 0.1–1.0)
Monocytes Relative: 13 %
Neutro Abs: 3.4 10*3/uL (ref 1.7–7.7)
Neutrophils Relative %: 55 %
Platelets: 505 10*3/uL — ABNORMAL HIGH (ref 150–400)
RBC: 3.37 MIL/uL — ABNORMAL LOW (ref 4.22–5.81)
RDW: 20.7 % — ABNORMAL HIGH (ref 11.5–15.5)
WBC: 6.1 10*3/uL (ref 4.0–10.5)
nRBC: 0 % (ref 0.0–0.2)

## 2020-11-10 LAB — RETICULOCYTES
Immature Retic Fract: 28.2 % — ABNORMAL HIGH (ref 2.3–15.9)
RBC.: 3.45 MIL/uL — ABNORMAL LOW (ref 4.22–5.81)
Retic Count, Absolute: 76.6 10*3/uL (ref 19.0–186.0)
Retic Ct Pct: 2.2 % (ref 0.4–3.1)

## 2020-11-10 LAB — RESP PANEL BY RT-PCR (FLU A&B, COVID) ARPGX2
Influenza A by PCR: NEGATIVE
Influenza B by PCR: NEGATIVE
SARS Coronavirus 2 by RT PCR: NEGATIVE

## 2020-11-10 LAB — GLUCOSE, CAPILLARY
Glucose-Capillary: 323 mg/dL — ABNORMAL HIGH (ref 70–99)
Glucose-Capillary: 333 mg/dL — ABNORMAL HIGH (ref 70–99)
Glucose-Capillary: 320 mg/dL — ABNORMAL HIGH (ref 70–99)
Glucose-Capillary: 225 mg/dL — ABNORMAL HIGH (ref 70–99)
Glucose-Capillary: 134 mg/dL — ABNORMAL HIGH (ref 70–99)

## 2020-11-10 LAB — VITAMIN B12: Vitamin B-12: 332 pg/mL (ref 180–914)

## 2020-11-10 LAB — MAGNESIUM: Magnesium: 1.8 mg/dL (ref 1.7–2.4)

## 2020-11-10 LAB — FOLATE: Folate: 7.9 ng/mL (ref >5.9)

## 2020-11-10 LAB — FERRITIN: Ferritin: 559 ng/mL — ABNORMAL HIGH (ref 24–336)

## 2020-11-10 LAB — PHOSPHORUS: Phosphorus: 2.6 mg/dL (ref 2.5–4.6)

## 2020-11-10 MED ORDER — GLUCERNA SHAKE PO LIQD
237.0000 mL | Freq: Three times a day (TID) | ORAL | Status: DC
  Administered 2020-11-10 – 2020-11-18 (×13): 237 mL via ORAL

## 2020-11-10 MED ORDER — METOPROLOL TARTRATE 5 MG/5ML IV SOLN: 5.0000 mg | Freq: Once | INTRAVENOUS | Status: DC

## 2020-11-10 MED ORDER — MIRTAZAPINE 15 MG PO TABS
7.5000 mg | ORAL_TABLET | Freq: Every day | ORAL | Status: DC
  Administered 2020-11-10 – 2020-11-17 (×7): 7.5 mg via ORAL
  Filled 2020-11-10 (×7): qty 1

## 2020-11-10 MED ORDER — INSULIN GLARGINE 100 UNIT/ML ~~LOC~~ SOLN
10.0000 [IU] | Freq: Every day | SUBCUTANEOUS | Status: DC
  Administered 2020-11-10 – 2020-11-18 (×8): 10 [IU] via SUBCUTANEOUS
  Filled 2020-11-10 (×10): qty 0.1

## 2020-11-10 NOTE — Progress Notes (Addendum)
Calorie Count Note  48 hour calorie count ordered.  Diet: Dysphagia 1 with thin liquids  Supplements: Glucerna shakes po TID, Magic Cup po TID  Day 1 Results (11/07/20): Breakfast: no meal documentation available Lunch: 159 kcals, 11 grams of protein Dinner: no meal documentation available Supplements: 290 kcals, 9 grams of protein  Total intake: 449 kcal (23% of minimum estimated needs)  20g protein (21% of minimum estimated needs)  Unable to speak towards consumption of Glucerna for this day due to lack of RN documentation.   Day 2 Results (11/08/20) Breakfast: 50 kcals, 0 grams of protein Lunch: 35 kcals, 0 grams of protein Dinner: 27 kcals, 2 grams of protein Supplements: 1090 kcals, 37 grams of protein  Total intake: 1202 kcal (63% of minimum estimated needs)  39 grams of protein (41% of minimum estimated needs)  Day 3 Results (11/09/20) Breakfast: 144 kcal, 19 grams protein Lunch: 128 kcal, 2 grams protein Dinner: 85 kcal, 11 grams protein Supplements: 892 kcal, 37 grams protein  Total intake: 1249 kcal (65% of minimum estimated needs)  69 grams protein (72% of minimum estimated needs)  Estimated Nutritional Needs:  Kcal:  1900-2100 Protein:  95-115 grams Fluid:  >/= 2.0 L   Pt continues to do well with Magic Cup and Glucerna consumption, though meal consumption is poor. Pt's blood sugar is >300 (Diabetes Coordinator following), so will continue with Glucerna as opposed to the more nutritionally-dense Ensure supplements. Given excellent supplement consumption, will trial a 4th Glucerna for the pt to help meet kcal/protein needs. Per MD, pt must establish adequate PO intake before he can d/c, so recommend continuing calorie count throughout the weekend. RD will follow-up with results on Monday 12/27 if pt remains in the hospital.    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 -- ongoing  Goal: Patient will meet greater than or equal to 90% of their needs -- progressing  Intervention:  Continue calorie count throughout weekend, results to be provided on Monday if pt remains hospitalized   Glucerna Shake po QID, each supplement provides 220 kcal and 10 grams of protein  Continue Magic cup TID with meals, each supplement provides 290 kcal and 9 grams of protein  Continue MVI daily   Eugene Gavia, MS, RD, LDN RD pager number and weekend/on-call pager number located in [place].

## 2020-11-10 NOTE — Progress Notes (Signed)
Palliative:  HPI:79 y.o.malewith past medical history of stroke, vascular dementia, hypertension, hyperlipidemia, diabetes, GERD, dysphagia, chronic urinary retention with indwelling catheter due to BPH, L hemiarthroplasty 09/2021ad[facility] on 12[facility]21[facility] and sepsis.CT A/P found left hip abscess with MSSA bacteremia. Has Cortrak in place with ongoing decreased mental status. Overall prognosis poor. Palliative care requested to continue goals of care conversations with family.  I met today at Mr. Fassnacht bedside but no family present. Discussed with RN and NT. RN reports that he did not eat breakfast but enjoys supplements (Magic Cup and Ensure will accept 100%) but none of breakfast. NT reports pocketing and having to suction out lunch and ate 0% of lunch.   I attempted to call son, Jeneen Rinks, but no answer. Family wishing to continue current treatments. Have not been prepared to de-escalate to comfort care at this time. Unfortunately he has IV antibiotic treatments until December 03, 2020. I worry that he will continue to decline during this time due to poor intake and at risk for further complications. Family have been clear on no escalation if decline and no feeding tube.   I feel Mr. Boisclair could likely d/c to SNF with maximized oral supplements and outpatient palliative support. Will provide trial of appetite stimulant but no further options to improve intake at this time.   Exam: Sleeping comfortably. Oriented to person only. Speech slurred/garbled and difficult to understand. Follows simple commands at times but not consistently. No distress. Breathing regular, unlabored. Generalized weakness and fatigue. Severe malnutrition with muscle wasting.   Plan: - Mirtazapine 7.5 mg qhs trial for appetite stimulant.  - Consider SNF with palliative transition. Hospice only an option if family consider no further IV antibiotic therapy.  - Continue to consider quality of life if decisions and  aggressiveness of care moving forward.   [place], NP Palliative Medicine Team Pager [phone] (Please see [email or web address] for schedule) Team Phone [phone]    Greater than 50%  of this time was spent counseling and coordinating care related to the above assessment and plan

## 2020-11-10 NOTE — Progress Notes (Signed)
PROGRESS NOTE    Marcus LankSamuel J Diamond Jr.  FAO:[id number] DOB: 10/06/1941 DOA: 10/18/2020 PCP: Eustaquio BoydenGutierrez, Javier, MD  Brief Narrative:79 year old African-American male with a past medical history significant for but not limited to vascular dementia, previous CVA with residual left-sided hemiplegia, essential hypertension, type 2 diabetes mellitus, dysphagia and hyperlipidemia as well as chronic urine retention with indwelling Foley catheter secondary to BPH and recent left femoral neck fracture status post left hemiarthroplasty on 08/08/2020 who presented from his nursing facility with altered mental status, agitation and acute kidney injury with electrolyte abnormalities with hyponatremia.  He was in septic shock and found to have an abscess around his arthroplasty hardware and an MSSA bacteremia and was deemed not a surgical candidate.  He was empirically treated with IV antibiotics for his MSSA infection and ended up having a left hip drain. [facility] Overall his prognosis and clinical status remained poor and my colleagues have had multiple conversations with family about palliative care or hospice and family not ready yet.  On 11/08/2020 is pain to tube was removed and he was eating about 50% of his meals but this is not been adequately documented.  We will ask for a calorie count to see if the patient actually nutrition orally.  If he does not he may be hospice appropriate.  Palliative care following appreciate continue goals of care discussion.  Assessment & Plan:   Principal Problem:   MSSA bacteremia Active Problems:   Protein-calorie malnutrition, severe (HCC)   Encounter for nasogastric (NG) tube placement   Severe sepsis (HCC)   Abscess of left hip   Hypernatremia   Abscess   Acute kidney injury (nontraumatic) (HCC)   Dehydration   Elevated liver function tests   Prosthetic hip infection (HCC)   Hyperkalemia   #1 sepsis and septic shock secondary to  MSSA bacteremia and left hip abscess at prosthetic hip infection and catheter associated UTI.  He was treated with vasopressors.  Blood cultures hip abscess and urine culture all grew MSSA.  Repeat blood cultures have been negative.  Echo without any vegetation.  IR guided abscess drain in place.  On cefazolin and rifampin for a total of 6 weeks antibiotic end date is December 03, 2020.  Patient seen by IR recommending CT of the hip once his drain is less than 10 cc/day.  #2 severe malnutrition related to chronic illness/dementia as evidenced by severe fat and muscle depletion poor poor p.o. intake calorie count was ordered and appreciate dietary and I have reviewed their notes.  They recommend continuing calorie count over the weekend and following up on Monday.  Patient needs to eat at least 90% of his meals prior to discharge to skilled nursing facility.   He does not want PEG tube or Dobbhoff tube.  #3 sinus tachycardia on Cardizem and metoprolol improving normal TSH  #4 acute metabolic encephalopathy in the setting of CVA and vascular dementia followed by palliative care.  Patient has not made any significant improvement.  MRI of the brain showed no acute process.  Prior attending had discussed hospice with the family they are not ready to make the patient hospice yet.  #5 GERD continue with Protonix  #6 AKI improving with IV fluids  #7 hyponatremia  #8 type 2 diabetes with uncontrolled hyperglycemia patient was placed on a sensitive sliding scale due to hypoglycemia on 11/06/2020.  Seen by diabetic coordinator this morning recommending Lantus 10 units daily we will start. CBG (last  3)  Recent Labs    11/10/20 0603 11/10/20 0726 11/10/20 1219  GLUCAP 323* 333* 320*     #9 pressure injury as below Pressure Injury 08/07/20 Buttocks Right;Medial;Left Stage 2 -  Partial thickness loss of dermis presenting as a shallow open injury with a red, pink wound bed without slough. (Active)   08/07/20 1914  Location: Buttocks  Location Orientation: Right;Medial;Left  Staging: Stage 2 -  Partial thickness loss of dermis presenting as a shallow open injury with a red, pink wound bed without slough.  Wound Description (Comments):   Present on Admission: Yes     Pressure Injury 11/02/20 Penis (Active)  11/02/20 0344  Location: Penis  Location Orientation:   Staging:   Wound Description (Comments):   Present on Admission:       Nutrition Problem: Severe Malnutrition Etiology: chronic illness (dementia)     Signs/Symptoms: severe fat depletion,severe muscle depletion,percent weight loss (15.4% weight loss in 9 months) Percent weight loss: 15.4 % (less than 9 months)    Interventions: Glucerna shake,MVI,Magic cup,Calorie Count  Estimated body mass index is 21.08 kg/m as calculated from the following:   Height as of this encounter: 6' (1.829 m).   Weight as of this encounter: 70.5 kg.  DVT prophylaxis: Subcu heparin  code Status: DNR Family Communication: None at bedside Disposition Plan:  Status is: Inpatient  Dispo:  Patient From:  Skilled nursing facility  Planned Disposition:  Skilled nursing facility  Expected discharge date: 11/13/2020  Medically stable for discharge:  No     Consultants:   Palliative care, ID, IR, PCCM transfer, orthopedic surgery  Procedures: Left hip abscess drain to the left thigh 10/20/2020 Antimicrobials: Anti-infectives (From admission, onward)   Start     Dose/Rate Route Frequency Ordered Stop   11/08/20 2200  rifampin (RIFADIN) capsule 300 mg  Status:  Discontinued        300 mg Oral Every 12 hours 11/08/20 1319 11/08/20 1322   11/08/20 2200  rifampin (RIFADIN) 25 mg/mL oral suspension 300 mg        300 mg Oral Every 12 hours 11/08/20 1322     11/06/20 2200  rifampin (RIFADIN) 25 mg/mL oral suspension 300 mg  Status:  Discontinued        300 mg Per Tube Every 12 hours 11/06/20 1008 11/08/20 1319   10/26/20 1100   rifampin (RIFADIN) 300 mg in sodium chloride 0.9 % 100 mL IVPB  Status:  Discontinued        300 mg 200 mL/hr over 30 Minutes Intravenous Every 12 hours 10/26/20 1007 11/06/20 1008   10/24/20 0815  ceFAZolin (ANCEF) IVPB 2g/100 mL premix        2 g 200 mL/hr over 30 Minutes Intravenous Every 8 hours 10/24/20 0804     10/23/20 2200  ceFAZolin (ANCEF) IVPB 2g/100 mL premix  Status:  Discontinued        2 g 200 mL/hr over 30 Minutes Intravenous Every 12 hours 10/23/20 1406 10/24/20 0804   10/22/20 1000  ceFAZolin (ANCEF) IVPB 1 g/50 mL premix  Status:  Discontinued        1 g 100 mL/hr over 30 Minutes Intravenous Every 12 hours 11/02/2020 1745 10/23/20 1406   10/24/2020 2200  ceFAZolin (ANCEF) IVPB 2g/100 mL premix        2 g 200 mL/hr over 30 Minutes Intravenous  Once 10/26/2020 1739 11/09/2020 2239   10/20/2020 2100  cefTRIAXone (ROCEPHIN) 2 g in sodium chloride 0.9 % 100  mL IVPB  Status:  Discontinued        2 g 200 mL/hr over 30 Minutes Intravenous Every 24 hours 11/18/20 1117 2020/11/18 1129   2020-11-18 1730  linezolid (ZYVOX) IVPB 600 mg  Status:  Discontinued        600 mg 300 mL/hr over 60 Minutes Intravenous Every 12 hours 18-Nov-2020 1154 11-18-2020 1739   11-18-20 1130  cefTRIAXone (ROCEPHIN) 2 g in sodium chloride 0.9 % 100 mL IVPB  Status:  Discontinued        2 g 200 mL/hr over 30 Minutes Intravenous Every 24 hours 11/18/2020 1129 2020-11-18 1739   11/18/2020 1000  meropenem (MERREM) 500 mg in sodium chloride 0.9 % 100 mL IVPB  Status:  Discontinued        500 mg 200 mL/hr over 30 Minutes Intravenous Every 12 hours 11-18-20 0330 18-Nov-2020 1117   11/18/20 0400  linezolid (ZYVOX) IVPB 600 mg        600 mg 300 mL/hr over 60 Minutes Intravenous  Once 11/18/20 0309 11-18-2020 0654   2020/11/18 0215  ceFEPIme (MAXIPIME) 2 g in sodium chloride 0.9 % 100 mL IVPB        2 g 200 mL/hr over 30 Minutes Intravenous  Once 11-18-2020 0212 11-18-2020 0306   11-18-20 0215  ampicillin-sulbactam (UNASYN) 1.5 g in sodium  chloride 0.9 % 100 mL IVPB  Status:  Discontinued        1.5 g 200 mL/hr over 30 Minutes Intravenous  Once 11/18/2020 0212 November 18, 2020 0214   2020-11-18 0215  vancomycin (VANCOCIN) IVPB 1000 mg/200 mL premix  Status:  Discontinued        1,000 mg 200 mL/hr over 60 Minutes Intravenous  Once 18-Nov-2020 0212 11/18/2020 0314       Subjective: Patient resting in bed appears comfortable drain in place.  Does not answer questions appropriately or respond appropriately though.  Objective: Vitals:   11/09/20 1448 11/09/20 2100 11/10/20 0643 11/10/20 0759  BP: (!) 153/74 (!) 159/72 (!) 145/61 126/81  Pulse: 93 (!) 105 (!) 103 (!) 109  Resp:  Temp: 98.2 F (36.8 C) 98.3 F (36.8 C) 98.3 F (36.8 C) 98.7 F (37.1 C)  TempSrc:  Axillary Oral Axillary  SpO2:  98% 100% 100%  Weight:      Height:        Intake/Output Summary (Last 24 hours) at 11/10/2020 1310 Last data filed at 11/09/2020 2016 Gross per 24 hour  Intake 205 ml  Output 775 ml  Net -570 ml   Filed Weights   11/02/20 0825 11/05/20 0500 11/09/20 0451  Weight: 71.5 kg 72.8 kg 70.5 kg    Examination:  General exam: Appears calm and comfortable  Respiratory system: Clear to auscultation. Respiratory effort normal. Cardiovascular system: S1 & S2 heard, RRR. No JVD, murmurs, rubs, gallops or clicks. No pedal edema. Gastrointestinal system: Abdomen is nondistended, soft and nontender. No organomegaly or masses felt. Normal bowel sounds heard. Central nervous system: Alert and oriented. No focal neurological deficits. Extremities: Symmetric 5 x 5 power.  Left hip drain in place Skin: No rashes, lesions or ulcers Psychiatry: Judgement and insight appear normal. Mood & affect appropriate.     Data Reviewed: I have personally reviewed following labs and imaging studies  CBC: Recent Labs  Lab 11/06/20 0146 11/07/20 0132 11/08/20 0136 11/09/20 0236 11/10/20 0334  WBC 6.8 7.4 5.5 5.4 6.1  NEUTROABS 3.7 4.9 2.9 2.8 3.4   HGB 8.7* 9.5* 9.6*  9.0* 9.3*  HCT 25.6* 28.5* 27.4* 26.0* 26.4*  MCV 77.8* 79.6* 76.8* 78.8* 78.3*  PLT 455* 499* 405* 531* 505*   Basic Metabolic Panel: Recent Labs  Lab 11/04/20 0150 11/05/20 0023 11/06/20 0146 11/08/20 0136 11/09/20 0236 11/10/20 0334  NA 137 132* 136 136 140 144  K 3.8 4.3 3.9 4.1 4.0 3.9  CL 106 101 105 107 110 110  CO2 26 21* 26 21* 23 24  GLUCOSE 234* 250* 54* 196* 303* 354*  BUN 12 13 12 13 11 11   CREATININE 0.68 0.70 0.71 0.79 0.88 1.00  CALCIUM 7.5* 7.3* 7.6* 7.7* 7.9* 8.1*  MG 1.7  --   --  2.0 2.0 1.8  PHOS 2.1*  --   --  2.8 2.7 2.6   GFR: Estimated Creatinine Clearance: 60.7 mL/min (by C-G formula based on SCr of 1 mg/dL). Liver Function Tests: Recent Labs  Lab 11/04/20 0150 11/05/20 0023 11/06/20 0146 11/09/20 0236 11/10/20 0334  AST 22 24 20 18 18   ALT 8 10 9 7 9   ALKPHOS 190* 215* 242* 263* 305*  BILITOT 0.6 0.5 0.6 0.4 0.7  PROT 6.9 7.0 7.0 7.9 8.1  ALBUMIN 1.0* 1.1* 1.1* 1.2* 1.3*   No results for input(s): LIPASE, AMYLASE in the last 168 hours. No results for input(s): AMMONIA in the last 168 hours. Coagulation Profile: No results for input(s): INR, PROTIME in the last 168 hours. Cardiac Enzymes: No results for input(s): CKTOTAL, CKMB, CKMBINDEX, TROPONINI in the last 168 hours. BNP (last 3 results) No results for input(s): PROBNP in the last 8760 hours. HbA1C: No results for input(s): HGBA1C in the last 72 hours. CBG: Recent Labs  Lab 11/09/20 1551 11/09/20 2023 11/10/20 0603 11/10/20 0726 11/10/20 1219  GLUCAP 177* 306* 323* 333* 320*   Lipid Profile: No results for input(s): CHOL, HDL, LDLCALC, TRIG, CHOLHDL, LDLDIRECT in the last 72 hours. Thyroid Function Tests: No results for input(s): TSH, T4TOTAL, FREET4, T3FREE, THYROIDAB in the last 72 hours. Anemia Panel: Recent Labs    11/10/20 0334  VITAMINB12 332  FOLATE 7.9  FERRITIN 559*  TIBC 133*  IRON 25*  RETICCTPCT 2.2   Sepsis Labs: No results  for input(s): PROCALCITON, LATICACIDVEN in the last 168 hours.  Recent Results (from the past 240 hour(s))  Culture, Urine     Status: Abnormal   Collection Time: 11/02/20  5:08 AM   Specimen: Urine, Random  Result Value Ref Range Status   Specimen Description URINE, RANDOM  Final   Special Requests   Final    NONE Performed at [facility], [address], [place], [facility] [address]    Culture 40,000 COLONIES/mL YEAST (A)  Final   Report Status 11/03/2020 FINAL  Final  Resp Panel by RT-PCR (Flu A&B, Covid) Nasopharyngeal Swab     Status: None   Collection Time: 11/10/20  3:16 AM   Specimen: Nasopharyngeal Swab; Nasopharyngeal(NP) swabs in vial transport medium  Result Value Ref Range Status   SARS Coronavirus 2 by RT PCR NEGATIVE NEGATIVE Final    Comment: (NOTE) SARS-CoV-2 target nucleic acids are NOT DETECTED.  The SARS-CoV-2 RNA is generally detectable in upper respiratory specimens during the acute phase of infection. The lowest concentration of SARS-CoV-2 viral copies this assay can detect is 138 copies/mL. A negative result does not preclude SARS-Cov-2 infection and should not be used as the sole basis for treatment or other patient management decisions. A negative result may occur with  improper specimen collection/handling,  submission of specimen other than nasopharyngeal swab, presence of viral mutation(s) within the areas targeted by this assay, and inadequate number of viral copies(<138 copies/mL). A negative result must be combined with clinical observations, patient history, and epidemiological information. The expected result is Negative.  Fact Sheet for Patients:  BloggerCourse.com  Fact Sheet for Healthcare Providers:  SeriousBroker.it  This test is no t yet approved or cleared by the [place] FDA and  has been authorized for detection and/or diagnosis of SARS-CoV-2 by FDA under an Emergency Use  Authorization (EUA). This EUA will remain  in effect (meaning this test can be used) for the duration of the COVID-19 declaration under Section 564(b)(1) of the Act, 21 U.S.C.section 360bbb-3(b)(1), unless the authorization is terminated  or revoked sooner.       Influenza A by PCR NEGATIVE NEGATIVE Final   Influenza B by PCR NEGATIVE NEGATIVE Final    Comment: (NOTE) The Xpert Xpress SARS-CoV-2/FLU/RSV plus assay is intended as an aid in the diagnosis of influenza from Nasopharyngeal swab specimens and should not be used as a sole basis for treatment. Nasal washings and aspirates are unacceptable for Xpert Xpress SARS-CoV-2/FLU/RSV testing.  Fact Sheet for Patients: BloggerCourse.com  Fact Sheet for Healthcare Providers: SeriousBroker.it  This test is not yet approved or cleared by the [place] FDA and has been authorized for detection and/or diagnosis of SARS-CoV-2 by FDA under an Emergency Use Authorization (EUA). This EUA will remain in effect (meaning this test can be used) for the duration of the COVID-19 declaration under Section 564(b)(1) of the Act, 21 U.S.C. section 360bbb-3(b)(1), unless the authorization is terminated or revoked.  Performed at [facility], [address], [place], [place] [address]          Radiology Studies: No results found.      Scheduled Meds: . brimonidine  1 drop Both Eyes TID  . Chlorhexidine Gluconate Cloth  6 each Topical Daily  . diltiazem  60 mg Oral Q8H  . feeding supplement (GLUCERNA SHAKE)  237 mL Oral TID PC & HS  . heparin injection (subcutaneous)  5,000 Units Subcutaneous Q8H  . insulin aspart  0-5 Units Subcutaneous QHS  . insulin aspart  0-9 Units Subcutaneous TID WC  . latanoprost  1 drop Both Eyes QHS  . metoprolol tartrate  50 mg Oral BID  . multivitamin with minerals  1 tablet Oral Daily  . mupirocin ointment   Topical BID  . pantoprazole sodium   40 mg Oral Daily  . rifampin  300 mg Oral Q12H  . sodium chloride flush  5 mL Intracatheter Q8H   Continuous Infusions: . sodium chloride 250 mL (11/01/20 2145)  .  ceFAZolin (ANCEF) IV 2 g (11/10/20 0423)     LOS: 20 days     Alwyn Ren, MD 11/10/2020, 1:10 PM

## 2020-11-10 NOTE — Progress Notes (Signed)
Inpatient Diabetes Program Recommendations  AACE/ADA: New Consensus Statement on Inpatient Glycemic Control (2015)  Target Ranges:  Prepandial:   less than 140 mg/dL      Peak postprandial:   less than 180 mg/dL (1-2 hours)      Critically ill patients:  140 - 180 mg/dL   Lab Results  Component Value Date   GLUCAP 333 (H) 11/10/2020   HGBA1C 8.1 (H) 10/20/2020    Review of Glycemic Control  Fasting CBG 333  Inpatient Diabetes Program Recommendations:   -Add Lantus 10 units qd Secure chat sent to Dr. Marland Mcalpine.  Thank you, Billy Fischer. Claudell Rhody, RN, MSN, CDE  Diabetes Coordinator Inpatient Glycemic Control Team Team Pager [phone] (8am-5pm) 11/10/2020 8:01 AM

## 2020-11-11 LAB — CBC WITH DIFFERENTIAL/PLATELET
Abs Immature Granulocytes: 0.05 10*3/uL (ref 0.00–0.07)
Basophils Absolute: 0.1 10*3/uL (ref 0.0–0.1)
Basophils Relative: 1 %
Eosinophils Absolute: 0.2 10*3/uL (ref 0.0–0.5)
Eosinophils Relative: 2 %
HCT: 27.8 % — ABNORMAL LOW (ref 39.0–52.0)
Hemoglobin: 9.3 g/dL — ABNORMAL LOW (ref 13.0–17.0)
Immature Granulocytes: 1 %
Lymphocytes Relative: 20 %
Lymphs Abs: 2 10*3/uL (ref 0.7–4.0)
MCH: 26.8 pg (ref 26.0–34.0)
MCHC: 33.5 g/dL (ref 30.0–36.0)
MCV: 80.1 fL (ref 80.0–100.0)
Monocytes Absolute: 1 10*3/uL (ref 0.1–1.0)
Monocytes Relative: 10 %
Neutro Abs: 6.4 10*3/uL (ref 1.7–7.7)
Neutrophils Relative %: 66 %
Platelets: 400 10*3/uL (ref 150–400)
RBC: 3.47 MIL/uL — ABNORMAL LOW (ref 4.22–5.81)
RDW: 21.7 % — ABNORMAL HIGH (ref 11.5–15.5)
WBC: 9.7 10*3/uL (ref 4.0–10.5)
nRBC: 0 % (ref 0.0–0.2)

## 2020-11-11 LAB — GLUCOSE, CAPILLARY
Glucose-Capillary: 250 mg/dL — ABNORMAL HIGH (ref 70–99)
Glucose-Capillary: 198 mg/dL — ABNORMAL HIGH (ref 70–99)
Glucose-Capillary: 178 mg/dL — ABNORMAL HIGH (ref 70–99)
Glucose-Capillary: 182 mg/dL — ABNORMAL HIGH (ref 70–99)
Glucose-Capillary: 165 mg/dL — ABNORMAL HIGH (ref 70–99)

## 2020-11-11 NOTE — Plan of Care (Signed)
  Problem: Clinical Measurements: Goal: Ability to maintain clinical measurements within normal limits will improve Outcome: Progressing Goal: Will remain free from infection Outcome: Progressing Goal: Diagnostic test results will improve Outcome: Progressing Goal: Respiratory complications will improve Outcome: Progressing Goal: Cardiovascular complication will be avoided Outcome: Progressing   Problem: Activity: Goal: Risk for activity intolerance will decrease Outcome: Not Progressing   Problem: Nutrition: Goal: Adequate nutrition will be maintained Outcome: Progressing   Problem: Coping: Goal: Level of anxiety will decrease Outcome: Progressing   Problem: Elimination: Goal: Will not experience complications related to bowel motility Outcome: Progressing   Problem: Pain Managment: Goal: General experience of comfort will improve Outcome: Progressing   Problem: Skin Integrity: Goal: Risk for impaired skin integrity will decrease Outcome: Progressing   Problem: Nutrition Goal: Patient maintains adequate hydration Outcome: Progressing

## 2020-11-11 NOTE — Progress Notes (Signed)
MEWS fired yellow for sinus tachycardia patient asymptomatic Charge RN Mindy informed and [facility] notified new orders received and patient scheduled medications given. The out come patient stable Sinus HR 82 patient asleep. Ilean Skill LPN

## 2020-11-11 NOTE — Progress Notes (Signed)
PROGRESS NOTE    Marcus LankSamuel J Morell Jr.  ZOX:[id number] DOB: 02/10/1941 DOA: 11/04/2020 PCP: Eustaquio BoydenGutierrez, Javier, MD  Brief Narrative:79 year old African-American male with a past medical history significant for but not limited to vascular dementia, previous CVA with residual left-sided hemiplegia, essential hypertension, type 2 diabetes mellitus, dysphagia and hyperlipidemia as well as chronic urine retention with indwelling Foley catheter secondary to BPH and recent left femoral neck fracture status post left hemiarthroplasty on 08/08/2020 who presented from his nursing facility with altered mental status, agitation and acute kidney injury with electrolyte abnormalities with hyponatremia.  He was in septic shock and found to have an abscess around his arthroplasty hardware and an MSSA bacteremia and was deemed not a surgical candidate.  He was empirically treated with IV antibiotics for his MSSA infection and ended up having a left hip drain. [facility]. Overall his prognosis and clinical status remained poor and my colleagues have had multiple conversations with family about palliative care or hospice and family not ready yet.  On 11/08/2020 is pain to tube was removed and he was eating about 50% of his meals but this is not been adequately documented.  We will ask for a calorie count to see if the patient actually nutrition orally.  If he does not he may be hospice appropriate.  Palliative care following appreciate continue goals of care discussion.  Assessment & Plan:   Principal Problem:   MSSA bacteremia Active Problems:   Protein-calorie malnutrition, severe (HCC)   Encounter for nasogastric (NG) tube placement   Severe sepsis (HCC)   Abscess of left hip   Hypernatremia   Abscess   Acute kidney injury (nontraumatic) (HCC)   Dehydration   Elevated liver function tests   Prosthetic hip infection (HCC)   Hyperkalemia   #1 sepsis and septic shock secondary to  MSSA bacteremia and left hip abscess at prosthetic hip infection and catheter associated UTI.  He was treated with vasopressors.  Blood cultures hip abscess and urine culture all grew MSSA.  Repeat blood cultures have been negative.  Echo without any vegetation.  IR guided abscess drain in place.  On cefazolin and rifampin for a total of 6 weeks antibiotic end date is December 03, 2020.  Patient seen by IR recommending CT of the hip once his drain is less than 10 cc/day.  #2 severe malnutrition related to chronic illness/dementia as evidenced by severe fat and muscle depletion poor poor p.o. intake calorie count was ordered and appreciate dietary and I have reviewed their notes.  They recommend continuing calorie count over the weekend and following up on Monday.  Patient needs to eat at least 90% of his meals prior to discharge to skilled nursing facility.   He does not want PEG tube or Dobbhoff tube. Remeron started by palliative 11/10/2020 to increase appetite.  #3 sinus tachycardia on Cardizem and metoprolol improving normal TSH  #4 acute metabolic encephalopathy in the setting of CVA and vascular dementia followed by palliative care.  Patient has not made any significant improvement.  MRI of the brain showed no acute process.  Prior attending had discussed hospice with the family they are not ready to make the patient hospice yet.  #5 GERD continue with Protonix  #6 AKI improving with IV fluids  #7 hyponatremia  #8 type 2 diabetes with uncontrolled hyperglycemia patient was placed on a sensitive sliding scale due to hypoglycemia on 11/06/2020.  Seen by diabetic coordinator this morning recommending Lantus  10 units daily we will start. CBG (last 3)  Recent Labs    11/10/20 1706 11/10/20 2034 11/11/20 0742  GLUCAP 225* 134* 250*     #9 pressure injury as below Pressure Injury 08/07/20 Buttocks Right;Medial;Left Stage 2 -  Partial thickness loss of dermis presenting as a shallow open  injury with a red, pink wound bed without slough. (Active)  08/07/20 1914  Location: Buttocks  Location Orientation: Right;Medial;Left  Staging: Stage 2 -  Partial thickness loss of dermis presenting as a shallow open injury with a red, pink wound bed without slough.  Wound Description (Comments):   Present on Admission: Yes     Pressure Injury 11/02/20 Penis (Active)  11/02/20 0344  Location: Penis  Location Orientation:   Staging:   Wound Description (Comments):   Present on Admission:       Nutrition Problem: Severe Malnutrition Etiology: chronic illness (dementia)     Signs/Symptoms: severe fat depletion,severe muscle depletion,percent weight loss (15.4% weight loss in 9 months) Percent weight loss: 15.4 % (less than 9 months)    Interventions: Glucerna shake,MVI,Magic cup,Calorie Count  Estimated body mass index is 21.08 kg/m as calculated from the following:   Height as of this encounter: 6' (1.829 m).   Weight as of this encounter: 70.5 kg.  DVT prophylaxis: Subcu heparin  code Status: DNR Family Communication: None at bedside Disposition Plan:  Status is: Inpatient  Dispo:  Patient From:  Skilled nursing facility  Planned Disposition:  Skilled nursing facility  Expected discharge date: 11/13/2020  Medically stable for discharge:  No     Consultants:   Palliative care, ID, IR, PCCM transfer, orthopedic surgery  Procedures: Left hip abscess drain to the left thigh 11/11/2020 Antimicrobials: Anti-infectives (From admission, onward)   Start     Dose/Rate Route Frequency Ordered Stop   11/08/20 2200  rifampin (RIFADIN) capsule 300 mg  Status:  Discontinued        300 mg Oral Every 12 hours 11/08/20 1319 11/08/20 1322   11/08/20 2200  rifampin (RIFADIN) 25 mg/mL oral suspension 300 mg        300 mg Oral Every 12 hours 11/08/20 1322     11/06/20 2200  rifampin (RIFADIN) 25 mg/mL oral suspension 300 mg  Status:  Discontinued        300 mg Per Tube Every  12 hours 11/06/20 1008 11/08/20 1319   10/26/20 1100  rifampin (RIFADIN) 300 mg in sodium chloride 0.9 % 100 mL IVPB  Status:  Discontinued        300 mg 200 mL/hr over 30 Minutes Intravenous Every 12 hours 10/26/20 1007 11/06/20 1008   10/24/20 0815  ceFAZolin (ANCEF) IVPB 2g/100 mL premix        2 g 200 mL/hr over 30 Minutes Intravenous Every 8 hours 10/24/20 0804     10/23/20 2200  ceFAZolin (ANCEF) IVPB 2g/100 mL premix  Status:  Discontinued        2 g 200 mL/hr over 30 Minutes Intravenous Every 12 hours 10/23/20 1406 10/24/20 0804   10/22/20 1000  ceFAZolin (ANCEF) IVPB 1 g/50 mL premix  Status:  Discontinued        1 g 100 mL/hr over 30 Minutes Intravenous Every 12 hours 11/08/2020 1745 10/23/20 1406   11/04/2020 2200  ceFAZolin (ANCEF) IVPB 2g/100 mL premix        2 g 200 mL/hr over 30 Minutes Intravenous  Once 11/09/2020 1739 10/25/2020 2239   10/29/2020 2100  cefTRIAXone (ROCEPHIN)  2 g in sodium chloride 0.9 % 100 mL IVPB  Status:  Discontinued        2 g 200 mL/hr over 30 Minutes Intravenous Every 24 hours 11/10/2020 1117 11-10-2020 1129   10-Nov-2020 1730  linezolid (ZYVOX) IVPB 600 mg  Status:  Discontinued        600 mg 300 mL/hr over 60 Minutes Intravenous Every 12 hours 11/10/2020 1154 11/10/20 1739   November 10, 2020 1130  cefTRIAXone (ROCEPHIN) 2 g in sodium chloride 0.9 % 100 mL IVPB  Status:  Discontinued        2 g 200 mL/hr over 30 Minutes Intravenous Every 24 hours November 10, 2020 1129 November 10, 2020 1739   11-10-2020 1000  meropenem (MERREM) 500 mg in sodium chloride 0.9 % 100 mL IVPB  Status:  Discontinued        500 mg 200 mL/hr over 30 Minutes Intravenous Every 12 hours November 10, 2020 0330 11-10-20 1117   11-10-20 0400  linezolid (ZYVOX) IVPB 600 mg        600 mg 300 mL/hr over 60 Minutes Intravenous  Once 11/10/20 0309 11/10/20 0654   11/10/2020 0215  ceFEPIme (MAXIPIME) 2 g in sodium chloride 0.9 % 100 mL IVPB        2 g 200 mL/hr over 30 Minutes Intravenous  Once Nov 10, 2020 0212 11/10/20 0306   11/10/2020  0215  ampicillin-sulbactam (UNASYN) 1.5 g in sodium chloride 0.9 % 100 mL IVPB  Status:  Discontinued        1.5 g 200 mL/hr over 30 Minutes Intravenous  Once 11/10/2020 0212 November 10, 2020 0214   11/10/20 0215  vancomycin (VANCOCIN) IVPB 1000 mg/200 mL premix  Status:  Discontinued        1,000 mg 200 mL/hr over 60 Minutes Intravenous  Once 11-10-20 0212 10-Nov-2020 0314       Subjective: He is resting in bed he is asleep in no acute distress Less interactive today compared to yesterday Not eating but consuming Ensure and Magic cup  Objective: Vitals:   11/10/20 2225 11/11/20 0005 11/11/20 0550 11/11/20 0913  BP: 102/61 (!) 100/44 (!) 123/57 (!) 121/51  Pulse: (!) 117 82 (!) 105 (!) 106  Resp: (!) 21 20 16 18   Temp: 97.9 F (36.6 C)  98 F (36.7 C) 99.8 F (37.7 C)  TempSrc:   Oral Axillary  SpO2: 100% 100%  100%  Weight:      Height:        Intake/Output Summary (Last 24 hours) at 11/11/2020 0927 Last data filed at 11/11/2020 11/13/2020 Gross per 24 hour  Intake 552 ml  Output 405 ml  Net 147 ml   Filed Weights   11/02/20 0825 11/05/20 0500 11/09/20 0451  Weight: 71.5 kg 72.8 kg 70.5 kg    Examination:  General exam: Appears calm and comfortable  Respiratory system: Clear to auscultation. Respiratory effort normal. Cardiovascular system: S1 & S2 heard, RRR. No JVD, murmurs, rubs, gallops or clicks. No pedal edema. Gastrointestinal system: Abdomen is nondistended, soft and nontender. No organomegaly or masses felt. Normal bowel sounds heard. Central nervous system: No gross focal abnormalities Extremities: Symmetric 5 x 5 power.  Left hip drain in place Skin: No rashes, lesions or ulcers Psychiatry: Unable to assess   Data Reviewed: I have personally reviewed following labs and imaging studies  CBC: Recent Labs  Lab 11/07/20 0132 11/08/20 0136 11/09/20 0236 11/10/20 0334 11/11/20 0252  WBC 7.4 5.5 5.4 6.1 9.7  NEUTROABS 4.9 2.9 2.8 3.4 6.4  HGB 9.5* 9.6*  9.0* 9.3*  9.3*  HCT 28.5* 27.4* 26.0* 26.4* 27.8*  MCV 79.6* 76.8* 78.8* 78.3* 80.1  PLT 499* 405* 531* 505* 400   Basic Metabolic Panel: Recent Labs  Lab 11/05/20 0023 11/06/20 0146 11/08/20 0136 11/09/20 0236 11/10/20 0334  NA 132* 136 136 140 144  K 4.3 3.9 4.1 4.0 3.9  CL 101 105 107 110 110  CO2 21* 26 21* 23 24  GLUCOSE 250* 54* 196* 303* 354*  BUN CREATININE 0.70 0.71 0.79 0.88 1.00  CALCIUM 7.3* 7.6* 7.7* 7.9* 8.1*  MG  --   --  2.0 2.0 1.8  PHOS  --   --  2.8 2.7 2.6   GFR: Estimated Creatinine Clearance: 60.7 mL/min (by C-G formula based on SCr of 1 mg/dL). Liver Function Tests: Recent Labs  Lab 11/05/20 0023 11/06/20 0146 11/09/20 0236 11/10/20 0334  AST ALT ALKPHOS 215* 242* 263* 305*  BILITOT 0.5 0.6 0.4 0.7  PROT 7.0 7.0 7.9 8.1  ALBUMIN 1.1* 1.1* 1.2* 1.3*   No results for input(s): LIPASE, AMYLASE in the last 168 hours. No results for input(s): AMMONIA in the last 168 hours. Coagulation Profile: No results for input(s): INR, PROTIME in the last 168 hours. Cardiac Enzymes: No results for input(s): CKTOTAL, CKMB, CKMBINDEX, TROPONINI in the last 168 hours. BNP (last 3 results) No results for input(s): PROBNP in the last 8760 hours. HbA1C: No results for input(s): HGBA1C in the last 72 hours. CBG: Recent Labs  Lab 11/10/20 0726 11/10/20 1219 11/10/20 1706 11/10/20 2034 11/11/20 0742  GLUCAP 333* 320* 225* 134* 250*   Lipid Profile: No results for input(s): CHOL, HDL, LDLCALC, TRIG, CHOLHDL, LDLDIRECT in the last 72 hours. Thyroid Function Tests: No results for input(s): TSH, T4TOTAL, FREET4, T3FREE, THYROIDAB in the last 72 hours. Anemia Panel: Recent Labs    11/10/20 0334  VITAMINB12 332  FOLATE 7.9  FERRITIN 559*  TIBC 133*  IRON 25*  RETICCTPCT 2.2   Sepsis Labs: No results for input(s): PROCALCITON, LATICACIDVEN in the last 168 hours.  Recent Results (from the past 240 hour(s))  Culture, Urine      Status: Abnormal   Collection Time: 11/02/20  5:08 AM   Specimen: Urine, Random  Result Value Ref Range Status   Specimen Description URINE, RANDOM  Final   Special Requests   Final    NONE Performed at [facility], [address] [place], [place] [address]    Culture 40,000 COLONIES/mL YEAST (A)  Final   Report Status 11/03/2020 FINAL  Final  Resp Panel by RT-PCR (Flu A&B, Covid) Nasopharyngeal Swab     Status: None   Collection Time: 11/10/20  3:16 AM   Specimen: Nasopharyngeal Swab; Nasopharyngeal(NP) swabs in vial transport medium  Result Value Ref Range Status   SARS Coronavirus 2 by RT PCR NEGATIVE NEGATIVE Final    Comment: (NOTE) SARS-CoV-2 target nucleic acids are NOT DETECTED.  The SARS-CoV-2 RNA is generally detectable in upper respiratory specimens during the acute phase of infection. The lowest concentration of SARS-CoV-2 viral copies this assay can detect is 138 copies/mL. A negative result does not preclude SARS-Cov-2 infection and should not be used as the sole basis for treatment or other patient management decisions. A negative result may occur with  improper specimen collection/handling, submission of specimen other than nasopharyngeal swab, presence of viral mutation(s) within the areas targeted by this assay,  and inadequate number of viral copies(<138 copies/mL). A negative result must be combined with clinical observations, patient history, and epidemiological information. The expected result is Negative.  Fact Sheet for Patients:  BloggerCourse.com  Fact Sheet for Healthcare Providers:  SeriousBroker.it  This test is no t yet approved or cleared by the [place] FDA and  has been authorized for detection and/or diagnosis of SARS-CoV-2 by FDA under an Emergency Use Authorization (EUA). This EUA will remain  in effect (meaning this test can be used) for the duration of the COVID-19  declaration under Section 564(b)(1) of the Act, 21 U.S.C.section 360bbb-3(b)(1), unless the authorization is terminated  or revoked sooner.       Influenza A by PCR NEGATIVE NEGATIVE Final   Influenza B by PCR NEGATIVE NEGATIVE Final    Comment: (NOTE) The Xpert Xpress SARS-CoV-2/FLU/RSV plus assay is intended as an aid in the diagnosis of influenza from Nasopharyngeal swab specimens and should not be used as a sole basis for treatment. Nasal washings and aspirates are unacceptable for Xpert Xpress SARS-CoV-2/FLU/RSV testing.  Fact Sheet for Patients: BloggerCourse.com  Fact Sheet for Healthcare Providers: SeriousBroker.it  This test is not yet approved or cleared by the [place] FDA and has been authorized for detection and/or diagnosis of SARS-CoV-2 by FDA under an Emergency Use Authorization (EUA). This EUA will remain in effect (meaning this test can be used) for the duration of the COVID-19 declaration under Section 564(b)(1) of the Act, 21 U.S.C. section 360bbb-3(b)(1), unless the authorization is terminated or revoked.  Performed at [facility], [address], [place], [place] [address]          Radiology Studies: No results found.      Scheduled Meds: . brimonidine  1 drop Both Eyes TID  . Chlorhexidine Gluconate Cloth  6 each Topical Daily  . diltiazem  60 mg Oral Q8H  . feeding supplement (GLUCERNA SHAKE)  237 mL Oral TID PC & HS  . heparin injection (subcutaneous)  5,000 Units Subcutaneous Q8H  . insulin aspart  0-5 Units Subcutaneous QHS  . insulin aspart  0-9 Units Subcutaneous TID WC  . insulin glargine  10 Units Subcutaneous QHS  . latanoprost  1 drop Both Eyes QHS  . metoprolol tartrate  50 mg Oral BID  . metoprolol tartrate  5 mg Intravenous Once  . mirtazapine  7.5 mg Oral QHS  . multivitamin with minerals  1 tablet Oral Daily  . mupirocin ointment   Topical BID  . pantoprazole  sodium  40 mg Oral Daily  . rifampin  300 mg Oral Q12H  . sodium chloride flush  5 mL Intracatheter Q8H   Continuous Infusions: . sodium chloride 250 mL (11/01/20 2145)  .  ceFAZolin (ANCEF) IV 2 g (11/11/20 0092)     LOS: 21 days     Alwyn Ren, MD 11/11/2020, 9:27 AM

## 2020-11-12 LAB — GLUCOSE, CAPILLARY
Glucose-Capillary: 151 mg/dL — ABNORMAL HIGH (ref 70–99)
Glucose-Capillary: 170 mg/dL — ABNORMAL HIGH (ref 70–99)
Glucose-Capillary: 170 mg/dL — ABNORMAL HIGH (ref 70–99)
Glucose-Capillary: 149 mg/dL — ABNORMAL HIGH (ref 70–99)
Glucose-Capillary: 160 mg/dL — ABNORMAL HIGH (ref 70–99)

## 2020-11-12 MED ORDER — SODIUM CHLORIDE 0.9 % IV SOLN: INTRAVENOUS | Status: DC

## 2020-11-12 NOTE — Progress Notes (Signed)
Daily Progress Note   Patient Name: Marcus Downs.       Date: 11/12/2020 DOB: Nov 13, 1941  Age: 79 y.o. MRN#: [id number] Attending Physician: Marcus Ren, MD Primary Care Physician: Marcus Boyden, MD Admit Date: 11/15/2020  Reason for Consultation/Follow-up: to discuss complex medical decision making related to patient's goals of care  Spoke with Dr. Jerolyn Downs regarding Marcus Downs.  He is not eating enough to sustain himself and does not want the cor trak replaced or a PEG placed.  On exam the patient appears comfortable however he is tachycardic and his lungs sound congested.  Subjective: Spoke with patient.  He is able to answer some questions clearly but others I can not understand his response.  He tells me his neck hurts and that he is ready to go, but is unable to tell me where he wants to go.  He keeps his eyes closed while he speaks with me.     Assessment: With regard to his mental status and his ability to take in nutrition he has not improved since I last saw him on 12/20.  He is severely malnourished.  His mental status waxes and wanes.  He is very fragile.   Patient Profile/HPI:  79 y.o.malewith past medical history of stroke, vascular dementia, hypertension, hyperlipidemia, diabetes, GERD, dysphagia, chronic urinary retention with indwelling catheter due to BPH, L hemiarthroplasty 09/2021[facility].CT A/P found left hip abscess with MSSA bacteremia. Has Cortrak in place with ongoing decreased mental status. Overall prognosis poor. Palliative care requested to continue goals of care conversations with family.  Length of Stay: 22   Vital Signs: BP 108/64 (BP Location: Left Arm)   Pulse 78   Temp 98.9 F (37.2 C)   Resp  14   Ht 6' (1.829 m)   Wt 70.5 kg   SpO2 99%   BMI 21.08 kg/m  SpO2: SpO2: 99 % O2 Device: O2 Device: Room Air O2 Flow Rate:         Palliative Assessment/Data: 20%     Palliative Care Plan    Recommendations/Plan:  PMT meeting with son Marcus Downs and 3 other family members tomorrow 12/27 at 2:00 pm.  Code Status:  DNR  Prognosis:  Weeks.  If we focus on his comfort he is [facility] eligible.  Discharge Planning:  To Be Determined  Care plan was discussed with Dr. Jerolyn Downs and son Marcus Downs.  Thank you for allowing the Palliative Medicine Team to assist in the care of this patient.  Total time spent:  35 min.     Greater than 50%  of this time was spent counseling and coordinating care related to the above assessment and plan.  Marcus Richards, PA-C Palliative Medicine  Please contact Palliative MedicineTeam phone at [phone] for questions and concerns between 7 am - 7 pm.   Please see AMION for individual provider pager numbers.

## 2020-11-12 NOTE — Plan of Care (Signed)
  Problem: Clinical Measurements: Goal: Diagnostic test results will improve Outcome: Progressing Goal: Respiratory complications will improve Outcome: Progressing Goal: Cardiovascular complication will be avoided Outcome: Progressing   Problem: Coping: Goal: Level of anxiety will decrease Outcome: Progressing   Problem: Elimination: Goal: Will not experience complications related to bowel motility Outcome: Progressing Goal: Will not experience complications related to urinary retention Outcome: Progressing   Problem: Pain Managment: Goal: General experience of comfort will improve Outcome: Progressing   Problem: Safety: Goal: Ability to remain free from injury will improve Outcome: Progressing

## 2020-11-12 NOTE — Progress Notes (Signed)
PROGRESS NOTE    Denton Lank.  NKN:[id number] DOB: 06-Dec-1940 DOA: 11/02/2020 PCP: [name], MD  Brief Narrative:79 year old African-American male with a past medical history significant for but not limited to vascular dementia, previous CVA with residual left-sided hemiplegia, essential hypertension, type 2 diabetes mellitus, dysphagia and hyperlipidemia as well as chronic urine retention with indwelling Foley catheter secondary to BPH and recent left femoral neck fracture status post left hemiarthroplasty on 08/08/2020 who presented from his nursing facility with altered mental status, agitation and acute kidney injury with electrolyte abnormalities with hyponatremia.  He was in septic shock and found to have an abscess around his arthroplasty hardware and an MSSA bacteremia and was deemed not a surgical candidate.  He was empirically treated with IV antibiotics for his MSSA infection and ended up having a left hip drain. He was transferred back to [facility] service 10/24/20. Overall his prognosis and clinical status remained poor and my colleagues have had multiple conversations with family about palliative care or hospice and family not ready yet.  On 11/08/2020 is pain to tube was removed and he was eating about 50% of his meals but this is not been adequately documented.  We will ask for a calorie count to see if the patient actually nutrition orally.  If he does not he may be hospice appropriate.  Palliative care following appreciate continue goals of care discussion.  Assessment & Plan:   Principal Problem:   MSSA bacteremia Active Problems:   Protein-calorie malnutrition, severe (HCC)   Encounter for nasogastric (NG) tube placement   Severe sepsis (HCC)   Abscess of left hip   Hypernatremia   Abscess   Acute kidney injury (nontraumatic) (HCC)   Dehydration   Elevated liver function tests   Prosthetic hip infection (HCC)   Hyperkalemia   #1 sepsis and septic shock secondary to  MSSA bacteremia and left hip abscess at prosthetic hip infection and catheter associated UTI.  He was treated with vasopressors.  Blood cultures hip abscess and urine culture all grew MSSA.  Repeat blood cultures have been negative.  Echo without any vegetation.  IR guided abscess drain in place.  On cefazolin and rifampin for a total of 6 weeks antibiotic end date is December 03, 2020.  Patient seen by IR recommending CT of the hip once his drain is less than 10 cc/day.  #2 severe malnutrition related to chronic illness/dementia as evidenced by severe fat and muscle depletion poor poor p.o. intake calorie count was ordered and appreciate dietary and I have reviewed their notes.  They recommend continuing calorie count over the weekend and following up on Monday.  Patient needs to eat at least 90% of his meals prior to discharge to skilled nursing facility.   He does not want PEG tube or Dobbhoff tube. Remeron started by palliative 11/10/2020 to increase appetite. Patient has had no significant p.o. intake the whole of yesterday.  He was not alert enough even to take medications yesterday.  I attempted to call his son [name] went into voicemail I left a voicemail for him to call me back. We will start him on normal saline 75 cc an hour since his p.o. intake is poor will be to discuss with family about goals of care.  #3 sinus tachycardia on Cardizem and metoprolol improving normal TSH  #4 acute metabolic encephalopathy in the setting of CVA and vascular dementia followed by palliative care.  Patient has not made any significant improvement.  MRI of the  brain showed no acute process.  Prior attending had discussed hospice with the family they are not ready to make the patient hospice yet.  #5 GERD continue with Protonix  #6 AKI improving with IV fluids  #7 hyponatremia  #8 type 2 diabetes with uncontrolled hyperglycemia patient was placed on a sensitive sliding scale due to hypoglycemia on 11/06/2020.   On Lantus 10 units 11/10/2020 since his blood sugars were trending up.  Since starting Lantus blood sugars have been better continue the same for now.  CBG (last 3)  Recent Labs    11/11/20 1758 11/11/20 2119 11/12/20 0746  GLUCAP 182* 165* 151*     #9 pressure injury as below Pressure Injury 08/07/20 Buttocks Right;Medial;Left Stage 2 -  Partial thickness loss of dermis presenting as a shallow open injury with a red, pink wound bed without slough. (Active)  08/07/20 1914  Location: Buttocks  Location Orientation: Right;Medial;Left  Staging: Stage 2 -  Partial thickness loss of dermis presenting as a shallow open injury with a red, pink wound bed without slough.  Wound Description (Comments):   Present on Admission: Yes     Pressure Injury 11/02/20 Penis (Active)  11/02/20 0344  Location: Penis  Location Orientation:   Staging:   Wound Description (Comments):   Present on Admission:       Nutrition Problem: Severe Malnutrition Etiology: chronic illness (dementia)     Signs/Symptoms: severe fat depletion,severe muscle depletion,percent weight loss (15.4% weight loss in 9 months) Percent weight loss: 15.4 % (less than 9 months)    Interventions: Glucerna shake,MVI,Magic cup,Calorie Count  Estimated body mass index is 21.08 kg/m as calculated from the following:   Height as of this encounter: 6' (1.829 m).   Weight as of this encounter: 70.5 kg.  DVT prophylaxis: Subcu heparin  code Status: DNR Family Communication: None at bedside Disposition Plan:  Status is: Inpatient  Dispo:  Patient From:  Skilled nursing facility  Planned Disposition:  Skilled nursing facility  Expected discharge date: 11/13/2020  Medically stable for discharge:  No     Consultants:   Palliative care, ID, IR, PCCM transfer, orthopedic surgery  Procedures: Left hip abscess drain to the left thigh 10/20/2020 Antimicrobials: Anti-infectives (From admission, onward)   Start      Dose/Rate Route Frequency Ordered Stop   11/08/20 2200  rifampin (RIFADIN) capsule 300 mg  Status:  Discontinued        300 mg Oral Every 12 hours 11/08/20 1319 11/08/20 1322   11/08/20 2200  rifampin (RIFADIN) 25 mg/mL oral suspension 300 mg        300 mg Oral Every 12 hours 11/08/20 1322     11/06/20 2200  rifampin (RIFADIN) 25 mg/mL oral suspension 300 mg  Status:  Discontinued        300 mg Per Tube Every 12 hours 11/06/20 1008 11/08/20 1319   10/26/20 1100  rifampin (RIFADIN) 300 mg in sodium chloride 0.9 % 100 mL IVPB  Status:  Discontinued        300 mg 200 mL/hr over 30 Minutes Intravenous Every 12 hours 10/26/20 1007 11/06/20 1008   10/24/20 0815  ceFAZolin (ANCEF) IVPB 2g/100 mL premix        2 g 200 mL/hr over 30 Minutes Intravenous Every 8 hours 10/24/20 0804     10/23/20 2200  ceFAZolin (ANCEF) IVPB 2g/100 mL premix  Status:  Discontinued        2 g 200 mL/hr over 30 Minutes Intravenous Every  12 hours 10/23/20 1406 10/24/20 0804   10/22/20 1000  ceFAZolin (ANCEF) IVPB 1 g/50 mL premix  Status:  Discontinued        1 g 100 mL/hr over 30 Minutes Intravenous Every 12 hours 11/02/2020 1745 10/23/20 1406   10/19/2020 2200  ceFAZolin (ANCEF) IVPB 2g/100 mL premix        2 g 200 mL/hr over 30 Minutes Intravenous  Once 11/17/2020 1739 11/07/2020 2239   10/20/2020 2100  cefTRIAXone (ROCEPHIN) 2 g in sodium chloride 0.9 % 100 mL IVPB  Status:  Discontinued        2 g 200 mL/hr over 30 Minutes Intravenous Every 24 hours 11/07/2020 1117 11/15/2020 1129   10/20/2020 1730  linezolid (ZYVOX) IVPB 600 mg  Status:  Discontinued        600 mg 300 mL/hr over 60 Minutes Intravenous Every 12 hours 11/09/2020 1154 10/20/2020 1739   10/22/2020 1130  cefTRIAXone (ROCEPHIN) 2 g in sodium chloride 0.9 % 100 mL IVPB  Status:  Discontinued        2 g 200 mL/hr over 30 Minutes Intravenous Every 24 hours 10/23/2020 1129 11/05/2020 1739   10/29/2020 1000  meropenem (MERREM) 500 mg in sodium chloride 0.9 % 100 mL IVPB  Status:   Discontinued        500 mg 200 mL/hr over 30 Minutes Intravenous Every 12 hours 11/13/2020 0330 11/17/2020 1117   11/15/2020 0400  linezolid (ZYVOX) IVPB 600 mg        600 mg 300 mL/hr over 60 Minutes Intravenous  Once 11/01/2020 0309 10/23/2020 0654   11/09/2020 0215  ceFEPIme (MAXIPIME) 2 g in sodium chloride 0.9 % 100 mL IVPB        2 g 200 mL/hr over 30 Minutes Intravenous  Once 10/25/2020 0212 11/06/2020 0306   11/05/2020 0215  ampicillin-sulbactam (UNASYN) 1.5 g in sodium chloride 0.9 % 100 mL IVPB  Status:  Discontinued        1.5 g 200 mL/hr over 30 Minutes Intravenous  Once 10/23/2020 0212 10/25/2020 0214   10/23/2020 0215  vancomycin (VANCOCIN) IVPB 1000 mg/200 mL premix  Status:  Discontinued        1,000 mg 200 mL/hr over 60 Minutes Intravenous  Once 11/07/2020 0212 11/17/2020 0314       Subjective: He is resting in bed his eyes are open he is trying to speak he is more awake than yesterday but speech is garbled and not clear  Objective: Vitals:   11/11/20 1526 11/11/20 2125 11/12/20 0443 11/12/20 0905  BP:  (!) 112/51 (!) 91/55 108/64  Pulse: 65 100 100 78  Resp:  19 20 14   Temp:  99.2 F (37.3 C) 98.9 F (37.2 C) 98.9 F (37.2 C)  TempSrc:  Oral    SpO2:  100% 100% 99%  Weight:      Height:        Intake/Output Summary (Last 24 hours) at 11/12/2020 1013 Last data filed at 11/12/2020 0700 Gross per 24 hour  Intake 210 ml  Output 325 ml  Net -115 ml   Filed Weights   11/02/20 0825 11/05/20 0500 11/09/20 0451  Weight: 71.5 kg 72.8 kg 70.5 kg    Examination:  General exam: Appears calm and comfortable  Respiratory system: Clear to auscultation. Respiratory effort normal. Cardiovascular system: S1 & S2 heard, RRR. No JVD, murmurs, rubs, gallops or clicks. No pedal edema. Gastrointestinal system: Abdomen is nondistended, soft and nontender. No organomegaly or masses felt. Normal  bowel sounds heard. Central nervous system: No gross focal abnormalities Extremities: Symmetric 5 x 5  power.  Left hip drain in place Skin: No rashes, lesions or ulcers Psychiatry: Unable to assess   Data Reviewed: I have personally reviewed following labs and imaging studies  CBC: Recent Labs  Lab 11/07/20 0132 11/08/20 0136 11/09/20 0236 11/10/20 0334 11/11/20 0252  WBC 7.4 5.5 5.4 6.1 9.7  NEUTROABS 4.9 2.9 2.8 3.4 6.4  HGB 9.5* 9.6* 9.0* 9.3* 9.3*  HCT 28.5* 27.4* 26.0* 26.4* 27.8*  MCV 79.6* 76.8* 78.8* 78.3* 80.1  PLT 499* 405* 531* 505* 400   Basic Metabolic Panel: Recent Labs  Lab 11/06/20 0146 11/08/20 0136 11/09/20 0236 11/10/20 0334  NA 136 136 140 144  K 3.9 4.1 4.0 3.9  CL 105 107 110 110  CO2 26 21* 23 24  GLUCOSE 54* 196* 303* 354*  BUN CREATININE 0.71 0.79 0.88 1.00  CALCIUM 7.6* 7.7* 7.9* 8.1*  MG  --  2.0 2.0 1.8  PHOS  --  2.8 2.7 2.6   GFR: Estimated Creatinine Clearance: 60.7 mL/min (by C-G formula based on SCr of 1 mg/dL). Liver Function Tests: Recent Labs  Lab 11/06/20 0146 11/09/20 0236 11/10/20 0334  AST ALT ALKPHOS 242* 263* 305*  BILITOT 0.6 0.4 0.7  PROT 7.0 7.9 8.1  ALBUMIN 1.1* 1.2* 1.3*   No results for input(s): LIPASE, AMYLASE in the last 168 hours. No results for input(s): AMMONIA in the last 168 hours. Coagulation Profile: No results for input(s): INR, PROTIME in the last 168 hours. Cardiac Enzymes: No results for input(s): CKTOTAL, CKMB, CKMBINDEX, TROPONINI in the last 168 hours. BNP (last 3 results) No results for input(s): PROBNP in the last 8760 hours. HbA1C: No results for input(s): HGBA1C in the last 72 hours. CBG: Recent Labs  Lab 11/11/20 1142 11/11/20 1315 11/11/20 1758 11/11/20 2119 11/12/20 0746  GLUCAP 198* 178* 182* 165* 151*   Lipid Profile: No results for input(s): CHOL, HDL, LDLCALC, TRIG, CHOLHDL, LDLDIRECT in the last 72 hours. Thyroid Function Tests: No results for input(s): TSH, T4TOTAL, FREET4, T3FREE, THYROIDAB in the last 72 hours. Anemia  Panel: Recent Labs    11/10/20 0334  VITAMINB12 332  FOLATE 7.9  FERRITIN 559*  TIBC 133*  IRON 25*  RETICCTPCT 2.2   Sepsis Labs: No results for input(s): PROCALCITON, LATICACIDVEN in the last 168 hours.  Recent Results (from the past 240 hour(s))  Resp Panel by RT-PCR (Flu A&B, Covid) Nasopharyngeal Swab     Status: None   Collection Time: 11/10/20  3:16 AM   Specimen: Nasopharyngeal Swab; Nasopharyngeal(NP) swabs in vial transport medium  Result Value Ref Range Status   SARS Coronavirus 2 by RT PCR NEGATIVE NEGATIVE Final    Comment: (NOTE) SARS-CoV-2 target nucleic acids are NOT DETECTED.  The SARS-CoV-2 RNA is generally detectable in upper respiratory specimens during the acute phase of infection. The lowest concentration of SARS-CoV-2 viral copies this assay can detect is 138 copies/mL. A negative result does not preclude SARS-Cov-2 infection and should not be used as the sole basis for treatment or other patient management decisions. A negative result may occur with  improper specimen collection/handling, submission of specimen other than nasopharyngeal swab, presence of viral mutation(s) within the areas targeted by this assay, and inadequate number of viral copies(<138 copies/mL). A negative result must be combined with clinical observations, patient history, and epidemiological information. The expected  result is Negative.  Fact Sheet for Patients:  BloggerCourse.com  Fact Sheet for Healthcare Providers:  SeriousBroker.it  This test is no t yet approved or cleared by the [place] FDA and  has been authorized for detection and/or diagnosis of SARS-CoV-2 by FDA under an Emergency Use Authorization (EUA). This EUA will remain  in effect (meaning this test can be used) for the duration of the COVID-19 declaration under Section 564(b)(1) of the Act, 21 U.S.C.section 360bbb-3(b)(1), unless the authorization is  terminated  or revoked sooner.       Influenza A by PCR NEGATIVE NEGATIVE Final   Influenza B by PCR NEGATIVE NEGATIVE Final    Comment: (NOTE) The Xpert Xpress SARS-CoV-2/FLU/RSV plus assay is intended as an aid in the diagnosis of influenza from Nasopharyngeal swab specimens and should not be used as a sole basis for treatment. Nasal washings and aspirates are unacceptable for Xpert Xpress SARS-CoV-2/FLU/RSV testing.  Fact Sheet for Patients: BloggerCourse.com  Fact Sheet for Healthcare Providers: SeriousBroker.it  This test is not yet approved or cleared by the [place] FDA and has been authorized for detection and/or diagnosis of SARS-CoV-2 by FDA under an Emergency Use Authorization (EUA). This EUA will remain in effect (meaning this test can be used) for the duration of the COVID-19 declaration under Section 564(b)(1) of the Act, 21 U.S.C. section 360bbb-3(b)(1), unless the authorization is terminated or revoked.  Performed at [facility], [address] [place], [place] [address]          Radiology Studies: No results found.      Scheduled Meds: . brimonidine  1 drop Both Eyes TID  . Chlorhexidine Gluconate Cloth  6 each Topical Daily  . diltiazem  60 mg Oral Q8H  . feeding supplement (GLUCERNA SHAKE)  237 mL Oral TID PC & HS  . heparin injection (subcutaneous)  5,000 Units Subcutaneous Q8H  . insulin aspart  0-5 Units Subcutaneous QHS  . insulin aspart  0-9 Units Subcutaneous TID WC  . insulin glargine  10 Units Subcutaneous QHS  . latanoprost  1 drop Both Eyes QHS  . metoprolol tartrate  50 mg Oral BID  . metoprolol tartrate  5 mg Intravenous Once  . mirtazapine  7.5 mg Oral QHS  . multivitamin with minerals  1 tablet Oral Daily  . mupirocin ointment   Topical BID  . pantoprazole sodium  40 mg Oral Daily  . rifampin  300 mg Oral Q12H  . sodium chloride flush  5 mL Intracatheter Q8H    Continuous Infusions: . sodium chloride 250 mL (11/01/20 2145)  .  ceFAZolin (ANCEF) IV 2 g (11/12/20 0647)     LOS: 22 days     Alwyn Ren, MD 11/12/2020, 10:13 AM

## 2020-11-13 LAB — COMPREHENSIVE METABOLIC PANEL
ALT: 6 U/L (ref 0–44)
AST: 19 U/L (ref 15–41)
Albumin: 1.1 g/dL — ABNORMAL LOW (ref 3.5–5.0)
Alkaline Phosphatase: 228 U/L — ABNORMAL HIGH (ref 38–126)
Anion gap: 12 (ref 5–15)
BUN: 34 mg/dL — ABNORMAL HIGH (ref 8–23)
CO2: 22 mmol/L (ref 22–32)
Calcium: 7.5 mg/dL — ABNORMAL LOW (ref 8.9–10.3)
Chloride: 112 mmol/L — ABNORMAL HIGH (ref 98–111)
Creatinine, Ser: 4.79 mg/dL — ABNORMAL HIGH (ref 0.61–1.24)
GFR, Estimated: 12 mL/min — ABNORMAL LOW (ref >60)
Glucose, Bld: 251 mg/dL — ABNORMAL HIGH (ref 70–99)
Potassium: 3.9 mmol/L (ref 3.5–5.1)
Sodium: 146 mmol/L — ABNORMAL HIGH (ref 135–145)
Total Bilirubin: 0.5 mg/dL (ref 0.3–1.2)
Total Protein: 7.4 g/dL (ref 6.5–8.1)

## 2020-11-13 LAB — CBC
HCT: 27.9 % — ABNORMAL LOW (ref 39.0–52.0)
Hemoglobin: 9.2 g/dL — ABNORMAL LOW (ref 13.0–17.0)
MCH: 26.9 pg (ref 26.0–34.0)
MCHC: 33 g/dL (ref 30.0–36.0)
MCV: 81.6 fL (ref 80.0–100.0)
Platelets: 320 10*3/uL (ref 150–400)
RBC: 3.42 MIL/uL — ABNORMAL LOW (ref 4.22–5.81)
RDW: 21.3 % — ABNORMAL HIGH (ref 11.5–15.5)
WBC: 9.9 10*3/uL (ref 4.0–10.5)
nRBC: 0 % (ref 0.0–0.2)

## 2020-11-13 LAB — CREATININE, SERUM
Creatinine, Ser: 5.07 mg/dL — ABNORMAL HIGH (ref 0.61–1.24)
GFR, Estimated: 11 mL/min — ABNORMAL LOW (ref >60)

## 2020-11-13 LAB — GLUCOSE, CAPILLARY
Glucose-Capillary: 285 mg/dL — ABNORMAL HIGH (ref 70–99)
Glucose-Capillary: 272 mg/dL — ABNORMAL HIGH (ref 70–99)
Glucose-Capillary: 206 mg/dL — ABNORMAL HIGH (ref 70–99)
Glucose-Capillary: 187 mg/dL — ABNORMAL HIGH (ref 70–99)

## 2020-11-13 MED ORDER — CEFAZOLIN SODIUM-DEXTROSE 1-4 GM/50ML-% IV SOLN
1.0000 g | Freq: Two times a day (BID) | INTRAVENOUS | Status: DC
  Administered 2020-11-14 – 2020-11-19 (×12): 1 g via INTRAVENOUS
  Filled 2020-11-13 (×13): qty 50

## 2020-11-13 NOTE — Progress Notes (Signed)
PROGRESS NOTE    Marcus Downs.  BJS:[id number] DOB: 1941/10/10 DOA: November 02, 2020 PCP: [name], MD  Brief Narrative:79 year old African-American male with a past medical history significant for but not limited to vascular dementia, previous CVA with residual left-sided hemiplegia, essential hypertension, type 2 diabetes mellitus, dysphagia and hyperlipidemia as well as chronic urine retention with indwelling Foley catheter secondary to BPH and recent left femoral neck fracture status post left hemiarthroplasty on 08/08/2020 who presented from his nursing facility with altered mental status, agitation and acute kidney injury with electrolyte abnormalities with hyponatremia.  He was in septic shock and found to have an abscess around his arthroplasty hardware and an MSSA bacteremia and was deemed not a surgical candidate.  He was empirically treated with IV antibiotics for his MSSA infection and ended up having a left hip drain. He was transferred back to [facility] service 10/24/20. Overall his prognosis and clinical status remained poor and my colleagues have had multiple conversations with family about palliative care or hospice and family not ready yet.  On 11/08/2020 is pain to tube was removed and he was eating about 50% of his meals but this is not been adequately documented.  We will ask for a calorie count to see if the patient actually nutrition orally.  If he does not he may be hospice appropriate.  Palliative care following appreciate continue goals of care discussion.  Assessment & Plan:   Principal Problem:   MSSA bacteremia Active Problems:   Protein-calorie malnutrition, severe (HCC)   Encounter for nasogastric (NG) tube placement   Severe sepsis (HCC)   Abscess of left hip   Hypernatremia   Abscess   Acute kidney injury (nontraumatic) (HCC)   Dehydration   Elevated liver function tests   Prosthetic hip infection (HCC)   Hyperkalemia   #1 sepsis and septic shock secondary to  MSSA bacteremia and left hip abscess at prosthetic hip infection and catheter associated UTI.  He was treated with vasopressors.  Blood cultures hip abscess and urine culture all grew MSSA.  Repeat blood cultures have been negative.  Echo without any vegetation.  IR guided abscess drain in place.  On cefazolin and rifampin for a total of 6 weeks antibiotic end date is December 03, 2020.  Patient seen by IR recommending CT of the hip once his drain is less than 10 cc/day.  #2 severe malnutrition related to chronic illness/dementia as evidenced by severe fat and muscle depletion poor poor p.o. intake calorie count was ordered and appreciate dietary and I have reviewed their notes.  They recommend continuing calorie count over the weekend and following up on Monday.  Patient needs to eat at least 90% of his meals prior to discharge to skilled nursing facility.   He does not want PEG tube or Dobbhoff tube. Remeron started by palliative 11/10/2020 to increase appetite. Patient has had no significant p.o. intake the whole of yesterday.  He was not alert enough even to take medications yesterday.  I attempted to call his son [name] went into voicemail I left a voicemail for him to call me back. We will start him on normal saline 75 cc an hour since his p.o. intake is poor will be to discuss with family about goals of care. Appreciate palliative care input.  Palliative care to meet with family today. Discussed with patient's son [name] yesterday regarding guarded prognosis.  He is considering hospice.  #3 sinus tachycardia on Cardizem and metoprolol improving normal TSH.  He was  not been able to take p.o. Cardizem and metoprolol due to encephalopathy.  #4 acute metabolic encephalopathy in the setting of CVA and vascular dementia followed by palliative care.  Patient has not made any significant improvement.  MRI of the brain showed no acute process.  #5 GERD continue with Protonix  #6 AKI improving with IV  fluids  #7 hyponatremia sodium 144  #8 type 2 diabetes with uncontrolled hyperglycemia patient was placed on a sensitive sliding scale due to hypoglycemia on 11/06/2020.  On Lantus 10 units 11/10/2020 since his blood sugars were trending up.  Since starting Lantus blood sugars have been better continue the same for now.  CBG (last 3)  Recent Labs    11/12/20 1626 11/12/20 2113 11/13/20 0741  GLUCAP 149* 160* 285*     #9 pressure injury as below Pressure Injury 08/07/20 Buttocks Right;Medial;Left Stage 2 -  Partial thickness loss of dermis presenting as a shallow open injury with a red, pink wound bed without slough. (Active)  08/07/20 1914  Location: Buttocks  Location Orientation: Right;Medial;Left  Staging: Stage 2 -  Partial thickness loss of dermis presenting as a shallow open injury with a red, pink wound bed without slough.  Wound Description (Comments):   Present on Admission: Yes     Pressure Injury 11/02/20 Penis (Active)  11/02/20 0344  Location: Penis  Location Orientation:   Staging:   Wound Description (Comments):   Present on Admission:       Nutrition Problem: Severe Malnutrition Etiology: chronic illness (dementia)     Signs/Symptoms: severe fat depletion,severe muscle depletion,percent weight loss (15.4% weight loss in 9 months) Percent weight loss: 15.4 % (less than 9 months)    Interventions: Glucerna shake,MVI,Magic cup,Calorie Count  Estimated body mass index is 21.08 kg/m as calculated from the following:   Height as of this encounter: 6' (1.829 m).   Weight as of this encounter: 70.5 kg.  DVT prophylaxis: Subcu heparin  code Status: DNR Family Communication: None at bedside Disposition Plan:  Status is: Inpatient  Dispo:  Patient From:  Skilled nursing facility  Planned Disposition:  Skilled nursing facility  Expected discharge date: 11/13/2020  Medically stable for discharge:  No     Consultants:   Palliative care, ID, IR,  PCCM transfer, orthopedic surgery  Procedures: Left hip abscess drain to the left thigh 11-16-2020 Antimicrobials: Anti-infectives (From admission, onward)   Start     Dose/Rate Route Frequency Ordered Stop   11/08/20 2200  rifampin (RIFADIN) capsule 300 mg  Status:  Discontinued        300 mg Oral Every 12 hours 11/08/20 1319 11/08/20 1322   11/08/20 2200  rifampin (RIFADIN) 25 mg/mL oral suspension 300 mg        300 mg Oral Every 12 hours 11/08/20 1322     11/06/20 2200  rifampin (RIFADIN) 25 mg/mL oral suspension 300 mg  Status:  Discontinued        300 mg Per Tube Every 12 hours 11/06/20 1008 11/08/20 1319   10/26/20 1100  rifampin (RIFADIN) 300 mg in sodium chloride 0.9 % 100 mL IVPB  Status:  Discontinued        300 mg 200 mL/hr over 30 Minutes Intravenous Every 12 hours 10/26/20 1007 11/06/20 1008   10/24/20 0815  ceFAZolin (ANCEF) IVPB 2g/100 mL premix        2 g 200 mL/hr over 30 Minutes Intravenous Every 8 hours 10/24/20 0804     10/23/20 2200  ceFAZolin (ANCEF)  IVPB 2g/100 mL premix  Status:  Discontinued        2 g 200 mL/hr over 30 Minutes Intravenous Every 12 hours 10/23/20 1406 10/24/20 0804   10/22/20 1000  ceFAZolin (ANCEF) IVPB 1 g/50 mL premix  Status:  Discontinued        1 g 100 mL/hr over 30 Minutes Intravenous Every 12 hours 2019-11-29 1745 10/23/20 1406   2019-11-29 2200  ceFAZolin (ANCEF) IVPB 2g/100 mL premix        2 g 200 mL/hr over 30 Minutes Intravenous  Once 2019-11-29 1739 2019-11-29 2239   2019-11-29 2100  cefTRIAXone (ROCEPHIN) 2 g in sodium chloride 0.9 % 100 mL IVPB  Status:  Discontinued        2 g 200 mL/hr over 30 Minutes Intravenous Every 24 hours 2019-11-29 1117 2019-11-29 1129   2019-11-29 1730  linezolid (ZYVOX) IVPB 600 mg  Status:  Discontinued        600 mg 300 mL/hr over 60 Minutes Intravenous Every 12 hours 2019-11-29 1154 2019-11-29 1739   2019-11-29 1130  cefTRIAXone (ROCEPHIN) 2 g in sodium chloride 0.9 % 100 mL IVPB  Status:  Discontinued        2 g 200  mL/hr over 30 Minutes Intravenous Every 24 hours 2019-11-29 1129 2019-11-29 1739   2019-11-29 1000  meropenem (MERREM) 500 mg in sodium chloride 0.9 % 100 mL IVPB  Status:  Discontinued        500 mg 200 mL/hr over 30 Minutes Intravenous Every 12 hours 2019-11-29 0330 2019-11-29 1117   2019-11-29 0400  linezolid (ZYVOX) IVPB 600 mg        600 mg 300 mL/hr over 60 Minutes Intravenous  Once 2019-11-29 0309 2019-11-29 0654   2019-11-29 0215  ceFEPIme (MAXIPIME) 2 g in sodium chloride 0.9 % 100 mL IVPB        2 g 200 mL/hr over 30 Minutes Intravenous  Once 2019-11-29 0212 2019-11-29 0306   2019-11-29 0215  ampicillin-sulbactam (UNASYN) 1.5 g in sodium chloride 0.9 % 100 mL IVPB  Status:  Discontinued        1.5 g 200 mL/hr over 30 Minutes Intravenous  Once 2019-11-29 0212 2019-11-29 0214   2019-11-29 0215  vancomycin (VANCOCIN) IVPB 1000 mg/200 mL premix  Status:  Discontinued        1,000 mg 200 mL/hr over 60 Minutes Intravenous  Once 2019-11-29 0212 2019-11-29 0314       Subjective: He is resting in bed eyes closed Does not respond to questions or does not follow commands Not safe enough to take p.o. at this time Objective: Vitals:   11/12/20 1840 11/12/20 2031 11/13/20 0641 11/13/20 0743  BP: (!) 91/47 139/72 (!) 122/53 125/64  Pulse: 70 100 71 (!) 107  Resp: 18 19 18 18   Temp: 98 F (36.7 C) 98.9 F (37.2 C) 97.8 F (36.6 C) 97.8 F (36.6 C)  TempSrc: Oral Oral  Axillary  SpO2: 100%  100% 98%  Weight:      Height:        Intake/Output Summary (Last 24 hours) at 11/13/2020 1140 Last data filed at 11/13/2020 0548 Gross per 24 hour  Intake 235 ml  Output 515 ml  Net -280 ml   Filed Weights   11/02/20 0825 11/05/20 0500 11/09/20 0451  Weight: 71.5 kg 72.8 kg 70.5 kg    Examination:  General exam: Appears calm and comfortable  Respiratory system: Clear to auscultation. Respiratory effort normal. Cardiovascular system: S1 & S2  heard, RRR. No JVD, murmurs, rubs, gallops or clicks. No pedal  edema. Gastrointestinal system: Abdomen is nondistended, soft and nontender. No organomegaly or masses felt. Normal bowel sounds heard. Central nervous system: No gross focal abnormalities Extremities: Symmetric 5 x 5 power.  Left hip drain in place Skin: No rashes, lesions or ulcers Psychiatry: Unable to assess   Data Reviewed: I have personally reviewed following labs and imaging studies  CBC: Recent Labs  Lab 11/07/20 0132 11/08/20 0136 11/09/20 0236 11/10/20 0334 11/11/20 0252  WBC 7.4 5.5 5.4 6.1 9.7  NEUTROABS 4.9 2.9 2.8 3.4 6.4  HGB 9.5* 9.6* 9.0* 9.3* 9.3*  HCT 28.5* 27.4* 26.0* 26.4* 27.8*  MCV 79.6* 76.8* 78.8* 78.3* 80.1  PLT 499* 405* 531* 505* 400   Basic Metabolic Panel: Recent Labs  Lab 11/08/20 0136 11/09/20 0236 11/10/20 0334  NA 136 140 144  K 4.1 4.0 3.9  CL 107 110 110  CO2 21* 23 24  GLUCOSE 196* 303* 354*  BUN CREATININE 0.79 0.88 1.00  CALCIUM 7.7* 7.9* 8.1*  MG 2.0 2.0 1.8  PHOS 2.8 2.7 2.6   GFR: Estimated Creatinine Clearance: 60.7 mL/min (by C-G formula based on SCr of 1 mg/dL). Liver Function Tests: Recent Labs  Lab 11/09/20 0236 11/10/20 0334  AST 18 18  ALT 7 9  ALKPHOS 263* 305*  BILITOT 0.4 0.7  PROT 7.9 8.1  ALBUMIN 1.2* 1.3*   No results for input(s): LIPASE, AMYLASE in the last 168 hours. No results for input(s): AMMONIA in the last 168 hours. Coagulation Profile: No results for input(s): INR, PROTIME in the last 168 hours. Cardiac Enzymes: No results for input(s): CKTOTAL, CKMB, CKMBINDEX, TROPONINI in the last 168 hours. BNP (last 3 results) No results for input(s): PROBNP in the last 8760 hours. HbA1C: No results for input(s): HGBA1C in the last 72 hours. CBG: Recent Labs  Lab 11/12/20 1119 11/12/20 1344 11/12/20 1626 11/12/20 2113 11/13/20 0741  GLUCAP 170* 170* 149* 160* 285*   Lipid Profile: No results for input(s): CHOL, HDL, LDLCALC, TRIG, CHOLHDL, LDLDIRECT in the last 72  hours. Thyroid Function Tests: No results for input(s): TSH, T4TOTAL, FREET4, T3FREE, THYROIDAB in the last 72 hours. Anemia Panel: No results for input(s): VITAMINB12, FOLATE, FERRITIN, TIBC, IRON, RETICCTPCT in the last 72 hours. Sepsis Labs: No results for input(s): PROCALCITON, LATICACIDVEN in the last 168 hours.  Recent Results (from the past 240 hour(s))  Resp Panel by RT-PCR (Flu A&B, Covid) Nasopharyngeal Swab     Status: None   Collection Time: 11/10/20  3:16 AM   Specimen: Nasopharyngeal Swab; Nasopharyngeal(NP) swabs in vial transport medium  Result Value Ref Range Status   SARS Coronavirus 2 by RT PCR NEGATIVE NEGATIVE Final    Comment: (NOTE) SARS-CoV-2 target nucleic acids are NOT DETECTED.  The SARS-CoV-2 RNA is generally detectable in upper respiratory specimens during the acute phase of infection. The lowest concentration of SARS-CoV-2 viral copies this assay can detect is 138 copies/mL. A negative result does not preclude SARS-Cov-2 infection and should not be used as the sole basis for treatment or other patient management decisions. A negative result may occur with  improper specimen collection/handling, submission of specimen other than nasopharyngeal swab, presence of viral mutation(s) within the areas targeted by this assay, and inadequate number of viral copies(<138 copies/mL). A negative result must be combined with clinical observations, patient history, and epidemiological information. The expected result is Negative.  Fact Sheet for Patients:  BloggerCourse.com  Fact Sheet for Healthcare Providers:  SeriousBroker.it  This test is no t yet approved or cleared by the [place] FDA and  has been authorized for detection and/or diagnosis of SARS-CoV-2 by FDA under an Emergency Use Authorization (EUA). This EUA will remain  in effect (meaning this test can be used) for the duration of the COVID-19  declaration under Section 564(b)(1) of the Act, 21 U.S.C.section 360bbb-3(b)(1), unless the authorization is terminated  or revoked sooner.       Influenza A by PCR NEGATIVE NEGATIVE Final   Influenza B by PCR NEGATIVE NEGATIVE Final    Comment: (NOTE) The Xpert Xpress SARS-CoV-2/FLU/RSV plus assay is intended as an aid in the diagnosis of influenza from Nasopharyngeal swab specimens and should not be used as a sole basis for treatment. Nasal washings and aspirates are unacceptable for Xpert Xpress SARS-CoV-2/FLU/RSV testing.  Fact Sheet for Patients: BloggerCourse.com  Fact Sheet for Healthcare Providers: SeriousBroker.it  This test is not yet approved or cleared by the [place] FDA and has been authorized for detection and/or diagnosis of SARS-CoV-2 by FDA under an Emergency Use Authorization (EUA). This EUA will remain in effect (meaning this test can be used) for the duration of the COVID-19 declaration under Section 564(b)(1) of the Act, 21 U.S.C. section 360bbb-3(b)(1), unless the authorization is terminated or revoked.  Performed at [facility], [address] [place], [place] [address]          Radiology Studies: No results found.      Scheduled Meds: . brimonidine  1 drop Both Eyes TID  . Chlorhexidine Gluconate Cloth  6 each Topical Daily  . diltiazem  60 mg Oral Q8H  . feeding supplement (GLUCERNA SHAKE)  237 mL Oral TID PC & HS  . heparin injection (subcutaneous)  5,000 Units Subcutaneous Q8H  . insulin aspart  0-5 Units Subcutaneous QHS  . insulin aspart  0-9 Units Subcutaneous TID WC  . insulin glargine  10 Units Subcutaneous QHS  . latanoprost  1 drop Both Eyes QHS  . metoprolol tartrate  50 mg Oral BID  . metoprolol tartrate  5 mg Intravenous Once  . mirtazapine  7.5 mg Oral QHS  . multivitamin with minerals  1 tablet Oral Daily  . mupirocin ointment   Topical BID  . pantoprazole  sodium  40 mg Oral Daily  . rifampin  300 mg Oral Q12H  . sodium chloride flush  5 mL Intracatheter Q8H   Continuous Infusions: . sodium chloride 250 mL (11/01/20 2145)  . sodium chloride 75 mL/hr at 11/12/20 1044  .  ceFAZolin (ANCEF) IV 2 g (11/13/20 0548)     LOS: 23 days     Alwyn Ren, MD 11/13/2020, 11:40 AM

## 2020-11-13 NOTE — Progress Notes (Signed)
[facility] and pharmacy notified of patient creatine result of 5.07. Will continue to monitor. Ilean Skill LPN

## 2020-11-13 NOTE — Progress Notes (Signed)
PHARMACY NOTE:  ANTIMICROBIAL RENAL DOSAGE ADJUSTMENT  Current antimicrobial regimen includes a mismatch between antimicrobial dosage and estimated renal function.  As per policy approved by the Pharmacy & Therapeutics and Medical Executive Committees, the antimicrobial dosage will be adjusted accordingly.  Current antimicrobial dosage:  Cefazolin 2 gm IV Q 8 hrs  Indication: Bacteremia  Renal Function:  Estimated Creatinine Clearance: 12 mL/min (A) (by C-G formula based on SCr of 5.07 mg/dL (H)). []      On intermittent HD, scheduled: []      On CRRT    Antimicrobial dosage has been changed to:  Cefazolin 1 gm IV Q 12 hrs  Pharmacy will continue to monitor patient's renal function and adjust cefazolin dosing as needed.  Thank you for allowing pharmacy to be a part of this patient's care.  , PharmD, BCPS, [facility] Clinical Pharmacist 11/13/2020 7:55 PM

## 2020-11-13 NOTE — Progress Notes (Signed)
Daily Progress Note   Patient Name: Marcus Downs.       Date: 11/13/2020 DOB: Jun 23, 1941  Age: 79 y.o. MRN#: [id number] Attending Physician: Georgette Shell, MD Primary Care Physician: Ria Bush, MD Admit Date: 10/22/2020  Reason for Consultation/Follow-up: To discuss complex medical decision making related to patient's goals of care  Met with patient at bedside.  He is awake and responsive but I am unable to understand 90% of his words.   I attempted to feed him multiple items from his lunch tray.  He took several bites but would not seem to realize there was food in his mouth and it would take several queues with each bite before he would swallow.  He asked for water.   Subjective: His 2 sons, DIL and sister arrived for a Palliative family meeting.  At first we met at bedside and then we talked privately in a conference room.  Discussed at length dementia and the natural progression towards not eating/drinking as was as worsening recurrent aspiration pneumonia.  Talked specifically about Mr. Gougeon decline becoming significantly faster after his hip fracture in September.  Then the development of UTI, bacteremia and hip abscess this admission has caused him to have a new baseline.  He is no longer mobile and he is dependent for ADLs.    Explained that the patient is on IV fluids artificially hydrating him thus currently he looks better than he would naturally appear.  At SNF where he was not receiving IVF he would likely wither in weeks to possibly months.  Described the cycle of dehydration / rehydration as an indicator of end of life with dementia patients.  Family asked for clear succinct prognosis - I responded that he has weeks to months depending on how much he takes in and  whether or not the infection recurs when the antibiotics are complete.  This is likely as good as he will get and it with be a down hill path going forward.  Discussed disposition options.  Described Hospice facility, Hospice at long term nursing facility, or SNF with Palliative in detail.  Family is in unanimous agreement that they want him to have his complete course of IV antibiotics.  Thus, Hospice is not an option for him.  Best option at this point is SNF (if they  will administer IV antibiotics) with Palliative to follow.  When he completes IV antibiotics the family can reassess - is he eating and drinking or is he sleeping most of the time  - and determine when Hospice will be appropriate.  A MOST form was presented and completed.  Family chose DNR, Limited interventions (they want him to return to the hospital for now), Yes antibiotics, IV fluids for a defined trial period, NO feeding tube. When we returned to the room Mr. Forstrom was sitting up drinking Glucerna.  Family was very pleasant and professional thru out the meeting.  I feel they have a clear understanding of Mr. Tullis prognosis.   Assessment:  Patient is very fragile and at a high risk of acute decline due to recurrent aspiration pneumonia, hip abscess and hardware infection, bedbound status, vascular dementia limiting his desire and ability to eat/drink.      Patient Profile/HPI:  79 y.o.malewith past medical history of stroke, vascular dementia, hypertension, hyperlipidemia, diabetes, GERD, dysphagia, chronic urinary retention with indwelling catheter due to BPH, L hemiarthroplasty 09/2021admitted on 12/4/2021from SNFwith[facility]I and sepsis.CT A/P found left hip abscess with MSSA bacteremia. Overall prognosis poor. Palliative care requested to continue goals of care conversations with family.    Length of Stay: 23   Vital Signs: BP 125/64   Pulse (!) 107   Temp 97.8 F (36.6 C) (Axillary)   Resp 18   Ht 6' (1.829 m)    Wt 70.5 kg   SpO2 98%   BMI 21.08 kg/m  SpO2: SpO2: 98 % O2 Device: O2 Device: Room Air O2 Flow Rate:         Palliative Assessment/Data: 20%     Palliative Care Plan    Recommendations/Plan:  Family wants patient to complete his IV antibiotic course.  If he is discharged he must go on Palliative until IV antibiotics are completed.  Then family and Palliative can decide if he is appropriate for Hospice.  Would DC to SNF if they will give IV antibiotics.  Have Palliative follow at SNF.  Feeding assistance and Aspiration precautions at SNF.  Family understands he will decline and has a prognosis of weeks to months.  Code Status:  DNR, however with full scope treatment.  Prognosis:  Less than 6 months.  Likely weeks to months.  Discharge Planning:  [place] for rehab with Palliative care service follow-up  Care plan was discussed with family. Carrolyn Meiers, Levander Campion, and [place] (Names are likely spelled incorrectly)  Thank you for allowing the Palliative Medicine Team to assist in the care of this patient.  Total time spent:  80 min.     Greater than 50%  of this time was spent counseling and coordinating care related to the above assessment and plan.  Florentina Jenny, PA-C Palliative Medicine  Please contact Palliative MedicineTeam phone at [phone] for questions and concerns between 7 am - 7 pm.   Please see AMION for individual provider pager numbers.

## 2020-11-13 NOTE — Progress Notes (Addendum)
Calorie Count Note  Calorie count ordered.  Diet: Dysphagia 1 with thin liquids  Supplements: Glucerna shakes po TID, Magic Cup po TID  Day 1 Results (11/07/20): Breakfast: no meal documentation available Lunch: 159 kcals, 11 grams of protein Dinner: no meal documentation available Supplements: 290 kcals, 9 grams of protein  Total intake: 449 kcal (23% of minimum estimated needs)  20g protein (21% of minimum estimated needs)  Unable to speak towards consumption of Glucerna for this day due to lack of RN documentation.   Day 2 Results (11/08/20) Breakfast: 50 kcals, 0 grams of protein Lunch: 35 kcals, 0 grams of protein Dinner: 27 kcals, 2 grams of protein Supplements: 1090 kcals, 37 grams of protein  Total intake: 1202 kcal (63% of minimum estimated needs)  39 grams of protein (41% of minimum estimated needs)  Day 3 Results (11/09/20) Breakfast: 144 kcal, 19 grams protein Lunch: 128 kcal, 2 grams protein Dinner: 85 kcal, 11 grams protein Supplements: 892 kcal, 37 grams protein  Total intake: 1249 kcal (65% of minimum estimated needs)  69 grams protein (72% of minimum estimated needs)   Estimated Nutritional Needs:  Kcal:  1900-2100 Protein:  95-115 grams Fluid:  >/= 2.0 L   No meal documentation was obtained over the weekend. Unable to provide any additional information regarding pt's po consumption. Will not continue calorie count given lack of RN documentation. Note that Palliative Medicine is to meet with pt and pt's family today to discuss [place]. Pt has indicated he does not want another feeding tube.   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 -- ongoing  Goal: Patient will meet greater than or equal to 90% of their needs -- not met  Intervention:  D/c calorie count  Monitor for results of [place]  discussion  Glucerna Shake po QID, each supplement provides 220 kcal and 10 grams of protein  Continue Magic cup TID with meals, each supplement provides 290 kcal and 9 grams of protein  Continue MVI daily   Larkin Ina, MS, RD, LDN RD pager number and weekend/on-call pager number located in [place].

## 2020-11-13 NOTE — Plan of Care (Signed)
  Problem: Education: Goal: Knowledge of General Education information will improve Description Including pain rating scale, medication(s)/side effects and non-pharmacologic comfort measures Outcome: Progressing   

## 2020-11-14 ENCOUNTER — Inpatient Hospital Stay (HOSPITAL_COMMUNITY): Payer: Medicare Other

## 2020-11-14 DIAGNOSIS — F0150 Vascular dementia without behavioral disturbance: Secondary | ICD-10-CM

## 2020-11-14 LAB — GLUCOSE, CAPILLARY
Glucose-Capillary: 132 mg/dL — ABNORMAL HIGH (ref 70–99)
Glucose-Capillary: 130 mg/dL — ABNORMAL HIGH (ref 70–99)
Glucose-Capillary: 108 mg/dL — ABNORMAL HIGH (ref 70–99)
Glucose-Capillary: 105 mg/dL — ABNORMAL HIGH (ref 70–99)

## 2020-11-14 LAB — CBC WITH DIFFERENTIAL/PLATELET
Abs Immature Granulocytes: 0.04 10*3/uL (ref 0.00–0.07)
Basophils Absolute: 0.1 10*3/uL (ref 0.0–0.1)
Basophils Relative: 1 %
Eosinophils Absolute: 0.7 10*3/uL — ABNORMAL HIGH (ref 0.0–0.5)
Eosinophils Relative: 8 %
HCT: 23.2 % — ABNORMAL LOW (ref 39.0–52.0)
Hemoglobin: 8.1 g/dL — ABNORMAL LOW (ref 13.0–17.0)
Immature Granulocytes: 1 %
Lymphocytes Relative: 16 %
Lymphs Abs: 1.2 10*3/uL (ref 0.7–4.0)
MCH: 27.6 pg (ref 26.0–34.0)
MCHC: 34.9 g/dL (ref 30.0–36.0)
MCV: 78.9 fL — ABNORMAL LOW (ref 80.0–100.0)
Monocytes Absolute: 0.4 10*3/uL (ref 0.1–1.0)
Monocytes Relative: 5 %
Neutro Abs: 5.5 10*3/uL (ref 1.7–7.7)
Neutrophils Relative %: 69 %
Platelets: 343 10*3/uL (ref 150–400)
RBC: 2.94 MIL/uL — ABNORMAL LOW (ref 4.22–5.81)
RDW: 20.8 % — ABNORMAL HIGH (ref 11.5–15.5)
WBC: 7.9 10*3/uL (ref 4.0–10.5)
nRBC: 0 % (ref 0.0–0.2)

## 2020-11-14 LAB — BASIC METABOLIC PANEL
Anion gap: 10 (ref 5–15)
BUN: 35 mg/dL — ABNORMAL HIGH (ref 8–23)
CO2: 20 mmol/L — ABNORMAL LOW (ref 22–32)
Calcium: 7.6 mg/dL — ABNORMAL LOW (ref 8.9–10.3)
Chloride: 117 mmol/L — ABNORMAL HIGH (ref 98–111)
Creatinine, Ser: 5.34 mg/dL — ABNORMAL HIGH (ref 0.61–1.24)
GFR, Estimated: 10 mL/min — ABNORMAL LOW (ref >60)
Glucose, Bld: 179 mg/dL — ABNORMAL HIGH (ref 70–99)
Potassium: 3.3 mmol/L — ABNORMAL LOW (ref 3.5–5.1)
Sodium: 147 mmol/L — ABNORMAL HIGH (ref 135–145)

## 2020-11-14 LAB — URINALYSIS, ROUTINE W REFLEX MICROSCOPIC
Bilirubin Urine: NEGATIVE
Glucose, UA: NEGATIVE mg/dL
Ketones, ur: NEGATIVE mg/dL
Nitrite: NEGATIVE
Protein, ur: 30 mg/dL — AB
Specific Gravity, Urine: 1.009 (ref 1.005–1.030)
WBC, UA: >50 WBC/hpf — ABNORMAL HIGH (ref 0–5)
pH: 6 (ref 5.0–8.0)

## 2020-11-14 LAB — SODIUM, URINE, RANDOM: Sodium, Ur: 66 mmol/L

## 2020-11-14 LAB — MAGNESIUM: Magnesium: 2.2 mg/dL (ref 1.7–2.4)

## 2020-11-14 LAB — PHOSPHORUS: Phosphorus: 4.4 mg/dL (ref 2.5–4.6)

## 2020-11-14 LAB — HEPATIC FUNCTION PANEL
ALT: <5 U/L (ref 0–44)
AST: 15 U/L (ref 15–41)
Albumin: 1 g/dL — ABNORMAL LOW (ref 3.5–5.0)
Alkaline Phosphatase: 202 U/L — ABNORMAL HIGH (ref 38–126)
Bilirubin, Direct: 0.1 mg/dL (ref 0.0–0.2)
Indirect Bilirubin: 0.2 mg/dL — ABNORMAL LOW (ref 0.3–0.9)
Total Bilirubin: 0.3 mg/dL (ref 0.3–1.2)
Total Protein: 7.2 g/dL (ref 6.5–8.1)

## 2020-11-14 LAB — CREATININE, URINE, RANDOM: Creatinine, Urine: 46.42 mg/dL

## 2020-11-14 LAB — OSMOLALITY, URINE: Osmolality, Ur: 224 mOsm/kg — ABNORMAL LOW (ref 300–900)

## 2020-11-14 MED ORDER — CALCIUM GLUCONATE-NACL 1-0.675 GM/50ML-% IV SOLN
1.0000 g | Freq: Once | INTRAVENOUS | Status: AC
  Administered 2020-11-14: 1000 mg via INTRAVENOUS
  Filled 2020-11-14: qty 50

## 2020-11-14 MED ORDER — POTASSIUM CHLORIDE 10 MEQ/100ML IV SOLN
10.0000 meq | INTRAVENOUS | Status: AC
  Administered 2020-11-14 (×3): 10 meq via INTRAVENOUS
  Filled 2020-11-14 (×3): qty 100

## 2020-11-14 MED ORDER — SODIUM CHLORIDE 0.9 % IV SOLN: INTRAVENOUS | Status: DC

## 2020-11-14 MED ORDER — DEXTROSE 5 % IV SOLN: INTRAVENOUS | Status: DC

## 2020-11-14 MED ORDER — SODIUM CHLORIDE 0.9 % IV BOLUS
500.0000 mL | Freq: Once | INTRAVENOUS | Status: AC
  Administered 2020-11-14: 500 mL via INTRAVENOUS

## 2020-11-14 NOTE — Consult Note (Addendum)
[facility] Nephrology Consultation Note  Requesting MD: Dr Marland Mcalpine, Kateri Mc Reason for consult: AKI  HPI:  Marcus Downs. is a 79 y.o. male with past medical history of stroke with residual left-sided hemiplegia, vascular dementia, hypertension, DM, dysphagia, HLD, chronic urinary retention on indwelling Foley catheter due to BPH, recent left femoral neck fracture required hemiarthroplasty in 07/2020 admitted on 12/4 from SNF for the concern of UTI, sepsis seen as a consultation at the request of Dr. Marland Mcalpine for the evaluation of AKI. The patient has normal serum creatinine level at baseline.  Developed AKI with peak creatinine level of 3.98 on 12/4 which improved with IV hydration.  The creatinine level was running around 0.7-1.1 since then however when last checked yesterday after few days of break the creatinine level was elevated to 4.79 and further increased to 5.34 today. During hospitalization patient was found to have MSSA bacteremia, septic shock.  The blood pressure improved.  He is on cefazolin and rifampicin for sepsis treatment.  Seen by ID.  Apparently patient has very poor oral intake and has been refusing feeding tube placement.  The palliative care was consulted because of poor prognosis and had multiple conversation with family members. He is also on Cardizem and metoprolol for sinus tachycardia. Today, he is lying on bed confused.  Very dry on exam.  He has indwelling Foley catheter and bag has around 400 cc of urine, clear.  He had episode of hypotension, systolic BP 90s on 12/26.  No use of IV contrast recently and no other offending medication noted. The patient is getting D5W IV because of very poor oral intake.  Urinalysis done on 12/16 was cloudy, red bacteria and WBC.  The kidney ultrasound with unremarkable kidneys however moderate amount of urine in the bladder despite of Foley catheter.   Creatinine, Ser  Date/Time Value Ref Range Status  11/14/2020 03:34  AM 5.34 (H) 0.61 - 1.24 mg/dL Final  32/95/1884 16:60 PM 5.07 (H) 0.61 - 1.24 mg/dL Final  63/11/6008 93:23 PM 4.79 (H) 0.61 - 1.24 mg/dL Final  55/73/2202 54:27 AM 1.00 0.61 - 1.24 mg/dL Final    PMHx:   Past Medical History:  Diagnosis Date  . CRI (chronic renal insufficiency)   . 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 Diabetes mellitus type II 1992  . HLD (hyperlipidemia) 10/1998  . HTN (hypertension) 10/1998  . Primary open angle glaucoma    [name]  . RBBB   . Stroke [facility])    Left sided weakness    Past Surgical History:  Procedure Laterality Date  . CATARACT EXTRACTION W/PHACO  09/09/2012   Procedure: CATARACT EXTRACTION PHACO AND INTRAOCULAR LENS PLACEMENT (IOC);  Surgeon: [name], MD;  Location: [facility] OR;  Service: Ophthalmology;  Laterality: Right;  . CATARACT EXTRACTION W/PHACO Left 03/31/2013   Procedure: CATARACT EXTRACTION PHACO AND INTRAOCULAR LENS PLACEMENT (IOC);  Surgeon: [name], MD;  Location: [facility] OR;  Service: Ophthalmology;  Laterality: Left;  . EYE SURGERY    . HIP ARTHROPLASTY Left 08/07/2020   Procedure: ARTHROPLASTY BIPOLAR HIP (HEMIARTHROPLASTY);  Surgeon: [name], MD;  Location: [facility] ORS;  Service: Orthopedics;  Laterality: Left;  . lipoma removal  1970's   Right shoulder  . US ECHOCARDIOGRAPHY  03/2013   mod LVH, EF 60%, normal wall motion    Family Hx:  Family History  Problem Relation Age of Onset  . Heart attack Father 46  . Hypertension Sister  Multiple medical problems  . Diabetes Sister   . Stroke Neg Hx   . Cancer Neg Hx     Social History:  reports that he quit smoking about 24 years ago. His smoking use included cigarettes. He has a 30.00 pack-year smoking history. He has never used smokeless tobacco. He reports that he does not drink alcohol and does not use drugs.  Allergies:  Allergies  Allergen Reactions  . Tape Rash    B[place]    Medications: Prior to Admission medications    Medication Sig Start Date End Date Taking? Authorizing Provider  acetaminophen (TYLENOL) 325 MG tablet Take 650 mg by mouth every 6 (six) hours as needed for mild pain or fever. 08/14/20  Yes [provider]  Amino Acids-Protein Hydrolys (FEEDING SUPPLEMENT, PRO-STAT 64,) LIQD Take 30 mLs by mouth in the morning and at bedtime. For wound healing 08/15/20  Yes [provider]  aspirin 325 MG tablet Take 325 mg by mouth daily.   Yes [provider]  atorvastatin (LIPITOR) 40 MG tablet Take 1 tablet (40 mg total) by mouth daily. 08/14/20  Yes Rhetta Mura, MD  bisacodyl (DULCOLAX) 10 MG suppository Place 10 mg rectally as needed for moderate constipation.   Yes [provider]  brimonidine (ALPHAGAN P) 0.1 % SOLN Place 1 drop into both eyes 3 (three) times daily.   Yes [provider]  ferrous sulfate 325 (65 FE) MG tablet Take 325 mg by mouth daily.  08/31/18  Yes [name], MD  Insulin Aspart Prot & Aspart (NOVOLOG MIX 70/30 FLEXPEN [place]) Inject 14-18 Units into the skin See admin instructions. Inject 14 Units after breakfast,  Inject 18 units after lunch, inject 16 uints at dinner.   Yes [provider]  latanoprost (XALATAN) 0.005 % ophthalmic solution Place 1 drop into both eyes at bedtime.   Yes [provider]  magnesium hydroxide (MILK OF MAGNESIA) 400 MG/5ML suspension Take 30 mLs by mouth daily as needed for mild constipation.    Yes [provider]  metoprolol tartrate (LOPRESSOR) 25 MG tablet Take 1 tablet (25 mg total) by mouth 2 (two) times daily. 08/14/20  Yes Rhetta Mura, MD  MULTIPLE VITAMIN PO Take 1 tablet by mouth daily. W/ Minerals to promote wound healing   Yes [provider]  NON FORMULARY Medpass TID d/t weight loss. 09/13/20  Yes [provider]  potassium chloride (KLOR-CON) 10 MEQ tablet TAKE 3 TABLETS BY MOUTH DAILY Patient taking differently: 30 mEq daily.   09/11/20  Yes [name], MD  Sodium Phosphates (RA SALINE ENEMA RE) Place 1 enema rectally daily as needed (for constipation).   Yes [provider]  sodium chloride 0.9 % infusion Inject 1,000 mLs into the vein daily. 272ml/ 1 hour    [provider]  sulfamethoxazole-trimethoprim (BACTRIM DS) 800-160 MG tablet Take 1 tablet by mouth 2 (two) times daily. For 10 days    [provider]  sulfamethoxazole-trimethoprim (BACTRIM) 400-80 MG tablet Take 1 tablet by mouth 2 (two) times daily. For 10 days    [provider]    I have reviewed the patient's current medications.  Labs:  Results for orders placed or performed during the hospital encounter of Nov 03, 2020 (from the past 48 hour(s))  Glucose, capillary     Status: Abnormal   Collection Time: 11/12/20 11:19 AM  Result Value Ref Range   Glucose-Capillary 170 (H) 70 - 99 mg/dL    Comment: Glucose reference range  applies only to samples taken after fasting for at least 8 hours.  Glucose, capillary     Status: Abnormal   Collection Time: 11/12/20  1:44 PM  Result Value Ref Range   Glucose-Capillary 170 (H) 70 - 99 mg/dL    Comment: Glucose reference range applies only to samples taken after fasting for at least 8 hours.  Glucose, capillary     Status: Abnormal   Collection Time: 11/12/20  4:26 PM  Result Value Ref Range   Glucose-Capillary 149 (H) 70 - 99 mg/dL    Comment: Glucose reference range applies only to samples taken after fasting for at least 8 hours.  Glucose, capillary     Status: Abnormal   Collection Time: 11/12/20  9:13 PM  Result Value Ref Range   Glucose-Capillary 160 (H) 70 - 99 mg/dL    Comment: Glucose reference range applies only to samples taken after fasting for at least 8 hours.  Glucose, capillary     Status: Abnormal   Collection Time: 11/13/20  7:41 AM  Result Value Ref Range   Glucose-Capillary 285 (H) 70 - 99 mg/dL    Comment: Glucose reference range applies only to  samples taken after fasting for at least 8 hours.  Glucose, capillary     Status: Abnormal   Collection Time: 11/13/20 11:48 AM  Result Value Ref Range   Glucose-Capillary 272 (H) 70 - 99 mg/dL    Comment: Glucose reference range applies only to samples taken after fasting for at least 8 hours.  CBC     Status: Abnormal   Collection Time: 11/13/20  4:22 PM  Result Value Ref Range   WBC 9.9 4.0 - 10.5 K/uL   RBC 3.42 (L) 4.22 - 5.81 MIL/uL   Hemoglobin 9.2 (L) 13.0 - 17.0 g/dL   HCT 65.7 (L) 84.6 - 96.2 %   MCV 81.6 80.0 - 100.0 fL   MCH 26.9 26.0 - 34.0 pg   MCHC 33.0 30.0 - 36.0 g/dL   RDW 95.2 (H) 84.1 - 32.4 %   Platelets 320 150 - 400 K/uL   nRBC 0.0 0.0 - 0.2 %    Comment: Performed at [facility], [address], [place], [place] [address]  Comprehensive metabolic panel     Status: Abnormal   Collection Time: 11/13/20  4:22 PM  Result Value Ref Range   Sodium 146 (H) 135 - 145 mmol/L   Potassium 3.9 3.5 - 5.1 mmol/L   Chloride 112 (H) 98 - 111 mmol/L   CO2 22 22 - 32 mmol/L   Glucose, Bld 251 (H) 70 - 99 mg/dL    Comment: Glucose reference range applies only to samples taken after fasting for at least 8 hours.   BUN 34 (H) 8 - 23 mg/dL   Creatinine, Ser 7.25 (H) 0.61 - 1.24 mg/dL   Calcium 7.5 (L) 8.9 - 10.3 mg/dL   Total Protein 7.4 6.5 - 8.1 g/dL   Albumin 1.1 (L) 3.5 - 5.0 g/dL   AST 19 15 - 41 U/L   ALT 6 0 - 44 U/L   Alkaline Phosphatase 228 (H) 38 - 126 U/L   Total Bilirubin 0.5 0.3 - 1.2 mg/dL   GFR, Estimated 12 (L) >60 mL/min    Comment: (NOTE) Calculated using the CKD-EPI Creatinine Equation (2021)    Anion gap 12 5 - 15    Comment: Performed at [facility], [address], [place], [place] [address]  Glucose, capillary  Status: Abnormal   Collection Time: 11/13/20  5:29 PM  Result Value Ref Range   Glucose-Capillary 206 (H) 70 - 99 mg/dL    Comment: Glucose reference range applies only to samples taken after fasting for at least 8  hours.  Creatinine, serum     Status: Abnormal   Collection Time: 11/13/20  6:05 PM  Result Value Ref Range   Creatinine, Ser 5.07 (H) 0.61 - 1.24 mg/dL   GFR, Estimated 11 (L) >60 mL/min    Comment: (NOTE) Calculated using the CKD-EPI Creatinine Equation (2021) Performed at [facility], [address], [place], [place] [address]   Glucose, capillary     Status: Abnormal   Collection Time: 11/13/20  8:40 PM  Result Value Ref Range   Glucose-Capillary 187 (H) 70 - 99 mg/dL    Comment: Glucose reference range applies only to samples taken after fasting for at least 8 hours.  Basic metabolic panel     Status: Abnormal   Collection Time: 11/14/20  3:34 AM  Result Value Ref Range   Sodium 147 (H) 135 - 145 mmol/L   Potassium 3.3 (L) 3.5 - 5.1 mmol/L   Chloride 117 (H) 98 - 111 mmol/L   CO2 20 (L) 22 - 32 mmol/L   Glucose, Bld 179 (H) 70 - 99 mg/dL    Comment: Glucose reference range applies only to samples taken after fasting for at least 8 hours.   BUN 35 (H) 8 - 23 mg/dL   Creatinine, Ser 3.24 (H) 0.61 - 1.24 mg/dL   Calcium 7.6 (L) 8.9 - 10.3 mg/dL   GFR, Estimated 10 (L) >60 mL/min    Comment: (NOTE) Calculated using the CKD-EPI Creatinine Equation (2021)    Anion gap 10 5 - 15    Comment: Performed at [facility], [address] [place], [place] [address]  Glucose, capillary     Status: Abnormal   Collection Time: 11/14/20  7:55 AM  Result Value Ref Range   Glucose-Capillary 132 (H) 70 - 99 mg/dL    Comment: Glucose reference range applies only to samples taken after fasting for at least 8 hours.  Hepatic function panel     Status: Abnormal   Collection Time: 11/14/20  8:32 AM  Result Value Ref Range   Total Protein 7.2 6.5 - 8.1 g/dL   Albumin 1.0 (L) 3.5 - 5.0 g/dL   AST 15 15 - 41 U/L   ALT <5 0 - 44 U/L   Alkaline Phosphatase 202 (H) 38 - 126 U/L   Total Bilirubin 0.3 0.3 - 1.2 mg/dL   Bilirubin, Direct 0.1 0.0 - 0.2 mg/dL   Indirect Bilirubin 0.2  (L) 0.3 - 0.9 mg/dL    Comment: Performed at [facility], [address] [place], [place] [address]  CBC with Differential/Platelet     Status: Abnormal   Collection Time: 11/14/20  8:32 AM  Result Value Ref Range   WBC 7.9 4.0 - 10.5 K/uL   RBC 2.94 (L) 4.22 - 5.81 MIL/uL   Hemoglobin 8.1 (L) 13.0 - 17.0 g/dL   HCT 64.4 (L) 03.4 - 74.2 %   MCV 78.9 (L) 80.0 - 100.0 fL   MCH 27.6 26.0 - 34.0 pg   MCHC 34.9 30.0 - 36.0 g/dL   RDW 59.5 (H) 63.8 - 75.6 %   Platelets 343 150 - 400 K/uL   nRBC 0.0 0.0 - 0.2 %   Neutrophils Relative % 69 %  Neutro Abs 5.5 1.7 - 7.7 K/uL   Lymphocytes Relative 16 %   Lymphs Abs 1.2 0.7 - 4.0 K/uL   Monocytes Relative 5 %   Monocytes Absolute 0.4 0.1 - 1.0 K/uL   Eosinophils Relative 8 %   Eosinophils Absolute 0.7 (H) 0.0 - 0.5 K/uL   Basophils Relative 1 %   Basophils Absolute 0.1 0.0 - 0.1 K/uL   Immature Granulocytes 1 %   Abs Immature Granulocytes 0.04 0.00 - 0.07 K/uL    Comment: Performed at [facility], [address] [place], [place] [address]  Magnesium     Status: None   Collection Time: 11/14/20  8:32 AM  Result Value Ref Range   Magnesium 2.2 1.7 - 2.4 mg/dL    Comment: Performed at [facility], [address] [place], [place] [address]  Phosphorus     Status: None   Collection Time: 11/14/20  8:32 AM  Result Value Ref Range   Phosphorus 4.4 2.5 - 4.6 mg/dL    Comment: Performed at [facility], [address] [place], [place] [address]     ROS: Unable to obtain review of system as patient is confused and has dementia.  Physical Exam: Vitals:   11/14/20 0501 11/14/20 0836  BP: (!) 120/54 (!) 127/54  Pulse: 100 97  Resp: 18   Temp: 98 F (36.7 C)   SpO2: 100%      General exam: Confused male lying in bed, dry mucous membrane Respiratory system: Clear to auscultation. Respiratory effort normal. No wheezing or crackle Cardiovascular system: S1 & S2 heard, RRR.  No pedal edema. Gastrointestinal  system: Abdomen is nondistended, soft and nontender. Normal bowel sounds heard. Central nervous system: Alert but not oriented. Extremities: No edema Skin: No rashes, lesions or ulcers Psychiatry: Judgement and insight impaired GU: Indwelling Foley catheter and urinary bag has clear urine.  Assessment/Plan:  #Acute kidney injury, nonoliguric: Likely due to severe dehydration concomitant with hemodynamic change/hypotension.  He is already predisposed to AKI because of previous AKI, sepsis/MSSA bacteremia and chronic bladder outlet obstruction.  Unable to rule out AIN related to antibiotics use. Repeat urinalysis, order bladder scan.  The kidney ultrasound ruled out obstruction. Order 500 cc of NS bolus and then start NS IVF @125  cc/hr. Strict ins and out, avoid hypotensive episode or nephrotoxins. Given severe dementia, very poor functional status and multiple comorbidities, he is not a candidate for dialysis treatment.  Noted he is DNR and he was seen by palliative care team.  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  Blood culture growing MSSA and currently on cefazolin and rifampicin.  Echo without any vegetation.  For now continue antibiotics.  #Severe vascular dementia, stroke and may have some acute metabolic encephalopathy: MRI of the brain with chronic infarction and cerebral atrophy.  Continue supportive and palliative care.  #Severe protein calorie malnutrition, chronic dysphagia: Very poor oral intake per nursing staff.  Per report he declined feeding tube placement.  Starting IV fluid for hydration.  #Hyponatremia probably due to dehydration.  Starting IV fluid.  #Hypokalemia: Replacing IV potassium chloride.  Encourage oral intake.  Thank you for the consult.  We will follow with you.  Marcus Downs 11/14/2020, 10:58 AM  [facility].

## 2020-11-14 NOTE — Progress Notes (Signed)
Daily Progress Note   Patient Name: Marcus Downs.       Date: 11/14/2020 DOB: [birth year]  Age: 79 y.o. MRN#: [id number] Attending Physician: Kerney Elbe, DO Primary Care Physician: Ria Bush, MD Admit Date: 11/03/2020  Reason for Consultation/Follow-up:  To discuss complex medical decision making related to patient's goals of care  Noted patient's bmet that was taken yesterday afternoon.  The creatinine was 4.79 and today 12/28 it has risen to 5.34.  Work up ordered and Nephrology consulted.    Nutrition notes indicate that for his last 5 meals the patient has eaten and average of 28%   Subjective: Was able to reach [place] by telephone today.   After we met yesterday the family returned to the room and noticed the patient twitching.  This prompted Dr. Rodena Piety to order a bmet which showed the elevated creatinine.  Patient still making urine.  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  We will work over the next couple of days to improve his kidney function but if it continues to get worse we will need a very different approach.  Marcus Downs received the information and committed to pass the news to the rest of his family.   Assessment: Patient bed bound, intermittently confused, poor PO intake, with MSSA infection in hip hardware.  Now with recurrent acute kidney failure.  Nephrology on board.  Family aware patient's prognosis is poor.   Patient Profile/HPI:  79 y.o.malewith past medical history of stroke, vascular dementia, hypertension, hyperlipidemia, diabetes, GERD, dysphagia, chronic urinary retention with indwelling catheter due to BPH, L hemiarthroplasty 09/2021admitted on 12/4/2021from SNFwithUTI and sepsis.CT A/P found left hip abscess with MSSA  bacteremia. Overall prognosis poor. Palliative care requested to continue goals of care conversations with family.     Length of Stay: 24   Vital Signs: BP (!) 127/54    Pulse 97    Temp 98 F (36.7 C) (Oral)    Resp 18    Ht 6' (1.829 m)    Wt 70.5 kg    SpO2 100%    BMI 21.08 kg/m  SpO2: SpO2: 100 % O2 Device: O2 Device: Room Air O2 Flow Rate:         Palliative Assessment/Data: 20%     Palliative Care Plan  Recommendations/Plan:  Continue current efforts.  Hopeful to continue IV antibiotics at SNF with Palliative however, If kidney function continues to worsen - will recommend Hospice House  Code Status:  DNR  Prognosis:   At high risk of acute decline and death.  Most likely he has weeks.  Discharge Planning:  To Be Determined  Care plan was discussed with Attending physician and son Marcus Downs.  Thank you for allowing the Palliative Medicine Team to assist in the care of this patient.  Total time spent:  35 min.     Greater than 50%  of this time was spent counseling and coordinating care related to the above assessment and plan.  Florentina Jenny, PA-C Palliative Medicine  Please contact Palliative MedicineTeam phone at [phone] for questions and concerns between 7 am - 7 pm.   Please see AMION for individual provider pager numbers.

## 2020-11-14 NOTE — Progress Notes (Signed)
Nutrition Follow-up  DOCUMENTATION CODES:   Severe malnutrition in context of chronic illness  INTERVENTION:   Continue Glucerna Shake po QID, each supplement provides 220 kcal and 10 grams of protein  ContinueMagic cup TID with meals, each supplement provides 290 kcal and 9 grams of protein  ContinueMVI daily   NUTRITION DIAGNOSIS:   Severe Malnutrition related to chronic illness (dementia) as evidenced by severe fat depletion,severe muscle depletion,percent weight loss (15.4% weight loss in 9 months).  ongoing  GOAL:   Patient will meet greater than or equal to 90% of their needs  Not met  MONITOR:   PO intake,Supplement acceptance,Diet advancement,Skin,Weight trends,Labs,I & O's  REASON FOR ASSESSMENT:   Consult Enteral/tube feeding initiation and management  ASSESSMENT:   Pt admitted with severe sepsis secondary to UTI, MSSA bacteremia/sepsis 2/2 MSSA L hip abscess with septic shock. PMH of HTN, vascular dementia, T2DM, GERD, dysphagia, chronic urinary retention with indwelling foley catheter, HLD, previous CVA with left hemiparesis, left femoral neck fracture s/p left hemiarthroplasty in September 2021.  12/04 - s/p CT guided abscess drain placement by IR, NG tube placed 12/06 - NG tube replaced with Cortrak, tip gastric 12/20 - Cortrak removed  Palliative Medicine has been following. [place] discussion held yesterday with pt's family. Family decided they would like pt to finish his current course of antibiotics (making him ineligible for hospice at this point), d/c to SNF with palliative follow-up, and will make final decision regarding GOC once antibiotics are completed. Noted that pt/family still declining further nutrition support. Will continue to provide pt with oral nutrition supplements (currently with orders for Glucerna po QID and Magic Cup po TID). Staff should continue to encourage adequate po intake.   PO Intake: 1-50% x last 8 recorded meals (28% average  meal intake)  UOP: 226m documented x24 hours JP Drain: 439moutput x24 hours  Admit wt: 70 kg Current wt: 70.5 kg  Labs: Na 147 (H), K+ 3.3 (L), CBGs 130-132 Medications: ss novolog TID w/ meals and bedtime, 10 units lantus daily, remeron, mvi with minerals  Diet Order:   Diet Order            DIET - DYS 1 Room service appropriate? Yes; Fluid consistency: Thin  Diet effective now                 EDUCATION NEEDS:   Not appropriate for education at this time  Skin:  Skin Assessment: Skin Integrity Issues: Skin Integrity Issues:: Stage II,Other (Comment) Stage II: bilateral buttocks Other: pressure injury to penis (not staged)  Last BM:  12/27 type 6  Height:   Ht Readings from Last 1 Encounters:  10/29/2020 6' (1.829 m)    Weight:   Wt Readings from Last 1 Encounters:  11/09/20 70.5 kg   BMI:  Body mass index is 21.08 kg/m.  Estimated Nutritional Needs:   Kcal:  1900-2100  Protein:  95-115 grams  Fluid:  >/= 2.0 L    Marcus Downs, Marcus Downs, Marcus Downs Marcus Downs pager number and weekend/on-call pager number located in [place]

## 2020-11-14 NOTE — Progress Notes (Signed)
PROGRESS NOTE    Marcus Downs.  KPT:[id number] DOB: June 09, 1941 DOA: 10/24/2020 PCP: Ria Bush, MD   Brief Narrative:  Patient is a 79 year old African-American male with a past medical history significant for but not limited to vascular dementia, previous CVA with residual left-sided hemiplegia, essential hypertension, type 2 diabetes mellitus, dysphagia and hyperlipidemia as well as chronic urine retention with indwelling Foley catheter secondary to BPH and recent left femoral neck fracture status post left hemiarthroplasty on 08/08/2020 who presented from his nursing facility with altered mental status, agitation and acute kidney injury with electrolyte abnormalities with hyponatremia.  He was in septic shock and found to have an abscess around his arthroplasty hardware and an MSSA bacteremia and was deemed not a surgical candidate.  He was empirically treated with IV antibiotics for his MSSA infection and ended up having a left hip drain. He was transferred back to [facility] service 10/24/20. Overall his prognosis and clinical status remained poor and my colleagues have had multiple conversations with family about palliative care or hospice and family not ready yet.  On 11/08/2020 is pain to tube was removed and he was eating about 50% of his meals but this is not been adequately documented.  We will ask for a calorie count to see if the patient actually nutrition orally.  If he does not he may be hospice appropriate.  Palliative care following appreciate continue goals of care discussion.   Interim History Intermittently he has not been awake enough to participate in Examination and so ? How much he is really taking in orally. IR evaluated and patient's left hip abscess drain is intact and there can continue to follow and recommending a repeat CT scan when his output is minimal and less than 10 cc daily.   On 11/09/2020 patient had better intake with his Magic cup and will start obstruction.   Calorie count was extended for another day.  Lantus was added for the patient on 11/10/2020.  On day 3 of his calorie count patient continued to do well with Magic cup with excellent consumption even though meal consumption was poor.  Patient blood sugars were in 300 so they continue with same as opposed to more nutritionally dense Ensure supplements.  Nutritionist recommended trialing Nucynta a fourth time to help patient meet his calorie count and protein needs.  Calorie count was continued throughout the weekend and per facility he needed to eat at least 90% of his meals prior to discharge but unfortunately no meal documentation was obtained over the weekend and nutritionist was unable to provide any additional information regarding patient's p.o consumption.Marland Kitchen  He did not want another PEG tube Dobbhoff.  In the interim patient intermittent acute kidney injury and worsening renal status.  He was trialed on mirtazapine for appetite stimulant.  Family wants to continue IV antibiotic course for now.  On 12/25 2020 when he had no significant p.o. intake and was not even alert enough to take his medication.  He was started on <5 MLS per hour since his poor p.o. intake.  After further goals of care discussion with palliative care medicine patient was changed to DO NOT RESUSCITATE.  Palliative continues to meet with the patient and the family about goals of care.  Nephrology was consulted given his AKI and they feel it is in the setting of severe dehydration with concomitant hemodynamic changes/hypotension.  Dr. Carolin Sicks is unable to rule out and related antibiotic use.  We have repeated a urinalysis, order bladder  scan and renal ultrasound which has ruled out obstruction.  Patient was given a 500 mL bolus and started on normal saline at 125 MLS per hour.  He is not a candidate for dialysis treatment per nephrology recommendations  Assessment & Plan:   Principal Problem:   MSSA bacteremia Active Problems:    Protein-calorie malnutrition, severe (HCC)   Encounter for nasogastric (NG) tube placement   Severe sepsis (HCC)   Abscess of left hip   Hypernatremia   Abscess   Acute kidney injury (nontraumatic) (HCC)   Dehydration   Elevated liver function tests   Prosthetic hip infection (HCC)   Hyperkalemia   MSSA bacteremia/sepsis secondary to MSSA left hip abscess with septic shock: Prosthetic hip infection. poA Catheter Associated UTI -Treated with vasopressors and weaned off. -Blood cultures 12/4, MSSA bacteremia -Hip abscess 12/4, MSSA -Urine culture 12/4, MSSA -Repeat blood cultures negative.  Echocardiogram without any vegetation.   -Not a good candidate for operative removal/spacer or multistage surgery. -IR guided abscess drain, continues to drain.  Overall with poor clinical improvement. -Currently remains on cefazolin and rifampicin.  Total 6 weeks of antibiotics planned. -Antibiotic end date 12/03/2020. -IR evaluated and recommending repeating CT scanning when his output from his drain is minimal less than 10 cc/day -Oral intake has been extremely poor throughout the weekend and has declined his NG tube. -Plan was to transfer to nursing home in next 24 to 48 hours if he is able to eat at least 50% of his meals but calorie count is inaccurate and will need to ensure that patient is adequately meeting his needs; if he is not tolerating p.o. left need to reevaluate goals of care discussion and he may be hospice appropriate as NG tube will not be replaced and PEG tube is not desired. -Patient has been doing good with his listener shakes p.o. 4 times daily and Magic of 3 times daily with meals and multivitamins however calorie count has not been discontinued given lack of nursing documentation -He has been started back on IV fluids and nephrology been consulted for AKI -Palliative care is involved in goals of care discussion and after discussion family wants to continue IV antibiotics at SNF  with palliative however if kidney function continues to worsen of care will be recommending hospice house. -Patient has a very high risk of further decompensation and decline  SVT and Sinus Tachycardia -Improving.  Treated with Cardizem.   -Now on Cardizem 60 mg po q8h and Metoprolol Tartrate 50 mg po BID and IV Metoprolol 2.5 IV q6hprn for HR>120 Sustained -Currently HR acceptable ranging from from 89-107 -TSH 1.6.  Acute metabolic encephalopathy in the setting of history of stroke and underlying vascular dementia -Gradual worsening of the mental status today he is not awake enough to participate in examination and has not made very much improvement  -Repeat MRI with no evidence of acute process.  Does have history of vascular dementia.   -Followed by palliative.  -Hospice recommended, family declined and could not make decisions. 12/20, patient started eating some but was not awake enough to take anything orally today.  Dobbhoff tube discontinued. -Initially he has had some improvement in mental status, however overall remains in very debilitated state and today he is not awake enough on my examination and continues to not really interact.  His albumin was 1.0 today -Calorie count has  Now been discontinued  Hypocalcemia -Patient's calcium was 7.6 -Albumin not checked for corrected calcium -Replete with IV calcium gluconate  1 g again -We will continue to monitor and trend and repeat CMP in a.m.  Thrombocytosis -Likely reactive  -Patient's platelet count went from 455 -> 499 -> 405 and is now worsened to 531 and is trended down and improved to 343 -Continue Monitor and Trend -Repeat CBC in the AM  Microcytic Anemia -Patient's Hgb/Hct is relatively stable at 8.1/23.2 -Checked anemia panel showed iron level of 25, U IBC 108, TIBC 133, saturation of 90%, ferritin of 559, folate level of 7.9, vitamin B12 332 -Continue to Monitor for S/Sx of Bleeding; Currently no overt bleeding  noted -Repeat CBC in the AM  GERD/GI Prophylaxis -C/w Pantoprazole 40 mg po Daily   Acute Kidney Injury with Hypernatremia, worsened  -Sodium level improved with increasing free water flush.   -Renal function and sodium have both normalized but significantly elevated yesterday -Patient's sodium this morning was 147 and patient's BUNs/creatinine has gone from 11/1.00 on 23 December and is now 35/5.34 -will obtain a urinalysis, urine culture, bladder scan, urine electrolytes as well as renal ultrasound -Nephrology was consulted for further evaluation and Dr. Ronalee Belts recommends giving 500 mL bolus and starting the patient on normal saline at 125 MLS per hour that he is severely dehydrated however he cannot effectively rule out AIN from his antibiotic -Avoid nephrotoxic medications, contrast dyes, hypotension and renally dose medications -Continue to monitor and trend and repeat CMP in a.m.  Type 2 Diabetes, uncontrolled with Hyperglycemia -> Hypoglycemia -HbA1c 8.1.  -On 12/20, hypoglycemic episodes after stopping tube feeding and continuing on long-acting insulin. -Decrease dose of long-acting insulin, sliding scale insulin to sensitive scale. He is now on Sensitive Novolog SSI AC/HS -If he is not eating well, he will not need that much insulin and is now on 10 units of Lantus subcu daily -CBGs ranging from 108-272; Continue to monitor and adjust blood sugars as necessary  Dysphagia -Remained with poor mentation again today.  Initially was eating with help but his oral intake is not being properly documented.   -He does not want a PEG tube  -No reinsertion of Dobbhoff tube recommended.   -We can see next few days and continue to have palliative discussion and if he is not improving may be hospice appropriate  Pressure Injury Pressure Injury 08/07/20 Buttocks Right;Medial;Left Stage 2 -  Partial thickness loss of dermis presenting as a shallow open injury with a red, pink wound bed  without slough. (Active)  08/07/20 1914  Location: Buttocks  Location Orientation: Right;Medial;Left  Staging: Stage 2 -  Partial thickness loss of dermis presenting as a shallow open injury with a red, pink wound bed without slough.  Wound Description (Comments):   Present on Admission: Yes     Pressure Injury 11/02/20 Penis (Active)  11/02/20 0344  Location: Penis (distal meatus)  Location Orientation:   Staging:   Wound Description (Comments):   Present on Admission:    Severe Malnutrition in the Context of Chronic Illness -Estimated body mass index is 21.08 kg/m as calculated from the following:   Height as of this encounter: 6' (1.829 m).   Weight as of this encounter: 70.5 kg.  -Nutrition Problem: Severe Malnutrition -Etiology: chronic illness (Dementia) -Signs/Symptoms: severe fat depletion,severe muscle depletion,percent weight loss (15.4% weight loss in 9 months) -Percent weight loss: 15.4 % (less than 9 months) -Interventions: Tube feeding,Prostat,MVI; TF now discontinued and Panda Tube Removed -Nutritionist consulted for further evaluation and recommendations -C/w 48 hour Calorie Count but she has not been keeping track of  his meals appropriately so we will need to extend this out given the very limited no documentation abdomen completed by nursing staff.  Nutrition was stressing the importance of keeping accurate documentation and it is unclear if the patient is consuming his supplements well.  Will need to make an informed decision of whether he is able to adequately meet his nutritional needs orally we will need to have continued goals of care discussion and possible transfer to hospice if he does not -Nutritionist recommending c/w Glucerna Shake po TID, Magic Cup TIDwm, and MVI Daily; stressed importance of obtaining a calorie count -Results of Calorie Count still pending   DVT prophylaxis: Heparin 5,000 units sq q8h Code Status: DO NOT RESUSCITATE  Family  Communication: No family present at bedside  Disposition Plan: Anticipating D/C to SNF if able to tolerate po Intake adequately vs Hospice Appropriate  Status is: Inpatient  Remains inpatient appropriate because:Unsafe d/c plan, IV treatments appropriate due to intensity of illness or inability to take PO and Inpatient level of care appropriate due to severity of illness   Dispo:  Patient From:  Home  Planned Disposition:  SNF  Expected discharge date: 11/17/2020  Medically stable for discharge:  No  Consultants:   Palliative Care  Infectious Diseases  Interventional Radiology  PCCM Transfer  Orthopedic Surgery  Nephrology   Procedures:  Left hip abscess s/p abscess drain to the left thigh on 10/25/2020  Antimicrobials: Anti-infectives (From admission, onward)   Start     Dose/Rate Route Frequency Ordered Stop   11/14/20 0300  ceFAZolin (ANCEF) IVPB 1 g/50 mL premix        1 g 100 mL/hr over 30 Minutes Intravenous Every 12 hours 11/13/20 1952     11/08/20 2200  rifampin (RIFADIN) capsule 300 mg  Status:  Discontinued        300 mg Oral Every 12 hours 11/08/20 1319 11/08/20 1322   11/08/20 2200  rifampin (RIFADIN) 25 mg/mL oral suspension 300 mg        300 mg Oral Every 12 hours 11/08/20 1322     11/06/20 2200  rifampin (RIFADIN) 25 mg/mL oral suspension 300 mg  Status:  Discontinued        300 mg Per Tube Every 12 hours 11/06/20 1008 11/08/20 1319   10/26/20 1100  rifampin (RIFADIN) 300 mg in sodium chloride 0.9 % 100 mL IVPB  Status:  Discontinued        300 mg 200 mL/hr over 30 Minutes Intravenous Every 12 hours 10/26/20 1007 11/06/20 1008   10/24/20 0815  ceFAZolin (ANCEF) IVPB 2g/100 mL premix  Status:  Discontinued        2 g 200 mL/hr over 30 Minutes Intravenous Every 8 hours 10/24/20 0804 11/13/20 1952   10/23/20 2200  ceFAZolin (ANCEF) IVPB 2g/100 mL premix  Status:  Discontinued        2 g 200 mL/hr over 30 Minutes Intravenous Every 12 hours 10/23/20 1406  10/24/20 0804   10/22/20 1000  ceFAZolin (ANCEF) IVPB 1 g/50 mL premix  Status:  Discontinued        1 g 100 mL/hr over 30 Minutes Intravenous Every 12 hours 11/11/2020 1745 10/23/20 1406   11/05/2020 2200  ceFAZolin (ANCEF) IVPB 2g/100 mL premix        2 g 200 mL/hr over 30 Minutes Intravenous  Once 11/16/2020 1739 11/09/2020 2239   10/22/2020 2100  cefTRIAXone (ROCEPHIN) 2 g in sodium chloride 0.9 % 100 mL IVPB  Status:  Discontinued        2 g 200 mL/hr over 30 Minutes Intravenous Every 24 hours 10/31/2020 1117 10/22/2020 1129   10/24/2020 1730  linezolid (ZYVOX) IVPB 600 mg  Status:  Discontinued        600 mg 300 mL/hr over 60 Minutes Intravenous Every 12 hours 10/20/2020 1154 11/10/2020 1739   11/11/2020 1130  cefTRIAXone (ROCEPHIN) 2 g in sodium chloride 0.9 % 100 mL IVPB  Status:  Discontinued        2 g 200 mL/hr over 30 Minutes Intravenous Every 24 hours 10/27/2020 1129 11/06/2020 1739   11/02/2020 1000  meropenem (MERREM) 500 mg in sodium chloride 0.9 % 100 mL IVPB  Status:  Discontinued        500 mg 200 mL/hr over 30 Minutes Intravenous Every 12 hours 11/08/2020 0330 11/07/2020 1117   11/02/2020 0400  linezolid (ZYVOX) IVPB 600 mg        600 mg 300 mL/hr over 60 Minutes Intravenous  Once 10/20/2020 0309 10/19/2020 0654   11/11/2020 0215  ceFEPIme (MAXIPIME) 2 g in sodium chloride 0.9 % 100 mL IVPB        2 g 200 mL/hr over 30 Minutes Intravenous  Once 11/14/2020 0212 10/28/2020 0306   11/07/2020 0215  ampicillin-sulbactam (UNASYN) 1.5 g in sodium chloride 0.9 % 100 mL IVPB  Status:  Discontinued        1.5 g 200 mL/hr over 30 Minutes Intravenous  Once 11/11/2020 0212 11/16/2020 0214   11/06/2020 0215  vancomycin (VANCOCIN) IVPB 1000 mg/200 mL premix  Status:  Discontinued        1,000 mg 200 mL/hr over 60 Minutes Intravenous  Once 11/13/2020 [id number] 10/31/2020 0314        Subjective: Seen and examined at bedside and he again is very somnolent and lethargic and unable to provide subjective history due to his current condition.   He acknowledges being in the room but does not really verbalize or talk to me.  Nursing reports that he continues "to take foot he has been drinking his Glucerna and eating his Magic cups.  Nephrology was consulted given his severe AKI.  No other concerns or complaints at this time.  Objective: Vitals:   11/13/20 2027 11/14/20 0501 11/14/20 0836 11/14/20 1630  BP: (!) 117/52 (!) 120/54 (!) 127/54 112/63  Pulse: 100 100 97 89  Resp: 20 18  18   Temp: 98.6 F (37 C) 98 F (36.7 C)  98.9 F (37.2 C)  TempSrc: Oral Oral    SpO2: 100% 100%  100%  Weight:      Height:        Intake/Output Summary (Last 24 hours) at 11/14/2020 1649 Last data filed at 11/14/2020 1500 Gross per 24 hour  Intake 1739.16 ml  Output 280 ml  Net 1459.16 ml   Filed Weights   11/02/20 0825 11/05/20 0500 11/09/20 0451  Weight: 71.5 kg 72.8 kg 70.5 kg   Examination: Physical Exam:  Constitutional: Patient is a very thin chronically ill-appearing African-American male currently in no acute distress.  Continues to be somnolent and does not interact or follow commands.  I will provide a subjective history due to his current condition  Eyes: Lids and conjunctivae normal, sclerae anicteric  ENMT: External Ears, Nose appear normal. Grossly normal hearing.   Neck: Appears normal, supple, no cervical masses, normal ROM, no appreciable thyromegaly: No JVD Respiratory: Diminished to auscultation bilaterally, no wheezing, rales, rhonchi or crackles. Normal respiratory effort and patient  is not tachypenic. No accessory muscle use.  Unlabored breathing Cardiovascular: RRR, no murmurs / rubs / gallops. S1 and S2 auscultated.  Has no appreciable extremity edema Abdomen: Soft, non-tender, non-distended. Bowel sounds positive.  GU: Deferred.  Foley catheter is in place Musculoskeletal: Has a drain in the left hip Skin: As in pressure injuries noted.. No induration; Warm and dry.  Neurologic: CN 2-12 grossly intact with no  focal deficits. Romberg sign and cerebellar reflexes not assessed.  Psychiatric: Impaired judgment and insight.  He is not alert and oriented x 3.  Appears calm mood and appropriate affect.   Data Reviewed: I have personally reviewed following labs and imaging studies  CBC: Recent Labs  Lab 11/08/20 0136 11/09/20 0236 11/10/20 0334 11/11/20 0252 11/13/20 1622 11/14/20 0832  WBC 5.5 5.4 6.1 9.7 9.9 7.9  NEUTROABS 2.9 2.8 3.4 6.4  --  5.5  HGB 9.6* 9.0* 9.3* 9.3* 9.2* 8.1*  HCT 27.4* 26.0* 26.4* 27.8* 27.9* 23.2*  MCV 76.8* 78.8* 78.3* 80.1 81.6 78.9*  PLT 405* 531* 505* 400 320 343   Basic Metabolic Panel: Recent Labs  Lab 11/08/20 0136 11/09/20 0236 11/10/20 0334 11/13/20 1622 11/13/20 1805 11/14/20 0334 11/14/20 0832  NA 136 140 144 146*  --  147*  --   K 4.1 4.0 3.9 3.9  --  3.3*  --   CL 107 110 110 112*  --  117*  --   CO2 21* 23 24 22   --  20*  --   GLUCOSE 196* 303* 354* 251*  --  179*  --   BUN 13 11 11  34*  --  35*  --   CREATININE 0.79 0.88 1.00 4.79* 5.07* 5.34*  --   CALCIUM 7.7* 7.9* 8.1* 7.5*  --  7.6*  --   MG 2.0 2.0 1.8  --   --   --  2.2  PHOS 2.8 2.7 2.6  --   --   --  4.4   GFR: Estimated Creatinine Clearance: 11.4 mL/min (A) (by C-G formula based on SCr of 5.34 mg/dL (H)). Liver Function Tests: Recent Labs  Lab 11/09/20 0236 11/10/20 0334 11/13/20 1622 11/14/20 0832  AST 18 18 19 15   ALT 7 9 6  <5  ALKPHOS 263* 305* 228* 202*  BILITOT 0.4 0.7 0.5 0.3  PROT 7.9 8.1 7.4 7.2  ALBUMIN 1.2* 1.3* 1.1* 1.0*   No results for input(s): LIPASE, AMYLASE in the last 168 hours. No results for input(s): AMMONIA in the last 168 hours. Coagulation Profile: No results for input(s): INR, PROTIME in the last 168 hours. Cardiac Enzymes: No results for input(s): CKTOTAL, CKMB, CKMBINDEX, TROPONINI in the last 168 hours. BNP (last 3 results) No results for input(s): PROBNP in the last 8760 hours. HbA1C: No results for input(s): HGBA1C in the last 72  hours. CBG: Recent Labs  Lab 11/13/20 1729 11/13/20 2040 11/14/20 0755 11/14/20 1227 11/14/20 1626  GLUCAP 206* 187* 132* 130* 108*   Lipid Profile: No results for input(s): CHOL, HDL, LDLCALC, TRIG, CHOLHDL, LDLDIRECT in the last 72 hours. Thyroid Function Tests: No results for input(s): TSH, T4TOTAL, FREET4, T3FREE, THYROIDAB in the last 72 hours. Anemia Panel: No results for input(s): VITAMINB12, FOLATE, FERRITIN, TIBC, IRON, RETICCTPCT in the last 72 hours. Sepsis Labs: No results for input(s): PROCALCITON, LATICACIDVEN in the last 168 hours.  Recent Results (from the past 240 hour(s))  Resp Panel by RT-PCR (Flu A&B, Covid) Nasopharyngeal Swab     Status: None  Collection Time: 11/10/20  3:16 AM   Specimen: Nasopharyngeal Swab; Nasopharyngeal(NP) swabs in vial transport medium  Result Value Ref Range Status   SARS Coronavirus 2 by RT PCR NEGATIVE NEGATIVE Final    Comment: (NOTE) SARS-CoV-2 target nucleic acids are NOT DETECTED.  The SARS-CoV-2 RNA is generally detectable in upper respiratory specimens during the acute phase of infection. The lowest concentration of SARS-CoV-2 viral copies this assay can detect is 138 copies/mL. A negative result does not preclude SARS-Cov-2 infection and should not be used as the sole basis for treatment or other patient management decisions. A negative result may occur with  improper specimen collection/handling, submission of specimen other than nasopharyngeal swab, presence of viral mutation(s) within the areas targeted by this assay, and inadequate number of viral copies(<138 copies/mL). A negative result must be combined with clinical observations, patient history, and epidemiological information. The expected result is Negative.  Fact Sheet for Patients:  BloggerCourse.com  Fact Sheet for Healthcare Providers:  SeriousBroker.it  This test is no t yet approved or cleared by  the [place] FDA and  has been authorized for detection and/or diagnosis of SARS-CoV-2 by FDA under an Emergency Use Authorization (EUA). This EUA will remain  in effect (meaning this test can be used) for the duration of the COVID-19 declaration under Section 564(b)(1) of the Act, 21 U.S.C.section 360bbb-3(b)(1), unless the authorization is terminated  or revoked sooner.       Influenza A by PCR NEGATIVE NEGATIVE Final   Influenza B by PCR NEGATIVE NEGATIVE Final    Comment: (NOTE) The Xpert Xpress SARS-CoV-2/FLU/RSV plus assay is intended as an aid in the diagnosis of influenza from Nasopharyngeal swab specimens and should not be used as a sole basis for treatment. Nasal washings and aspirates are unacceptable for Xpert Xpress SARS-CoV-2/FLU/RSV testing.  Fact Sheet for Patients: BloggerCourse.com  Fact Sheet for Healthcare Providers: SeriousBroker.it  This test is not yet approved or cleared by the [place] FDA and has been authorized for detection and/or diagnosis of SARS-CoV-2 by FDA under an Emergency Use Authorization (EUA). This EUA will remain in effect (meaning this test can be used) for the duration of the COVID-19 declaration under Section 564(b)(1) of the Act, 21 U.S.C. section 360bbb-3(b)(1), unless the authorization is terminated or revoked.  Performed at [facility], [address] [place], [place] [address]      RN Pressure Injury Documentation: Pressure Injury 08/07/20 Buttocks Right;Medial;Left Stage 2 -  Partial thickness loss of dermis presenting as a shallow open injury with a red, pink wound bed without slough. (Active)  08/07/20 1914  Location: Buttocks  Location Orientation: Right;Medial;Left  Staging: Stage 2 -  Partial thickness loss of dermis presenting as a shallow open injury with a red, pink wound bed without slough.  Wound Description (Comments):   Present on Admission: Yes      Pressure Injury 11/02/20 Penis (Active)  11/02/20 0344  Location: Penis  Location Orientation:   Staging:   Wound Description (Comments):   Present on Admission:      Estimated body mass index is 21.08 kg/m as calculated from the following:   Height as of this encounter: 6' (1.829 m).   Weight as of this encounter: 70.5 kg.  Malnutrition Type:  Nutrition Problem: Severe Malnutrition Etiology: chronic illness (dementia)   Malnutrition Characteristics:  Signs/Symptoms: severe fat depletion,severe muscle depletion,percent weight loss (15.4% weight loss in 9 months) Percent weight loss: 15.4 % (less than 9 months)  Nutrition Interventions:  Interventions: Glucerna shake,MVI,Magic cup,Calorie Count    Radiology Studies: US RENAL  Result Date: 11/14/2020 CLINICAL DATA:  Acute kidney injury EXAM: RENAL / URINARY TRACT ULTRASOUND COMPLETE COMPARISON:  CT abdomen and pelvis October 21, 2020 FINDINGS: Right Kidney: Renal measurements: 10.9 x 4.4 x 5.9 cm = volume: 148.4 mL. Echogenicity and renal cortical thickness are within normal limits. No mass, perinephric fluid, or hydronephrosis visualized. No sonographically demonstrable calculus or ureterectasis. Left Kidney: Renal measurements: 9.3 x 5.6 x 4.9 cm = volume: 134.8 mL. Echogenicity and renal cortical thickness are within normal limits. No mass, perinephric fluid, or hydronephrosis visualized. No sonographically demonstrable calculus or ureterectasis. Bladder: Foley catheter within urinary bladder. Moderate urine seen in the bladder without bladder wall thickening. Other: Prostate measures 5.0 x 4.4 x 3.7 cm with a diffusely inhomogeneous appearance of the prostate. Several prostatic calculi evident. IMPRESSION: 1.  Normal appearing kidneys bilaterally. 2. Moderate urine in the bladder despite presence of Foley catheter. No lesions involving the urinary bladder evident. 3. Inhomogeneous prostate parenchyma with rather prominent  size of prostate. Advise clinical assessment and PSA evaluation given this appearance. Electronically Signed   By: Bretta Bang III M.D.   On: 11/14/2020 09:48    Scheduled Meds: . brimonidine  1 drop Both Eyes TID  . Chlorhexidine Gluconate Cloth  6 each Topical Daily  . diltiazem  60 mg Oral Q8H  . feeding supplement (GLUCERNA SHAKE)  237 mL Oral TID PC & HS  . heparin injection (subcutaneous)  5,000 Units Subcutaneous Q8H  . insulin aspart  0-5 Units Subcutaneous QHS  . insulin aspart  0-9 Units Subcutaneous TID WC  . insulin glargine  10 Units Subcutaneous QHS  . latanoprost  1 drop Both Eyes QHS  . metoprolol tartrate  50 mg Oral BID  . metoprolol tartrate  5 mg Intravenous Once  . mirtazapine  7.5 mg Oral QHS  . multivitamin with minerals  1 tablet Oral Daily  . mupirocin ointment   Topical BID  . pantoprazole sodium  40 mg Oral Daily  . rifampin  300 mg Oral Q12H  . sodium chloride flush  5 mL Intracatheter Q8H   Continuous Infusions: . sodium chloride 250 mL (11/01/20 2145)  . sodium chloride 125 mL/hr at 11/14/20 1255  .  ceFAZolin (ANCEF) IV 1 g (11/14/20 1527)  . dextrose 75 mL/hr at 11/14/20 0848    LOS: 24 days   Merlene Laughter, DO [facility] PAGER is on AMION  If 7PM-7AM, please contact night-coverage [email or web address]

## 2020-11-14 NOTE — Progress Notes (Signed)
Referring Physician(s): Marguerita Merles  Supervising Physician: Malachy Moan  Patient Status:  [facility] - In-pt  Chief Complaint: Follow up left hip abscess drain placed 11/10/20 in IR  Subjective:  Patient sleeping, arouses and mumbles but cannot understand what he's saying. No staff/family at bedside this morning.  Allergies: Tape  Medications: Prior to Admission medications   Medication Sig Start Date End Date Taking? Authorizing Provider  acetaminophen (TYLENOL) 325 MG tablet Take 650 mg by mouth every 6 (six) hours as needed for mild pain or fever. 08/14/20  Yes [provider]  Amino Acids-Protein Hydrolys (FEEDING SUPPLEMENT, PRO-STAT 64,) LIQD Take 30 mLs by mouth in the morning and at bedtime. For wound healing 08/15/20  Yes [provider]  aspirin 325 MG tablet Take 325 mg by mouth daily.   Yes [provider]  atorvastatin (LIPITOR) 40 MG tablet Take 1 tablet (40 mg total) by mouth daily. 08/14/20  Yes Rhetta Mura, MD  bisacodyl (DULCOLAX) 10 MG suppository Place 10 mg rectally as needed for moderate constipation.   Yes [provider]  brimonidine (ALPHAGAN P) 0.1 % SOLN Place 1 drop into both eyes 3 (three) times daily.   Yes [provider]  ferrous sulfate 325 (65 FE) MG tablet Take 325 mg by mouth daily.  08/31/18  Yes [name], MD  Insulin Aspart Prot & Aspart (NOVOLOG MIX 70/30 FLEXPEN [place]) Inject 14-18 Units into the skin See admin instructions. Inject 14 Units after breakfast,  Inject 18 units after lunch, inject 16 uints at dinner.   Yes [provider]  latanoprost (XALATAN) 0.005 % ophthalmic solution Place 1 drop into both eyes at bedtime.   Yes [provider]  magnesium hydroxide (MILK OF MAGNESIA) 400 MG/5ML suspension Take 30 mLs by mouth daily as needed for mild constipation.    Yes [provider]  metoprolol tartrate (LOPRESSOR) 25 MG tablet Take 1 tablet (25 mg total)  by mouth 2 (two) times daily. 08/14/20  Yes Rhetta Mura, MD  MULTIPLE VITAMIN PO Take 1 tablet by mouth daily. W/ Minerals to promote wound healing   Yes [provider]  NON FORMULARY Medpass TID d/t weight loss. 09/13/20  Yes [provider]  potassium chloride (KLOR-CON) 10 MEQ tablet TAKE 3 TABLETS BY MOUTH DAILY Patient taking differently: 30 mEq daily.  09/11/20  Yes [name], MD  Sodium Phosphates (RA SALINE ENEMA RE) Place 1 enema rectally daily as needed (for constipation).   Yes [provider]  sodium chloride 0.9 % infusion Inject 1,000 mLs into the vein daily. 284ml/ 1 hour    [provider]  sulfamethoxazole-trimethoprim (BACTRIM DS) 800-160 MG tablet Take 1 tablet by mouth 2 (two) times daily. For 10 days    [provider]  sulfamethoxazole-trimethoprim (BACTRIM) 400-80 MG tablet Take 1 tablet by mouth 2 (two) times daily. For 10 days    [provider]     Vital Signs: BP (!) 127/54   Pulse 97   Temp 98 F (36.7 C) (Oral)   Resp 18   Ht 6' (1.829 m)   Wt 155 lb 6.8 oz (70.5 kg)   SpO2 100%   BMI 21.08 kg/m   Physical Exam Vitals and nursing note reviewed.  Constitutional:      Comments: Sleeping  Cardiovascular:     Rate and Rhythm: Normal rate.  Pulmonary:     Effort: Pulmonary effort is normal.  Musculoskeletal:     Comments: (+)  left thigh drain with cloudy serous output, insertion site unremarkable. Not obviously tender to palpation.  Neurological:     Mental Status: Mental status is at baseline.     Imaging: US RENAL  Result Date: 11/14/2020 CLINICAL DATA:  Acute kidney injury EXAM: RENAL / URINARY TRACT ULTRASOUND COMPLETE COMPARISON:  CT abdomen and pelvis October 21, 2020 FINDINGS: Right Kidney: Renal measurements: 10.9 x 4.4 x 5.9 cm = volume: 148.4 mL. Echogenicity and renal cortical thickness are within normal limits. No mass, perinephric fluid, or hydronephrosis  visualized. No sonographically demonstrable calculus or ureterectasis. Left Kidney: Renal measurements: 9.3 x 5.6 x 4.9 cm = volume: 134.8 mL. Echogenicity and renal cortical thickness are within normal limits. No mass, perinephric fluid, or hydronephrosis visualized. No sonographically demonstrable calculus or ureterectasis. Bladder: Foley catheter within urinary bladder. Moderate urine seen in the bladder without bladder wall thickening. Other: Prostate measures 5.0 x 4.4 x 3.7 cm with a diffusely inhomogeneous appearance of the prostate. Several prostatic calculi evident. IMPRESSION: 1.  Normal appearing kidneys bilaterally. 2. Moderate urine in the bladder despite presence of Foley catheter. No lesions involving the urinary bladder evident. 3. Inhomogeneous prostate parenchyma with rather prominent size of prostate. Advise clinical assessment and PSA evaluation given this appearance. Electronically Signed   By: Bretta Bang III M.D.   On: 11/14/2020 09:48    Labs:  CBC: Recent Labs    11/10/20 0334 11/11/20 0252 11/13/20 1622 11/14/20 0832  WBC 6.1 9.7 9.9 7.9  HGB 9.3* 9.3* 9.2* 8.1*  HCT 26.4* 27.8* 27.9* 23.2*  PLT 505* 400 320 343    COAGS: Recent Labs    11/06/2020 0100  INR 1.4*  APTT 42*    BMP: Recent Labs    08/12/20 0540 08/13/20 0548 08/13/20 0548 08/21/20 0000 09/25/20 0000 11/06/2020 0100 11/09/20 0236 11/10/20 0334 11/13/20 1622 11/13/20 1805 11/14/20 0334  NA 146* 142   < > 138 142   < > 140 144 146*  --  147*  K 3.8 3.9  --  4.7 4.3   < > 4.0 3.9 3.9  --  3.3*  CL 108 110  --  102 100   < > 110 110 112*  --  117*  CO2 26 22  --  25* 23*   < > 23 24 22   --  20*  GLUCOSE 221* 161*  --   --   --    < > 303* 354* 251*  --  179*  BUN 25* 24*   < > 10 9   < > 11 11 34*  --  35*  CALCIUM 7.9* 7.6*  --  8.5* 9.7   < > 7.9* 8.1* 7.5*  --  7.6*  CREATININE 1.34* 1.04   < > 0.8 1.1   < > 0.88 1.00 4.79* 5.07* 5.34*  GFRNONAA 50* >60  --  83.93 62.96   < > >60  >60 12* 11* 10*  GFRAA 58* >60  --  >90 72.97  --   --   --   --   --   --    < > = values in this interval not displayed.    LIVER FUNCTION TESTS: Recent Labs    11/09/20 0236 11/10/20 0334 11/13/20 1622 11/14/20 0832  BILITOT 0.4 0.7 0.5 0.3  AST 18 18 19 15   ALT 7 9 6  <5  ALKPHOS 263* 305* 228* 202*  PROT 7.9 8.1 7.4 7.2  ALBUMIN 1.2* 1.3* 1.1* 1.0*  Assessment and Plan:  79 y/o M s/p left thigh abscess drain placement 11/10/20 in IR seen today for routine follow up.  Patient somnolent on exam, very poor PO intake per chart, palliative care following - is now DNR however family wishes to continue current abx course. Drain with continued significant output - per I/O 40 cc output in last 24 hours.   Continue current drain management - once output <10 cc/QD consider repeating imaging for possible removal. If patient is to be d/ced prior to this drain is to be flushed QD with 5 cc NS, record output QD, follow up in IR clinic 10-14 days post d/c for possible repeat imaging/drain injection.  IR will continue to follow along, please call with questions or concerns.  Electronically Signed: Villa Herb, PA-C 11/14/2020, 1:25 PM   I spent a total of 15 Minutes at the the patient's bedside AND on the patient's hospital floor or unit, greater than 50% of which was counseling/coordinating care for left thigh abscess drain follow up

## 2020-11-15 DIAGNOSIS — L02416 Cutaneous abscess of left lower limb: Secondary | ICD-10-CM | POA: Diagnosis not present

## 2020-11-15 DIAGNOSIS — R7881 Bacteremia: Secondary | ICD-10-CM | POA: Diagnosis not present

## 2020-11-15 DIAGNOSIS — L0291 Cutaneous abscess, unspecified: Secondary | ICD-10-CM | POA: Diagnosis not present

## 2020-11-15 DIAGNOSIS — A419 Sepsis, unspecified organism: Secondary | ICD-10-CM | POA: Diagnosis not present

## 2020-11-15 LAB — COMPREHENSIVE METABOLIC PANEL
ALT: <5 U/L (ref 0–44)
AST: 18 U/L (ref 15–41)
Albumin: 1.1 g/dL — ABNORMAL LOW (ref 3.5–5.0)
Alkaline Phosphatase: 194 U/L — ABNORMAL HIGH (ref 38–126)
Anion gap: 10 (ref 5–15)
BUN: 36 mg/dL — ABNORMAL HIGH (ref 8–23)
CO2: 19 mmol/L — ABNORMAL LOW (ref 22–32)
Calcium: 7.3 mg/dL — ABNORMAL LOW (ref 8.9–10.3)
Chloride: 118 mmol/L — ABNORMAL HIGH (ref 98–111)
Creatinine, Ser: 5.67 mg/dL — ABNORMAL HIGH (ref 0.61–1.24)
GFR, Estimated: 10 mL/min — ABNORMAL LOW (ref >60)
Glucose, Bld: 129 mg/dL — ABNORMAL HIGH (ref 70–99)
Potassium: 4.2 mmol/L (ref 3.5–5.1)
Sodium: 147 mmol/L — ABNORMAL HIGH (ref 135–145)
Total Bilirubin: 0.4 mg/dL (ref 0.3–1.2)
Total Protein: 7.2 g/dL (ref 6.5–8.1)

## 2020-11-15 LAB — CBC WITH DIFFERENTIAL/PLATELET
Abs Immature Granulocytes: 0.04 10*3/uL (ref 0.00–0.07)
Basophils Absolute: 0.1 10*3/uL (ref 0.0–0.1)
Basophils Relative: 1 %
Eosinophils Absolute: 0.5 10*3/uL (ref 0.0–0.5)
Eosinophils Relative: 8 %
HCT: 27.7 % — ABNORMAL LOW (ref 39.0–52.0)
Hemoglobin: 9.1 g/dL — ABNORMAL LOW (ref 13.0–17.0)
Immature Granulocytes: 1 %
Lymphocytes Relative: 15 %
Lymphs Abs: 1.1 10*3/uL (ref 0.7–4.0)
MCH: 26.7 pg (ref 26.0–34.0)
MCHC: 32.9 g/dL (ref 30.0–36.0)
MCV: 81.2 fL (ref 80.0–100.0)
Monocytes Absolute: 0.4 10*3/uL (ref 0.1–1.0)
Monocytes Relative: 6 %
Neutro Abs: 4.9 10*3/uL (ref 1.7–7.7)
Neutrophils Relative %: 69 %
Platelets: 294 10*3/uL (ref 150–400)
RBC: 3.41 MIL/uL — ABNORMAL LOW (ref 4.22–5.81)
RDW: 21.4 % — ABNORMAL HIGH (ref 11.5–15.5)
WBC: 7 10*3/uL (ref 4.0–10.5)
nRBC: 0 % (ref 0.0–0.2)

## 2020-11-15 LAB — GLUCOSE, CAPILLARY
Glucose-Capillary: 117 mg/dL — ABNORMAL HIGH (ref 70–99)
Glucose-Capillary: 150 mg/dL — ABNORMAL HIGH (ref 70–99)
Glucose-Capillary: 154 mg/dL — ABNORMAL HIGH (ref 70–99)
Glucose-Capillary: 290 mg/dL — ABNORMAL HIGH (ref 70–99)
Glucose-Capillary: 54 mg/dL — ABNORMAL LOW (ref 70–99)

## 2020-11-15 LAB — PHOSPHORUS: Phosphorus: 4.2 mg/dL (ref 2.5–4.6)

## 2020-11-15 LAB — URINE CULTURE: Culture: >=100000 — AB

## 2020-11-15 LAB — MAGNESIUM: Magnesium: 2 mg/dL (ref 1.7–2.4)

## 2020-11-15 MED ORDER — DEXTROSE 50 % IV SOLN
INTRAVENOUS | Status: AC
  Filled 2020-11-15: qty 50

## 2020-11-15 MED ORDER — SODIUM CHLORIDE 0.45 % IV SOLN: INTRAVENOUS | Status: DC

## 2020-11-15 MED ORDER — ALBUMIN HUMAN 25 % IV SOLN
50.0000 g | Freq: Four times a day (QID) | INTRAVENOUS | Status: AC
  Administered 2020-11-15 (×3): 50 g via INTRAVENOUS
  Filled 2020-11-15 (×3): qty 200

## 2020-11-15 MED ORDER — DEXTROSE 50 % IV SOLN
25.0000 g | INTRAVENOUS | Status: AC
  Administered 2020-11-15: 23:00:00 25 g via INTRAVENOUS

## 2020-11-15 NOTE — Progress Notes (Signed)
PROGRESS NOTE    Marcus LankSamuel J Mathwig Jr.  ZOX:[id number] DOB: 02/25/1941 DOA: 10/31/2020 PCP: Eustaquio BoydenGutierrez, Javier, MD   Brief Narrative:  Patient is a 79 year old African-American male with a past medical history significant for but not limited to vascular dementia, previous CVA with residual left-sided hemiplegia, essential hypertension, type 2 diabetes mellitus, dysphagia and hyperlipidemia as well as chronic urine retention with indwelling Foley catheter secondary to BPH and recent left femoral neck fracture status post left hemiarthroplasty on 08/08/2020 who presented from his nursing facility with altered mental status, agitation and acute kidney injury with electrolyte abnormalities with hyponatremia.  He was in septic shock and found to have an abscess around his arthroplasty hardware and an MSSA bacteremia and was deemed not a surgical candidate.  He was empirically treated with IV antibiotics for his MSSA infection and ended up having a left hip drain. [facility] Overall his prognosis and clinical status remained poor and my colleagues have had multiple conversations with family about palliative care or hospice and family not ready yet.  On 11/08/2020 is pain to tube was removed and he was eating about 50% of his meals but this is not been adequately documented.  We will ask for a calorie count to see if the patient actually nutrition orally.  If he does not he may be hospice appropriate.  Palliative care following appreciate continue goals of care discussion.   Interim History Intermittently he has not been awake enough to participate in Examination and so ? How much he is really taking in orally. IR evaluated and patient's left hip abscess drain is intact and there can continue to follow and recommending a repeat CT scan when his output is minimal and less than 10 cc daily.   On 11/09/2020 patient had better intake with his Magic cup and will start obstruction.   Calorie count was extended for another day.  Lantus was added for the patient on 11/10/2020.  On day 3 of his calorie count patient continued to do well with Magic cup with excellent consumption even though meal consumption was poor.  Patient blood sugars were in 300 so they continue with same as opposed to more nutritionally dense Ensure supplements.  Nutritionist recommended trialing Nucynta a fourth time to help patient meet his calorie count and protein needs.  Calorie count was continued throughout the weekend and per facility he needed to eat at least 90% of his meals prior to discharge but unfortunately no meal documentation was obtained over the weekend and nutritionist was unable to provide any additional information regarding patient's p.o consumption.Marland Kitchen.  He did not want another PEG tube Dobbhoff.  In the interim patient intermittent acute kidney injury and worsening renal status.  He was trialed on mirtazapine for appetite stimulant.  Family wants to continue IV antibiotic course for now.  On 12/25 2020 when he had no significant p.o. intake and was not even alert enough to take his medication.  He was started on <5 MLS per hour since his poor p.o. intake.  After further goals of care discussion with palliative care medicine patient was changed to DO NOT RESUSCITATE.  Palliative continues to meet with the patient and the family about goals of care.  Nephrology was consulted given his AKI and they feel it is in the setting of severe dehydration with concomitant hemodynamic changes/hypotension.  Dr. Ronalee BeltsBhandari is unable to rule out and related antibiotic use.  We have repeated a urinalysis, order bladder  scan and renal ultrasound which has ruled out obstruction.  Patient was given a 500 mL bolus and started on normal saline at 125 MLS per hour.  He is not a candidate for dialysis treatment per nephrology recommendations  11/15/2020: Patient has some facial twitching likely in the setting of his uremia.   Continues to have severe dehydration and has been predisposed to his AKI because of his prior AKI.  They are recommending continuing IV fluid hydration given that they think he still dry and because he has no oral intake.  They also ordered him some albumin IV and recommending strict I's and O's and avoid hypotensive episodes.  His functional status is very poor and he is not a dialysis candidate.  They are recommending continuing half-normal saline at 125 mils per hour and patient is also getting D5W at 75 MLS per hour.  Assessment & Plan:   Principal Problem:   MSSA bacteremia Active Problems:   Protein-calorie malnutrition, severe (HCC)   Encounter for nasogastric (NG) tube placement   Severe sepsis (HCC)   Abscess of left hip   Hypernatremia   Abscess   Acute kidney injury (nontraumatic) (HCC)   Dehydration   Elevated liver function tests   Prosthetic hip infection (HCC)   Hyperkalemia   MSSA bacteremia/sepsis secondary to MSSA left hip abscess with septic shock: Prosthetic hip infection. poA Catheter Associated UTI -Treated with vasopressors and weaned off. -Blood cultures 12/4, MSSA bacteremia -Hip abscess 12/4, MSSA -Urine culture 12/4, MSSA -Repeat blood cultures negative.  Echocardiogram without any vegetation.   -Not a good candidate for operative removal/spacer or multistage surgery. -IR guided abscess drain, continues to drain.  Overall with poor clinical improvement. -Currently remains on cefazolin and rifampicin.  Total 6 weeks of antibiotics planned. -Antibiotic end date 12/03/2020. -IR evaluated and recommending repeating CT scanning when his output from his drain is minimal less than 10 cc/day -Oral intake has been extremely poor throughout the weekend and has declined his NG tube. -Plan was to transfer to nursing home in next 24 to 48 hours if he is able to eat at least 50% of his meals but calorie count is inaccurate and will need to ensure that patient is  adequately meeting his needs; if he is not tolerating p.o. left need to reevaluate goals of care discussion and he may be hospice appropriate as NG tube will not be replaced and PEG tube is not desired. -Patient has been doing good with his listener shakes p.o. 4 times daily and Magic of 3 times daily with meals and multivitamins however calorie count has not been discontinued given lack of nursing documentation -He has been started back on IV fluids and nephrology been consulted for AKI -Palliative care is involved in goals of care discussion and after discussion family wants to continue IV antibiotics at SNF with palliative however if kidney function continues to worsen of care will be recommending hospice house. -Patient has a very high risk of further decompensation and decline  SVT and Sinus Tachycardia -Improving.  Treated with Cardizem.   -Now on Cardizem 60 mg po q8h and Metoprolol Tartrate 50 mg po BID and IV Metoprolol 2.5 IV q6hprn for HR>120 Sustained -Currently HR acceptable ranging from from 89-107 -TSH 1.6.  Acute metabolic encephalopathy in the setting of history of stroke and underlying vascular dementia -Gradual worsening of the mental status today he is not awake enough to participate in examination and has not made very much improvement  -Repeat MRI  with no evidence of acute process.  Does have history of vascular dementia.   -Followed by palliative.  -Hospice recommended, family declined and could not make decisions. 12/20, patient started eating some but was not awake enough to take anything orally today.  Dobbhoff tube discontinued. -Initially he has had some improvement in mental status, however overall remains in very debilitated state; number to interact with me today but I cannot understand what he is saying he is very confused.  His albumin was 1.1 today -Calorie count has  Now been discontinued  Hypocalcemia -Patient's calcium was 7.3 -Albumin not checked for  corrected calcium -Replete with IV calcium gluconate 1 g yesterday -Corrected Calcium is 9.6 -We will continue to monitor and trend and repeat CMP in a.m.  Thrombocytosis -Likely reactive  -Patient's platelet count went from 455 -> 499 -> 405 and is now worsened to 531 and is trended down and improved to 343 yesterday and today is 294 -Continue Monitor and Trend -Repeat CBC in the AM  Hypernatremia -In the setting of dehydration from poor oral intake -Patient's sodium is now 147 -Continue with fluid hydration as well per nephrology -Continue to monitor and trend and repeat CMP in a.m.  Microcytic Anemia -Patient's Hgb/Hct is relatively stable at 8.1/23.2 yesterday and this morning is now 9.1/27.7 -Checked anemia panel showed iron level of 25, U IBC 108, TIBC 133, saturation of 90%, ferritin of 559, folate level of 7.9, vitamin B12 332 -Continue to Monitor for S/Sx of Bleeding; Currently no overt bleeding noted -Repeat CBC in the AM  GERD/GI Prophylaxis -C/w Pantoprazole 40 mg po Daily   Acute Kidney Injury with Hypernatremia, worsened  Metabolic acidosis -Sodium level improved with increasing free water flush.   -Renal function and sodium have both normalized but significantly elevated yesterday -Patient's sodium this morning was 147 and patient's BUNs/creatinine has gone from 11/1.00 on 23 December and is now worsening and 36/5.67 -will obtain a urinalysis, urine culture, bladder scan, urine electrolytes as well as renal ultrasound -Nephrology was consulted for further evaluation and Dr. Ronalee Belts adjusting his fluids and he is now on half-normal saline at 125 mL/h and D5W at 75 mils per hour -patient has a CO2 of 19, anion gap of 10, chloride level 118 -Avoid nephrotoxic medications, contrast dyes, hypotension and renally dose medications -Continue to monitor and trend and repeat CMP in a.m.  Type 2 Diabetes, uncontrolled with Hyperglycemia -> Hypoglycemia -HbA1c 8.1.  -On  12/20, hypoglycemic episodes after stopping tube feeding and continuing on long-acting insulin. -Decrease dose of long-acting insulin, sliding scale insulin to sensitive scale. He is now on Sensitive Novolog SSI AC/HS -If he is not eating well, he will not need that much insulin and is now on 10 units of Lantus subcu daily -CBGs ranging from 105-154; Continue to monitor and adjust blood sugars as necessary  Dysphagia -Remained with poor mentation again today.  Initially was eating with help but his oral intake is not being properly documented.   -He does not want a PEG tube  -No reinsertion of Dobbhoff tube recommended.   -We can see next few days and continue to have palliative discussion and if he is not improving may be hospice appropriate  Pressure Injury Pressure Injury 08/07/20 Buttocks Right;Medial;Left Stage 2 -  Partial thickness loss of dermis presenting as a shallow open injury with a red, pink wound bed without slough. (Active)  08/07/20 1914  Location: Buttocks  Location Orientation: Right;Medial;Left  Staging: Stage 2 -  Partial thickness loss of dermis presenting as a shallow open injury with a red, pink wound bed without slough.  Wound Description (Comments):   Present on Admission: Yes     Pressure Injury 11/02/20 Penis (Active)  11/02/20 0344  Location: Penis (distal meatus)  Location Orientation:   Staging:   Wound Description (Comments):   Present on Admission:    Severe Malnutrition in the Context of Chronic Illness -Estimated body mass index is 21.08 kg/m as calculated from the following:   Height as of this encounter: 6' (1.829 m).   Weight as of this encounter: 70.5 kg.  -Nutrition Problem: Severe Malnutrition -Etiology: chronic illness (Dementia) -Signs/Symptoms: severe fat depletion,severe muscle depletion,percent weight loss (15.4% weight loss in 9 months) -Percent weight loss: 15.4 % (less than 9 months) -Interventions: Tube feeding,Prostat,MVI; TF now  discontinued and Panda Tube Removed -Nutritionist consulted for further evaluation and recommendations -C/w 48 hour Calorie Count but she has not been keeping track of his meals appropriately so we will need to extend this out given the very limited no documentation abdomen completed by nursing staff.  Nutrition was stressing the importance of keeping accurate documentation and it is unclear if the patient is consuming his supplements well.  Will need to make an informed decision of whether he is able to adequately meet his nutritional needs orally we will need to have continued goals of care discussion and possible transfer to hospice if he does not -Nutritionist recommending c/w Glucerna Shake po TID, Magic Cup TIDwm, and MVI Daily; stressed importance of obtaining a calorie count -Patient is not eating very much and documentation of the calorie count has now stopped. -We will need to continue goals of care discussion  DVT prophylaxis: Heparin 5,000 units sq q8h Code Status: DO NOT RESUSCITATE  Family Communication: No family present at bedside  Disposition Plan: Anticipating D/C to SNF if able to tolerate po Intake adequately vs Hospice Appropriate  Status is: Inpatient  Remains inpatient appropriate because:Unsafe d/c plan, IV treatments appropriate due to intensity of illness or inability to take PO and Inpatient level of care appropriate due to severity of illness   Dispo:  Patient From:  Home  Planned Disposition:  SNF  Expected discharge date: 11/17/2020  Medically stable for discharge:  No  Consultants:   Palliative Care  Infectious Diseases  Interventional Radiology  PCCM Transfer  Orthopedic Surgery  Nephrology   Procedures:  Left hip abscess s/p abscess drain to the left thigh on 10/27/2020  Antimicrobials: Anti-infectives (From admission, onward)   Start     Dose/Rate Route Frequency Ordered Stop   11/14/20 0300  ceFAZolin (ANCEF) IVPB 1 g/50 mL premix        1  g 100 mL/hr over 30 Minutes Intravenous Every 12 hours 11/13/20 1952     11/08/20 2200  rifampin (RIFADIN) capsule 300 mg  Status:  Discontinued        300 mg Oral Every 12 hours 11/08/20 1319 11/08/20 1322   11/08/20 2200  rifampin (RIFADIN) 25 mg/mL oral suspension 300 mg        300 mg Oral Every 12 hours 11/08/20 1322     11/06/20 2200  rifampin (RIFADIN) 25 mg/mL oral suspension 300 mg  Status:  Discontinued        300 mg Per Tube Every 12 hours 11/06/20 1008 11/08/20 1319   10/26/20 1100  rifampin (RIFADIN) 300 mg in sodium chloride 0.9 % 100 mL IVPB  Status:  Discontinued  300 mg 200 mL/hr over 30 Minutes Intravenous Every 12 hours 10/26/20 1007 11/06/20 1008   10/24/20 0815  ceFAZolin (ANCEF) IVPB 2g/100 mL premix  Status:  Discontinued        2 g 200 mL/hr over 30 Minutes Intravenous Every 8 hours 10/24/20 0804 11/13/20 1952   10/23/20 2200  ceFAZolin (ANCEF) IVPB 2g/100 mL premix  Status:  Discontinued        2 g 200 mL/hr over 30 Minutes Intravenous Every 12 hours 10/23/20 1406 10/24/20 0804   10/22/20 1000  ceFAZolin (ANCEF) IVPB 1 g/50 mL premix  Status:  Discontinued        1 g 100 mL/hr over 30 Minutes Intravenous Every 12 hours 11/01/2020 1745 10/23/20 1406   11/06/2020 2200  ceFAZolin (ANCEF) IVPB 2g/100 mL premix        2 g 200 mL/hr over 30 Minutes Intravenous  Once 11/13/2020 1739 11/07/2020 2239   10/20/2020 2100  cefTRIAXone (ROCEPHIN) 2 g in sodium chloride 0.9 % 100 mL IVPB  Status:  Discontinued        2 g 200 mL/hr over 30 Minutes Intravenous Every 24 hours 10/28/2020 1117 11/13/2020 1129   10/26/2020 1730  linezolid (ZYVOX) IVPB 600 mg  Status:  Discontinued        600 mg 300 mL/hr over 60 Minutes Intravenous Every 12 hours 10/23/2020 1154 11/12/2020 1739   10/22/2020 1130  cefTRIAXone (ROCEPHIN) 2 g in sodium chloride 0.9 % 100 mL IVPB  Status:  Discontinued        2 g 200 mL/hr over 30 Minutes Intravenous Every 24 hours 10/19/2020 1129 10/29/2020 1739   10/29/2020 1000   meropenem (MERREM) 500 mg in sodium chloride 0.9 % 100 mL IVPB  Status:  Discontinued        500 mg 200 mL/hr over 30 Minutes Intravenous Every 12 hours 11/09/2020 0330 11/01/2020 1117   11/17/2020 0400  linezolid (ZYVOX) IVPB 600 mg        600 mg 300 mL/hr over 60 Minutes Intravenous  Once 11/07/2020 0309 10/26/2020 0654   11/05/2020 0215  ceFEPIme (MAXIPIME) 2 g in sodium chloride 0.9 % 100 mL IVPB        2 g 200 mL/hr over 30 Minutes Intravenous  Once 11/09/2020 0212 10/30/2020 0306   10/31/2020 0215  ampicillin-sulbactam (UNASYN) 1.5 g in sodium chloride 0.9 % 100 mL IVPB  Status:  Discontinued        1.5 g 200 mL/hr over 30 Minutes Intravenous  Once 11/06/2020 0212 10/23/2020 0214   10/29/2020 0215  vancomycin (VANCOCIN) IVPB 1000 mg/200 mL premix  Status:  Discontinued        1,000 mg 200 mL/hr over 60 Minutes Intravenous  Once 10/30/2020 1610 10/29/2020 0314        Subjective: Seen and examined at bedside and he is more awake today and more interactive and he is speaking but unable to understand what he saying.  He continues to have some twitching in his mouth likely from the setting of his uremia.  No nausea or vomiting.  Nursing has tried to feed him but he has been refusing oral intake intermittently.  No other concerns or complaints at this time we will continue palliative care goals of care discussion and continue with antibiotics for now.  Objective: Vitals:   11/14/20 1931 11/14/20 2139 11/15/20 0402 11/15/20 0803  BP: (!) 154/71 (!) 162/65 (!) 174/72 (!) 124/57  Pulse: 92 92 92 91  Resp: 18 16  18 18  Temp: 98 F (36.7 C) 97.8 F (36.6 C) 97.6 F (36.4 C) (!) 97.3 F (36.3 C)  TempSrc:  Oral Oral Oral  SpO2: 98% 100%  100%  Weight:      Height:        Intake/Output Summary (Last 24 hours) at 11/15/2020 1723 Last data filed at 11/15/2020 1500 Gross per 24 hour  Intake 6180.18 ml  Output 1475 ml  Net 4705.18 ml   Filed Weights   11/02/20 0825 11/05/20 0500 11/09/20 0451  Weight: 71.5  kg 72.8 kg 70.5 kg   Examination: Physical Exam:  Constitutional: Patient is a very thin chronically ill-appearing African-American male currently in no acute distress he is more awake today and does communicate but is incomprehensible and difficult to understand him. Eyes: Lids and conjunctivae normal, sclerae anicteric  ENMT: External Ears, Nose appear normal. Grossly normal hearing.  Does have some mouth twitching Neck: Appears normal, supple, no cervical masses, normal ROM, no appreciable thyromegaly; no JVD Respiratory: Diminished to auscultation bilaterally with slightly coarse breath sounds, no wheezing, rales, rhonchi or crackles. Normal respiratory effort and patient is not tachypenic. No accessory muscle use.  Unlabored breathing Cardiovascular: RRR, no murmurs / rubs / gallops. S1 and S2 auscultated. No extremity edema. Abdomen: Soft, non-tender, non-distended. Bowel sounds positive.  GU: Foley catheter is in place Musculoskeletal: No clubbing / cyanosis of digits/nails.  Has a left drain in his hip Skin: No rashes, lesions, ulcers on limited skin evaluation. No induration; Warm and dry.  Neurologic: CN 2-12 grossly intact with no focal deficits. Romberg sign and cerebellar reflexes not assessed.  Psychiatric: Impaired judgment and insight.  He is awake and alert but not fully oriented. Normal mood and appropriate affect.   Data Reviewed: I have personally reviewed following labs and imaging studies  CBC: Recent Labs  Lab 11/09/20 0236 11/10/20 0334 11/11/20 0252 11/13/20 1622 11/14/20 0832 11/15/20 0238  WBC 5.4 6.1 9.7 9.9 7.9 7.0  NEUTROABS 2.8 3.4 6.4  --  5.5 4.9  HGB 9.0* 9.3* 9.3* 9.2* 8.1* 9.1*  HCT 26.0* 26.4* 27.8* 27.9* 23.2* 27.7*  MCV 78.8* 78.3* 80.1 81.6 78.9* 81.2  PLT 531* 505* 400 320 343 294   Basic Metabolic Panel: Recent Labs  Lab 11/09/20 0236 11/10/20 0334 11/13/20 1622 11/13/20 1805 11/14/20 0334 11/14/20 0832 11/15/20 0238  NA 140 144  146*  --  147*  --  147*  K 4.0 3.9 3.9  --  3.3*  --  4.2  CL 110 110 112*  --  117*  --  118*  CO2 23 24 22   --  20*  --  19*  GLUCOSE 303* 354* 251*  --  179*  --  129*  BUN 11 11 34*  --  35*  --  36*  CREATININE 0.88 1.00 4.79* 5.07* 5.34*  --  5.67*  CALCIUM 7.9* 8.1* 7.5*  --  7.6*  --  7.3*  MG 2.0 1.8  --   --   --  2.2 2.0  PHOS 2.7 2.6  --   --   --  4.4 4.2   GFR: Estimated Creatinine Clearance: 10.7 mL/min (A) (by C-G formula based on SCr of 5.67 mg/dL (H)). Liver Function Tests: Recent Labs  Lab 11/09/20 0236 11/10/20 0334 11/13/20 1622 11/14/20 0832 11/15/20 0238  AST 18 18 19 15 18   ALT 7 9 6  <5 <5  ALKPHOS 263* 305* 228* 202* 194*  BILITOT 0.4 0.7 0.5 0.3  0.4  PROT 7.9 8.1 7.4 7.2 7.2  ALBUMIN 1.2* 1.3* 1.1* 1.0* 1.1*   No results for input(s): LIPASE, AMYLASE in the last 168 hours. No results for input(s): AMMONIA in the last 168 hours. Coagulation Profile: No results for input(s): INR, PROTIME in the last 168 hours. Cardiac Enzymes: No results for input(s): CKTOTAL, CKMB, CKMBINDEX, TROPONINI in the last 168 hours. BNP (last 3 results) No results for input(s): PROBNP in the last 8760 hours. HbA1C: No results for input(s): HGBA1C in the last 72 hours. CBG: Recent Labs  Lab 11/14/20 1227 11/14/20 1626 11/14/20 2214 11/15/20 0631 11/15/20 1127  GLUCAP 130* 108* 105* 150* 154*   Lipid Profile: No results for input(s): CHOL, HDL, LDLCALC, TRIG, CHOLHDL, LDLDIRECT in the last 72 hours. Thyroid Function Tests: No results for input(s): TSH, T4TOTAL, FREET4, T3FREE, THYROIDAB in the last 72 hours. Anemia Panel: No results for input(s): VITAMINB12, FOLATE, FERRITIN, TIBC, IRON, RETICCTPCT in the last 72 hours. Sepsis Labs: No results for input(s): PROCALCITON, LATICACIDVEN in the last 168 hours.  Recent Results (from the past 240 hour(s))  Resp Panel by RT-PCR (Flu A&B, Covid) Nasopharyngeal Swab     Status: None   Collection Time: 11/10/20  3:16 AM    Specimen: Nasopharyngeal Swab; Nasopharyngeal(NP) swabs in vial transport medium  Result Value Ref Range Status   SARS Coronavirus 2 by RT PCR NEGATIVE NEGATIVE Final    Comment: (NOTE) SARS-CoV-2 target nucleic acids are NOT DETECTED.  The SARS-CoV-2 RNA is generally detectable in upper respiratory specimens during the acute phase of infection. The lowest concentration of SARS-CoV-2 viral copies this assay can detect is 138 copies/mL. A negative result does not preclude SARS-Cov-2 infection and should not be used as the sole basis for treatment or other patient management decisions. A negative result may occur with  improper specimen collection/handling, submission of specimen other than nasopharyngeal swab, presence of viral mutation(s) within the areas targeted by this assay, and inadequate number of viral copies(<138 copies/mL). A negative result must be combined with clinical observations, patient history, and epidemiological information. The expected result is Negative.  Fact Sheet for Patients:  BloggerCourse.com  Fact Sheet for Healthcare Providers:  SeriousBroker.it  This test is no t yet approved or cleared by the [place] FDA and  has been authorized for detection and/or diagnosis of SARS-CoV-2 by FDA under an Emergency Use Authorization (EUA). This EUA will remain  in effect (meaning this test can be used) for the duration of the COVID-19 declaration under Section 564(b)(1) of the Act, 21 U.S.C.section 360bbb-3(b)(1), unless the authorization is terminated  or revoked sooner.       Influenza A by PCR NEGATIVE NEGATIVE Final   Influenza B by PCR NEGATIVE NEGATIVE Final    Comment: (NOTE) The Xpert Xpress SARS-CoV-2/FLU/RSV plus assay is intended as an aid in the diagnosis of influenza from Nasopharyngeal swab specimens and should not be used as a sole basis for treatment. Nasal washings and aspirates are  unacceptable for Xpert Xpress SARS-CoV-2/FLU/RSV testing.  Fact Sheet for Patients: BloggerCourse.com  Fact Sheet for Healthcare Providers: SeriousBroker.it  This test is not yet approved or cleared by the [place] FDA and has been authorized for detection and/or diagnosis of SARS-CoV-2 by FDA under an Emergency Use Authorization (EUA). This EUA will remain in effect (meaning this test can be used) for the duration of the COVID-19 declaration under Section 564(b)(1) of the Act, 21 U.S.C. section 360bbb-3(b)(1), unless the authorization is terminated or revoked.  Performed at [facility], [address], [place], [place] [address]   Culture, Urine     Status: Abnormal   Collection Time: 11/14/20 10:37 AM   Specimen: Urine, Random  Result Value Ref Range Status   Specimen Description URINE, RANDOM  Final   Special Requests   Final    NONE Performed at [facility] [facility], [address] [place], [place] [address]    Culture >=100,000 COLONIES/mL YEAST (A)  Final   Report Status 11/15/2020 FINAL  Final     RN Pressure Injury Documentation: Pressure Injury 08/07/20 Buttocks Right;Medial;Left Stage 2 -  Partial thickness loss of dermis presenting as a shallow open injury with a red, pink wound bed without slough. (Active)  08/07/20 1914  Location: Buttocks  Location Orientation: Right;Medial;Left  Staging: Stage 2 -  Partial thickness loss of dermis presenting as a shallow open injury with a red, pink wound bed without slough.  Wound Description (Comments):   Present on Admission: Yes     Pressure Injury 11/02/20 Penis (Active)  11/02/20 0344  Location: Penis  Location Orientation:   Staging:   Wound Description (Comments):   Present on Admission:      Estimated body mass index is 21.08 kg/m as calculated from the following:   Height as of this encounter: 6' (1.829 m).   Weight as of this encounter: 70.5  kg.  Malnutrition Type:  Nutrition Problem: Severe Malnutrition Etiology: chronic illness (dementia)   Malnutrition Characteristics:  Signs/Symptoms: severe fat depletion,severe muscle depletion,percent weight loss (15.4% weight loss in 9 months) Percent weight loss: 15.4 % (less than 9 months)   Nutrition Interventions:  Interventions: Glucerna shake,MVI,Magic cup,Calorie Count    Radiology Studies: US RENAL  Result Date: 11/14/2020 CLINICAL DATA:  Acute kidney injury EXAM: RENAL / URINARY TRACT ULTRASOUND COMPLETE COMPARISON:  CT abdomen and pelvis Nov 08, 2020 FINDINGS: Right Kidney: Renal measurements: 10.9 x 4.4 x 5.9 cm = volume: 148.4 mL. Echogenicity and renal cortical thickness are within normal limits. No mass, perinephric fluid, or hydronephrosis visualized. No sonographically demonstrable calculus or ureterectasis. Left Kidney: Renal measurements: 9.3 x 5.6 x 4.9 cm = volume: 134.8 mL. Echogenicity and renal cortical thickness are within normal limits. No mass, perinephric fluid, or hydronephrosis visualized. No sonographically demonstrable calculus or ureterectasis. Bladder: Foley catheter within urinary bladder. Moderate urine seen in the bladder without bladder wall thickening. Other: Prostate measures 5.0 x 4.4 x 3.7 cm with a diffusely inhomogeneous appearance of the prostate. Several prostatic calculi evident. IMPRESSION: 1.  Normal appearing kidneys bilaterally. 2. Moderate urine in the bladder despite presence of Foley catheter. No lesions involving the urinary bladder evident. 3. Inhomogeneous prostate parenchyma with rather prominent size of prostate. Advise clinical assessment and PSA evaluation given this appearance. Electronically Signed   By: Bretta Bang III M.D.   On: 11/14/2020 09:48    Scheduled Meds:  brimonidine  1 drop Both Eyes TID   Chlorhexidine Gluconate Cloth  6 each Topical Daily   diltiazem  60 mg Oral Q8H   feeding supplement  (GLUCERNA SHAKE)  237 mL Oral TID PC & HS   heparin injection (subcutaneous)  5,000 Units Subcutaneous Q8H   insulin aspart  0-5 Units Subcutaneous QHS   insulin aspart  0-9 Units Subcutaneous TID WC   insulin glargine  10 Units Subcutaneous QHS   latanoprost  1 drop Both Eyes QHS   metoprolol tartrate  50 mg Oral BID   metoprolol tartrate  5 mg Intravenous Once   mirtazapine  7.5 mg Oral QHS   multivitamin with minerals  1 tablet Oral Daily   mupirocin ointment   Topical BID   pantoprazole sodium  40 mg Oral Daily   rifampin  300 mg Oral Q12H   sodium chloride flush  5 mL Intracatheter Q8H   Continuous Infusions:  sodium chloride 125 mL/hr at 11/15/20 1234   sodium chloride 250 mL (11/01/20 2145)   albumin human 50 g (11/15/20 1146)    ceFAZolin (ANCEF) IV 1 g (11/15/20 1437)   dextrose 75 mL/hr at 11/15/20 1140    LOS: 25 days   Merlene Laughter, DO [facility] PAGER is on AMION  If 7PM-7AM, please contact night-coverage [email or web address]

## 2020-11-15 NOTE — Progress Notes (Signed)
Received a call from son, and updated regarding pt's current status. All concerns and questions were fully answered.

## 2020-11-15 NOTE — Progress Notes (Addendum)
[facility] NEPHROLOGY PROGRESS NOTE  Assessment/ Plan: Pt is a 78 y.o. yo male  with past medical history of stroke with residual left-sided hemiplegia, vascular dementia, hypertension, DM, dysphagia, HLD, chronic urinary retention on indwelling Foley catheter due to BPH, recent left femoral neck fracture required hemiarthroplasty in 07/2020 admitted on 12/4 from SNF for the concern of UTI, sepsis seen as a consultation for the evaluation of AKI.  #Acute kidney injury, nonoliguric: Likely due to severe dehydration concomitant with hemodynamic change/hypotension.  He is predisposed to AKI because of previous AKI, sepsis/MSSA bacteremia and chronic bladder outlet obstruction.  Unable to rule out AIN related to antibiotics use. Urinalysis with bacteriuria, pyuria. The kidney ultrasound ruled out obstruction. Treating with IV fluid with increased urine output 1.4 L however creatinine level mildly elevated today.  He still looks dry and has no oral intake.  Plan to continue IV fluid as ordered.  Order IV albumin. Strict ins and out, avoid hypotensive episode or nephrotoxins. Given severe dementia, very poor functional status and multiple comorbidities, he is not a candidate for dialysis treatment.  Noted he is DNR and he was seen by palliative care team.  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  Blood culture growing MSSA and currently on cefazolin and rifampicin.  Echo without any vegetation.  For now continue antibiotics.  #Severe vascular dementia, stroke and may have some acute metabolic encephalopathy: MRI of the brain with chronic infarction and cerebral atrophy.  Continue supportive and palliative care.  #Severe protein calorie malnutrition, chronic dysphagia: Very poor oral intake per nursing staff.  Per report he declined feeding tube placement.  Starting IV fluid for  hydration.  #Hyponatremia probably due to dehydration.    Improved with IV fluid.  Now sodium level went up therefore changing to half NS.  #Hypokalemia: Potassium repleted.  Normal potassium level today. Encourage oral intake.   Subjective: Seen and examined at bedside.  Remains confused and lying on bed.  Urine output around 1.4 L.  Receiving IV fluid.  No oral intake. Objective Vital signs in last 24 hours: Vitals:   11/14/20 1931 11/14/20 2139 11/15/20 0402 11/15/20 0803  BP: (!) 154/71 (!) 162/65 (!) 174/72 (!) 124/57  Pulse: 92 92 92 91  Resp: 18 16 18 18   Temp: 98 F (36.7 C) 97.8 F (36.6 C) 97.6 F (36.4 C) (!) 97.3 F (36.3 C)  TempSrc:  Oral Oral Oral  SpO2: 98% 100%  100%  Weight:      Height:       Weight change:   Intake/Output Summary (Last 24 hours) at 11/15/2020 1114 Last data filed at 11/15/2020 0541 Gross per 24 hour  Intake 5884.31 ml  Output 1450 ml  Net 4434.31 ml       Labs: Basic Metabolic Panel: Recent Labs  Lab 11/10/20 0334 11/13/20 1622 11/13/20 1805 11/14/20 0334 11/14/20 0832 11/15/20 0238  NA 144 146*  --  147*  --  147*  K 3.9 3.9  --  3.3*  --  4.2  CL 110 112*  --  117*  --  118*  CO2 24 22  --  20*  --  19*  GLUCOSE 354* 251*  --  179*  --  129*  BUN 11 34*  --  35*  --  36*  CREATININE 1.00 4.79* 5.07* 5.34*  --  5.67*  CALCIUM 8.1* 7.5*  --  7.6*  --  7.3*  PHOS 2.6  --   --   --  4.4 4.2   Liver Function Tests: Recent Labs  Lab 11/13/20 1622 11/14/20 0832 11/15/20 0238  AST 19 15 18   ALT 6 <5 <5  ALKPHOS 228* 202* 194*  BILITOT 0.5 0.3 0.4  PROT 7.4 7.2 7.2  ALBUMIN 1.1* 1.0* 1.1*   No results for input(s): LIPASE, AMYLASE in the last 168 hours. No results for input(s): AMMONIA in the last 168 hours. CBC: Recent Labs  Lab 11/10/20 0334 11/11/20 0252 11/13/20 1622 11/14/20 0832 11/15/20 0238  WBC 6.1 9.7 9.9 7.9 7.0  NEUTROABS 3.4 6.4  --  5.5 4.9  HGB 9.3* 9.3* 9.2* 8.1* 9.1*  HCT 26.4* 27.8*  27.9* 23.2* 27.7*  MCV 78.3* 80.1 81.6 78.9* 81.2  PLT 505* 400 320 343 294   Cardiac Enzymes: No results for input(s): CKTOTAL, CKMB, CKMBINDEX, TROPONINI in the last 168 hours. CBG: Recent Labs  Lab 11/14/20 0755 11/14/20 1227 11/14/20 1626 11/14/20 2214 11/15/20 0631  GLUCAP 132* 130* 108* 105* 150*    Iron Studies: No results for input(s): IRON, TIBC, TRANSFERRIN, FERRITIN in the last 72 hours. Studies/Results: 11/17/20 RENAL  Result Date: 11/14/2020 CLINICAL DATA:  Acute kidney injury EXAM: RENAL / URINARY TRACT ULTRASOUND COMPLETE COMPARISON:  CT abdomen and pelvis October 21, 2020 FINDINGS: Right Kidney: Renal measurements: 10.9 x 4.4 x 5.9 cm = volume: 148.4 mL. Echogenicity and renal cortical thickness are within normal limits. No mass, perinephric fluid, or hydronephrosis visualized. No sonographically demonstrable calculus or ureterectasis. Left Kidney: Renal measurements: 9.3 x 5.6 x 4.9 cm = volume: 134.8 mL. Echogenicity and renal cortical thickness are within normal limits. No mass, perinephric fluid, or hydronephrosis visualized. No sonographically demonstrable calculus or ureterectasis. Bladder: Foley catheter within urinary bladder. Moderate urine seen in the bladder without bladder wall thickening. Other: Prostate measures 5.0 x 4.4 x 3.7 cm with a diffusely inhomogeneous appearance of the prostate. Several prostatic calculi evident. IMPRESSION: 1.  Normal appearing kidneys bilaterally. 2. Moderate urine in the bladder despite presence of Foley catheter. No lesions involving the urinary bladder evident. 3. Inhomogeneous prostate parenchyma with rather prominent size of prostate. Advise clinical assessment and PSA evaluation given this appearance. Electronically Signed   By: October 23, 2020 III M.D.   On: 11/14/2020 09:48    Medications: Infusions: . sodium chloride 250 mL (11/01/20 2145)  . sodium chloride 125 mL/hr at 11/15/20 0541  .  ceFAZolin (ANCEF) IV Stopped  (11/15/20 0411)  . dextrose 75 mL/hr at 11/15/20 0540    Scheduled Medications: . brimonidine  1 drop Both Eyes TID  . Chlorhexidine Gluconate Cloth  6 each Topical Daily  . diltiazem  60 mg Oral Q8H  . feeding supplement (GLUCERNA SHAKE)  237 mL Oral TID PC & HS  . heparin injection (subcutaneous)  5,000 Units Subcutaneous Q8H  . insulin aspart  0-5 Units Subcutaneous QHS  . insulin aspart  0-9 Units Subcutaneous TID WC  . insulin glargine  10 Units Subcutaneous QHS  . latanoprost  1 drop Both Eyes QHS  . metoprolol tartrate  50 mg Oral BID  . metoprolol tartrate  5 mg Intravenous Once  . mirtazapine  7.5 mg Oral QHS  . multivitamin with minerals  1 tablet Oral Daily  . mupirocin ointment   Topical BID  . pantoprazole sodium  40 mg Oral Daily  . rifampin  300 mg Oral Q12H  . sodium chloride flush  5 mL Intracatheter Q8H  have reviewed scheduled and prn medications.  Physical Exam: General: Ill-looking debilitated male lying on bed, confused, dry mucous membrane Heart:RRR, s1s2 nl Lungs:clear b/l, no crackle Abdomen:soft, Non-tender, non-distended Extremities:No LE edema Neurology: Alert but not following commands are oriented.  Makaylia Hewett Prasad Kitti Mcclish 11/15/2020,11:14 AM  LOS: 25 days  Pager: [phone]

## 2020-11-16 DIAGNOSIS — R7881 Bacteremia: Secondary | ICD-10-CM | POA: Diagnosis not present

## 2020-11-16 DIAGNOSIS — L0291 Cutaneous abscess, unspecified: Secondary | ICD-10-CM | POA: Diagnosis not present

## 2020-11-16 DIAGNOSIS — L02416 Cutaneous abscess of left lower limb: Secondary | ICD-10-CM | POA: Diagnosis not present

## 2020-11-16 DIAGNOSIS — A419 Sepsis, unspecified organism: Secondary | ICD-10-CM | POA: Diagnosis not present

## 2020-11-16 LAB — PHOSPHORUS: Phosphorus: 3.8 mg/dL (ref 2.5–4.6)

## 2020-11-16 LAB — COMPREHENSIVE METABOLIC PANEL
ALT: <5 U/L (ref 0–44)
AST: 16 U/L (ref 15–41)
Albumin: 2.7 g/dL — ABNORMAL LOW (ref 3.5–5.0)
Alkaline Phosphatase: 137 U/L — ABNORMAL HIGH (ref 38–126)
Anion gap: 12 (ref 5–15)
BUN: 34 mg/dL — ABNORMAL HIGH (ref 8–23)
CO2: 17 mmol/L — ABNORMAL LOW (ref 22–32)
Calcium: 7.6 mg/dL — ABNORMAL LOW (ref 8.9–10.3)
Chloride: 116 mmol/L — ABNORMAL HIGH (ref 98–111)
Creatinine, Ser: 5.54 mg/dL — ABNORMAL HIGH (ref 0.61–1.24)
GFR, Estimated: 10 mL/min — ABNORMAL LOW (ref >60)
Glucose, Bld: 79 mg/dL (ref 70–99)
Potassium: 3.2 mmol/L — ABNORMAL LOW (ref 3.5–5.1)
Sodium: 145 mmol/L (ref 135–145)
Total Bilirubin: 0.2 mg/dL — ABNORMAL LOW (ref 0.3–1.2)
Total Protein: 6.9 g/dL (ref 6.5–8.1)

## 2020-11-16 LAB — GLUCOSE, CAPILLARY
Glucose-Capillary: 80 mg/dL (ref 70–99)
Glucose-Capillary: 88 mg/dL (ref 70–99)
Glucose-Capillary: 98 mg/dL (ref 70–99)
Glucose-Capillary: 74 mg/dL (ref 70–99)
Glucose-Capillary: 83 mg/dL (ref 70–99)

## 2020-11-16 LAB — MAGNESIUM: Magnesium: 1.9 mg/dL (ref 1.7–2.4)

## 2020-11-16 MED ORDER — ACETAMINOPHEN 650 MG RE SUPP: 650.0000 mg | Freq: Four times a day (QID) | RECTAL | Status: DC | PRN

## 2020-11-16 MED ORDER — POTASSIUM CHLORIDE 20 MEQ PO PACK: 40.0000 meq | PACK | Freq: Once | ORAL | Status: DC

## 2020-11-16 MED ORDER — CALCIUM GLUCONATE-NACL 1-0.675 GM/50ML-% IV SOLN
1.0000 g | Freq: Once | INTRAVENOUS | Status: AC
  Administered 2020-11-16: 20:00:00 1000 mg via INTRAVENOUS
  Filled 2020-11-16: qty 50

## 2020-11-16 MED ORDER — ACETAMINOPHEN 325 MG PO TABS: 650.0000 mg | ORAL_TABLET | Freq: Four times a day (QID) | ORAL | Status: DC | PRN

## 2020-11-16 MED ORDER — POTASSIUM CHLORIDE 10 MEQ/100ML IV SOLN
10.0000 meq | INTRAVENOUS | Status: AC
  Administered 2020-11-16 (×4): 10 meq via INTRAVENOUS
  Filled 2020-11-16 (×4): qty 100

## 2020-11-16 NOTE — TOC Progression Note (Signed)
Transition of Care (TOC) - Progression Note    Patient Details  Name: Marcus Downs. MRN: [id number] Date of Birth: 08-14-1941  Transition of Care [facility]) CM/SW Contact  Janae Bridgeman, RN Phone Number: 11/16/2020, 11:02 AM  Clinical Narrative:    Case management will continue to follow the patient for transitions of care needs.  Palliative medicine is following the patient for patient and family support.  The patient was admitted from [facility] and is not medically stable at this time and is being followed by Palliative care and Hospitalist   Expected Discharge Plan:  (Residential Hospice) Barriers to Discharge: Hospice Bed not available  Expected Discharge Plan and Services Expected Discharge Plan:  (Residential Hospice) In-house Referral: NA Discharge Planning Services: CM Consult   Living arrangements for the past 2 months: Single Family Home                 DME Arranged: N/A DME Agency: NA       HH Arranged: NA HH Agency: NA         Social Determinants of Health (SDOH) Interventions    Readmission Risk Interventions Readmission Risk Prevention Plan 11/01/2020  Transportation Screening Complete  PCP or Specialist Appt within 3-5 Days Complete  HRI or Home Care Consult Not Complete  HRI or Home Care Consult comments n/a returning back to snf  Social Work Consult for Recovery Care Planning/Counseling Complete  Palliative Care Screening Complete  Medication Review [employer]) Referral to Pharmacy  Some recent data might be hidden

## 2020-11-16 NOTE — Progress Notes (Signed)
PROGRESS NOTE    Marcus Downs.  KPT:[id number] DOB: June 09, 1941 DOA: 10/24/2020 PCP: Ria Bush, MD   Brief Narrative:  Patient is a 79 year old African-American male with a past medical history significant for but not limited to vascular dementia, previous CVA with residual left-sided hemiplegia, essential hypertension, type 2 diabetes mellitus, dysphagia and hyperlipidemia as well as chronic urine retention with indwelling Foley catheter secondary to BPH and recent left femoral neck fracture status post left hemiarthroplasty on 08/08/2020 who presented from his nursing facility with altered mental status, agitation and acute kidney injury with electrolyte abnormalities with hyponatremia.  He was in septic shock and found to have an abscess around his arthroplasty hardware and an MSSA bacteremia and was deemed not a surgical candidate.  He was empirically treated with IV antibiotics for his MSSA infection and ended up having a left hip drain. He was transferred back to [facility] service 10/24/20. Overall his prognosis and clinical status remained poor and my colleagues have had multiple conversations with family about palliative care or hospice and family not ready yet.  On 11/08/2020 is pain to tube was removed and he was eating about 50% of his meals but this is not been adequately documented.  We will ask for a calorie count to see if the patient actually nutrition orally.  If he does not he may be hospice appropriate.  Palliative care following appreciate continue goals of care discussion.   Interim History Intermittently he has not been awake enough to participate in Examination and so ? How much he is really taking in orally. IR evaluated and patient's left hip abscess drain is intact and there can continue to follow and recommending a repeat CT scan when his output is minimal and less than 10 cc daily.   On 11/09/2020 patient had better intake with his Magic cup and will start obstruction.   Calorie count was extended for another day.  Lantus was added for the patient on 11/10/2020.  On day 3 of his calorie count patient continued to do well with Magic cup with excellent consumption even though meal consumption was poor.  Patient blood sugars were in 300 so they continue with same as opposed to more nutritionally dense Ensure supplements.  Nutritionist recommended trialing Nucynta a fourth time to help patient meet his calorie count and protein needs.  Calorie count was continued throughout the weekend and per facility he needed to eat at least 90% of his meals prior to discharge but unfortunately no meal documentation was obtained over the weekend and nutritionist was unable to provide any additional information regarding patient's p.o consumption.Marland Kitchen  He did not want another PEG tube Dobbhoff.  In the interim patient intermittent acute kidney injury and worsening renal status.  He was trialed on mirtazapine for appetite stimulant.  Family wants to continue IV antibiotic course for now.  On 12/25 2020 when he had no significant p.o. intake and was not even alert enough to take his medication.  He was started on <5 MLS per hour since his poor p.o. intake.  After further goals of care discussion with palliative care medicine patient was changed to DO NOT RESUSCITATE.  Palliative continues to meet with the patient and the family about goals of care.  Nephrology was consulted given his AKI and they feel it is in the setting of severe dehydration with concomitant hemodynamic changes/hypotension.  Dr. Carolin Sicks is unable to rule out and related antibiotic use.  We have repeated a urinalysis, order bladder  scan and renal ultrasound which has ruled out obstruction.  Patient was given a 500 mL bolus and started on normal saline at 125 MLS per hour.  He is not a candidate for dialysis treatment per nephrology recommendations  11/15/2020: Patient has some facial twitching likely in the setting of his uremia.   Continues to have severe dehydration and has been predisposed to his AKI because of his prior AKI.  They are recommending continuing IV fluid hydration given that they think he still dry and because he has no oral intake.  They also ordered him some albumin IV and recommending strict I's and O's and avoid hypotensive episodes.  His functional status is very poor and he is not a dialysis candidate.  They are recommending continuing half-normal saline at 125 mils per hour and patient is also getting D5W at 75 MLS per hour.  11/16/2028: Patient did not eat any of his meal today and has been off all his medications.  Any continued goals of care discussion and nephrology is continuing his fluids for now given his severe dehydration.  Assessment & Plan:   Principal Problem:   MSSA bacteremia Active Problems:   Protein-calorie malnutrition, severe (HCC)   Encounter for nasogastric (NG) tube placement   Severe sepsis (HCC)   Abscess of left hip   Hypernatremia   Abscess   Acute kidney injury (nontraumatic) (HCC)   Dehydration   Elevated liver function tests   Prosthetic hip infection (HCC)   Hyperkalemia   MSSA bacteremia/sepsis secondary to MSSA left hip abscess with septic shock: Prosthetic hip infection. poA Catheter Associated UTI -Treated with vasopressors and weaned off. -Blood cultures 12/4, MSSA bacteremia -Hip abscess 12/4, MSSA -Urine culture 12/4, MSSA -Repeat blood cultures negative.  Echocardiogram without any vegetation.   -Not a good candidate for operative removal/spacer or multistage surgery. -IR guided abscess drain, continues to drain.  Overall with poor clinical improvement. -Currently remains on cefazolin and rifampicin.  Total 6 weeks of antibiotics planned. -Antibiotic end date 12/03/2020. -IR evaluated and recommending repeating CT scanning when his output from his drain is minimal less than 10 cc/day -Oral intake has been extremely poor throughout the weekend and  has declined his NG tube. -Plan was to transfer to nursing home in next 24 to 48 hours if he is able to eat at least 50% of his meals but calorie count is inaccurate and will need to ensure that patient is adequately meeting his needs; if he is not tolerating p.o. left need to reevaluate goals of care discussion and he may be hospice appropriate as NG tube will not be replaced and PEG tube is not desired. -Patient has been doing good with his listener shakes p.o. 4 times daily and Magic of 3 times daily with meals and multivitamins however calorie count has not been discontinued given lack of nursing documentation -He has been started back on IV fluids and nephrology been consulted for AKI -Palliative care is involved in goals of care discussion and after discussion family wants to continue IV antibiotics at SNF with palliative however if kidney function continues to worsen of care will be recommending hospice house. -Patient has a very high risk of further decompensation and decline  SVT and Sinus Tachycardia -Improving.  Treated with Cardizem.   -Now on Cardizem 60 mg po q8h and Metoprolol Tartrate 50 mg po BID and IV Metoprolol 2.5 IV q6hprn for HR>120 Sustained -Currently HR acceptable ranging from from 89-107 -TSH 1.6.  Acute metabolic encephalopathy  in the setting of history of stroke and underlying vascular dementia -He has not made very much improvement in order course of his hospitalization and was somnolent today -Repeat MRI with no evidence of acute process.  Does have history of vascular dementia.   -Followed by palliative.  -Hospice recommended, family declined and could not make decisions. 12/20, patient started eating some but was not awake enough to take anything orally today.  Dobbhoff tube discontinued. -Initially he has had some improvement in mental status, however overall remains in very debilitated state; number to interact with me today but I cannot understand what he is  saying he is very confused.  His albumin was 1.1 today -Calorie count has  Now been discontinued given an adequate documentation by nursing.  May need to reinitiate given that he did not eat anything today  Hypocalcemia -Patient's calcium was 7.6 -Albumin not checked for corrected calcium -Replete with IV calcium gluconate 1 g again -Corrected Calcium is 8.6 -We will continue to monitor and trend and repeat CMP in a.m.  Thrombocytosis -Likely reactive  -Patient's platelet count went from 455 -> 499 -> 405 and is now worsened to 531 and is trended down and improved to 343 the day before yesterday and yesterday was 294 not repeated today and will repeat again in the morning -Continue Monitor and Trend -Repeat CBC in the AM  Hypernatremia -In the setting of dehydration from poor oral intake -Patient's sodium is now 145 -Continue with fluid hydration as well per nephrology -Continue to monitor and trend and repeat CMP in a.m.  Microcytic Anemia -Patient's Hgb/Hct is relatively stable at 8.1/23.2 the day before yesterday and this yesterday is now 9.1/27.7; will repeat tomorrow morning as it was not repeated today -Checked anemia panel showed iron level of 25, U IBC 108, TIBC 133, saturation of 90%, ferritin of 559, folate level of 7.9, vitamin B12 332 -Continue to Monitor for S/Sx of Bleeding; Currently no overt bleeding noted -Repeat CBC in the AM  GERD/GI Prophylaxis -C/w Pantoprazole 40 mg po Daily   Acute Kidney Injury with Hypernatremia, worsened  Metabolic acidosis -Sodium level improved with increasing free water flush.   -Renal function and sodium have both normalized but significantly elevated yesterday -Patient's sodium this morning was 147 and patient's BUNs/creatinine has gone from 11/1.00 on the 23rd December and has significantly worsened and 36/5.67 yesterday when it peaked and is now slowly coming down at 34/5.54 -will obtain a urinalysis, urine culture, bladder scan,  urine electrolytes as well as renal ultrasound -Nephrology was consulted for further evaluation and Dr. Ronalee Belts adjusting his fluids and he is now on half-normal saline at 125 mL/h and D5W at 75 mils per hour: Follow-up care per nephrology and appreciate notes from nephrology -patient now has a CO2 of 17, anion gap of 12, chloride level of 116 -Avoid nephrotoxic medications, contrast dyes, hypotension and renally dose medications -Continue to monitor and trend and repeat CMP in a.m.  Type 2 Diabetes, uncontrolled with Hyperglycemia -> Hypoglycemia -HbA1c 8.1.  -On 12/20, hypoglycemic episodes after stopping tube feeding and continuing on long-acting insulin. -Decrease dose of long-acting insulin, sliding scale insulin to sensitive scale. He is now on Sensitive Novolog SSI AC/HS -If he is not eating well, he will not need that much insulin and is now on 10 units of Lantus subcu daily -CBGs ranging from 80-290; Continue to monitor and adjust blood sugars as necessary  Dysphagia -Remained with poor mentation again today.  Initially was eating  with help but his oral intake is not being properly documented.   -He does not want a PEG tube  -No reinsertion of Dobbhoff tube recommended.   -We can see next few days and continue to have palliative discussion and if he is not improving may be hospice appropriate  Pressure Injury Pressure Injury 08/07/20 Buttocks Right;Medial;Left Stage 2 -  Partial thickness loss of dermis presenting as a shallow open injury with a red, pink wound bed without slough. (Active)  08/07/20 1914  Location: Buttocks  Location Orientation: Right;Medial;Left  Staging: Stage 2 -  Partial thickness loss of dermis presenting as a shallow open injury with a red, pink wound bed without slough.  Wound Description (Comments):   Present on Admission: Yes     Pressure Injury 11/02/20 Penis (Active)  11/02/20 0344  Location: Penis (distal meatus)  Location Orientation:    Staging:   Wound Description (Comments):   Present on Admission:    Severe Malnutrition in the Context of Chronic Illness -Estimated body mass index is 21.08 kg/m as calculated from the following:   Height as of this encounter: 6' (1.829 m).   Weight as of this encounter: 70.5 kg.  -Nutrition Problem: Severe Malnutrition -Etiology: chronic illness (Dementia) -Signs/Symptoms: severe fat depletion,severe muscle depletion,percent weight loss (15.4% weight loss in 9 months) -Percent weight loss: 15.4 % (less than 9 months) -Interventions: Tube feeding,Prostat,MVI; TF now discontinued and Panda Tube Removed -Nutritionist consulted for further evaluation and recommendations -C/w 48 hour Calorie Count but she has not been keeping track of his meals appropriately so we will need to extend this out given the very limited no documentation abdomen completed by nursing staff.  Nutrition was stressing the importance of keeping accurate documentation and it is unclear if the patient is consuming his supplements well.  Will need to make an informed decision of whether he is able to adequately meet his nutritional needs orally we will need to have continued goals of care discussion and possible transfer to hospice if he does not -Nutritionist recommending c/w Glucerna Shake po TID, Magic Cup TIDwm, and MVI Daily; stressed importance of obtaining a calorie count -Patient is not eating very much and documentation of the calorie count has now stopped. -We will need to continue goals of care discussion given that today he did not eat anything and spat out his medications and was more somnolent  DVT prophylaxis: Heparin 5,000 units sq q8h Code Status: DO NOT RESUSCITATE  Family Communication: No family present at bedside  Disposition Plan: Anticipating D/C to SNF if able to tolerate po Intake adequately vs Hospice Appropriate; will need continued goals of care discussion as he is not eating significantly and  will return to follow-up CARE time  Status is: Inpatient  Remains inpatient appropriate because:Unsafe d/c plan, IV treatments appropriate due to intensity of illness or inability to take PO and Inpatient level of care appropriate due to severity of illness   Dispo:  Patient From:  Home  Planned Disposition:  SNF  Expected discharge date: 11/17/2020  Medically stable for discharge:  No  Consultants:   Palliative Care  Infectious Diseases  Interventional Radiology  PCCM Transfer  Orthopedic Surgery  Nephrology   Procedures:  Left hip abscess s/p abscess drain to the left thigh on 20-Nov-2020  Antimicrobials: Anti-infectives (From admission, onward)   Start     Dose/Rate Route Frequency Ordered Stop   11/14/20 0300  ceFAZolin (ANCEF) IVPB 1 g/50 mL premix  1 g 100 mL/hr over 30 Minutes Intravenous Every 12 hours 11/13/20 1952     11/08/20 2200  rifampin (RIFADIN) capsule 300 mg  Status:  Discontinued        300 mg Oral Every 12 hours 11/08/20 1319 11/08/20 1322   11/08/20 2200  rifampin (RIFADIN) 25 mg/mL oral suspension 300 mg        300 mg Oral Every 12 hours 11/08/20 1322     11/06/20 2200  rifampin (RIFADIN) 25 mg/mL oral suspension 300 mg  Status:  Discontinued        300 mg Per Tube Every 12 hours 11/06/20 1008 11/08/20 1319   10/26/20 1100  rifampin (RIFADIN) 300 mg in sodium chloride 0.9 % 100 mL IVPB  Status:  Discontinued        300 mg 200 mL/hr over 30 Minutes Intravenous Every 12 hours 10/26/20 1007 11/06/20 1008   10/24/20 0815  ceFAZolin (ANCEF) IVPB 2g/100 mL premix  Status:  Discontinued        2 g 200 mL/hr over 30 Minutes Intravenous Every 8 hours 10/24/20 0804 11/13/20 1952   10/23/20 2200  ceFAZolin (ANCEF) IVPB 2g/100 mL premix  Status:  Discontinued        2 g 200 mL/hr over 30 Minutes Intravenous Every 12 hours 10/23/20 1406 10/24/20 0804   10/22/20 1000  ceFAZolin (ANCEF) IVPB 1 g/50 mL premix  Status:  Discontinued        1 g 100 mL/hr  over 30 Minutes Intravenous Every 12 hours November 02, 2020 1745 10/23/20 1406   11/02/20 2200  ceFAZolin (ANCEF) IVPB 2g/100 mL premix        2 g 200 mL/hr over 30 Minutes Intravenous  Once Nov 02, 2020 1739 11/02/20 2239   11/02/2020 2100  cefTRIAXone (ROCEPHIN) 2 g in sodium chloride 0.9 % 100 mL IVPB  Status:  Discontinued        2 g 200 mL/hr over 30 Minutes Intravenous Every 24 hours 2020-11-02 1117 11/02/20 1129   11-02-2020 1730  linezolid (ZYVOX) IVPB 600 mg  Status:  Discontinued        600 mg 300 mL/hr over 60 Minutes Intravenous Every 12 hours 2020-11-02 1154 11/02/20 1739   02-Nov-2020 1130  cefTRIAXone (ROCEPHIN) 2 g in sodium chloride 0.9 % 100 mL IVPB  Status:  Discontinued        2 g 200 mL/hr over 30 Minutes Intravenous Every 24 hours 11/02/2020 1129 Nov 02, 2020 1739   11-02-20 1000  meropenem (MERREM) 500 mg in sodium chloride 0.9 % 100 mL IVPB  Status:  Discontinued        500 mg 200 mL/hr over 30 Minutes Intravenous Every 12 hours 11/02/20 0330 2020/11/02 1117   November 02, 2020 0400  linezolid (ZYVOX) IVPB 600 mg        600 mg 300 mL/hr over 60 Minutes Intravenous  Once 11-02-20 0309 11-02-20 0654   02-Nov-2020 0215  ceFEPIme (MAXIPIME) 2 g in sodium chloride 0.9 % 100 mL IVPB        2 g 200 mL/hr over 30 Minutes Intravenous  Once 2020/11/02 0212 02-Nov-2020 0306   11-02-20 0215  ampicillin-sulbactam (UNASYN) 1.5 g in sodium chloride 0.9 % 100 mL IVPB  Status:  Discontinued        1.5 g 200 mL/hr over 30 Minutes Intravenous  Once 2020-11-02 0212 11-02-2020 0214   2020/11/02 0215  vancomycin (VANCOCIN) IVPB 1000 mg/200 mL premix  Status:  Discontinued        1,000 mg  200 mL/hr over 60 Minutes Intravenous  Once October 30, 2020 7829 2020-10-30 0314        Subjective: Seen and examined at bedside and he was more somnolent today and not very interactive, no sleep.  He continued to have some twitching in his mouth.  Nursing states he did wake up he did not eat anything and he was by his medications.  We will continue IV fluids  per nephrology recommendations.  We will continue goals of care discussion given that he is continue his antibiotics but likely if he does not eat would be hospice appropriate.  We will have palliative care discussions again and speak with the palliative care team in the morning.  Objective: Vitals:   11/15/20 2306 11/16/20 0616 11/16/20 0716 11/16/20 1450  BP: (!) 109/53 (!) 106/55 129/61 (!) 140/93  Pulse: 94 89 87 79  Resp: 17 16 16 17   Temp: 97.9 F (36.6 C) 97.7 F (36.5 C) 97.6 F (36.4 C) (!) 97.4 F (36.3 C)  TempSrc: Oral Oral Axillary Axillary  SpO2: 100% 100% 99% 100%  Weight:      Height:        Intake/Output Summary (Last 24 hours) at 11/16/2020 1824 Last data filed at 11/16/2020 1812 Gross per 24 hour  Intake 4631.89 ml  Output 1795 ml  Net 2836.89 ml   Filed Weights   11/02/20 0825 11/05/20 0500 11/09/20 0451  Weight: 71.5 kg 72.8 kg 70.5 kg   Examination: Physical Exam:  Constitutional: Patient is a very thin chronically ill-appearing African-American male who is in no acute distress and is calm and resting the day and he does respond to painful stimuli. Eyes: Lids and conjunctivae normal, sclerae anicteric  ENMT: External Ears, Nose appear normal. Grossly normal hearing.  Does have some mouth twitching while still in the sleep..  Mucous membranes remain dry Neck: Appears normal, supple, no cervical masses, normal ROM, no appreciable thyromegaly: No JVD Respiratory: Diminished to auscultation bilaterally breath sounds, no wheezing, rales, rhonchi or crackles. Normal respiratory effort and patient is not tachypenic. No accessory muscle use.  Unlabored breathing Cardiovascular: RRR, no murmurs / rubs / gallops. S1 and S2 auscultated.  No appreciable extremity edema Abdomen: Soft, mildly tender in, non-distended. Bowel sounds positive.  GU: Deferred. Musculoskeletal: No clubbing / cyanosis of digits/nails.  Has a drain in the left hip Skin: No rashes, lesions,  ulcers on limited skin evaluation. No induration; Warm and dry.  Neurologic: CN 2-12 grossly intact with no focal deficits.  Romberg sign and cerebellar reflexes not assessed.  Psychiatric: Continues to have an impaired judgment and insight.  He is somnolent and drowsy and not fully alert. Normal mood and appropriate affect.    Data Reviewed: I have personally reviewed following labs and imaging studies  CBC: Recent Labs  Lab 11/10/20 0334 11/11/20 0252 11/13/20 1622 11/14/20 0832 11/15/20 0238  WBC 6.1 9.7 9.9 7.9 7.0  NEUTROABS 3.4 6.4  --  5.5 4.9  HGB 9.3* 9.3* 9.2* 8.1* 9.1*  HCT 26.4* 27.8* 27.9* 23.2* 27.7*  MCV 78.3* 80.1 81.6 78.9* 81.2  PLT 505* 400 320 343 294   Basic Metabolic Panel: Recent Labs  Lab 11/10/20 0334 11/13/20 1622 11/13/20 1805 11/14/20 0334 11/14/20 0832 11/15/20 0238 11/16/20 0213  NA 144 146*  --  147*  --  147* 145  K 3.9 3.9  --  3.3*  --  4.2 3.2*  CL 110 112*  --  117*  --  118* 116*  CO2  24 22  --  20*  --  19* 17*  GLUCOSE 354* 251*  --  179*  --  129* 79  BUN 11 34*  --  35*  --  36* 34*  CREATININE 1.00 4.79* 5.07* 5.34*  --  5.67* 5.54*  CALCIUM 8.1* 7.5*  --  7.6*  --  7.3* 7.6*  MG 1.8  --   --   --  2.2 2.0 1.9  PHOS 2.6  --   --   --  4.4 4.2 3.8   GFR: Estimated Creatinine Clearance: 11 mL/min (A) (by C-G formula based on SCr of 5.54 mg/dL (H)). Liver Function Tests: Recent Labs  Lab 11/10/20 0334 11/13/20 1622 11/14/20 0832 11/15/20 0238 11/16/20 0213  AST 18 19 15 18 16   ALT 9 6 <5 <5 <5  ALKPHOS 305* 228* 202* 194* 137*  BILITOT 0.7 0.5 0.3 0.4 0.2*  PROT 8.1 7.4 7.2 7.2 6.9  ALBUMIN 1.3* 1.1* 1.0* 1.1* 2.7*   No results for input(s): LIPASE, AMYLASE in the last 168 hours. No results for input(s): AMMONIA in the last 168 hours. Coagulation Profile: No results for input(s): INR, PROTIME in the last 168 hours. Cardiac Enzymes: No results for input(s): CKTOTAL, CKMB, CKMBINDEX, TROPONINI in the last 168  hours. BNP (last 3 results) No results for input(s): PROBNP in the last 8760 hours. HbA1C: No results for input(s): HGBA1C in the last 72 hours. CBG: Recent Labs  Lab 11/15/20 2312 11/15/20 2343 11/16/20 0624 11/16/20 1120 11/16/20 1724  GLUCAP 54* 117* 80 88 98   Lipid Profile: No results for input(s): CHOL, HDL, LDLCALC, TRIG, CHOLHDL, LDLDIRECT in the last 72 hours. Thyroid Function Tests: No results for input(s): TSH, T4TOTAL, FREET4, T3FREE, THYROIDAB in the last 72 hours. Anemia Panel: No results for input(s): VITAMINB12, FOLATE, FERRITIN, TIBC, IRON, RETICCTPCT in the last 72 hours. Sepsis Labs: No results for input(s): PROCALCITON, LATICACIDVEN in the last 168 hours.  Recent Results (from the past 240 hour(s))  Resp Panel by RT-PCR (Flu A&B, Covid) Nasopharyngeal Swab     Status: None   Collection Time: 11/10/20  3:16 AM   Specimen: Nasopharyngeal Swab; Nasopharyngeal(NP) swabs in vial transport medium  Result Value Ref Range Status   SARS Coronavirus 2 by RT PCR NEGATIVE NEGATIVE Final    Comment: (NOTE) SARS-CoV-2 target nucleic acids are NOT DETECTED.  The SARS-CoV-2 RNA is generally detectable in upper respiratory specimens during the acute phase of infection. The lowest concentration of SARS-CoV-2 viral copies this assay can detect is 138 copies/mL. A negative result does not preclude SARS-Cov-2 infection and should not be used as the sole basis for treatment or other patient management decisions. A negative result may occur with  improper specimen collection/handling, submission of specimen other than nasopharyngeal swab, presence of viral mutation(s) within the areas targeted by this assay, and inadequate number of viral copies(<138 copies/mL). A negative result must be combined with clinical observations, patient history, and epidemiological information. The expected result is Negative.  Fact Sheet for Patients:   11/12/20  Fact Sheet for Healthcare Providers:  [employer]  This test is no t yet approved or cleared by the [employer] FDA and  has been authorized for detection and/or diagnosis of SARS-CoV-2 by FDA under an Emergency Use Authorization (EUA). This EUA will remain  in effect (meaning this test can be used) for the duration of the COVID-19 declaration under Section 564(b)(1) of the Act, 21 U.S.C.section 360bbb-3(b)(1), unless the authorization is terminated  or revoked sooner.       Influenza A by PCR NEGATIVE NEGATIVE Final   Influenza B by PCR NEGATIVE NEGATIVE Final    Comment: (NOTE) The Xpert Xpress SARS-CoV-2/FLU/RSV plus assay is intended as an aid in the diagnosis of influenza from Nasopharyngeal swab specimens and should not be used as a sole basis for treatment. Nasal washings and aspirates are unacceptable for Xpert Xpress SARS-CoV-2/FLU/RSV testing.  Fact Sheet for Patients: [email or web address]  Fact Sheet for Healthcare Providers: SeriousBroker.it  This test is not yet approved or cleared by the [place] FDA and has been authorized for detection and/or diagnosis of SARS-CoV-2 by FDA under an Emergency Use Authorization (EUA). This EUA will remain in effect (meaning this test can be used) for the duration of the COVID-19 declaration under Section 564(b)(1) of the Act, 21 U.S.C. section 360bbb-3(b)(1), unless the authorization is terminated or revoked.  Performed at [facility], [address] [place], [place] [address]   Culture, Urine     Status: Abnormal   Collection Time: 11/14/20 10:37 AM   Specimen: Urine, Random  Result Value Ref Range Status   Specimen Description URINE, RANDOM  Final   Special Requests   Final    NONE Performed at [facility], [address] [place], [place] [address]    Culture >=100,000  COLONIES/mL YEAST (A)  Final   Report Status 11/15/2020 FINAL  Final     RN Pressure Injury Documentation: Pressure Injury 08/07/20 Buttocks Right;Medial;Left Stage 2 -  Partial thickness loss of dermis presenting as a shallow open injury with a red, pink wound bed without slough. (Active)  08/07/20 1914  Location: Buttocks  Location Orientation: Right;Medial;Left  Staging: Stage 2 -  Partial thickness loss of dermis presenting as a shallow open injury with a red, pink wound bed without slough.  Wound Description (Comments):   Present on Admission: Yes     Pressure Injury 11/02/20 Penis (Active)  11/02/20 0344  Location: Penis  Location Orientation:   Staging:   Wound Description (Comments):   Present on Admission:      Estimated body mass index is 21.08 kg/m as calculated from the following:   Height as of this encounter: 6' (1.829 m).   Weight as of this encounter: 70.5 kg.  Malnutrition Type:  Nutrition Problem: Severe Malnutrition Etiology: chronic illness (dementia)   Malnutrition Characteristics:  Signs/Symptoms: severe fat depletion,severe muscle depletion,percent weight loss (15.4% weight loss in 9 months) Percent weight loss: 15.4 % (less than 9 months)   Nutrition Interventions:  Interventions: Glucerna shake,MVI,Magic cup,Calorie Count    Radiology Studies: No results found.  Scheduled Meds: . brimonidine  1 drop Both Eyes TID  . Chlorhexidine Gluconate Cloth  6 each Topical Daily  . diltiazem  60 mg Oral Q8H  . feeding supplement (GLUCERNA SHAKE)  237 mL Oral TID PC & HS  . heparin injection (subcutaneous)  5,000 Units Subcutaneous Q8H  . insulin aspart  0-5 Units Subcutaneous QHS  . insulin aspart  0-9 Units Subcutaneous TID WC  . insulin glargine  10 Units Subcutaneous QHS  . latanoprost  1 drop Both Eyes QHS  . metoprolol tartrate  50 mg Oral BID  . metoprolol tartrate  5 mg Intravenous Once  . mirtazapine  7.5 mg Oral QHS  . multivitamin  with minerals  1 tablet Oral Daily  . mupirocin ointment   Topical BID  . pantoprazole sodium  40 mg Oral Daily  . rifampin  300 mg Oral Q12H  . sodium chloride flush  5 mL Intracatheter Q8H   Continuous Infusions: . sodium chloride 125 mL/hr at 11/16/20 1758  . sodium chloride 250 mL (11/01/20 2145)  .  ceFAZolin (ANCEF) IV 1 g (11/16/20 1500)  . dextrose 75 mL/hr at 11/16/20 1341  . potassium chloride 10 mEq (11/16/20 1812)    LOS: 26 days   Merlene Laughtermair Latif Alanson Hausmann, DO [facility] PAGER is on AMION  If 7PM-7AM, please contact night-coverage [email or web address]

## 2020-11-16 NOTE — Progress Notes (Addendum)
[facility] NEPHROLOGY PROGRESS NOTE  Assessment/ Plan: Pt is a 79 y.o. yo male  with past medical history of stroke with residual left-sided hemiplegia, vascular dementia, hypertension, DM, dysphagia, HLD, chronic urinary retention on indwelling Foley catheter due to BPH, recent left femoral neck fracture required hemiarthroplasty in 07/2020 admitted on 12/4 from SNF for the concern of UTI, sepsis seen as a consultation for the evaluation of AKI.  #Acute kidney injury, nonoliguric: Likely due to severe dehydration concomitant with hemodynamic change/hypotension.  He is predisposed to AKI because of previous AKI, sepsis/MSSA bacteremia and chronic bladder outlet obstruction.  Unable to rule out AIN related to antibiotics use. Urinalysis with bacteriuria, pyuria. The kidney ultrasound ruled out obstruction. Poor oral intake and very dry on physical exam.  Treated with IV fluid and albumin.  Urine output 1.6 L and creatinine level stable today.  Continue IV fluid and watch for renal recovery.   Strict ins and out, avoid hypotensive episode or nephrotoxins. Given severe dementia, very poor functional status and multiple comorbidities, he is not a candidate for dialysis treatment.  Noted he is DNR and he was seen by palliative care team.  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  Blood culture growing MSSA and currently on cefazolin and rifampicin.  Echo without any vegetation.  For now continue antibiotics.  #Severe vascular dementia, stroke and may have some acute metabolic encephalopathy: MRI of the brain with chronic infarction and cerebral atrophy.  Continue supportive and palliative care.  #Severe protein calorie malnutrition, chronic dysphagia: Very poor oral intake per nursing staff.  Per report he declined feeding tube placement.  IV fluid for hydration.  #Hyponatremia probably due to  dehydration.    Improved with IV fluid.  Now sodium level went up therefore changing to half NS.  #Hypokalemia: Replete potassium.  Severe dementia and not eating much.  Subjective: Seen and examined at bedside.  Remains confused and lying on bed.  Urine output 1.6 L.  Unable to obtain review of system. Objective Vital signs in last 24 hours: Vitals:   11/15/20 1700 11/15/20 2306 11/16/20 0616 11/16/20 0716  BP: (!) 154/82 (!) 109/53 (!) 106/55 129/61  Pulse: 89 94 89 87  Resp: 16 17 16 16   Temp: 97.8 F (36.6 C) 97.9 F (36.6 C) 97.7 F (36.5 C)   TempSrc: Oral Oral Oral   SpO2: 97% 100% 100% 99%  Weight:      Height:       Weight change:   Intake/Output Summary (Last 24 hours) at 11/16/2020 1032 Last data filed at 11/16/2020 0600 Gross per 24 hour  Intake 2213.78 ml  Output 1695 ml  Net 518.78 ml       Labs: Basic Metabolic Panel: Recent Labs  Lab 11/14/20 0334 11/14/20 0832 11/15/20 0238 11/16/20 0213  NA 147*  --  147* 145  K 3.3*  --  4.2 3.2*  CL 117*  --  118* 116*  CO2 20*  --  19* 17*  GLUCOSE 179*  --  129* 79  BUN 35*  --  36* 34*  CREATININE 5.34*  --  5.67* 5.54*  CALCIUM 7.6*  --  7.3* 7.6*  PHOS  --  4.4 4.2 3.8   Liver Function Tests: Recent Labs  Lab 11/14/20 0832 11/15/20 0238 11/16/20 0213  AST 15 18 16   ALT <5 <5 <5  ALKPHOS 202* 194* 137*  BILITOT 0.3  0.4 0.2*  PROT 7.2 7.2 6.9  ALBUMIN 1.0* 1.1* 2.7*   No results for input(s): LIPASE, AMYLASE in the last 168 hours. No results for input(s): AMMONIA in the last 168 hours. CBC: Recent Labs  Lab 11/10/20 0334 11/11/20 0252 11/13/20 1622 11/14/20 0832 11/15/20 0238  WBC 6.1 9.7 9.9 7.9 7.0  NEUTROABS 3.4 6.4  --  5.5 4.9  HGB 9.3* 9.3* 9.2* 8.1* 9.1*  HCT 26.4* 27.8* 27.9* 23.2* 27.7*  MCV 78.3* 80.1 81.6 78.9* 81.2  PLT 505* 400 320 343 294   Cardiac Enzymes: No results for input(s): CKTOTAL, CKMB, CKMBINDEX, TROPONINI in the last 168 hours. CBG: Recent Labs  Lab  11/15/20 1127 11/15/20 1719 11/15/20 2312 11/15/20 2343 11/16/20 0624  GLUCAP 154* 290* 54* 117* 80    Iron Studies: No results for input(s): IRON, TIBC, TRANSFERRIN, FERRITIN in the last 72 hours. Studies/Results: No results found.  Medications: Infusions: . sodium chloride 125 mL/hr at 11/16/20 0936  . sodium chloride 250 mL (11/01/20 2145)  .  ceFAZolin (ANCEF) IV 1 g (11/16/20 0256)  . dextrose 75 mL/hr at 11/15/20 2323    Scheduled Medications: . brimonidine  1 drop Both Eyes TID  . Chlorhexidine Gluconate Cloth  6 each Topical Daily  . diltiazem  60 mg Oral Q8H  . feeding supplement (GLUCERNA SHAKE)  237 mL Oral TID PC & HS  . heparin injection (subcutaneous)  5,000 Units Subcutaneous Q8H  . insulin aspart  0-5 Units Subcutaneous QHS  . insulin aspart  0-9 Units Subcutaneous TID WC  . insulin glargine  10 Units Subcutaneous QHS  . latanoprost  1 drop Both Eyes QHS  . metoprolol tartrate  50 mg Oral BID  . metoprolol tartrate  5 mg Intravenous Once  . mirtazapine  7.5 mg Oral QHS  . multivitamin with minerals  1 tablet Oral Daily  . mupirocin ointment   Topical BID  . pantoprazole sodium  40 mg Oral Daily  . rifampin  300 mg Oral Q12H  . sodium chloride flush  5 mL Intracatheter Q8H    have reviewed scheduled and prn medications.  Physical Exam: General: Ill-looking debilitated male lying on bed, confused, dry mucous membrane, not much change from yesterday. Heart:RRR, s1s2 nl Lungs:clear b/l, no crackle Abdomen:soft, Non-tender, non-distended Extremities:No LE edema Neurology: Alert but not following commands are oriented.  Sherolyn Trettin Prasad Bonnell Placzek 11/16/2020,10:32 AM  LOS: 26 days  Pager: [phone]

## 2020-11-16 NOTE — Progress Notes (Incomplete)
Attempted to feed magic cup as well as syringe feed a Breeze- mainly everything put in poured out his mouth did take a few swallows- no choking norted- notified [facility] on call was holding lantis insulin due to low glucose and poor intake.

## 2020-11-16 NOTE — Consult Note (Signed)
   [facility] CM Inpatient Consult   11/16/2020  Marcus Downs. [birth year] [id number]  Follow up:  Medicare   Unplanned Readmission Risk Score: Extreme high  Disposition: Not determined completely. Following with inpatient [facility] team and palliative care for transitional care needs.   Plan: Continue to follow for progress and disposition.  Charlesetta Shanks, RN BSN CCM [facility]  [phone] business mobile phone Toll free office [phone]  Fax number: [phone] [email or web address]

## 2020-11-16 NOTE — Progress Notes (Addendum)
Inpatient Diabetes Program Recommendations  AACE/ADA: New Consensus Statement on Inpatient Glycemic Control (2015)  Target Ranges:  Prepandial:   less than 140 mg/dL      Peak postprandial:   less than 180 mg/dL (1-2 hours)      Critically ill patients:  140 - 180 mg/dL   Lab Results  Component Value Date   GLUCAP 88 11/16/2020   HGBA1C 8.1 (H) 11/09/2020    Review of Glycemic Control Results for NOLBERTO, CHEUVRONT (MRN [id number]) as of 11/16/2020 13:18  Ref. Range 11/15/2020 23:12 11/15/2020 23:43 11/16/2020 06:24 11/16/2020 11:20  Glucose-Capillary Latest Ref Range: 70 - 99 mg/dL 54 (L) 008 (H) 80 88   Current orders for Inpatient glycemic control:  Lantus 10 units  Novolog 0-9 units TID Novolog 0-5 units QHS  Inpatient Diabetes Program Recommendations:     Noted palliative is following.  Please consider: -Lantus 5 units QHS  -Novolog 0-6 units TID  Will continue to follow while inpatient.  Thank you, Dulce Sellar, RN, BSN Diabetes Coordinator Inpatient Diabetes Program [phone] (team pager from 8a-5p)

## 2020-11-17 DIAGNOSIS — F0150 Vascular dementia without behavioral disturbance: Secondary | ICD-10-CM | POA: Diagnosis not present

## 2020-11-17 DIAGNOSIS — L02416 Cutaneous abscess of left lower limb: Secondary | ICD-10-CM | POA: Diagnosis not present

## 2020-11-17 DIAGNOSIS — N179 Acute kidney failure, unspecified: Secondary | ICD-10-CM | POA: Diagnosis not present

## 2020-11-17 DIAGNOSIS — A419 Sepsis, unspecified organism: Secondary | ICD-10-CM | POA: Diagnosis not present

## 2020-11-17 DIAGNOSIS — L0291 Cutaneous abscess, unspecified: Secondary | ICD-10-CM | POA: Diagnosis not present

## 2020-11-17 DIAGNOSIS — Z515 Encounter for palliative care: Secondary | ICD-10-CM | POA: Diagnosis not present

## 2020-11-17 DIAGNOSIS — R7881 Bacteremia: Secondary | ICD-10-CM | POA: Diagnosis not present

## 2020-11-17 LAB — COMPREHENSIVE METABOLIC PANEL
ALT: <5 U/L (ref 0–44)
AST: 26 U/L (ref 15–41)
Albumin: 1.9 g/dL — ABNORMAL LOW (ref 3.5–5.0)
Alkaline Phosphatase: 166 U/L — ABNORMAL HIGH (ref 38–126)
Anion gap: 12 (ref 5–15)
BUN: 33 mg/dL — ABNORMAL HIGH (ref 8–23)
CO2: 16 mmol/L — ABNORMAL LOW (ref 22–32)
Calcium: 7.6 mg/dL — ABNORMAL LOW (ref 8.9–10.3)
Chloride: 110 mmol/L (ref 98–111)
Creatinine, Ser: 5.35 mg/dL — ABNORMAL HIGH (ref 0.61–1.24)
GFR, Estimated: 10 mL/min — ABNORMAL LOW (ref >60)
Glucose, Bld: 160 mg/dL — ABNORMAL HIGH (ref 70–99)
Potassium: 3.8 mmol/L (ref 3.5–5.1)
Sodium: 138 mmol/L (ref 135–145)
Total Bilirubin: 0.5 mg/dL (ref 0.3–1.2)
Total Protein: 6.6 g/dL (ref 6.5–8.1)

## 2020-11-17 LAB — CBC WITH DIFFERENTIAL/PLATELET
Abs Immature Granulocytes: 0.04 10*3/uL (ref 0.00–0.07)
Basophils Absolute: 0 10*3/uL (ref 0.0–0.1)
Basophils Relative: 1 %
Eosinophils Absolute: 0.3 10*3/uL (ref 0.0–0.5)
Eosinophils Relative: 4 %
HCT: 24.6 % — ABNORMAL LOW (ref 39.0–52.0)
Hemoglobin: 8.5 g/dL — ABNORMAL LOW (ref 13.0–17.0)
Immature Granulocytes: 1 %
Lymphocytes Relative: 14 %
Lymphs Abs: 1 10*3/uL (ref 0.7–4.0)
MCH: 27.3 pg (ref 26.0–34.0)
MCHC: 34.6 g/dL (ref 30.0–36.0)
MCV: 79.1 fL — ABNORMAL LOW (ref 80.0–100.0)
Monocytes Absolute: 0.5 10*3/uL (ref 0.1–1.0)
Monocytes Relative: 7 %
Neutro Abs: 4.9 10*3/uL (ref 1.7–7.7)
Neutrophils Relative %: 73 %
Platelets: 224 10*3/uL (ref 150–400)
RBC: 3.11 MIL/uL — ABNORMAL LOW (ref 4.22–5.81)
RDW: 20.8 % — ABNORMAL HIGH (ref 11.5–15.5)
WBC: 6.8 10*3/uL (ref 4.0–10.5)
nRBC: 0 % (ref 0.0–0.2)

## 2020-11-17 LAB — GLUCOSE, CAPILLARY
Glucose-Capillary: 183 mg/dL — ABNORMAL HIGH (ref 70–99)
Glucose-Capillary: 174 mg/dL — ABNORMAL HIGH (ref 70–99)
Glucose-Capillary: 131 mg/dL — ABNORMAL HIGH (ref 70–99)
Glucose-Capillary: 124 mg/dL — ABNORMAL HIGH (ref 70–99)

## 2020-11-17 LAB — PHOSPHORUS: Phosphorus: 4 mg/dL (ref 2.5–4.6)

## 2020-11-17 LAB — MAGNESIUM: Magnesium: 1.6 mg/dL — ABNORMAL LOW (ref 1.7–2.4)

## 2020-11-17 MED ORDER — MAGNESIUM SULFATE IN D5W 1-5 GM/100ML-% IV SOLN
1.0000 g | Freq: Once | INTRAVENOUS | Status: AC
  Administered 2020-11-17: 1 g via INTRAVENOUS
  Filled 2020-11-17: qty 100

## 2020-11-17 MED ORDER — DEXTROSE-NACL 5-0.45 % IV SOLN: INTRAVENOUS | Status: DC

## 2020-11-17 NOTE — Progress Notes (Signed)
[facility] NEPHROLOGY PROGRESS NOTE  Assessment/ Plan: Pt is a 79 y.o. yo male  with past medical history of stroke with residual left-sided hemiplegia, vascular dementia, hypertension, DM, dysphagia, HLD, chronic urinary retention on indwelling Foley catheter due to BPH, recent left femoral neck fracture required hemiarthroplasty in 07/2020 admitted on 12/4 from SNF for the concern of UTI, sepsis seen as a consultation for the evaluation of AKI.  #Acute kidney injury, nonoliguric: Likely dehydration with ATN.  Recurrent AKI in the past.  Chronic bladder outlet obstruction.  Baseline normal kidney function.  Cannot rule out AIN but less likely. -Poor oral intake, ongoing confusion -Improving urine output, creatinine stable -Strict ins and out, avoid hypotensive episode or nephrotoxins. -Given severe dementia, very poor functional status and multiple comorbidities, he is not a candidate for dialysis treatment.  Noted he is DNR and he was seen by palliative care team. 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  Blood culture growing MSSA and currently on cefazolin and rifampicin.  Echo without any vegetation.  For now continue antibiotics.  #Severe vascular dementia, stroke and may have some acute metabolic encephalopathy: MRI of the brain with chronic infarction and cerebral atrophy.  Continue supportive and palliative care.  #Severe protein calorie malnutrition, chronic dysphagia: Very poor oral intake per nursing staff.  Per report he declined feeding tube placement.  IV fluid for hydration.   Subjective: Confused, nonresponsive, urine output improving to 2.4 L.  Creatinine overall not changed at 5.3. Objective Vital signs in last 24 hours: Vitals:   11/16/20 1928 11/16/20 1941 11/17/20 0535 11/17/20 0813  BP: 134/72  101/85 (!) 123/59   Pulse: 80  88 83  Resp: 18  18 15   Temp:  99 F (37.2 C)  98.9 F (37.2 C)  TempSrc:  Rectal  Oral  SpO2: 100%  97% 99%  Weight:      Height:       Weight change:   Intake/Output Summary (Last 24 hours) at 11/17/2020 1137 Last data filed at 11/17/2020 0700 Gross per 24 hour  Intake 3528.71 ml  Output 2485 ml  Net 1043.71 ml       Labs: Basic Metabolic Panel: Recent Labs  Lab 11/15/20 0238 11/16/20 0213 11/17/20 0232  NA 147* 145 138  K 4.2 3.2* 3.8  CL 118* 116* 110  CO2 19* 17* 16*  GLUCOSE 129* 79 160*  BUN 36* 34* 33*  CREATININE 5.67* 5.54* 5.35*  CALCIUM 7.3* 7.6* 7.6*  PHOS 4.2 3.8 4.0   Liver Function Tests: Recent Labs  Lab 11/15/20 0238 11/16/20 0213 11/17/20 0232  AST 18 16 26   ALT <5 <5 <5  ALKPHOS 194* 137* 166*  BILITOT 0.4 0.2* 0.5  PROT 7.2 6.9 6.6  ALBUMIN 1.1* 2.7* 1.9*   No results for input(s): LIPASE, AMYLASE in the last 168 hours. No results for input(s): AMMONIA in the last 168 hours. CBC: Recent Labs  Lab 11/11/20 0252 11/13/20 1622 11/14/20 0832 11/15/20 0238 11/17/20 0737  WBC 9.7 9.9 7.9 7.0 6.8  NEUTROABS 6.4  --  5.5 4.9 4.9  HGB 9.3* 9.2* 8.1* 9.1* 8.5*  HCT 27.8* 27.9* 23.2* 27.7* 24.6*  MCV 80.1 81.6 78.9* 81.2 79.1*  PLT 400 320 343 294 224   Cardiac Enzymes: No results for input(s): CKTOTAL, CKMB, CKMBINDEX, TROPONINI in the last 168 hours. CBG: Recent  Labs  Lab 11/16/20 1120 11/16/20 1724 11/16/20 1941 11/16/20 2204 11/17/20 0801  GLUCAP 88 98 74 83 183*    Iron Studies: No results for input(s): IRON, TIBC, TRANSFERRIN, FERRITIN in the last 72 hours. Studies/Results: No results found.  Medications: Infusions: . sodium chloride 125 mL/hr at 11/17/20 0430  . sodium chloride 250 mL (11/01/20 2145)  .  ceFAZolin (ANCEF) IV 1 g (11/17/20 0556)  . dextrose 75 mL/hr at 11/17/20 0817  . magnesium sulfate bolus IVPB      Scheduled Medications: . brimonidine  1 drop Both Eyes TID  .  Chlorhexidine Gluconate Cloth  6 each Topical Daily  . diltiazem  60 mg Oral Q8H  . feeding supplement (GLUCERNA SHAKE)  237 mL Oral TID PC & HS  . heparin injection (subcutaneous)  5,000 Units Subcutaneous Q8H  . insulin aspart  0-5 Units Subcutaneous QHS  . insulin aspart  0-9 Units Subcutaneous TID WC  . insulin glargine  10 Units Subcutaneous QHS  . latanoprost  1 drop Both Eyes QHS  . metoprolol tartrate  50 mg Oral BID  . metoprolol tartrate  5 mg Intravenous Once  . mirtazapine  7.5 mg Oral QHS  . multivitamin with minerals  1 tablet Oral Daily  . mupirocin ointment   Topical BID  . pantoprazole sodium  40 mg Oral Daily  . rifampin  300 mg Oral Q12H  . sodium chloride flush  5 mL Intracatheter Q8H    have reviewed scheduled and prn medications.  Physical Exam: General:  chronically ill-appearing, lying in bed, no distress Heart: Normal rate, no audible murmurs Lungs: Bilateral chest rise, no increased work of breathing Abdomen:soft, Non-tender, non-distended Extremities:No LE edema Neurology: awake and alert but not responsive  Bernestine Amass Gwendola Hornaday 11/17/2020,11:37 AM  LOS: 27 days

## 2020-11-17 NOTE — Progress Notes (Signed)
Daily Progress Note   Patient Name: Marcus Downs.       Date: 11/17/2020 DOB: 1940-12-18  Age: 79 y.o. MRN#: [id number] Attending Physician: Merlene Laughter, DO Primary Care Physician: [name], MD Admit Date: 2020/10/22  Reason for Consultation/Follow-up: To discuss complex medical decision making related to patient's goals of care.  Not eating, barely drinking, and spitting out meds.  IVF are going at 125 ml/hour with no improvement in BUN/creatinine.  Subjective: Patient responds to me but is more confused today than earlier in the week.  His words are unintelligible.  He is showing decline. I left a voice mail for son, [name] (primary contact).  Gave a brief statement that he is more confused and not improving on artificial hydration.  Requested call back.  I'm sad for both Mr. Takagi and his very nice family.   Assessment: Patient with continued decline despite artificial hydration and feeding encouragement.   Patient Profile/HPI:  79 y.o.malewith past medical history of stroke, vascular dementia, hypertension, hyperlipidemia, diabetes, GERD, dysphagia, chronic urinary retention with indwelling catheter due to BPH, L hemiarthroplasty 09/2021admitted on December 05, 2021from SNFwithUTI and sepsis.CT A/P found left hip abscess with MSSA bacteremia. Overall prognosis poor. Palliative care requested to continue goals of care conversations with family.  Length of Stay: 27   Vital Signs: BP (!) 123/59 (BP Location: Right Arm)   Pulse 83   Temp 98.9 F (37.2 C) (Oral)   Resp 15   Ht 6' (1.829 m)   Wt 70.5 kg   SpO2 99%   BMI 21.08 kg/m  SpO2: SpO2: 99 % O2 Device: O2 Device: Room Air O2 Flow Rate:         Palliative Assessment/Data: 20%     Palliative Care  Plan    Recommendations/Plan:  Continue current treatment.  Renal failure not improving despite hydration.  Continue to reach out to family.  I suspect that they plan to revisit the after the 1st of the year.  Code Status:  DNR  Prognosis:   < 2 weeks with out IV hydration his prognosis is days to less than 2 weeks.   He is appropriate for a hospice facility.  Discharge Planning:  To Be Determined    Thank you for allowing the Palliative Medicine Team to assist in the care of this  patient.  Total time spent:  25 min.     Greater than 50%  of this time was spent counseling and coordinating care related to the above assessment and plan.  Norvel Richards, PA-C Palliative Medicine  Please contact Palliative MedicineTeam phone at [phone] for questions and concerns between 7 am - 7 pm.   Please see AMION for individual provider pager numbers.

## 2020-11-17 NOTE — Progress Notes (Signed)
Marcus Downs.  KPT:[id number] DOB: June 09, 1941 DOA: 10/24/2020 PCP: Ria Bush, MD   Brief Narrative:  Patient is a 79 year old African-American male with a past medical history significant for but not limited to vascular dementia, previous CVA with residual left-sided hemiplegia, essential hypertension, type 2 diabetes mellitus, dysphagia and hyperlipidemia as well as chronic urine retention with indwelling Foley catheter secondary to BPH and recent left femoral neck fracture status post left hemiarthroplasty on 08/08/2020 who presented from his nursing facility with altered mental status, agitation and acute kidney injury with electrolyte abnormalities with hyponatremia.  He was in septic shock and found to have an abscess around his arthroplasty hardware and an MSSA bacteremia and was deemed not a surgical candidate.  He was empirically treated with IV antibiotics for his MSSA infection and ended up having a left hip drain. He was transferred back to [facility] service 10/24/20. Overall his prognosis and clinical status remained poor and my colleagues have had multiple conversations with family about palliative care or hospice and family not ready yet.  On 11/08/2020 is pain to tube was removed and he was eating about 50% of his meals but this is not been adequately documented.  We will ask for a calorie count to see if the patient actually nutrition orally.  If he does not he may be hospice appropriate.  Palliative care following appreciate continue goals of care discussion.   Interim History Intermittently he has not been awake enough to participate in Examination and so ? How much he is really taking in orally. IR evaluated and patient's left hip abscess drain is intact and there can continue to follow and recommending a repeat CT scan when his output is minimal and less than 10 cc daily.   On 11/09/2020 patient had better intake with his Magic cup and will start obstruction.   Calorie count was extended for another day.  Lantus was added for the patient on 11/10/2020.  On day 3 of his calorie count patient continued to do well with Magic cup with excellent consumption even though meal consumption was poor.  Patient blood sugars were in 300 so they continue with same as opposed to more nutritionally dense Ensure supplements.  Nutritionist recommended trialing Nucynta a fourth time to help patient meet his calorie count and protein needs.  Calorie count was continued throughout the weekend and per facility he needed to eat at least 90% of his meals prior to discharge but unfortunately no meal documentation was obtained over the weekend and nutritionist was unable to provide any additional information regarding patient's p.o consumption.Marland Kitchen  He did not want another PEG tube Dobbhoff.  In the interim patient intermittent acute kidney injury and worsening renal status.  He was trialed on mirtazapine for appetite stimulant.  Family wants to continue IV antibiotic course for now.  On 12/25 2020 when he had no significant p.o. intake and was not even alert enough to take his medication.  He was started on <5 MLS per hour since his poor p.o. intake.  After further goals of care discussion with palliative care medicine patient was changed to DO NOT RESUSCITATE.  Palliative continues to meet with the patient and the family about goals of care.  Nephrology was consulted given his AKI and they feel it is in the setting of severe dehydration with concomitant hemodynamic changes/hypotension.  Dr. Carolin Sicks is unable to rule out and related antibiotic use.  We have repeated a urinalysis, order bladder  scan and renal ultrasound which has ruled out obstruction.  Patient was given a 500 mL bolus and started on normal saline at 125 MLS per hour.  He is not a candidate for dialysis treatment per nephrology recommendations  11/15/2020: Patient has some facial twitching likely in the setting of his uremia.   Continues to have severe dehydration and has been predisposed to his AKI because of his prior AKI.  They are recommending continuing IV fluid hydration given that they think he still dry and because he has no oral intake.  They also ordered him some albumin IV and recommending strict I's and O's and avoid hypotensive episodes.  His functional status is very poor and he is not a dialysis candidate.  They are recommending continuing half-normal saline at 125 mils per hour and patient is also getting D5W at 75 MLS per hour.  11/16/2020: Patient did not eat any of his meal today and has been off all his medications.  Any continued goals of care discussion and nephrology is continuing his fluids for now given his severe dehydration.  11/17/2020: Not taking in much p.o. intake and keeps refusing his medications.  Palliative care involvement call the patient's son and they did not answer.  Patient's prognosis is extremely poor and he is hospice appropriate and I feel that he will not continue to do well.  We will need to continue addressing goals of care discussion given that he is not eating anything or drink anything.  Renal function is minimally improved from yesterday and nephrology has changed some IV fluids around again and only to D5 half-normal saline at 75 mL's per hour.  Assessment & Plan:   Principal Problem:   MSSA bacteremia Active Problems:   Protein-calorie malnutrition, severe (HCC)   Encounter for nasogastric (NG) tube placement   Severe sepsis (HCC)   Abscess of left hip   Hypernatremia   Abscess   Acute kidney injury (nontraumatic) (HCC)   Dehydration   Elevated liver function tests   Prosthetic hip infection (HCC)   Hyperkalemia   MSSA bacteremia/sepsis secondary to MSSA left hip abscess with septic shock: Prosthetic hip infection. poA Catheter Associated UTI -Treated with vasopressors and weaned off. -Blood cultures 12/4, MSSA bacteremia -Hip abscess 12/4, MSSA -Urine  culture 12/4, MSSA -Repeat blood cultures negative.  Echocardiogram without any vegetation.   -Not a good candidate for operative removal/spacer or multistage surgery. -IR guided abscess drain, continues to drain.  Overall with poor clinical improvement. -Currently remains on cefazolin and rifampicin.  Total 6 weeks of antibiotics planned. -Antibiotic end date 12/03/2020. -IR evaluated and recommending repeating CT scanning when his output from his drain is minimal less than 10 cc/day -Oral intake has been extremely poor throughout the weekend and has declined his NG tube. -Plan was to transfer to nursing home in next 24 to 48 hours if he is able to eat at least 50% of his meals but calorie count is inaccurate and will need to ensure that patient is adequately meeting his needs; if he is not tolerating p.o. left need to reevaluate goals of care discussion and he may be hospice appropriate as NG tube will not be replaced and PEG tube is not desired. -Patient has been doing good with his listener shakes p.o. 4 times daily and Magic of 3 times daily with meals and multivitamins however calorie count has not been discontinued given lack of nursing documentation -He has been started back on IV fluids and nephrology been consulted  for AKI -Palliative care is involved in goals of care discussion and after discussion family wants to continue IV antibiotics at SNF with palliative however if kidney function continues to worsen of care will be recommending hospice house. -Patient has a very high risk of further decompensation and decline his prognosis is extremely poor given that he is not eating or drinking.  He is hospice appropriate however will need to continue goals of care discussion and palliative care has reached out to the patient's family and they did not answer today and the palliative team suspects that the family will plan to revisit after the first of the year.  SVT and Sinus  Tachycardia -Improving.  Treated with Cardizem.   -Now on Cardizem 60 mg po q8h and Metoprolol Tartrate 50 mg po BID and IV Metoprolol 2.5 IV q6hprn for HR>120 Sustained -Currently HR acceptable ranging from from 89-107 -TSH 1.6.  Acute metabolic encephalopathy in the setting of history of stroke and underlying vascular dementia -He has not made very much improvement in order course of his hospitalization and was somnolent today -Repeat MRI with no evidence of acute process.  Does have history of vascular dementia.   -Followed by palliative.  -Hospice recommended, family declined and could not make decisions. 12/20, patient started eating some but was not awake enough to take anything orally today.  Dobbhoff tube discontinued. -Initially he has had some improvement in mental status, however overall remains in very debilitated state; number to interact with me today but I cannot understand what he is saying he is very confused.  His albumin was 1.1 today -Calorie count has  Now been discontinued given an adequate documentation by nursing.  Patient is not eating or drinking anything the last few days.  He is more hospice appropriate but will need to continue to have goals of care discussion.  In the interim we will continue with IV fluid hydration with D5 half-normal saline per nephrology recommendations  Hypocalcemia -Patient's calcium was 7.6 -Albumin was 1.9 -Replete with IV calcium gluconate 1 g again -Corrected Calcium is 9.3 -We will continue to monitor and trend and repeat CMP in a.m.  Thrombocytosis -Likely reactive  -Patient's platelet count is improved and now 224 -Continue Monitor and Trend -Repeat CBC in the AM  Hypernatremia -In the setting of dehydration from poor oral intake -Patient's sodium is now 138 -Continue with fluid hydration as well per nephrology -Continue to monitor and trend and repeat CMP in a.m.  Microcytic Anemia -Patient's Hgb/Hct is relatively stable  a 8.5/24.6 -Checked anemia panel showed iron level of 25, U IBC 108, TIBC 133, saturation of 90%, ferritin of 559, folate level of 7.9, vitamin B12 332 -Continue to Monitor for S/Sx of Bleeding; Currently no overt bleeding noted -Repeat CBC in the AM  GERD/GI Prophylaxis -C/w Pantoprazole 40 mg po Daily   Hypomagnesemia -Mag Level was 1.6 -Replete with IV Mag Sulfate 1 mg -Continue to Monitor and Replete as Necessary -Repeat Mag Level in the AM   Acute Kidney Injury with Hypernatremia, worsened  Metabolic acidosis -Sodium level improved with increasing free water flush.   -Renal function and sodium have both normalized but significantly elevated yesterday -Patient's sodium this morning was 147 and patient's BUNs/creatinine has gone from 11/1.00 on the 23rd December and has significantly worsened and 36/5.67 yesterday when it peaked and is now slowly coming down at 34/5.54 today and today it is 33/5.35 -will obtain a urinalysis, urine culture, bladder scan, urine electrolytes as well  as renal ultrasound -Nephrology was consulted for further evaluation and have made further adjustments to the fluids and patient is now on D5 half-normal saline at 75 mils per hour -patient now has a CO2 of 16, anion gap of 12, chloride level of 110 -Avoid nephrotoxic medications, contrast dyes, hypotension and renally dose medications -Continue to monitor and trend and repeat CMP in a.m.  Type 2 Diabetes, uncontrolled with Hyperglycemia -> Hypoglycemia -HbA1c 8.1.  -On 12/20, hypoglycemic episodes after stopping tube feeding and continuing on long-acting insulin. -Decrease dose of long-acting insulin, sliding scale insulin to sensitive scale. He is now on Sensitive Novolog SSI AC/HS -If he is not eating well, he will not need that much insulin and is now on 10 units of Lantus subcu daily -CBGs ranging from 74-183; Continue to monitor and adjust blood sugars as necessary  Dysphagia -Remained with poor  mentation again today.  Initially was eating with help but his oral intake is not being properly documented.   -He does not want a PEG tube  -No reinsertion of Dobbhoff tube recommended.   -We can see next few days and continue to have palliative discussion and if he is not improving may be hospice appropriate  Pressure Injury Pressure Injury 08/07/20 Buttocks Right;Medial;Left Stage 2 -  Partial thickness loss of dermis presenting as a shallow open injury with a red, pink wound bed without slough. (Active)  08/07/20 1914  Location: Buttocks  Location Orientation: Right;Medial;Left  Staging: Stage 2 -  Partial thickness loss of dermis presenting as a shallow open injury with a red, pink wound bed without slough.  Wound Description (Comments):   Present on Admission: Yes     Pressure Injury 11/02/20 Penis (Active)  11/02/20 0344  Location: Penis (distal meatus)  Location Orientation:   Staging:   Wound Description (Comments):   Present on Admission:    Severe Malnutrition in the Context of Chronic Illness -Estimated body mass index is 21.08 kg/m as calculated from the following:   Height as of this encounter: 6' (1.829 m).   Weight as of this encounter: 70.5 kg.  -Nutrition Problem: Severe Malnutrition -Etiology: chronic illness (Dementia) -Signs/Symptoms: severe fat depletion,severe muscle depletion,percent weight loss (15.4% weight loss in 9 months) -Percent weight loss: 15.4 % (less than 9 months) -Interventions: Tube feeding,Prostat,MVI; TF now discontinued and Panda Tube Removed -Nutritionist consulted for further evaluation and recommendations -C/w 48 hour Calorie Count but she has not been keeping track of his meals appropriately so we will need to extend this out given the very limited no documentation abdomen completed by nursing staff.  Nutrition was stressing the importance of keeping accurate documentation and it is unclear if the patient is consuming his supplements well.   Will need to make an informed decision of whether he is able to adequately meet his nutritional needs orally we will need to have continued goals of care discussion and possible transfer to hospice if he does not -Nutritionist recommending c/w Glucerna Shake po TID, Magic Cup TIDwm, and MVI Daily; stressed importance of obtaining a calorie count -Patient is not eating very much and documentation of the calorie count has now stopped. -We will need to continue goals of care discussion given he continues to not eat very well over the last few days and has not been taking anything orally including his supplements for his medications.  DVT prophylaxis: Heparin 5,000 units sq q8h Code Status: DO NOT RESUSCITATE  Family Communication: No family present at bedside  Disposition  Plan: Anticipating D/C to SNF if able to tolerate po Intake adequately vs Hospice Appropriate; will need continued goals of care discussion as he is not eating significantly and will have continued palliative care discussions and palliative care call the patient's family to discuss the patient's prognosis and they did not answer.  Status is: Inpatient  Remains inpatient appropriate because:Unsafe d/c plan, IV treatments appropriate due to intensity of illness or inability to take PO and Inpatient level of care appropriate due to severity of illness   Dispo:  Patient From:  Home  Planned Disposition:  SNF  Expected discharge date: 11/17/2020  Medically stable for discharge:  No  Consultants:   Palliative Care  Infectious Diseases  Interventional Radiology  PCCM Transfer  Orthopedic Surgery  Nephrology   Procedures:  Left hip abscess s/p abscess drain to the left thigh on 11/03/2020  Antimicrobials: Anti-infectives (From admission, onward)   Start     Dose/Rate Route Frequency Ordered Stop   11/14/20 0300  ceFAZolin (ANCEF) IVPB 1 g/50 mL premix        1 g 100 mL/hr over 30 Minutes Intravenous Every 12 hours  11/13/20 1952     11/08/20 2200  rifampin (RIFADIN) capsule 300 mg  Status:  Discontinued        300 mg Oral Every 12 hours 11/08/20 1319 11/08/20 1322   11/08/20 2200  rifampin (RIFADIN) 25 mg/mL oral suspension 300 mg        300 mg Oral Every 12 hours 11/08/20 1322     11/06/20 2200  rifampin (RIFADIN) 25 mg/mL oral suspension 300 mg  Status:  Discontinued        300 mg Per Tube Every 12 hours 11/06/20 1008 11/08/20 1319   10/26/20 1100  rifampin (RIFADIN) 300 mg in sodium chloride 0.9 % 100 mL IVPB  Status:  Discontinued        300 mg 200 mL/hr over 30 Minutes Intravenous Every 12 hours 10/26/20 1007 11/06/20 1008   10/24/20 0815  ceFAZolin (ANCEF) IVPB 2g/100 mL premix  Status:  Discontinued        2 g 200 mL/hr over 30 Minutes Intravenous Every 8 hours 10/24/20 0804 11/13/20 1952   10/23/20 2200  ceFAZolin (ANCEF) IVPB 2g/100 mL premix  Status:  Discontinued        2 g 200 mL/hr over 30 Minutes Intravenous Every 12 hours 10/23/20 1406 10/24/20 0804   10/22/20 1000  ceFAZolin (ANCEF) IVPB 1 g/50 mL premix  Status:  Discontinued        1 g 100 mL/hr over 30 Minutes Intravenous Every 12 hours 2020/07/29 1745 10/23/20 1406   2020/07/29 2200  ceFAZolin (ANCEF) IVPB 2g/100 mL premix        2 g 200 mL/hr over 30 Minutes Intravenous  Once 2020/07/29 1739 2020/07/29 2239   2020/07/29 2100  cefTRIAXone (ROCEPHIN) 2 g in sodium chloride 0.9 % 100 mL IVPB  Status:  Discontinued        2 g 200 mL/hr over 30 Minutes Intravenous Every 24 hours 2020/07/29 1117 2020/07/29 1129   2020/07/29 1730  linezolid (ZYVOX) IVPB 600 mg  Status:  Discontinued        600 mg 300 mL/hr over 60 Minutes Intravenous Every 12 hours 2020/07/29 1154 2020/07/29 1739   2020/07/29 1130  cefTRIAXone (ROCEPHIN) 2 g in sodium chloride 0.9 % 100 mL IVPB  Status:  Discontinued        2 g 200 mL/hr over 30 Minutes Intravenous Every  24 hours 10/19/2020 1129 10/24/2020 1739   11/16/2020 1000  meropenem (MERREM) 500 mg in sodium chloride 0.9 % 100 mL IVPB   Status:  Discontinued        500 mg 200 mL/hr over 30 Minutes Intravenous Every 12 hours 11/15/2020 0330 11/12/2020 1117   11/17/2020 0400  linezolid (ZYVOX) IVPB 600 mg        600 mg 300 mL/hr over 60 Minutes Intravenous  Once 11/14/2020 0309 10/24/2020 0654   11/06/2020 0215  ceFEPIme (MAXIPIME) 2 g in sodium chloride 0.9 % 100 mL IVPB        2 g 200 mL/hr over 30 Minutes Intravenous  Once 10/24/2020 0212 11/05/2020 0306   11/13/2020 0215  ampicillin-sulbactam (UNASYN) 1.5 g in sodium chloride 0.9 % 100 mL IVPB  Status:  Discontinued        1.5 g 200 mL/hr over 30 Minutes Intravenous  Once 11/09/2020 0212 10/28/2020 0214   11/16/2020 0215  vancomycin (VANCOCIN) IVPB 1000 mg/200 mL premix  Status:  Discontinued        1,000 mg 200 mL/hr over 60 Minutes Intravenous  Once 11/08/2020 0981 11/03/2020 0314        Subjective: Seen and examined at bedside and remains somnolent today and not very interactive.  Nursing states that he cannot take anything by mouth eating or drinking and refusing medications, supplements as well as medications.  Nursing also states that the sacral wound is also not looking as good.  No family currently at bedside and no acute events overnight noted.  Objective: Vitals:   11/16/20 1941 11/17/20 0535 11/17/20 0813 11/17/20 1435  BP:  101/85 (!) 123/59 (!) 125/91  Pulse:  88 83 92  Resp:  Temp: 99 F (37.2 C)  98.9 F (37.2 C)   TempSrc: Rectal  Oral   SpO2:  97% 99%   Weight:      Height:        Intake/Output Summary (Last 24 hours) at 11/17/2020 1544 Last data filed at 11/17/2020 1512 Gross per 24 hour  Intake 4661.23 ml  Output 1635 ml  Net 3026.23 ml   Filed Weights   11/02/20 0825 11/05/20 0500 11/09/20 0451  Weight: 71.5 kg 72.8 kg 70.5 kg   Examination: Physical Exam:  Constitutional: The patient is a very thin chronically ill-appearing African-American male who appears in no acute distress and is more somnolent and not responsive. Eyes: Lids and  conjunctivae normal, sclerae anicteric  ENMT: External Ears, Nose appear normal. Grossly normal hearing. Mucous membranes are still slightly dry Neck: Appears normal, supple, no cervical masses, normal ROM, no appreciable thyromegaly; no appreciable JVD Respiratory: Diminished to auscultation bilaterally with coarse breath sounds, no wheezing, rales, rhonchi or crackles. Normal respiratory effort and patient is not tachypenic. No accessory muscle use.  Unlabored breathing Cardiovascular: RRR, no murmurs / rubs / gallops. S1 and S2 auscultated.  No appreciable extremity edema. Abdomen: Soft, non-tender, non-distended. Bowel sounds positive.  GU: Deferred.  Foley catheter is in place Musculoskeletal: No clubbing / cyanosis of digits/nails.  Has a drain in the left hip Skin: No rashes, lesions, ulcers. No induration; Warm and dry.  Neurologic: Patient is somnolent and does not follow commands..  Psychiatric: Continues to have impaired judgment and insight.  Somnolent and drowsy.  Data Reviewed: I have personally reviewed following labs and imaging studies  CBC: Recent Labs  Lab 11/11/20 0252 11/13/20 1622 11/14/20 0832 11/15/20 0238 11/17/20 0737  WBC 9.7 9.9  7.9 7.0 6.8  NEUTROABS 6.4  --  5.5 4.9 4.9  HGB 9.3* 9.2* 8.1* 9.1* 8.5*  HCT 27.8* 27.9* 23.2* 27.7* 24.6*  MCV 80.1 81.6 78.9* 81.2 79.1*  PLT 400 320 343 294 224   Basic Metabolic Panel: Recent Labs  Lab 11/13/20 1622 11/13/20 1805 11/14/20 0334 11/14/20 0832 11/15/20 0238 11/16/20 0213 11/17/20 0232  NA 146*  --  147*  --  147* 145 138  K 3.9  --  3.3*  --  4.2 3.2* 3.8  CL 112*  --  117*  --  118* 116* 110  CO2 22  --  20*  --  19* 17* 16*  GLUCOSE 251*  --  179*  --  129* 79 160*  BUN 34*  --  35*  --  36* 34* 33*  CREATININE 4.79* 5.07* 5.34*  --  5.67* 5.54* 5.35*  CALCIUM 7.5*  --  7.6*  --  7.3* 7.6* 7.6*  MG  --   --   --  2.2 2.0 1.9 1.6*  PHOS  --   --   --  4.4 4.2 3.8 4.0   GFR: Estimated  Creatinine Clearance: 11.3 mL/min (A) (by C-G formula based on SCr of 5.35 mg/dL (H)). Liver Function Tests: Recent Labs  Lab 11/13/20 1622 11/14/20 0832 11/15/20 0238 11/16/20 0213 11/17/20 0232  AST ALT 6 <5 <5 <5 <5  ALKPHOS 228* 202* 194* 137* 166*  BILITOT 0.5 0.3 0.4 0.2* 0.5  PROT 7.4 7.2 7.2 6.9 6.6  ALBUMIN 1.1* 1.0* 1.1* 2.7* 1.9*   No results for input(s): LIPASE, AMYLASE in the last 168 hours. No results for input(s): AMMONIA in the last 168 hours. Coagulation Profile: No results for input(s): INR, PROTIME in the last 168 hours. Cardiac Enzymes: No results for input(s): CKTOTAL, CKMB, CKMBINDEX, TROPONINI in the last 168 hours. BNP (last 3 results) No results for input(s): PROBNP in the last 8760 hours. HbA1C: No results for input(s): HGBA1C in the last 72 hours. CBG: Recent Labs  Lab 11/16/20 1724 11/16/20 1941 11/16/20 2204 11/17/20 0801 11/17/20 1158  GLUCAP 98 74 83 183* 174*   Lipid Profile: No results for input(s): CHOL, HDL, LDLCALC, TRIG, CHOLHDL, LDLDIRECT in the last 72 hours. Thyroid Function Tests: No results for input(s): TSH, T4TOTAL, FREET4, T3FREE, THYROIDAB in the last 72 hours. Anemia Panel: No results for input(s): VITAMINB12, FOLATE, FERRITIN, TIBC, IRON, RETICCTPCT in the last 72 hours. Sepsis Labs: No results for input(s): PROCALCITON, LATICACIDVEN in the last 168 hours.  Recent Results (from the past 240 hour(s))  Resp Panel by RT-PCR (Flu A&B, Covid) Nasopharyngeal Swab     Status: None   Collection Time: 11/10/20  3:16 AM   Specimen: Nasopharyngeal Swab; Nasopharyngeal(NP) swabs in vial transport medium  Result Value Ref Range Status   SARS Coronavirus 2 by RT PCR NEGATIVE NEGATIVE Final    Comment: (NOTE) SARS-CoV-2 target nucleic acids are NOT DETECTED.  The SARS-CoV-2 RNA is generally detectable in upper respiratory specimens during the acute phase of infection. The lowest concentration of SARS-CoV-2 viral  copies this assay can detect is 138 copies/mL. A negative result does not preclude SARS-Cov-2 infection and should not be used as the sole basis for treatment or other patient management decisions. A negative result may occur with  improper specimen collection/handling, submission of specimen other than nasopharyngeal swab, presence of viral mutation(s) within the areas targeted by this assay, and inadequate number of viral copies(<138  copies/mL). A negative result must be combined with clinical observations, patient history, and epidemiological information. The expected result is Negative.  Fact Sheet for Patients:  BloggerCourse.com  Fact Sheet for Healthcare Providers:  SeriousBroker.it  This test is no t yet approved or cleared by the [place] FDA and  has been authorized for detection and/or diagnosis of SARS-CoV-2 by FDA under an Emergency Use Authorization (EUA). This EUA will remain  in effect (meaning this test can be used) for the duration of the COVID-19 declaration under Section 564(b)(1) of the Act, 21 U.S.C.section 360bbb-3(b)(1), unless the authorization is terminated  or revoked sooner.       Influenza A by PCR NEGATIVE NEGATIVE Final   Influenza B by PCR NEGATIVE NEGATIVE Final    Comment: (NOTE) The Xpert Xpress SARS-CoV-2/FLU/RSV plus assay is intended as an aid in the diagnosis of influenza from Nasopharyngeal swab specimens and should not be used as a sole basis for treatment. Nasal washings and aspirates are unacceptable for Xpert Xpress SARS-CoV-2/FLU/RSV testing.  Fact Sheet for Patients: BloggerCourse.com  Fact Sheet for Healthcare Providers: SeriousBroker.it  This test is not yet approved or cleared by the [place] FDA and has been authorized for detection and/or diagnosis of SARS-CoV-2 by FDA under an Emergency Use Authorization (EUA). This  EUA will remain in effect (meaning this test can be used) for the duration of the COVID-19 declaration under Section 564(b)(1) of the Act, 21 U.S.C. section 360bbb-3(b)(1), unless the authorization is terminated or revoked.  Performed at [facility], [address] [place], [place] [address]   Culture, Urine     Status: Abnormal   Collection Time: 11/14/20 10:37 AM   Specimen: Urine, Random  Result Value Ref Range Status   Specimen Description URINE, RANDOM  Final   Special Requests   Final    NONE Performed at [facility], [address] [place], [place] [address]    Culture >=100,000 COLONIES/mL YEAST (A)  Final   Report Status 11/15/2020 FINAL  Final     RN Pressure Injury Documentation: Pressure Injury 08/07/20 Buttocks Right;Medial;Left Stage 2 -  Partial thickness loss of dermis presenting as a shallow open injury with a red, pink wound bed without slough. (Active)  08/07/20 1914  Location: Buttocks  Location Orientation: Right;Medial;Left  Staging: Stage 2 -  Partial thickness loss of dermis presenting as a shallow open injury with a red, pink wound bed without slough.  Wound Description (Comments):   Present on Admission: Yes     Pressure Injury 11/02/20 Penis (Active)  11/02/20 0344  Location: Penis  Location Orientation:   Staging:   Wound Description (Comments):   Present on Admission:      Estimated body mass index is 21.08 kg/m as calculated from the following:   Height as of this encounter: 6' (1.829 m).   Weight as of this encounter: 70.5 kg.  Malnutrition Type:  Nutrition Problem: Severe Malnutrition Etiology: chronic illness (dementia)   Malnutrition Characteristics:  Signs/Symptoms: severe fat depletion,severe muscle depletion,percent weight loss (15.4% weight loss in 9 months) Percent weight loss: 15.4 % (less than 9 months)   Nutrition Interventions:  Interventions: Glucerna shake,MVI,Magic cup,Calorie Count     Radiology Studies: No results found.  Scheduled Meds: . brimonidine  1 drop Both Eyes TID  . Chlorhexidine Gluconate Cloth  6 each Topical Daily  . diltiazem  60 mg Oral Q8H  . feeding supplement (GLUCERNA SHAKE)  237 mL Oral TID PC &  HS  . heparin injection (subcutaneous)  5,000 Units Subcutaneous Q8H  . insulin aspart  0-5 Units Subcutaneous QHS  . insulin aspart  0-9 Units Subcutaneous TID WC  . insulin glargine  10 Units Subcutaneous QHS  . latanoprost  1 drop Both Eyes QHS  . metoprolol tartrate  50 mg Oral BID  . metoprolol tartrate  5 mg Intravenous Once  . mirtazapine  7.5 mg Oral QHS  . multivitamin with minerals  1 tablet Oral Daily  . mupirocin ointment   Topical BID  . pantoprazole sodium  40 mg Oral Daily  . rifampin  300 mg Oral Q12H  . sodium chloride flush  5 mL Intracatheter Q8H   Continuous Infusions: . sodium chloride 250 mL (11/01/20 2145)  .  ceFAZolin (ANCEF) IV 1 g (11/17/20 1500)  . dextrose 5 % and 0.45% NaCl 75 mL/hr at 11/17/20 1234    LOS: 27 days   Merlene Laughter, DO [facility] PAG[facility] is on AMION  If 7PM-7AM, please contact night-coverage [email or web address]

## 2020-11-18 DIAGNOSIS — A419 Sepsis, unspecified organism: Secondary | ICD-10-CM | POA: Diagnosis not present

## 2020-11-18 DIAGNOSIS — L0291 Cutaneous abscess, unspecified: Secondary | ICD-10-CM | POA: Diagnosis not present

## 2020-11-18 DIAGNOSIS — R7881 Bacteremia: Secondary | ICD-10-CM | POA: Diagnosis not present

## 2020-11-18 DIAGNOSIS — L02416 Cutaneous abscess of left lower limb: Secondary | ICD-10-CM | POA: Diagnosis not present

## 2020-11-18 LAB — COMPREHENSIVE METABOLIC PANEL
ALT: 5 U/L (ref 0–44)
AST: 24 U/L (ref 15–41)
Albumin: 1.8 g/dL — ABNORMAL LOW (ref 3.5–5.0)
Alkaline Phosphatase: 166 U/L — ABNORMAL HIGH (ref 38–126)
Anion gap: 9 (ref 5–15)
BUN: 31 mg/dL — ABNORMAL HIGH (ref 8–23)
CO2: 18 mmol/L — ABNORMAL LOW (ref 22–32)
Calcium: 7.6 mg/dL — ABNORMAL LOW (ref 8.9–10.3)
Chloride: 114 mmol/L — ABNORMAL HIGH (ref 98–111)
Creatinine, Ser: 5.11 mg/dL — ABNORMAL HIGH (ref 0.61–1.24)
GFR, Estimated: 11 mL/min — ABNORMAL LOW (ref >60)
Glucose, Bld: 127 mg/dL — ABNORMAL HIGH (ref 70–99)
Potassium: 3.5 mmol/L (ref 3.5–5.1)
Sodium: 141 mmol/L (ref 135–145)
Total Bilirubin: 0.3 mg/dL (ref 0.3–1.2)
Total Protein: 6.7 g/dL (ref 6.5–8.1)

## 2020-11-18 LAB — CBC WITH DIFFERENTIAL/PLATELET
Abs Immature Granulocytes: 0.02 10*3/uL (ref 0.00–0.07)
Basophils Absolute: 0.1 10*3/uL (ref 0.0–0.1)
Basophils Relative: 1 %
Eosinophils Absolute: 0.3 10*3/uL (ref 0.0–0.5)
Eosinophils Relative: 4 %
HCT: 27.4 % — ABNORMAL LOW (ref 39.0–52.0)
Hemoglobin: 9.3 g/dL — ABNORMAL LOW (ref 13.0–17.0)
Immature Granulocytes: 0 %
Lymphocytes Relative: 16 %
Lymphs Abs: 1.1 10*3/uL (ref 0.7–4.0)
MCH: 26.6 pg (ref 26.0–34.0)
MCHC: 33.9 g/dL (ref 30.0–36.0)
MCV: 78.5 fL — ABNORMAL LOW (ref 80.0–100.0)
Monocytes Absolute: 0.4 10*3/uL (ref 0.1–1.0)
Monocytes Relative: 6 %
Neutro Abs: 5 10*3/uL (ref 1.7–7.7)
Neutrophils Relative %: 73 %
Platelets: 236 10*3/uL (ref 150–400)
RBC: 3.49 MIL/uL — ABNORMAL LOW (ref 4.22–5.81)
RDW: 20.5 % — ABNORMAL HIGH (ref 11.5–15.5)
WBC: 7 10*3/uL (ref 4.0–10.5)
nRBC: 0 % (ref 0.0–0.2)

## 2020-11-18 LAB — GLUCOSE, CAPILLARY
Glucose-Capillary: 99 mg/dL (ref 70–99)
Glucose-Capillary: 110 mg/dL — ABNORMAL HIGH (ref 70–99)
Glucose-Capillary: 130 mg/dL — ABNORMAL HIGH (ref 70–99)
Glucose-Capillary: 148 mg/dL — ABNORMAL HIGH (ref 70–99)

## 2020-11-18 LAB — MAGNESIUM: Magnesium: 1.8 mg/dL (ref 1.7–2.4)

## 2020-11-18 LAB — PHOSPHORUS: Phosphorus: 3.7 mg/dL (ref 2.5–4.6)

## 2020-11-18 MED ORDER — MAGNESIUM SULFATE IN D5W 1-5 GM/100ML-% IV SOLN
1.0000 g | Freq: Once | INTRAVENOUS | Status: AC
  Administered 2020-11-18: 1 g via INTRAVENOUS
  Filled 2020-11-18: qty 100

## 2020-11-18 MED ORDER — ALBUMIN HUMAN 25 % IV SOLN
25.0000 g | Freq: Four times a day (QID) | INTRAVENOUS | Status: AC
  Administered 2020-11-18 (×2): 25 g via INTRAVENOUS
  Filled 2020-11-18 (×2): qty 100

## 2020-11-18 NOTE — Plan of Care (Signed)
  Problem: Activity: Goal: Risk for activity intolerance will decrease Outcome: Progressing   Problem: Coping: Goal: Level of anxiety will decrease Outcome: Progressing   Problem: Pain Managment: Goal: General experience of comfort will improve 11/18/2020 1714 by Dalbert Batman, RN Outcome: Progressing 11/18/2020 0758 by Dalbert Batman, RN Outcome: Progressing   Problem: Safety: Goal: Ability to remain free from injury will improve 11/18/2020 1714 by Dalbert Batman, RN Outcome: Progressing 11/18/2020 0758 by Dalbert Batman, RN Outcome: Progressing   Problem: Skin Integrity: Goal: Risk for impaired skin integrity will decrease 11/18/2020 1714 by Dalbert Batman, RN Outcome: Progressing 11/18/2020 0758 by Dalbert Batman, RN Outcome: Progressing

## 2020-11-18 NOTE — Progress Notes (Signed)
PROGRESS NOTE    Marcus Downs.  KPT:[id number] DOB: June 09, 1941 DOA: 10/24/2020 PCP: Ria Bush, MD   Brief Narrative:  Patient is a 80 year old African-American male with a past medical history significant for but not limited to vascular dementia, previous CVA with residual left-sided hemiplegia, essential hypertension, type 2 diabetes mellitus, dysphagia and hyperlipidemia as well as chronic urine retention with indwelling Foley catheter secondary to BPH and recent left femoral neck fracture status post left hemiarthroplasty on 08/08/2020 who presented from his nursing facility with altered mental status, agitation and acute kidney injury with electrolyte abnormalities with hyponatremia.  He was in septic shock and found to have an abscess around his arthroplasty hardware and an MSSA bacteremia and was deemed not a surgical candidate.  He was empirically treated with IV antibiotics for his MSSA infection and ended up having a left hip drain. He was transferred back to [facility] service 10/24/20. Overall his prognosis and clinical status remained poor and my colleagues have had multiple conversations with family about palliative care or hospice and family not ready yet.  On 11/08/2020 is pain to tube was removed and he was eating about 50% of his meals but this is not been adequately documented.  We will ask for a calorie count to see if the patient actually nutrition orally.  If he does not he may be hospice appropriate.  Palliative care following appreciate continue goals of care discussion.   Interim History Intermittently he has not been awake enough to participate in Examination and so ? How much he is really taking in orally. IR evaluated and patient's left hip abscess drain is intact and there can continue to follow and recommending a repeat CT scan when his output is minimal and less than 10 cc daily.   On 11/09/2020 patient had better intake with his Magic cup and will start obstruction.   Calorie count was extended for another day.  Lantus was added for the patient on 11/10/2020.  On day 3 of his calorie count patient continued to do well with Magic cup with excellent consumption even though meal consumption was poor.  Patient blood sugars were in 300 so they continue with same as opposed to more nutritionally dense Ensure supplements.  Nutritionist recommended trialing Nucynta a fourth time to help patient meet his calorie count and protein needs.  Calorie count was continued throughout the weekend and per facility he needed to eat at least 90% of his meals prior to discharge but unfortunately no meal documentation was obtained over the weekend and nutritionist was unable to provide any additional information regarding patient's p.o consumption.Marland Kitchen  He did not want another PEG tube Dobbhoff.  In the interim patient intermittent acute kidney injury and worsening renal status.  He was trialed on mirtazapine for appetite stimulant.  Family wants to continue IV antibiotic course for now.  On 12/25 2020 when he had no significant p.o. intake and was not even alert enough to take his medication.  He was started on <5 MLS per hour since his poor p.o. intake.  After further goals of care discussion with palliative care medicine patient was changed to DO NOT RESUSCITATE.  Palliative continues to meet with the patient and the family about goals of care.  Nephrology was consulted given his AKI and they feel it is in the setting of severe dehydration with concomitant hemodynamic changes/hypotension.  Dr. Carolin Sicks is unable to rule out and related antibiotic use.  We have repeated a urinalysis, order bladder  scan and renal ultrasound which has ruled out obstruction.  Patient was given a 500 mL bolus and started on normal saline at 125 MLS per hour.  He is not a candidate for dialysis treatment per nephrology recommendations  11/15/2020: Patient has some facial twitching likely in the setting of his uremia.   Continues to have severe dehydration and has been predisposed to his AKI because of his prior AKI.  They are recommending continuing IV fluid hydration given that they think he still dry and because he has no oral intake.  They also ordered him some albumin IV and recommending strict I's and O's and avoid hypotensive episodes.  His functional status is very poor and he is not a dialysis candidate.  They are recommending continuing half-normal saline at 125 mils per hour and patient is also getting D5W at 75 MLS per hour.  11/16/2020: Patient did not eat any of his meal today and has been off all his medications.  Any continued goals of care discussion and nephrology is continuing his fluids for now given his severe dehydration.  11/17/2020: Not taking in much p.o. intake and keeps refusing his medications.  Palliative care involvement call the patient's son and they did not answer.  Patient's prognosis is extremely poor and he is hospice appropriate and I feel that he will not continue to do well.  We will need to continue addressing goals of care discussion given that he is not eating anything or drink anything.  Renal function is minimally improved from yesterday and nephrology has changed some IV fluids around again and only to D5 half-normal saline at 75 mL's per hour.  11/18/20: Neurology is following creatinine is slowly improving but patient's appetite is still not very good and has not been taking anything orally per nursing.  Nursing was attempting to feed his medications and he was doing a little bit better today than he was but still not eating very much.  Nephrology is giving the patient a dose of albumin today given his low albumin status.  Currently still continuing palliative care goals of care discussion and will attempt to reach the family to update them about the patient's condition  Assessment & Plan:   Principal Problem:   MSSA bacteremia Active Problems:   Protein-calorie  malnutrition, severe ([place])   Encounter for nasogastric (NG) tube placement   Severe sepsis ([facility])   Abscess of left hip   Hypernatremia   Abscess   Acute kidney injury (nontraumatic) ([facility])   Dehydration   Elevated liver function tests   Prosthetic hip infection ([place])   Hyperkalemia   MSSA bacteremia/sepsis secondary to MSSA left hip abscess with septic shock: Prosthetic hip infection. poA Catheter Associated UTI -Treated with vasopressors and weaned off. -Blood cultures 12/4, MSSA bacteremia -Hip abscess 12/4, MSSA -Urine culture 12/4, MSSA -Repeat blood cultures negative.  Echocardiogram without any vegetation.   -Not a good candidate for operative removal/spacer or multistage surgery. -IR guided abscess drain, continues to drain.  Overall with poor clinical improvement. -Currently remains on cefazolin and rifampicin.  Total 6 weeks of antibiotics planned. -Antibiotic end date 12/03/2020. -IR evaluated and recommending repeating CT scanning when his output from his drain is minimal less than 10 cc/day -Oral intake has been extremely poor throughout the weekend and has declined his NG tube. -Plan was to transfer to nursing home in next 24 to 48 hours if he is able to eat at least 50% of his meals but calorie count is inaccurate  and will need to ensure that patient is adequately meeting his needs; if he is not tolerating p.o. left need to reevaluate goals of care discussion and he may be hospice appropriate as NG tube will not be replaced and PEG tube is not desired. -Patient has been doing good with his listener shakes p.o. 4 times daily and Magic of 3 times daily with meals and multivitamins however calorie count has not been discontinued given lack of nursing documentation -He has been started back on IV fluids and nephrology been consulted for AKI -Palliative care is involved in goals of care discussion and after discussion family wants to continue IV antibiotics at SNF with palliative  however if kidney function continues to worsen of care will be recommending hospice house. -Patient has a very high risk of further decompensation and decline his prognosis is extremely poor given that he is not eating or drinking.  He is hospice appropriate however will need to continue goals of care discussion and palliative care has reached out to the patient's family and they did not answer today and the palliative team suspects that the family will plan to revisit after the first of the year.  SVT and Sinus Tachycardia -Improving.  Treated with Cardizem.   -Now on Cardizem 60 mg po q8h and Metoprolol Tartrate 50 mg po BID and IV Metoprolol 2.5 IV q6hprn for HR>120 Sustained -Currently HR acceptable ranging from from 89-107 -TSH 1.6.  Acute metabolic encephalopathy in the setting of history of stroke and underlying vascular dementia -He has not made very much improvement in order course of his hospitalization and was somnolent today -Repeat MRI with no evidence of acute process.  Does have history of vascular dementia.   -Followed by palliative.  -Hospice recommended, family declined and could not make decisions. 12/20, patient started eating some but was not awake enough to take anything orally today.  Dobbhoff tube discontinued. -Initially he has had some improvement in mental status, however overall remains in very debilitated state; number to interact with me today but I cannot understand what he is saying he is very confused.  His albumin was 1.1 today -Calorie count has  Now been discontinued given an adequate documentation by nursing.  Patient is not eating or drinking anything the last few days.  He is more hospice appropriate but will need to continue to have goals of care discussion.  In the interim we will continue with IV fluid hydration with D5 half-normal saline per nephrology recommendations -Was more awake today but did not interact much or follow  commands  Hypocalcemia -Patient's calcium was 7.6 -Albumin was 1.8 -Corrected Calcium is 9.4 -We will continue to monitor and trend and repeat CMP in a.m.  Thrombocytosis -Likely reactive  -Patient's platelet count is improved and now 236 -Continue Monitor and Trend -Repeat CBC in the AM  Hypernatremia, improved -In the setting of dehydration from poor oral intake -Patient's sodium is now 141 -Continue with fluid hydration as well per nephrology -Continue to monitor and trend and repeat CMP in a.m.  Microcytic Anemia -Patient's Hgb/Hct is relatively stable and improved from 8.5/24.6 -> 9.3/27.4 -Checked anemia panel showed iron level of 25, U IBC 108, TIBC 133, saturation of 90%, ferritin of 559, folate level of 7.9, vitamin B12 332 -Continue to Monitor for S/Sx of Bleeding; Currently no overt bleeding noted -Repeat CBC in the AM  GERD/GI Prophylaxis -C/w Pantoprazole 40 mg po Daily   Hypomagnesemia -Mag Level was 1.8 -Replete with IV Mag  Sulfate 1 mg -Continue to Monitor and Replete as Necessary -Repeat Mag Level in the AM   Acute Kidney Injury with Hypernatremia, worsened  Metabolic acidosis -Sodium level improved with increasing free water flush.   -Renal function and sodium have both normalized but significantly elevated yesterday -Patient's sodium this morning was 147 and patient's BUNs/creatinine has gone from 11/1.00 on the 23rd December and has significantly worsened and 36/5.67 yesterday when it peaked and is now slowly coming down and repeat today is 31/5.11 -will obtain a urinalysis, urine culture, bladder scan, urine electrolytes as well as renal ultrasound -Nephrology was consulted for further evaluation and have made further adjustments to the fluids and patient is now on D5 half-normal saline at 75 mils per hour -patient now has a CO2 of 18, anion gap of 9, chloride level of 114 -Avoid nephrotoxic medications, contrast dyes, hypotension and renally dose  medications -Continue to monitor and trend and repeat CMP in a.m.  Type 2 Diabetes, uncontrolled with Hyperglycemia -> Hypoglycemia -HbA1c 8.1.  -On 12/20, hypoglycemic episodes after stopping tube feeding and continuing on long-acting insulin. -Decrease dose of long-acting insulin, sliding scale insulin to sensitive scale. He is now on Sensitive Novolog SSI AC/HS -If he is not eating well, he will not need that much insulin and is now on 10 units of Lantus subcu daily -CBGs ranging from 74-183; Continue to monitor and adjust blood sugars as necessary  Dysphagia -Remained with poor mentation again today.  Initially was eating with help but his oral intake is not being properly documented.   -He does not want a PEG tube  -No reinsertion of Dobbhoff tube recommended.   -We can see next few days and continue to have palliative discussion and if he is not improving may be hospice appropriate  Pressure Injury Pressure Injury 08/07/20 Buttocks Right;Medial;Left Stage 2 -  Partial thickness loss of dermis presenting as a shallow open injury with a red, pink wound bed without slough. (Active)  08/07/20 1914  Location: Buttocks  Location Orientation: Right;Medial;Left  Staging: Stage 2 -  Partial thickness loss of dermis presenting as a shallow open injury with a red, pink wound bed without slough.  Wound Description (Comments):   Present on Admission: Yes     Pressure Injury 11/02/20 Penis (Active)  11/02/20 0344  Location: Penis (distal meatus)  Location Orientation:   Staging:   Wound Description (Comments):   Present on Admission:    Severe Malnutrition in the Context of Chronic Illness -Estimated body mass index is 21.08 kg/m as calculated from the following:   Height as of this encounter: 6' (1.829 m).   Weight as of this encounter: 70.5 kg.  -Nutrition Problem: Severe Malnutrition -Etiology: chronic illness (Dementia) -Signs/Symptoms: severe fat depletion,severe muscle  depletion,percent weight loss (15.4% weight loss in 9 months) -Percent weight loss: 15.4 % (less than 9 months) -Interventions: Tube feeding,Prostat,MVI; TF now discontinued and Panda Tube Removed -Nutritionist consulted for further evaluation and recommendations -C/w 48 hour Calorie Count but she has not been keeping track of his meals appropriately so we will need to extend this out given the very limited no documentation abdomen completed by nursing staff.  Nutrition was stressing the importance of keeping accurate documentation and it is unclear if the patient is consuming his supplements well.  Will need to make an informed decision of whether he is able to adequately meet his nutritional needs orally we will need to have continued goals of care discussion and possible transfer to  hospice if he does not -Nutritionist recommending c/w Glucerna Shake po TID, Magic Cup TIDwm, and MVI Daily; stressed importance of obtaining a calorie count -Patient is not eating very much and documentation of the calorie count has now stopped. -We will need to continue goals of care discussion given he continues to not eat very well over the last few days and has not been taking anything orally including his supplements for his medications.  DVT prophylaxis: Heparin 5,000 units sq q8h Code Status: DO NOT RESUSCITATE  Family Communication: No family present at bedside  Disposition Plan: Anticipating D/C to SNF if able to tolerate po Intake adequately vs Hospice Appropriate; will need continued goals of care discussion as he is not eating significantly and will have continued palliative care discussions and palliative care call the patient's family to discuss the patient's prognosis and they did not answer.  Status is: Inpatient  Remains inpatient appropriate because:Unsafe d/c plan, IV treatments appropriate due to intensity of illness or inability to take PO and Inpatient level of care appropriate due to severity of  illness   Dispo:  Patient From:  Home  Planned Disposition:  SNF  Expected discharge date: 11/17/2020  Medically stable for discharge:  No  Consultants:   Palliative Care  Infectious Diseases  Interventional Radiology  PCCM Transfer  Orthopedic Surgery  Nephrology   Procedures:  Left hip abscess s/p abscess drain to the left thigh on 11/08/2020  Antimicrobials: Anti-infectives (From admission, onward)   Start     Dose/Rate Route Frequency Ordered Stop   11/14/20 0300  ceFAZolin (ANCEF) IVPB 1 g/50 mL premix        1 g 100 mL/hr over 30 Minutes Intravenous Every 12 hours 11/13/20 1952     11/08/20 2200  rifampin (RIFADIN) capsule 300 mg  Status:  Discontinued        300 mg Oral Every 12 hours 11/08/20 1319 11/08/20 1322   11/08/20 2200  rifampin (RIFADIN) 25 mg/mL oral suspension 300 mg        300 mg Oral Every 12 hours 11/08/20 1322     11/06/20 2200  rifampin (RIFADIN) 25 mg/mL oral suspension 300 mg  Status:  Discontinued        300 mg Per Tube Every 12 hours 11/06/20 1008 11/08/20 1319   10/26/20 1100  rifampin (RIFADIN) 300 mg in sodium chloride 0.9 % 100 mL IVPB  Status:  Discontinued        300 mg 200 mL/hr over 30 Minutes Intravenous Every 12 hours 10/26/20 1007 11/06/20 1008   10/24/20 0815  ceFAZolin (ANCEF) IVPB 2g/100 mL premix  Status:  Discontinued        2 g 200 mL/hr over 30 Minutes Intravenous Every 8 hours 10/24/20 0804 11/13/20 1952   10/23/20 2200  ceFAZolin (ANCEF) IVPB 2g/100 mL premix  Status:  Discontinued        2 g 200 mL/hr over 30 Minutes Intravenous Every 12 hours 10/23/20 1406 10/24/20 0804   10/22/20 1000  ceFAZolin (ANCEF) IVPB 1 g/50 mL premix  Status:  Discontinued        1 g 100 mL/hr over 30 Minutes Intravenous Every 12 hours 10/23/2020 1745 10/23/20 1406   11/05/2020 2200  ceFAZolin (ANCEF) IVPB 2g/100 mL premix        2 g 200 mL/hr over 30 Minutes Intravenous  Once 10/23/2020 1739 10/18/2020 2239   11/02/2020 2100  cefTRIAXone (ROCEPHIN) 2  g in sodium chloride 0.9 % 100 mL IVPB  Status:  Discontinued        2 g 200 mL/hr over 30 Minutes Intravenous Every 24 hours 10/19/2020 1117 10/28/2020 1129   11/06/2020 1730  linezolid (ZYVOX) IVPB 600 mg  Status:  Discontinued        600 mg 300 mL/hr over 60 Minutes Intravenous Every 12 hours 10/29/2020 1154 10/18/2020 1739   10/29/2020 1130  cefTRIAXone (ROCEPHIN) 2 g in sodium chloride 0.9 % 100 mL IVPB  Status:  Discontinued        2 g 200 mL/hr over 30 Minutes Intravenous Every 24 hours 11/13/2020 1129 11/08/2020 1739   11/01/2020 1000  meropenem (MERREM) 500 mg in sodium chloride 0.9 % 100 mL IVPB  Status:  Discontinued        500 mg 200 mL/hr over 30 Minutes Intravenous Every 12 hours 10/19/2020 0330 10/22/2020 1117   10/18/2020 0400  linezolid (ZYVOX) IVPB 600 mg        600 mg 300 mL/hr over 60 Minutes Intravenous  Once 10/30/2020 0309 11/02/2020 0654   10/30/2020 0215  ceFEPIme (MAXIPIME) 2 g in sodium chloride 0.9 % 100 mL IVPB        2 g 200 mL/hr over 30 Minutes Intravenous  Once 11/08/2020 0212 10/28/2020 0306   10/26/2020 0215  ampicillin-sulbactam (UNASYN) 1.5 g in sodium chloride 0.9 % 100 mL IVPB  Status:  Discontinued        1.5 g 200 mL/hr over 30 Minutes Intravenous  Once 11/14/2020 0212 11/16/2020 0214   10/26/2020 0215  vancomycin (VANCOCIN) IVPB 1000 mg/200 mL premix  Status:  Discontinued        1,000 mg 200 mL/hr over 60 Minutes Intravenous  Once 10/31/2020 9528 11/09/2020 0314        Subjective: Seen and examined at bedside and was more awake today and nursing was attempting to feed his medications but he kept spitting some of them out. Nursing states that she is doing fine work on gait and has loosened her short down. He has magnesium running and nephrology is ordered IV albumin. Renal function is slowly improving still no family at bedside. Nursing states that his appetite has not been very good today again but he appeared more awake and alert but he does not interact very much. Does not appear to be in  any acute distress but will continue to discuss with the family about goals of care given that his prognosis is very poor and he is a very high risk for further decompensation worsening.  Objective: Vitals:   11/17/20 1944 11/18/20 0440 11/18/20 0803 11/18/20 1313  BP: (!) 152/65 129/77 122/63 112/64  Pulse: 91 90 88 73  Resp: 18 18 18 16   Temp: 99.2 F (37.3 C) 99.3 F (37.4 C) 98.3 F (36.8 C) 97.9 F (36.6 C)  TempSrc: Oral Oral    SpO2: 100% 92% 100% 100%  Weight:      Height:        Intake/Output Summary (Last 24 hours) at 11/18/2020 1547 Last data filed at 11/18/2020 1141 Gross per 24 hour  Intake 15 ml  Output 1375 ml  Net -1360 ml   Filed Weights   11/02/20 0825 11/05/20 0500 11/09/20 0451  Weight: 71.5 kg 72.8 kg 70.5 kg   Examination: Physical Exam:  Constitutional: Patient is a very thin chronically ill-appearing African-American male who is more awake and alert today but does not really interact and continues to be not as responsive but is spitting out his medications as  the nurse is trying to give them to him Eyes: Lids and conjunctivae normal, sclerae anicteric  ENMT: External Ears, Nose appear normal. Grossly normal hearing.  Neck: Appears normal, supple, no cervical masses, normal ROM, no appreciable thyromegaly; no JVD Respiratory: Diminished to auscultation bilaterally with coarse breath sounds, no wheezing, rales, rhonchi or crackles. Normal respiratory effort and patient is not tachypenic. No accessory muscle use.  Unlabored breathing Cardiovascular: RRR, no murmurs / rubs / gallops. S1 and S2 auscultated.  Abdomen: Soft, non-tender, non-distended. Bowel sounds positive.  GU: Deferred.  Foley catheter is in place Musculoskeletal: No clubbing / cyanosis of digits/nails.  Has a drain on the left hip Skin: No rashes, lesions, ulcers on limited skin evaluation. No induration; Warm and dry.  Neurologic: He is awake and alert but does not follow  commands Psychiatric: Impaired judgment and insight.  He is awake alert but not fully oriented.  Not as somnolent   Data Reviewed: I have personally reviewed following labs and imaging studies  CBC: Recent Labs  Lab 11/13/20 1622 11/14/20 0832 11/15/20 0238 11/17/20 0737 11/18/20 0258  WBC 9.9 7.9 7.0 6.8 7.0  NEUTROABS  --  5.5 4.9 4.9 5.0  HGB 9.2* 8.1* 9.1* 8.5* 9.3*  HCT 27.9* 23.2* 27.7* 24.6* 27.4*  MCV 81.6 78.9* 81.2 79.1* 78.5*  PLT 320 343 294 224 236   Basic Metabolic Panel: Recent Labs  Lab 11/14/20 0334 11/14/20 0832 11/15/20 0238 11/16/20 0213 11/17/20 0232 11/18/20 0258  NA 147*  --  147* 145 138 141  K 3.3*  --  4.2 3.2* 3.8 3.5  CL 117*  --  118* 116* 110 114*  CO2 20*  --  19* 17* 16* 18*  GLUCOSE 179*  --  129* 79 160* 127*  BUN 35*  --  36* 34* 33* 31*  CREATININE 5.34*  --  5.67* 5.54* 5.35* 5.11*  CALCIUM 7.6*  --  7.3* 7.6* 7.6* 7.6*  MG  --  2.2 2.0 1.9 1.6* 1.8  PHOS  --  4.4 4.2 3.8 4.0 3.7   GFR: Estimated Creatinine Clearance: 11.9 mL/min (A) (by C-G formula based on SCr of 5.11 mg/dL (H)). Liver Function Tests: Recent Labs  Lab 11/14/20 0832 11/15/20 0238 11/16/20 0213 11/17/20 0232 11/18/20 0258  AST 15 18 16 26 24   ALT <5 <5 <5 <5 5  ALKPHOS 202* 194* 137* 166* 166*  BILITOT 0.3 0.4 0.2* 0.5 0.3  PROT 7.2 7.2 6.9 6.6 6.7  ALBUMIN 1.0* 1.1* 2.7* 1.9* 1.8*   No results for input(s): LIPASE, AMYLASE in the last 168 hours. No results for input(s): AMMONIA in the last 168 hours. Coagulation Profile: No results for input(s): INR, PROTIME in the last 168 hours. Cardiac Enzymes: No results for input(s): CKTOTAL, CKMB, CKMBINDEX, TROPONINI in the last 168 hours. BNP (last 3 results) No results for input(s): PROBNP in the last 8760 hours. HbA1C: No results for input(s): HGBA1C in the last 72 hours. CBG: Recent Labs  Lab 11/17/20 1158 11/17/20 1634 11/17/20 2158 11/18/20 0558 11/18/20 1139  GLUCAP 174* 131* 124* 99 110*    Lipid Profile: No results for input(s): CHOL, HDL, LDLCALC, TRIG, CHOLHDL, LDLDIRECT in the last 72 hours. Thyroid Function Tests: No results for input(s): TSH, T4TOTAL, FREET4, T3FREE, THYROIDAB in the last 72 hours. Anemia Panel: No results for input(s): VITAMINB12, FOLATE, FERRITIN, TIBC, IRON, RETICCTPCT in the last 72 hours. Sepsis Labs: No results for input(s): PROCALCITON, LATICACIDVEN in the last 168 hours.  Recent Results (  from the past 240 hour(s))  Resp Panel by RT-PCR (Flu A&B, Covid) Nasopharyngeal Swab     Status: None   Collection Time: 11/10/20  3:16 AM   Specimen: Nasopharyngeal Swab; Nasopharyngeal(NP) swabs in vial transport medium  Result Value Ref Range Status   SARS Coronavirus 2 by RT PCR NEGATIVE NEGATIVE Final    Comment: (NOTE) SARS-CoV-2 target nucleic acids are NOT DETECTED.  The SARS-CoV-2 RNA is generally detectable in upper respiratory specimens during the acute phase of infection. The lowest concentration of SARS-CoV-2 viral copies this assay can detect is 138 copies/mL. A negative result does not preclude SARS-Cov-2 infection and should not be used as the sole basis for treatment or other patient management decisions. A negative result may occur with  improper specimen collection/handling, submission of specimen other than nasopharyngeal swab, presence of viral mutation(s) within the areas targeted by this assay, and inadequate number of viral copies(<138 copies/mL). A negative result must be combined with clinical observations, patient history, and epidemiological information. The expected result is Negative.  Fact Sheet for Patients:  BloggerCourse.com  Fact Sheet for Healthcare Providers:  SeriousBroker.it  This test is no t yet approved or cleared by the [place] FDA and  has been authorized for detection and/or diagnosis of SARS-CoV-2 by FDA under an Emergency Use Authorization  (EUA). This EUA will remain  in effect (meaning this test can be used) for the duration of the COVID-19 declaration under Section 564(b)(1) of the Act, 21 U.S.C.section 360bbb-3(b)(1), unless the authorization is terminated  or revoked sooner.       Influenza A by PCR NEGATIVE NEGATIVE Final   Influenza B by PCR NEGATIVE NEGATIVE Final    Comment: (NOTE) The Xpert Xpress SARS-CoV-2/FLU/RSV plus assay is intended as an aid in the diagnosis of influenza from Nasopharyngeal swab specimens and should not be used as a sole basis for treatment. Nasal washings and aspirates are unacceptable for Xpert Xpress SARS-CoV-2/FLU/RSV testing.  Fact Sheet for Patients: BloggerCourse.com  Fact Sheet for Healthcare Providers: SeriousBroker.it  This test is not yet approved or cleared by the [place] FDA and has been authorized for detection and/or diagnosis of SARS-CoV-2 by FDA under an Emergency Use Authorization (EUA). This EUA will remain in effect (meaning this test can be used) for the duration of the COVID-19 declaration under Section 564(b)(1) of the Act, 21 U.S.C. section 360bbb-3(b)(1), unless the authorization is terminated or revoked.  Performed at [facility], [address] [place], [place] [address]   Culture, Urine     Status: Abnormal   Collection Time: 11/14/20 10:37 AM   Specimen: Urine, Random  Result Value Ref Range Status   Specimen Description URINE, RANDOM  Final   Special Requests   Final    NONE Performed at [facility], [address], [place], [place] [address]    Culture >=100,000 COLONIES/mL YEAST (A)  Final   Report Status 11/15/2020 FINAL  Final     RN Pressure Injury Documentation: Pressure Injury 08/07/20 Buttocks Right;Medial;Left Stage 2 -  Partial thickness loss of dermis presenting as a shallow open injury with a red, pink wound bed without slough. (Active)  08/07/20 1914   Location: Buttocks  Location Orientation: Right;Medial;Left  Staging: Stage 2 -  Partial thickness loss of dermis presenting as a shallow open injury with a red, pink wound bed without slough.  Wound Description (Comments):   Present on Admission: Yes     Pressure Injury 11/02/20 Penis (Active)  11/02/20 0344  Location: Penis  Location Orientation:   Staging:   Wound Description (Comments):   Present on Admission:      Estimated body mass index is 21.08 kg/m as calculated from the following:   Height as of this encounter: 6' (1.829 m).   Weight as of this encounter: 70.5 kg.  Malnutrition Type:  Nutrition Problem: Severe Malnutrition Etiology: chronic illness (dementia)   Malnutrition Characteristics:  Signs/Symptoms: severe fat depletion,severe muscle depletion,percent weight loss (15.4% weight loss in 9 months) Percent weight loss: 15.4 % (less than 9 months)   Nutrition Interventions:  Interventions: Glucerna shake,MVI,Magic cup,Calorie Count    Radiology Studies: No results found.  Scheduled Meds: . brimonidine  1 drop Both Eyes TID  . Chlorhexidine Gluconate Cloth  6 each Topical Daily  . diltiazem  60 mg Oral Q8H  . feeding supplement (GLUCERNA SHAKE)  237 mL Oral TID PC & HS  . heparin injection (subcutaneous)  5,000 Units Subcutaneous Q8H  . insulin aspart  0-5 Units Subcutaneous QHS  . insulin aspart  0-9 Units Subcutaneous TID WC  . insulin glargine  10 Units Subcutaneous QHS  . latanoprost  1 drop Both Eyes QHS  . metoprolol tartrate  50 mg Oral BID  . metoprolol tartrate  5 mg Intravenous Once  . mirtazapine  7.5 mg Oral QHS  . multivitamin with minerals  1 tablet Oral Daily  . mupirocin ointment   Topical BID  . pantoprazole sodium  40 mg Oral Daily  . rifampin  300 mg Oral Q12H  . sodium chloride flush  5 mL Intracatheter Q8H   Continuous Infusions: . sodium chloride 250 mL (11/01/20 2145)  .  ceFAZolin (ANCEF) IV 1 g (11/18/20 0334)  .  dextrose 5 % and 0.45% NaCl 75 mL/hr at 11/17/20 1234    LOS: 28 days   Merlene Laughtermair Latif Alfredo Spong, DO [facility] PAGER is on AMION  If 7PM-7AM, please contact night-coverage [email or web address]

## 2020-11-18 NOTE — Progress Notes (Signed)
[facility] NEPHROLOGY PROGRESS NOTE  Assessment/ Plan: Pt is a 80 y.o. yo male  with past medical history of stroke with residual left-sided hemiplegia, vascular dementia, hypertension, DM, dysphagia, HLD, chronic urinary retention on indwelling Foley catheter due to BPH, recent left femoral neck fracture required hemiarthroplasty in 07/2020 admitted on 12/4 from SNF for the concern of UTI, sepsis seen as a consultation for the evaluation of AKI.  #Acute kidney injury, nonoliguric: Likely dehydration with ATN.  Recurrent AKI in the past.  Chronic bladder outlet obstruction.  Baseline normal kidney function.  Cannot rule out AIN but less likely. -Poor oral intake, ongoing confusion -Improving urine output and creatinine -Strict ins and out, avoid hypotensive episode or nephrotoxins. -Given severe dementia, very poor functional status and multiple comorbidities, he is not a candidate for dialysis treatment.  Noted he is DNR and he was seen by palliative care team. 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  Blood culture growing MSSA and currently on cefazolin and rifampicin.  Echo without any vegetation.  For now continue antibiotics.  #Severe vascular dementia, stroke and may have some acute metabolic encephalopathy: MRI of the brain with chronic infarction and cerebral atrophy.  Continue supportive and palliative care.  #Severe protein calorie malnutrition, chronic dysphagia: Very poor oral intake per nursing staff.  Per report he declined feeding tube placement.  IV fluid for hydration.   Subjective: Does not respond to quetioning. Crt improving. UOP adequate Objective Vital signs in last 24 hours: Vitals:   11/17/20 1627 11/17/20 1944 11/18/20 0440 11/18/20 0803  BP:  (!) 112/57 (!) 152/65 129/77 122/63  Pulse: 79 91 90 88  Resp: 16 18 18 18   Temp: 99.6 F (37.6 C) 99.2 F (37.3 C) 99.3 F (37.4 C) 98.3 F (36.8 C)  TempSrc: Oral Oral Oral   SpO2:  100% 92% 100%  Weight:      Height:       Weight change:   Intake/Output Summary (Last 24 hours) at 11/18/2020 1020 Last data filed at 11/18/2020 0530 Gross per 24 hour  Intake 1716.5 ml  Output 1000 ml  Net 716.5 ml       Labs: Basic Metabolic Panel: Recent Labs  Lab 11/16/20 0213 11/17/20 0232 11/18/20 0258  NA 145 138 141  K 3.2* 3.8 3.5  CL 116* 110 114*  CO2 17* 16* 18*  GLUCOSE 79 160* 127*  BUN 34* 33* 31*  CREATININE 5.54* 5.35* 5.11*  CALCIUM 7.6* 7.6* 7.6*  PHOS 3.8 4.0 3.7   Liver Function Tests: Recent Labs  Lab 11/16/20 0213 11/17/20 0232 11/18/20 0258  AST 16 26 24   ALT <5 <5 5  ALKPHOS 137* 166* 166*  BILITOT 0.2* 0.5 0.3  PROT 6.9 6.6 6.7  ALBUMIN 2.7* 1.9* 1.8*   No results for input(s): LIPASE, AMYLASE in the last 168 hours. No results for input(s): AMMONIA in the last 168 hours. CBC: Recent Labs  Lab 11/13/20 1622 11/13/20 1622 11/14/20 0832 11/15/20 0238 11/17/20 0737 11/18/20 0258  WBC 9.9  --  7.9 7.0 6.8 7.0  NEUTROABS  --    < > 5.5 4.9 4.9 5.0  HGB 9.2*  --  8.1* 9.1* 8.5* 9.3*  HCT 27.9*  --  23.2* 27.7* 24.6* 27.4*  MCV 81.6  --  78.9* 81.2 79.1* 78.5*  PLT 320  --  343 294 224 236   < > = values in this interval not displayed.   Cardiac Enzymes: No results for input(s): CKTOTAL, CKMB, CKMBINDEX, TROPONINI in the last 168 hours. CBG: Recent Labs  Lab 11/17/20 0801 11/17/20 1158 11/17/20 1634 11/17/20 2158 11/18/20 0558  GLUCAP 183* 174* 131* 124* 99    Iron Studies: No results for input(s): IRON, TIBC, TRANSFERRIN, FERRITIN in the last 72 hours. Studies/Results: No results found.  Medications: Infusions: . sodium chloride 250 mL (11/01/20 2145)  . albumin human 25 g (11/18/20 0835)  .  ceFAZolin (ANCEF) IV 1 g  (11/18/20 0334)  . dextrose 5 % and 0.45% NaCl 75 mL/hr at 11/17/20 1234  . magnesium sulfate bolus IVPB      Scheduled Medications: . brimonidine  1 drop Both Eyes TID  . Chlorhexidine Gluconate Cloth  6 each Topical Daily  . diltiazem  60 mg Oral Q8H  . feeding supplement (GLUCERNA SHAKE)  237 mL Oral TID PC & HS  . heparin injection (subcutaneous)  5,000 Units Subcutaneous Q8H  . insulin aspart  0-5 Units Subcutaneous QHS  . insulin aspart  0-9 Units Subcutaneous TID WC  . insulin glargine  10 Units Subcutaneous QHS  . latanoprost  1 drop Both Eyes QHS  . metoprolol tartrate  50 mg Oral BID  . metoprolol tartrate  5 mg Intravenous Once  . mirtazapine  7.5 mg Oral QHS  . multivitamin with minerals  1 tablet Oral Daily  . mupirocin ointment   Topical BID  . pantoprazole sodium  40 mg Oral Daily  . rifampin  300 mg Oral Q12H  . sodium chloride flush  5 mL Intracatheter Q8H    have reviewed scheduled and prn medications.  Physical Exam: General:  chronically ill-appearing, lying in bed, no distress Heart: Normal rate, no audible murmurs Lungs: Bilateral chest rise, no increased work of breathing Abdomen:soft, Non-tender, non-distended Extremities:No LE edema Neurology: awake and alert but not interactive  Ainsley J Iam Lipson 11/18/2020,10:20 AM  LOS: 28 days

## 2020-11-18 NOTE — Progress Notes (Signed)
Patient holds medications in mouth then spits them out.  Patient swallowed one syringe of liquid medications before spitting them out.  Refuse offer of food or drinks.  Refused Ensure.  Offered bites of his dinner tray, patient refused.

## 2020-11-18 NOTE — Plan of Care (Signed)
  Problem: Activity: Goal: Risk for activity intolerance will decrease Outcome: Progressing   Problem: Coping: Goal: Level of anxiety will decrease Outcome: Progressing   Problem: Pain Managment: Goal: General experience of comfort will improve Outcome: Progressing   Problem: Safety: Goal: Ability to remain free from injury will improve Outcome: Progressing   Problem: Skin Integrity: Goal: Risk for impaired skin integrity will decrease Outcome: Progressing   

## 2020-11-18 DEATH — deceased

## 2020-11-19 DIAGNOSIS — L0291 Cutaneous abscess, unspecified: Secondary | ICD-10-CM | POA: Diagnosis not present

## 2020-11-19 DIAGNOSIS — L02416 Cutaneous abscess of left lower limb: Secondary | ICD-10-CM | POA: Diagnosis not present

## 2020-11-19 DIAGNOSIS — Z515 Encounter for palliative care: Secondary | ICD-10-CM | POA: Diagnosis not present

## 2020-11-19 DIAGNOSIS — A419 Sepsis, unspecified organism: Secondary | ICD-10-CM | POA: Diagnosis not present

## 2020-11-19 DIAGNOSIS — N179 Acute kidney failure, unspecified: Secondary | ICD-10-CM | POA: Diagnosis not present

## 2020-11-19 DIAGNOSIS — R7881 Bacteremia: Secondary | ICD-10-CM | POA: Diagnosis not present

## 2020-11-19 LAB — GLUCOSE, CAPILLARY
Glucose-Capillary: 152 mg/dL — ABNORMAL HIGH (ref 70–99)
Glucose-Capillary: 142 mg/dL — ABNORMAL HIGH (ref 70–99)

## 2020-11-19 LAB — CBC WITH DIFFERENTIAL/PLATELET
Abs Immature Granulocytes: 0.03 10*3/uL (ref 0.00–0.07)
Basophils Absolute: 0 10*3/uL (ref 0.0–0.1)
Basophils Relative: 1 %
Eosinophils Absolute: 0.3 10*3/uL (ref 0.0–0.5)
Eosinophils Relative: 5 %
HCT: 27.2 % — ABNORMAL LOW (ref 39.0–52.0)
Hemoglobin: 9.2 g/dL — ABNORMAL LOW (ref 13.0–17.0)
Immature Granulocytes: 1 %
Lymphocytes Relative: 18 %
Lymphs Abs: 1.1 10*3/uL (ref 0.7–4.0)
MCH: 26.2 pg (ref 26.0–34.0)
MCHC: 33.8 g/dL (ref 30.0–36.0)
MCV: 77.5 fL — ABNORMAL LOW (ref 80.0–100.0)
Monocytes Absolute: 0.4 10*3/uL (ref 0.1–1.0)
Monocytes Relative: 7 %
Neutro Abs: 4.2 10*3/uL (ref 1.7–7.7)
Neutrophils Relative %: 68 %
Platelets: 242 10*3/uL (ref 150–400)
RBC: 3.51 MIL/uL — ABNORMAL LOW (ref 4.22–5.81)
RDW: 20.6 % — ABNORMAL HIGH (ref 11.5–15.5)
WBC: 6 10*3/uL (ref 4.0–10.5)
nRBC: 0 % (ref 0.0–0.2)

## 2020-11-19 LAB — MAGNESIUM: Magnesium: 2 mg/dL (ref 1.7–2.4)

## 2020-11-19 LAB — COMPREHENSIVE METABOLIC PANEL
ALT: <5 U/L (ref 0–44)
AST: 34 U/L (ref 15–41)
Albumin: 2.2 g/dL — ABNORMAL LOW (ref 3.5–5.0)
Alkaline Phosphatase: 162 U/L — ABNORMAL HIGH (ref 38–126)
Anion gap: 10 (ref 5–15)
BUN: 29 mg/dL — ABNORMAL HIGH (ref 8–23)
CO2: 18 mmol/L — ABNORMAL LOW (ref 22–32)
Calcium: 7.8 mg/dL — ABNORMAL LOW (ref 8.9–10.3)
Chloride: 115 mmol/L — ABNORMAL HIGH (ref 98–111)
Creatinine, Ser: 4.88 mg/dL — ABNORMAL HIGH (ref 0.61–1.24)
GFR, Estimated: 11 mL/min — ABNORMAL LOW (ref >60)
Glucose, Bld: 159 mg/dL — ABNORMAL HIGH (ref 70–99)
Potassium: 3.8 mmol/L (ref 3.5–5.1)
Sodium: 143 mmol/L (ref 135–145)
Total Bilirubin: 0.7 mg/dL (ref 0.3–1.2)
Total Protein: 6.9 g/dL (ref 6.5–8.1)

## 2020-11-19 LAB — PHOSPHORUS: Phosphorus: 3.8 mg/dL (ref 2.5–4.6)

## 2020-11-19 MED ORDER — ONDANSETRON HCL 4 MG/2ML IJ SOLN: 4.0000 mg | Freq: Four times a day (QID) | INTRAMUSCULAR | Status: DC | PRN

## 2020-11-19 MED ORDER — ONDANSETRON 4 MG PO TBDP: 4.0000 mg | ORAL_TABLET | Freq: Four times a day (QID) | ORAL | Status: DC | PRN

## 2020-11-19 MED ORDER — BIOTENE DRY MOUTH MT LIQD: 15.0000 mL | OROMUCOSAL | Status: DC | PRN

## 2020-11-19 MED ORDER — HYDROMORPHONE HCL 1 MG/ML IJ SOLN
1.0000 mg | INTRAMUSCULAR | Status: DC | PRN
  Administered 2020-11-22: 1 mg via INTRAVENOUS
  Filled 2020-11-19: qty 1

## 2020-11-19 MED ORDER — GLYCOPYRROLATE 1 MG PO TABS
1.0000 mg | ORAL_TABLET | ORAL | Status: DC | PRN
  Filled 2020-11-19: qty 1

## 2020-11-19 MED ORDER — POLYVINYL ALCOHOL 1.4 % OP SOLN
1.0000 [drp] | Freq: Four times a day (QID) | OPHTHALMIC | Status: DC | PRN
Start: 2020-11-19 — End: 2020-11-27
  Filled 2020-11-19: qty 15

## 2020-11-19 MED ORDER — GLYCOPYRROLATE 0.2 MG/ML IJ SOLN
0.2000 mg | INTRAMUSCULAR | Status: DC | PRN
  Filled 2020-11-19: qty 1

## 2020-11-19 MED ORDER — HALOPERIDOL LACTATE 2 MG/ML PO CONC
0.5000 mg | ORAL | Status: DC | PRN
  Filled 2020-11-19: qty 0.3

## 2020-11-19 MED ORDER — HALOPERIDOL 0.5 MG PO TABS
0.5000 mg | ORAL_TABLET | ORAL | Status: DC | PRN
  Filled 2020-11-19: qty 1

## 2020-11-19 MED ORDER — HALOPERIDOL LACTATE 5 MG/ML IJ SOLN: 0.5000 mg | INTRAMUSCULAR | Status: DC | PRN

## 2020-11-19 MED ORDER — LORAZEPAM 2 MG/ML IJ SOLN: 2.0000 mg | INTRAMUSCULAR | Status: DC | PRN

## 2020-11-19 MED ORDER — DEXTROSE IN LACTATED RINGERS 5 % IV SOLN: INTRAVENOUS | Status: DC

## 2020-11-19 NOTE — Progress Notes (Signed)
PROGRESS NOTE    Marcus Downs.  KPT:[id number] DOB: June 09, 1941 DOA: 10/24/2020 PCP: Ria Bush, MD   Brief Narrative:  Patient is a 80 year old African-American male with a past medical history significant for but not limited to vascular dementia, previous CVA with residual left-sided hemiplegia, essential hypertension, type 2 diabetes mellitus, dysphagia and hyperlipidemia as well as chronic urine retention with indwelling Foley catheter secondary to BPH and recent left femoral neck fracture status post left hemiarthroplasty on 08/08/2020 who presented from his nursing facility with altered mental status, agitation and acute kidney injury with electrolyte abnormalities with hyponatremia.  He was in septic shock and found to have an abscess around his arthroplasty hardware and an MSSA bacteremia and was deemed not a surgical candidate.  He was empirically treated with IV antibiotics for his MSSA infection and ended up having a left hip drain. He was transferred back to [facility] service 10/24/20. Overall his prognosis and clinical status remained poor and my colleagues have had multiple conversations with family about palliative care or hospice and family not ready yet.  On 11/08/2020 is pain to tube was removed and he was eating about 50% of his meals but this is not been adequately documented.  We will ask for a calorie count to see if the patient actually nutrition orally.  If he does not he may be hospice appropriate.  Palliative care following appreciate continue goals of care discussion.   Interim History Intermittently he has not been awake enough to participate in Examination and so ? How much he is really taking in orally. IR evaluated and patient's left hip abscess drain is intact and there can continue to follow and recommending a repeat CT scan when his output is minimal and less than 10 cc daily.   On 11/09/2020 patient had better intake with his Magic cup and will start obstruction.   Calorie count was extended for another day.  Lantus was added for the patient on 11/10/2020.  On day 3 of his calorie count patient continued to do well with Magic cup with excellent consumption even though meal consumption was poor.  Patient blood sugars were in 300 so they continue with same as opposed to more nutritionally dense Ensure supplements.  Nutritionist recommended trialing Nucynta a fourth time to help patient meet his calorie count and protein needs.  Calorie count was continued throughout the weekend and per facility he needed to eat at least 90% of his meals prior to discharge but unfortunately no meal documentation was obtained over the weekend and nutritionist was unable to provide any additional information regarding patient's p.o consumption.Marland Kitchen  He did not want another PEG tube Dobbhoff.  In the interim patient intermittent acute kidney injury and worsening renal status.  He was trialed on mirtazapine for appetite stimulant.  Family wants to continue IV antibiotic course for now.  On 12/25 2020 when he had no significant p.o. intake and was not even alert enough to take his medication.  He was started on <5 MLS per hour since his poor p.o. intake.  After further goals of care discussion with palliative care medicine patient was changed to DO NOT RESUSCITATE.  Palliative continues to meet with the patient and the family about goals of care.  Nephrology was consulted given his AKI and they feel it is in the setting of severe dehydration with concomitant hemodynamic changes/hypotension.  Dr. Carolin Sicks is unable to rule out and related antibiotic use.  We have repeated a urinalysis, order bladder  scan and renal ultrasound which has ruled out obstruction.  Patient was given a 500 mL bolus and started on normal saline at 125 MLS per hour.  He is not a candidate for dialysis treatment per nephrology recommendations  11/15/2020: Patient has some facial twitching likely in the setting of his uremia.   Continues to have severe dehydration and has been predisposed to his AKI because of his prior AKI.  They are recommending continuing IV fluid hydration given that they think he still dry and because he has no oral intake.  They also ordered him some albumin IV and recommending strict I's and O's and avoid hypotensive episodes.  His functional status is very poor and he is not a dialysis candidate.  They are recommending continuing half-normal saline at 125 mils per hour and patient is also getting D5W at 75 MLS per hour.  11/16/2020: Patient did not eat any of his meal today and has been off all his medications.  Any continued goals of care discussion and nephrology is continuing his fluids for now given his severe dehydration.  11/17/2020: Not taking in much p.o. intake and keeps refusing his medications.  Palliative care involvement call the patient's son and they did not answer.  Patient's prognosis is extremely poor and he is hospice appropriate and I feel that he will not continue to do well.  We will need to continue addressing goals of care discussion given that he is not eating anything or drink anything.  Renal function is minimally improved from yesterday and nephrology has changed some IV fluids around again and only to D5 half-normal saline at 75 mL's per hour.  11/18/20: Neurology is following creatinine is slowly improving but patient's appetite is still not very good and has not been taking anything orally per nursing.  Nursing was attempting to feed his medications and he was doing a little bit better today than he was but still not eating very much.  Nephrology is giving the patient a dose of albumin today given his low albumin status.  Currently still continuing palliative care goals of care discussion and will attempt to reach the family to update them about the patient's condition  11/19/2020 patient continues not to eat well continues to spit out medications.  Later this afternoon he became  more altered and was only arousable with sternal rub.  Earlier this morning he was arousable with verbal stimuli.  I called the patient's son Jeneen Rinks and told him change of condition.  Jeneen Rinks came by and met with palliative and a decision was made to transition the patient to full comfort measures and residential hospice referral was made to [facility].  Patient is high risk for further decompensation, worsening and even death given his prognosis being extremely poor.  I would expect an in-hospital death if he is not transition to residential hospice next few days.  Palliative care has stopped all his other medications are not in the patient's comfort and he is not been taking anything p.o.  IV fluids were adjusted again by nephrology today.  Assessment & Plan:   Principal Problem:   MSSA bacteremia Active Problems:   Protein-calorie malnutrition, severe ([place])   Encounter for nasogastric (NG) tube placement   Severe sepsis ([place])   Abscess of left hip   Hypernatremia   Abscess   Acute kidney injury (nontraumatic) (HCC)   Dehydration   Elevated liver function tests   Prosthetic hip infection (HCC)   Hyperkalemia   MSSA bacteremia/sepsis secondary  to MSSA left hip abscess with septic shock: Prosthetic hip infection. poA Catheter Associated UTI -Treated with vasopressors and weaned off. -Blood cultures 12/4, MSSA bacteremia -Hip abscess 12/4, MSSA -Urine culture 12/4, MSSA -Repeat blood cultures negative.  Echocardiogram without any vegetation.   -Not a good candidate for operative removal/spacer or multistage surgery. -IR guided abscess drain, continues to drain.  Overall with poor clinical improvement. -Currently remains on cefazolin and rifampicin.  Total 6 weeks of antibiotics planned. -Antibiotic end date 12/03/2020. -IR evaluated and recommending repeating CT scanning when his output from his drain is minimal less than 10 cc/day -Oral intake has been extremely poor throughout the  weekend and has declined his NG tube. -Plan was to transfer to nursing home in next 24 to 48 hours if he is able to eat at least 50% of his meals but calorie count is inaccurate and will need to ensure that patient is adequately meeting his needs; if he is not tolerating p.o. left need to reevaluate goals of care discussion and he may be hospice appropriate as NG tube will not be replaced and PEG tube is not desired. -Patient has been doing good with his listener shakes p.o. 4 times daily and Magic of 3 times daily with meals and multivitamins however calorie count has not been discontinued given lack of nursing documentation -He has been started back on IV fluids and nephrology been consulted for AKI -Palliative care is involved in goals of care discussion and after discussion family wants to continue IV antibiotics at SNF with palliative however if kidney function continues to worsen of care will be recommending hospice house. -Patient has a very high risk of further decompensation and decline his prognosis is extremely poor given that he is not eating or drinking  And since he is further declining I called and spoke with the son when he came and met with palliative.  After further goals of care discussion a decision was made to transition to full comfort measures and a referral has been made to residential hospice at [facility].  All medications not in the patient's comfort and will be discontinued and patient is a very high risk for further decompensation given his poor prognosis and his multiple comorbidities as well as lack of p.o. intake.  SVT and Sinus Tachycardia -Imp nowroving.  Treated with Cardizem.   -Now on Cardizem 60 mg po q8h and Metoprolol Tartrate 50 mg po BID and IV Metoprolol 2.5 IV q6hprn for HR>120 Sustained -Currently HR acceptable ranging from from 77-93 -TSH 1.6.  Acute metabolic encephalopathy in the setting of history of stroke and underlying vascular dementia -He has  not made very much improvement in order course of his hospitalization and was somnolent today -Repeat MRI with no evidence of acute process.  Does have history of vascular dementia.   -Followed by palliative.  -Hospice recommended, family declined and could not make decisions. 12/20, patient started eating some but was not awake enough to take anything orally today.  Dobbhoff tube discontinued. -Initially he has had some improvement in mental status, however overall remains in very debilitated state; number to interact with me today but I cannot understand what he is saying he is very confused.  His albumin was 1.1 today -Calorie count has  Now been discontinued given an adequate documentation by nursing.  Patient is not eating or drinking anything the last few days.  He is more hospice appropriate but will need to continue to have goals of care discussion.  In the interim we will continue with IV fluid hydration with D5 half-normal saline per nephrology recommendations -Was not as awake today when I evaluated him and now after further goals of care discussion he will be transitioned to comfort measures only and all medications will be discontinued that are not in the patient's comfort  Hypocalcemia -Patient's calcium was 7.8 -Albumin was 2.2 -Corrected Calcium is 9.2 -We will continue to monitor and trend given the patient is now being transitioned to full comfort measures  Thrombocytosis -Likely reactive  -Patient's platelet count is improved and now 242 -We will not continue to monitor and trend as patient is transferred to comfort measures only  Hypernatremia, improved -In the setting of dehydration from poor oral intake -Patient's sodium is now 143 -Continue with fluid hydration per nephrology's recommendation but may likely stop given that he is getting transferred to comfort care  Microcytic Anemia -Patient's Hgb/Hct is relatively stable and improved from 8.5/24.6 -> 9.3/27.4 and  today is now 9.2/27.2 -Checked anemia panel showed iron level of 25, U IBC 108, TIBC 133, saturation of 90%, ferritin of 559, folate level of 7.9, vitamin B12 332 -We will not continue to Monitor for S/Sx of Bleeding given that the patient is a patient is  GERD/GI Prophylaxis -Pantoprazole 40 mg po Daily will be discontinued now given that he is to be transferred to comfort measures only  Hypomagnesemia -Mag Level was 2.0 -We will not discharge and transition to measures  Acute Kidney Injury with Hypernatremia, worsened  Metabolic acidosis -Sodium level improved with increasing free water flush.   -Renal function and sodium have both normalized but significantly elevated yesterday -Patient's sodium this morning was 147 and patient's BUNs/creatinine has gone from 11/1.00 on the 23rd December and has significantly worsened and 36/5.67 yesterday when it peaked and is now slowly coming down and repeat today is 29/4.88 -will obtain a urinalysis, urine culture, bladder scan, urine electrolytes as well as renal ultrasound -Nephrology was consulted for further evaluation and have made further adjustments to the fluids and patient is is getting changed to LR given his hyperchloremia however fluids may stop given that he is being transitioned to comfort measures -patient now has a CO2 of 18, anion gap of 10, chloride level of 115 -Avoid nephrotoxic medications, contrast dyes, hypotension and renally dose medications -We will not check any more labs given that he is going be transition to comfort measures and referral has been made for residential hospice at [facility]  Type 2 Diabetes, uncontrolled with Hyperglycemia -> Hypoglycemia -HbA1c 8.1.  -On 12/20, hypoglycemic episodes after stopping tube feeding and continuing on long-acting insulin. -Decrease dose of long-acting insulin, sliding scale insulin to sensitive scale. He is now on Sensitive Novolog SSI AC/HS -If he is not eating well, he will  not need that much insulin and is now on 10 units of Lantus subcu daily -CBGs ranging from 99-152; we will not continue to monitor labs  Dysphagia -Remained with poor mentation again today.  Initially was eating with help but his oral intake is not being properly documented.   -He does not want a PEG tube  -No reinsertion of Dobbhoff tube recommended.   -He is hospice appropriate as he has not been eating at all and residential hospice referral has been made and patient will be going fully comfort measures now  Pressure Injury Pressure Injury 08/07/20 Buttocks Right;Medial;Left Stage 2 -  Partial thickness loss of dermis presenting as a shallow open injury with  a red, pink wound bed without slough. (Active)  08/07/20 1914  Location: Buttocks  Location Orientation: Right;Medial;Left  Staging: Stage 2 -  Partial thickness loss of dermis presenting as a shallow open injury with a red, pink wound bed without slough.  Wound Description (Comments):   Present on Admission: Yes     Pressure Injury 11/02/20 Penis (Active)  11/02/20 0344  Location: Penis (distal meatus)  Location Orientation:   Staging:   Wound Description (Comments):   Present on Admission:    Severe Malnutrition in the Context of Chronic Illness -Estimated body mass index is 21.08 kg/m as calculated from the following:   Height as of this encounter: 6' (1.829 m).   Weight as of this encounter: 70.5 kg.  -Nutrition Problem: Severe Malnutrition -Etiology: chronic illness (Dementia) -Signs/Symptoms: severe fat depletion,severe muscle depletion,percent weight loss (15.4% weight loss in 9 months) -Percent weight loss: 15.4 % (less than 9 months) -Interventions: Tube feeding,Prostat,MVI; TF now discontinued and Panda Tube Removed -Nutritionist consulted for further evaluation and recommendations -C/w 48 hour Calorie Count but she has not been keeping track of his meals appropriately so we will need to extend this out given  the very limited no documentation abdomen completed by nursing staff.  Nutrition was stressing the importance of keeping accurate documentation and it is unclear if the patient is consuming his supplements well.  Will need to make an informed decision of whether he is able to adequately meet his nutritional needs orally we will need to have continued goals of care discussion and possible transfer to hospice if he does not -Nutritionist recommending c/w Glucerna Shake po TID, Magic Cup TIDwm, and MVI Daily; stressed importance of obtaining a calorie count -Patient is not eating very much and documentation of the calorie count has now stopped. -We will continue goals of care discussion and patient's son was updated today by myself and palliative care.  She met with palliative care and after further goals of care we will transition the patient to full comfort measures and stop all interventions not in the patient's comfort.  A referral will be made to residential hospice  DVT prophylaxis: Heparin 5,000 units sq q8h Code Status: DO NOT RESUSCITATE  Family Communication: No family present at bedside and I spoke to the son Jeneen Rinks via telephone he came and met with palliative Disposition Plan: We will be appropriate for residential hospice and referral has been made.  If hospital bed is unavailable anticipate possible in-hospital death if he does not go next few days  Status is: Inpatient  Remains inpatient appropriate because:Unsafe d/c plan, IV treatments appropriate due to intensity of illness or inability to take PO and Inpatient level of care appropriate due to severity of illness   Dispo:  Patient From:  Home  Planned Disposition: Residential HospiceSNF  Expected discharge date: 11/17/2020  Medically stable for discharge:  No  Consultants:   Palliative Care  Infectious Diseases  Interventional Radiology  PCCM Transfer  Orthopedic Surgery  Nephrology   Procedures:  Left hip abscess  s/p abscess drain to the left thigh on 11/07/2020  Antimicrobials: Anti-infectives (From admission, onward)   Start     Dose/Rate Route Frequency Ordered Stop   11/14/20 0300  ceFAZolin (ANCEF) IVPB 1 g/50 mL premix        1 g 100 mL/hr over 30 Minutes Intravenous Every 12 hours 11/13/20 1952     11/08/20 2200  rifampin (RIFADIN) capsule 300 mg  Status:  Discontinued  300 mg Oral Every 12 hours 11/08/20 1319 11/08/20 1322   11/08/20 2200  rifampin (RIFADIN) 25 mg/mL oral suspension 300 mg        300 mg Oral Every 12 hours 11/08/20 1322     11/06/20 2200  rifampin (RIFADIN) 25 mg/mL oral suspension 300 mg  Status:  Discontinued        300 mg Per Tube Every 12 hours 11/06/20 1008 11/08/20 1319   10/26/20 1100  rifampin (RIFADIN) 300 mg in sodium chloride 0.9 % 100 mL IVPB  Status:  Discontinued        300 mg 200 mL/hr over 30 Minutes Intravenous Every 12 hours 10/26/20 1007 11/06/20 1008   10/24/20 0815  ceFAZolin (ANCEF) IVPB 2g/100 mL premix  Status:  Discontinued        2 g 200 mL/hr over 30 Minutes Intravenous Every 8 hours 10/24/20 0804 11/13/20 1952   10/23/20 2200  ceFAZolin (ANCEF) IVPB 2g/100 mL premix  Status:  Discontinued        2 g 200 mL/hr over 30 Minutes Intravenous Every 12 hours 10/23/20 1406 10/24/20 0804   10/22/20 1000  ceFAZolin (ANCEF) IVPB 1 g/50 mL premix  Status:  Discontinued        1 g 100 mL/hr over 30 Minutes Intravenous Every 12 hours 11/11/2020 1745 10/23/20 1406   10/30/2020 2200  ceFAZolin (ANCEF) IVPB 2g/100 mL premix        2 g 200 mL/hr over 30 Minutes Intravenous  Once 10/26/2020 1739 11/14/2020 2239   11/14/2020 2100  cefTRIAXone (ROCEPHIN) 2 g in sodium chloride 0.9 % 100 mL IVPB  Status:  Discontinued        2 g 200 mL/hr over 30 Minutes Intravenous Every 24 hours 10/27/2020 1117 11/01/2020 1129   11/15/2020 1730  linezolid (ZYVOX) IVPB 600 mg  Status:  Discontinued        600 mg 300 mL/hr over 60 Minutes Intravenous Every 12 hours 11/15/2020 1154 11/17/2020  1739   11/12/2020 1130  cefTRIAXone (ROCEPHIN) 2 g in sodium chloride 0.9 % 100 mL IVPB  Status:  Discontinued        2 g 200 mL/hr over 30 Minutes Intravenous Every 24 hours 11/16/2020 1129 11/06/2020 1739   11/07/2020 1000  meropenem (MERREM) 500 mg in sodium chloride 0.9 % 100 mL IVPB  Status:  Discontinued        500 mg 200 mL/hr over 30 Minutes Intravenous Every 12 hours 11/12/2020 0330 11/15/2020 1117   10/31/2020 0400  linezolid (ZYVOX) IVPB 600 mg        600 mg 300 mL/hr over 60 Minutes Intravenous  Once 10/20/2020 0309 11/14/2020 0654   10/24/2020 0215  ceFEPIme (MAXIPIME) 2 g in sodium chloride 0.9 % 100 mL IVPB        2 g 200 mL/hr over 30 Minutes Intravenous  Once 11/02/2020 0212 10/26/2020 0306   10/28/2020 0215  ampicillin-sulbactam (UNASYN) 1.5 g in sodium chloride 0.9 % 100 mL IVPB  Status:  Discontinued        1.5 g 200 mL/hr over 30 Minutes Intravenous  Once 10/27/2020 0212 10/20/2020 0214   10/19/2020 0215  vancomycin (VANCOCIN) IVPB 1000 mg/200 mL premix  Status:  Discontinued        1,000 mg 200 mL/hr over 60 Minutes Intravenous  Once 11/15/2020 2919 11/04/2020 0314        Subjective: Seen and examined at bedside and he is not alert or awake.  Nursing reported again  that he was refusing his medications and oral intake.  Later in the afternoon he became more somnolent and had a harder time being aroused.  Son was updated after further goals of care discussion palliative met with patient's son and he will be transitioned to full comfort measures now.  Referral has been made to [facility] for residential hospice placement.  He is a very high risk of worsening and decompensation if he does not transferred to hospice in the next few days likely in hospital death is imminent.  Objective: Vitals:   11/19/20 0422 11/19/20 0722 11/19/20 1245 11/19/20 1518  BP: 106/70 (!) 153/78 118/61 (!) 123/58  Pulse: 93 93 83 77  Resp: '18 16 15 16  ' Temp: 98.3 F (36.8 C) 97.6 F (36.4 C)    TempSrc: Axillary      SpO2: 100% 100% 100% 100%  Weight:      Height:        Intake/Output Summary (Last 24 hours) at 11/19/2020 1624 Last data filed at 11/19/2020 1600 Gross per 24 hour  Intake 575 ml  Output 1345 ml  Net -770 ml   Filed Weights   11/02/20 0825 11/05/20 0500 11/09/20 0451  Weight: 71.5 kg 72.8 kg 70.5 kg   Examination: Physical Exam:  Constitutional: Patient is a very thin chronically ill-appearing African-American male who is somnolent and difficult to arouse.  Does not arouse easily and has aroused with very painful stimuli. Eyes: Lids are normal. ENMT: External Ears, Nose appear normal. Neck: Appears normal, supple, no cervical masses, normal ROM, no appreciable thyromegaly; no appreciable JVD Respiratory: Diminished to auscultation bilaterally, no wheezing, rales, rhonchi or crackles. Normal respiratory effort and patient is not tachypenic. No accessory muscle use.  Unlabored breathing Cardiovascular: RRR, no murmurs / rubs / gallops. S1 and S2 auscultated.  No appreciable extremity edema Abdomen: Soft, non-tender, non-distended. Bowel sounds positive.  GU: Deferred. Musculoskeletal: No clubbing / cyanosis of digits/nails.  Has a drain in the left hip.  Skin: No rashes, lesions, ulcers on limited skin evaluation I did not turn him to view his sacral ulcer. No induration; Warm and dry.  Neurologic: He is not awake enough for responsive neuro to follow commands; arouses only to extremely painful stimuli Psychiatric: Diminished judgment and insight.  He is not awake or alert and oriented x 3.   Data Reviewed: I have personally reviewed following labs and imaging studies  CBC: Recent Labs  Lab 11/14/20 0832 11/15/20 0238 11/17/20 0737 11/18/20 0258 11/19/20 0158  WBC 7.9 7.0 6.8 7.0 6.0  NEUTROABS 5.5 4.9 4.9 5.0 4.2  HGB 8.1* 9.1* 8.5* 9.3* 9.2*  HCT 23.2* 27.7* 24.6* 27.4* 27.2*  MCV 78.9* 81.2 79.1* 78.5* 77.5*  PLT 343 294 224 236 109   Basic Metabolic Panel: Recent  Labs  Lab 11/15/20 0238 11/16/20 0213 11/17/20 0232 11/18/20 0258 11/19/20 0158  NA 147* 145 138 141 143  K 4.2 3.2* 3.8 3.5 3.8  CL 118* 116* 110 114* 115*  CO2 19* 17* 16* 18* 18*  GLUCOSE 129* 79 160* 127* 159*  BUN 36* 34* 33* 31* 29*  CREATININE 5.67* 5.54* 5.35* 5.11* 4.88*  CALCIUM 7.3* 7.6* 7.6* 7.6* 7.8*  MG 2.0 1.9 1.6* 1.8 2.0  PHOS 4.2 3.8 4.0 3.7 3.8   GFR: Estimated Creatinine Clearance: 12.4 mL/min (A) (by C-G formula based on SCr of 4.88 mg/dL (H)). Liver Function Tests: Recent Labs  Lab 11/15/20 3235 11/16/20 5732 11/17/20 0232 11/18/20 0258 11/19/20 0158  AST '18 16 26 24 ' 34  ALT <5 <5 <5 5 <5  ALKPHOS 194* 137* 166* 166* 162*  BILITOT 0.4 0.2* 0.5 0.3 0.7  PROT 7.2 6.9 6.6 6.7 6.9  ALBUMIN 1.1* 2.7* 1.9* 1.8* 2.2*   No results for input(s): LIPASE, AMYLASE in the last 168 hours. No results for input(s): AMMONIA in the last 168 hours. Coagulation Profile: No results for input(s): INR, PROTIME in the last 168 hours. Cardiac Enzymes: No results for input(s): CKTOTAL, CKMB, CKMBINDEX, TROPONINI in the last 168 hours. BNP (last 3 results) No results for input(s): PROBNP in the last 8760 hours. HbA1C: No results for input(s): HGBA1C in the last 72 hours. CBG: Recent Labs  Lab 11/18/20 1139 11/18/20 1655 11/18/20 2242 11/19/20 0815 11/19/20 1203  GLUCAP 110* 130* 148* 152* 142*   Lipid Profile: No results for input(s): CHOL, HDL, LDLCALC, TRIG, CHOLHDL, LDLDIRECT in the last 72 hours. Thyroid Function Tests: No results for input(s): TSH, T4TOTAL, FREET4, T3FREE, THYROIDAB in the last 72 hours. Anemia Panel: No results for input(s): VITAMINB12, FOLATE, FERRITIN, TIBC, IRON, RETICCTPCT in the last 72 hours. Sepsis Labs: No results for input(s): PROCALCITON, LATICACIDVEN in the last 168 hours.  Recent Results (from the past 240 hour(s))  Resp Panel by RT-PCR (Flu A&B, Covid) Nasopharyngeal Swab     Status: None   Collection Time: 11/10/20   3:16 AM   Specimen: Nasopharyngeal Swab; Nasopharyngeal(NP) swabs in vial transport medium  Result Value Ref Range Status   SARS Coronavirus 2 by RT PCR NEGATIVE NEGATIVE Final    Comment: (NOTE) SARS-CoV-2 target nucleic acids are NOT DETECTED.  The SARS-CoV-2 RNA is generally detectable in upper respiratory specimens during the acute phase of infection. The lowest concentration of SARS-CoV-2 viral copies this assay can detect is 138 copies/mL. A negative result does not preclude SARS-Cov-2 infection and should not be used as the sole basis for treatment or other patient management decisions. A negative result may occur with  improper specimen collection/handling, submission of specimen other than nasopharyngeal swab, presence of viral mutation(s) within the areas targeted by this assay, and inadequate number of viral copies(<138 copies/mL). A negative result must be combined with clinical observations, patient history, and epidemiological information. The expected result is Negative.  Fact Sheet for Patients:  EntrepreneurPulse.com.au  Fact Sheet for Healthcare Providers:  IncredibleEmployment.be  This test is no t yet approved or cleared by the [place] FDA and  has been authorized for detection and/or diagnosis of SARS-CoV-2 by FDA under an Emergency Use Authorization (EUA). This EUA will remain  in effect (meaning this test can be used) for the duration of the COVID-19 declaration under Section 564(b)(1) of the Act, 21 U.S.C.section 360bbb-3(b)(1), unless the authorization is terminated  or revoked sooner.       Influenza A by PCR NEGATIVE NEGATIVE Final   Influenza B by PCR NEGATIVE NEGATIVE Final    Comment: (NOTE) The Xpert Xpress SARS-CoV-2/FLU/RSV plus assay is intended as an aid in the diagnosis of influenza from Nasopharyngeal swab specimens and should not be used as a sole basis for treatment. Nasal washings and aspirates  are unacceptable for Xpert Xpress SARS-CoV-2/FLU/RSV testing.  Fact Sheet for Patients: EntrepreneurPulse.com.au  Fact Sheet for Healthcare Providers: IncredibleEmployment.be  This test is not yet approved or cleared by the [place] FDA and has been authorized for detection and/or diagnosis of SARS-CoV-2 by FDA under an Emergency Use Authorization (EUA). This EUA will remain in effect (meaning this test can be  used) for the duration of the COVID-19 declaration under Section 564(b)(1) of the Act, 21 U.S.C. section 360bbb-3(b)(1), unless the authorization is terminated or revoked.  Performed at [facility], [place] [address] [place], [place] [address]   Culture, Urine     Status: Abnormal   Collection Time: 11/14/20 10:37 AM   Specimen: Urine, Random  Result Value Ref Range Status   Specimen Description URINE, RANDOM  Final   Special Requests   Final    NONE Performed at [facility], [place] [address] [place], [place] [address]    Culture >=100,000 COLONIES/mL YEAST (A)  Final   Report Status 11/15/2020 FINAL  Final     RN Pressure Injury Documentation: Pressure Injury 08/07/20 Buttocks Right;Medial;Left Stage 2 -  Partial thickness loss of dermis presenting as a shallow open injury with a red, pink wound bed without slough. (Active)  08/07/20 1914  Location: Buttocks  Location Orientation: Right;Medial;Left  Staging: Stage 2 -  Partial thickness loss of dermis presenting as a shallow open injury with a red, pink wound bed without slough.  Wound Description (Comments):   Present on Admission: Yes     Pressure Injury 11/02/20 Penis (Active)  11/02/20 0344  Location: Penis  Location Orientation:   Staging:   Wound Description (Comments):   Present on Admission:      Estimated body mass index is 21.08 kg/m as calculated from the following:   Height as of this encounter: 6' (1.829 m).   Weight as of this encounter: 70.5  kg.  Malnutrition Type:  Nutrition Problem: Severe Malnutrition Etiology: chronic illness (dementia)   Malnutrition Characteristics:  Signs/Symptoms: severe fat depletion,severe muscle depletion,percent weight loss (15.4% weight loss in 9 months) Percent weight loss: 15.4 % (less than 9 months)   Nutrition Interventions:  Interventions: Glucerna shake,MVI,Magic cup,Calorie Count    Radiology Studies: No results found.  Scheduled Meds: . brimonidine  1 drop Both Eyes TID  . Chlorhexidine Gluconate Cloth  6 each Topical Daily  . diltiazem  60 mg Oral Q8H  . feeding supplement (GLUCERNA SHAKE)  237 mL Oral TID PC & HS  . heparin injection (subcutaneous)  5,000 Units Subcutaneous Q8H  . insulin aspart  0-5 Units Subcutaneous QHS  . insulin aspart  0-9 Units Subcutaneous TID WC  . insulin glargine  10 Units Subcutaneous QHS  . latanoprost  1 drop Both Eyes QHS  . metoprolol tartrate  50 mg Oral BID  . metoprolol tartrate  5 mg Intravenous Once  . mirtazapine  7.5 mg Oral QHS  . multivitamin with minerals  1 tablet Oral Daily  . mupirocin ointment   Topical BID  . pantoprazole sodium  40 mg Oral Daily  . rifampin  300 mg Oral Q12H  . sodium chloride flush  5 mL Intracatheter Q8H   Continuous Infusions: . sodium chloride 250 mL (11/01/20 2145)  .  ceFAZolin (ANCEF) IV 1 g (11/19/20 1502)  . dextrose 5% lactated ringers 75 mL/hr at 11/19/20 0941    LOS: 29 days   Kerney Elbe, DO [facility] PAGER is on [place]  If 7PM-7AM, please contact night-coverage [email or web address]

## 2020-11-19 NOTE — Progress Notes (Signed)
1510 Pt is not responding to voice or touch. V/S w/in normal.  Notified Dr Alfredia Ferguson.  Palliative was re-consulted.  51 Pt's family came in and met with Palliative. Now pt is on full comfort care. 1800 Pt appears to be comfortable, no labored breathing noted.

## 2020-11-19 NOTE — Progress Notes (Signed)
[facility] NEPHROLOGY PROGRESS NOTE  Assessment/ Plan: Pt is a 80 y.o. yo male  with past medical history of stroke with residual left-sided hemiplegia, vascular dementia, hypertension, DM, dysphagia, HLD, chronic urinary retention on indwelling Foley catheter due to BPH, recent left femoral neck fracture required hemiarthroplasty in 07/2020 admitted on 12/4 from SNF for the concern of UTI, sepsis seen as a consultation for the evaluation of AKI.  #Acute kidney injury, nonoliguric: Likely dehydration with ATN.  Recurrent AKI in the past.  Chronic bladder outlet obstruction.  Baseline normal kidney function.  Cannot rule out AIN but less likely. -Poor oral intake, ongoing confusion -Improving urine output and creatinine -Strict ins and out, avoid hypotensive episode or nephrotoxins. -Given severe dementia, very poor functional status and multiple comorbidities, he is not a candidate for dialysis treatment.  Noted he is DNR and he was seen by palliative care team. - switch to LR half given hyperchloremia - Likely sign off soon if Crt continues to slowly improve  #Metabolic acidosis: likely 2/2 AKI and fluids. 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  Blood culture growing MSSA and currently on cefazolin and rifampicin.  Echo without any vegetation.  Abx per primary team  #Severe vascular dementia, stroke and may have some acute metabolic encephalopathy: MRI of the brain with chronic infarction and cerebral atrophy.  Continue supportive and palliative care.  #Severe protein calorie malnutrition, chronic dysphagia: Very poor oral intake per nursing staff.  Per report he declined feeding tube placement.  IV fluid for hydration.   Subjective: Does not respond to quetioning. Crt improving and UOP adequate. Objective Vital signs in last 24 hours: Vitals:   11/18/20 1313  11/18/20 1958 11/19/20 0422 11/19/20 0722  BP: 112/64 136/72 106/70 (!) 153/78  Pulse: 73 80 93 93  Resp: 16 17 18 16   Temp: 97.9 F (36.6 C) 98.3 F (36.8 C) 98.3 F (36.8 C) 97.6 F (36.4 C)  TempSrc:  Axillary Axillary   SpO2: 100% 100% 100% 100%  Weight:      Height:       Weight change:   Intake/Output Summary (Last 24 hours) at 11/19/2020 0918 Last data filed at 11/19/2020 0426 Gross per 24 hour  Intake 0 ml  Output 1745 ml  Net -1745 ml       Labs: Basic Metabolic Panel: Recent Labs  Lab 11/17/20 0232 11/18/20 0258 11/19/20 0158  NA 138 141 143  K 3.8 3.5 3.8  CL 110 114* 115*  CO2 16* 18* 18*  GLUCOSE 160* 127* 159*  BUN 33* 31* 29*  CREATININE 5.35* 5.11* 4.88*  CALCIUM 7.6* 7.6* 7.8*  PHOS 4.0 3.7 3.8   Liver Function Tests: Recent Labs  Lab 11/17/20 0232 11/18/20 0258 11/19/20 0158  AST 26 24 34  ALT <5 5 <5  ALKPHOS 166* 166* 162*  BILITOT 0.5 0.3 0.7  PROT 6.6 6.7 6.9  ALBUMIN 1.9* 1.8* 2.2*   No results for input(s): LIPASE, AMYLASE in the last 168 hours. No results for input(s): AMMONIA in the last 168 hours. CBC: Recent Labs  Lab 11/14/20 0832 11/15/20 0238 11/17/20 0737 11/18/20 0258 11/19/20 0158  WBC 7.9 7.0 6.8 7.0 6.0  NEUTROABS 5.5 4.9 4.9 5.0 4.2  HGB 8.1* 9.1* 8.5* 9.3* 9.2*  HCT 23.2* 27.7* 24.6* 27.4* 27.2*  MCV 78.9* 81.2 79.1* 78.5* 77.5*  PLT 343 294 224 236 242  Cardiac Enzymes: No results for input(s): CKTOTAL, CKMB, CKMBINDEX, TROPONINI in the last 168 hours. CBG: Recent Labs  Lab 11/18/20 0558 11/18/20 1139 11/18/20 1655 11/18/20 2242 11/19/20 0815  GLUCAP 99 110* 130* 148* 152*    Iron Studies: No results for input(s): IRON, TIBC, TRANSFERRIN, FERRITIN in the last 72 hours. Studies/Results: No results found.  Medications: Infusions: . sodium chloride 250 mL (11/01/20 2145)  .  ceFAZolin (ANCEF) IV 1 g (11/19/20 0434)  . dextrose 5 % and 0.45% NaCl 75 mL/hr at 11/18/20 1638    Scheduled  Medications: . brimonidine  1 drop Both Eyes TID  . Chlorhexidine Gluconate Cloth  6 each Topical Daily  . diltiazem  60 mg Oral Q8H  . feeding supplement (GLUCERNA SHAKE)  237 mL Oral TID PC & HS  . heparin injection (subcutaneous)  5,000 Units Subcutaneous Q8H  . insulin aspart  0-5 Units Subcutaneous QHS  . insulin aspart  0-9 Units Subcutaneous TID WC  . insulin glargine  10 Units Subcutaneous QHS  . latanoprost  1 drop Both Eyes QHS  . metoprolol tartrate  50 mg Oral BID  . metoprolol tartrate  5 mg Intravenous Once  . mirtazapine  7.5 mg Oral QHS  . multivitamin with minerals  1 tablet Oral Daily  . mupirocin ointment   Topical BID  . pantoprazole sodium  40 mg Oral Daily  . rifampin  300 mg Oral Q12H  . sodium chloride flush  5 mL Intracatheter Q8H    have reviewed scheduled and prn medications.  Physical Exam: General:  chronically ill-appearing, lying in bed, no distress Heart: Normal rate, no audible murmurs Lungs: Bilateral chest rise, no increased work of breathing Abdomen:soft, Non-tender, non-distended Extremities:No LE edema Neurology: awake and alert but not interactive  Jaimin J Ashima Shrake 11/19/2020,9:18 AM  LOS: 29 days

## 2020-11-19 NOTE — Progress Notes (Signed)
Daily Progress Note   Patient Name: Marcus Downs.       Date: 11/19/2020 DOB: 24-Aug-1941  Age: 80 y.o. MRN#: [id number] Attending Physician: Kerney Elbe, DO Primary Care Physician: Ria Bush, MD Admit Date: 10/30/2020  Reason for Consultation/Follow-up: Establishing goals of care  Subjective: Notified by Dr. Alfredia Ferguson of patient's worsening status. On eval- patient does not wake to my voice or touch. He is unresponsive to pain. He has not eaten, had drink, and has not been able to take oral medication for several days. He appears to be dying. Patient has failed to improve significantly with current interventions. He is now very lethargic and and is no longer eating or drinking. Given his lack of oral intake it is likely his acute kidney injury will quickly recur and he he will continue to decline towards end of life.  Met with patient's family including his two sons Jeneen Rinks and [place]. We discussed above and they shared what they were told by Dr. Alfredia Ferguson.  We discussed option of transition to full comfort measures only and requesting placement at hospice facility. All family were in agreement with transition to comfort care and referral for in patient  Hospice.   Review of Systems  Unable to perform ROS: Mental status change  All other systems reviewed and are negative.   Length of Stay: 29  Current Medications: Scheduled Meds:  . Chlorhexidine Gluconate Cloth  6 each Topical Daily  . metoprolol tartrate  5 mg Intravenous Once    Continuous Infusions:   PRN Meds: acetaminophen **OR** acetaminophen, HYDROmorphone (DILAUDID) injection, oxyCODONE  Physical Exam Vitals and nursing note reviewed.  Constitutional:      Appearance: He is ill-appearing.  Pulmonary:      Effort: Pulmonary effort is normal.  Neurological:     Comments: Does not respond to my touch or voice             Vital Signs: BP (!) 123/58 (BP Location: Right Arm)   Pulse 77   Temp 97.6 F (36.4 C)   Resp 16   Ht 6' (1.829 m)   Wt 70.5 kg   SpO2 100%   BMI 21.08 kg/m  SpO2: SpO2: 100 % O2 Device: O2 Device: Room Air O2 Flow Rate:    Intake/output summary:   Intake/Output Summary (  Last 24 hours) at 11/19/2020 1638 Last data filed at 11/19/2020 1600 Gross per 24 hour  Intake 575 ml  Output 1345 ml  Net -770 ml   LBM: Last BM Date: 11/13/20 Baseline Weight: Weight: 70 kg Most recent weight: Weight: 70.5 kg       Palliative Assessment/Data: PPS: 10%    Flowsheet Rows   Flowsheet Row Most Recent Value  Intake Tab   Referral Department Hospitalist  Unit at Time of Referral Intermediate Care Unit  Date Notified 10/30/20  Palliative Care Type New Palliative care  Reason for referral Clarify Goals of Care, Counsel Regarding Hospice  Date of Admission 11/16/2020  Date first seen by Palliative Care 10/30/20  # of days Palliative referral response time 0 Day(s)  # of days IP prior to Palliative referral 9  Clinical Assessment   Psychosocial & Spiritual Assessment   Palliative Care Outcomes       Patient Active Problem List   Diagnosis Date Noted  . Terminal care 11/19/2020  . Hyperkalemia   . Abscess   . Acute kidney injury (nontraumatic) ([place])   . Dehydration   . Elevated liver function tests   . Prosthetic hip infection ([place])   . MSSA bacteremia 10/23/2020  . Abscess of left hip   . Hypernatremia   . Severe sepsis ([place]) 11/06/2020  . Dysphagia as late effect of stroke 08/22/2020  . Urinary retention 08/22/2020  . Tachycardia   . Status post hip hemiarthroplasty 08/07/2020  . S/P hip hemiarthroplasty 08/07/2020  . Pressure injury of skin 08/07/2020  . Closed left hip fracture ([place]) 08/06/2020  . Elevated alkaline phosphatase level 11/01/2019  . Type 2  diabetes mellitus with vascular disease ([place]) 08/16/2019  . Diabetes mellitus with cataract ([place]) 08/05/2019  . Weight loss 04/30/2019  . Iron deficiency anemia 04/29/2018  . Pedal edema 07/11/2017  . Primary open angle glaucoma   . Encounter for nasogastric (NG) tube placement 02/20/2015  . Benign prostatic hyperplasia 02/20/2015  . Falls 11/08/2013  . Protein-calorie malnutrition, severe ([place]) 11/08/2013  . Medicare annual wellness visit, subsequent 05/25/2012  . Hemiplegia, late effect of cerebrovascular disease ([place]) 12/20/2010  . Pain due to onychomycosis of toenails of both feet 09/10/2010  . Renal insufficiency 04/13/2009  . GERD 03/30/2009  . Controlled diabetes mellitus type 2 with complications ([place]) 83/41/9622  . Hyperlipidemia associated with type 2 diabetes mellitus ([place]) 05/04/2007  . Essential hypertension 05/04/2007  . SYMPTOM, INCONTINENCE, MIXED, URGE/STRESS 05/04/2007  . Ex-smoker 05/04/2007    Palliative Care Assessment & Plan   Patient Profile: 80 y.o.malewith past medical history of stroke, vascular dementia, hypertension, hyperlipidemia, diabetes, GERD, dysphagia, chronic urinary retention with indwelling catheter due to BPH, L hemiarthroplasty 09/2021admitted on 12/4/2021from SNFwithUTI and sepsis.CT A/P found left hip abscess with MSSA bacteremia. Overall prognosis poor. Palliative care requested to continue goals of care conversations with family. He has failed to thrive, no longer eating or drinking.  Assessment/Recommendations/Plan   Transition to full comfort measures only  TOC referral to request eval for placement at [facility] and Additional Recommendations:  Limitations on Scope of Treatment: Full Comfort Care  Code Status:  DNR  Prognosis:   < 2 weeks  Discharge Planning:  Hospice facility  Care plan was discussed with patient's family and care team.  Thank you for allowing the Palliative Medicine Team to  assist in the care of this patient.   Total time: 42 minutes Greater than 50%  of this  time was spent counseling and coordinating care related to the above assessment and plan.  Mariana Kaufman, AGNP-C Palliative Medicine   Please contact Palliative Medicine Team phone at [phone] for questions and concerns.

## 2020-11-20 DIAGNOSIS — R638 Other symptoms and signs concerning food and fluid intake: Secondary | ICD-10-CM

## 2020-11-20 DIAGNOSIS — A419 Sepsis, unspecified organism: Secondary | ICD-10-CM | POA: Diagnosis not present

## 2020-11-20 DIAGNOSIS — E875 Hyperkalemia: Secondary | ICD-10-CM

## 2020-11-20 DIAGNOSIS — L02416 Cutaneous abscess of left lower limb: Secondary | ICD-10-CM | POA: Diagnosis not present

## 2020-11-20 DIAGNOSIS — R6521 Severe sepsis with septic shock: Secondary | ICD-10-CM

## 2020-11-20 DIAGNOSIS — Z515 Encounter for palliative care: Secondary | ICD-10-CM | POA: Diagnosis not present

## 2020-11-20 DIAGNOSIS — L0291 Cutaneous abscess, unspecified: Secondary | ICD-10-CM | POA: Diagnosis not present

## 2020-11-20 DIAGNOSIS — R7881 Bacteremia: Secondary | ICD-10-CM | POA: Diagnosis not present

## 2020-11-20 NOTE — TOC Progression Note (Signed)
Transition of Care (TOC) - Progression Note    Patient Details  Name: Matti Minney. MRN: [id number] Date of Birth: September 25, 1941  Transition of Care [facility]) CM/SW Contact  Bess Kinds, RN Phone Number: [phone] 11/20/2020, 10:05 AM  Clinical Narrative:     Acknowledging [facility] consult for referral to [facility]. Contacted [place] at [employer]. Referral placed. TOC following for transition needs.   Expected Discharge Plan: Hospice Medical Facility Barriers to Discharge: Hospice Bed not available  Expected Discharge Plan and Services Expected Discharge Plan: Hospice Medical Facility In-house Referral: NA Discharge Planning Services: CM Consult   Living arrangements for the past 2 months: Single Family Home                 DME Arranged: N/A DME Agency: NA       HH Arranged: NA HH Agency: NA         Social Determinants of Health (SDOH) Interventions    Readmission Risk Interventions Readmission Risk Prevention Plan 11/01/2020  Transportation Screening Complete  PCP or Specialist Appt within 3-5 Days Complete  HRI or Home Care Consult Not Complete  HRI or Home Care Consult comments n/a returning back to snf  Social Work Consult for Recovery Care Planning/Counseling Complete  Palliative Care Screening Complete  Medication Review [facility]) Referral to Pharmacy  Some recent data might be hidden

## 2020-11-20 NOTE — Plan of Care (Signed)

## 2020-11-20 NOTE — Plan of Care (Signed)

## 2020-11-20 NOTE — Progress Notes (Signed)
Nutrition Brief Note  Chart reviewed. Pt now transitioning to comfort care.  No further nutrition interventions warranted at this time.  Please re-consult as needed.   Alassane Kalafut W, RD, LDN, CDCES Registered Dietitian II Certified Diabetes Care and Education Specialist Please refer to AMION for RD and/or RD on-call/weekend/after hours pager  

## 2020-11-20 NOTE — Progress Notes (Signed)
[facility] ([facility]) Hospital Liaison note.    Received request from TOC manager for family interest in [facility]. [facility] is unable to offer a room today. Hospital Liaison will follow up tomorrow or sooner if a room becomes available and eligibility is confirmed.   A Please do not hesitate to call with questions.    Thank you,   Mary Anne Robertson, RN, CCM      [facility] Hospital Liaison (listed on AMION under Hospice /[facility])    [phone] 

## 2020-11-20 NOTE — Progress Notes (Signed)
PROGRESS NOTE    Marcus Downs.  MHW:[id number] DOB: [birth year] DOA: 11/03/2020 PCP: Ria Bush, MD   Brief Narrative:  Patient is a 80 year old African-American male with a past medical history significant for but not limited to vascular dementia, previous CVA with residual left-sided hemiplegia, essential hypertension, type 2 diabetes mellitus, dysphagia and hyperlipidemia as well as chronic urine retention with indwelling Foley catheter secondary to BPH and recent left femoral neck fracture status post left hemiarthroplasty on 08/08/2020 who presented from his nursing facility with altered mental status, agitation and acute kidney injury with electrolyte abnormalities with hyponatremia.  He was in septic shock and found to have an abscess around his arthroplasty hardware and an MSSA bacteremia and was deemed not a surgical candidate.  He was empirically treated with IV antibiotics for his MSSA infection and ended up having a left hip drain. He was transferred back to [facility] service 10/24/20. Overall his prognosis and clinical status remained poor and my colleagues have had multiple conversations with family about palliative care or hospice and family not ready yet.  On 11/08/2020 is pain to tube was removed and he was eating about 50% of his meals but this is not been adequately documented.  We will ask for a calorie count to see if the patient actually nutrition orally.  If he does not he may be hospice appropriate.  Palliative care following appreciate continue goals of care discussion.   Interim History Intermittently he has not been awake enough to participate in Examination and so ? How much he is really taking in orally. IR evaluated and patient's left hip abscess drain is intact and there can continue to follow and recommending a repeat CT scan when his output is minimal and less than 10 cc daily.   On 11/09/2020 patient had better intake with his Magic cup and will start obstruction.   Calorie count was extended for another day.  Lantus was added for the patient on 11/10/2020.  On day 3 of his calorie count patient continued to do well with Magic cup with excellent consumption even though meal consumption was poor.  Patient blood sugars were in 300 so they continue with same as opposed to more nutritionally dense Ensure supplements.  Nutritionist recommended trialing Nucynta a fourth time to help patient meet his calorie count and protein needs.  Calorie count was continued throughout the weekend and per facility he needed to eat at least 90% of his meals prior to discharge but unfortunately no meal documentation was obtained over the weekend and nutritionist was unable to provide any additional information regarding patient's p.o consumption.Marland Kitchen  He did not want another PEG tube Dobbhoff.  In the interim patient intermittent acute kidney injury and worsening renal status.  He was trialed on mirtazapine for appetite stimulant.  Family wants to continue IV antibiotic course for now.  On 12/25 2020 when he had no significant p.o. intake and was not even alert enough to take his medication.  He was started on <5 MLS per hour since his poor p.o. intake.  After further goals of care discussion with palliative care medicine patient was changed to DO NOT RESUSCITATE.  Palliative continues to meet with the patient and the family about goals of care.  Nephrology was consulted given his AKI and they feel it is in the setting of severe dehydration with concomitant hemodynamic changes/hypotension.  Dr. Carolin Sicks is unable to rule out and related antibiotic use.  We have repeated a urinalysis, order bladder  scan and renal ultrasound which has ruled out obstruction.  Patient was given a 500 mL bolus and started on normal saline at 125 MLS per hour.  He is not a candidate for dialysis treatment per nephrology recommendations  11/15/2020: Patient has some facial twitching likely in the setting of his uremia.   Continues to have severe dehydration and has been predisposed to his AKI because of his prior AKI.  They are recommending continuing IV fluid hydration given that they think he still dry and because he has no oral intake.  They also ordered him some albumin IV and recommending strict I's and O's and avoid hypotensive episodes.  His functional status is very poor and he is not a dialysis candidate.  They are recommending continuing half-normal saline at 125 mils per hour and patient is also getting D5W at 75 MLS per hour.  11/16/2020: Patient did not eat any of his meal today and has been off all his medications.  Any continued goals of care discussion and nephrology is continuing his fluids for now given his severe dehydration.  11/17/2020: Not taking in much p.o. intake and keeps refusing his medications.  Palliative care involvement call the patient's son and they did not answer.  Patient's prognosis is extremely poor and he is hospice appropriate and I feel that he will not continue to do well.  We will need to continue addressing goals of care discussion given that he is not eating anything or drink anything.  Renal function is minimally improved from yesterday and nephrology has changed some IV fluids around again and only to D5 half-normal saline at 75 mL's per hour.  11/18/20: Neurology is following creatinine is slowly improving but patient's appetite is still not very good and has not been taking anything orally per nursing.  Nursing was attempting to feed his medications and he was doing a little bit better today than he was but still not eating very much.  Nephrology is giving the patient a dose of albumin today given his low albumin status.  Currently still continuing palliative care goals of care discussion and will attempt to reach the family to update them about the patient's condition  11/19/2020 patient continues not to eat well continues to spit out medications.  Later this afternoon he became  more altered and was only arousable with sternal rub.  Earlier this morning he was arousable with verbal stimuli.  I called the patient's son Jeneen Rinks and told him change of condition.  Jeneen Rinks came by and met with palliative and a decision was made to transition the patient to full comfort measures and residential hospice referral was made to [facility].  Patient is high risk for further decompensation, worsening and even death given his prognosis being extremely poor.  I would expect an in-hospital death if he is not transition to residential hospice next few days.  Palliative care has stopped all his other medications are not in the patient's comfort and he is not been taking anything p.o.  IV fluids were adjusted again by nephrology today.  11/20/2020: Remains Comfortable and in NAD. Referral has been made for [facility] and awaiting placement.   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 poA Catheter Associated UTI -Treated with vasopressors and weaned off. -Blood cultures 12/4, MSSA bacteremia -Hip abscess 12/4, MSSA -Urine culture 12/4, MSSA -Repeat blood cultures negative.  Echocardiogram without any vegetation.   -Not a good candidate for operative removal/spacer or multistage surgery. -IR guided abscess drain, continues to drain.  Overall with poor clinical improvement. -Currently remains on cefazolin and rifampicin.  Total 6 weeks of antibiotics planned. -Antibiotic end date 12/03/2020. -IR evaluated and recommending repeating CT  scanning when his output from his drain is minimal less than 10 cc/day -Oral intake has been extremely poor throughout the weekend and has declined his NG tube. -Plan was to transfer to nursing home in next 24 to 48 hours if he is able to eat at least 50% of his meals but calorie count is inaccurate and will need to ensure that patient is adequately meeting his needs; if he is not tolerating p.o. left need to reevaluate goals of care discussion and he may be hospice appropriate as NG tube will not be replaced and PEG tube is not desired. -Patient has been doing good with his listener shakes p.o. 4 times daily and Magic of 3 times daily with meals and multivitamins however calorie count has not been discontinued given lack of nursing documentation -He has been started back on IV fluids and nephrology been consulted for AKI -Palliative care is involved in goals of care discussion and after discussion family wants to continue IV antibiotics at SNF with palliative however if kidney function continues to worsen of care will be recommending hospice house. -Patient has a very high risk of further decompensation and decline his prognosis is extremely poor given that he is not eating or drinking  And since he is further declining I called and spoke with the son when he came and met with palliative.  After further goals of care discussion a decision was made to transition to full comfort measures and a referral has been made to residential hospice at [facility].  All medications not in the patient's comfort and will be discontinued and patient is a very high risk for further decompensation given his poor prognosis and his multiple comorbidities as well as lack of p.o. intake.  SVT and Sinus Tachycardia -Imp nowroving.  Treated with Cardizem.   -Now on Cardizem 60 mg po q8h and Metoprolol Tartrate 50 mg po BID and IV Metoprolol 2.5 IV q6hprn for HR>120 Sustained -Currently HR acceptable ranging from from 77-93;  102 documented this AM -TSH 1.6.  Acute metabolic encephalopathy in the setting of history of stroke and underlying vascular dementia -He has not made very much improvement in order course of his hospitalization and was somnolent today -Repeat MRI with no evidence of acute process.  Does have history of vascular dementia.   -Followed by palliative.  -Hospice recommended, family declined and could not make decisions. 12/20, patient started eating some but was not awake enough to take anything orally today.  Dobbhoff tube discontinued. -Initially he has had some improvement in mental status, however overall remains in very debilitated state; number to interact with me today but I cannot understand what he is saying he is very confused.  His albumin was 1.1 today -Calorie count has  Now been discontinued given an adequate documentation by nursing.  Patient is  not eating or drinking anything the last few days.  He is more hospice appropriate but will need to continue to have goals of care discussion.  In the interim we will continue with IV fluid hydration with D5 half-normal saline per nephrology recommendations -Was not as awake today when I evaluated him and now after further goals of care discussion he will be transitioned to comfort measures only and all medications will be discontinued that are not in the patient's comfort  Hypocalcemia -Patient's calcium was 7.8 yesterday  -Albumin was 2.2 -Corrected Calcium is 9.2 yesterday  -We will continue to monitor and trend given the patient is now being transitioned to full comfort measures  Thrombocytosis -Likely reactive  -Patient's platelet count is improved and now 242 -We will not continue to monitor and trend as patient is transferred to comfort measures only  Hypernatremia, improved -In the setting of dehydration from poor oral intake -Patient's sodium was 143 on last check  -Continue with fluid hydration per nephrology's recommendation  but may likely stop given that he is getting transferred to comfort care  Microcytic Anemia -Patient's Hgb/Hct is relatively stable and improved from 8.5/24.6 -> 9.3/27.4 and yesterday is now 9.2/27.2 -Checked anemia panel showed iron level of 25, U IBC 108, TIBC 133, saturation of 90%, ferritin of 559, folate level of 7.9, vitamin B12 332 -We will not continue to Monitor for S/Sx of Bleeding given that the patient is now Comfort Measures  GERD/GI Prophylaxis -Pantoprazole 40 mg po Daily will be discontinued now given that he is to be transferred to comfort measures only  Hypomagnesemia -Mag Level was 2.0 -We will not continue to Monitor as he is Comfort Measures  Acute Kidney Injury with Hypernatremia, worsened  Metabolic acidosis -Sodium level improved with increasing free water flush.   -Renal function and sodium have both normalized but significantly elevated yesterday -Patient's sodium this morning was 147 and patient's BUNs/creatinine has gone from 11/1.00 on the 23rd December and has significantly worsened and 36/5.67 when it peaked and is now slowly coming down and repeat yesterday was 29/4.88 -will obtain a urinalysis, urine culture, bladder scan, urine electrolytes as well as renal ultrasound -Nephrology was consulted for further evaluation and have made further adjustments to the fluids and patient is is getting changed to LR given his hyperchloremia however fluids have now stopped given that he is being transitioned to comfort measures -patient now has a CO2 of 18, anion gap of 10, chloride level of 115 -Avoid nephrotoxic medications, contrast dyes, hypotension and renally dose medications -We will not check any more labs given that he is going be transition to comfort measures and referral has been made for residential hospice at [facility]  Type 2 Diabetes, uncontrolled with Hyperglycemia -> Hypoglycemia -HbA1c 8.1.  -On 12/20, hypoglycemic episodes after stopping tube  feeding and continuing on long-acting insulin. -Decrease dose of long-acting insulin, sliding scale insulin to sensitive scale. He is now on Sensitive Novolog SSI AC/HS -If he is not eating well, he will not need that much insulin and is now on 10 units of Lantus subcu daily -CBGs ranging from 99-152; we will not continue to monitor labs  Dysphagia -Remained with poor mentation again today.  Initially was eating with help but his oral intake is not being properly documented.   -He does not want a PEG tube  -No reinsertion of Dobbhoff tube recommended.   -He is hospice appropriate as he has not been eating at all and residential hospice  referral has been made and patient will be going fully comfort measures now  Pressure Injury Pressure Injury 08/07/20 Buttocks Right;Medial;Left Stage 2 -  Partial thickness loss of dermis presenting as a shallow open injury with a red, pink wound bed without slough. (Active)  08/07/20 1914  Location: Buttocks  Location Orientation: Right;Medial;Left  Staging: Stage 2 -  Partial thickness loss of dermis presenting as a shallow open injury with a red, pink wound bed without slough.  Wound Description (Comments):   Present on Admission: Yes     Pressure Injury 11/02/20 Penis (Active)  11/02/20 0344  Location: Penis (distal meatus)  Location Orientation:   Staging:   Wound Description (Comments):   Present on Admission:    Severe Malnutrition in the Context of Chronic Illness -Estimated body mass index is 21.08 kg/m as calculated from the following:   Height as of this encounter: 6' (1.829 m).   Weight as of this encounter: 70.5 kg.  -Nutrition Problem: Severe Malnutrition -Etiology: chronic illness (Dementia) -Signs/Symptoms: severe fat depletion,severe muscle depletion,percent weight loss (15.4% weight loss in 9 months) -Percent weight loss: 15.4 % (less than 9 months) -Interventions: Tube feeding,Prostat,MVI; TF now discontinued and Panda Tube  Removed -Nutritionist consulted for further evaluation and recommendations -C/w 48 hour Calorie Count but she has not been keeping track of his meals appropriately so we will need to extend this out given the very limited no documentation abdomen completed by nursing staff.  Nutrition was stressing the importance of keeping accurate documentation and it is unclear if the patient is consuming his supplements well.  Will need to make an informed decision of whether he is able to adequately meet his nutritional needs orally we will need to have continued goals of care discussion and possible transfer to hospice if he does not -Nutritionist recommending c/w Glucerna Shake po TID, Magic Cup TIDwm, and MVI Daily; stressed importance of obtaining a calorie count -Patient is not eating very much and documentation of the calorie count has now stopped. -We will continue goals of care discussion and patient's son was updated today by myself and palliative care.  She met with palliative care and after further goals of care we will transition the patient to full comfort measures and stop all interventions not in the patient's comfort.  A referral will be made to residential hospice  DVT prophylaxis: Heparin 5,000 units sq q8h Code Status: DO NOT RESUSCITATE  Family Communication: No family present at bedside and I spoke to the son Jeneen Rinks via telephone he came and met with palliative Disposition Plan: We will be appropriate for residential hospice and referral has been made.  If hospital bed is unavailable anticipate possible in-hospital death if he does not go next few days  Status is: Inpatient  Remains inpatient appropriate because:Unsafe d/c plan, IV treatments appropriate due to intensity of illness or inability to take PO and Inpatient level of care appropriate due to severity of illness   Dispo:  Patient From:  Home  Planned Disposition: Residential Hospice   Expected discharge date:  11/17/2020  Medically stable for discharge:  No  Consultants:   Palliative Care  Infectious Diseases  Interventional Radiology  PCCM Transfer  Orthopedic Surgery  Nephrology   Procedures:  Left hip abscess s/p abscess drain to the left thigh on 11/05/2020  Antimicrobials: Anti-infectives (From admission, onward)   Start     Dose/Rate Route Frequency Ordered Stop   11/14/20 0300  ceFAZolin (ANCEF) IVPB 1 g/50 mL premix  Status:  Discontinued        1 g 100 mL/hr over 30 Minutes Intravenous Every 12 hours 11/13/20 1952 11/19/20 1634   11/08/20 2200  rifampin (RIFADIN) capsule 300 mg  Status:  Discontinued        300 mg Oral Every 12 hours 11/08/20 1319 11/08/20 1322   11/08/20 2200  rifampin (RIFADIN) 25 mg/mL oral suspension 300 mg  Status:  Discontinued        300 mg Oral Every 12 hours 11/08/20 1322 11/19/20 1634   11/06/20 2200  rifampin (RIFADIN) 25 mg/mL oral suspension 300 mg  Status:  Discontinued        300 mg Per Tube Every 12 hours 11/06/20 1008 11/08/20 1319   10/26/20 1100  rifampin (RIFADIN) 300 mg in sodium chloride 0.9 % 100 mL IVPB  Status:  Discontinued        300 mg 200 mL/hr over 30 Minutes Intravenous Every 12 hours 10/26/20 1007 11/06/20 1008   10/24/20 0815  ceFAZolin (ANCEF) IVPB 2g/100 mL premix  Status:  Discontinued        2 g 200 mL/hr over 30 Minutes Intravenous Every 8 hours 10/24/20 0804 11/13/20 1952   10/23/20 2200  ceFAZolin (ANCEF) IVPB 2g/100 mL premix  Status:  Discontinued        2 g 200 mL/hr over 30 Minutes Intravenous Every 12 hours 10/23/20 1406 10/24/20 0804   10/22/20 1000  ceFAZolin (ANCEF) IVPB 1 g/50 mL premix  Status:  Discontinued        1 g 100 mL/hr over 30 Minutes Intravenous Every 12 hours 10/18/2020 1745 10/23/20 1406   10/20/2020 2200  ceFAZolin (ANCEF) IVPB 2g/100 mL premix        2 g 200 mL/hr over 30 Minutes Intravenous  Once 10/29/2020 1739 10/19/2020 2239   10/27/2020 2100  cefTRIAXone (ROCEPHIN) 2 g in sodium chloride 0.9 %  100 mL IVPB  Status:  Discontinued        2 g 200 mL/hr over 30 Minutes Intravenous Every 24 hours 11/07/2020 1117 10/29/2020 1129   11/12/2020 1730  linezolid (ZYVOX) IVPB 600 mg  Status:  Discontinued        600 mg 300 mL/hr over 60 Minutes Intravenous Every 12 hours 11/01/2020 1154 10/18/2020 1739   10/20/2020 1130  cefTRIAXone (ROCEPHIN) 2 g in sodium chloride 0.9 % 100 mL IVPB  Status:  Discontinued        2 g 200 mL/hr over 30 Minutes Intravenous Every 24 hours 11/07/2020 1129 10/25/2020 1739   10/20/2020 1000  meropenem (MERREM) 500 mg in sodium chloride 0.9 % 100 mL IVPB  Status:  Discontinued        500 mg 200 mL/hr over 30 Minutes Intravenous Every 12 hours 11/11/2020 0330 11/01/2020 1117   10/24/2020 0400  linezolid (ZYVOX) IVPB 600 mg        600 mg 300 mL/hr over 60 Minutes Intravenous  Once 11/16/2020 0309 11/05/2020 0654   10/22/2020 0215  ceFEPIme (MAXIPIME) 2 g in sodium chloride 0.9 % 100 mL IVPB        2 g 200 mL/hr over 30 Minutes Intravenous  Once 11/02/2020 0212 10/29/2020 0306   11/09/2020 0215  ampicillin-sulbactam (UNASYN) 1.5 g in sodium chloride 0.9 % 100 mL IVPB  Status:  Discontinued        1.5 g 200 mL/hr over 30 Minutes Intravenous  Once 11/02/2020 0212 10/22/2020 0214   11/03/2020 0215  vancomycin (VANCOCIN) IVPB 1000 mg/200 mL  premix  Status:  Discontinued        1,000 mg 200 mL/hr over 60 Minutes Intravenous  Once 11/11/2020 2703 10/23/2020 0314        Subjective: Seen and examined at bedside and he is not awake or alert.  Resting comfortably and does not be in any acute distress.  Referral has been made for [facility].  Currently waiting residential hospice placement.  All medications not in patient's comfort have been discontinued.  Has a high risk for further worsening and decompensation and his prognosis extremely poor.  Objective: Vitals:   11/19/20 0722 11/19/20 1245 11/19/20 1518 11/20/20 0758  BP: (!) 153/78 118/61 (!) 123/58 125/61  Pulse: 93 83 77 (!) 102  Resp: '16 15 16 16  ' Temp:  97.6 F (36.4 C)   97.7 F (36.5 C)  TempSrc:    Axillary  SpO2: 100% 100% 100% 100%  Weight:      Height:        Intake/Output Summary (Last 24 hours) at 11/20/2020 1105 Last data filed at 11/20/2020 1100 Gross per 24 hour  Intake 575 ml  Output 350 ml  Net 225 ml   Filed Weights   11/02/20 0825 11/05/20 0500 11/09/20 0451  Weight: 71.5 kg 72.8 kg 70.5 kg   Examination: Physical Exam:  Constitutional: Patient is a very thin chronically ill-appearing African-American male who is somnolent and resting comfortably.  Does not arouse to physical verbal stimuli. Eyes: Lids normal,  ENMT: External Ears, Nose appear normal.  Neck: Appears normal, supple, no cervical masses, normal ROM, no appreciable thyromegaly; no JVD Respiratory: Diminished to auscultation bilaterally, no wheezing, rales, rhonchi or crackles. Normal respiratory effort and patient is not tachypenic. No accessory muscle use.  Unlabored breathing Cardiovascular: RRR, no murmurs / rubs / gallops. S1 and S2 auscultated. Abdomen: Soft, non-tender, non-distended. Bowel sounds positive.  GU: Deferred. Musculoskeletal: No clubbing / cyanosis of digits/nails.  Has a drain in the left hip Skin: No rashes, lesions, and I did not turn him to view his sacral ulcer. No induration; Warm and dry.  Neurologic: He is not awake enough to follow commands and does not respond to painful or verbal stimuli. Psychiatric: Impaired judgment and insight.  He did not awake or alert and oriented x 3  Data Reviewed: I have personally reviewed following labs and imaging studies  CBC: Recent Labs  Lab 11/14/20 0832 11/15/20 0238 11/17/20 0737 11/18/20 0258 11/19/20 0158  WBC 7.9 7.0 6.8 7.0 6.0  NEUTROABS 5.5 4.9 4.9 5.0 4.2  HGB 8.1* 9.1* 8.5* 9.3* 9.2*  HCT 23.2* 27.7* 24.6* 27.4* 27.2*  MCV 78.9* 81.2 79.1* 78.5* 77.5*  PLT 343 294 224 236 500   Basic Metabolic Panel: Recent Labs  Lab 11/15/20 0238 11/16/20 0213 11/17/20 0232  11/18/20 0258 11/19/20 0158  NA 147* 145 138 141 143  K 4.2 3.2* 3.8 3.5 3.8  CL 118* 116* 110 114* 115*  CO2 19* 17* 16* 18* 18*  GLUCOSE 129* 79 160* 127* 159*  BUN 36* 34* 33* 31* 29*  CREATININE 5.67* 5.54* 5.35* 5.11* 4.88*  CALCIUM 7.3* 7.6* 7.6* 7.6* 7.8*  MG 2.0 1.9 1.6* 1.8 2.0  PHOS 4.2 3.8 4.0 3.7 3.8   GFR: Estimated Creatinine Clearance: 12.4 mL/min (A) (by C-G formula based on SCr of 4.88 mg/dL (H)). Liver Function Tests: Recent Labs  Lab 11/15/20 0238 11/16/20 0213 11/17/20 0232 11/18/20 0258 11/19/20 0158  AST '18 16 26 24 ' 34  ALT <5 <5 <  5 5 <5  ALKPHOS 194* 137* 166* 166* 162*  BILITOT 0.4 0.2* 0.5 0.3 0.7  PROT 7.2 6.9 6.6 6.7 6.9  ALBUMIN 1.1* 2.7* 1.9* 1.8* 2.2*   No results for input(s): LIPASE, AMYLASE in the last 168 hours. No results for input(s): AMMONIA in the last 168 hours. Coagulation Profile: No results for input(s): INR, PROTIME in the last 168 hours. Cardiac Enzymes: No results for input(s): CKTOTAL, CKMB, CKMBINDEX, TROPONINI in the last 168 hours. BNP (last 3 results) No results for input(s): PROBNP in the last 8760 hours. HbA1C: No results for input(s): HGBA1C in the last 72 hours. CBG: Recent Labs  Lab 11/18/20 1139 11/18/20 1655 11/18/20 2242 11/19/20 0815 11/19/20 1203  GLUCAP 110* 130* 148* 152* 142*   Lipid Profile: No results for input(s): CHOL, HDL, LDLCALC, TRIG, CHOLHDL, LDLDIRECT in the last 72 hours. Thyroid Function Tests: No results for input(s): TSH, T4TOTAL, FREET4, T3FREE, THYROIDAB in the last 72 hours. Anemia Panel: No results for input(s): VITAMINB12, FOLATE, FERRITIN, TIBC, IRON, RETICCTPCT in the last 72 hours. Sepsis Labs: No results for input(s): PROCALCITON, LATICACIDVEN in the last 168 hours.  Recent Results (from the past 240 hour(s))  Culture, Urine     Status: Abnormal   Collection Time: 11/14/20 10:37 AM   Specimen: Urine, Random  Result Value Ref Range Status   Specimen Description URINE,  RANDOM  Final   Special Requests   Final    NONE Performed at [facility], [address] [place], [place] [address]    Culture >=100,000 COLONIES/mL YEAST (A)  Final   Report Status 11/15/2020 FINAL  Final     RN Pressure Injury Documentation: Pressure Injury 08/07/20 Buttocks Right;Medial;Left Stage 2 -  Partial thickness loss of dermis presenting as a shallow open injury with a red, pink wound bed without slough. (Active)  08/07/20 1914  Location: Buttocks  Location Orientation: Right;Medial;Left  Staging: Stage 2 -  Partial thickness loss of dermis presenting as a shallow open injury with a red, pink wound bed without slough.  Wound Description (Comments):   Present on Admission: Yes     Pressure Injury 11/02/20 Penis (Active)  11/02/20 0344  Location: Penis  Location Orientation:   Staging:   Wound Description (Comments):   Present on Admission:      Estimated body mass index is 21.08 kg/m as calculated from the following:   Height as of this encounter: 6' (1.829 m).   Weight as of this encounter: 70.5 kg.  Malnutrition Type:  Nutrition Problem: Severe Malnutrition Etiology: chronic illness (dementia)   Malnutrition Characteristics:  Signs/Symptoms: severe fat depletion,severe muscle depletion,percent weight loss (15.4% weight loss in 9 months) Percent weight loss: 15.4 % (less than 9 months)   Nutrition Interventions:  Interventions: Glucerna shake,MVI,Magic cup,Calorie Count    Radiology Studies: No results found.  Scheduled Meds: . Chlorhexidine Gluconate Cloth  6 each Topical Daily  . metoprolol tartrate  5 mg Intravenous Once   Continuous Infusions:   LOS: 30 days   Kerney Elbe, DO [facility] PAGER is on AMION  If 7PM-7AM, please contact night-coverage [email or web address]

## 2020-11-20 NOTE — Progress Notes (Signed)
Daily Progress Note   Patient Name: Marcus Downs.       Date: 11/20/2020 DOB: 04/28/1941  Age: 80 y.o. MRN#: [id number] Attending Physician: Merlene Laughter, DO Primary Care Physician: [name], MD Admit Date: Nov 05, 2020  Reason for Consultation/Follow-up: Establishing goals of care  Subjective: Patient in bed, appears comfortable. Breathing is even and nonlabored. He is not responsive to my voice or touch.  No family at bedside.  Review of Systems  Unable to perform ROS: Critical illness    Length of Stay: 30  Current Medications: Scheduled Meds:  . Chlorhexidine Gluconate Cloth  6 each Topical Daily  . metoprolol tartrate  5 mg Intravenous Once    Continuous Infusions:   PRN Meds: acetaminophen **OR** acetaminophen, antiseptic oral rinse, glycopyrrolate **OR** glycopyrrolate **OR** glycopyrrolate, haloperidol **OR** haloperidol **OR** haloperidol lactate, HYDROmorphone (DILAUDID) injection, LORazepam, ondansetron **OR** ondansetron (ZOFRAN) IV, oxyCODONE, polyvinyl alcohol  Physical Exam Vitals and nursing note reviewed.  Cardiovascular:     Pulses: Normal pulses.  Pulmonary:     Effort: Pulmonary effort is normal.  Neurological:     Comments: unresponsive             Vital Signs: BP 125/61 (BP Location: Right Arm)   Pulse (!) 102   Temp 97.7 F (36.5 C) (Oral)   Resp 16   Ht 6' (1.829 m)   Wt 70.5 kg   SpO2 100%   BMI 21.08 kg/m  SpO2: SpO2: 100 % O2 Device: O2 Device: Room Air O2 Flow Rate:    Intake/output summary:   Intake/Output Summary (Last 24 hours) at 11/20/2020 1432 Last data filed at 11/20/2020 1100 Gross per 24 hour  Intake 575 ml  Output 350 ml  Net 225 ml   LBM: Last BM Date: 11/20/20 Baseline Weight: Weight: 70 kg Most  recent weight: Weight: 70.5 kg       Palliative Assessment/Data: PPS: 10%    Flowsheet Rows   Flowsheet Row Most Recent Value  Intake Tab   Referral Department Hospitalist  Unit at Time of Referral Intermediate Care Unit  Date Notified 10/30/20  Palliative Care Type New Palliative care  Reason for referral Clarify Goals of Care, Counsel Regarding Hospice  Date of Admission 2020-11-05  Date first seen by Palliative Care 10/30/20  # of days Palliative referral  response time 0 Day(s)  # of days IP prior to Palliative referral 9  Clinical Assessment   Psychosocial & Spiritual Assessment   Palliative Care Outcomes       Patient Active Problem List   Diagnosis Date Noted  . Terminal care 11/19/2020  . Hyperkalemia   . Abscess   . Acute kidney injury (nontraumatic) (HCC)   . Dehydration   . Elevated liver function tests   . Prosthetic hip infection (HCC)   . MSSA bacteremia 10/23/2020  . Abscess of left hip   . Hypernatremia   . Severe sepsis (HCC) Oct 26, 2020  . Dysphagia as late effect of stroke 08/22/2020  . Urinary retention 08/22/2020  . Tachycardia   . Status post hip hemiarthroplasty 08/07/2020  . S/P hip hemiarthroplasty 08/07/2020  . Pressure injury of skin 08/07/2020  . Closed left hip fracture (HCC) 08/06/2020  . Elevated alkaline phosphatase level 11/01/2019  . Type 2 diabetes mellitus with vascular disease (HCC) 08/16/2019  . Diabetes mellitus with cataract (HCC) 08/05/2019  . Weight loss 04/30/2019  . Iron deficiency anemia 04/29/2018  . Pedal edema 07/11/2017  . Primary open angle glaucoma   . Encounter for nasogastric (NG) tube placement 02/20/2015  . Benign prostatic hyperplasia 02/20/2015  . Falls 11/08/2013  . Protein-calorie malnutrition, severe (HCC) 11/08/2013  . Medicare annual wellness visit, subsequent 05/25/2012  . 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 Pain due to onychomycosis of toenails of both feet  09/10/2010  . Renal insufficiency 04/13/2009  . GERD 03/30/2009  . Controlled diabetes mellitus type 2 with complications (HCC) 05/04/2007  . Hyperlipidemia associated with type 2 diabetes mellitus (HCC) 05/04/2007  . Essential hypertension 05/04/2007  . SYMPTOM, INCONTINENCE, MIXED, URGE/STRESS 05/04/2007  . Ex-smoker 05/04/2007    Palliative Care Assessment & Plan   Patient Profile: 80 y.o.malewith past medical history of stroke, vascular dementia, hypertension, hyperlipidemia, diabetes, GERD, dysphagia, chronic urinary retention with indwelling catheter due to BPH, L hemiarthroplasty 09/2021[facility].CT A/P found left hip abscess with MSSA bacteremia. Overall prognosis poor. Palliative care requested to continue goals of care conversations with family. He has failed to thrive, no longer eating or drinking.  Assessment/Recommendations/Plan   Continue current comfort care measures  Awaiting [facility] evaluation  Goals of Care and Additional Recommendations:  Limitations on Scope of Treatment: Full Comfort Care  Code Status:  DNR  Prognosis:   < 2 weeks  Discharge Planning:  Hospice facility  Care plan was discussed with patient's care team.   Thank you for allowing the Palliative Medicine Team to assist in the care of this patient.   Total time: 28 mins  Greater than 50%  of this time was spent counseling and coordinating care related to the above assessment and plan.  Ocie Bob, AGNP-C Palliative Medicine   Please contact Palliative Medicine Team phone at [phone] for questions and concerns.

## 2020-11-21 DIAGNOSIS — L0291 Cutaneous abscess, unspecified: Secondary | ICD-10-CM | POA: Diagnosis not present

## 2020-11-21 DIAGNOSIS — A419 Sepsis, unspecified organism: Secondary | ICD-10-CM | POA: Diagnosis not present

## 2020-11-21 DIAGNOSIS — R7881 Bacteremia: Secondary | ICD-10-CM | POA: Diagnosis not present

## 2020-11-21 DIAGNOSIS — L02416 Cutaneous abscess of left lower limb: Secondary | ICD-10-CM | POA: Diagnosis not present

## 2020-11-21 NOTE — Progress Notes (Signed)
PROGRESS NOTE    Marcus Downs.  ZJI:[id number] DOB: 04-23-1941 DOA: 10/26/2020 PCP: Ria Bush, MD   Brief Narrative:  Patient is a 80 year old African-American male with a past medical history significant for but not limited to vascular dementia, previous CVA with residual left-sided hemiplegia, essential hypertension, type 2 diabetes mellitus, dysphagia and hyperlipidemia as well as chronic urine retention with indwelling Foley catheter secondary to BPH and recent left femoral neck fracture status post left hemiarthroplasty on 08/08/2020 who presented from his nursing facility with altered mental status, agitation and acute kidney injury with electrolyte abnormalities with hyponatremia.  He was in septic shock and found to have an abscess around his arthroplasty hardware and an MSSA bacteremia and was deemed not a surgical candidate.  He was empirically treated with IV antibiotics for his MSSA infection and ended up having a left hip drain. He was transferred back to [facility] 10/24/20. Overall his prognosis and clinical status remained poor and my colleagues have had multiple conversations with family about palliative care or hospice and family not ready yet.  On 11/08/2020 is pain to tube was removed and he was eating about 50% of his meals but this is not been adequately documented.  We will ask for a calorie count to see if the patient actually nutrition orally.  If he does not he may be hospice appropriate.  Palliative care following appreciate continue goals of care discussion.   Interim History Intermittently he has not been awake enough to participate in Examination and so ? How much he is really taking in orally. IR evaluated and patient's left hip abscess drain is intact and there can continue to follow and recommending a repeat CT scan when his output is minimal and less than 10 cc daily.   On 11/09/2020 patient had better intake with his Magic cup and will start obstruction.   Calorie count was extended for another day.  Lantus was added for the patient on 11/10/2020.  On day 3 of his calorie count patient continued to do well with Magic cup with excellent consumption even though meal consumption was poor.  Patient blood sugars were in 300 so they continue with same as opposed to more nutritionally dense Ensure supplements.  Nutritionist recommended trialing Nucynta a fourth time to help patient meet his calorie count and protein needs.  Calorie count was continued throughout the weekend and per facility he needed to eat at least 90% of his meals prior to discharge but unfortunately no meal documentation was obtained over the weekend and nutritionist was unable to provide any additional information regarding patient's p.o consumption.Marland Kitchen  He did not want another PEG tube Dobbhoff.  In the interim patient intermittent acute kidney injury and worsening renal status.  He was trialed on mirtazapine for appetite stimulant.  Family wants to continue IV antibiotic course for now.  On 12/25 2020 when he had no significant p.o. intake and was not even alert enough to take his medication.  He was started on <5 MLS per hour since his poor p.o. intake.  After further goals of care discussion with palliative care medicine patient was changed to DO NOT RESUSCITATE.  Palliative continues to meet with the patient and the family about goals of care.  Nephrology was consulted given his AKI and they feel it is in the setting of severe dehydration with concomitant hemodynamic changes/hypotension.  Dr. Carolin Sicks is unable to rule out and related antibiotic use.  We have repeated a urinalysis, order bladder  scan and renal ultrasound which has ruled out obstruction.  Patient was given a 500 mL bolus and started on normal saline at 125 MLS per hour.  He is not a candidate for dialysis treatment per nephrology recommendations  11/15/2020: Patient has some facial twitching likely in the setting of his uremia.   Continues to have severe dehydration and has been predisposed to his AKI because of his prior AKI.  They are recommending continuing IV fluid hydration given that they think he still dry and because he has no oral intake.  They also ordered him some albumin IV and recommending strict I's and O's and avoid hypotensive episodes.  His functional status is very poor and he is not a dialysis candidate.  They are recommending continuing half-normal saline at 125 mils per hour and patient is also getting D5W at 75 MLS per hour.  11/16/2020: Patient did not eat any of his meal today and has been off all his medications.  Any continued goals of care discussion and nephrology is continuing his fluids for now given his severe dehydration.  11/17/2020: Not taking in much p.o. intake and keeps refusing his medications.  Palliative care involvement call the patient's son and they did not answer.  Patient's prognosis is extremely poor and he is hospice appropriate and I feel that he will not continue to do well.  We will need to continue addressing goals of care discussion given that he is not eating anything or drink anything.  Renal function is minimally improved from yesterday and nephrology has changed some IV fluids around again and only to D5 half-normal saline at 75 mL's per hour.  11/18/20: Neurology is following creatinine is slowly improving but patient's appetite is still not very good and has not been taking anything orally per nursing.  Nursing was attempting to feed his medications and he was doing a little bit better today than he was but still not eating very much.  Nephrology is giving the patient a dose of albumin today given his low albumin status.  Currently still continuing palliative care goals of care discussion and will attempt to reach the family to update them about the patient's condition  11/19/2020 patient continues not to eat well continues to spit out medications.  Later this afternoon he became  more altered and was only arousable with sternal rub.  Earlier this morning he was arousable with verbal stimuli.  I called the patient's son Marcus Downs and told him change of condition.  Marcus Downs came by and met with palliative and a decision was made to transition the patient to full comfort measures and residential hospice referral was made to [facility].  Patient is high risk for further decompensation, worsening and even death given his prognosis being extremely poor.  I would expect an in-hospital death if he is not transition to residential hospice next few days.  Palliative care has stopped all his other medications are not in the patient's comfort and he is not been taking anything p.o.  IV fluids were adjusted again by nephrology today.  11/20/2020: Remains Comfortable and in NAD. Referral has been made for [facility] and awaiting placement.   11/21/2020: Remains comfortable and not very responsive.  Awaiting residential hospice bed placement.  Anticipating in hospital death if he does not get a bed at hospice soon  Assessment & Plan:   Principal Problem:   MSSA bacteremia Active Problems:   Protein-calorie malnutrition, severe ([place])   Encounter for nasogastric (NG) tube placement  Severe sepsis (HCC)   Abscess of left hip   Hypernatremia   Abscess   Acute kidney injury (nontraumatic) (HCC)   Dehydration   Elevated liver function tests   Prosthetic hip infection (HCC)   Hyperkalemia   Terminal care   MSSA bacteremia/sepsis secondary to MSSA left hip abscess with septic shock: Prosthetic hip infection. poA Catheter Associated UTI -Treated with vasopressors and weaned off. -Blood cultures 12/4, MSSA bacteremia -Hip abscess 12/4, MSSA -Urine culture 12/4, MSSA -Repeat blood cultures negative.  Echocardiogram without any vegetation.   -Not a good candidate for operative removal/spacer or multistage surgery. -IR guided abscess drain, continues to drain.  Overall with poor clinical  improvement. -Currently remains on cefazolin and rifampicin.  Total 6 weeks of antibiotics planned. -Antibiotic end date 12/03/2020. -IR evaluated and recommending repeating CT scanning when his output from his drain is minimal less than 10 cc/day -Oral intake has been extremely poor throughout the weekend and has declined his NG tube. -Plan was to transfer to nursing home in next 24 to 48 hours if he is able to eat at least 50% of his meals but calorie count is inaccurate and will need to ensure that patient is adequately meeting his needs; if he is not tolerating p.o. left need to reevaluate goals of care discussion and he may be hospice appropriate as NG tube will not be replaced and PEG tube is not desired. -Patient has been doing good with his listener shakes p.o. 4 times daily and Magic of 3 times daily with meals and multivitamins however calorie count has not been discontinued given lack of nursing documentation -He has been started back on IV fluids and nephrology been consulted for AKI -Palliative care is involved in goals of care discussion and after discussion family wants to continue IV antibiotics at SNF with palliative however if kidney function continues to worsen of care will be recommending hospice house. -Patient has a very high risk of further decompensation and decline his prognosis is extremely poor given that he is not eating or drinking  And since he is further declining I called and spoke with the son when he came and met with palliative.  After further goals of care discussion a decision was made to transition to full comfort measures and a referral has been made to residential hospice at [facility].  All medications not in the patient's comfort and will be discontinued and patient is a very high risk for further decompensation given his poor prognosis and his multiple comorbidities as well as lack of p.o. intake.  SVT and Sinus Tachycardia -Imp nowroving.  Treated with  Cardizem.   -Now on Cardizem 60 mg po q8h and Metoprolol Tartrate 50 mg po BID and IV Metoprolol 2.5 IV q6hprn for HR>120 Sustained -Currently HR acceptable ranging from from 77-93; 102 documented this AM -TSH 1.6.  Acute metabolic encephalopathy in the setting of history of stroke and underlying vascular dementia -He has not made very much improvement in order course of his hospitalization and was somnolent today -Repeat MRI with no evidence of acute process.  Does have history of vascular dementia.   -Followed by palliative.  -Hospice recommended, family declined and could not make decisions. 12/20, patient started eating some but was not awake enough to take anything orally today.  Dobbhoff tube discontinued. -Initially he has had some improvement in mental status, however overall remains in very debilitated state; number to interact with me today but I cannot understand what he is  saying he is very confused.  His albumin was 1.1 today -Calorie count has  Now been discontinued given an adequate documentation by nursing.  Patient is not eating or drinking anything the last few days.  He is more hospice appropriate but will need to continue to have goals of care discussion.  In the interim we will continue with IV fluid hydration with D5 half-normal saline per nephrology recommendations -Was not as awake today when I evaluated him and now after further goals of care discussion he will be transitioned to comfort measures only and all medications will be discontinued that are not in the patient's comfort  Hypocalcemia -Patient's calcium was 7.8 the day before yesterday -Albumin was 2.2 -Corrected Calcium is 9.2 the day before yesterday -We will continue to monitor and trend given the patient is now being transitioned to full comfort measures  Thrombocytosis -Likely reactive  -Patient's platelet count is improved and now 242 on last check -We will not continue to monitor and trend as patient  is transferred to comfort measures only  Hypernatremia, improved -In the setting of dehydration from poor oral intake -Patient's sodium was 143 on last check  -Continue with fluid hydration per nephrology's recommendation but may likely stop given that he is getting transferred to comfort care  Microcytic Anemia -Patient's Hgb/Hct is relatively stable and improved from 8.5/24.6 -> 9.3/27.4 and the day before yesterday is now 9.2/27.2 -Checked anemia panel showed iron level of 25, U IBC 108, TIBC 133, saturation of 90%, ferritin of 559, folate level of 7.9, vitamin B12 332 -We will not continue to Monitor for S/Sx of Bleeding given that the patient is now Comfort Measures  GERD/GI Prophylaxis -Pantoprazole 40 mg po Daily will be discontinued now given that he is to be transferred to comfort measures only  Hypomagnesemia -Mag Level was 2.0 on last check -We will not continue to Monitor as he is Comfort Measures  Acute Kidney Injury with Hypernatremia, worsened  Metabolic acidosis -Sodium level improved with increasing free water flush.   -Renal function and sodium have both normalized but significantly elevated yesterday -Patient's sodium this morning was 147 and patient's BUNs/creatinine has gone from 11/1.00 on the 23rd December and has significantly worsened and 36/5.67 when it peaked and is now slowly coming down and repeat the day before yesterday was 29/4.88 -will obtain a urinalysis, urine culture, bladder scan, urine electrolytes as well as renal ultrasound -Nephrology was consulted for further evaluation and have made further adjustments to the fluids and patient is is getting changed to LR given his hyperchloremia however fluids have now stopped given that he is being transitioned to comfort measures -patient now has a CO2 of 18, anion gap of 10, chloride level of 115 -Avoid nephrotoxic medications, contrast dyes, hypotension and renally dose medications -We will not check any  more labs given that he is going be transition to comfort measures and referral has been made for residential hospice at [facility]  Type 2 Diabetes, uncontrolled with Hyperglycemia -> Hypoglycemia -HbA1c 8.1.  -On 12/20, hypoglycemic episodes after stopping tube feeding and continuing on long-acting insulin. -Decrease dose of long-acting insulin, sliding scale insulin to sensitive scale. He is now on Sensitive Novolog SSI AC/HS -If he is not eating well, he will not need that much insulin and is now on 10 units of Lantus subcu daily -CBGs ranging from 99-152; we will not continue to monitor labs  Dysphagia -Remained with poor mentation again today.  Initially was eating with  help but his oral intake is not being properly documented.   -He does not want a PEG tube  -No reinsertion of Dobbhoff tube recommended.   -He is hospice appropriate as he has not been eating at all and residential hospice referral has been made and patient will be going fully comfort measures now  Pressure Injury Pressure Injury 08/07/20 Buttocks Right;Medial;Left Stage 2 -  Partial thickness loss of dermis presenting as a shallow open injury with a red, pink wound bed without slough. (Active)  08/07/20 1914  Location: Buttocks  Location Orientation: Right;Medial;Left  Staging: Stage 2 -  Partial thickness loss of dermis presenting as a shallow open injury with a red, pink wound bed without slough.  Wound Description (Comments):   Present on Admission: Yes     Pressure Injury 11/02/20 Penis (Active)  11/02/20 0344  Location: Penis (distal meatus)  Location Orientation:   Staging:   Wound Description (Comments):   Present on Admission:    Severe Malnutrition in the Context of Chronic Illness -Estimated body mass index is 21.08 kg/m as calculated from the following:   Height as of this encounter: 6' (1.829 m).   Weight as of this encounter: 70.5 kg.  -Nutrition Problem: Severe Malnutrition -Etiology:  chronic illness (Dementia) -Signs/Symptoms: severe fat depletion,severe muscle depletion,percent weight loss (15.4% weight loss in 9 months) -Percent weight loss: 15.4 % (less than 9 months) -Interventions: Tube feeding,Prostat,MVI; TF now discontinued and Panda Tube Removed -Nutritionist consulted for further evaluation and recommendations -C/w 48 hour Calorie Count but she has not been keeping track of his meals appropriately so we will need to extend this out given the very limited no documentation abdomen completed by nursing staff.  Nutrition was stressing the importance of keeping accurate documentation and it is unclear if the patient is consuming his supplements well.  Will need to make an informed decision of whether he is able to adequately meet his nutritional needs orally we will need to have continued goals of care discussion and possible transfer to hospice if he does not -Nutritionist recommending c/w Glucerna Shake po TID, Magic Cup TIDwm, and MVI Daily; stressed importance of obtaining a calorie count -Patient is not eating very much and documentation of the calorie count has now stopped. -We will continue goals of care discussion and patient's son was updated today by myself and palliative care.  She met with palliative care and after further goals of care we will transition the patient to full comfort measures and stop all interventions not in the patient's comfort.  A referral will be made to residential hospice  DVT prophylaxis: Heparin 5,000 units sq q8h Code Status: DO NOT RESUSCITATE  Family Communication: No family present at bedside and I spoke to the son Marcus Downs via telephone he came and met with palliative Disposition Plan: We will be appropriate for residential hospice and referral has been made.  If hospital bed is unavailable anticipate possible in-hospital death if he does not go next few days  Status is: Inpatient  Remains inpatient appropriate because:Unsafe d/c plan,  IV treatments appropriate due to intensity of illness or inability to take PO and Inpatient level of care appropriate due to severity of illness   Dispo:  Patient From:  Home  Planned Disposition: Residential Hospice   Expected discharge date: 11/17/2020  Medically stable for discharge:  No  Consultants:   Palliative Care  Infectious Diseases  Interventional Radiology  PCCM Transfer  Orthopedic Surgery  Nephrology   Procedures:  Left hip abscess s/p abscess drain to the left thigh on 11/16/2020  Antimicrobials: Anti-infectives (From admission, onward)   Start     Dose/Rate Route Frequency Ordered Stop   11/14/20 0300  ceFAZolin (ANCEF) IVPB 1 g/50 mL premix  Status:  Discontinued        1 g 100 mL/hr over 30 Minutes Intravenous Every 12 hours 11/13/20 1952 11/19/20 1634   11/08/20 2200  rifampin (RIFADIN) capsule 300 mg  Status:  Discontinued        300 mg Oral Every 12 hours 11/08/20 1319 11/08/20 1322   11/08/20 2200  rifampin (RIFADIN) 25 mg/mL oral suspension 300 mg  Status:  Discontinued        300 mg Oral Every 12 hours 11/08/20 1322 11/19/20 1634   11/06/20 2200  rifampin (RIFADIN) 25 mg/mL oral suspension 300 mg  Status:  Discontinued        300 mg Per Tube Every 12 hours 11/06/20 1008 11/08/20 1319   10/26/20 1100  rifampin (RIFADIN) 300 mg in sodium chloride 0.9 % 100 mL IVPB  Status:  Discontinued        300 mg 200 mL/hr over 30 Minutes Intravenous Every 12 hours 10/26/20 1007 11/06/20 1008   10/24/20 0815  ceFAZolin (ANCEF) IVPB 2g/100 mL premix  Status:  Discontinued        2 g 200 mL/hr over 30 Minutes Intravenous Every 8 hours 10/24/20 0804 11/13/20 1952   10/23/20 2200  ceFAZolin (ANCEF) IVPB 2g/100 mL premix  Status:  Discontinued        2 g 200 mL/hr over 30 Minutes Intravenous Every 12 hours 10/23/20 1406 10/24/20 0804   10/22/20 1000  ceFAZolin (ANCEF) IVPB 1 g/50 mL premix  Status:  Discontinued        1 g 100 mL/hr over 30 Minutes Intravenous Every  12 hours 11/02/2020 1745 10/23/20 1406   10/19/2020 2200  ceFAZolin (ANCEF) IVPB 2g/100 mL premix        2 g 200 mL/hr over 30 Minutes Intravenous  Once 10/26/2020 1739 10/20/2020 2239   10/18/2020 2100  cefTRIAXone (ROCEPHIN) 2 g in sodium chloride 0.9 % 100 mL IVPB  Status:  Discontinued        2 g 200 mL/hr over 30 Minutes Intravenous Every 24 hours 11/05/2020 1117 11/13/2020 1129   10/20/2020 1730  linezolid (ZYVOX) IVPB 600 mg  Status:  Discontinued        600 mg 300 mL/hr over 60 Minutes Intravenous Every 12 hours 10/23/2020 1154 10/29/2020 1739   10/26/2020 1130  cefTRIAXone (ROCEPHIN) 2 g in sodium chloride 0.9 % 100 mL IVPB  Status:  Discontinued        2 g 200 mL/hr over 30 Minutes Intravenous Every 24 hours 10/28/2020 1129 11/13/2020 1739   11/17/2020 1000  meropenem (MERREM) 500 mg in sodium chloride 0.9 % 100 mL IVPB  Status:  Discontinued        500 mg 200 mL/hr over 30 Minutes Intravenous Every 12 hours 10/20/2020 0330 11/14/2020 1117   10/26/2020 0400  linezolid (ZYVOX) IVPB 600 mg        600 mg 300 mL/hr over 60 Minutes Intravenous  Once 11/16/2020 0309 11/07/2020 0654   10/28/2020 0215  ceFEPIme (MAXIPIME) 2 g in sodium chloride 0.9 % 100 mL IVPB        2 g 200 mL/hr over 30 Minutes Intravenous  Once 11/05/2020 0212 10/31/2020 0306   10/20/2020 0215  ampicillin-sulbactam (UNASYN) 1.5 g in  sodium chloride 0.9 % 100 mL IVPB  Status:  Discontinued        1.5 g 200 mL/hr over 30 Minutes Intravenous  Once 10/27/2020 0212 11/17/2020 0214   10/18/2020 0215  vancomycin (VANCOCIN) IVPB 1000 mg/200 mL premix  Status:  Discontinued        1,000 mg 200 mL/hr over 60 Minutes Intravenous  Once 11/12/2020 0051 10/25/2020 0314        Subjective: Seen and examined at bedside and he is resting comfortably and in no acute distress.  Briefly opens eyes to physical and verbal stimuli but ultimately impaired had unlabored breathing and appeared calm and comfortable today.  No other concerns or complaints at this time and still waiting to see  if he has a bed at residential hospice.  Objective: Vitals:   11/19/20 1518 11/20/20 0758 11/20/20 0758 11/21/20 0734  BP: (!) 123/58 125/61 125/61 (!) 163/66  Pulse: 77 (!) 102 (!) 102 99  Resp: '16 16 16 16  ' Temp:  97.7 F (36.5 C) 97.7 F (36.5 C) 98.7 F (37.1 C)  TempSrc:  Axillary Oral Oral  SpO2: 100% 100% 100% 100%  Weight:      Height:        Intake/Output Summary (Last 24 hours) at 11/21/2020 0855 Last data filed at 11/21/2020 0746 Gross per 24 hour  Intake 0 ml  Output 600 ml  Net -600 ml   Filed Weights   11/02/20 0825 11/05/20 0500 11/09/20 0451  Weight: 71.5 kg 72.8 kg 70.5 kg   Examination: Physical Exam:  Constitutional: Patient is a thin chronically ill-appearing African-American male currently in no acute distress appears calm and comfortable Respiratory: Slightly diminished to auscultation bilaterally with coarse breath sounds and some rhonchi.  Unlabored breathing,  Cardiovascular: RRR, no murmurs / rubs / gallops. S1 and S2 auscultated. N no appreciable lower extremity edema Abdomen: Soft, non-tender, non-distended. Bowel sounds positive.  GU: Deferred. Neurologic: Does not awaken-does not follow commands but briefly open his eyes to physical verbal stimuli.  Data Reviewed: I have personally reviewed following labs and imaging studies  CBC: Recent Labs  Lab 11/15/20 0238 11/17/20 0737 11/18/20 0258 11/19/20 0158  WBC 7.0 6.8 7.0 6.0  NEUTROABS 4.9 4.9 5.0 4.2  HGB 9.1* 8.5* 9.3* 9.2*  HCT 27.7* 24.6* 27.4* 27.2*  MCV 81.2 79.1* 78.5* 77.5*  PLT 294 224 236 102   Basic Metabolic Panel: Recent Labs  Lab 11/15/20 0238 11/16/20 0213 11/17/20 0232 11/18/20 0258 11/19/20 0158  NA 147* 145 138 141 143  K 4.2 3.2* 3.8 3.5 3.8  CL 118* 116* 110 114* 115*  CO2 19* 17* 16* 18* 18*  GLUCOSE 129* 79 160* 127* 159*  BUN 36* 34* 33* 31* 29*  CREATININE 5.67* 5.54* 5.35* 5.11* 4.88*  CALCIUM 7.3* 7.6* 7.6* 7.6* 7.8*  MG 2.0 1.9 1.6* 1.8 2.0   PHOS 4.2 3.8 4.0 3.7 3.8   GFR: Estimated Creatinine Clearance: 12.4 mL/min (A) (by C-G formula based on SCr of 4.88 mg/dL (H)). Liver Function Tests: Recent Labs  Lab 11/15/20 0238 11/16/20 0213 11/17/20 0232 11/18/20 0258 11/19/20 0158  AST '18 16 26 24 ' 34  ALT <5 <5 <5 5 <5  ALKPHOS 194* 137* 166* 166* 162*  BILITOT 0.4 0.2* 0.5 0.3 0.7  PROT 7.2 6.9 6.6 6.7 6.9  ALBUMIN 1.1* 2.7* 1.9* 1.8* 2.2*   No results for input(s): LIPASE, AMYLASE in the last 168 hours. No results for input(s): AMMONIA in the last 168  hours. Coagulation Profile: No results for input(s): INR, PROTIME in the last 168 hours. Cardiac Enzymes: No results for input(s): CKTOTAL, CKMB, CKMBINDEX, TROPONINI in the last 168 hours. BNP (last 3 results) No results for input(s): PROBNP in the last 8760 hours. HbA1C: No results for input(s): HGBA1C in the last 72 hours. CBG: Recent Labs  Lab 11/18/20 1139 11/18/20 1655 11/18/20 2242 11/19/20 0815 11/19/20 1203  GLUCAP 110* 130* 148* 152* 142*   Lipid Profile: No results for input(s): CHOL, HDL, LDLCALC, TRIG, CHOLHDL, LDLDIRECT in the last 72 hours. Thyroid Function Tests: No results for input(s): TSH, T4TOTAL, FREET4, T3FREE, THYROIDAB in the last 72 hours. Anemia Panel: No results for input(s): VITAMINB12, FOLATE, FERRITIN, TIBC, IRON, RETICCTPCT in the last 72 hours. Sepsis Labs: No results for input(s): PROCALCITON, LATICACIDVEN in the last 168 hours.  Recent Results (from the past 240 hour(s))  Culture, Urine     Status: Abnormal   Collection Time: 11/14/20 10:37 AM   Specimen: Urine, Random  Result Value Ref Range Status   Specimen Description URINE, RANDOM  Final   Special Requests   Final    NONE Performed at [facility], [address], [place], [place] [address]    Culture >=100,000 COLONIES/mL YEAST (A)  Final   Report Status 11/15/2020 FINAL  Final     RN Pressure Injury Documentation: Pressure Injury 08/07/20 Buttocks  Right;Medial;Left Stage 2 -  Partial thickness loss of dermis presenting as a shallow open injury with a red, pink wound bed without slough. (Active)  08/07/20 1914  Location: Buttocks  Location Orientation: Right;Medial;Left  Staging: Stage 2 -  Partial thickness loss of dermis presenting as a shallow open injury with a red, pink wound bed without slough.  Wound Description (Comments):   Present on Admission: Yes     Pressure Injury 11/02/20 Penis (Active)  11/02/20 0344  Location: Penis  Location Orientation:   Staging:   Wound Description (Comments):   Present on Admission:      Estimated body mass index is 21.08 kg/m as calculated from the following:   Height as of this encounter: 6' (1.829 m).   Weight as of this encounter: 70.5 kg.  Malnutrition Type:  Nutrition Problem: Severe Malnutrition Etiology: chronic illness (dementia)   Malnutrition Characteristics:  Signs/Symptoms: severe fat depletion,severe muscle depletion,percent weight loss (15.4% weight loss in 9 months) Percent weight loss: 15.4 % (less than 9 months)   Nutrition Interventions:  Interventions: Glucerna shake,MVI,Magic cup,Calorie Count    Radiology Studies: No results found.  Scheduled Meds: . Chlorhexidine Gluconate Cloth  6 each Topical Daily  . metoprolol tartrate  5 mg Intravenous Once   Continuous Infusions:   LOS: 31 days   Kerney Elbe, DO [facility] PAGER is on AMION  If 7PM-7AM, please contact night-coverage [email or web address]

## 2020-11-21 NOTE — Progress Notes (Signed)
Daily Progress Note   Patient Name: Marcus Downs.       Date: 11/21/2020 DOB: 1941/06/02  Age: 80 y.o. MRN#: [id number] Attending Physician: Merlene Laughter, DO Primary Care Physician: [name], MD Admit Date: 11/04/2020  Reason for Consultation/Follow-up: Terminal Care  Subjective: Marcus Downs is lying in bed, appears comfortable. Responds to noxious stimuli, nonverbal.  Awaiting inpatient hospice bed.   Review of Systems  Unable to perform ROS: Acuity of condition    Length of Stay: 31  Current Medications: Scheduled Meds:  . Chlorhexidine Gluconate Cloth  6 each Topical Daily  . metoprolol tartrate  5 mg Intravenous Once    Continuous Infusions:   PRN Meds: acetaminophen **OR** acetaminophen, antiseptic oral rinse, glycopyrrolate **OR** glycopyrrolate **OR** glycopyrrolate, haloperidol **OR** haloperidol **OR** haloperidol lactate, HYDROmorphone (DILAUDID) injection, LORazepam, ondansetron **OR** ondansetron (ZOFRAN) IV, oxyCODONE, polyvinyl alcohol  Physical Exam Vitals and nursing note reviewed.  Constitutional:      General: He is not in acute distress.    Appearance: He is ill-appearing.  HENT:     Mouth/Throat:     Comments: Lips are dry and peeling Cardiovascular:     Pulses: Normal pulses.  Pulmonary:     Effort: Pulmonary effort is normal.  Skin:    General: Skin is warm.             Vital Signs: BP (!) 163/66 (BP Location: Right Arm)   Pulse 99   Temp 98.7 F (37.1 C) (Oral)   Resp 16   Ht 6' (1.829 m)   Wt 70.5 kg   SpO2 100%   BMI 21.08 kg/m  SpO2: SpO2: 100 % O2 Device: O2 Device: Room Air O2 Flow Rate:    Intake/output summary:   Intake/Output Summary (Last 24 hours) at 11/21/2020 1432 Last data filed at 11/21/2020 0746 Gross  per 24 hour  Intake --  Output 250 ml  Net -250 ml   LBM: Last BM Date: 11/20/20 Baseline Weight: Weight: 70 kg Most recent weight: Weight: 70.5 kg       Palliative Assessment/Data: PPS: 10%    Flowsheet Rows   Flowsheet Row Most Recent Value  Intake Tab   Referral Department Hospitalist  Unit at Time of Referral Intermediate Care Unit  Date Notified 10/30/20  Palliative Care Type New Palliative  care  Reason for referral Clarify Goals of Care, Counsel Regarding Hospice  Date of Admission 10/20/2020  Date first seen by Palliative Care 10/30/20  # of days Palliative referral response time 0 Day(s)  # of days IP prior to Palliative referral 9  Clinical Assessment   Psychosocial & Spiritual Assessment   Palliative Care Outcomes       Patient Active Problem List   Diagnosis Date Noted  . Terminal care 11/19/2020  . Hyperkalemia   . Abscess   . Acute kidney injury (nontraumatic) (HCC)   . Dehydration   . Elevated liver function tests   . Prosthetic hip infection (HCC)   . MSSA bacteremia 10/23/2020  . Abscess of left hip   . Hypernatremia   . Severe sepsis (HCC) 11/13/2020  . Dysphagia as late effect of stroke 08/22/2020  . Urinary retention 08/22/2020  . Tachycardia   . Status post hip hemiarthroplasty 08/07/2020  . S/P hip hemiarthroplasty 08/07/2020  . Pressure injury of skin 08/07/2020  . Closed left hip fracture (HCC) 08/06/2020  . Elevated alkaline phosphatase level 11/01/2019  . Type 2 diabetes mellitus with vascular disease (HCC) 08/16/2019  . Diabetes mellitus with cataract (HCC) 08/05/2019  . Weight loss 04/30/2019  . Iron deficiency anemia 04/29/2018  . Pedal edema 07/11/2017  . Primary open angle glaucoma   . Encounter for nasogastric (NG) tube placement 02/20/2015  . Benign prostatic hyperplasia 02/20/2015  . Falls 11/08/2013  . Protein-calorie malnutrition, severe (HCC) 11/08/2013  . Medicare annual wellness visit, subsequent 05/25/2012  .  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 Pain due to onychomycosis of toenails of both feet 09/10/2010  . Renal insufficiency 04/13/2009  . GERD 03/30/2009  . Controlled diabetes mellitus type 2 with complications (HCC) 05/04/2007  . Hyperlipidemia associated with type 2 diabetes mellitus (HCC) 05/04/2007  . Essential hypertension 05/04/2007  . SYMPTOM, INCONTINENCE, MIXED, URGE/STRESS 05/04/2007  . Ex-smoker 05/04/2007    Palliative Care Assessment & Plan   Patient Profile: 80 y.o.malewith past medical history of stroke, vascular dementia, hypertension, hyperlipidemia, diabetes, GERD, dysphagia, chronic urinary retention with indwelling catheter due to BPH, L hemiarthroplasty 09/2021admitted on [facility]21from [facility] and sepsis.CT A/P found left hip abscess with MSSA bacteremia. Overall prognosis poor. Palliative care requested to continue goals of care conversations with family.He has failed to thrive, no longer eating or drinking.  Assessment/Recommendations/Plan   Patient is comfortable- continues to be stable for transfer to inpatient hospice facility  Order entered to apply vaseline to patient's dry and peeling lips  Goals of Care and Additional Recommendations:  Limitations on Scope of Treatment: Full Comfort Care  Code Status:  DNR  Prognosis:   < 2 weeks  Discharge Planning:  Hospice facility   Thank you for allowing the Palliative Medicine Team to assist in the care of this patient.   Total time:  26 mins Greater than 50%  of this time was spent counseling and coordinating care related to the above assessment and plan.  Ocie Bob, AGNP-C Palliative Medicine   Please contact Palliative Medicine Team phone at [phone] for questions and concerns.

## 2020-11-21 NOTE — Plan of Care (Signed)

## 2020-11-22 ENCOUNTER — Ambulatory Visit: Payer: Medicare Other | Admitting: Infectious Disease

## 2020-11-22 DIAGNOSIS — A419 Sepsis, unspecified organism: Secondary | ICD-10-CM | POA: Diagnosis not present

## 2020-11-22 DIAGNOSIS — L0291 Cutaneous abscess, unspecified: Secondary | ICD-10-CM | POA: Diagnosis not present

## 2020-11-22 DIAGNOSIS — N179 Acute kidney failure, unspecified: Secondary | ICD-10-CM | POA: Diagnosis not present

## 2020-11-22 DIAGNOSIS — R7881 Bacteremia: Secondary | ICD-10-CM | POA: Diagnosis not present

## 2020-11-22 DIAGNOSIS — R627 Adult failure to thrive: Secondary | ICD-10-CM

## 2020-11-22 DIAGNOSIS — L02416 Cutaneous abscess of left lower limb: Secondary | ICD-10-CM | POA: Diagnosis not present

## 2020-11-22 NOTE — Plan of Care (Signed)
  Problem: Health Behavior/Discharge Planning: Goal: Ability to manage health-related needs will improve Outcome: Not Progressing   Problem: Activity: Goal: Risk for activity intolerance will decrease Outcome: Not Progressing   Problem: Coping: Goal: Level of anxiety will decrease Outcome: Not Progressing   Problem: Pain Managment: Goal: General experience of comfort will improve Outcome: Progressing   Problem: Safety: Goal: Ability to remain free from injury will improve Outcome: Progressing   Problem: Education: Goal: Knowledge of General Education information will improve Description: Including pain rating scale, medication(s)/side effects and non-pharmacologic comfort measures Outcome: Not Progressing   Problem: Health Behavior/Discharge Planning: Goal: Ability to manage health-related needs will improve 11/22/2020 2040 by Lenord Fellers, RN Outcome: Not Progressing 11/22/2020 2039 by Lenord Fellers, RN Outcome: Not Progressing   Problem: Activity: Goal: Risk for activity intolerance will decrease Outcome: Not Progressing   Problem: Coping: Goal: Level of anxiety will decrease Outcome: Not Progressing

## 2020-11-22 NOTE — Progress Notes (Signed)
Civil engineer, contracting [facility]) Hospital Liaison note.    Chart and pt information under reviewed by [facility] physician.  Hospice eligibility confirmed.  [facility] is unable to offer a room today. Hospital Liaison will follow up tomorrow or sooner if a room becomes available. Please do not hesitate to call with questions.    Thank you for the opportunity to participate in this patient's care.  Gillian Scarce, BSN, RN [facility] Liaison (listed on [place] under Hospice/[facility])    [phone] (24h on call)

## 2020-11-22 NOTE — Progress Notes (Signed)
Daily Progress Note   Patient Name: Marcus Downs.       Date: 11/22/2020 DOB: Mar 28, 1941  Age: 80 y.o. MRN#: [id number] Attending Physician: Briant Cedar, MD Primary Care Physician: [name], MD Admit Date: 17-Nov-2020  Reason for Consultation/Follow-up: Terminal Care  Subjective: Marcus Downs is awake on my visit today. Looking up to the ceiling. Does not respond to any of my questions. Offered food and drink, however, he declined.   Review of Systems  Unable to perform ROS: Acuity of condition    Length of Stay: 32  Current Medications: Scheduled Meds:  . Chlorhexidine Gluconate Cloth  6 each Topical Daily  . metoprolol tartrate  5 mg Intravenous Once    Continuous Infusions:   PRN Meds: acetaminophen **OR** acetaminophen, antiseptic oral rinse, glycopyrrolate **OR** glycopyrrolate **OR** glycopyrrolate, haloperidol **OR** haloperidol **OR** haloperidol lactate, HYDROmorphone (DILAUDID) injection, LORazepam, ondansetron **OR** ondansetron (ZOFRAN) IV, oxyCODONE, polyvinyl alcohol  Physical Exam Vitals and nursing note reviewed.  Constitutional:      General: He is not in acute distress. HENT:     Mouth/Throat:     Comments: Dry, lower lip peeling Cardiovascular:     Rate and Rhythm: Normal rate.     Pulses: Normal pulses.  Pulmonary:     Effort: Pulmonary effort is normal.  Neurological:     Mental Status: He is alert.     Comments: nonverbal             Vital Signs: BP (!) 178/62 (BP Location: Right Arm)   Pulse (!) 102   Temp 98.4 F (36.9 C) (Oral)   Resp 16   Ht 6' (1.829 m)   Wt 70.5 kg   SpO2 100%   BMI 21.08 kg/m  SpO2: SpO2: 100 % O2 Device: O2 Device: Room Air O2 Flow Rate:    Intake/output summary:   Intake/Output Summary (Last  24 hours) at 11/22/2020 1336 Last data filed at 11/21/2020 1500 Gross per 24 hour  Intake --  Output 500 ml  Net -500 ml   LBM: Last BM Date: 11/20/20 Baseline Weight: Weight: 70 kg Most recent weight: Weight: 70.5 kg       Palliative Assessment/Data: PPS: 10%    Flowsheet Rows   Flowsheet Row Most Recent Value  Intake Tab   Referral Department Hospitalist  Unit at Time of Referral Intermediate Care Unit  Date Notified 10/30/20  Palliative Care Type New Palliative care  Reason for referral Clarify Goals of Care, Counsel Regarding Hospice  Date of Admission 2020/10/29  Date first seen by Palliative Care 10/30/20  # of days Palliative referral response time 0 Day(s)  # of days IP prior to Palliative referral 9  Clinical Assessment   Psychosocial & Spiritual Assessment   Palliative Care Outcomes       Patient Active Problem List   Diagnosis Date Noted  . Terminal care 11/19/2020  . Hyperkalemia   . Abscess   . Acute kidney injury (nontraumatic) (HCC)   . Dehydration   . Elevated liver function tests   . Prosthetic hip infection (HCC)   . MSSA bacteremia 10/23/2020  . Abscess of left hip   . Hypernatremia   . Severe sepsis (HCC) Oct 29, 2020  . Dysphagia as late effect of stroke 08/22/2020  . Urinary retention 08/22/2020  . Tachycardia   . Status post hip hemiarthroplasty 08/07/2020  . S/P hip hemiarthroplasty 08/07/2020  . Pressure injury of skin 08/07/2020  . Closed left hip fracture (HCC) 08/06/2020  . Elevated alkaline phosphatase level 11/01/2019  . Type 2 diabetes mellitus with vascular disease (HCC) 08/16/2019  . Diabetes mellitus with cataract (HCC) 08/05/2019  . Weight loss 04/30/2019  . Iron deficiency anemia 04/29/2018  . Pedal edema 07/11/2017  . Primary open angle glaucoma   . Encounter for nasogastric (NG) tube placement 02/20/2015  . Benign prostatic hyperplasia 02/20/2015  . Falls 11/08/2013  . Protein-calorie malnutrition, severe (HCC) 11/08/2013   . Medicare annual wellness visit, subsequent 05/25/2012  . 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 Pain due to onychomycosis of toenails of both feet 09/10/2010  . Renal insufficiency 04/13/2009  . GERD 03/30/2009  . Controlled diabetes mellitus type 2 with complications (HCC) 05/04/2007  . Hyperlipidemia associated with type 2 diabetes mellitus (HCC) 05/04/2007  . Essential hypertension 05/04/2007  . SYMPTOM, INCONTINENCE, MIXED, URGE/STRESS 05/04/2007  . Ex-smoker 05/04/2007    Palliative Care Assessment & Plan   Patient Profile: 80 y.o.malewith past medical history of stroke, vascular dementia, hypertension, hyperlipidemia, diabetes, GERD, dysphagia, chronic urinary retention with indwelling catheter due to BPH, L hemiarthroplasty 09/2021admitted on 12-Dec-2021from SNF[facility] and sepsis.CT A/P found left hip abscess with MSSA bacteremia. Overall prognosis poor. Palliative care requested to continue goals of care conversations with family.He has failed to thrive, no longer eating or drinking.  Assessment/Recommendations/Plan   Continue comfort measures  Awaiting [facility] bed- stable for transfer  Goals of Care and Additional Recommendations:  Limitations on Scope of Treatment: Full Comfort Care  Code Status:  DNR  Prognosis:   < 2 weeks  Discharge Planning:  Hospice facility  Care plan was discussed with patient's nurse.  Thank you for allowing the Palliative Medicine Team to assist in the care of this patient.   Total time: 22 mins Greater than 50%  of this time was spent counseling and coordinating care related to the above assessment and plan.  Ocie Bob, AGNP-C Palliative Medicine   Please contact Palliative Medicine Team phone at [phone] for questions and concerns.

## 2020-11-22 NOTE — Progress Notes (Signed)
PROGRESS NOTE  Marcus Downs. BTD:[id number] DOB: July 24, 1941 DOA: 11/09/2020 PCP: Ria Bush, MD  HPI/Recap of past 24 hours: HPI from Dr Alfredia Ferguson Patient is a 80 year old African-American male with a past medical history significant for but not limited to vascular dementia, previous CVA with residual left-sided hemiplegia, essential hypertension, type 2 diabetes mellitus, dysphagia and hyperlipidemia as well as chronic urine retention with indwelling Foley catheter secondary to BPH and recent left femoral neck fracture status post left hemiarthroplasty on 08/08/2020 who presented from his nursing facility with altered mental status, agitation and acute kidney injury with electrolyte abnormalities with hyponatremia.  He was in septic shock and found to have an abscess around his arthroplasty hardware and an MSSA bacteremia and was deemed not a surgical candidate.  He was empirically treated with IV antibiotics for his MSSA infection and ended up having a left hip drain. He was transferred back to [facility] service 10/24/20. Overall his prognosis and clinical status continued to decline, in addition to poor oral intake, palliative care consulted, patient eventually made comfort care.    Today, patient noted to be awake, nonverbal, appears comfortable. Awaiting inpatient hospice placement    Assessment/Plan: Principal Problem:   MSSA bacteremia Active Problems:   Protein-calorie malnutrition, severe ([place])   Encounter for nasogastric (NG) tube placement   Severe sepsis ([place])   Abscess of left hip   Hypernatremia   Abscess   Acute kidney injury (nontraumatic) (HCC)   Dehydration   Elevated liver function tests   Prosthetic hip infection (HCC)   Hyperkalemia   Terminal care   MSSA bacteremia/septic shock/MSSA left hip abscess/prosthetic hip infection/catheter associated UTI S/p IR guided abscess drain Initially on pressors, weaned off Blood culture, urine culture, hip abscess, growing  MSSA Echocardiogram without any vegetation S/p cefazolin and rifampicin Due to very poor prognosis and worsening clinical status, in addition to poor oral intake patient was switched to comfort care  Acute metabolic encephalopathy in the setting of history of CVA/vascular dementia Continued to decline, switched to comfort care  AKI/hypernatremia/metabolic acidosis Likely due to bacteremia, sepsis in addition to poor oral intake Comfort care  Electrolyte imbalance Hypocalcemia, hypomagnesemia, hypernatremia  Diabetes mellitus type 2, uncontrolled with hyperglycemia Episode of hypoglycemia Last A1c 8.1 Comfort care  Dysphagia/severe malnutrition Dietitian consulted Switched to comfort care        Malnutrition Type:  Nutrition Problem: Severe Malnutrition Etiology: chronic illness (dementia)   Malnutrition Characteristics:  Signs/Symptoms: severe fat depletion,severe muscle depletion,percent weight loss (15.4% weight loss in 9 months) Percent weight loss: 15.4 % (less than 9 months)   Nutrition Interventions:  Interventions: Glucerna shake,MVI,Magic cup,Calorie Count    Estimated body mass index is 21.08 kg/m as calculated from the following:   Height as of this encounter: 6' (1.829 m).   Weight as of this encounter: 70.5 kg.     Code Status: DNR  Family Communication: None at bedside  Disposition Plan: Status is: Inpatient  Remains inpatient appropriate because:Altered mental status, inpatient hospice placement   Dispo:  Patient From:  SNF  Planned Disposition: Residential Hospice  Expected discharge date: 11/24/2020  Medically stable for discharge:      Consultants:  Palliative Care  Infectious Diseases  Interventional Radiology  PCCM   Orthopedic Surgery  Nephrology   Procedures:   Left hip abscesss/pabscess drain to theleft thigh on 11/12/2020  Antimicrobials:  Stopped cefazolin + rimfapicin  DVT  prophylaxis: None   Objective: Vitals:   11/20/20 0758 11/20/20 0758 11/21/20  6659 11/21/20 2303  BP: 125/61 125/61 (!) 163/66 (!) 178/62  Pulse: (!) 102 (!) 102 99 (!) 102  Resp: 16 16 16 16   Temp: 97.7 F (36.5 C) 97.7 F (36.5 C) 98.7 F (37.1 C) 98.4 F (36.9 C)  TempSrc: Axillary Oral Oral Oral  SpO2: 100% 100% 100% 100%  Weight:      Height:        Intake/Output Summary (Last 24 hours) at 11/22/2020 1242 Last data filed at 11/21/2020 1500 Gross per 24 hour  Intake -  Output 500 ml  Net -500 ml   Filed Weights   11/02/20 0825 11/05/20 0500 11/09/20 0451  Weight: 71.5 kg 72.8 kg 70.5 kg    Exam:  General: NAD   Cardiovascular: S1, S2 present  Respiratory: Coarse BS B/l  Abdomen: Soft, nontender, nondistended, bowel sounds present  Musculoskeletal: No bilateral pedal edema noted  Skin: Noted pressure ulcers  Psychiatry: Unable to assess   Data Reviewed: CBC: Recent Labs  Lab 11/17/20 0737 11/18/20 0258 11/19/20 0158  WBC 6.8 7.0 6.0  NEUTROABS 4.9 5.0 4.2  HGB 8.5* 9.3* 9.2*  HCT 24.6* 27.4* 27.2*  MCV 79.1* 78.5* 77.5*  PLT 224 236 242   Basic Metabolic Panel: Recent Labs  Lab 11/16/20 0213 11/17/20 0232 11/18/20 0258 11/19/20 0158  NA 145 138 141 143  K 3.2* 3.8 3.5 3.8  CL 116* 110 114* 115*  CO2 17* 16* 18* 18*  GLUCOSE 79 160* 127* 159*  BUN 34* 33* 31* 29*  CREATININE 5.54* 5.35* 5.11* 4.88*  CALCIUM 7.6* 7.6* 7.6* 7.8*  MG 1.9 1.6* 1.8 2.0  PHOS 3.8 4.0 3.7 3.8   GFR: Estimated Creatinine Clearance: 12.4 mL/min (A) (by C-G formula based on SCr of 4.88 mg/dL (H)). Liver Function Tests: Recent Labs  Lab 11/16/20 0213 11/17/20 0232 11/18/20 0258 11/19/20 0158  AST 16 26 24  34  ALT <5 <5 5 <5  ALKPHOS 137* 166* 166* 162*  BILITOT 0.2* 0.5 0.3 0.7  PROT 6.9 6.6 6.7 6.9  ALBUMIN 2.7* 1.9* 1.8* 2.2*   No results for input(s): LIPASE, AMYLASE in the last 168 hours. No results for input(s): AMMONIA in the last 168  hours. Coagulation Profile: No results for input(s): INR, PROTIME in the last 168 hours. Cardiac Enzymes: No results for input(s): CKTOTAL, CKMB, CKMBINDEX, TROPONINI in the last 168 hours. BNP (last 3 results) No results for input(s): PROBNP in the last 8760 hours. HbA1C: No results for input(s): HGBA1C in the last 72 hours. CBG: Recent Labs  Lab 11/18/20 1139 11/18/20 1655 11/18/20 2242 11/19/20 0815 11/19/20 1203  GLUCAP 110* 130* 148* 152* 142*   Lipid Profile: No results for input(s): CHOL, HDL, LDLCALC, TRIG, CHOLHDL, LDLDIRECT in the last 72 hours. Thyroid Function Tests: No results for input(s): TSH, T4TOTAL, FREET4, T3FREE, THYROIDAB in the last 72 hours. Anemia Panel: No results for input(s): VITAMINB12, FOLATE, FERRITIN, TIBC, IRON, RETICCTPCT in the last 72 hours. Urine analysis:    Component Value Date/Time   COLORURINE AMBER (A) 11/14/2020 1136   APPEARANCEUR CLOUDY (A) 11/14/2020 1136   LABSPEC 1.009 11/14/2020 1136   PHURINE 6.0 11/14/2020 1136   GLUCOSEU NEGATIVE 11/14/2020 1136   HGBUR MODERATE (A) 11/14/2020 1136   BILIRUBINUR NEGATIVE 11/14/2020 1136   KETONESUR NEGATIVE 11/14/2020 1136   PROTEINUR 30 (A) 11/14/2020 1136   UROBILINOGEN 1.0 10/30/2013 2358   NITRITE NEGATIVE 11/14/2020 1136   LEUKOCYTESUR LARGE (A) 11/14/2020 1136   Sepsis Labs: @LABRCNTIP (procalcitonin:4,lacticidven:4)  ) Recent  Results (from the past 240 hour(s))  Culture, Urine     Status: Abnormal   Collection Time: 11/14/20 10:37 AM   Specimen: Urine, Random  Result Value Ref Range Status   Specimen Description URINE, RANDOM  Final   Special Requests   Final    NONE Performed at [facility], [address] [place], [place] [address]    Culture >=100,000 COLONIES/mL YEAST (A)  Final   Report Status 11/15/2020 FINAL  Final      Studies: No results found.  Scheduled Meds: . Chlorhexidine Gluconate Cloth  6 each Topical Daily  . metoprolol tartrate  5 mg  Intravenous Once    Continuous Infusions:   LOS: 32 days     Briant Cedar, MD [facility]  If 7PM-7AM, please contact night-coverage [email or web address] 11/22/2020, 12:42 PM

## 2020-11-22 NOTE — Plan of Care (Signed)

## 2020-11-23 DIAGNOSIS — Z515 Encounter for palliative care: Secondary | ICD-10-CM | POA: Diagnosis not present

## 2020-11-23 MED ORDER — LORAZEPAM 2 MG/ML IJ SOLN: 2.0000 mg | INTRAMUSCULAR | Status: DC | PRN

## 2020-11-23 MED ORDER — LORAZEPAM 2 MG/ML IJ SOLN
1.0000 mg | INTRAMUSCULAR | Status: DC
  Administered 2020-11-23 – 2020-11-27 (×24): 1 mg via INTRAVENOUS
  Filled 2020-11-23 (×26): qty 1

## 2020-11-23 NOTE — Plan of Care (Signed)
  Problem: Health Behavior/Discharge Planning: Goal: Ability to manage health-related needs will improve Outcome: Not Progressing   Problem: Activity: Goal: Risk for activity intolerance will decrease Outcome: Not Progressing   Problem: Nutrition: Goal: Adequate nutrition will be maintained Outcome: Not Progressing   

## 2020-11-23 NOTE — Progress Notes (Signed)
Pt sleeping most of the day. Ativan seems to help him rest a little more and be more comfortable. Pt not in distress, breathing regular and unlabored. He would not eat or drink anything for me. Patient was repositioned throughout the day.

## 2020-11-23 NOTE — Care Management Important Message (Signed)
Important Message  Patient Details  Name: Marcus Downs. MRN: [id number] Date of Birth: July 25, 1941   Medicare Important Message Given:  Yes     Othniel Maret P Kylinn Shropshire 11/23/2020, 11:40 AM

## 2020-11-23 NOTE — Plan of Care (Signed)
?  Problem: Education: ?Goal: Knowledge of General Education information will improve ?Description: Including pain rating scale, medication(s)/side effects and non-pharmacologic comfort measures ?Outcome: Not Progressing ?  ?Problem: Health Behavior/Discharge Planning: ?Goal: Ability to manage health-related needs will improve ?Outcome: Not Progressing ?  ?Problem: Clinical Measurements: ?Goal: Ability to maintain clinical measurements within normal limits will improve ?Outcome: Not Progressing ?  ?Problem: Activity: ?Goal: Risk for activity intolerance will decrease ?Outcome: Not Progressing ?  ?Problem: Nutrition: ?Goal: Adequate nutrition will be maintained ?Outcome: Not Progressing ?  ?

## 2020-11-23 NOTE — Progress Notes (Signed)
PROGRESS NOTE  Marcus Downs. DPO:[id number] DOB: 05/04/1941 DOA: 04-Nov-2020 PCP: Ria Bush, MD  HPI/Recap of past 24 hours: HPI from Dr Alfredia Ferguson Patient is a 80 year old African-American male with a past medical history significant for but not limited to vascular dementia, previous CVA with residual left-sided hemiplegia, essential hypertension, type 2 diabetes mellitus, dysphagia and hyperlipidemia as well as chronic urine retention with indwelling Foley catheter secondary to BPH and recent left femoral neck fracture status post left hemiarthroplasty on 08/08/2020 who presented from his nursing facility with altered mental status, agitation and acute kidney injury with electrolyte abnormalities with hyponatremia.  He was in septic shock and found to have an abscess around his arthroplasty hardware and an MSSA bacteremia and was deemed not a surgical candidate.  He was empirically treated with IV antibiotics for his MSSA infection and ended up having a left hip drain. He was transferred back to [facility] service 10/24/20. Overall his prognosis and clinical status continued to decline, in addition to poor oral intake, palliative care consulted, patient eventually made comfort care.    Today, patient noted to be staring at the ceiling, does not track, also appears to be reaching for something up in the air.Marland KitchenMarland KitchenRemains awake, but unresponsive and non-verbal    Assessment/Plan: Principal Problem:   MSSA bacteremia Active Problems:   Protein-calorie malnutrition, severe ([place])   Encounter for nasogastric (NG) tube placement   Severe sepsis (HCC)   Abscess of left hip   Hypernatremia   Abscess   Acute kidney injury (nontraumatic) (HCC)   Dehydration   Elevated liver function tests   Prosthetic hip infection (HCC)   Hyperkalemia   Terminal care   MSSA bacteremia/septic shock/MSSA left hip abscess/prosthetic hip infection/catheter associated UTI S/p IR guided abscess drain Initially on  pressors, weaned off Blood culture, urine culture, hip abscess, growing MSSA Echocardiogram without any vegetation S/p cefazolin and rifampicin Due to very poor prognosis and worsening clinical status, in addition to poor oral intake patient was switched to comfort care  Acute metabolic encephalopathy in the setting of history of CVA/vascular dementia Continued to decline, switched to comfort care  AKI/hypernatremia/metabolic acidosis Likely due to bacteremia, sepsis in addition to poor oral intake Comfort care  Electrolyte imbalance Hypocalcemia, hypomagnesemia, hypernatremia  Diabetes mellitus type 2, uncontrolled with hyperglycemia Episode of hypoglycemia Last A1c 8.1 Comfort care  Dysphagia/severe malnutrition Dietitian consulted Switched to comfort care        Malnutrition Type:  Nutrition Problem: Severe Malnutrition Etiology: chronic illness (dementia)   Malnutrition Characteristics:  Signs/Symptoms: severe fat depletion,severe muscle depletion,percent weight loss (15.4% weight loss in 9 months) Percent weight loss: 15.4 % (less than 9 months)   Nutrition Interventions:  Interventions: Glucerna shake,MVI,Magic cup,Calorie Count    Estimated body mass index is 21.08 kg/m as calculated from the following:   Height as of this encounter: 6' (1.829 m).   Weight as of this encounter: 70.5 kg.     Code Status: DNR  Family Communication: None at bedside  Disposition Plan: Status is: Inpatient  Remains inpatient appropriate because:Altered mental status, inpatient hospice placement   Dispo:  Patient From:  SNF  Planned Disposition: Residential Hospice  Expected discharge date: 11/24/2020  Medically stable for discharge:      Consultants:  Palliative Care  Infectious Diseases  Interventional Radiology  PCCM   Orthopedic Surgery  Nephrology   Procedures:   Left hip abscesss/pabscess drain to theleft thigh on  2020/11/04  Antimicrobials:  Stopped cefazolin + rimfapicin  DVT prophylaxis: None   Objective: Vitals:   11/20/20 0758 11/20/20 0758 11/21/20 0734 11/21/20 2303  BP: 125/61 125/61 (!) 163/66 (!) 178/62  Pulse: (!) 102 (!) 102 99 (!) 102  Resp: 16 16 16 16   Temp: 97.7 F (36.5 C) 97.7 F (36.5 C) 98.7 F (37.1 C) 98.4 F (36.9 C)  TempSrc: Axillary Oral Oral Oral  SpO2: 100% 100% 100% 100%  Weight:      Height:        Intake/Output Summary (Last 24 hours) at 11/23/2020 1427 Last data filed at 11/23/2020 0010 Gross per 24 hour  Intake --  Output 650 ml  Net -650 ml   Filed Weights   11/02/20 0825 11/05/20 0500 11/09/20 0451  Weight: 71.5 kg 72.8 kg 70.5 kg    Exam:  General: NAD   Cardiovascular: S1, S2 present  Respiratory: Coarse BS B/l  Abdomen: Soft, nontender, nondistended, bowel sounds present  Musculoskeletal: No bilateral pedal edema noted  Skin: Noted pressure ulcers  Psychiatry: Unable to assess   Data Reviewed: CBC: Recent Labs  Lab 11/17/20 0737 11/18/20 0258 11/19/20 0158  WBC 6.8 7.0 6.0  NEUTROABS 4.9 5.0 4.2  HGB 8.5* 9.3* 9.2*  HCT 24.6* 27.4* 27.2*  MCV 79.1* 78.5* 77.5*  PLT 224 236 242   Basic Metabolic Panel: Recent Labs  Lab 11/17/20 0232 11/18/20 0258 11/19/20 0158  NA 138 141 143  K 3.8 3.5 3.8  CL 110 114* 115*  CO2 16* 18* 18*  GLUCOSE 160* 127* 159*  BUN 33* 31* 29*  CREATININE 5.35* 5.11* 4.88*  CALCIUM 7.6* 7.6* 7.8*  MG 1.6* 1.8 2.0  PHOS 4.0 3.7 3.8   GFR: Estimated Creatinine Clearance: 12.4 mL/min (A) (by C-G formula based on SCr of 4.88 mg/dL (H)). Liver Function Tests: Recent Labs  Lab 11/17/20 0232 11/18/20 0258 11/19/20 0158  AST 26 24 34  ALT <5 5 <5  ALKPHOS 166* 166* 162*  BILITOT 0.5 0.3 0.7  PROT 6.6 6.7 6.9  ALBUMIN 1.9* 1.8* 2.2*   No results for input(s): LIPASE, AMYLASE in the last 168 hours. No results for input(s): AMMONIA in the last 168 hours. Coagulation Profile: No  results for input(s): INR, PROTIME in the last 168 hours. Cardiac Enzymes: No results for input(s): CKTOTAL, CKMB, CKMBINDEX, TROPONINI in the last 168 hours. BNP (last 3 results) No results for input(s): PROBNP in the last 8760 hours. HbA1C: No results for input(s): HGBA1C in the last 72 hours. CBG: Recent Labs  Lab 11/18/20 1139 11/18/20 1655 11/18/20 2242 11/19/20 0815 11/19/20 1203  GLUCAP 110* 130* 148* 152* 142*   Lipid Profile: No results for input(s): CHOL, HDL, LDLCALC, TRIG, CHOLHDL, LDLDIRECT in the last 72 hours. Thyroid Function Tests: No results for input(s): TSH, T4TOTAL, FREET4, T3FREE, THYROIDAB in the last 72 hours. Anemia Panel: No results for input(s): VITAMINB12, FOLATE, FERRITIN, TIBC, IRON, RETICCTPCT in the last 72 hours. Urine analysis:    Component Value Date/Time   COLORURINE AMBER (A) 11/14/2020 1136   APPEARANCEUR CLOUDY (A) 11/14/2020 1136   LABSPEC 1.009 11/14/2020 1136   PHURINE 6.0 11/14/2020 1136   GLUCOSEU NEGATIVE 11/14/2020 1136   HGBUR MODERATE (A) 11/14/2020 1136   BILIRUBINUR NEGATIVE 11/14/2020 1136   KETONESUR NEGATIVE 11/14/2020 1136   PROTEINUR 30 (A) 11/14/2020 1136   UROBILINOGEN 1.0 10/30/2013 2358   NITRITE NEGATIVE 11/14/2020 1136   LEUKOCYTESUR LARGE (A) 11/14/2020 1136   Sepsis Labs: @LABRCNTIP (procalcitonin:4,lacticidven:4)  ) Recent Results (from the past 240 hour(s))  Culture, Urine     Status: Abnormal   Collection Time: 11/14/20 10:37 AM   Specimen: Urine, Random  Result Value Ref Range Status   Specimen Description URINE, RANDOM  Final   Special Requests   Final    NONE Performed at [facility], [address] [place], [place] [address]    Culture >=100,000 COLONIES/mL YEAST (A)  Final   Report Status 11/15/2020 FINAL  Final      Studies: No results found.  Scheduled Meds: . Chlorhexidine Gluconate Cloth  6 each Topical Daily  . LORazepam  1 mg Intravenous Q4H  . metoprolol tartrate  5 mg  Intravenous Once    Continuous Infusions:   LOS: 33 days     Briant Cedar, MD [facility]  If 7PM-7AM, please contact night-coverage [email or web address] 11/23/2020, 2:27 PM

## 2020-11-23 NOTE — Progress Notes (Signed)
Palliative Medicine RN Note: Pt rec'd hydromorphone one time overnight. No other prns given.   Upon my arrival, pt is not responsive to my voice/calling his name/standing in his sight line. He is awake, staring toward the window, picking at the air and gesturing above the bedrail. He cannot be consoled. Dr Phillips Odor gave an order to schedule Ativan, first dose now, which I communicated to RN [place].  There is no bed available at [facility] today. PMT will continue to follow for symptoms.  Margret Chance Kaede Clendenen, RN, BSN, [facility] Palliative Medicine Team 11/23/2020 12:23 PM Office [phone]

## 2020-11-23 NOTE — Progress Notes (Signed)
[facility] ([facility]) Hospital Liaison note.    [facility] is unable to offer a room today. Hospital Liaison will follow up tomorrow or sooner if a room becomes available. Please do not hesitate to call with questions.    Thank you for the opportunity to participate in this patient's care.  Chrislyn King, BSN, RN [facility] Hospital Liaison (listed on AMION under Hospice/[facility])    [phone] (24h on call)     

## 2020-11-24 DIAGNOSIS — R7881 Bacteremia: Secondary | ICD-10-CM | POA: Diagnosis not present

## 2020-11-24 DIAGNOSIS — L02416 Cutaneous abscess of left lower limb: Secondary | ICD-10-CM | POA: Diagnosis not present

## 2020-11-24 DIAGNOSIS — L0291 Cutaneous abscess, unspecified: Secondary | ICD-10-CM | POA: Diagnosis not present

## 2020-11-24 DIAGNOSIS — N179 Acute kidney failure, unspecified: Secondary | ICD-10-CM | POA: Diagnosis not present

## 2020-11-24 NOTE — Progress Notes (Signed)
PROGRESS NOTE  Marcus Downs. MVH:[id number] DOB: 19-Jan-1941 DOA: 2020-11-14 PCP: [name], MD  HPI/Recap of past 24 hours: HPI from Dr Marland Mcalpine Patient is a 80 year old African-American male with a past medical history significant for but not limited to vascular dementia, previous CVA with residual left-sided hemiplegia, essential hypertension, type 2 diabetes mellitus, dysphagia and hyperlipidemia as well as chronic urine retention with indwelling Foley catheter secondary to BPH and recent left femoral neck fracture status post left hemiarthroplasty on 08/08/2020 who presented from his nursing facility with altered mental status, agitation and acute kidney injury with electrolyte abnormalities with hyponatremia.  He was in septic shock and found to have an abscess around his arthroplasty hardware and an MSSA bacteremia and was deemed not a surgical candidate.  He was empirically treated with IV antibiotics for his MSSA infection and ended up having a left hip drain. He was transferred back to [facility] service 10/24/20. Overall his prognosis and clinical status continued to decline, in addition to poor oral intake, palliative care consulted, patient eventually made comfort care.    Today, patient resting more comfortably, nonverbal, not arousable to voice.    Assessment/Plan: Principal Problem:   MSSA bacteremia Active Problems:   Protein-calorie malnutrition, severe (HCC)   Encounter for nasogastric (NG) tube placement   Severe sepsis (HCC)   Abscess of left hip   Hypernatremia   Abscess   Acute kidney injury (nontraumatic) (HCC)   Dehydration   Elevated liver function tests   Prosthetic hip infection (HCC)   Hyperkalemia   Terminal care   MSSA bacteremia/septic shock/MSSA left hip abscess/prosthetic hip infection/catheter associated UTI S/p IR guided abscess drain Initially on pressors, weaned off Blood culture, urine culture, hip abscess, growing MSSA Echocardiogram  without any vegetation S/p cefazolin and rifampicin Due to very poor prognosis and worsening clinical status, in addition to poor oral intake patient was switched to comfort care  Acute metabolic encephalopathy in the setting of history of CVA/vascular dementia Continued to decline, switched to comfort care  AKI/hypernatremia/metabolic acidosis Likely due to bacteremia, sepsis in addition to poor oral intake Comfort care  Electrolyte imbalance Hypocalcemia, hypomagnesemia, hypernatremia  Diabetes mellitus type 2, uncontrolled with hyperglycemia Episode of hypoglycemia Last A1c 8.1 Comfort care  Dysphagia/severe malnutrition Dietitian consulted Switched to comfort care        Malnutrition Type:  Nutrition Problem: Severe Malnutrition Etiology: chronic illness (dementia)   Malnutrition Characteristics:  Signs/Symptoms: severe fat depletion,severe muscle depletion,percent weight loss (15.4% weight loss in 9 months) Percent weight loss: 15.4 % (less than 9 months)   Nutrition Interventions:  Interventions: Glucerna shake,MVI,Magic cup,Calorie Count    Estimated body mass index is 21.08 kg/m as calculated from the following:   Height as of this encounter: 6' (1.829 m).   Weight as of this encounter: 70.5 kg.     Code Status: DNR  Family Communication: None at bedside  Disposition Plan: Status is: Inpatient  Remains inpatient appropriate because:Altered mental status, inpatient hospice placement   Dispo:  Patient From:  SNF  Planned Disposition: Residential Hospice  Expected discharge date: 11/24/2020  Medically stable for discharge:      Consultants:  Palliative Care  Infectious Diseases  Interventional Radiology  PCCM   Orthopedic Surgery  Nephrology   Procedures:   Left hip abscesss/pabscess drain to theleft thigh on 14-Nov-2020  Antimicrobials:  Stopped cefazolin + rimfapicin  DVT prophylaxis: None   Objective: Vitals:    11/20/20 8413 11/20/20 0758 11/21/20 0734 11/21/20 2303  BP: 125/61 125/61 (!) 163/66 (!) 178/62  Pulse: (!) 102 (!) 102 99 (!) 102  Resp: 16 16 16 16   Temp: 97.7 F (36.5 C) 97.7 F (36.5 C) 98.7 F (37.1 C) 98.4 F (36.9 C)  TempSrc: Axillary Oral Oral Oral  SpO2: 100% 100% 100% 100%  Weight:      Height:        Intake/Output Summary (Last 24 hours) at 11/24/2020 1327 Last data filed at 11/24/2020 0500 Gross per 24 hour  Intake --  Output 353 ml  Net -353 ml   Filed Weights   11/02/20 0825 11/05/20 0500 11/09/20 0451  Weight: 71.5 kg 72.8 kg 70.5 kg    Exam:  General: NAD   Cardiovascular: S1, S2 present  Respiratory: Coarse BS B/l  Abdomen: Soft, nontender, nondistended, bowel sounds present  Musculoskeletal: No bilateral pedal edema noted  Skin: Noted pressure ulcers  Psychiatry: Unable to assess   Data Reviewed: CBC: Recent Labs  Lab 11/18/20 0258 11/19/20 0158  WBC 7.0 6.0  NEUTROABS 5.0 4.2  HGB 9.3* 9.2*  HCT 27.4* 27.2*  MCV 78.5* 77.5*  PLT 236 093   Basic Metabolic Panel: Recent Labs  Lab 11/18/20 0258 11/19/20 0158  NA 141 143  K 3.5 3.8  CL 114* 115*  CO2 18* 18*  GLUCOSE 127* 159*  BUN 31* 29*  CREATININE 5.11* 4.88*  CALCIUM 7.6* 7.8*  MG 1.8 2.0  PHOS 3.7 3.8   GFR: Estimated Creatinine Clearance: 12.4 mL/min (A) (by C-G formula based on SCr of 4.88 mg/dL (H)). Liver Function Tests: Recent Labs  Lab 11/18/20 0258 11/19/20 0158  AST 24 34  ALT 5 <5  ALKPHOS 166* 162*  BILITOT 0.3 0.7  PROT 6.7 6.9  ALBUMIN 1.8* 2.2*   No results for input(s): LIPASE, AMYLASE in the last 168 hours. No results for input(s): AMMONIA in the last 168 hours. Coagulation Profile: No results for input(s): INR, PROTIME in the last 168 hours. Cardiac Enzymes: No results for input(s): CKTOTAL, CKMB, CKMBINDEX, TROPONINI in the last 168 hours. BNP (last 3 results) No results for input(s): PROBNP in the last 8760 hours. HbA1C: No results  for input(s): HGBA1C in the last 72 hours. CBG: Recent Labs  Lab 11/18/20 1139 11/18/20 1655 11/18/20 2242 11/19/20 0815 11/19/20 1203  GLUCAP 110* 130* 148* 152* 142*   Lipid Profile: No results for input(s): CHOL, HDL, LDLCALC, TRIG, CHOLHDL, LDLDIRECT in the last 72 hours. Thyroid Function Tests: No results for input(s): TSH, T4TOTAL, FREET4, T3FREE, THYROIDAB in the last 72 hours. Anemia Panel: No results for input(s): VITAMINB12, FOLATE, FERRITIN, TIBC, IRON, RETICCTPCT in the last 72 hours. Urine analysis:    Component Value Date/Time   COLORURINE AMBER (A) 11/14/2020 1136   APPEARANCEUR CLOUDY (A) 11/14/2020 1136   LABSPEC 1.009 11/14/2020 1136   PHURINE 6.0 11/14/2020 1136   GLUCOSEU NEGATIVE 11/14/2020 1136   HGBUR MODERATE (A) 11/14/2020 1136   BILIRUBINUR NEGATIVE 11/14/2020 1136   KETONESUR NEGATIVE 11/14/2020 1136   PROTEINUR 30 (A) 11/14/2020 1136   UROBILINOGEN 1.0 10/30/2013 2358   NITRITE NEGATIVE 11/14/2020 1136   LEUKOCYTESUR LARGE (A) 11/14/2020 1136   Sepsis Labs: @LABRCNTIP (procalcitonin:4,lacticidven:4)  ) No results found for this or any previous visit (from the past 240 hour(s)).    Studies: No results found.  Scheduled Meds: . Chlorhexidine Gluconate Cloth  6 each Topical Daily  . LORazepam  1 mg Intravenous Q4H  . metoprolol tartrate  5 mg Intravenous Once  Continuous Infusions:   LOS: 34 days     Briant Cedar, MD [facility]  If 7PM-7AM, please contact night-coverage [email or web address] 11/24/2020, 1:27 PM

## 2020-11-24 NOTE — Plan of Care (Signed)
  Problem: Coping: Goal: Level of anxiety will decrease Outcome: Progressing   Problem: Safety: Goal: Ability to remain free from injury will improve Outcome: Progressing   Problem: Skin Integrity: Goal: Risk for impaired skin integrity will decrease Outcome: Progressing   

## 2020-11-24 NOTE — Progress Notes (Signed)
[facility] ([facility]) Hospital Liaison note.    [facility] is unable to offer a room today. Hospital Liaison will follow up tomorrow or sooner if a room becomes available. Please do not hesitate to call with questions.    Thank you for the opportunity to participate in this patient's care.  Chrislyn King, BSN, RN [facility] Hospital Liaison (listed on AMION under Hospice/[facility])    [phone] (24h on call)     

## 2020-11-25 DIAGNOSIS — N179 Acute kidney failure, unspecified: Secondary | ICD-10-CM | POA: Diagnosis not present

## 2020-11-25 DIAGNOSIS — Z515 Encounter for palliative care: Secondary | ICD-10-CM | POA: Diagnosis not present

## 2020-11-25 DIAGNOSIS — L02416 Cutaneous abscess of left lower limb: Secondary | ICD-10-CM | POA: Diagnosis not present

## 2020-11-25 DIAGNOSIS — R7881 Bacteremia: Secondary | ICD-10-CM | POA: Diagnosis not present

## 2020-11-25 DIAGNOSIS — B9561 Methicillin susceptible Staphylococcus aureus infection as the cause of diseases classified elsewhere: Secondary | ICD-10-CM | POA: Diagnosis not present

## 2020-11-25 DIAGNOSIS — L0291 Cutaneous abscess, unspecified: Secondary | ICD-10-CM | POA: Diagnosis not present

## 2020-11-25 NOTE — Progress Notes (Signed)
PROGRESS NOTE  Marcus Downs. XNA:[id number] DOB: 1941/11/05 DOA: 2020/11/05 PCP: [name], MD  HPI/Recap of past 24 hours: HPI from Dr Marland Mcalpine Patient is a 80 year old African-American male with a past medical history significant for but not limited to vascular dementia, previous CVA with residual left-sided hemiplegia, essential hypertension, type 2 diabetes mellitus, dysphagia and hyperlipidemia as well as chronic urine retention with indwelling Foley catheter secondary to BPH and recent left femoral neck fracture status post left hemiarthroplasty on 08/08/2020 who presented from his nursing facility with altered mental status, agitation and acute kidney injury with electrolyte abnormalities with hyponatremia.  He was in septic shock and found to have an abscess around his arthroplasty hardware and an MSSA bacteremia and was deemed not a surgical candidate.  He was empirically treated with IV antibiotics for his MSSA infection and ended up having a left hip drain. He was transferred back to [facility] service 10/24/20. Overall his prognosis and clinical status continued to decline, in addition to poor oral intake, palliative care consulted, patient eventually made comfort care.    Today, resting more comfortably, unarousable to voice or touch.  Awaiting bed availability at [facility], of note, there may be no beds for several days as per Liaison    Assessment/Plan: Principal Problem:   MSSA bacteremia Active Problems:   Protein-calorie malnutrition, severe (HCC)   Encounter for nasogastric (NG) tube placement   Severe sepsis (HCC)   Abscess of left hip   Hypernatremia   Abscess   Acute kidney injury (nontraumatic) (HCC)   Dehydration   Elevated liver function tests   Prosthetic hip infection (HCC)   Hyperkalemia   Terminal care   Palliative care by specialist   MSSA bacteremia/septic shock/MSSA left hip abscess/prosthetic hip infection/catheter associated UTI S/p IR guided  abscess drain Initially on pressors, weaned off Blood culture, urine culture, hip abscess, growing MSSA Echocardiogram without any vegetation S/p cefazolin and rifampicin Due to very poor prognosis and worsening clinical status, in addition to poor oral intake patient was switched to comfort care  Acute metabolic encephalopathy in the setting of history of CVA/vascular dementia Continued to decline, switched to comfort care  AKI/hypernatremia/metabolic acidosis Likely due to bacteremia, sepsis in addition to poor oral intake Comfort care  Electrolyte imbalance Hypocalcemia, hypomagnesemia, hypernatremia  Diabetes mellitus type 2, uncontrolled with hyperglycemia Episode of hypoglycemia Last A1c 8.1 Comfort care  Dysphagia/severe malnutrition Dietitian consulted Switched to comfort care        Malnutrition Type:  Nutrition Problem: Severe Malnutrition Etiology: chronic illness (dementia)   Malnutrition Characteristics:  Signs/Symptoms: severe fat depletion,severe muscle depletion,percent weight loss (15.4% weight loss in 9 months) Percent weight loss: 15.4 % (less than 9 months)   Nutrition Interventions:  Interventions: Glucerna shake,MVI,Magic cup,Calorie Count    Estimated body mass index is 21.08 kg/m as calculated from the following:   Height as of this encounter: 6' (1.829 m).   Weight as of this encounter: 70.5 kg.     Code Status: DNR  Family Communication: None at bedside  Disposition Plan: Status is: Inpatient  Remains inpatient appropriate because:Altered mental status, inpatient hospice placement   Dispo:  Patient From:  SNF  Planned Disposition: Residential Hospice  Expected discharge date: 11/28/19  Medically stable for discharge:      Consultants:  Palliative Care  Infectious Diseases  Interventional Radiology  PCCM   Orthopedic Surgery  Nephrology   Procedures:   Left hip abscesss/pabscess drain to theleft thigh  on 11/05/20  Antimicrobials:  Stopped cefazolin + rimfapicin  DVT prophylaxis: None   Objective: Vitals:   11/20/20 0758 11/21/20 0734 11/21/20 2303 11/25/20 0432  BP:  (!) 163/66 (!) 178/62 (!) 102/58  Pulse: (!) 102 99 (!) 102 (!) 118  Resp: 16 16 16 20   Temp: 97.7 F (36.5 C) 98.7 F (37.1 C) 98.4 F (36.9 C) 97.7 F (36.5 C)  TempSrc: Oral Oral Oral Axillary  SpO2: 100% 100% 100% 93%  Weight:      Height:       No intake or output data in the 24 hours ending 11/25/20 1434 Filed Weights   11/02/20 0825 11/05/20 0500 11/09/20 0451  Weight: 71.5 kg 72.8 kg 70.5 kg    Exam:  General: NAD, appears comfortable, unarousable to voice or touch  Cardiovascular: S1, S2 present   Data Reviewed: CBC: Recent Labs  Lab 11/19/20 0158  WBC 6.0  NEUTROABS 4.2  HGB 9.2*  HCT 27.2*  MCV 77.5*  PLT 242   Basic Metabolic Panel: Recent Labs  Lab 11/19/20 0158  NA 143  K 3.8  CL 115*  CO2 18*  GLUCOSE 159*  BUN 29*  CREATININE 4.88*  CALCIUM 7.8*  MG 2.0  PHOS 3.8   GFR: Estimated Creatinine Clearance: 12.4 mL/min (A) (by C-G formula based on SCr of 4.88 mg/dL (H)). Liver Function Tests: Recent Labs  Lab 11/19/20 0158  AST 34  ALT <5  ALKPHOS 162*  BILITOT 0.7  PROT 6.9  ALBUMIN 2.2*   No results for input(s): LIPASE, AMYLASE in the last 168 hours. No results for input(s): AMMONIA in the last 168 hours. Coagulation Profile: No results for input(s): INR, PROTIME in the last 168 hours. Cardiac Enzymes: No results for input(s): CKTOTAL, CKMB, CKMBINDEX, TROPONINI in the last 168 hours. BNP (last 3 results) No results for input(s): PROBNP in the last 8760 hours. HbA1C: No results for input(s): HGBA1C in the last 72 hours. CBG: Recent Labs  Lab 11/18/20 1655 11/18/20 2242 11/19/20 0815 11/19/20 1203  GLUCAP 130* 148* 152* 142*   Lipid Profile: No results for input(s): CHOL, HDL, LDLCALC, TRIG, CHOLHDL, LDLDIRECT in the last 72 hours. Thyroid  Function Tests: No results for input(s): TSH, T4TOTAL, FREET4, T3FREE, THYROIDAB in the last 72 hours. Anemia Panel: No results for input(s): VITAMINB12, FOLATE, FERRITIN, TIBC, IRON, RETICCTPCT in the last 72 hours. Urine analysis:    Component Value Date/Time   COLORURINE AMBER (A) 11/14/2020 1136   APPEARANCEUR CLOUDY (A) 11/14/2020 1136   LABSPEC 1.009 11/14/2020 1136   PHURINE 6.0 11/14/2020 1136   GLUCOSEU NEGATIVE 11/14/2020 1136   HGBUR MODERATE (A) 11/14/2020 1136   BILIRUBINUR NEGATIVE 11/14/2020 1136   KETONESUR NEGATIVE 11/14/2020 1136   PROTEINUR 30 (A) 11/14/2020 1136   UROBILINOGEN 1.0 10/30/2013 2358   NITRITE NEGATIVE 11/14/2020 1136   LEUKOCYTESUR LARGE (A) 11/14/2020 1136   Sepsis Labs: @LABRCNTIP (procalcitonin:4,lacticidven:4)  ) No results found for this or any previous visit (from the past 240 hour(s)).    Studies: No results found.  Scheduled Meds: . Chlorhexidine Gluconate Cloth  6 each Topical Daily  . LORazepam  1 mg Intravenous Q4H  . metoprolol tartrate  5 mg Intravenous Once    Continuous Infusions:   LOS: 35 days     11/16/2020, MD [facility]  If 7PM-7AM, please contact night-coverage [email or web address] 11/25/2020, 2:34 PM

## 2020-11-25 NOTE — Progress Notes (Signed)
Civil engineer, contracting ([facility])  There is not bed availability at [facility] today. There likely will not be one for several days.  [facility] will update hospital and family once our bed status changes.  Wallis Bamberg RN, BSN, CCRN [facility] Liaison

## 2020-11-25 NOTE — Progress Notes (Signed)
   Palliative Medicine Inpatient Follow Up Note  HPI: 80 y.o.malewith past medical history of stroke, vascular dementia, hypertension, hyperlipidemia, diabetes, GERD, dysphagia, chronic urinary retention with indwelling catheter due to BPH, L hemiarthroplasty 09/2021ad[facility] on 12[facility] and sepsis.CT A/P found left hip abscess with MSSA bacteremia. Overall prognosis poor. Palliative care requested to continue goals of care conversations with family. He has failed to thrive, no longer eating or drinking.  Today's Discussion (11/25/2020): Patient assessed at bedside.  He appears comfortable in no acute distress.  He received 1 mg of Ativan this morning otherwise his symptoms have been reasonably controlled.  There is no family at bedside at the time of assessment.  Objective Assessment: Vital Signs Vitals:   11/21/20 2303 11/25/20 0432  BP:  (!) 102/58  Pulse: (!) 102 (!) 118  Resp: 16 20  Temp: 98.4 F (36.9 C) 97.7 F (36.5 C)  SpO2: 100% 93%   No intake or output data in the 24 hours ending 11/25/20 1205 Last Weight  Most recent update: 11/09/2020  4:51 AM   Weight  70.5 kg (155 lb 6.8 oz)           SUMMARY OF RECOMMENDATIONS    Full comfort measures only  TOC referral to request eval for placement at [facility]  Ongoing palliative care support  Time Spent: 15 Greater than 50% of the time was spent in counseling and coordination of care ______________________________________________________________________________________ Lamarr Lulas [place] Palliative Medicine Team Team Cell Phone: [phone] Please utilize secure chat with additional questions, if there is no response within 30 minutes please call the above phone number  Palliative Medicine Team providers are available by phone from 7am to 7pm daily and can be reached through the team cell phone.  Should this patient require assistance outside of these hours, please call the  patient's attending physician.

## 2020-11-26 DIAGNOSIS — Z7189 Other specified counseling: Secondary | ICD-10-CM | POA: Diagnosis not present

## 2020-11-26 DIAGNOSIS — Z66 Do not resuscitate: Secondary | ICD-10-CM

## 2020-11-26 DIAGNOSIS — R7881 Bacteremia: Secondary | ICD-10-CM | POA: Diagnosis not present

## 2020-11-26 DIAGNOSIS — Z515 Encounter for palliative care: Secondary | ICD-10-CM | POA: Diagnosis not present

## 2020-11-26 MED ORDER — HYDROMORPHONE HCL 1 MG/ML IJ SOLN
1.0000 mg | INTRAMUSCULAR | Status: DC
  Administered 2020-11-26 – 2020-11-27 (×8): 1 mg via INTRAVENOUS
  Filled 2020-11-26 (×8): qty 1

## 2020-11-26 NOTE — Progress Notes (Signed)
PROGRESS NOTE  Marcus Downs. ZOX:[id number] DOB: 11-13-1941 DOA: 11/14/2020 PCP: [name], MD  HPI/Recap of past 24 hours: HPI from Dr Marland Mcalpine Patient is a 80 year old African-American male with a past medical history significant for but not limited to vascular dementia, previous CVA with residual left-sided hemiplegia, essential hypertension, type 2 diabetes mellitus, dysphagia and hyperlipidemia as well as chronic urine retention with indwelling Foley catheter secondary to BPH and recent left femoral neck fracture status post left hemiarthroplasty on 08/08/2020 who presented from his nursing facility with altered mental status, agitation and acute kidney injury with electrolyte abnormalities with hyponatremia.  He was in septic shock and found to have an abscess around his arthroplasty hardware and an MSSA bacteremia and was deemed not a surgical candidate.  He was empirically treated with IV antibiotics for his MSSA infection and ended up having a left hip drain. He was transferred back to [facility] service 10/24/20. Overall his prognosis and clinical status continued to decline, in addition to poor oral intake, palliative care consulted, patient eventually made comfort care.     Today, patient remains unarousable. Anticipate hospital death.     Assessment/Plan: Principal Problem:   MSSA bacteremia Active Problems:   Protein-calorie malnutrition, severe (HCC)   Encounter for nasogastric (NG) tube placement   Severe sepsis (HCC)   Abscess of left hip   Hypernatremia   Abscess   Acute kidney injury (nontraumatic) (HCC)   Dehydration   Elevated liver function tests   Prosthetic hip infection (HCC)   Hyperkalemia   Terminal care   Palliative care by specialist   MSSA bacteremia/septic shock/MSSA left hip abscess/prosthetic hip infection/catheter associated UTI S/p IR guided abscess drain Initially on pressors, weaned off Blood culture, urine culture, hip abscess, growing  MSSA Echocardiogram without any vegetation S/p cefazolin and rifampicin Due to very poor prognosis and worsening clinical status, in addition to poor oral intake patient was switched to comfort care  Acute metabolic encephalopathy in the setting of history of CVA/vascular dementia Continued to decline, switched to comfort care  AKI/hypernatremia/metabolic acidosis Likely due to bacteremia, sepsis in addition to poor oral intake Comfort care  Electrolyte imbalance Hypocalcemia, hypomagnesemia, hypernatremia  Diabetes mellitus type 2, uncontrolled with hyperglycemia Episode of hypoglycemia Last A1c 8.1 Comfort care  Dysphagia/severe malnutrition Dietitian consulted Switched to comfort care        Malnutrition Type:  Nutrition Problem: Severe Malnutrition Etiology: chronic illness (dementia)   Malnutrition Characteristics:  Signs/Symptoms: severe fat depletion,severe muscle depletion,percent weight loss (15.4% weight loss in 9 months) Percent weight loss: 15.4 % (less than 9 months)   Nutrition Interventions:  Interventions: Glucerna shake,MVI,Magic cup,Calorie Count    Estimated body mass index is 21.08 kg/m as calculated from the following:   Height as of this encounter: 6' (1.829 m).   Weight as of this encounter: 70.5 kg.     Code Status: DNR  Family Communication: None at bedside  Disposition Plan: Status is: Inpatient  Remains inpatient appropriate because: Anticipate hospital death   Dispo:  Patient From:  SNF  Planned Disposition: Anticipate hospital death  Expected discharge date: None  Medically stable for discharge: Anticipate hospital death    Consultants:  Palliative Care  Infectious Diseases  Interventional Radiology  PCCM   Orthopedic Surgery  Nephrology   Procedures:   Left hip abscesss/pabscess drain to theleft thigh on 14-Nov-2020  Antimicrobials:  Stopped cefazolin + rimfapicin  DVT  prophylaxis: None   Objective: Vitals:   11/21/20 0734 11/21/20  2303 11/25/20 0432 11/26/20 0446  BP: (!) 163/66 (!) 178/62 (!) 102/58 (!) 102/55  Pulse: 99 (!) 102 (!) 118 (!) 108  Resp: 16 16 20 16   Temp: 98.7 F (37.1 C) 98.4 F (36.9 C) 97.7 F (36.5 C)   TempSrc: Oral Oral Axillary   SpO2: 100% 100% 93% 92%  Weight:      Height:        Intake/Output Summary (Last 24 hours) at 11/26/2020 1345 Last data filed at 11/26/2020 0542 Gross per 24 hour  Intake -  Output 25 ml  Net -25 ml   Filed Weights   11/02/20 0825 11/05/20 0500 11/09/20 0451  Weight: 71.5 kg 72.8 kg 70.5 kg    Exam:  General: NAD, appears comfortable, unarousable to voice or touch  Cardiovascular: S1, S2 present   Data Reviewed: CBC: No results for input(s): WBC, NEUTROABS, HGB, HCT, MCV, PLT in the last 168 hours. Basic Metabolic Panel: No results for input(s): NA, K, CL, CO2, GLUCOSE, BUN, CREATININE, CALCIUM, MG, PHOS in the last 168 hours. GFR: Estimated Creatinine Clearance: 12.4 mL/min (A) (by C-G formula based on SCr of 4.88 mg/dL (H)). Liver Function Tests: No results for input(s): AST, ALT, ALKPHOS, BILITOT, PROT, ALBUMIN in the last 168 hours. No results for input(s): LIPASE, AMYLASE in the last 168 hours. No results for input(s): AMMONIA in the last 168 hours. Coagulation Profile: No results for input(s): INR, PROTIME in the last 168 hours. Cardiac Enzymes: No results for input(s): CKTOTAL, CKMB, CKMBINDEX, TROPONINI in the last 168 hours. BNP (last 3 results) No results for input(s): PROBNP in the last 8760 hours. HbA1C: No results for input(s): HGBA1C in the last 72 hours. CBG: No results for input(s): GLUCAP in the last 168 hours. Lipid Profile: No results for input(s): CHOL, HDL, LDLCALC, TRIG, CHOLHDL, LDLDIRECT in the last 72 hours. Thyroid Function Tests: No results for input(s): TSH, T4TOTAL, FREET4, T3FREE, THYROIDAB in the last 72 hours. Anemia Panel: No results for  input(s): VITAMINB12, FOLATE, FERRITIN, TIBC, IRON, RETICCTPCT in the last 72 hours. Urine analysis:    Component Value Date/Time   COLORURINE AMBER (A) 11/14/2020 1136   APPEARANCEUR CLOUDY (A) 11/14/2020 1136   LABSPEC 1.009 11/14/2020 1136   PHURINE 6.0 11/14/2020 1136   GLUCOSEU NEGATIVE 11/14/2020 1136   HGBUR MODERATE (A) 11/14/2020 1136   BILIRUBINUR NEGATIVE 11/14/2020 1136   KETONESUR NEGATIVE 11/14/2020 1136   PROTEINUR 30 (A) 11/14/2020 1136   UROBILINOGEN 1.0 10/30/2013 2358   NITRITE NEGATIVE 11/14/2020 1136   LEUKOCYTESUR LARGE (A) 11/14/2020 1136   Sepsis Labs: @LABRCNTIP (procalcitonin:4,lacticidven:4)  ) No results found for this or any previous visit (from the past 240 hour(s)).    Studies: No results found.  Scheduled Meds: . Chlorhexidine Gluconate Cloth  6 each Topical Daily  .  HYDROmorphone (DILAUDID) injection  1 mg Intravenous Q4H  . LORazepam  1 mg Intravenous Q4H  . metoprolol tartrate  5 mg Intravenous Once    Continuous Infusions:   LOS: 36 days     11/16/2020, MD [facility]  If 7PM-7AM, please contact night-coverage [email or web address] 11/26/2020, 1:45 PM

## 2020-11-26 NOTE — Progress Notes (Signed)
[facility])  Mr. Walling is currently on our list of referrals for [facility]. Patient was offered a bed this morning but due to further decline and unstable transport thoughts, the patient will continue to stay in the hospital for comfort care. We will keep on list as an FYI and follow patient for discharge needs. PMT and TOC aware.  Please feel free to call with any related hospice questions.  Yolande Jolly, BSN, [employer] [phone]

## 2020-11-26 NOTE — Progress Notes (Signed)
   Palliative Medicine Inpatient Follow Up Note  HPI: 80 y.o.malewith past medical history of stroke, vascular dementia, hypertension, hyperlipidemia, diabetes, GERD, dysphagia, chronic urinary retention with indwelling catheter due to BPH, L hemiarthroplasty 09/2021admitted on 2021/12/31from [facility]with[facility] and sepsis.CT A/P found left hip abscess with MSSA bacteremia. Overall prognosis poor. Palliative care requested to continue goals of care conversations with family. He has failed to thrive, no longer eating or drinking.  Today's Discussion (11/26/2020): Patient assessed at bedside.  He appears to be wincing this morning, exhibiting signs of discomfort.     He remains on 1 mg of Ativan Q4H, have also added dilaudid 1mg  IV Q4H to aid in better symptom relief.  There is no family at bedside at the time of assessment.  Awaiting a [facility] bed - as of yesterday there was not one available.  Objective Assessment: Vital Signs Vitals:   11/25/20 0432 11/26/20 0446  BP: (!) 102/58 (!) 102/55  Pulse: (!) 118 (!) 108  Resp: 20 16  Temp: 97.7 F (36.5 C)   SpO2: 93% 92%    Intake/Output Summary (Last 24 hours) at 11/26/2020 01/24/2021 Last data filed at 11/26/2020 0542 Gross per 24 hour  Intake --  Output 25 ml  Net -25 ml   Last Weight  Most recent update: 11/09/2020  4:51 AM   Weight  70.5 kg (155 lb 6.8 oz)           SUMMARY OF RECOMMENDATIONS    Full comfort measures   Medications per MAR - added Dilaudid ATC  Await a [facility] bed  Ongoing palliative care support  Time Spent: 15 Greater than 50% of the time was spent in counseling and coordination of care ______________________________________________________________________________________ 11/11/2020 [facility] Palliative Medicine Team Team Cell Phone: [phone] Please utilize secure chat with additional questions, if there is no response within 30 minutes please call the above phone number  Palliative  Medicine Team providers are available by phone from 7am to 7pm daily and can be reached through the team cell phone.  Should this patient require assistance outside of these hours, please call the patient's attending physician.

## 2020-11-26 NOTE — TOC Progression Note (Signed)
Transition of Care (TOC) - Progression Note    Patient Details  Name: Thelma Viana. MRN: [id number] Date of Birth: [birth year]  Transition of Care [facility]) CM/SW Contact  Eduard Roux, [place] Phone Number: 11/26/2020, 9:32 AM  Clinical Narrative:     Kevin Fenton to follow up on bed availability for [facility]- they will call CSW back   Antony Blackbird, MSW, LCSW Clinical Social Worker   Expected Discharge Plan: Hospice Medical Facility Barriers to Discharge: Hospice Bed not available  Expected Discharge Plan and Services Expected Discharge Plan: Hospice Medical Facility In-house Referral: NA Discharge Planning Services: CM Consult   Living arrangements for the past 2 months: Single Family Home                 DME Arranged: N/A DME Agency: NA       HH Arranged: NA HH Agency: NA         Social Determinants of Health (SDOH) Interventions    Readmission Risk Interventions Readmission Risk Prevention Plan 11/01/2020  Transportation Screening Complete  PCP or Specialist Appt within 3-5 Days Complete  HRI or Home Care Consult Not Complete  HRI or Home Care Consult comments n/a returning back to snf  Social Work Consult for Recovery Care Planning/Counseling Complete  Palliative Care Screening Complete  Medication Review Oceanographer) Referral to Pharmacy  Some recent data might be hidden

## 2020-11-26 NOTE — Plan of Care (Signed)

## 2020-12-19 NOTE — Death Summary Note (Signed)
DEATH SUMMARY   Patient Details  Name: Marcus Downs. MRN: [id number] DOB: November 12, 1941  Admission/Discharge Information   Admit Date:  11/14/2020  Date of Death: Date of Death: 2020/12/21  Time of Death: Time of Death: 03/21/29  Length of Stay: Mar 21, 2036  Referring Physician: [name], MD   Reason(s) for Hospitalization  Septic shock  Diagnoses  Preliminary cause of death: Septic shock  Secondary Diagnoses (including complications and co-morbidities):  Principal Problem:   MSSA bacteremia Active Problems:   Protein-calorie malnutrition, severe (HCC)   Encounter for nasogastric (NG) tube placement   Severe sepsis (HCC)   Abscess of left hip   Hypernatremia   Abscess   Acute kidney injury (nontraumatic) (HCC)   Dehydration   Elevated liver function tests   Prosthetic hip infection (HCC)   Hyperkalemia   Terminal care   Palliative care by specialist   Brief Hospital Course (including significant findings, care, treatment, and services provided and events leading to death)  Marcus Downs. is a 80 y.o. year old male with a past medical history significant for but not limited to vascular dementia, previous CVA with residual left-sided hemiplegia, essential hypertension, type 2 diabetes mellitus, dysphagia and hyperlipidemia as well as chronic urine retention with indwelling Foley catheter secondary to BPH and recent left femoral neck fracture status post left hemiarthroplasty on 08/08/2020 who presented from his nursing facility with altered mental status, agitation and acute kidney injury with electrolyte abnormalities with hyponatremia. He was in septic shock and found to have an abscess around his arthroplasty hardware and an MSSA bacteremia and was deemed not a surgical candidate. He was empirically treated with IV antibiotics for his MSSA infection and ended up having a left hip drain. He was transferred back to [facility] service 10/24/20. Overall his prognosis and clinical status  continued to decline, in addition to poor oral intake, palliative care consulted, patient eventually made comfort care.   Hospital course as follows:  MSSA bacteremia/septic shock/MSSA left hip abscess/prosthetic hip infection/catheter associated UTI S/p IR guided abscess drain Initially on pressors, weaned off Blood culture, urine culture, hip abscess, growing MSSA Echocardiogram without any vegetation S/p cefazolin and rifampicin Due to very poor prognosis and worsening clinical status, in addition to poor oral intake patient was switched to comfort care  Acute metabolic encephalopathy in the setting of history of CVA/vascular dementia Continued to decline, switched to comfort care  AKI/hypernatremia/metabolic acidosis Likely due to bacteremia, sepsis in addition to poor oral intake Comfort care  Electrolyte imbalance Hypocalcemia, hypomagnesemia, hypernatremia  Diabetes mellitus type 2, uncontrolled with hyperglycemia Episode of hypoglycemia Last A1c 8.1 Comfort care  Dysphagia/severe malnutrition Dietitian consulted Switched to comfort care    Pertinent Labs and Studies  Significant Diagnostic Studies US RENAL  Result Date: 11/14/2020 CLINICAL DATA:  Acute kidney injury EXAM: RENAL / URINARY TRACT ULTRASOUND COMPLETE COMPARISON:  CT abdomen and pelvis November 14, 2020 FINDINGS: Right Kidney: Renal measurements: 10.9 x 4.4 x 5.9 cm = volume: 148.4 mL. Echogenicity and renal cortical thickness are within normal limits. No mass, perinephric fluid, or hydronephrosis visualized. No sonographically demonstrable calculus or ureterectasis. Left Kidney: Renal measurements: 9.3 x 5.6 x 4.9 cm = volume: 134.8 mL. Echogenicity and renal cortical thickness are within normal limits. No mass, perinephric fluid, or hydronephrosis visualized. No sonographically demonstrable calculus or ureterectasis. Bladder: Foley catheter within urinary bladder. Moderate urine seen in the bladder  without bladder wall thickening. Other: Prostate measures 5.0 x 4.4 x 3.7 cm with  a diffusely inhomogeneous appearance of the prostate. Several prostatic calculi evident. IMPRESSION: 1.  Normal appearing kidneys bilaterally. 2. Moderate urine in the bladder despite presence of Foley catheter. No lesions involving the urinary bladder evident. 3. Inhomogeneous prostate parenchyma with rather prominent size of prostate. Advise clinical assessment and PSA evaluation given this appearance. Electronically Signed   By: Bretta Bang III M.D.   On: 11/14/2020 09:48    Microbiology No results found for this or any previous visit (from the past 240 hour(s)).  Lab Basic Metabolic Panel: No results for input(s): NA, K, CL, CO2, GLUCOSE, BUN, CREATININE, CALCIUM, MG, PHOS in the last 168 hours. Liver Function Tests: No results for input(s): AST, ALT, ALKPHOS, BILITOT, PROT, ALBUMIN in the last 168 hours. No results for input(s): LIPASE, AMYLASE in the last 168 hours. No results for input(s): AMMONIA in the last 168 hours. CBC: No results for input(s): WBC, NEUTROABS, HGB, HCT, MCV, PLT in the last 168 hours. Cardiac Enzymes: No results for input(s): CKTOTAL, CKMB, CKMBINDEX, TROPONINI in the last 168 hours. Sepsis Labs: No results for input(s): PROCALCITON, WBC, LATICACIDVEN in the last 168 hours.  Procedures/Operations    Left hip abscesss/pabscess drain to theleft thigh on 11/11/2020    Monica Martinez Bronte Kropf 2020/12/01, 4:06 PM

## 2020-12-19 NOTE — Plan of Care (Signed)
Patient is comfort care, on scheduled dilaudid and Ativan. In no acute distress. Problem: Education: Goal: Knowledge of General Education information will improve Description: Including pain rating scale, medication(s)/side effects and non-pharmacologic comfort measures Outcome: Not Progressing   Problem: Health Behavior/Discharge Planning: Goal: Ability to manage health-related needs will improve Outcome: Not Progressing   Problem: Clinical Measurements: Goal: Ability to maintain clinical measurements within normal limits will improve Outcome: Not Progressing Goal: Will remain free from infection Outcome: Not Progressing Goal: Diagnostic test results will improve Outcome: Not Progressing Goal: Respiratory complications will improve Outcome: Not Progressing Goal: Cardiovascular complication will be avoided Outcome: Not Progressing   Problem: Activity: Goal: Risk for activity intolerance will decrease Outcome: Not Progressing   Problem: Nutrition: Goal: Adequate nutrition will be maintained Outcome: Not Progressing   Problem: Coping: Goal: Level of anxiety will decrease Outcome: Not Progressing   Problem: Elimination: Goal: Will not experience complications related to bowel motility Outcome: Not Progressing Goal: Will not experience complications related to urinary retention Outcome: Not Progressing   Problem: Pain Managment: Goal: General experience of comfort will improve Outcome: Not Progressing   Problem: Safety: Goal: Ability to remain free from injury will improve Outcome: Not Progressing   Problem: Skin Integrity: Goal: Risk for impaired skin integrity will decrease Outcome: Not Progressing   Problem: Nutrition Goal: Patient maintains adequate hydration Outcome: Not Progressing Goal: Patient maintains weight Outcome: Not Progressing Goal: Patient/Family demonstrates understanding of diet Outcome: Not Progressing Goal: Patient/Family independently  completes tube feeding Outcome: Not Progressing Goal: Patient will have no more than 5 lb weight change during LOS Outcome: Not Progressing Goal: Patient will utilize adaptive techniques to administer nutrition Outcome: Not Progressing Goal: Patient will verbalize dietary restrictions Outcome: Not Progressing

## 2020-12-19 NOTE — Progress Notes (Signed)
Pt's family noted this RN that they would not be coming to visit pt & that they had chosen a funeral home, [facility], and that we could go ahead and transport pt. Pt transported to morgue.

## 2020-12-19 NOTE — Progress Notes (Signed)
Pt passed away at 1330, this RN & Phil Dopp, RN pronounced death. Dr. Sharolyn Douglas notified. Son, Moxon Messler also notified, he stated they would be coming to see pt & that he would notify the rest of the family. [facility] called, they said he was a potential tissue donor, so eye prep was completed, reference number is [phone] & spoke with Luane School.

## 2020-12-19 DEATH — deceased

## 2022-07-24 IMAGING — DX DG ABD PORTABLE 1V
1 series · 1 of 1 positions shown · non-contrast
Comparison: None.

CLINICAL DATA: NG tube placement.

EXAM:
PORTABLE ABDOMEN - 1 VIEW

[abdomen]
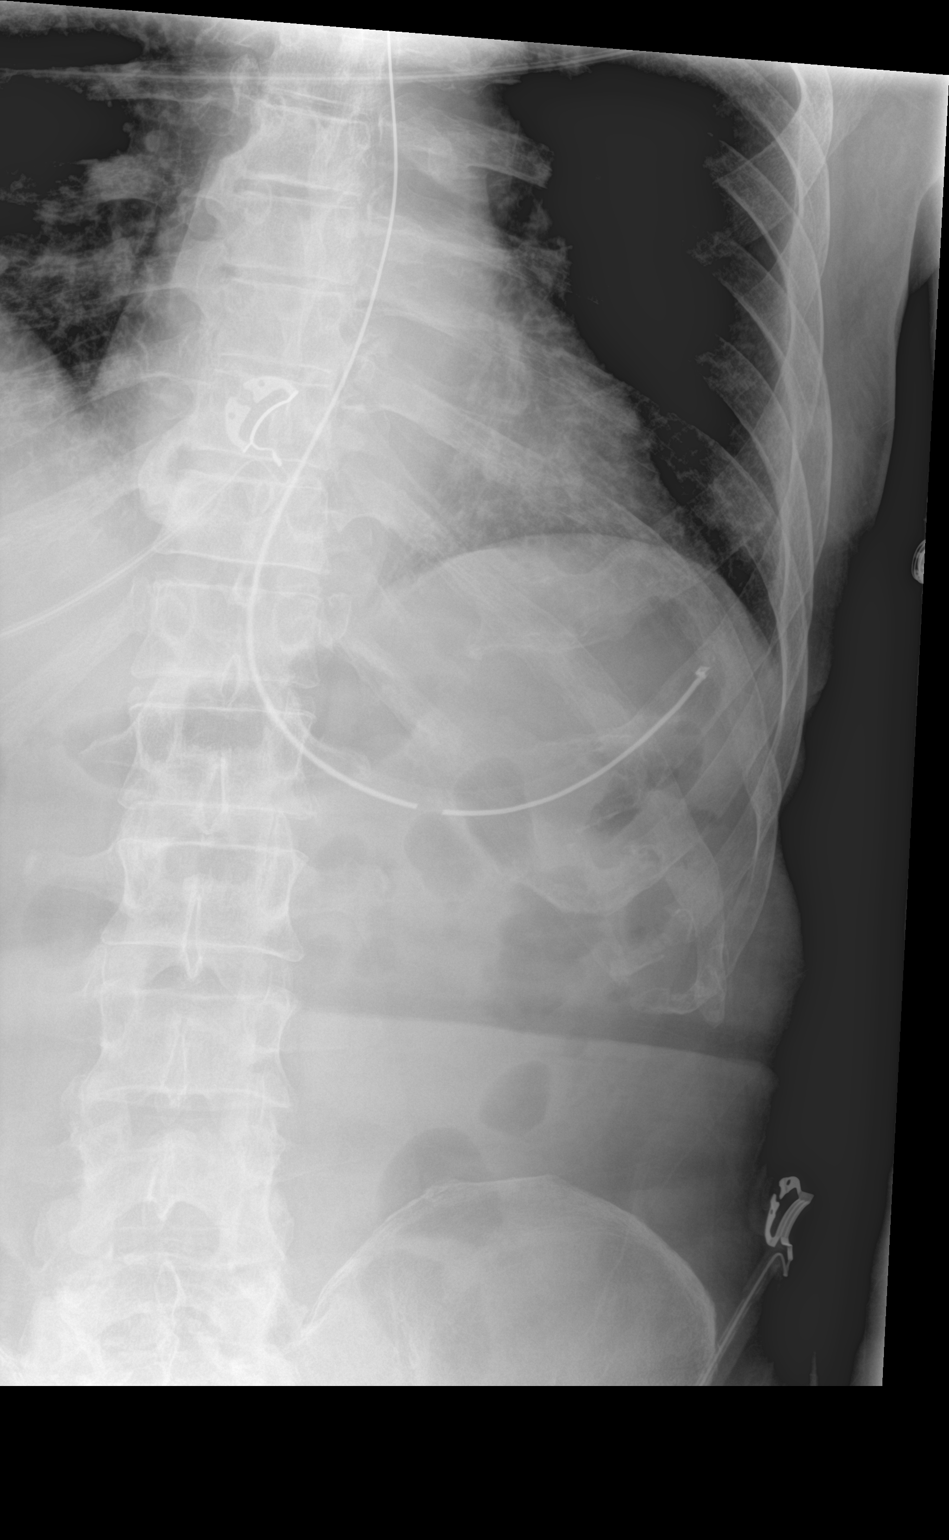

[1 of 1 positions shown; findings below may reference images not displayed]

FINDINGS: NG tube tip is in the stomach. Proximal side port of the NG tube is
below the GE junction.
IMPRESSION: NG tube tip is in the stomach.

## 2022-07-24 IMAGING — CT CT ABD-PELV W/O CM
2 of 4 series · 15 of 46 positions shown, 17 images · non-contrast
Comparison: None.

CLINICAL DATA: Sepsis

EXAM:
CT ABDOMEN AND PELVIS WITHOUT CONTRAST
TECHNIQUE: Multidetector CT imaging of the abdomen and pelvis was performed
following the standard protocol without IV contrast.

[Series 3: ap without (person_name) · axial · non-contrast · 0.64mm/px · z∈[-392,-2]mm · 12 of 91 slices shown, 14 images]
[im 7/91  soft-tissue]
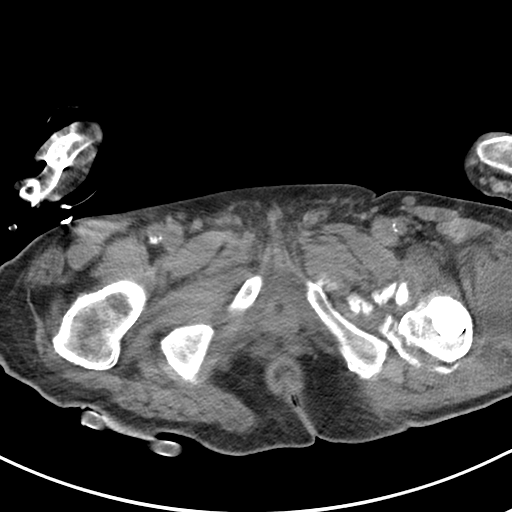
[im 7/91  bone]
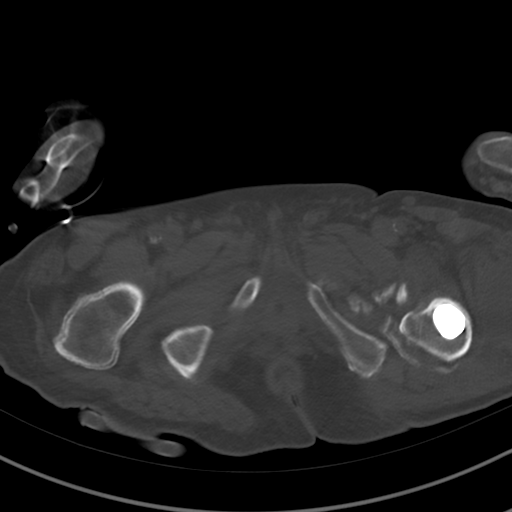
[im 13/91  soft-tissue]
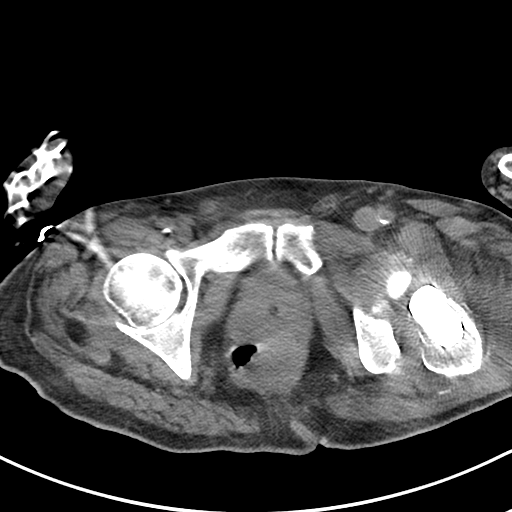
[im 19/91  soft-tissue]
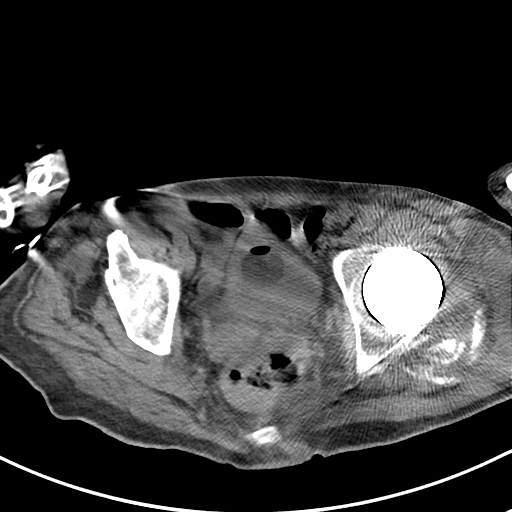
[im 31/91  soft-tissue]
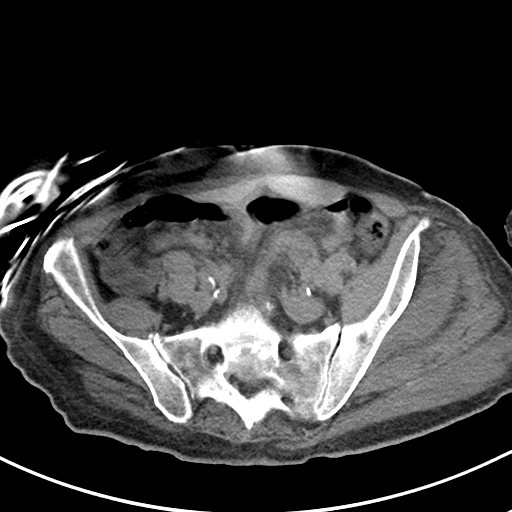
[im 37/91  soft-tissue]
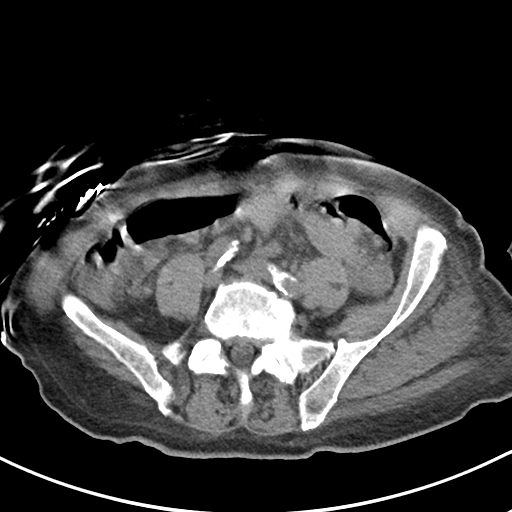
[im 43/91  soft-tissue]
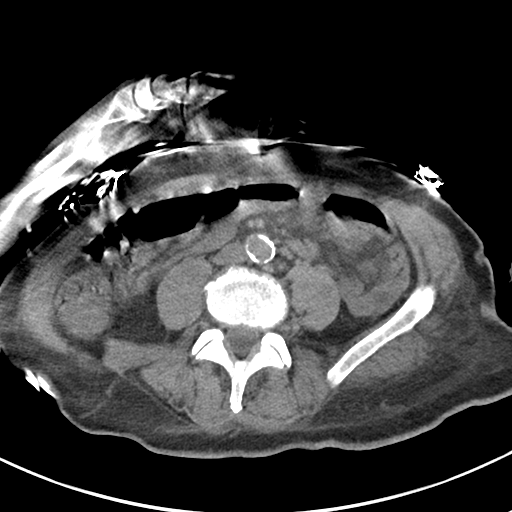
[im 49/91  soft-tissue]
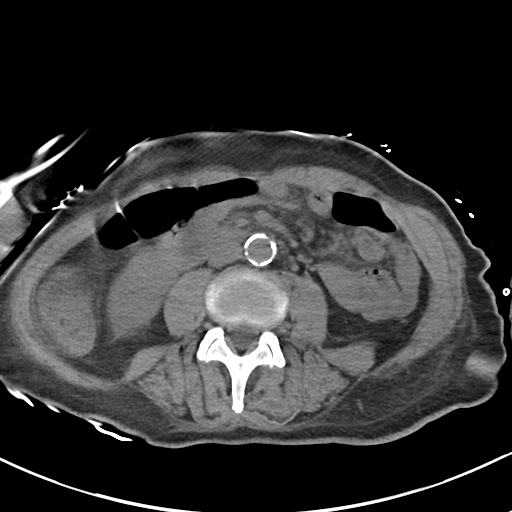
[im 55/91  soft-tissue]
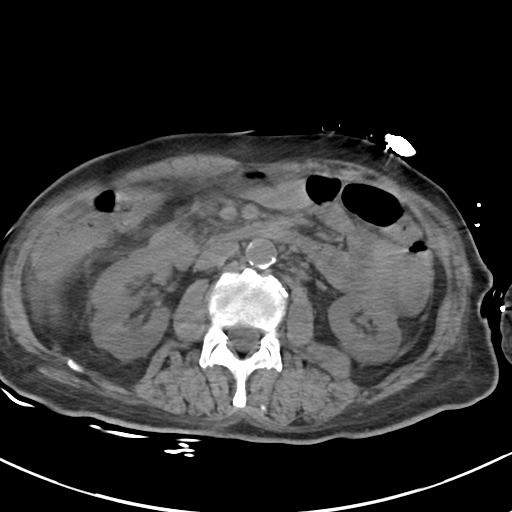
[im 61/91  soft-tissue]
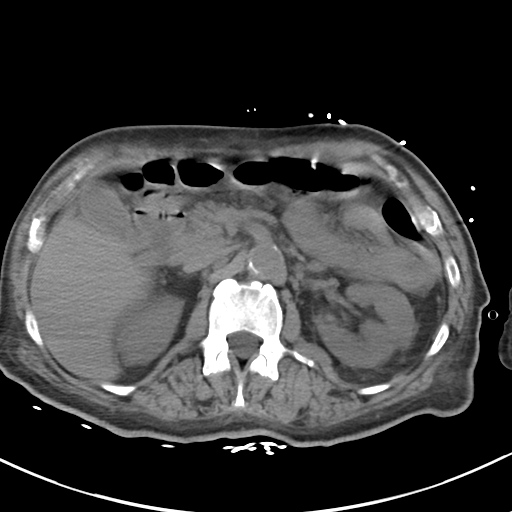
[im 61/91  bone]
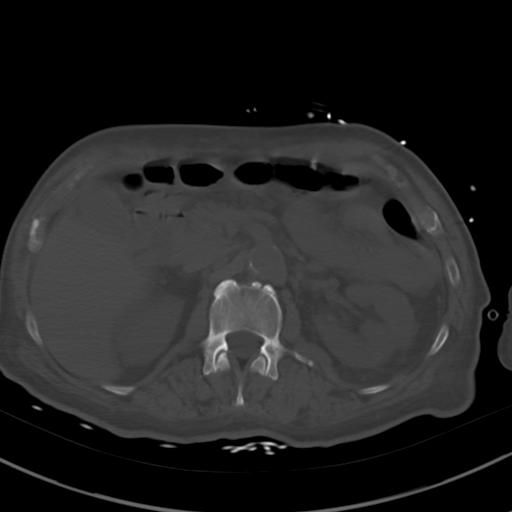
[im 73/91  soft-tissue]
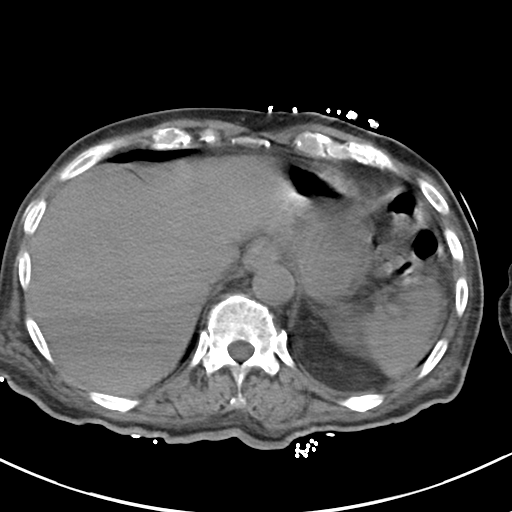
[im 79/91  soft-tissue]
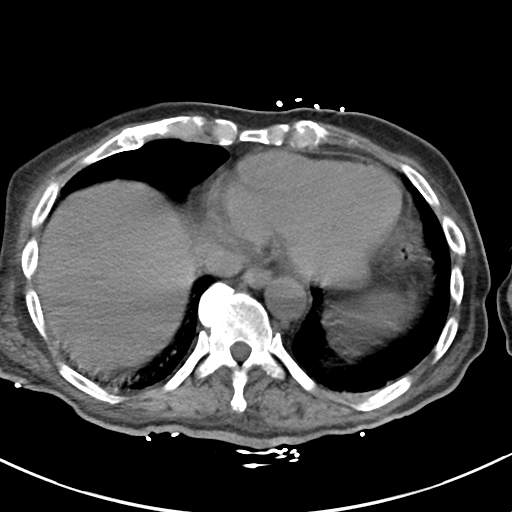
[im 85/91  soft-tissue]
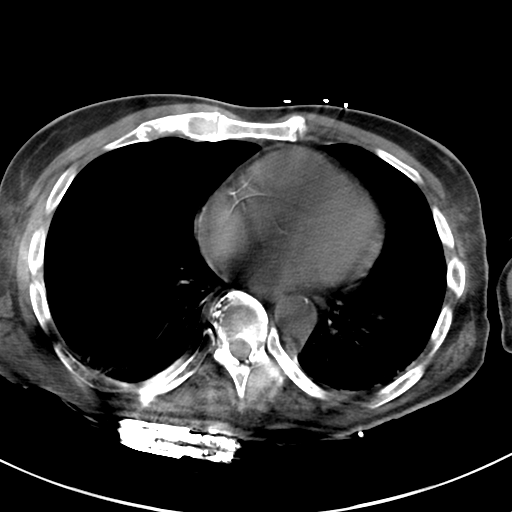

[Series 6: (person_name) · coronal · 0.66mm/px · 3 of 88 slices shown]
[im 30/88  soft-tissue]
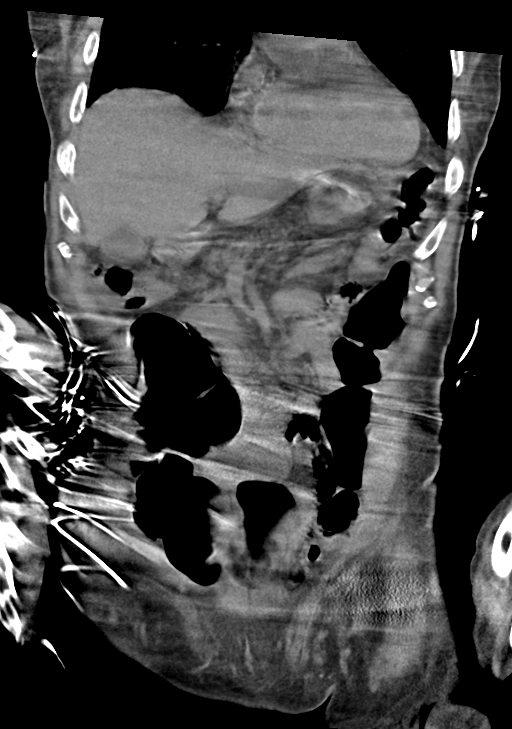
[im 39/88  soft-tissue]
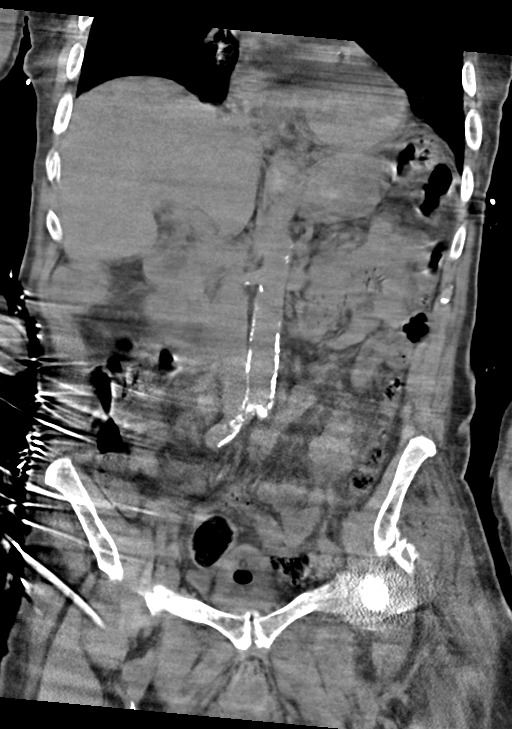
[im 49/88  soft-tissue]
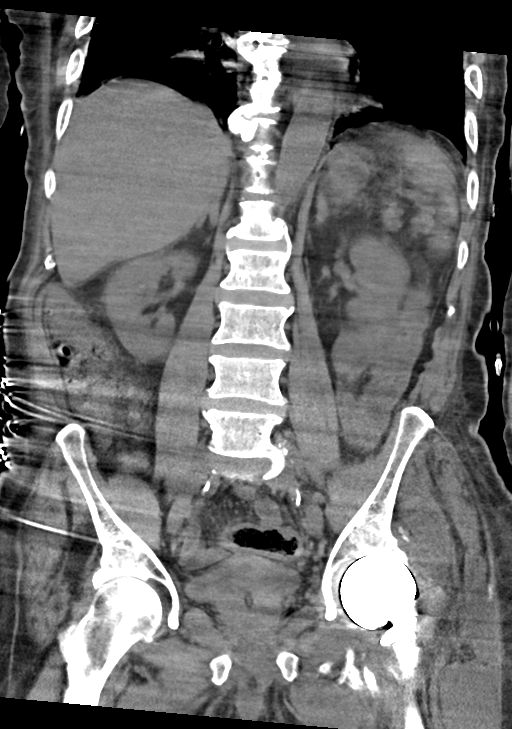

[15 of 46 positions shown; findings below may reference images not displayed]

FINDINGS: Lower chest: Mild bibasilar atelectasis.

Hepatobiliary: No focal liver abnormality is seen. Gallbladder is
unremarkable. No bile duct dilatation.

Pancreas: Partially infiltrated with fat but otherwise unremarkable.
No peripancreatic fluid.

Spleen: Unremarkable, although evaluation is limited by motion
artifact.

Adrenals/Urinary Tract: Adrenal glands are unremarkable. Kidneys are
unremarkable without stone or hydronephrosis. No perinephric fluid.
Bladder is decompressed by Foley catheter.

Stomach/Bowel: No dilated large or small bowel loops. No convincing
evidence of an acute bowel wall inflammation. Stomach is
unremarkable, partially decompressed.

Vascular/Lymphatic: Aortic atherosclerosis. No enlarged lymph nodes
are seen in the abdomen or pelvis.

Reproductive: Prostate is unremarkable.

Other: No free fluid or abscess collection is seen within the
abdomen or pelvis. No free intraperitoneal air.

Musculoskeletal: Degenerative spondylosis of the lumbar spine,
moderate in degree. No acute appearing osseous abnormality. LEFT hip
arthroplasty hardware in place. Prominent fluid-like collection
lateral to the LEFT hip arthroplasty hardware, incompletely imaged,
of uncertain age, containing small foci of air.
IMPRESSION: 1. Prominent fluid-like collection lateral to the LEFT hip
arthroplasty hardware, incompletely imaged, of uncertain age but
small foci of air are seen within the collection which could
indicate acute abscess. Alternatively, this could represent chronic
seroma or hematoma. Consider further characterization with
ultrasound.
2. No additional acute or significant findings within the abdomen or
pelvis. No bowel obstruction or evidence of an acute bowel wall
inflammation. No free fluid or abscess collection is seen within the
abdomen or pelvis. No evidence of acute solid organ abnormality. No
renal or ureteral stone.

Aortic Atherosclerosis ([id number]-[id number].E).
